# Patient Record
Sex: Female | Born: 1950 | Hispanic: Yes | Smoking: Never smoker
Health system: Southern US, Community
[De-identification: ages and names within clinical notes are randomized; demographics above are authoritative.]

## PROBLEM LIST (undated history)

## (undated) DIAGNOSIS — I5189 Other ill-defined heart diseases: Secondary | ICD-10-CM

## (undated) DIAGNOSIS — I209 Angina pectoris, unspecified: Secondary | ICD-10-CM

## (undated) DIAGNOSIS — J9601 Acute respiratory failure with hypoxia: Secondary | ICD-10-CM

## (undated) DIAGNOSIS — I2510 Atherosclerotic heart disease of native coronary artery without angina pectoris: Secondary | ICD-10-CM

## (undated) DIAGNOSIS — R011 Cardiac murmur, unspecified: Secondary | ICD-10-CM

## (undated) DIAGNOSIS — E785 Hyperlipidemia, unspecified: Secondary | ICD-10-CM

## (undated) DIAGNOSIS — Z87442 Personal history of urinary calculi: Secondary | ICD-10-CM

## (undated) DIAGNOSIS — J45909 Unspecified asthma, uncomplicated: Secondary | ICD-10-CM

## (undated) DIAGNOSIS — G894 Chronic pain syndrome: Secondary | ICD-10-CM

## (undated) DIAGNOSIS — I272 Pulmonary hypertension, unspecified: Secondary | ICD-10-CM

## (undated) DIAGNOSIS — I259 Chronic ischemic heart disease, unspecified: Secondary | ICD-10-CM

## (undated) DIAGNOSIS — R160 Hepatomegaly, not elsewhere classified: Secondary | ICD-10-CM

## (undated) DIAGNOSIS — I38 Endocarditis, valve unspecified: Secondary | ICD-10-CM

## (undated) DIAGNOSIS — M329 Systemic lupus erythematosus, unspecified: Secondary | ICD-10-CM

## (undated) DIAGNOSIS — IMO0002 Reserved for concepts with insufficient information to code with codable children: Secondary | ICD-10-CM

## (undated) DIAGNOSIS — Z8619 Personal history of other infectious and parasitic diseases: Secondary | ICD-10-CM

## (undated) DIAGNOSIS — M810 Age-related osteoporosis without current pathological fracture: Secondary | ICD-10-CM

## (undated) DIAGNOSIS — T402X1A Poisoning by other opioids, accidental (unintentional), initial encounter: Secondary | ICD-10-CM

## (undated) DIAGNOSIS — Z9884 Bariatric surgery status: Secondary | ICD-10-CM

## (undated) DIAGNOSIS — R0600 Dyspnea, unspecified: Secondary | ICD-10-CM

## (undated) DIAGNOSIS — M5010 Cervical disc disorder with radiculopathy, unspecified cervical region: Secondary | ICD-10-CM

## (undated) DIAGNOSIS — I240 Acute coronary thrombosis not resulting in myocardial infarction: Secondary | ICD-10-CM

## (undated) DIAGNOSIS — G709 Myoneural disorder, unspecified: Secondary | ICD-10-CM

## (undated) DIAGNOSIS — R42 Dizziness and giddiness: Secondary | ICD-10-CM

## (undated) DIAGNOSIS — J449 Chronic obstructive pulmonary disease, unspecified: Secondary | ICD-10-CM

## (undated) DIAGNOSIS — I2089 Other forms of angina pectoris: Secondary | ICD-10-CM

## (undated) DIAGNOSIS — I6789 Other cerebrovascular disease: Secondary | ICD-10-CM

## (undated) DIAGNOSIS — M359 Systemic involvement of connective tissue, unspecified: Secondary | ICD-10-CM

## (undated) DIAGNOSIS — K449 Diaphragmatic hernia without obstruction or gangrene: Secondary | ICD-10-CM

## (undated) DIAGNOSIS — G4700 Insomnia, unspecified: Secondary | ICD-10-CM

## (undated) DIAGNOSIS — F32A Depression, unspecified: Secondary | ICD-10-CM

## (undated) DIAGNOSIS — M1990 Unspecified osteoarthritis, unspecified site: Secondary | ICD-10-CM

## (undated) DIAGNOSIS — F329 Major depressive disorder, single episode, unspecified: Secondary | ICD-10-CM

## (undated) DIAGNOSIS — K219 Gastro-esophageal reflux disease without esophagitis: Secondary | ICD-10-CM

## (undated) DIAGNOSIS — E559 Vitamin D deficiency, unspecified: Secondary | ICD-10-CM

## (undated) DIAGNOSIS — E039 Hypothyroidism, unspecified: Secondary | ICD-10-CM

## (undated) DIAGNOSIS — D509 Iron deficiency anemia, unspecified: Secondary | ICD-10-CM

## (undated) DIAGNOSIS — M5136 Other intervertebral disc degeneration, lumbar region: Secondary | ICD-10-CM

## (undated) DIAGNOSIS — Z8616 Personal history of COVID-19: Secondary | ICD-10-CM

## (undated) DIAGNOSIS — I10 Essential (primary) hypertension: Secondary | ICD-10-CM

## (undated) DIAGNOSIS — G4730 Sleep apnea, unspecified: Secondary | ICD-10-CM

## (undated) DIAGNOSIS — N289 Disorder of kidney and ureter, unspecified: Secondary | ICD-10-CM

## (undated) DIAGNOSIS — E079 Disorder of thyroid, unspecified: Secondary | ICD-10-CM

## (undated) HISTORY — PX: APPENDECTOMY: SHX54

## (undated) HISTORY — PX: NISSEN FUNDOPLICATION: SHX2091

## (undated) HISTORY — PX: CORONARY ANGIOPLASTY WITH STENT PLACEMENT: SHX49

## (undated) HISTORY — PX: LITHOTRIPSY: SUR834

## (undated) HISTORY — PX: TUBAL LIGATION: SHX77

## (undated) HISTORY — PX: GASTRIC BYPASS: SHX52

## (undated) HISTORY — PX: BREAST BIOPSY: SHX20

## (undated) HISTORY — PX: BREAST LUMPECTOMY: SHX2

---

## 2016-01-27 ENCOUNTER — Emergency Department
Admission: EM | Admit: 2016-01-27 | Discharge: 2016-01-29 | Disposition: A | Discharge status: Home / Self Care | Payer: Medicaid Other | Attending: Emergency Medicine | Admitting: Emergency Medicine

## 2016-01-27 DIAGNOSIS — F329 Major depressive disorder, single episode, unspecified: Secondary | ICD-10-CM

## 2016-01-27 DIAGNOSIS — Z5181 Encounter for therapeutic drug level monitoring: Secondary | ICD-10-CM | POA: Insufficient documentation

## 2016-01-27 DIAGNOSIS — F29 Unspecified psychosis not due to a substance or known physiological condition: Secondary | ICD-10-CM | POA: Diagnosis not present

## 2016-01-27 DIAGNOSIS — F341 Dysthymic disorder: Secondary | ICD-10-CM | POA: Insufficient documentation

## 2016-01-27 DIAGNOSIS — F32A Depression, unspecified: Secondary | ICD-10-CM

## 2016-01-27 LAB — CBC WITH DIFFERENTIAL/PLATELET
Basophils Absolute: 0.1 10*3/uL (ref 0–0.1)
Basophils Relative: 1 %
Eosinophils Absolute: 0.1 10*3/uL (ref 0–0.7)
Eosinophils Relative: 2 %
HCT: 36.5 % (ref 35.0–47.0)
Hemoglobin: 12.3 g/dL (ref 12.0–16.0)
Lymphocytes Relative: 37 %
Lymphs Abs: 2.5 10*3/uL (ref 1.0–3.6)
MCH: 28.7 pg (ref 26.0–34.0)
MCHC: 33.9 g/dL (ref 32.0–36.0)
MCV: 84.8 fL (ref 80.0–100.0)
Monocytes Absolute: 0.3 10*3/uL (ref 0.2–0.9)
Monocytes Relative: 5 %
Neutro Abs: 3.9 10*3/uL (ref 1.4–6.5)
Neutrophils Relative %: 55 %
Platelets: 177 10*3/uL (ref 150–440)
RBC: 4.3 MIL/uL (ref 3.80–5.20)
RDW: 15.1 % — ABNORMAL HIGH (ref 11.5–14.5)
WBC: 7 10*3/uL (ref 3.6–11.0)

## 2016-01-27 MED ORDER — LORAZEPAM 2 MG/ML IJ SOLN
1.0000 mg | Freq: Once | INTRAMUSCULAR | Status: AC
  Administered 2016-01-27: 1 mg via INTRAVENOUS
  Filled 2016-01-27: qty 1
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
## 2016-01-27 NOTE — ED Triage Notes (Signed)
Interpreter at bedside. Pt presents to ED from home via EMS for excessive crying. Pt states her son is in [place] (he is doing fine), but she wants to go with "him"; has been crying ever since.

## 2016-01-27 NOTE — ED Notes (Signed)
Unable to assess patient at this time.  Interpreter present, but pt refusing to answer questions.  Pt just crying and screaming about missing her son.

## 2016-01-27 NOTE — ED Notes (Signed)
Interpreter at bedside. Pt states she has been crying since her granddaughter left her a "long time ago".

## 2016-01-28 LAB — COMPREHENSIVE METABOLIC PANEL
ALT: 13 U/L — ABNORMAL LOW (ref 14–54)
AST: 21 U/L (ref 15–41)
Albumin: 3.7 g/dL (ref 3.5–5.0)
Alkaline Phosphatase: 79 U/L (ref 38–126)
Anion gap: 6 (ref 5–15)
BUN: 14 mg/dL (ref 6–20)
CO2: 25 mmol/L (ref 22–32)
Calcium: 8.9 mg/dL (ref 8.9–10.3)
Chloride: 111 mmol/L (ref 101–111)
Creatinine, Ser: 0.55 mg/dL (ref 0.44–1.00)
GFR calc Af Amer: >60 mL/min (ref >60)
GFR calc non Af Amer: >60 mL/min (ref >60)
Glucose, Bld: 93 mg/dL (ref 65–99)
Potassium: 3.1 mmol/L — ABNORMAL LOW (ref 3.5–5.1)
Sodium: 142 mmol/L (ref 135–145)
Total Bilirubin: 0.6 mg/dL (ref 0.3–1.2)
Total Protein: 6.5 g/dL (ref 6.5–8.1)

## 2016-01-28 LAB — URINALYSIS COMPLETE WITH MICROSCOPIC (ARMC ONLY)
Bacteria, UA: NONE SEEN
Bilirubin Urine: NEGATIVE
Glucose, UA: NEGATIVE mg/dL
Hgb urine dipstick: NEGATIVE
Leukocytes, UA: NEGATIVE
Nitrite: NEGATIVE
Protein, ur: NEGATIVE mg/dL
Specific Gravity, Urine: 1.012 (ref 1.005–1.030)
pH: 6 (ref 5.0–8.0)

## 2016-01-28 LAB — URINE DRUG SCREEN, QUALITATIVE (ARMC ONLY)
Amphetamines, Ur Screen: NOT DETECTED
Barbiturates, Ur Screen: NOT DETECTED
Benzodiazepine, Ur Scrn: POSITIVE — AB
Cannabinoid 50 Ng, Ur ~~LOC~~: NOT DETECTED
Cocaine Metabolite,Ur ~~LOC~~: NOT DETECTED
MDMA (Ecstasy)Ur Screen: NOT DETECTED
Methadone Scn, Ur: NOT DETECTED
Opiate, Ur Screen: NOT DETECTED
Phencyclidine (PCP) Ur S: NOT DETECTED
Tricyclic, Ur Screen: NOT DETECTED

## 2016-01-28 LAB — ETHANOL: Alcohol, Ethyl (B): <5 mg/dL (ref <5)

## 2016-01-28 LAB — ACETAMINOPHEN LEVEL: Acetaminophen (Tylenol), Serum: <10 ug/mL — ABNORMAL LOW (ref 10–30)

## 2016-01-28 LAB — SALICYLATE LEVEL: Salicylate Lvl: <4 mg/dL (ref 2.8–30.0)

## 2016-01-28 MED ORDER — IBUPROFEN 800 MG PO TABS
ORAL_TABLET | ORAL | Status: AC
  Administered 2016-01-28: 800 mg via ORAL
  Filled 2016-01-28: qty 1

## 2016-01-28 MED ORDER — IBUPROFEN 800 MG PO TABS
800.0000 mg | ORAL_TABLET | Freq: Once | ORAL | Status: AC
  Administered 2016-01-28: 800 mg via ORAL

## 2016-01-28 MED ORDER — LEVOTHYROXINE SODIUM 50 MCG PO TABS: 75.0000 ug | ORAL_TABLET | Freq: Every day | ORAL | Status: DC

## 2016-01-28 MED ORDER — OXYCODONE-ACETAMINOPHEN 5-325 MG PO TABS
2.0000 | ORAL_TABLET | Freq: Once | ORAL | Status: AC
  Administered 2016-01-28: 2 via ORAL
  Filled 2016-01-28: qty 2

## 2016-01-28 MED ORDER — AMLODIPINE BESYLATE 5 MG PO TABS
5.0000 mg | ORAL_TABLET | Freq: Every day | ORAL | Status: DC
  Administered 2016-01-28: 5 mg via ORAL
  Filled 2016-01-28: qty 1

## 2016-01-28 MED ORDER — LORAZEPAM 2 MG PO TABS
2.0000 mg | ORAL_TABLET | Freq: Once | ORAL | Status: AC
  Administered 2016-01-28: 2 mg via ORAL
  Filled 2016-01-28: qty 1

## 2016-01-28 MED ORDER — GABAPENTIN 400 MG PO CAPS
800.0000 mg | ORAL_CAPSULE | Freq: Two times a day (BID) | ORAL | Status: DC
  Administered 2016-01-28 (×2): 800 mg via ORAL
  Filled 2016-01-28 (×3): qty 2

## 2016-01-28 MED ORDER — GABAPENTIN 800 MG PO TABS
800.0000 mg | ORAL_TABLET | Freq: Two times a day (BID) | ORAL | Status: DC
  Filled 2016-01-28 (×2): qty 1

## 2016-01-28 MED ORDER — HYDROXYCHLOROQUINE SULFATE 200 MG PO TABS
200.0000 mg | ORAL_TABLET | Freq: Two times a day (BID) | ORAL | Status: DC
  Administered 2016-01-28 (×2): 200 mg via ORAL
  Filled 2016-01-28 (×4): qty 1

## 2016-01-28 MED ORDER — LORAZEPAM 1 MG PO TABS
1.0000 mg | ORAL_TABLET | Freq: Once | ORAL | Status: AC
  Administered 2016-01-28: 1 mg via ORAL
  Filled 2016-01-28: qty 1
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
## 2016-01-28 NOTE — ED Notes (Signed)
Called [facility] for consult spoke to [facility]

## 2016-01-28 NOTE — ED Notes (Signed)
Jackie Plum [phone] is the granddaughter of the patient. Would like to be updated about patient when possible or called if patient needs anything.
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
## 2016-01-28 NOTE — ED Notes (Signed)
ENVIRONMENTAL ASSESSMENT  Potentially harmful objects out of patient reach: Yes.  Personal belongings secured: Yes.  Patient dressed in hospital provided attire only: Yes.  Plastic bags out of patient reach: Yes.  Patient care equipment (cords, cables, call bells, lines, and drains) shortened, removed, or accounted for: Yes.  Equipment and supplies removed from bottom of stretcher: Yes.  Potentially toxic materials out of patient reach: Yes.  Sharps container removed or out of patient reach: Yes.   BEHAVIORAL HEALTH ROUNDING  Patient sleeping: No.  Patient alert and oriented: yes  Behavior appropriate: Yes. ; If no, describe:  Nutrition and fluids offered: Yes  Toileting and hygiene offered: Yes  Sitter present: not applicable, Q 15 min safety rounds and observation.  Law enforcement present: Yes ODS  ED [place]  Is the patient under IVC or is there intent for IVC: Yes.  Is the patient medically cleared: Yes.  Is there vacancy in the ED BHU: Yes.  Is the population mix appropriate for patient: Yes.  Is the patient awaiting placement in inpatient or outpatient setting: Yes.  Has the patient had a psychiatric consult: Pending Survey of unit performed for contraband, proper placement and condition of furniture, tampering with fixtures in bathroom, shower, and each patient room: Yes. ; Findings: All clear  APPEARANCE/BEHAVIOR  calm, cooperative and adequate rapport can be established  NEURO ASSESSMENT  Orientation: time, place and person  Hallucinations: No.None noted (Hallucinations)  Speech: Normal  Gait: normal  RESPIRATORY ASSESSMENT  WNL  CARDIOVASCULAR ASSESSMENT  WNL  GASTROINTESTINAL ASSESSMENT  WNL  EXTREMITIES  WNL  PLAN OF CARE  Provide calm/safe environment. Vital signs assessed twice daily. ED BHU Assessment once each 12-hour shift. Collaborate with TTS daily or as condition indicates. Assure the ED provider has rounded once each shift. Provide  and encourage hygiene. Provide redirection as needed. Assess for escalating behavior; address immediately and inform ED provider.  Assess family dynamic and appropriateness for visitation as needed: Yes. ; If necessary, describe findings:  Educate the patient/family about BHU procedures/visitation: Yes. ; If necessary, describe findings: Pt is calm and cooperative at this time. Pt understanding and accepting of unit procedures/rules. Will continue to monitor with Q 15 min safety rounds and observation.

## 2016-01-28 NOTE — ED Notes (Signed)
Pt dressed out into hospital scrubs.
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
## 2016-01-28 NOTE — ED Notes (Signed)
BEHAVIORAL HEALTH ROUNDING  Patient sleeping: Yes Patient alert and oriented: Sleeping Behavior appropriate: Yes. ; If no, describe:  Nutrition and fluids offered: No, sleeping  Toileting and hygiene offered: No, sleeping  Sitter present: q15 minute observations and security monitoring  Law enforcement present: Yes ODS 

## 2016-01-28 NOTE — BH Assessment (Signed)
Contacted [facility] to perform tele-assessment. Mila Merry said Pt is too drowsy to participate in assessment.   Orpah Greek Anson Fret, LPC, [facility] Triage Specialist [phone]

## 2016-01-28 NOTE — ED Notes (Signed)
Pt ambulated to bathroom with walker and tech assistance. Urine collected pt and pt back to bed.

## 2016-01-28 NOTE — ED Notes (Signed)
Granddaughter Stanton Kidney called checking on patient informed her that we did not have her contact number.  Obtained contact info and notified SW.  Phone is [phone]

## 2016-01-28 NOTE — ED Notes (Addendum)
Pt continues to be tearful throughout the day.  Pt upset and wants to be with her son instead of her 62 year old granddaughter who just had a child. Pt also reports that she has applied for housing for herself. Pt rolled to room 36 for consult at this time on the computer screen.

## 2016-01-28 NOTE — ED Notes (Signed)
Pt given lunch tray.

## 2016-01-28 NOTE — ED Notes (Signed)
Pt continues to be tearful while lying on stretcher.  Discussed with Dr. Reita Cliche that medications have been reconciled in computer.  New order given for Ativan 1mg  PO at this time for anxiety.

## 2016-01-28 NOTE — ED Notes (Signed)
Pt is resting on stretcher in hallway.  Respirations equal and unlabored at this time.

## 2016-01-28 NOTE — ED Provider Notes (Signed)
-----------------------------------------   6:17 AM on 01/28/2016 -----------------------------------------   Blood pressure 134/74, pulse (!) 105, temperature 98.2 F (36.8 C), temperature source Oral, SpO2 100 %.  The patient had no acute events since last update.  Calm and cooperative at this time.  Disposition is pending Psychiatry/Behavioral Medicine team recommendations.     Paulette Blanch, MD 01/28/16 [phone]

## 2016-01-28 NOTE — ED Notes (Signed)
TTS on the computer screen at this time.
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
## 2016-01-28 NOTE — Clinical Social Work Note (Signed)
Clinical Social Work Assessment  Patient Details  Name: Shelly Freeman MRN: [id number] Date of Birth: Jan 11, 1951  Date of referral:  01/28/16               Reason for consult:  Discharge Planning, Emotional/Coping/Adjustment to Illness                Permission sought to share information with:  Facility Sport and exercise psychologist, Family Supports Permission granted to share information::  Yes, Verbal Permission Granted  Name::        Agency::     Relationship::     Contact Information:  Grand daughter [name]- Phone number unknown  Housing/Transportation Living arrangements for the past 2 months:  [place] (With Granddaughter) Source of Information:  Patient Patient Interpreter Needed:  Spanish Criminal Activity/Legal Involvement Pertinent to Current Situation/Hospitalization:  No - Comment as needed Significant Relationships:  Adult Children, Other Family Members Lives with:  Relatives Do you feel safe going back to the place where you live?  Yes Need for family participation in patient care:  Yes (Comment)  Care giving concerns:  TBD unable to reach daughter   Facilities manager / plan: LCSW met with ED nurse and Interpreter and collected information to complete a SW assessment. We were attempting to determine if patient is psychiatric or medical issues. It appears she does have some anxiety but reports she is not homicidal or suicidal. She was in [place] and moved to [facility] and now resides here in [place] for the last 2 months.. She has medicaid and has applied for housing. She is living with her grand daughter [name] unable to reach her, she reports she yells at her but there are no protection issues. Her only living son is in [place]. Patient is IVC yet its her first visit at this hospital. She went missing a week ago. This was not clear to all the parties involved. Patient has [place] medicaid and has applied for housing at [place] but does live with her granddaughter. She  would love to live in [place] with her only son. Support was provided and she will need a psychiatric assessment. She was IVC and brought in by police.  Employment status:  Retired Forensic scientist:  [place] PT Recommendations:  No Follow Up Information / Referral to community resources:  Other (Comment Required), Outpatient Psychiatric Care (Comment Required)  Patient/Family's Response to care:  TBD  Patient/Family's Understanding of and Emotional Response to Diagnosis, Current Treatment, and Prognosis: TBD  Emotional Assessment Appearance:  Appears stated age Attitude/Demeanor/Rapport:  Crying, Grieving Affect (typically observed):  Grieving, Apprehensive, Tearful/Crying Orientation:  Oriented to Self, Oriented to Place Alcohol / Substance use:  Never Used Psych involvement (Current and /or in the community):  Yes (Comment) (for her nerves)  Discharge Needs  Concerns to be addressed:  Coping/Stress Concerns, Discharge Planning Concerns Readmission within the last 30 days:    Current discharge risk:  Psychiatric Illness, Lack of support system Barriers to Discharge:  Family Issues, Continued Medical Work up   [place], LCSW 01/28/2016, 12:11 PM

## 2016-01-28 NOTE — Progress Notes (Signed)
LCSW met with ED nurse and Interpreter and collected information to complete a SW assessment. We were attempting to determine if patient is psychiatric or medical issues. It appears she does have some anxiety but reports she is not homicidal or suicidal. She was in [place] and moved to [facility] and now resides here in [name]. She has medicaid and has applied for housing. She is living with her grand daughter [name] unable to reach her, she reports she yells at her but there are no protection issues. Her only living son is in [place]. Patient is IVC yet its her first visit at this hospital. She went missing a week ago-  ED nurse will consult with EDP and go from there.  [employer] LCSW [phone]

## 2016-01-29 MED ORDER — TRAZODONE HCL 50 MG PO TABS: 50.0000 mg | ORAL_TABLET | Freq: Every day | ORAL | 0 refills | Status: DC

## 2016-01-29 MED ORDER — BUPROPION HCL ER (XL) 150 MG PO TB24: 150.0000 mg | ORAL_TABLET | Freq: Every day | ORAL | 0 refills | Status: DC

## 2016-01-29 NOTE — Discharge Instructions (Signed)
1. The psychiatrist recommends that you start the following medications: Trazodone 50 mg by mouth nightly Wellbutrin XL 150 mg by mouth every morning 2. Return to the ER for worsening symptoms, feelings of hurting yourself or others, or other concerns.

## 2016-01-29 NOTE — ED Notes (Signed)
Interpreter Rafel in room with pt for [facility] consult.

## 2016-01-29 NOTE — ED Notes (Signed)
BEHAVIORAL HEALTH ROUNDING  Patient sleeping: No.  Patient alert and oriented: yes  Behavior appropriate: Yes. ; If no, describe:  Nutrition and fluids offered: Yes  Toileting and hygiene offered: Yes  Sitter present: not applicable, Q 15 min safety rounds and observation.  Law enforcement present: Yes ODS  

## 2016-01-29 NOTE — ED Notes (Signed)
Discharge instructions given via stick with [place] [id number].

## 2016-01-29 NOTE — ED Notes (Signed)
Taxi called to take patient home. Granddaughter called to inform of this and pt discharge.

## 2016-01-29 NOTE — ED Notes (Signed)
BEHAVIORAL HEALTH ROUNDING Patient sleeping: Yes.   Patient alert and oriented: not applicable SLEEPING Behavior appropriate: Yes.  ; If no, describe: SLEEPING Nutrition and fluids offered: No SLEEPING Toileting and hygiene offered: NoSLEEPING Sitter present: not applicable, Q 15 min safety rounds and observation. Law enforcement present: Yes ODS 

## 2016-01-29 NOTE — ED Provider Notes (Signed)
-----------------------------------------   2:35 AM on 01/29/2016 -----------------------------------------  Patient was evaluated by [facility] psychiatry who has rescinded her IVC. Deems the patient is psychiatrically stable for discharge to home with follow-up to an outpatient psychiatrist. He has asked me to provide a two-week prescription of trazodone 50 mg at night and Wellbutrin XL 150 mg in the morning. Strict return precautions given. Patient verbalizes understanding and agrees with plan of care   Paulette Blanch, MD 01/29/16 [phone]

## 2016-02-02 ENCOUNTER — Emergency Department
Admission: EM | Admit: 2016-02-02 | Discharge: 2016-02-02 | Disposition: A | Discharge status: Home / Self Care | Payer: Medicaid Other | Attending: Emergency Medicine | Admitting: Emergency Medicine

## 2016-02-02 ENCOUNTER — Encounter: Payer: Self-pay | Admitting: Emergency Medicine

## 2016-02-02 DIAGNOSIS — R112 Nausea with vomiting, unspecified: Secondary | ICD-10-CM | POA: Insufficient documentation

## 2016-02-02 DIAGNOSIS — Z79899 Other long term (current) drug therapy: Secondary | ICD-10-CM | POA: Insufficient documentation

## 2016-02-02 DIAGNOSIS — I10 Essential (primary) hypertension: Secondary | ICD-10-CM | POA: Insufficient documentation

## 2016-02-02 HISTORY — DX: Depression, unspecified: F32.A

## 2016-02-02 HISTORY — DX: Disorder of thyroid, unspecified: E07.9

## 2016-02-02 HISTORY — DX: Major depressive disorder, single episode, unspecified: F32.9

## 2016-02-02 HISTORY — DX: Reserved for concepts with insufficient information to code with codable children: IMO0002

## 2016-02-02 HISTORY — DX: Endocarditis, valve unspecified: I38

## 2016-02-02 HISTORY — DX: Essential (primary) hypertension: I10

## 2016-02-02 HISTORY — DX: Systemic lupus erythematosus, unspecified: M32.9

## 2016-02-02 HISTORY — DX: Unspecified osteoarthritis, unspecified site: M19.90

## 2016-02-02 LAB — COMPREHENSIVE METABOLIC PANEL
ALT: 13 U/L — ABNORMAL LOW (ref 14–54)
AST: 19 U/L (ref 15–41)
Albumin: 4 g/dL (ref 3.5–5.0)
Alkaline Phosphatase: 90 U/L (ref 38–126)
Anion gap: 5 (ref 5–15)
BUN: 17 mg/dL (ref 6–20)
CO2: 27 mmol/L (ref 22–32)
Calcium: 8.9 mg/dL (ref 8.9–10.3)
Chloride: 108 mmol/L (ref 101–111)
Creatinine, Ser: 0.66 mg/dL (ref 0.44–1.00)
GFR calc Af Amer: >60 mL/min (ref >60)
GFR calc non Af Amer: >60 mL/min (ref >60)
Glucose, Bld: 92 mg/dL (ref 65–99)
Potassium: 4.2 mmol/L (ref 3.5–5.1)
Sodium: 140 mmol/L (ref 135–145)
Total Bilirubin: 0.5 mg/dL (ref 0.3–1.2)
Total Protein: 6.8 g/dL (ref 6.5–8.1)

## 2016-02-02 LAB — URINALYSIS COMPLETE WITH MICROSCOPIC (ARMC ONLY)
Bacteria, UA: NONE SEEN
Bilirubin Urine: NEGATIVE
Glucose, UA: NEGATIVE mg/dL
Hgb urine dipstick: NEGATIVE
Ketones, ur: NEGATIVE mg/dL
Leukocytes, UA: NEGATIVE
Nitrite: NEGATIVE
Protein, ur: NEGATIVE mg/dL
Specific Gravity, Urine: 1.023 (ref 1.005–1.030)
pH: 5 (ref 5.0–8.0)

## 2016-02-02 LAB — CBC
HCT: 37.3 % (ref 35.0–47.0)
Hemoglobin: 12.6 g/dL (ref 12.0–16.0)
MCH: 28.8 pg (ref 26.0–34.0)
MCHC: 33.9 g/dL (ref 32.0–36.0)
MCV: 85.1 fL (ref 80.0–100.0)
Platelets: 168 10*3/uL (ref 150–440)
RBC: 4.38 MIL/uL (ref 3.80–5.20)
RDW: 15.4 % — ABNORMAL HIGH (ref 11.5–14.5)
WBC: 6.3 10*3/uL (ref 3.6–11.0)

## 2016-02-02 LAB — INFLUENZA PANEL BY PCR (TYPE A & B)
H1N1 flu by pcr: NOT DETECTED
Influenza A By PCR: NEGATIVE
Influenza B By PCR: NEGATIVE

## 2016-02-02 LAB — TROPONIN I: Troponin I: <0.03 ng/mL (ref <0.03)

## 2016-02-02 LAB — LIPASE, BLOOD: Lipase: 23 U/L (ref 11–51)

## 2016-02-02 MED ORDER — ONDANSETRON 4 MG PO TBDP
ORAL_TABLET | ORAL | Status: AC
  Filled 2016-02-02: qty 1

## 2016-02-02 MED ORDER — ONDANSETRON HCL 4 MG PO TABS: 4.0000 mg | ORAL_TABLET | Freq: Three times a day (TID) | ORAL | 0 refills | Status: DC | PRN

## 2016-02-02 MED ORDER — IBUPROFEN 400 MG PO TABS
600.0000 mg | ORAL_TABLET | Freq: Once | ORAL | Status: AC
  Administered 2016-02-02: 600 mg via ORAL
  Filled 2016-02-02: qty 2

## 2016-02-02 MED ORDER — SODIUM CHLORIDE 0.9 % IV BOLUS (SEPSIS)
1000.0000 mL | Freq: Once | INTRAVENOUS | Status: AC
  Administered 2016-02-02: 1000 mL via INTRAVENOUS

## 2016-02-02 MED ORDER — ONDANSETRON HCL 4 MG/2ML IJ SOLN
4.0000 mg | Freq: Once | INTRAMUSCULAR | Status: AC
  Administered 2016-02-02: 4 mg via INTRAVENOUS
  Filled 2016-02-02: qty 2

## 2016-02-02 MED ORDER — ONDANSETRON 4 MG PO TBDP: 4.0000 mg | ORAL_TABLET | Freq: Once | ORAL | Status: DC

## 2016-02-02 MED ORDER — HYDROXYCHLOROQUINE SULFATE 200 MG PO TABS
200.0000 mg | ORAL_TABLET | Freq: Once | ORAL | Status: AC
Start: 2016-02-02 — End: 2016-02-02
  Administered 2016-02-02: 200 mg via ORAL
  Filled 2016-02-02: qty 1

## 2016-02-02 MED ORDER — MORPHINE SULFATE (PF) 4 MG/ML IV SOLN
4.0000 mg | Freq: Once | INTRAVENOUS | Status: AC
  Administered 2016-02-02: 4 mg via INTRAVENOUS
  Filled 2016-02-02: qty 1

## 2016-02-02 MED ORDER — HYDROXYCHLOROQUINE SULFATE 200 MG PO TABS: 200.0000 mg | ORAL_TABLET | Freq: Two times a day (BID) | ORAL | 0 refills | Status: DC

## 2016-02-02 NOTE — Discharge Instructions (Signed)
You were evaluated for body aches and vomiting, and although no certain cause was found, your exam and evaluation are reassuring today. I am suspicious you may have a virus, and you are being treated with nausea medication, and a refill on your plaquinil. You may take over-the-counter ibuprofen and/or Tylenol as directed on label for body aches.  Return to the emergency room for any worsening symptoms including trouble breathing, vomiting blood, dizziness or passing out, or worsening abdominal pain or any other symptoms concerning to you.  2 doctor's offices are provided, you need to call and make an appointment for a follow-up appointment this next week.

## 2016-02-02 NOTE — ED Notes (Signed)
MD at bedside. 

## 2016-02-02 NOTE — ED Triage Notes (Signed)
Pt c/o epigastric pain and vomiting. Has had generalized body aches and sore throat as well.  Increased frequency of urine and dysuria.

## 2016-02-02 NOTE — ED Provider Notes (Signed)
[facility] Emergency Department Provider Note ____________________________________________   I have reviewed the triage vital signs and the triage nursing note.  HISTORY  Chief Complaint Emesis   Historian Patient, through [place] interpreter Granddaughter available at discharge too  HPI Shelly Freeman is a 65 y.o. female with a history of depression and lupus and apparently prior psychiatric hospitalizations presents today with a 2 day history of midepigastric pain associated with nausea, nonbloody nonbilious emesis, and what sounds like maybe some diarrhea as well. Denies fever but states that she's had body aches all over.  She was recently in the emergency department overnight due to depression symptoms and was released after seeing psychiatry with a change in medication, adding trazodone at night.  She states that she feels okay with her mood, and no suicidal ideation.    Past Medical History:  Diagnosis Date  . Arthritis   . Depression   . Heart valve problem    per grandaughter need surgery but they told her may not survive;  [name] Hypertension   . Lupus ([place])   . Thyroid disease     There are no active problems to display for this patient.   Past Surgical History:  Procedure Laterality Date  . APPENDECTOMY    . BREAST LUMPECTOMY    . CESAREAN SECTION    . GASTRIC BYPASS    . TUBAL LIGATION      Prior to Admission medications   Medication Sig Start Date End Date Taking? Authorizing Provider  amLODipine (NORVASC) 5 MG tablet Take 5 mg by mouth daily.    Historical Provider, MD  buPROPion (WELLBUTRIN XL) 150 MG 24 hr tablet Take 1 tablet (150 mg total) by mouth daily. 01/29/16   Paulette Blanch, MD  gabapentin (NEURONTIN) 800 MG tablet Take 800 mg by mouth 2 (two) times daily.    Historical Provider, MD  hydroxychloroquine (PLAQUENIL) 200 MG tablet Take 1 tablet (200 mg total) by mouth 2 (two) times daily. 02/02/16 02/01/17  Lisa Roca, MD   ibuprofen (ADVIL,MOTRIN) 800 MG tablet Take 800 mg by mouth every 8 (eight) hours as needed (pain).    Historical Provider, MD  levothyroxine (SYNTHROID, LEVOTHROID) 75 MCG tablet Take 75 mcg by mouth daily before breakfast.    Historical Provider, MD  LORazepam (ATIVAN) 2 MG tablet Take 2 mg by mouth 2 (two) times daily as needed for anxiety.    Historical Provider, MD  ondansetron (ZOFRAN) 4 MG tablet Take 1 tablet (4 mg total) by mouth every 8 (eight) hours as needed for nausea or vomiting. 02/02/16   Lisa Roca, MD  traZODone (DESYREL) 50 MG tablet Take 1 tablet (50 mg total) by mouth at bedtime. 01/29/16   Paulette Blanch, MD  Vitamin D, Ergocalciferol, (DRISDOL) 50000 units CAPS capsule Take 50,000 Units by mouth every 7 (seven) days.    Historical Provider, MD    No Known Allergies  History reviewed. No pertinent family history.  Social History Social History  Substance Use Topics  . Smoking status: Never Smoker  . Smokeless tobacco: Not on file  . Alcohol use No    Review of Systems  Constitutional: Negative for fever. Eyes: Negative for visual changes. ENT: Positive for sore throat. Cardiovascular: Negative for chest pain. Respiratory: Negative for shortness of breath. Gastrointestinal: As per history of present illness Genitourinary: Negative for dysuria. Musculoskeletal: Negative for back pain. Skin: Negative for rash. Neurological: Negative for headache. 10 point Review of Systems otherwise negative ____________________________________________  PHYSICAL EXAM:  VITAL SIGNS: ED Triage Vitals [02/02/16 1400]  Enc Vitals Group     BP (!) 107/58     Pulse Rate 63     Resp 18     Temp 98.2 F (36.8 C)     Temp Source Oral     SpO2 96 %     Weight 160 lb (72.6 kg)     Height 5\' 1"  (1.549 m)     Head Circumference      Peak Flow      Pain Score 9     Pain Loc      Pain Edu?      Excl. in [place]?      Constitutional: Alert and oriented, but moaning and holding  her mid abdomen. HEENT   Head: Normocephalic and atraumatic.      Eyes: Conjunctivae are normal. PERRL. Normal extraocular movements.      Ears:         Nose: No congestion/rhinnorhea.   Mouth/Throat: Mucous membranes are moist. Mild posterior pharyngeal erythema.   Neck: No stridor. Cardiovascular/Chest: Normal rate, regular rhythm.  No murmurs, rubs, or gallops. Respiratory: Normal respiratory effort without tachypnea nor retractions. Breath sounds are clear and equal bilaterally. No wheezes/rales/rhonchi. Gastrointestinal: Soft. No distention, no guarding, no rebound. Moderate tenderness to palpation in the epigastrium.  Genitourinary/rectal:Deferred Musculoskeletal: Nontender with normal range of motion in all extremities. No joint effusions.  No lower extremity tenderness.  No edema. Neurologic:  No slurred speech or facial droop. No gross or focal neurologic deficits are appreciated. Skin:  Skin is warm, dry and intact. No rash noted. Psychiatric: Tearful, seems to be related to discomfort. No acute psychosis.   ____________________________________________  LABS (pertinent positives/negatives)  Labs Reviewed  COMPREHENSIVE METABOLIC PANEL - Abnormal; Notable for the following:       Result Value   ALT 13 (*)    All other components within normal limits  CBC - Abnormal; Notable for the following:    RDW 15.4 (*)    All other components within normal limits  URINALYSIS COMPLETEWITH MICROSCOPIC ([facility] ONLY) - Abnormal; Notable for the following:    Color, Urine YELLOW (*)    APPearance CLEAR (*)    Squamous Epithelial / LPF 0-5 (*)    All other components within normal limits  LIPASE, BLOOD  TROPONIN I  INFLUENZA PANEL BY PCR (TYPE A & B, H1N1)    ____________________________________________    EKG I, Lisa Roca, MD, the attending physician have personally viewed and interpreted all ECGs.  66 beats per minute. normal sinus rhythm. Narrow QRS normal axis.  Normal ST and T-wave ____________________________________________  RADIOLOGY All Xrays were viewed by me. Imaging interpreted by Radiologist.  None __________________________________________  PROCEDURES  Procedure(s) performed: None  Critical Care performed: None  ____________________________________________   ED COURSE / ASSESSMENT AND PLAN  Pertinent labs & imaging results that were available during my care of the patient were reviewed by me and considered in my medical decision making (see chart for details).   Ms. Salena Saner is here for abdominal pain, nausea and vomiting as well as body aches and sore throat which may be related to a viral syndrome.  She was treated with IV normal saline bolus, morphine and Zofran and ibuprofen for pain as well as a dose of her home medication plaque without which she has been out of. She ran out about a week prior to her ED stay, in which case she received 2 days'  worth, and then went home and has been out of it now for a few days. I am going to provide her 2 weeks' worth and she is to follow-up with a primary care physician, as she is new to the area from [place].  Staying with her granddaughter.  Feels better, resolved, after symptomatic treatment.  Granddaughter here and states she is doing ok, will be following up with primary care and I will refer to [place] as well.    CONSULTATIONS:  None   Patient / Family / Caregiver informed of clinical course, medical decision-making process, and agree with plan.   I discussed return precautions, follow-up instructions, and discharge instructions with patient and/or family.   ___________________________________________   FINAL CLINICAL IMPRESSION(S) / ED DIAGNOSES   Final diagnoses:  Non-intractable vomiting with nausea, vomiting of unspecified type              Note: This dictation was prepared with Dragon dictation. Any transcriptional errors that result from this process  are unintentional    Lisa Roca, MD 02/02/16 2001

## 2016-03-19 DIAGNOSIS — M79601 Pain in right arm: Secondary | ICD-10-CM | POA: Insufficient documentation

## 2016-03-19 DIAGNOSIS — M545 Low back pain, unspecified: Secondary | ICD-10-CM | POA: Insufficient documentation

## 2016-03-19 DIAGNOSIS — G8929 Other chronic pain: Secondary | ICD-10-CM | POA: Insufficient documentation

## 2016-03-19 DIAGNOSIS — M170 Bilateral primary osteoarthritis of knee: Secondary | ICD-10-CM | POA: Insufficient documentation

## 2016-03-19 DIAGNOSIS — Z79899 Other long term (current) drug therapy: Secondary | ICD-10-CM | POA: Insufficient documentation

## 2016-03-19 DIAGNOSIS — M810 Age-related osteoporosis without current pathological fracture: Secondary | ICD-10-CM | POA: Insufficient documentation

## 2016-03-30 ENCOUNTER — Inpatient Hospital Stay
Admission: EM | Admit: 2016-03-30 | Discharge: 2016-04-03 | DRG: 917 | Disposition: A | Discharge status: Home / Self Care | Payer: Medicaid Other | Attending: Internal Medicine | Admitting: Internal Medicine

## 2016-03-30 ENCOUNTER — Emergency Department: Payer: Medicaid Other

## 2016-03-30 DIAGNOSIS — M79609 Pain in unspecified limb: Secondary | ICD-10-CM

## 2016-03-30 DIAGNOSIS — Z9884 Bariatric surgery status: Secondary | ICD-10-CM | POA: Diagnosis not present

## 2016-03-30 DIAGNOSIS — F333 Major depressive disorder, recurrent, severe with psychotic symptoms: Secondary | ICD-10-CM | POA: Diagnosis not present

## 2016-03-30 DIAGNOSIS — Z79899 Other long term (current) drug therapy: Secondary | ICD-10-CM

## 2016-03-30 DIAGNOSIS — M797 Fibromyalgia: Secondary | ICD-10-CM | POA: Diagnosis present

## 2016-03-30 DIAGNOSIS — T391X2A Poisoning by 4-Aminophenol derivatives, intentional self-harm, initial encounter: Secondary | ICD-10-CM | POA: Diagnosis not present

## 2016-03-30 DIAGNOSIS — M170 Bilateral primary osteoarthritis of knee: Secondary | ICD-10-CM | POA: Diagnosis present

## 2016-03-30 DIAGNOSIS — Z7901 Long term (current) use of anticoagulants: Secondary | ICD-10-CM | POA: Diagnosis not present

## 2016-03-30 DIAGNOSIS — G9341 Metabolic encephalopathy: Secondary | ICD-10-CM | POA: Diagnosis present

## 2016-03-30 DIAGNOSIS — M19042 Primary osteoarthritis, left hand: Secondary | ICD-10-CM | POA: Diagnosis present

## 2016-03-30 DIAGNOSIS — T40602A Poisoning by unspecified narcotics, intentional self-harm, initial encounter: Principal | ICD-10-CM | POA: Diagnosis present

## 2016-03-30 DIAGNOSIS — E890 Postprocedural hypothyroidism: Secondary | ICD-10-CM | POA: Diagnosis present

## 2016-03-30 DIAGNOSIS — M329 Systemic lupus erythematosus, unspecified: Secondary | ICD-10-CM | POA: Diagnosis present

## 2016-03-30 DIAGNOSIS — J9601 Acute respiratory failure with hypoxia: Secondary | ICD-10-CM

## 2016-03-30 DIAGNOSIS — K219 Gastro-esophageal reflux disease without esophagitis: Secondary | ICD-10-CM | POA: Diagnosis present

## 2016-03-30 DIAGNOSIS — T450X2A Poisoning by antiallergic and antiemetic drugs, intentional self-harm, initial encounter: Secondary | ICD-10-CM | POA: Diagnosis present

## 2016-03-30 DIAGNOSIS — M25562 Pain in left knee: Secondary | ICD-10-CM

## 2016-03-30 DIAGNOSIS — J449 Chronic obstructive pulmonary disease, unspecified: Secondary | ICD-10-CM | POA: Diagnosis present

## 2016-03-30 DIAGNOSIS — M19041 Primary osteoarthritis, right hand: Secondary | ICD-10-CM | POA: Diagnosis present

## 2016-03-30 DIAGNOSIS — Z7983 Long term (current) use of bisphosphonates: Secondary | ICD-10-CM

## 2016-03-30 DIAGNOSIS — T50902A Poisoning by unspecified drugs, medicaments and biological substances, intentional self-harm, initial encounter: Secondary | ICD-10-CM

## 2016-03-30 DIAGNOSIS — M79605 Pain in left leg: Secondary | ICD-10-CM

## 2016-03-30 DIAGNOSIS — R4189 Other symptoms and signs involving cognitive functions and awareness: Secondary | ICD-10-CM

## 2016-03-30 DIAGNOSIS — I10 Essential (primary) hypertension: Secondary | ICD-10-CM | POA: Diagnosis present

## 2016-03-30 DIAGNOSIS — E876 Hypokalemia: Secondary | ICD-10-CM | POA: Diagnosis not present

## 2016-03-30 DIAGNOSIS — E039 Hypothyroidism, unspecified: Secondary | ICD-10-CM

## 2016-03-30 DIAGNOSIS — M6281 Muscle weakness (generalized): Secondary | ICD-10-CM

## 2016-03-30 HISTORY — DX: Myoneural disorder, unspecified: G70.9

## 2016-03-30 HISTORY — DX: Hypothyroidism, unspecified: E03.9

## 2016-03-30 LAB — CBC WITH DIFFERENTIAL/PLATELET
Basophils Absolute: 0 10*3/uL (ref 0–0.1)
Basophils Relative: 1 %
Eosinophils Absolute: 0.1 10*3/uL (ref 0–0.7)
Eosinophils Relative: 2 %
HCT: 38.3 % (ref 35.0–47.0)
Hemoglobin: 12.6 g/dL (ref 12.0–16.0)
Lymphocytes Relative: 43 %
Lymphs Abs: 3.1 10*3/uL (ref 1.0–3.6)
MCH: 29.4 pg (ref 26.0–34.0)
MCHC: 33 g/dL (ref 32.0–36.0)
MCV: 89.2 fL (ref 80.0–100.0)
Monocytes Absolute: 0.5 10*3/uL (ref 0.2–0.9)
Monocytes Relative: 7 %
Neutro Abs: 3.5 10*3/uL (ref 1.4–6.5)
Neutrophils Relative %: 47 %
Platelets: 206 10*3/uL (ref 150–440)
RBC: 4.29 MIL/uL (ref 3.80–5.20)
RDW: 16.3 % — ABNORMAL HIGH (ref 11.5–14.5)
WBC: 7.2 10*3/uL (ref 3.6–11.0)

## 2016-03-30 LAB — PROTIME-INR
INR: 0.97
Prothrombin Time: 12.9 seconds (ref 11.4–15.2)

## 2016-03-30 LAB — URINALYSIS COMPLETE WITH MICROSCOPIC (ARMC ONLY)
Bacteria, UA: NONE SEEN
Bilirubin Urine: NEGATIVE
Glucose, UA: NEGATIVE mg/dL
Hgb urine dipstick: NEGATIVE
Leukocytes, UA: NEGATIVE
Nitrite: NEGATIVE
Protein, ur: NEGATIVE mg/dL
Specific Gravity, Urine: 1.013 (ref 1.005–1.030)
pH: 7 (ref 5.0–8.0)

## 2016-03-30 LAB — BLOOD GAS, ARTERIAL
Acid-Base Excess: 3.5 mmol/L — ABNORMAL HIGH (ref 0.0–2.0)
Bicarbonate: 25.1 mmol/L (ref 20.0–28.0)
FIO2: 0.4
MECHVT: 420 mL
O2 Saturation: 99.8 %
PEEP: 5 cmH2O
Patient temperature: 37
RATE: 18 resp/min
pCO2 arterial: 28 mmHg — ABNORMAL LOW (ref 32.0–48.0)
pH, Arterial: 7.56 — ABNORMAL HIGH (ref 7.350–7.450)
pO2, Arterial: 193 mmHg — ABNORMAL HIGH (ref 83.0–108.0)

## 2016-03-30 LAB — URINE DRUG SCREEN, QUALITATIVE (ARMC ONLY)
Amphetamines, Ur Screen: NOT DETECTED
Barbiturates, Ur Screen: NOT DETECTED
Benzodiazepine, Ur Scrn: POSITIVE — AB
Cannabinoid 50 Ng, Ur ~~LOC~~: NOT DETECTED
Cocaine Metabolite,Ur ~~LOC~~: NOT DETECTED
MDMA (Ecstasy)Ur Screen: NOT DETECTED
Methadone Scn, Ur: NOT DETECTED
Opiate, Ur Screen: POSITIVE — AB
Phencyclidine (PCP) Ur S: NOT DETECTED
Tricyclic, Ur Screen: NOT DETECTED

## 2016-03-30 LAB — COMPREHENSIVE METABOLIC PANEL
ALT: 12 U/L — ABNORMAL LOW (ref 14–54)
AST: 21 U/L (ref 15–41)
Albumin: 3.6 g/dL (ref 3.5–5.0)
Alkaline Phosphatase: 75 U/L (ref 38–126)
Anion gap: 8 (ref 5–15)
BUN: 14 mg/dL (ref 6–20)
CO2: 27 mmol/L (ref 22–32)
Calcium: 8.6 mg/dL — ABNORMAL LOW (ref 8.9–10.3)
Chloride: 105 mmol/L (ref 101–111)
Creatinine, Ser: 0.54 mg/dL (ref 0.44–1.00)
GFR calc Af Amer: >60 mL/min (ref >60)
GFR calc non Af Amer: >60 mL/min (ref >60)
Glucose, Bld: 130 mg/dL — ABNORMAL HIGH (ref 65–99)
Potassium: 3.6 mmol/L (ref 3.5–5.1)
Sodium: 140 mmol/L (ref 135–145)
Total Bilirubin: 0.5 mg/dL (ref 0.3–1.2)
Total Protein: 6.5 g/dL (ref 6.5–8.1)

## 2016-03-30 LAB — ACETAMINOPHEN LEVEL
Acetaminophen (Tylenol), Serum: 103 ug/mL — ABNORMAL HIGH (ref 10–30)
Acetaminophen (Tylenol), Serum: 28 ug/mL (ref 10–30)

## 2016-03-30 LAB — GLUCOSE, CAPILLARY
Glucose-Capillary: 100 mg/dL — ABNORMAL HIGH (ref 65–99)
Glucose-Capillary: 101 mg/dL — ABNORMAL HIGH (ref 65–99)

## 2016-03-30 LAB — MRSA PCR SCREENING: MRSA by PCR: NEGATIVE

## 2016-03-30 LAB — MAGNESIUM
Magnesium: 1.8 mg/dL (ref 1.7–2.4)
Magnesium: 1.9 mg/dL (ref 1.7–2.4)

## 2016-03-30 LAB — TROPONIN I
Troponin I: <0.03 ng/mL (ref <0.03)
Troponin I: <0.03 ng/mL (ref <0.03)

## 2016-03-30 LAB — PHOSPHORUS
Phosphorus: 1.9 mg/dL — ABNORMAL LOW (ref 2.5–4.6)
Phosphorus: 2.1 mg/dL — ABNORMAL LOW (ref 2.5–4.6)

## 2016-03-30 LAB — TRIGLYCERIDES: Triglycerides: 120 mg/dL (ref <150)

## 2016-03-30 LAB — SALICYLATE LEVEL: Salicylate Lvl: <7 mg/dL (ref 2.8–30.0)

## 2016-03-30 LAB — ETHANOL: Alcohol, Ethyl (B): <5 mg/dL (ref <5)

## 2016-03-30 MED ORDER — PROPOFOL 1000 MG/100ML IV EMUL
5.0000 ug/kg/min | Freq: Once | INTRAVENOUS | Status: AC
  Administered 2016-03-30: 20 ug/kg/min via INTRAVENOUS

## 2016-03-30 MED ORDER — PROPOFOL 1000 MG/100ML IV EMUL
INTRAVENOUS | Status: AC
  Administered 2016-03-30: 90 ug/kg/min via INTRAVENOUS
  Filled 2016-03-30: qty 100

## 2016-03-30 MED ORDER — FENTANYL CITRATE (PF) 100 MCG/2ML IJ SOLN
100.0000 ug | Freq: Once | INTRAMUSCULAR | Status: AC
  Administered 2016-03-30: 100 ug via INTRAVENOUS
  Filled 2016-03-30: qty 2

## 2016-03-30 MED ORDER — SODIUM CHLORIDE 0.9 % IV SOLN: 250.0000 mL | INTRAVENOUS | Status: DC | PRN

## 2016-03-30 MED ORDER — SUCCINYLCHOLINE CHLORIDE 20 MG/ML IJ SOLN
120.0000 mg | Freq: Once | INTRAMUSCULAR | Status: AC
  Administered 2016-03-30: 120 mg via INTRAVENOUS

## 2016-03-30 MED ORDER — PROPOFOL 1000 MG/100ML IV EMUL
INTRAVENOUS | Status: AC
  Filled 2016-03-30: qty 100

## 2016-03-30 MED ORDER — ACETAMINOPHEN 325 MG PO TABS: 650.0000 mg | ORAL_TABLET | ORAL | Status: DC | PRN

## 2016-03-30 MED ORDER — ETOMIDATE 2 MG/ML IV SOLN
30.0000 mg | Freq: Once | INTRAVENOUS | Status: AC
  Administered 2016-03-30: 30 mg via INTRAVENOUS

## 2016-03-30 MED ORDER — ASPIRIN 300 MG RE SUPP: 300.0000 mg | RECTAL | Status: AC

## 2016-03-30 MED ORDER — ASPIRIN 81 MG PO CHEW: 324.0000 mg | CHEWABLE_TABLET | ORAL | Status: AC

## 2016-03-30 MED ORDER — ENOXAPARIN SODIUM 40 MG/0.4ML ~~LOC~~ SOLN
40.0000 mg | SUBCUTANEOUS | Status: DC
  Administered 2016-03-31 – 2016-04-03 (×4): 40 mg via SUBCUTANEOUS
  Filled 2016-03-30 (×6): qty 0.4

## 2016-03-30 MED ORDER — PRO-STAT SUGAR FREE PO LIQD: 30.0000 mL | Freq: Two times a day (BID) | ORAL | Status: DC

## 2016-03-30 MED ORDER — FAMOTIDINE IN NACL 20-0.9 MG/50ML-% IV SOLN
20.0000 mg | Freq: Two times a day (BID) | INTRAVENOUS | Status: DC
  Administered 2016-03-30 – 2016-04-03 (×9): 20 mg via INTRAVENOUS
  Filled 2016-03-30 (×10): qty 50

## 2016-03-30 MED ORDER — VITAL HIGH PROTEIN PO LIQD: 1000.0000 mL | ORAL | Status: DC

## 2016-03-30 MED ORDER — DEXTROSE 5 % IV SOLN
15.0000 mg/kg/h | INTRAVENOUS | Status: DC
  Administered 2016-03-30: 15 mg/kg/h via INTRAVENOUS
  Filled 2016-03-30 (×2): qty 200

## 2016-03-30 MED ORDER — PROPOFOL 1000 MG/100ML IV EMUL
5.0000 ug/kg/min | INTRAVENOUS | Status: DC
  Administered 2016-03-30: 80 ug/kg/min via INTRAVENOUS
  Administered 2016-03-30: 90 ug/kg/min via INTRAVENOUS
  Administered 2016-03-31: 80 ug/kg/min via INTRAVENOUS
  Administered 2016-03-31: 70 ug/kg/min via INTRAVENOUS
  Administered 2016-03-31: 80 ug/kg/min via INTRAVENOUS
  Filled 2016-03-30 (×5): qty 100

## 2016-03-30 MED ORDER — LORAZEPAM 2 MG/ML IJ SOLN
1.0000 mg | Freq: Four times a day (QID) | INTRAMUSCULAR | Status: DC | PRN
  Administered 2016-03-30: 2 mg via INTRAVENOUS
  Filled 2016-03-30: qty 1

## 2016-03-30 MED ORDER — ACETYLCYSTEINE LOAD VIA INFUSION
150.0000 mg/kg | Freq: Once | INTRAVENOUS | Status: AC
  Administered 2016-03-30: 10365 mg via INTRAVENOUS
  Filled 2016-03-30: qty 260

## 2016-03-30 MED ORDER — ONDANSETRON HCL 4 MG/2ML IJ SOLN
4.0000 mg | Freq: Four times a day (QID) | INTRAMUSCULAR | Status: DC | PRN
  Administered 2016-03-31 – 2016-04-03 (×3): 4 mg via INTRAVENOUS
  Filled 2016-03-30 (×3): qty 2

## 2016-03-30 MED ORDER — LACTATED RINGERS IV SOLN
INTRAVENOUS | Status: DC
  Administered 2016-03-30 – 2016-03-31 (×3): via INTRAVENOUS
  Administered 2016-04-01: 100 mL/h via INTRAVENOUS

## 2016-03-30 MED ORDER — CHLORHEXIDINE GLUCONATE 0.12% ORAL RINSE (MEDLINE KIT)
15.0000 mL | Freq: Two times a day (BID) | OROMUCOSAL | Status: DC
  Administered 2016-03-30: 15 mL via OROMUCOSAL

## 2016-03-30 MED ORDER — ORAL CARE MOUTH RINSE
15.0000 mL | Freq: Four times a day (QID) | OROMUCOSAL | Status: DC
  Administered 2016-03-31 (×3): 15 mL via OROMUCOSAL

## 2016-03-30 MED ORDER — MIDAZOLAM HCL 2 MG/2ML IJ SOLN
2.0000 mg | Freq: Once | INTRAMUSCULAR | Status: AC
  Administered 2016-03-30: 2 mg via INTRAVENOUS

## 2016-03-30 MED ORDER — MIDAZOLAM HCL 2 MG/2ML IJ SOLN
INTRAMUSCULAR | Status: AC
  Administered 2016-03-30: 2 mg via INTRAVENOUS
  Filled 2016-03-30: qty 2

## 2016-03-30 MED ORDER — IPRATROPIUM-ALBUTEROL 0.5-2.5 (3) MG/3ML IN SOLN: 3.0000 mL | Freq: Four times a day (QID) | RESPIRATORY_TRACT | Status: DC | PRN

## 2016-03-30 NOTE — ED Notes (Signed)
Per MD orders, pts propofol to be titratedto 124mcg/kg/min due to pt not being properly sedated

## 2016-03-30 NOTE — ED Provider Notes (Signed)
Time Seen: Approximately **1240  I have reviewed the triage notes  Chief Complaint: Drug Overdose   History of Present Illness: Shelly Freeman is a 65 y.o. female *who apparently ingested Benadryl with intense to harm herself. According to EMS providers the patient just recently had purchased a full bottle 125 mg tablets of Benadryl. The patient is bottle was empty beside her bed. No other obvious coingestions are noted though the patient is on multiple medications mostly for psychiatric purposes for depression etc. The patient had stated to her family that she loved them and went into the bedroom and wasn't seen for about 20 minutes and the family went in to attend to her and EMS was notified. She was transported here hemodynamically stable though started become more and more obtunded in transport. Nasopharyngeal tube was established along with bag-valve-mask ventilation. Patient was unable to offer any history or review of systems   Past Medical History:  Diagnosis Date  . Arthritis   . Depression   . Heart valve problem    per grandaughter need surgery but they told her may not survive;  [name] Hypertension   . Lupus   . Thyroid disease     There are no active problems to display for this patient.   Past Surgical History:  Procedure Laterality Date  . APPENDECTOMY    . BREAST LUMPECTOMY    . CESAREAN SECTION    . GASTRIC BYPASS    . TUBAL LIGATION      Past Surgical History:  Procedure Laterality Date  . APPENDECTOMY    . BREAST LUMPECTOMY    . CESAREAN SECTION    . GASTRIC BYPASS    . TUBAL LIGATION      Current Outpatient Rx  . Order #: [id number] Class: Historical Med  . Order #: [id number] Class: Print  . Order #: [id number] Class: Historical Med  . Order #: [id number] Class: Print  . Order #: [id number] Class: Historical Med  . Order #: [id number] Class: Historical Med  . Order #: [id number] Class: Historical Med  . Order #: [id number] Class: Print  . Order #:  [id number] Class: Print  . Order #: [id number] Class: Historical Med    Allergies:  Patient has no known allergies.  Family History: No family history on file.  Social History: Social History  Substance Use Topics  . Smoking status: Never Smoker  . Smokeless tobacco: Not on file  . Alcohol use No     Review of Systems:   10 point review of systems was performed and was otherwise negative: Review of system was provided by family and EMS. Patient's currently Glasgow Coma Scale of 8 Constitutional: No fever Eyes: No visual disturbances ENT: No sore throat, ear pain Cardiac: No chest pain Respiratory: No shortness of breath, wheezing, or stridor Abdomen: No abdominal pain, no vomiting, No diarrhea Endocrine: No weight loss, No night sweats Extremities: No peripheral edema, cyanosis Skin: No rashes, easy bruising Neurologic: No focal weakness, trouble with speech or swollowing Urologic: No dysuria, Hematuria, or urinary frequency *  Physical Exam:  ED Triage Vitals  Enc Vitals Group     BP 03/30/16 1241 (!) 129/109     Pulse Rate 03/30/16 1235 (!) 105     Resp 03/30/16 1235 (!) 28     Temp --      Temp src --      SpO2 03/30/16 1235 100 %     Weight 03/30/16 1228 150 lb (68 kg)     Height 03/30/16 1228 5\' 3"  (1.6 m)  Head Circumference --      Peak Flow --      Pain Score --      Pain Loc --      Pain Edu? --      Excl. in [place]? --     General: Obtunded; GCS 8 Head: Normal cephalic , atraumatic Eyes: Pupils equal , small, reactive to light Nose/Throat: No nasal drainage, patent upper airway without erythema or exudate.  Neck: Supple, Full range of motion, No anterior adenopathy or palpable thyroid masses Lungs: Clear to ascultation without wheezes , rhonchi, or rales Heart: Regular rate, regular rhythm without murmurs , gallops , or rubs Abdomen: Soft, non tender without rebound, guarding , or rigidity; bowel sounds positive and symmetric in all 4 quadrants. No  organomegaly .        Extremities: 2 plus symmetric pulses. No edema, clubbing or cyanosis Neurologic: normal ambulation, Motor symmetric without deficits, sensory intact Skin: warm, dry, no rashes   Labs:   All laboratory work was reviewed including any pertinent negatives or positives listed below:  Labs Reviewed  ACETAMINOPHEN LEVEL  CBC WITH DIFFERENTIAL/PLATELET  COMPREHENSIVE METABOLIC PANEL  ETHANOL  URINALYSIS COMPLETEWITH MICROSCOPIC ([place])  URINE DRUG SCREEN, QUALITATIVE ([place] ONLY)    EKG:  ED ECG REPORT I, Daymon Larsen, the attending physician, personally viewed and interpreted this ECG.  Date: 03/30/2016 EKG Time: 1237Rate:115 Rhythm: normal sinus rhythm QRS Axis: normal Intervals: normal ST/T Wave abnormalities: normal Conduction Disturbances: none Narrative Interpretation: unremarkable No acute ischemic changes   Radiology: *  "Dg Chest Portable 1 View  Result Date: 03/30/2016 CLINICAL DATA:  Hypoxia EXAM: PORTABLE CHEST 1 VIEW COMPARISON:  None. FINDINGS: Endotracheal tube tip is 2.9 cm above the carina. Nasogastric tube tip is in the proximal stomach with the side port at the gastroesophageal junction. No pneumothorax. No edema or consolidation. Heart size and pulmonary vascularity are normal. No adenopathy. There is degenerative change in each shoulder. There old healed rib fractures on the right. IMPRESSION: Tube positions as described without pneumothorax. No edema or consolidation. Electronically Signed   By: Lowella Grip III M.D.   On: 03/30/2016 13:05   Dg Abd Portable 1 View  Result Date: 03/30/2016 CLINICAL DATA:  Nasogastric tube placement EXAM: PORTABLE ABDOMEN - 1 VIEW COMPARISON:  None. FINDINGS: Nasogastric tube tip is in the proximal stomach with the side port at the gastroesophageal junction. Visualized bowel gas pattern is normal. No bowel obstruction or free air evident. Lung bases clear. IMPRESSION: Nasogastric tube tip in  proximal stomach. The side port for the nasogastric tube is at the gastroesophageal junction. Advise advancing nasogastric tube 5-6 cm to insure placement of tube tip and side port in stomach. Bowel gas pattern unremarkable. Electronically Signed   By: Lowella Grip III M.D.   On: 03/30/2016 13:03  "  I personally reviewed the radiologic studies   Procedures:  Rapid sequence intubation for airway control and aspiration prevention The patient's arrives somewhat obtunded with the ability distress tolerate a nasopharyngeal tube. It was determined that she required orotracheal intubation to prevent aspiration and stabilized airway management. Patient received etomidate and succinylcholine. Patient was intubated on second pass with a small Macintosh blade with good visualization of the cords and passage of 7.5 mm endotracheal tube   Critical Care:  CRITICAL CARE Performed by: Daymon Larsen   Total critical care time: 35 minutes  Critical care time was exclusive of separately billable procedures and treating other patients.  Critical care was necessary to treat or prevent imminent or life-threatening deterioration.  Critical care was time spent personally by me on the following activities: development of treatment plan with patient and/or surrogate as well as nursing, discussions with consultants, evaluation of patient's response to treatment, examination of patient, obtaining history from patient or surrogate, ordering and performing treatments and interventions, ordering and review of laboratory studies, ordering and review of radiographic studies, pulse oximetry and re-evaluation of patient's condition. Initial evaluation and treatment for acute overdose    ED Course:  After the airway was stabilized patient was started on a propofol bolus at 20 mics per kilogram and then is been titrated on a propofol drip up to 100 mics per kilogram per minute. The patient is hemodynamically stable  and fentanyl 100 g was added to sedation for pain control. Patient had a nasogastric tube and Foley catheter established by the nursing staff. The patient Clinical Course      Assessment: * Diphenhydramine overdose Suicidal ideation      Plan:  Admission to the intensive care unit            Daymon Larsen, MD 03/30/16 1351

## 2016-03-30 NOTE — Progress Notes (Signed)
Pt. Was transported to CCU from ED while on the vent. 

## 2016-03-30 NOTE — Progress Notes (Signed)
Initial Nutrition Assessment  DOCUMENTATION CODES:   Not applicable  INTERVENTION:  -Adult Tube Feeding Protocol with PEPuP initiated. Will assess new goal rate once Ve, temp available and able to accurately assess needs. Current TF provides 1160 kcals, 115 g of protein. Meets 100% estimated protein needs based on current wt  NUTRITION DIAGNOSIS:   Inadequate oral intake related to acute illness as evidenced by NPO status.  GOAL:   Provide needs based on ASPEN/SCCM guidelines  MONITOR:   TF tolerance, Vent status, Labs, Weight trends  REASON FOR ASSESSMENT:   Consult Enteral/tube feeding initiation and management  ASSESSMENT:    65 yo female admitted with injestion of benadryl with intent to harm self. Pt required intubation on admission.   Patient is currently intubated on ventilator support No temperature or Ve available at time of assessment  Labs: reviewed Meds: reviewed  Past Medical History:  Diagnosis Date  . Arthritis   . Depression   . Heart valve problem    per grandaughter need surgery but they told her may not survive;  [name] Hypertension   . Lupus   . Thyroid disease     Diet Order:   NPO  Skin:  Reviewed, no issues  Last BM:  no documented BM  Height:   Ht Readings from Last 1 Encounters:  03/30/16 5\' 3"  (1.6 m)    Weight:   Wt Readings from Last 1 Encounters:  03/30/16 150 lb (68 kg)    BMI:  Body mass index is 26.57 kg/m.  Estimated Nutritional Needs:   Kcal:  reassess on follow-upre  Protein:  82-136  Fluid:  >/= 2 L  EDUCATION NEEDS:   No education needs identified at this time  [place], [place], [place] [phone] Pager  [phone] Weekend/On-Call Pager

## 2016-03-30 NOTE — Progress Notes (Signed)
Pharmacy Note - Medication History  Patient intubated and sedated in ED, no family members present. Bag of prescription and OTC medications accompanying patient.   Medication history updated based on medications brought in with patient and pharmacy records. Medication history may warrant additional review when patient is alert or if family arrives.  Rexene Edison, PharmD Clinical Pharmacist  03/30/2016 3:26 PM

## 2016-03-30 NOTE — ED Notes (Addendum)
Sitter at bedside with pt to ensure pt does not pull out any tubes. Admitting provider aware of pts status

## 2016-03-30 NOTE — ED Notes (Signed)
Pt combative and fighting the ventilator. EDP made aware and Propofol changed to 60 mcg

## 2016-03-30 NOTE — ED Triage Notes (Signed)
Pt presents with possible overdose, found with empty bottle of 25mg  benadryl. Possible ingestion of trazodone and ativan. EMS placed nasal trumpet and assisted respirations with bag-valve mask

## 2016-03-30 NOTE — ED Notes (Signed)
Indwelling foley catheter inserted.

## 2016-03-30 NOTE — Progress Notes (Signed)
MEDICATION RELATED CONSULT NOTE - INITIAL   Pharmacy Consult for NAC  Indication: APAP OD  No Known Allergies  Patient Measurements: Height: 5\' 5"  (165.1 cm) Weight: 152 lb 5.4 oz (69.1 kg) IBW/kg (Calculated) : 57  Vital Signs: Temp: 97.3 F (36.3 C) (11/24 1500) Temp Source: Axillary (11/24 1500) BP: 132/68 (11/24 1500) Pulse Rate: 64 (11/24 1500) Intake/Output from previous day: No intake/output data recorded. Intake/Output from this shift: No intake/output data recorded.  Labs:  Recent Labs  03/30/16 1232 03/30/16 1520  WBC 7.2  --   HGB 12.6  --   HCT 38.3  --   PLT 206  --   CREATININE 0.54  --   MG  --  1.8  PHOS  --  1.9*  ALBUMIN 3.6  --   PROT 6.5  --   AST 21  --   ALT 12*  --   ALKPHOS 75  --   BILITOT 0.5  --    Estimated Creatinine Clearance: 70.2 mL/min (by C-G formula based on SCr of 0.54 mg/dL).   Microbiology: No results found for this or any previous visit (from the past 720 hour(s)).  Medical History: Past Medical History:  Diagnosis Date  . Arthritis   . Depression   . Heart valve problem    per grandaughter need surgery but they told her may not survive;  [name] Hypertension   . Lupus   . Thyroid disease     Medications:  Prescriptions Prior to Admission  Medication Sig Dispense Refill Last Dose  . acetaminophen (TYLENOL) 500 MG tablet Take 325 mg by mouth every 6 (six) hours as needed.     [name] alendronate (FOSAMAX) 70 MG tablet Take 70 mg by mouth once a week. Take with a full glass of water on an empty stomach.     [name] amLODipine (NORVASC) 5 MG tablet Take 5 mg by mouth daily.     [name] b complex vitamins tablet Take 1 tablet by mouth daily.     . DULoxetine (CYMBALTA) 30 MG capsule Take 30 mg by mouth daily.     . folic acid (FOLVITE) 1 MG tablet Take 1 mg by mouth daily.     [name] gabapentin (NEURONTIN) 800 MG tablet Take 800 mg by mouth 2 (two) times daily.     [name] HYDROcodone-acetaminophen (NORCO/VICODIN) 5-325 MG tablet Take 1 tablet by  mouth 2 (two) times daily as needed for moderate pain.     . hydroxychloroquine (PLAQUENIL) 200 MG tablet Take 1 tablet (200 mg total) by mouth 2 (two) times daily. (Patient taking differently: Take 400 mg by mouth daily. ) 30 tablet 0   . levothyroxine (SYNTHROID, LEVOTHROID) 75 MCG tablet Take 75 mcg by mouth daily before breakfast.     . LORazepam (ATIVAN) 2 MG tablet Take 2 mg by mouth 2 (two) times daily as needed for anxiety.     . ondansetron (ZOFRAN) 4 MG tablet Take 1 tablet (4 mg total) by mouth every 8 (eight) hours as needed for nausea or vomiting. 10 tablet 0   . traZODone (DESYREL) 50 MG tablet Take 1 tablet (50 mg total) by mouth at bedtime. (Patient taking differently: Take 150 mg by mouth at bedtime. ) 14 tablet 0   . buPROPion (WELLBUTRIN XL) 150 MG 24 hr tablet Take 1 tablet (150 mg total) by mouth daily. (Patient not taking: Reported on 03/30/2016) 14 tablet 0 Not Taking at Unknown time  . ibuprofen (ADVIL,MOTRIN) 800 MG tablet Take  800 mg by mouth every 8 (eight) hours as needed (pain).     . Vitamin D, Ergocalciferol, (DRISDOL) 50000 units CAPS capsule Take 50,000 Units by mouth every 7 (seven) days.       Assessment: 65 y/o F with reported diphenhydramine overdose and elevated APAP level. Due to h/o buproprion PTA, will need EKG monitoring q 6 hours.   Plan:  NAC  150 mg/kg load and 15 mg/kg/hr infusion. Will f/u labs and report to poison control as needed.   Ulice Dash D 03/30/2016,4:35 PM

## 2016-03-30 NOTE — H&P (Signed)
PULMONARY / CRITICAL CARE MEDICINE   Name: Shelly Freeman MRN: [id number] DOB: [birth year]    ADMISSION DATE:  03/30/2016 CONSULTATION DATE:  03/30/2016  REFERRING MD:  Dr. Marcelene Butte  CHIEF COMPLAINT:  Drug Overdose  HISTORY OF PRESENT ILLNESS:   This is a 65 yo female with PMH of Thyroid Disease, Lupus, HTN, Heart Valve Problem, Depression, and Arthritis.  She presented to [facility] ER on 11/24 following an attempt to harm herself per ER physician it is estimated she ingested 100 capsules 25 mg strength Benadryl and questionable Tylenol ingestion uncertain if she ingested other medications.  She is on multiple medications for psychiatric purposes primarily depression.  Per ER notes the pt had stated to her family that she loved them and went into her bedroom after 20 minutes of the pt being in her bedroom her family checked on her she was found unresponsive, therefore EMS was notified.  During EMS transport she was hemodynamically stable with worsening mental status she subsequently became obtunded requiring the placement of a nasopharyngeal tube with bag valve mask ventilation.  She was subsequently intubated in the ER for airway protection.  PCCM contacted to admit the pt to ICU and Poison Control was contacted.    PAST MEDICAL HISTORY :  She  has a past medical history of Arthritis; Depression; Heart valve problem; Hypertension; Lupus; and Thyroid disease.  PAST SURGICAL HISTORY: She  has a past surgical history that includes Gastric bypass; Appendectomy; Cesarean section; Tubal ligation; and Breast lumpectomy.  No Known Allergies  No current facility-administered medications on file prior to encounter.    Current Outpatient Prescriptions on File Prior to Encounter  Medication Sig  . amLODipine (NORVASC) 5 MG tablet Take 5 mg by mouth daily.  [name] gabapentin (NEURONTIN) 800 MG tablet Take 800 mg by mouth 2 (two) times daily.  . hydroxychloroquine (PLAQUENIL) 200 MG tablet Take 1 tablet (200  mg total) by mouth 2 (two) times daily. (Patient taking differently: Take 400 mg by mouth daily. )  . levothyroxine (SYNTHROID, LEVOTHROID) 75 MCG tablet Take 75 mcg by mouth daily before breakfast.  . LORazepam (ATIVAN) 2 MG tablet Take 2 mg by mouth 2 (two) times daily as needed for anxiety.  . ondansetron (ZOFRAN) 4 MG tablet Take 1 tablet (4 mg total) by mouth every 8 (eight) hours as needed for nausea or vomiting.  . traZODone (DESYREL) 50 MG tablet Take 1 tablet (50 mg total) by mouth at bedtime. (Patient taking differently: Take 150 mg by mouth at bedtime. )  . buPROPion (WELLBUTRIN XL) 150 MG 24 hr tablet Take 1 tablet (150 mg total) by mouth daily. (Patient not taking: Reported on 03/30/2016)  . ibuprofen (ADVIL,MOTRIN) 800 MG tablet Take 800 mg by mouth every 8 (eight) hours as needed (pain).  . Vitamin D, Ergocalciferol, (DRISDOL) 50000 units CAPS capsule Take 50,000 Units by mouth every 7 (seven) days.    FAMILY HISTORY:  Her has no family status information on file.    SOCIAL HISTORY: She  reports that she has never smoked. She does not have any smokeless tobacco history on file. She reports that she does not drink alcohol or use drugs.  REVIEW OF SYSTEMS:   Unable to assess pt intubated  SUBJECTIVE:  Unable to assess pt currently intubated  VITAL SIGNS: BP 132/68 (BP Location: Right Arm)   Pulse 64   Temp 97.3 F (36.3 C) (Axillary)   Resp 14   Ht 5\' 5"  (1.651 m)   Wt 152  lb 5.4 oz (69.1 kg)   SpO2 100%   BMI 25.35 kg/m   HEMODYNAMICS:    VENTILATOR SETTINGS: Vent Mode: PRVC FiO2 (%):  [40 %-100 %] 40 % Set Rate:  [18 bmp] 18 bmp Vt Set:  [420 mL-450 mL] 420 mL PEEP:  [5 cmH20] 5 cmH20 Plateau Pressure:  [12 cmH20] 12 cmH20  INTAKE / OUTPUT: No intake/output data recorded.  PHYSICAL EXAMINATION: General:  Acutely ill appearing Caucasian female Neuro:  Sedated withdraws from painful stimulation, PERRL HEENT:  Supple, no JVD Cardiovascular:  NSR, s1s2,  rrr, no M/R/G Lungs:  Coarse throughout, even, non labored, intubated Abdomen:  Hypoactive BS x4, soft, non tender, non distended Musculoskeletal:  Normal bulk or tone Skin:  Intact no rashes or lesions  LABS:  BMET  Recent Labs Lab 03/30/16 1232  NA 140  K 3.6  CL 105  CO2 27  BUN 14  CREATININE 0.54  GLUCOSE 130*    Electrolytes  Recent Labs Lab 03/30/16 1232  CALCIUM 8.6*    CBC  Recent Labs Lab 03/30/16 1232  WBC 7.2  HGB 12.6  HCT 38.3  PLT 206    Coag's No results for input(s): APTT, INR in the last 168 hours.  Sepsis Markers No results for input(s): LATICACIDVEN, PROCALCITON, O2SATVEN in the last 168 hours.  ABG No results for input(s): PHART, PCO2ART, PO2ART in the last 168 hours.  Liver Enzymes  Recent Labs Lab 03/30/16 1232  AST 21  ALT 12*  ALKPHOS 75  BILITOT 0.5  ALBUMIN 3.6    Cardiac Enzymes No results for input(s): TROPONINI, PROBNP in the last 168 hours.  Glucose  Recent Labs Lab 03/30/16 1444  GLUCAP 100*    Imaging Dg Chest Portable 1 View  Result Date: 03/30/2016 CLINICAL DATA:  Hypoxia EXAM: PORTABLE CHEST 1 VIEW COMPARISON:  None. FINDINGS: Endotracheal tube tip is 2.9 cm above the carina. Nasogastric tube tip is in the proximal stomach with the side port at the gastroesophageal junction. No pneumothorax. No edema or consolidation. Heart size and pulmonary vascularity are normal. No adenopathy. There is degenerative change in each shoulder. There old healed rib fractures on the right. IMPRESSION: Tube positions as described without pneumothorax. No edema or consolidation. Electronically Signed   By: Lowella Grip III M.D.   On: 03/30/2016 13:05   Dg Abd Portable 1 View  Result Date: 03/30/2016 CLINICAL DATA:  Nasogastric tube placement EXAM: PORTABLE ABDOMEN - 1 VIEW COMPARISON:  None. FINDINGS: Nasogastric tube tip is in the proximal stomach with the side port at the gastroesophageal junction. Visualized  bowel gas pattern is normal. No bowel obstruction or free air evident. Lung bases clear. IMPRESSION: Nasogastric tube tip in proximal stomach. The side port for the nasogastric tube is at the gastroesophageal junction. Advise advancing nasogastric tube 5-6 cm to insure placement of tube tip and side port in stomach. Bowel gas pattern unremarkable. Electronically Signed   By: Lowella Grip III M.D.   On: 03/30/2016 13:03   STUDIES:  None  CULTURES: None  ANTIBIOTICS: None  SIGNIFICANT EVENTS: 11/24-Pt admitted to [facility] ICU due to intentional overdose following ingestion of Benadryl and questionableTylenol ingestion requiring mechanical intubation for airway protection  LINES/TUBES: PIV's x2 11/24  ASSESSMENT / PLAN:  PULMONARY A: Mechanical ventilation for airway protection following intentional drug overdose P:   Full vent support wean as tolerated  VAP bundle Maintain O2 sats >92% Prn bronchodilators Intermittent CXR and ABG's  CARDIOVASCULAR A:  No acute issues  Hx: HTN and Heart valve problems P:  Trend troponin's Continuous telemetry monitoring EKG q6hrs  RENAL A:   No acute issues P:   Trend BMP's Replace electrolytes as indicated Monitor UOP Lactated Ringer's @100  ml/hr  GASTROINTESTINAL A:   No acute issues P:   Pepcid for PUD prophylaxis Keep NPO for now  HEMATOLOGIC A:   Elevated Acetaminophen level secondary to questionable Tylenol ingestion P:  Will start acetylcysteine  Trend liver enzyme levels daily Lovenox for VTE prophylaxis Trend CBC's Transfuse for hgb <7  INFECTIOUS A:  No acute issues P:   Trend WBC's and monitor fever curve Trend lactic acid's Will obtain pan cultures if pt becomes febrile  ENDOCRINE A:   Hypothyroidism  P:   Continue outpatient synthroid  Monitor serum glucose Hyper/Hypglycemic Protocol  NEUROLOGIC A:   Acute encephalopathy secondary to intentional drug overdose Hx: Depression, Lupus, and  Arthritis  P:   RASS goal: 0 to -1 Continue Propofol gtt to maintain RASS goal  Prn fentanyl and versed   FAMILY  - Updates: No family at bedside to update at this time 03/30/2016  - Inter-disciplinary family meet or Palliative Care meeting due by:  02/05/2016  Marda Stalker, [place] Pager [phone] (please enter 7 digits) PCCM Consult Pager [phone] (please enter 7 digits)  Pt seen and examined with NP, agree with assessment and plan as emended above. S/p acute resp failure secondary to reduced mental status from intentional od of benadryl and possibly other substances; current lungs CTA. Continue vent support.  Acetaminophen level elevated, will start mucomyst.   Marda Stalker, M.D.  03/30/2016   Critical Care Attestation.  I have personally obtained a history, examined the patient, evaluated laboratory and imaging results, formulated the assessment and plan and placed orders. The Patient requires high complexity decision making for assessment and support, frequent evaluation and titration of therapies, application of advanced monitoring technologies and extensive interpretation of multiple databases. The patient has critical illness that could lead imminently to failure of 1 or more organ systems and requires the highest level of physician preparedness to intervene.  Critical Care Time devoted to patient care services described in this note is 35 minutes and is exclusive of time spent in procedures.

## 2016-03-31 ENCOUNTER — Inpatient Hospital Stay: Payer: Medicaid Other

## 2016-03-31 ENCOUNTER — Ambulatory Visit (HOSPITAL_COMMUNITY)
Admit: 2016-03-31 | Discharge: 2016-03-31 | Disposition: A | Discharge status: Home / Self Care | Payer: Medicaid Other | Attending: Family Medicine | Admitting: Family Medicine

## 2016-03-31 DIAGNOSIS — T450X2A Poisoning by antiallergic and antiemetic drugs, intentional self-harm, initial encounter: Secondary | ICD-10-CM | POA: Diagnosis not present

## 2016-03-31 DIAGNOSIS — J9601 Acute respiratory failure with hypoxia: Secondary | ICD-10-CM | POA: Diagnosis not present

## 2016-03-31 LAB — GLUCOSE, CAPILLARY
Glucose-Capillary: 74 mg/dL (ref 65–99)
Glucose-Capillary: 79 mg/dL (ref 65–99)
Glucose-Capillary: 85 mg/dL (ref 65–99)
Glucose-Capillary: 87 mg/dL (ref 65–99)
Glucose-Capillary: 89 mg/dL (ref 65–99)
Glucose-Capillary: 91 mg/dL (ref 65–99)
Glucose-Capillary: 91 mg/dL (ref 65–99)

## 2016-03-31 LAB — BLOOD GAS, ARTERIAL
Acid-base deficit: 1.1 mmol/L (ref 0.0–2.0)
Bicarbonate: 22.4 mmol/L (ref 20.0–28.0)
FIO2: 0.28
MECHVT: 420 mL
O2 Saturation: 98.5 %
PEEP: 5 cmH2O
Patient temperature: 37
RATE: 18 resp/min
pCO2 arterial: 33 mmHg (ref 32.0–48.0)
pH, Arterial: 7.44 (ref 7.350–7.450)
pO2, Arterial: 110 mmHg — ABNORMAL HIGH (ref 83.0–108.0)

## 2016-03-31 LAB — HEPATIC FUNCTION PANEL
ALT: 12 U/L — ABNORMAL LOW (ref 14–54)
AST: 22 U/L (ref 15–41)
Albumin: 3.2 g/dL — ABNORMAL LOW (ref 3.5–5.0)
Alkaline Phosphatase: 66 U/L (ref 38–126)
Bilirubin, Direct: <0.1 mg/dL — ABNORMAL LOW (ref 0.1–0.5)
Total Bilirubin: 0.9 mg/dL (ref 0.3–1.2)
Total Protein: 6 g/dL — ABNORMAL LOW (ref 6.5–8.1)

## 2016-03-31 LAB — MAGNESIUM
Magnesium: 1.8 mg/dL (ref 1.7–2.4)
Magnesium: 1.8 mg/dL (ref 1.7–2.4)

## 2016-03-31 LAB — BASIC METABOLIC PANEL
Anion gap: 10 (ref 5–15)
BUN: 11 mg/dL (ref 6–20)
CO2: 23 mmol/L (ref 22–32)
Calcium: 8.6 mg/dL — ABNORMAL LOW (ref 8.9–10.3)
Chloride: 107 mmol/L (ref 101–111)
Creatinine, Ser: 0.48 mg/dL (ref 0.44–1.00)
GFR calc Af Amer: >60 mL/min (ref >60)
GFR calc non Af Amer: >60 mL/min (ref >60)
Glucose, Bld: 92 mg/dL (ref 65–99)
Potassium: 3.3 mmol/L — ABNORMAL LOW (ref 3.5–5.1)
Sodium: 140 mmol/L (ref 135–145)

## 2016-03-31 LAB — PHOSPHORUS
Phosphorus: 2.7 mg/dL (ref 2.5–4.6)
Phosphorus: 2.7 mg/dL (ref 2.5–4.6)

## 2016-03-31 LAB — TROPONIN I: Troponin I: <0.03 ng/mL (ref <0.03)

## 2016-03-31 LAB — CBC
HCT: 37.8 % (ref 35.0–47.0)
Hemoglobin: 12.9 g/dL (ref 12.0–16.0)
MCH: 30.1 pg (ref 26.0–34.0)
MCHC: 34.2 g/dL (ref 32.0–36.0)
MCV: 88.1 fL (ref 80.0–100.0)
Platelets: 157 10*3/uL (ref 150–440)
RBC: 4.29 MIL/uL (ref 3.80–5.20)
RDW: 15.8 % — ABNORMAL HIGH (ref 11.5–14.5)
WBC: 10.9 10*3/uL (ref 3.6–11.0)

## 2016-03-31 LAB — ACETAMINOPHEN LEVEL: Acetaminophen (Tylenol), Serum: <10 ug/mL — ABNORMAL LOW (ref 10–30)

## 2016-03-31 MED ORDER — PROPOFOL 10 MG/ML IV BOLUS
25.0000 mg | Freq: Once | INTRAVENOUS | Status: AC
  Administered 2016-03-31: 25 mg via INTRAVENOUS

## 2016-03-31 MED ORDER — MIDAZOLAM HCL 2 MG/2ML IJ SOLN
1.0000 mg | INTRAMUSCULAR | Status: DC | PRN
  Administered 2016-03-31: 2 mg via INTRAVENOUS
  Filled 2016-03-31: qty 4

## 2016-03-31 MED ORDER — POTASSIUM CHLORIDE 20 MEQ PO PACK
40.0000 meq | PACK | Freq: Once | ORAL | Status: AC
  Administered 2016-03-31: 40 meq
  Filled 2016-03-31: qty 2

## 2016-03-31 MED ORDER — MAGNESIUM HYDROXIDE 400 MG/5ML PO SUSP
30.0000 mL | Freq: Every day | ORAL | Status: DC | PRN
  Administered 2016-04-01 – 2016-04-02 (×2): 30 mL via ORAL
  Filled 2016-03-31 (×2): qty 30

## 2016-03-31 MED ORDER — NAPROXEN 375 MG PO TABS
375.0000 mg | ORAL_TABLET | ORAL | Status: DC | PRN
  Administered 2016-03-31 – 2016-04-01 (×4): 375 mg via ORAL
  Filled 2016-03-31: qty 1
  Filled 2016-03-31: qty 2
  Filled 2016-03-31 (×4): qty 1

## 2016-03-31 NOTE — Progress Notes (Addendum)
MEDICATION RELATED CONSULT NOTE - INITIAL   Pharmacy Consult for NAC  Indication: APAP OD  No Known Allergies  Patient Measurements: Height: 5\' 5"  (165.1 cm) Weight: 152 lb 12.5 oz (69.3 kg) IBW/kg (Calculated) : 57  Vital Signs: Temp: 98.6 F (37 C) (11/25 0400) Temp Source: Axillary (11/25 0400) BP: 137/51 (11/25 0400) Pulse Rate: 78 (11/25 0400) Intake/Output from previous day: 11/24 0701 - 11/25 0700 In: 1994.4 [I.V.:1904.4; IV Piggyback:50] Out: 1967 [id number] Intake/Output from this shift: Total I/O In: 1610.4 [I.V.:1570.4; Other:40] Out: 1417 [id number]  Labs:  Recent Labs  03/30/16 1232 03/30/16 1520 03/30/16 1649 03/31/16 0154  WBC 7.2  --   --  10.9  HGB 12.6  --   --  12.9  HCT 38.3  --   --  37.8  PLT 206  --   --  157  CREATININE 0.54  --   --  0.48  MG  --  1.8 1.9 1.8  PHOS  --  1.9* 2.1* 2.7  ALBUMIN 3.6  --   --   --   PROT 6.5  --   --   --   AST 21  --   --   --   ALT 12*  --   --   --   ALKPHOS 75  --   --   --   BILITOT 0.5  --   --   --    Estimated Creatinine Clearance: 70.3 mL/min (by C-G formula based on SCr of 0.48 mg/dL).   Microbiology: Recent Results (from the past 720 hour(s))  MRSA PCR Screening     Status: None   Collection Time: 03/30/16  3:07 PM  Result Value Ref Range Status   MRSA by PCR NEGATIVE NEGATIVE Final    Comment:        The GeneXpert MRSA Assay (FDA approved for NASAL specimens only), is one component of a comprehensive MRSA colonization surveillance program. It is not intended to diagnose MRSA infection nor to guide or monitor treatment for MRSA infections.     Medical History: Past Medical History:  Diagnosis Date  . Arthritis   . Depression   . Heart valve problem    per grandaughter need surgery but they told her may not survive;  [name] Hypertension   . Lupus   . Thyroid disease     Medications:  Prescriptions Prior to Admission  Medication Sig Dispense Refill Last Dose  .  acetaminophen (TYLENOL) 500 MG tablet Take 325 mg by mouth every 6 (six) hours as needed.     [name] alendronate (FOSAMAX) 70 MG tablet Take 70 mg by mouth once a week. Take with a full glass of water on an empty stomach.     [name] amLODipine (NORVASC) 5 MG tablet Take 5 mg by mouth daily.     [name] b complex vitamins tablet Take 1 tablet by mouth daily.     . DULoxetine (CYMBALTA) 30 MG capsule Take 30 mg by mouth daily.     . folic acid (FOLVITE) 1 MG tablet Take 1 mg by mouth daily.     [name] gabapentin (NEURONTIN) 800 MG tablet Take 800 mg by mouth 2 (two) times daily.     [name] HYDROcodone-acetaminophen (NORCO/VICODIN) 5-325 MG tablet Take 1 tablet by mouth 2 (two) times daily as needed for moderate pain.     . hydroxychloroquine (PLAQUENIL) 200 MG tablet Take 1 tablet (200 mg total) by mouth 2 (two) times daily. (Patient taking differently: Take  400 mg by mouth daily. ) 30 tablet 0   . levothyroxine (SYNTHROID, LEVOTHROID) 75 MCG tablet Take 75 mcg by mouth daily before breakfast.     . LORazepam (ATIVAN) 2 MG tablet Take 2 mg by mouth 2 (two) times daily as needed for anxiety.     . ondansetron (ZOFRAN) 4 MG tablet Take 1 tablet (4 mg total) by mouth every 8 (eight) hours as needed for nausea or vomiting. 10 tablet 0   . traZODone (DESYREL) 50 MG tablet Take 1 tablet (50 mg total) by mouth at bedtime. (Patient taking differently: Take 150 mg by mouth at bedtime. ) 14 tablet 0   . buPROPion (WELLBUTRIN XL) 150 MG 24 hr tablet Take 1 tablet (150 mg total) by mouth daily. (Patient not taking: Reported on 03/30/2016) 14 tablet 0 Not Taking at Unknown time  . ibuprofen (ADVIL,MOTRIN) 800 MG tablet Take 800 mg by mouth every 8 (eight) hours as needed (pain).     . Vitamin D, Ergocalciferol, (DRISDOL) 50000 units CAPS capsule Take 50,000 Units by mouth every 7 (seven) days.       Assessment: 65 y/o F with reported diphenhydramine overdose and elevated APAP level. Due to h/o buproprion PTA, will need EKG monitoring q 6  hours.   Plan:  NAC  150 mg/kg load and 15 mg/kg/hr infusion. Will f/u labs and report to poison control as needed.   11/25 0154 APAP < 10. Spoke with poison control who recommended adding AST/ALT to determine if NAC can be stopped. Ordered hepatic panel add-on, will f/u.  11/25 0609 AST/ALT WNL. Poison control recommended stopping NAC. Spoke to Dr. Jimmy Footman at [facility] - she is in agreement.  Laural Benes, Pharm.D., BCPS Clinical Pharmacist 03/31/2016,4:59 AM

## 2016-03-31 NOTE — Progress Notes (Signed)
Pt actively reaching for tube, mouthing words and moving about in bed upon WUA. Pt reaching good volume during SBT, morning ABG clear. Pt extubated around 0815 to [facility]. RR even and unlabored. Lung sounds clear to auscultation.  Safety sitter at bedside. Will continue to monitor.

## 2016-03-31 NOTE — Progress Notes (Signed)
Granddaughter [name]) called and update on pt's condition and room transfer.

## 2016-03-31 NOTE — Progress Notes (Signed)
Granddaughter [name]), called and updated on grandmothers condition. Per granddaughter-Pt speaks primarily Spanish.  Per granddaughter- Pt has had frequent admissions in and out of the psychiatric hospital (here and in [facility]) for psychiatric issues. Per granddaughter-The night before admission- the pt left the house in the middle of the night with ppl she didn't even know, and that thankfully they brought her back home.   Grandmother lives with granddaughter, husband, and baby. The granddaughter is concerned of her grandmothers well-being and expresses concern for their living conditions.

## 2016-03-31 NOTE — H&P (Addendum)
PULMONARY / CRITICAL CARE MEDICINE     Name: Shelly Freeman MRN: [id number] DOB: July 26, 1950    ADMISSION DATE:  03/30/2016 CONSULTATION DATE:  03/30/2016  REFERRING MD:  Dr. Marcelene Butte  CHIEF COMPLAINT:  Drug Overdose  Subjective: Pt much more awake today, appears to want to be come off the vent.   REVIEW OF SYSTEMS:   Unable to assess pt intubated   VITAL SIGNS: BP (!) 143/63 (BP Location: Right Arm)   Pulse 84   Temp 99.8 F (37.7 C) (Axillary)   Resp 14   Ht 5\' 5"  (1.651 m)   Wt 69.3 kg (152 lb 12.5 oz)   SpO2 98%   BMI 25.42 kg/m   HEMODYNAMICS:    VENTILATOR SETTINGS: Vent Mode: PRVC FiO2 (%):  [28 %-40 %] 28 % Set Rate:  [18 bmp] 18 bmp Vt Set:  [420 mL] 420 mL PEEP:  [5 cmH20] 5 cmH20 Plateau Pressure:  [12 cmH20] 12 cmH20  INTAKE / OUTPUT: I/O last 3 completed shifts: In: 2418.1 [I.V.:2328.1; Other:40; IV Piggyback:50] Out: 2192 [Urine:2192]  PHYSICAL EXAMINATION: General:  Acutely ill appearing Caucasian female Neuro:  Sedated withdraws from painful stimulation, PERRL HEENT:  Supple, no JVD Cardiovascular:  NSR, s1s2, rrr, no M/R/G Lungs:  Coarse throughout, even, non labored, intubated Abdomen:  Hypoactive BS x4, soft, non tender, non distended Musculoskeletal:  Normal bulk or tone Skin:  Intact no rashes or lesions  LABS:  BMET  Recent Labs Lab 03/30/16 1232 03/31/16 0154  NA 140 140  K 3.6 3.3*  CL 105 107  CO2 27 23  BUN 14 11  CREATININE 0.54 0.48  GLUCOSE 130* 92    Electrolytes  Recent Labs Lab 03/30/16 1232 03/30/16 1520 03/30/16 1649 03/31/16 0154  CALCIUM 8.6*  --   --  8.6*  MG  --  1.8 1.9 1.8  PHOS  --  1.9* 2.1* 2.7    CBC  Recent Labs Lab 03/30/16 1232 03/31/16 0154  WBC 7.2 10.9  HGB 12.6 12.9  HCT 38.3 37.8  PLT 206 157    Coag's  Recent Labs Lab 03/30/16 1649  INR 0.97    Sepsis Markers No results for input(s): LATICACIDVEN, PROCALCITON, O2SATVEN in the last 168  hours.  ABG  Recent Labs Lab 03/30/16 1630 03/31/16 0500  PHART 7.56* 7.44  PCO2ART 28* 33  PO2ART 193* 110*    Liver Enzymes  Recent Labs Lab 03/30/16 1232 03/31/16 0154  AST 21 22  ALT 12* 12*  ALKPHOS 75 66  BILITOT 0.5 0.9  ALBUMIN 3.6 3.2*    Cardiac Enzymes  Recent Labs Lab 03/30/16 1512 03/30/16 1936 03/31/16 0154  TROPONINI <0.03 <0.03 <0.03    Glucose  Recent Labs Lab 03/30/16 1444 03/30/16 1940 03/31/16 0006 03/31/16 0336 03/31/16 0735 03/31/16 1152  GLUCAP 100* 101* 87 91 91 89    Imaging Dg Chest Port 1 View  Result Date: 03/31/2016 CLINICAL DATA:  Intubation. EXAM: PORTABLE CHEST 1 VIEW COMPARISON:  03/30/2016 FINDINGS: Endotracheal tube present with tip measuring 3.5 cm above the carina. Enteric tube tip is off the field of view but below the left hemidiaphragm. Shallow inspiration. Heart size and pulmonary vascularity are normal. Lungs are clear. No blunting of costophrenic angles. No pneumothorax. Old right rib fractures. IMPRESSION: Appliances are in satisfactory position. No evidence of active pulmonary disease. Electronically Signed   By: Lucienne Capers M.D.   On: 03/31/2016 06:41   Dg Chest Portable 1 View  Result Date: 03/30/2016 CLINICAL  DATA:  Hypoxia EXAM: PORTABLE CHEST 1 VIEW COMPARISON:  None. FINDINGS: Endotracheal tube tip is 2.9 cm above the carina. Nasogastric tube tip is in the proximal stomach with the side port at the gastroesophageal junction. No pneumothorax. No edema or consolidation. Heart size and pulmonary vascularity are normal. No adenopathy. There is degenerative change in each shoulder. There old healed rib fractures on the right. IMPRESSION: Tube positions as described without pneumothorax. No edema or consolidation. Electronically Signed   By: Lowella Grip III M.D.   On: 03/30/2016 13:05   Dg Abd Portable 1 View  Result Date: 03/30/2016 CLINICAL DATA:  Nasogastric tube placement EXAM: PORTABLE ABDOMEN  - 1 VIEW COMPARISON:  None. FINDINGS: Nasogastric tube tip is in the proximal stomach with the side port at the gastroesophageal junction. Visualized bowel gas pattern is normal. No bowel obstruction or free air evident. Lung bases clear. IMPRESSION: Nasogastric tube tip in proximal stomach. The side port for the nasogastric tube is at the gastroesophageal junction. Advise advancing nasogastric tube 5-6 cm to insure placement of tube tip and side port in stomach. Bowel gas pattern unremarkable. Electronically Signed   By: Lowella Grip III M.D.   On: 03/30/2016 13:03   STUDIES:  None  CULTURES: None  ANTIBIOTICS: None  SIGNIFICANT EVENTS: 11/24-Pt admitted to [facility] ICU due to intentional overdose following ingestion of Benadryl and questionableTylenol ingestion requiring mechanical intubation for airway protection  LINES/TUBES: PIV's x2 11/24  ASSESSMENT / PLAN:  PULMONARY A: Mechanical ventilation for airway protection following intentional drug overdose P:   Full vent support wean as tolerated  VAP bundle Maintain O2 sats >92% Prn bronchodilators Intermittent CXR and ABG's  CARDIOVASCULAR A:  No acute issues  Hx: HTN and Heart valve problems P:  Trend troponin's Continuous telemetry monitoring EKG q6hrs  RENAL A:   No acute issues P:   Trend BMP's Replace electrolytes as indicated Monitor UOP Lactated Ringer's @100  ml/hr  GASTROINTESTINAL A:   No acute issues P:   Pepcid for PUD prophylaxis Keep NPO for now  HEMATOLOGIC A:   Elevated Acetaminophen level secondary to questionable Tylenol ingestion P:  Will start acetylcysteine  Trend liver enzyme levels daily Lovenox for VTE prophylaxis Trend CBC's Transfuse for hgb <7  INFECTIOUS A:  No acute issues P:   Trend WBC's and monitor fever curve Trend lactic acid's Will obtain pan cultures if pt becomes febrile  ENDOCRINE A:   Hypothyroidism  P:   Continue outpatient synthroid  Monitor serum  glucose Hyper/Hypglycemic Protocol  NEUROLOGIC A:   Acute encephalopathy secondary to intentional drug overdose Hx: Depression, Lupus, and Arthritis  P:   RASS goal: 0 to -1 Continue Propofol gtt to maintain RASS goal  Prn fentanyl and versed   FAMILY  - Updates: No family at bedside to update at this time 03/30/2016  - Inter-disciplinary family meet or Palliative Care meeting due by:  02/05/2016    Marda Stalker, M.D.  03/31/2016   Critical Care Attestation.  I have personally obtained a history, examined the patient, evaluated laboratory and imaging results, formulated the assessment and plan and placed orders. The Patient requires high complexity decision making for assessment and support, frequent evaluation and titration of therapies, application of advanced monitoring technologies and extensive interpretation of multiple databases. The patient has critical illness that could lead imminently to failure of 1 or more organ systems and requires the highest level of physician preparedness to intervene.  Critical Care Time devoted to patient care services  described in this note is 35 minutes and is exclusive of time spent in procedures.   Discussed with Dr. Margaretmary Eddy to transfer to hospitalist service.

## 2016-03-31 NOTE — Progress Notes (Signed)
Admission On assessment patient anxious and in pain. Patient says that she had gastric bypass surgery in 2006 and that her stomach has had pain ever since. Patient also said that she was feeling nauseous, RN administered IV Zofran. Patient complaining of constipation, patient does not have an order for laxative or stool softeners at this time. MD paged but Rn have not received response at this time. RN and patient sitter helped patient to the [facility]. Patient urinated. Patient lung sounds clear/diminished. Heart sounds normal. Patient  Normal sinus on telemetry at this time.   Deri Fuelling, RN

## 2016-03-31 NOTE — Progress Notes (Signed)
RN spoke with ICU MD and ordered milk of mag for moderate constipation. MD aware of patient's abdominal pain. MD will let hospitalist assess patient tomorrow before starting home medications.  Deri Fuelling, RN

## 2016-04-01 ENCOUNTER — Encounter: Payer: Self-pay | Admitting: Cardiology

## 2016-04-01 ENCOUNTER — Inpatient Hospital Stay: Payer: Medicaid Other

## 2016-04-01 LAB — COMPREHENSIVE METABOLIC PANEL
ALT: 9 U/L — ABNORMAL LOW (ref 14–54)
AST: 18 U/L (ref 15–41)
Albumin: 3 g/dL — ABNORMAL LOW (ref 3.5–5.0)
Alkaline Phosphatase: 67 U/L (ref 38–126)
Anion gap: 5 (ref 5–15)
BUN: 13 mg/dL (ref 6–20)
CO2: 23 mmol/L (ref 22–32)
Calcium: 8.3 mg/dL — ABNORMAL LOW (ref 8.9–10.3)
Chloride: 110 mmol/L (ref 101–111)
Creatinine, Ser: 0.56 mg/dL (ref 0.44–1.00)
GFR calc Af Amer: >60 mL/min (ref >60)
GFR calc non Af Amer: >60 mL/min (ref >60)
Glucose, Bld: 153 mg/dL — ABNORMAL HIGH (ref 65–99)
Potassium: 3.5 mmol/L (ref 3.5–5.1)
Sodium: 138 mmol/L (ref 135–145)
Total Bilirubin: 0.4 mg/dL (ref 0.3–1.2)
Total Protein: 5.6 g/dL — ABNORMAL LOW (ref 6.5–8.1)

## 2016-04-01 LAB — GLUCOSE, CAPILLARY
Glucose-Capillary: 125 mg/dL — ABNORMAL HIGH (ref 65–99)
Glucose-Capillary: 141 mg/dL — ABNORMAL HIGH (ref 65–99)
Glucose-Capillary: 62 mg/dL — ABNORMAL LOW (ref 65–99)
Glucose-Capillary: 66 mg/dL (ref 65–99)
Glucose-Capillary: 67 mg/dL (ref 65–99)
Glucose-Capillary: 76 mg/dL (ref 65–99)
Glucose-Capillary: 78 mg/dL (ref 65–99)
Glucose-Capillary: 83 mg/dL (ref 65–99)
Glucose-Capillary: 99 mg/dL (ref 65–99)

## 2016-04-01 LAB — BASIC METABOLIC PANEL
Anion gap: 5 (ref 5–15)
BUN: 13 mg/dL (ref 6–20)
CO2: 22 mmol/L (ref 22–32)
Calcium: 8 mg/dL — ABNORMAL LOW (ref 8.9–10.3)
Chloride: 113 mmol/L — ABNORMAL HIGH (ref 101–111)
Creatinine, Ser: 0.51 mg/dL (ref 0.44–1.00)
GFR calc Af Amer: >60 mL/min (ref >60)
GFR calc non Af Amer: >60 mL/min (ref >60)
Glucose, Bld: 138 mg/dL — ABNORMAL HIGH (ref 65–99)
Potassium: 3.2 mmol/L — ABNORMAL LOW (ref 3.5–5.1)
Sodium: 140 mmol/L (ref 135–145)

## 2016-04-01 LAB — CBC
HCT: 30.7 % — ABNORMAL LOW (ref 35.0–47.0)
Hemoglobin: 10.2 g/dL — ABNORMAL LOW (ref 12.0–16.0)
MCH: 29.8 pg (ref 26.0–34.0)
MCHC: 33.2 g/dL (ref 32.0–36.0)
MCV: 89.7 fL (ref 80.0–100.0)
Platelets: 117 10*3/uL — ABNORMAL LOW (ref 150–440)
RBC: 3.43 MIL/uL — ABNORMAL LOW (ref 3.80–5.20)
RDW: 16.2 % — ABNORMAL HIGH (ref 11.5–14.5)
WBC: 6.8 10*3/uL (ref 3.6–11.0)

## 2016-04-01 LAB — ACETAMINOPHEN LEVEL: Acetaminophen (Tylenol), Serum: <10 ug/mL — ABNORMAL LOW (ref 10–30)

## 2016-04-01 MED ORDER — GABAPENTIN 800 MG PO TABS
800.0000 mg | ORAL_TABLET | Freq: Two times a day (BID) | ORAL | Status: DC
Start: 2016-04-01 — End: 2016-04-01
  Filled 2016-04-01: qty 1

## 2016-04-01 MED ORDER — LEVOTHYROXINE SODIUM 75 MCG PO TABS
75.0000 ug | ORAL_TABLET | Freq: Every day | ORAL | Status: DC
  Administered 2016-04-02 – 2016-04-03 (×2): 75 ug via ORAL
  Filled 2016-04-01 (×2): qty 1

## 2016-04-01 MED ORDER — FOLIC ACID 1 MG PO TABS
1.0000 mg | ORAL_TABLET | Freq: Every day | ORAL | Status: DC
  Administered 2016-04-01 – 2016-04-03 (×3): 1 mg via ORAL
  Filled 2016-04-01 (×3): qty 1

## 2016-04-01 MED ORDER — SODIUM CHLORIDE 0.9 % IV BOLUS (SEPSIS)
500.0000 mL | Freq: Once | INTRAVENOUS | Status: AC
  Administered 2016-04-01: 500 mL via INTRAVENOUS

## 2016-04-01 MED ORDER — TRAMADOL HCL 50 MG PO TABS
50.0000 mg | ORAL_TABLET | Freq: Four times a day (QID) | ORAL | Status: DC | PRN
  Administered 2016-04-01 – 2016-04-03 (×6): 50 mg via ORAL
  Filled 2016-04-01 (×6): qty 1

## 2016-04-01 MED ORDER — HYDROXYCHLOROQUINE SULFATE 200 MG PO TABS
200.0000 mg | ORAL_TABLET | Freq: Two times a day (BID) | ORAL | Status: DC
Start: 2016-04-01 — End: 2016-04-01

## 2016-04-01 MED ORDER — HYDROXYCHLOROQUINE SULFATE 200 MG PO TABS
400.0000 mg | ORAL_TABLET | Freq: Every day | ORAL | Status: DC
  Administered 2016-04-01 – 2016-04-03 (×3): 400 mg via ORAL
  Filled 2016-04-01 (×3): qty 2

## 2016-04-01 MED ORDER — TRAZODONE HCL 50 MG PO TABS
50.0000 mg | ORAL_TABLET | Freq: Every day | ORAL | Status: DC
  Administered 2016-04-01 – 2016-04-03 (×3): 50 mg via ORAL
  Filled 2016-04-01 (×3): qty 1

## 2016-04-01 MED ORDER — SODIUM CHLORIDE 0.9 % IV SOLN
INTRAVENOUS | Status: DC
  Administered 2016-04-01 – 2016-04-02 (×2): 100 mL/h via INTRAVENOUS
  Administered 2016-04-03: 02:00:00 via INTRAVENOUS

## 2016-04-01 MED ORDER — GABAPENTIN 400 MG PO CAPS
800.0000 mg | ORAL_CAPSULE | Freq: Two times a day (BID) | ORAL | Status: DC
  Administered 2016-04-01 – 2016-04-03 (×5): 800 mg via ORAL
  Filled 2016-04-01 (×5): qty 2

## 2016-04-01 MED ORDER — ALENDRONATE SODIUM 70 MG PO TABS
70.0000 mg | ORAL_TABLET | ORAL | Status: DC
Start: 2016-04-01 — End: 2016-04-01

## 2016-04-01 MED ORDER — BUPROPION HCL ER (XL) 150 MG PO TB24: 150.0000 mg | ORAL_TABLET | Freq: Every day | ORAL | Status: DC

## 2016-04-01 MED ORDER — POTASSIUM CHLORIDE CRYS ER 20 MEQ PO TBCR
40.0000 meq | EXTENDED_RELEASE_TABLET | ORAL | Status: AC
  Administered 2016-04-01 (×2): 40 meq via ORAL
  Filled 2016-04-01 (×2): qty 2

## 2016-04-01 MED ORDER — DULOXETINE HCL 30 MG PO CPEP
30.0000 mg | ORAL_CAPSULE | Freq: Every day | ORAL | Status: DC
  Administered 2016-04-01 – 2016-04-03 (×3): 30 mg via ORAL
  Filled 2016-04-01 (×3): qty 1

## 2016-04-01 MED ORDER — VITAMIN D (ERGOCALCIFEROL) 1.25 MG (50000 UNIT) PO CAPS
50000.0000 [IU] | ORAL_CAPSULE | ORAL | Status: DC
  Administered 2016-04-01: 50000 [IU] via ORAL
  Filled 2016-04-01: qty 1

## 2016-04-01 MED ORDER — IBUPROFEN 400 MG PO TABS
400.0000 mg | ORAL_TABLET | Freq: Four times a day (QID) | ORAL | Status: DC | PRN
  Administered 2016-04-01: 400 mg via ORAL
  Filled 2016-04-01: qty 1

## 2016-04-01 NOTE — Progress Notes (Signed)
Pt's blood pressure 89/41, CBG this time=62. On call MD Pyreddy paged and ordered to give Normal Saline 581ml bolus and change fluids from LR to NS at 154ml/hr thereafter. Above MD also ordered to start implementing hypoglycemic protocol. Will carry out as ordered.

## 2016-04-01 NOTE — Progress Notes (Signed)
RN has called MD Gouru for patient needs 4 times this morning with no response. RN spoke with ICU on call and he is not handling her care anymore. RN finally spoke with MD Bridgett Larsson and he will be coming to assess patient. Patient is in pain and the MD will come assess her before prescribing any more medications  Deri Fuelling, RN

## 2016-04-01 NOTE — Progress Notes (Signed)
Hypoglycemic Event  CBG: 62  Treatment: 15 GM carbohydrate snack  Symptoms: None  Follow-up CBG: Time: 0531 CBG Result: 141  Possible Reasons for Event: Inadequate meal intake  Comments/MD notified: Dr. Estanislado Pandy ordered to start hypoglycemic protocol    Lanell Carpenter, Leverne Humbles, RN

## 2016-04-01 NOTE — Progress Notes (Addendum)
[place] at [place] NAME: Shelly Freeman    MR#:  [id number]  DATE OF BIRTH:  13-Nov-1950  SUBJECTIVE:  CHIEF COMPLAINT:  [name] is admitted to the hospital with intentional drug overdose was intubated, extubated and got transferred to hospitalist service. Patient is actively depressed in tears, reporting she has multiple problems in her life, she has fibromyalgia and left knee pain, asking left knee joint injection. 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  EYES: No blurred or double vision.  EARS, NOSE, AND THROAT: No tinnitus or ear pain.  RESPIRATORY: No cough, shortness of breath, wheezing or hemoptysis.  CARDIOVASCULAR: No chest pain, orthopnea, edema.  GASTROINTESTINAL: No nausea, vomiting, diarrhea or abdominal pain.  GENITOURINARY: No dysuria, hematuria.  ENDOCRINE: No polyuria, nocturia,  HEMATOLOGY: No anemia, easy bruising or bleeding SKIN: No rash or lesion. MUSCULOSKELETAL:Reporting left knee pain  NEUROLOGIC: No tingling, numbness, weakness.  PSYCHIATRY: Patient is reporting depression. Poor by mouth intake  DRUG ALLERGIES:  No Known Allergies  VITALS:  Blood pressure (!) 109/46, pulse 65, temperature 98.1 F (36.7 C), temperature source Oral, resp. rate 18, height 5\' 5"  (1.651 m), weight 72.7 kg (160 lb 3.7 oz), SpO2 99 %.  PHYSICAL EXAMINATION:  GENERAL:  65 y.o.-year-old patient lying in the bed with no acute distress.  EYES: Pupils equal, round, reactive to light and accommodation. No scleral icterus. Extraocular muscles intact.  HEENT: Head atraumatic, normocephalic. Oropharynx and nasopharynx clear.  NECK:  Supple, no jugular venous distention. No thyroid enlargement, no tenderness.  LUNGS: Normal breath sounds bilaterally, no wheezing, rales,rhonchi or crepitation.  No use of accessory muscles of respiration.  CARDIOVASCULAR: S1, S2 normal. No murmurs, rubs, or gallops.  ABDOMEN: Soft, nontender, nondistended. Bowel sounds present. No organomegaly or mass.  EXTREMITIES:Left knee joint is tender with some swelling but no erythema No pedal edema, cyanosis, or clubbing.  NEUROLOGIC: Cranial nerves II through XII are intact. Muscle strength 5/5 in all extremities. Sensation intact. Gait not checked.  PSYCHIATRIC: The patient is alert and oriented x 3. Acute depression SKIN: No obvious rash, lesion, or ulcer.    LABORATORY PANEL:   CBC  Recent Labs Lab 04/01/16 0820  WBC 6.8  HGB 10.2*  HCT 30.7*  PLT 117*   ------------------------------------------------------------------------------------------------------------------  Chemistries   Recent Labs Lab 03/31/16 0154 03/31/16 1707 04/01/16 0556  NA 140  --  140  K 3.3*  --  3.2*  CL 107  --  113*  CO2 23  --  22  GLUCOSE 92  --  138*  BUN 11  --  13  CREATININE 0.48  --  0.51  CALCIUM 8.6*  --  8.0*  MG 1.8 1.8  --   AST 22  --   --   ALT 12*  --   --   ALKPHOS 66  --   --   BILITOT 0.9  --   --    ------------------------------------------------------------------------------------------------------------------  Cardiac Enzymes  Recent Labs Lab 03/31/16 0154  TROPONINI <0.03   ------------------------------------------------------------------------------------------------------------------  RADIOLOGY:  Dg Chest Port 1 View  Result Date: 03/31/2016 CLINICAL DATA:  Intubation. EXAM: PORTABLE CHEST 1 VIEW COMPARISON:  03/30/2016 FINDINGS: Endotracheal tube present with tip measuring 3.5 cm above the carina. Enteric tube tip is off the field of view but below the left hemidiaphragm. Shallow inspiration.  Heart size and pulmonary vascularity are normal. Lungs are clear. No blunting of costophrenic angles. No pneumothorax. Old right rib fractures. IMPRESSION: Appliances are in  satisfactory position. No evidence of active pulmonary disease. Electronically Signed   By: Lucienne Capers M.D.   On: 03/31/2016 06:41    EKG:   Orders placed or performed during the hospital encounter of 03/30/16  . EKG 12-Lead  . EKG 12-Lead  . EKG 12-Lead  . EKG 12-Lead  . EKG 12-Lead  . EKG 12-Lead  . EKG 12-Lead  . EKG 12-Lead    ASSESSMENT AND PLAN:   Patient is actively depressed in tears, reporting she has multiple problems in her life, she has fibromyalgia and left knee pain, asking left knee joint injection. 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 Labs  #History of fibromyalgia and lupus Patient is reporting left leg pain we will get an x-ray Pain management as needed with ibuprofen/tramadol Rheumatology consult is placed Resume Plaquenil home medications  #History of hypothyroidism continue Synthroid Check TSH level  #Essential hypertension blood pressure is stable continue Norvasc    All the records are reviewed and case discussed with Care Management/Social Workerr. Management plans discussed with the patient,she is  in agreement.  CODE STATUS: fc ,IVC  TOTAL TIME TAKING CARE OF THIS PATIENT: 39 minutes.   POSSIBLE D/C IN  ?  DAYS, DEPENDING ON CLINICAL CONDITION.  Note: This dictation was prepared with Dragon dictation along with smaller phrase technology. Any transcriptional errors that result from this process are  unintentional.   Nicholes Mango M.D on 04/01/2016 at 3:11 PM  Between 7am to 6pm - Pager - [phone] After 6pm go to [email or web address] - password [email or web address] [facility]  [place] [facility]  Office  [phone]  CC: Primary care physician; No PCP Per Patient

## 2016-04-01 NOTE — Progress Notes (Signed)
[facility] responded to an OR for Suicidal Thoughts. The RN informed me that the Pt was admitted for a drug overdose. Pt lives with her Rozanna Boer daughter who found the Pt unresponsive on the floor in her home. Pt was "very down" and presented lethargic. Sitter was bedside. [place] attempted to have conversation, but the Pt was responding very slurred and quiet. English is not her primary language and is somewhat broken. CH provided the ministry of listening, presence, and prayer. CH will refer to unit [facility] and is available for follow up as needed.    04/01/16 1800  Clinical Encounter Type  Visited With Patient;Health care provider  Visit Type Initial;Psychological support;Spiritual support  Referral From Nurse  Consult/Referral To Chaplain  Spiritual Encounters  Spiritual Needs Other (Comment) (Suicidal Thoughts)  Stress Factors  Patient Stress Factors Exhausted;Major life changes

## 2016-04-02 DIAGNOSIS — F333 Major depressive disorder, recurrent, severe with psychotic symptoms: Secondary | ICD-10-CM

## 2016-04-02 LAB — GLUCOSE, CAPILLARY
Glucose-Capillary: 134 mg/dL — ABNORMAL HIGH (ref 65–99)
Glucose-Capillary: 76 mg/dL (ref 65–99)
Glucose-Capillary: 80 mg/dL (ref 65–99)
Glucose-Capillary: 83 mg/dL (ref 65–99)
Glucose-Capillary: 99 mg/dL (ref 65–99)

## 2016-04-02 LAB — TSH: TSH: 13.704 u[IU]/mL — ABNORMAL HIGH (ref 0.350–4.500)

## 2016-04-02 LAB — CBC
HCT: 31.6 % — ABNORMAL LOW (ref 35.0–47.0)
Hemoglobin: 10.6 g/dL — ABNORMAL LOW (ref 12.0–16.0)
MCH: 29.2 pg (ref 26.0–34.0)
MCHC: 33.5 g/dL (ref 32.0–36.0)
MCV: 87.2 fL (ref 80.0–100.0)
Platelets: 133 10*3/uL — ABNORMAL LOW (ref 150–440)
RBC: 3.62 MIL/uL — ABNORMAL LOW (ref 3.80–5.20)
RDW: 15.9 % — ABNORMAL HIGH (ref 11.5–14.5)
WBC: 8.7 10*3/uL (ref 3.6–11.0)

## 2016-04-02 LAB — T4, FREE: Free T4: 0.65 ng/dL (ref 0.61–1.12)

## 2016-04-02 NOTE — Progress Notes (Signed)
[place] at [place] NAME: Shelly Freeman    MR#:  [id number]  DATE OF BIRTH:  1950/05/15  SUBJECTIVE:  CHIEF COMPLAINT:  Shelly Freeman is admitted to the hospital with intentional drug overdose was intubated, extubated and got transferred to hospitalist service. Patient is More comfortable today, although admits of significant pain in left knee, is hoping that the rheumatologist is going to help her with intra-articular injection of medications . 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 Initially  REVIEW OF SYSTEMS:  CONSTITUTIONAL: No fever, fatigue or weakness.  EYES: No blurred or double vision.  EARS, NOSE, AND THROAT: No tinnitus or ear pain.  RESPIRATORY: No cough, shortness of breath, wheezing or hemoptysis.  CARDIOVASCULAR: No chest pain, orthopnea, edema.  GASTROINTESTINAL: No nausea, vomiting, diarrhea or abdominal pain.  GENITOURINARY: No dysuria, hematuria.  ENDOCRINE: No polyuria, nocturia,  HEMATOLOGY: No anemia, easy bruising or bleeding SKIN: No rash or lesion. MUSCULOSKELETAL:Reporting left knee pain  NEUROLOGIC: No tingling, numbness, weakness.  PSYCHIATRY: Patient is reporting depression. Poor by mouth intake  DRUG ALLERGIES:  No Known Allergies  VITALS:  Blood pressure (!) 128/59, pulse 66, temperature 99.1 F (37.3 C), temperature source Oral, resp. rate 18, height 5\' 5"  (1.651 m), weight 67.3 kg (148 lb 6.4 oz), SpO2 97 %.  PHYSICAL EXAMINATION:  GENERAL:  65 y.o.-year-old patient lying in the bed with no acute distress, Tearful initially.  EYES: Pupils equal, round, reactive to light and accommodation. No scleral icterus. Extraocular muscles intact.  HEENT: Head atraumatic, normocephalic. Oropharynx and nasopharynx clear.  NECK:  Supple, no jugular venous distention. No thyroid enlargement,  no tenderness.  LUNGS: Normal breath sounds bilaterally, no wheezing, rales,rhonchi or crepitation. No use of accessory muscles of respiration.  CARDIOVASCULAR: S1, S2 normal. No murmurs, rubs, or gallops.  ABDOMEN: Soft, nontender, nondistended. Bowel sounds present. No organomegaly or mass.  EXTREMITIES:Left knee joint is tender with some swelling but no erythema No pedal edema, cyanosis, or clubbing.  NEUROLOGIC: Cranial nerves II through XII are intact. Muscle strength 5/5 in all extremities. Sensation intact. Gait not checked.  PSYCHIATRIC: The patient is alert and oriented x 3. Acute depression SKIN: No obvious rash, lesion, or ulcer.    LABORATORY PANEL:   CBC  Recent Labs Lab 04/02/16 0542  WBC 8.7  HGB 10.6*  HCT 31.6*  PLT 133*   ------------------------------------------------------------------------------------------------------------------  Chemistries   Recent Labs Lab 03/31/16 1707  04/01/16 1636  NA  --   < > 138  K  --   < > 3.5  CL  --   < > 110  CO2  --   < > 23  GLUCOSE  --   < > 153*  BUN  --   < > 13  CREATININE  --   < > 0.56  CALCIUM  --   < > 8.3*  MG 1.8  --   --   AST  --   --  18  ALT  --   --  9*  ALKPHOS  --   --  67  BILITOT  --   --  0.4  < > = values in this interval not displayed. ------------------------------------------------------------------------------------------------------------------  Cardiac Enzymes  Recent Labs Lab 03/31/16 0154  TROPONINI <0.03   ------------------------------------------------------------------------------------------------------------------  RADIOLOGY:  Dg Knee Complete 4 Views Left  Result Date: 04/01/2016 CLINICAL DATA:  Left knee pain, swelling EXAM: LEFT KNEE - COMPLETE 4+ VIEW COMPARISON:  None. FINDINGS: Four views of the left knee submitted. No acute fracture or subluxation. Significant narrowing of lateral joint compartment. There is spurring of lateral femoral condyle and medial  femoral condyle. Spurring of lateral tibial plateau. Significant narrowing of patellofemoral joint space. IMPRESSION: No acute fracture or subluxation. Osteoarthritic changes as described above. Electronically Signed   By: Lahoma Crocker M.D.   On: 04/01/2016 17:32    EKG:   Orders placed or performed during the hospital encounter of 03/30/16  . EKG 12-Lead  . EKG 12-Lead  . EKG 12-Lead  . EKG 12-Lead  . EKG 12-Lead  . EKG 12-Lead  . EKG 12-Lead  . EKG 12-Lead    ASSESSMENT AND PLAN:   Patient is actively depressed in tears, reporting she has multiple problems in her life, she has fibromyalgia and left knee pain, asking left knee joint injection. 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 Awaiting for further recommendations, possibly behavioral medicine unit admission   #Intentional drug overdose with Benadryl and ?Tylenol CMP and acetaminophen levels are normal   #Hypokalemia , resolved  #History of fibromyalgia and lupus Patient is reporting left leg pain,  an x-ray reveals arthritis Pain management as needed with ibuprofen/tramadol Rheumatology consult is placed, pending Resume Plaquenil home medications  #History of hypothyroidism continue Synthroid TSH level was found to be high, however, free T4 was normal, likely sick euthyroid  #Essential hypertension blood pressure is stable continue Norvasc    All the records are reviewed and case discussed with Care Management/Social Workerr. Management plans discussed with the patient,she is  in agreement.  CODE STATUS: fc ,IVC  TOTAL TIME TAKING CARE OF THIS PATIENT: 40 minutes.   POSSIBLE D/C IN  ?  DAYS,  DEPENDING ON CLINICAL CONDITION.  Note: This dictation was prepared with Dragon dictation along with smaller phrase technology. Any transcriptional errors that result from this process are unintentional.   Shelly Freeman M.D on 04/02/2016 at 4:30 PM  Between 7am to 6pm - Pager - [phone] After 6pm go to [email or web address] - password [email or web address] [facility]  [place] [facility]  Office  [phone]  CC: Primary care physician; No PCP Per Patient

## 2016-04-02 NOTE — Consult Note (Signed)
Reason for Consult: Leg pain  Referring Physician: Hospitalist. Dr.V  Dorice Lamas   HPI: Patient is seen with interpreter. Several months of pain in both legs. Worse on the left. Was evaluated in [place]. Had alopecia and joint pain. Thought to have lupus. Was placed on Plaquenil and subcutaneous methotrexate. Subsequent evaluation has shown that her other lupus markers are negative. She remains on Plaquenil Legs bother her. Burning in her feet. Hurts in the thigh into the knee. Points to the left hip and buttock and down the back. Has known osteoarthritis. Had recent x-ray of the left and right knee with degenerative change. Had injection. Did not help. This was done on November 13. She has been on gabapentin. She has not had any prior back surgery. She's not been told that she has spinal stenosis or neuropathy. Recent depression. Had Benadryl overdose. Blood screen also positive for benzodiazepine and opiate. Tylenol level elevated. Received acetylcysteine. Was intubated. Now extubated.  PMH: Osteoarthritis hypothyroid. Depression. Hypertension. He stones.  SURGICAL HISTORY: Appendectomy. Gastric bypass. Breast biopsy.  Family History: No family history of connective tissue disease.  Social History: No cigarettes or alcohol.  Allergies: No Known Allergies  Medications:  Scheduled: . DULoxetine  30 mg Oral Daily  . enoxaparin (LOVENOX) injection  40 mg Subcutaneous Q24H  . famotidine (PEPCID) IV  20 mg Intravenous Q12H  . folic acid  1 mg Oral Daily  . gabapentin  800 mg Oral BID  . hydroxychloroquine  400 mg Oral Daily  . levothyroxine  75 mcg Oral QAC breakfast  . traZODone  50 mg Oral QHS  . Vitamin D (Ergocalciferol)  50,000 Units Oral Q7 days        ROS:No recent rash. No photosensitivity. No hair thinning. No oral ulcers. No shortness of breath. No chest pain.   PHYSICAL EXAM: Blood pressure (!) 128/59, pulse 66, temperature 99.1 F (37.3 C), temperature source  Oral, resp. rate 18, height 5\' 5"  (1.651 m), weight 67.3 kg (148 lb 6.4 oz), SpO2 97 %. Pleasant female. Sclera clear. No rash. Oropharynx clear. Clear chest. No murmur. Nontender abdomen. Good distal pulses. Mild aches range of motion of cervical spine. Mild painful arc of both shoulders. Hands with hypertrophic changes DIPs PIPs. No wrist synovitis. Hips move well. Mildly slight extension lumbar spine. Percussible tenderness in the lumbar spine. Left and right knee had no definite effusion. No significant synovitis. minimal laxity. Ankles and toes without synovitis Neurologic: She has knee and ankle jerks. Some difficulty standing on her toes and on her heels. Seems to have 5 over 5 plantar and dorsiflexor power  Assessment: Leg pain. Worse on the left. Doesn't seem to be all osteoarthritis of the knee. Had recent injection with no improvement. Rule out spinal stenosis. Suspect a component of neuropathy Generalized osteoarthritis hands and knees Recent intentional overdose. Depression.  Recommendations: Consider trial of an anti-inflammatory drug. Consider lumbar spine MRI. No injection of the knee as she recently had that and it was not helpful Recommend physical therapy Recommend follow-up as an outpatient and consider nerve conduction studies to rule out neuropathy  Emmaline Kluver 04/02/2016, 4:52 PM    Kermit Balo

## 2016-04-02 NOTE — Consult Note (Signed)
[facility] Face-to-Face Psychiatry Consult   Reason for Consult:  65 year old woman with a history of depression who was admitted to the hospital after a suicide attempt Referring Physician:  Ether Griffins Patient Identification: Shelly Freeman MRN:  [id number] Principal Diagnosis: Severe recurrent major depression with psychotic features [facility]) Diagnosis:   Patient Active Problem List   Diagnosis Date Noted  . Severe recurrent major depression with psychotic features ([place]) [F33.3] 04/02/2016  . Intentional diphenhydramine overdose ([place]) [T45.0X2A] 03/30/2016    Total Time spent with patient: 1 hour  Subjective:   Shelly Freeman is a 65 y.o. female patient admitted with "I am sad".  HPI:  Patient interviewed with the assistance of hospital interpreter. Chart reviewed. Patient was admitted to the hospital with a belief that she had taken an overdose of diphenhydramine. She was apparently at least transiently intubated before being able to be moved to the floor. She had an elevated acetaminophen level and was treated with acetylcysteine. On interview today the patient claims that she took 11 pills of hydrocodone with Tylenol and did not overdose on Benadryl. In any case she admits that she did it because she is sad and always in pain and wanted to kill her self. Mood is very down all the time. Sleep is poor despite current medication. At times feels like she hears things in her years. Feels hopeless. Consumed with grief about the death of her son who killed himself some years ago. Patient's history is a little hard to follow. She claims that she has seen a psychiatrist who prescribes medicine for her although it's not clear who that is. She denies that she is abusing alcohol or drugs.  Social history: Patient is from [place]. Lives with her granddaughter here locally. Closest living relative is her son who lives in [place].  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 Chronic pain.  Substance abuse history: Denies alcohol or drug abuse  Past Psychiatric History: Patient has had previous suicide attempts at least 2 prior psychiatric hospitalizations. She remembers the medicine Cymbalta but doesn't remember whether any medicine has clearly worked for her.  Risk to Self: Is patient at risk for suicide?: Yes Risk to Others:   Prior Inpatient Therapy:   Prior Outpatient Therapy:    Past Medical History:  Past Medical History:  Diagnosis Date  . Arthritis   . Depression   . Heart valve problem    per grandaughter need surgery but they told her may not survive;  [name] Hypertension   . Hypothyroidism   . Lupus   . Neuromuscular disorder ([place])   . Thyroid disease     Past Surgical History:  Procedure Laterality Date  . APPENDECTOMY    . BREAST LUMPECTOMY    . CESAREAN SECTION    . GASTRIC BYPASS    . TUBAL LIGATION     Family History: History reviewed. No pertinent family history. Family Psychiatric  History: Patient had a son who committed suicide several years ago. Social History:  History  Alcohol Use No     History  Drug Use No    Social History   Social History  . Marital status: Divorced    Spouse name: N/A  . Number of children: N/A  . Years of education: N/A   Social History Main Topics  . Smoking status: Never Smoker  . Smokeless tobacco: Never Used  . Alcohol use No  . Drug use: No  .  Sexual activity: Not Asked   Other Topics Concern  . None   Social History Narrative  . None   Additional Social History:    Allergies:  No Known Allergies  Labs:  Results for orders placed or performed during the hospital encounter of 03/30/16 (from the past 48 hour(s))  Glucose, capillary     Status: None   Collection Time: 03/31/16  7:45 PM  Result Value Ref Range   Glucose-Capillary 74 65 - 99 mg/dL  Glucose, capillary     Status: None   Collection Time:  04/01/16 12:04 AM  Result Value Ref Range   Glucose-Capillary 66 65 - 99 mg/dL   Comment 1 Notify RN    Comment 2 Repeat Test   Glucose, capillary     Status: None   Collection Time: 04/01/16 12:04 AM  Result Value Ref Range   Glucose-Capillary 67 65 - 99 mg/dL  Glucose, capillary     Status: Abnormal   Collection Time: 04/01/16  4:27 AM  Result Value Ref Range   Glucose-Capillary 62 (L) 65 - 99 mg/dL  Glucose, capillary     Status: Abnormal   Collection Time: 04/01/16  5:31 AM  Result Value Ref Range   Glucose-Capillary 141 (H) 65 - 99 mg/dL  Basic metabolic panel     Status: Abnormal   Collection Time: 04/01/16  5:56 AM  Result Value Ref Range   Sodium 140 135 - 145 mmol/L   Potassium 3.2 (L) 3.5 - 5.1 mmol/L   Chloride 113 (H) 101 - 111 mmol/L   CO2 22 22 - 32 mmol/L   Glucose, Bld 138 (H) 65 - 99 mg/dL   BUN 13 6 - 20 mg/dL   Creatinine, Ser 0.51 0.44 - 1.00 mg/dL   Calcium 8.0 (L) 8.9 - 10.3 mg/dL   GFR calc non Af Amer >60 >60 mL/min   GFR calc Af Amer >60 >60 mL/min    Comment: (NOTE) The eGFR has been calculated using the CKD EPI equation. This calculation has not been validated in all clinical situations. eGFR's persistently <60 mL/min signify possible Chronic Kidney Disease.    Anion gap 5 5 - 15  Glucose, capillary     Status: None   Collection Time: 04/01/16  8:03 AM  Result Value Ref Range   Glucose-Capillary 78 65 - 99 mg/dL   Comment 1 Notify RN   CBC     Status: Abnormal   Collection Time: 04/01/16  8:20 AM  Result Value Ref Range   WBC 6.8 3.6 - 11.0 K/uL   RBC 3.43 (L) 3.80 - 5.20 MIL/uL   Hemoglobin 10.2 (L) 12.0 - 16.0 g/dL   HCT 30.7 (L) 35.0 - 47.0 %   MCV 89.7 80.0 - 100.0 fL   MCH 29.8 26.0 - 34.0 pg   MCHC 33.2 32.0 - 36.0 g/dL   RDW 16.2 (H) 11.5 - 14.5 %   Platelets 117 (L) 150 - 440 K/uL  Glucose, capillary     Status: None   Collection Time: 04/01/16 11:31 AM  Result Value Ref Range   Glucose-Capillary 99 65 - 99 mg/dL   Comment 1  Notify RN   Comprehensive metabolic panel     Status: Abnormal   Collection Time: 04/01/16  4:36 PM  Result Value Ref Range   Sodium 138 135 - 145 mmol/L   Potassium 3.5 3.5 - 5.1 mmol/L   Chloride 110 101 - 111 mmol/L   CO2 23 22 - 32 mmol/L  Glucose, Bld 153 (H) 65 - 99 mg/dL   BUN 13 6 - 20 mg/dL   Creatinine, Ser 0.56 0.44 - 1.00 mg/dL   Calcium 8.3 (L) 8.9 - 10.3 mg/dL   Total Protein 5.6 (L) 6.5 - 8.1 g/dL   Albumin 3.0 (L) 3.5 - 5.0 g/dL   AST 18 15 - 41 U/L   ALT 9 (L) 14 - 54 U/L   Alkaline Phosphatase 67 38 - 126 U/L   Total Bilirubin 0.4 0.3 - 1.2 mg/dL   GFR calc non Af Amer >60 >60 mL/min   GFR calc Af Amer >60 >60 mL/min    Comment: (NOTE) The eGFR has been calculated using the CKD EPI equation. This calculation has not been validated in all clinical situations. eGFR's persistently <60 mL/min signify possible Chronic Kidney Disease.    Anion gap 5 5 - 15  Acetaminophen level     Status: Abnormal   Collection Time: 04/01/16  4:36 PM  Result Value Ref Range   Acetaminophen (Tylenol), Serum <10 (L) 10 - 30 ug/mL    Comment:        THERAPEUTIC CONCENTRATIONS VARY SIGNIFICANTLY. A RANGE OF 10-30 ug/mL MAY BE AN EFFECTIVE CONCENTRATION FOR MANY PATIENTS. HOWEVER, SOME ARE BEST TREATED AT CONCENTRATIONS OUTSIDE THIS RANGE. ACETAMINOPHEN CONCENTRATIONS >150 ug/mL AT 4 HOURS AFTER INGESTION AND >50 ug/mL AT 12 HOURS AFTER INGESTION ARE OFTEN ASSOCIATED WITH TOXIC REACTIONS.   Glucose, capillary     Status: Abnormal   Collection Time: 04/01/16  4:51 PM  Result Value Ref Range   Glucose-Capillary 125 (H) 65 - 99 mg/dL   Comment 1 Notify RN   Glucose, capillary     Status: None   Collection Time: 04/01/16  8:11 PM  Result Value Ref Range   Glucose-Capillary 83 65 - 99 mg/dL  Glucose, capillary     Status: None   Collection Time: 04/02/16 12:00 AM  Result Value Ref Range   Glucose-Capillary 76 65 - 99 mg/dL  Glucose, capillary     Status: None    Collection Time: 04/02/16  4:09 AM  Result Value Ref Range   Glucose-Capillary 76 65 - 99 mg/dL  CBC     Status: Abnormal   Collection Time: 04/02/16  5:42 AM  Result Value Ref Range   WBC 8.7 3.6 - 11.0 K/uL   RBC 3.62 (L) 3.80 - 5.20 MIL/uL   Hemoglobin 10.6 (L) 12.0 - 16.0 g/dL   HCT 31.6 (L) 35.0 - 47.0 %   MCV 87.2 80.0 - 100.0 fL   MCH 29.2 26.0 - 34.0 pg   MCHC 33.5 32.0 - 36.0 g/dL   RDW 15.9 (H) 11.5 - 14.5 %   Platelets 133 (L) 150 - 440 K/uL  TSH     Status: Abnormal   Collection Time: 04/02/16  5:42 AM  Result Value Ref Range   TSH 13.704 (H) 0.350 - 4.500 uIU/mL    Comment: Performed by a 3rd Generation assay with a functional sensitivity of <=0.01 uIU/mL.  T4, free     Status: None   Collection Time: 04/02/16  5:42 AM  Result Value Ref Range   Free T4 0.65 0.61 - 1.12 ng/dL    Comment: (NOTE) Biotin ingestion may interfere with free T4 tests. If the results are inconsistent with the TSH level, previous test results, or the clinical presentation, then consider biotin interference. If needed, order repeat testing after stopping biotin.   Glucose, capillary     Status: None  Collection Time: 04/02/16  8:03 AM  Result Value Ref Range   Glucose-Capillary 83 65 - 99 mg/dL  Glucose, capillary     Status: None   Collection Time: 04/02/16  4:32 PM  Result Value Ref Range   Glucose-Capillary 99 65 - 99 mg/dL    Current Facility-Administered Medications  Medication Dose Route Frequency Provider Last Rate Last Dose  . 0.9 %  sodium chloride infusion  250 mL Intravenous PRN Awilda Bill, NP      . 0.9 %  sodium chloride infusion   Intravenous Continuous Saundra Shelling, MD 100 mL/hr at 04/02/16 0623 100 mL/hr at 04/02/16 0623  . DULoxetine (CYMBALTA) DR capsule 30 mg  30 mg Oral Daily Nicholes Mango, MD   30 mg at 04/02/16 0908  . enoxaparin (LOVENOX) injection 40 mg  40 mg Subcutaneous Q24H Awilda Bill, NP   40 mg at 04/02/16 1611  . famotidine (PEPCID) IVPB 20 mg  premix  20 mg Intravenous Q12H Awilda Bill, NP   20 mg at 04/02/16 0907  . folic acid (FOLVITE) tablet 1 mg  1 mg Oral Daily Nicholes Mango, MD   1 mg at 04/02/16 0907  . gabapentin (NEURONTIN) capsule 800 mg  800 mg Oral BID Nicholes Mango, MD   800 mg at 04/02/16 0907  . hydroxychloroquine (PLAQUENIL) tablet 400 mg  400 mg Oral Daily Nicholes Mango, MD   400 mg at 04/02/16 0907  . ibuprofen (ADVIL,MOTRIN) tablet 400 mg  400 mg Oral Q6H PRN Nicholes Mango, MD   400 mg at 04/01/16 1954  . ipratropium-albuterol (DUONEB) 0.5-2.5 (3) MG/3ML nebulizer solution 3 mL  3 mL Nebulization Q6H PRN Awilda Bill, NP      . levothyroxine (SYNTHROID, LEVOTHROID) tablet 75 mcg  75 mcg Oral QAC breakfast Nicholes Mango, MD   75 mcg at 04/02/16 0907  . magnesium hydroxide (MILK OF MAGNESIA) suspension 30 mL  30 mL Oral Daily PRN Nicholes Mango, MD   30 mL at 04/02/16 0624  . ondansetron (ZOFRAN) injection 4 mg  4 mg Intravenous Q6H PRN Awilda Bill, NP   4 mg at 04/01/16 1614  . traMADol (ULTRAM) tablet 50 mg  50 mg Oral Q6H PRN Nicholes Mango, MD   50 mg at 04/02/16 1610  . traZODone (DESYREL) tablet 50 mg  50 mg Oral QHS Nicholes Mango, MD   50 mg at 04/01/16 2048  . Vitamin D (Ergocalciferol) (DRISDOL) capsule 50,000 Units  50,000 Units Oral Q7 days Nicholes Mango, MD   50,000 Units at 04/01/16 1817    Musculoskeletal: Strength & Muscle Tone: decreased Gait & Station: Unclear to me how well she can walk Patient leans: N/A  Psychiatric Specialty Exam: Physical Exam  Nursing note and vitals reviewed. Constitutional: She appears well-developed and well-nourished. She appears listless. She appears distressed.  HENT:  Head: Normocephalic and atraumatic.  Eyes: Conjunctivae are normal. Pupils are equal, round, and reactive to light.  Neck: Normal range of motion.  Cardiovascular: Normal heart sounds.   Respiratory: Effort normal.  GI: Soft.  Musculoskeletal: Normal range of motion.  Neurological: She appears listless.   Patient claims that she is unable to walk without unsteadiness and pain. I did not directly tested.  Skin: Skin is warm and dry.  Psychiatric: Her speech is delayed. She is slowed and withdrawn. She expresses impulsivity. She exhibits a depressed mood. She expresses suicidal ideation.    Review of Systems  Constitutional: Positive for malaise/fatigue.  HENT:  Negative.   Eyes: Negative.   Respiratory: Negative.   Cardiovascular: Negative.   Gastrointestinal: Negative.   Musculoskeletal: Positive for joint pain.  Skin: Negative.   Neurological: Positive for weakness.  Psychiatric/Behavioral: Positive for depression, hallucinations, memory loss and suicidal ideas. Negative for substance abuse. The patient is nervous/anxious and has insomnia.     Blood pressure (!) 128/59, pulse 66, temperature 99.1 F (37.3 C), temperature source Oral, resp. rate 18, height _0  (1.651 m), weight 67.3 kg (148 lb 6.4 oz), SpO2 97 %.Body mass index is 24.7 kg/m.  General Appearance: Casual  Eye Contact:  Minimal  Speech:  Slow  Volume:  Decreased  Mood:  Depressed and Dysphoric  Affect:  Blunt  Thought Process:  Goal Directed  Orientation:  Full (Time, Place, and Person)  Thought Content:  Logical and Rumination  Suicidal Thoughts:  Yes.  with intent/plan  Homicidal Thoughts:  No  Memory:  Immediate;   Fair Recent;   Poor Remote;   Fair  Judgement:  Impaired  Insight:  Shallow  Psychomotor Activity:  Decreased  Concentration:  Concentration: Fair  Recall:  [employer] of Knowledge:  Fair  Language:  Fair  Akathisia:  No  Handed:  Right  AIMS (if indicated):     Assets:  Communication Skills Desire for Improvement Housing Social Support  ADL's:  Impaired  Cognition:  Impaired,  Mild  Sleep:        Treatment Plan Summary: Daily contact with patient to assess and evaluate symptoms and progress in treatment, Medication management and Plan 65 year old woman who made a serious suicide  attempt and continues to voice suicidal ideation. Very sad and down and flat. Patient clearly requires inpatient psychiatric hospitalization. Whether she will be appropriate for the unit here depends on whether she can ambulate adequately. I have asked for a physical therapy consult to clear that up. Meanwhile no changed to her current antidepressants or other medicine. Continue on suicide precautions with a sitter. I will follow-up for admission daily.  Disposition: Recommend psychiatric Inpatient admission when medically cleared. Supportive therapy provided about ongoing stressors.  Alethia Berthold, MD 04/02/2016 7:27 PM

## 2016-04-02 NOTE — Progress Notes (Signed)
[place] visited with PT. Sitter was with PT in the room. CH offered kind words for continued health and well-being. PT declined prayer.

## 2016-04-03 ENCOUNTER — Inpatient Hospital Stay
Admission: EM | Admit: 2016-04-03 | Discharge: 2016-04-16 | DRG: 917 | Disposition: A | Discharge status: Home / Self Care | Payer: Medicaid Other | Source: Intra-hospital | Attending: Psychiatry | Admitting: Psychiatry

## 2016-04-03 DIAGNOSIS — L930 Discoid lupus erythematosus: Secondary | ICD-10-CM | POA: Diagnosis present

## 2016-04-03 DIAGNOSIS — F333 Major depressive disorder, recurrent, severe with psychotic symptoms: Secondary | ICD-10-CM | POA: Diagnosis not present

## 2016-04-03 DIAGNOSIS — F22 Delusional disorders: Secondary | ICD-10-CM

## 2016-04-03 DIAGNOSIS — J9601 Acute respiratory failure with hypoxia: Secondary | ICD-10-CM | POA: Diagnosis present

## 2016-04-03 DIAGNOSIS — E876 Hypokalemia: Secondary | ICD-10-CM

## 2016-04-03 DIAGNOSIS — I10 Essential (primary) hypertension: Secondary | ICD-10-CM

## 2016-04-03 DIAGNOSIS — F01A18 Vascular dementia, mild, with other behavioral disturbance: Secondary | ICD-10-CM

## 2016-04-03 DIAGNOSIS — J449 Chronic obstructive pulmonary disease, unspecified: Secondary | ICD-10-CM | POA: Diagnosis present

## 2016-04-03 DIAGNOSIS — Z7901 Long term (current) use of anticoagulants: Secondary | ICD-10-CM | POA: Diagnosis not present

## 2016-04-03 DIAGNOSIS — E039 Hypothyroidism, unspecified: Secondary | ICD-10-CM | POA: Diagnosis present

## 2016-04-03 DIAGNOSIS — T40602A Poisoning by unspecified narcotics, intentional self-harm, initial encounter: Principal | ICD-10-CM | POA: Diagnosis present

## 2016-04-03 DIAGNOSIS — K219 Gastro-esophageal reflux disease without esophagitis: Secondary | ICD-10-CM | POA: Diagnosis present

## 2016-04-03 DIAGNOSIS — F0151 Vascular dementia with behavioral disturbance: Secondary | ICD-10-CM | POA: Diagnosis present

## 2016-04-03 DIAGNOSIS — J209 Acute bronchitis, unspecified: Secondary | ICD-10-CM | POA: Diagnosis present

## 2016-04-03 DIAGNOSIS — M329 Systemic lupus erythematosus, unspecified: Secondary | ICD-10-CM | POA: Diagnosis present

## 2016-04-03 DIAGNOSIS — F329 Major depressive disorder, single episode, unspecified: Secondary | ICD-10-CM | POA: Diagnosis present

## 2016-04-03 DIAGNOSIS — G9341 Metabolic encephalopathy: Secondary | ICD-10-CM

## 2016-04-03 DIAGNOSIS — Z9884 Bariatric surgery status: Secondary | ICD-10-CM

## 2016-04-03 DIAGNOSIS — R262 Difficulty in walking, not elsewhere classified: Secondary | ICD-10-CM

## 2016-04-03 DIAGNOSIS — M79609 Pain in unspecified limb: Secondary | ICD-10-CM

## 2016-04-03 DIAGNOSIS — F29 Unspecified psychosis not due to a substance or known physiological condition: Secondary | ICD-10-CM

## 2016-04-03 DIAGNOSIS — G4700 Insomnia, unspecified: Secondary | ICD-10-CM | POA: Diagnosis present

## 2016-04-03 DIAGNOSIS — J440 Chronic obstructive pulmonary disease with acute lower respiratory infection: Secondary | ICD-10-CM | POA: Diagnosis present

## 2016-04-03 LAB — GLUCOSE, CAPILLARY
Glucose-Capillary: 83 mg/dL (ref 65–99)
Glucose-Capillary: 76 mg/dL (ref 65–99)
Glucose-Capillary: 80 mg/dL (ref 65–99)
Glucose-Capillary: 97 mg/dL (ref 65–99)
Glucose-Capillary: 90 mg/dL (ref 65–99)

## 2016-04-03 MED ORDER — TRAMADOL HCL 50 MG PO TABS: 50.0000 mg | ORAL_TABLET | Freq: Four times a day (QID) | ORAL | 0 refills | Status: DC | PRN

## 2016-04-03 MED ORDER — HYDROXYCHLOROQUINE SULFATE 200 MG PO TABS: 400.0000 mg | ORAL_TABLET | Freq: Every day | ORAL | 6 refills | Status: DC

## 2016-04-03 MED ORDER — FAMOTIDINE 20 MG PO TABS
20.0000 mg | ORAL_TABLET | Freq: Two times a day (BID) | ORAL | Status: DC
  Administered 2016-04-03: 20 mg via ORAL
  Filled 2016-04-03: qty 1

## 2016-04-03 NOTE — Evaluation (Signed)
Physical Therapy Evaluation Patient Details Name: Shelly Freeman MRN: [id number] DOB: 10-Feb-1951 Today's Date: 04/03/2016   History of Present Illness  Pt admitted for severe recurrent depression with psychotic features. Pt overdosed on drugs, required intubation from 11/24-11/25. Pt with safety sitter at this time. Pt with negative knee imaging at this time, complains of knee pain from fibromyalgia. Other PMH includes thyroid dx, arthritis, and lupus.   Clinical Impression  Pt is a pleasant 65 year old female who was admitted for drug overdose. Pt demonstrates all bed mobility/transfers/ambulation at baseline level. Performed ambulation with IV pole along with SPC. Pt safe to ambulate short distances with no AD, however does benefit from [facility] for balance with increased distances, doesn't require RW at this time. She reports she uses SPC at baseline. Pt reports pain in L knee with ambulation, however does not appear to limit mobility. Pt does not require any further PT needs at this time. Pt will be dc in house and does not require follow up. CM aware. Will dc current orders.      Follow Up Recommendations No PT follow up    Equipment Recommendations  None recommended by PT    Recommendations for Other Services       Precautions / Restrictions Precautions Precautions: Fall Restrictions Weight Bearing Restrictions: No      Mobility  Bed Mobility Overal bed mobility: Independent             General bed mobility comments: safe technique performed  Transfers Overall transfer level: Independent Equipment used: None             General transfer comment: Safe technique performed without AD  Ambulation/Gait Ambulation/Gait assistance: Min guard Ambulation Distance (Feet): 80 Feet Assistive device:  (IV pole with B hand support) Gait Pattern/deviations: Step-through pattern   Gait velocity interpretation: Below normal speed for age/gender General Gait Details:  ambulated using IV pole for B UE assistance. Pt demonstrates safe reciprocal gait pattern and no LOB. Further ambulation performed in ther-ex  Stairs            Wheelchair Mobility    Modified Rankin (Stroke Patients Only)       Balance Overall balance assessment: Modified Independent                                           Pertinent Vitals/Pain Pain Assessment: Faces Faces Pain Scale: Hurts a little bit Pain Location: L knee; anterior Pain Descriptors / Indicators: Aching;Discomfort Pain Intervention(s): Limited activity within patient's tolerance    Home Living Family/patient expects to be discharged to:: Private residence Living Arrangements: Children Available Help at Discharge: Family Type of Home: House         Home Equipment: Shelly Freeman - single point Additional Comments: Pt poor historian, unsure of home environment    Prior Function Level of Independence: Independent with assistive device(s)         Comments: uses SPC when she experiences knee pain, otherwise ambulates independently     Hand Dominance        Extremity/Trunk Assessment   Upper Extremity Assessment: Overall WFL for tasks assessed           Lower Extremity Assessment: Overall WFL for tasks assessed         Communication   Communication: Prefers language other than [place] (Spanish speaking, although does speak [place])  Cognition Arousal/Alertness:  Awake/alert Behavior During Therapy: WFL for tasks assessed/performed Overall Cognitive Status: Within Functional Limits for tasks assessed                      General Comments      Exercises Other Exercises Other Exercises: Further ambulation performed x 100' with SPC. Reciprocal gait pattern performed with no LOB noted. Slight improved gait speed noted.    Assessment/Plan    PT Assessment Patent does not need any further PT services  PT Problem List            PT Treatment Interventions       PT Goals (Current goals can be found in the Care Plan section)  Acute Rehab PT Goals Patient Stated Goal: to get well PT Goal Formulation: With patient Time For Goal Achievement: Apr 12, 2016 Potential to Achieve Goals: Good    Frequency     Barriers to discharge        Co-evaluation               End of Session Equipment Utilized During Treatment: Gait belt Activity Tolerance: Patient tolerated treatment well Patient left: in bed;with nursing/sitter in room Nurse Communication: Mobility status         Time: [id number] PT Time Calculation (min) (ACUTE ONLY): 17 min   Charges:   PT Evaluation $PT Eval Low Complexity: 1 Procedure PT Treatments $Gait Training: 8-22 mins   PT G Codes:        Avrum Kimball 04-12-2016, 12:31 PM Greggory Stallion, PT, DPT [phone]

## 2016-04-03 NOTE — Plan of Care (Signed)
Problem: Spiritual Needs Goal: Ability to function at adequate level Outcome: Adequate for Discharge Shelly Freeman is ready for d/c ot behavioral health.

## 2016-04-03 NOTE — Consult Note (Signed)
[facility] Face-to-Face Psychiatry Consult   Reason for Consult:  65 year old woman with a history of depression who was admitted to the hospital after a suicide attempt Referring Physician:  Ether Griffins Patient Identification: Taylormarie Ellyson MRN:  [id number] Principal Diagnosis: Intentional opiate overdose [facility]) Diagnosis:   Patient Active Problem List   Diagnosis Date Noted  . Acute respiratory failure with hypoxia ([place]) [J96.01] 04/03/2016  . Encephalopathy, metabolic [id number]  . Hypokalemia [E87.6] 04/03/2016  . Pain in limb [M79.609] 04/03/2016  . Hypothyroidism [E03.9] 04/03/2016  . Essential hypertension [I10] 04/03/2016  . Severe recurrent major depression with psychotic features ([place]) [F33.3] 04/02/2016  . Intentional opiate overdose ([place]) [T40.602A] 03/30/2016    Total Time spent with patient: 20 minutes  Subjective:   Harlym Mizerak is a 65 y.o. female patient admitted with "I am sad".  Follow-up Tuesday the 28th. Patient once again interviewed with a hospital Spanish language interpreter. Patient states she is still feeling very sad all the time. Wishes that she were dead. Feels down and miserable and negative. Physically she is much stronger. Physical therapy has stated that she can ambulate normally and I have seen her walking around the unit. Patient appears to be off of all intravenous medications and no longer requiring specific treatment on the medical floor.  HPI:  Patient interviewed with the assistance of hospital interpreter. Chart reviewed. Patient was admitted to the hospital with a belief that she had taken an overdose of diphenhydramine. She was apparently at least transiently intubated before being able to be moved to the floor. She had an elevated acetaminophen level and was treated with acetylcysteine. On interview today the patient claims that she took 11 pills of hydrocodone with Tylenol and did not overdose on Benadryl. In any case she admits that she  did it because she is sad and always in pain and wanted to kill her self. Mood is very down all the time. Sleep is poor despite current medication. At times feels like she hears things in her years. Feels hopeless. Consumed with grief about the death of her son who killed himself some years ago. Patient's history is a little hard to follow. She claims that she has seen a psychiatrist who prescribes medicine for her although it's not clear who that is. She denies that she is abusing alcohol or drugs.  Social history: Patient is from [place]. Lives with her granddaughter here locally. Closest living relative is her son who lives in [place].  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 Chronic pain.  Substance abuse history: Denies alcohol or drug abuse  Past Psychiatric History: Patient has had previous suicide attempts at least 2 prior psychiatric hospitalizations. She remembers the medicine Cymbalta but doesn't remember whether any medicine has clearly worked for her.  Risk to Self: Is patient at risk for suicide?: Yes Risk to Others:   Prior Inpatient Therapy:   Prior Outpatient Therapy:    Past Medical History:  Past Medical History:  Diagnosis Date  . Arthritis   . Depression   . Heart valve problem    per grandaughter need surgery but they told her may not survive;  [name] Hypertension   . Hypothyroidism   . Lupus   . Neuromuscular disorder ([place])   . Thyroid disease     Past Surgical History:  Procedure Laterality Date  . APPENDECTOMY    . BREAST LUMPECTOMY    . CESAREAN SECTION    .  GASTRIC BYPASS    . TUBAL LIGATION     Family History: History reviewed. No pertinent family history. Family Psychiatric  History: Patient had a son who committed suicide several years ago. Social History:  History  Alcohol Use No     History  Drug Use No    Social History   Social History   . Marital status: Divorced    Spouse name: N/A  . Number of children: N/A  . Years of education: N/A   Social History Main Topics  . Smoking status: Never Smoker  . Smokeless tobacco: Never Used  . Alcohol use No  . Drug use: No  . Sexual activity: Not Asked   Other Topics Concern  . None   Social History Narrative  . None   Additional Social History:    Allergies:  No Known Allergies  Labs:  Results for orders placed or performed during the hospital encounter of 03/30/16 (from the past 48 hour(s))  Glucose, capillary     Status: None   Collection Time: 04/01/16  8:11 PM  Result Value Ref Range   Glucose-Capillary 83 65 - 99 mg/dL  Glucose, capillary     Status: None   Collection Time: 04/02/16 12:00 AM  Result Value Ref Range   Glucose-Capillary 76 65 - 99 mg/dL  Glucose, capillary     Status: None   Collection Time: 04/02/16  4:09 AM  Result Value Ref Range   Glucose-Capillary 76 65 - 99 mg/dL  CBC     Status: Abnormal   Collection Time: 04/02/16  5:42 AM  Result Value Ref Range   WBC 8.7 3.6 - 11.0 K/uL   RBC 3.62 (L) 3.80 - 5.20 MIL/uL   Hemoglobin 10.6 (L) 12.0 - 16.0 g/dL   HCT 31.6 (L) 35.0 - 47.0 %   MCV 87.2 80.0 - 100.0 fL   MCH 29.2 26.0 - 34.0 pg   MCHC 33.5 32.0 - 36.0 g/dL   RDW 15.9 (H) 11.5 - 14.5 %   Platelets 133 (L) 150 - 440 K/uL  TSH     Status: Abnormal   Collection Time: 04/02/16  5:42 AM  Result Value Ref Range   TSH 13.704 (H) 0.350 - 4.500 uIU/mL    Comment: Performed by a 3rd Generation assay with a functional sensitivity of <=0.01 uIU/mL.  T4, free     Status: None   Collection Time: 04/02/16  5:42 AM  Result Value Ref Range   Free T4 0.65 0.61 - 1.12 ng/dL    Comment: (NOTE) Biotin ingestion may interfere with free T4 tests. If the results are inconsistent with the TSH level, previous test results, or the clinical presentation, then consider biotin interference. If needed, order repeat testing after stopping biotin.    Glucose, capillary     Status: None   Collection Time: 04/02/16  8:03 AM  Result Value Ref Range   Glucose-Capillary 83 65 - 99 mg/dL  Glucose, capillary     Status: None   Collection Time: 04/02/16  4:32 PM  Result Value Ref Range   Glucose-Capillary 99 65 - 99 mg/dL  Glucose, capillary     Status: Abnormal   Collection Time: 04/02/16  7:59 PM  Result Value Ref Range   Glucose-Capillary 134 (H) 65 - 99 mg/dL   Comment 1 Notify RN   Glucose, capillary     Status: None   Collection Time: 04/02/16 11:45 PM  Result Value Ref Range   Glucose-Capillary 80 65 -  99 mg/dL  Glucose, capillary     Status: None   Collection Time: 04/03/16  3:58 AM  Result Value Ref Range   Glucose-Capillary 83 65 - 99 mg/dL  Glucose, capillary     Status: None   Collection Time: 04/03/16  7:47 AM  Result Value Ref Range   Glucose-Capillary 76 65 - 99 mg/dL  Glucose, capillary     Status: None   Collection Time: 04/03/16 12:33 PM  Result Value Ref Range   Glucose-Capillary 80 65 - 99 mg/dL  Glucose, capillary     Status: None   Collection Time: 04/03/16  4:31 PM  Result Value Ref Range   Glucose-Capillary 97 65 - 99 mg/dL    Current Facility-Administered Medications  Medication Dose Route Frequency Provider Last Rate Last Dose  . 0.9 %  sodium chloride infusion  250 mL Intravenous PRN Awilda Bill, NP      . 0.9 %  sodium chloride infusion   Intravenous Continuous Saundra Shelling, MD 100 mL/hr at 04/03/16 1400    . DULoxetine (CYMBALTA) DR capsule 30 mg  30 mg Oral Daily Nicholes Mango, MD   30 mg at 04/03/16 0849  . enoxaparin (LOVENOX) injection 40 mg  40 mg Subcutaneous Q24H Awilda Bill, NP   40 mg at 04/03/16 1402  . famotidine (PEPCID) tablet 20 mg  20 mg Oral BID Theodoro Grist, MD      . folic acid (FOLVITE) tablet 1 mg  1 mg Oral Daily Nicholes Mango, MD   1 mg at 04/03/16 0849  . gabapentin (NEURONTIN) capsule 800 mg  800 mg Oral BID Nicholes Mango, MD   800 mg at 04/03/16 0849  .  hydroxychloroquine (PLAQUENIL) tablet 400 mg  400 mg Oral Daily Nicholes Mango, MD   400 mg at 04/03/16 0849  . ibuprofen (ADVIL,MOTRIN) tablet 400 mg  400 mg Oral Q6H PRN Nicholes Mango, MD   400 mg at 04/01/16 1954  . ipratropium-albuterol (DUONEB) 0.5-2.5 (3) MG/3ML nebulizer solution 3 mL  3 mL Nebulization Q6H PRN Awilda Bill, NP      . levothyroxine (SYNTHROID, LEVOTHROID) tablet 75 mcg  75 mcg Oral QAC breakfast Nicholes Mango, MD   75 mcg at 04/03/16 0718  . magnesium hydroxide (MILK OF MAGNESIA) suspension 30 mL  30 mL Oral Daily PRN Nicholes Mango, MD   30 mL at 04/02/16 0624  . ondansetron (ZOFRAN) injection 4 mg  4 mg Intravenous Q6H PRN Awilda Bill, NP   4 mg at 04/03/16 0848  . traMADol (ULTRAM) tablet 50 mg  50 mg Oral Q6H PRN Nicholes Mango, MD   50 mg at 04/03/16 0718  . traZODone (DESYREL) tablet 50 mg  50 mg Oral QHS Nicholes Mango, MD   50 mg at 04/02/16 2115  . Vitamin D (Ergocalciferol) (DRISDOL) capsule 50,000 Units  50,000 Units Oral Q7 days Nicholes Mango, MD   50,000 Units at 04/01/16 1817    Musculoskeletal: Strength & Muscle Tone: decreased Gait & Station: Unclear to me how well she can walk Patient leans: N/A  Psychiatric Specialty Exam: Physical Exam  Nursing note and vitals reviewed. Constitutional: She appears well-developed and well-nourished. She appears listless. She appears distressed.  HENT:  Head: Normocephalic and atraumatic.  Eyes: Conjunctivae are normal. Pupils are equal, round, and reactive to light.  Neck: Normal range of motion.  Cardiovascular: Normal heart sounds.   Respiratory: Effort normal.  GI: Soft.  Musculoskeletal: Normal range of motion.  Neurological: She  appears listless.  Patient claims that she is unable to walk without unsteadiness and pain. I did not directly tested.  Skin: Skin is warm and dry.  Psychiatric: Her speech is delayed. She is slowed and withdrawn. She expresses impulsivity. She exhibits a depressed mood. She expresses  suicidal ideation.    Review of Systems  Constitutional: Positive for malaise/fatigue.  HENT: Negative.   Eyes: Negative.   Respiratory: Negative.   Cardiovascular: Negative.   Gastrointestinal: Negative.   Musculoskeletal: Positive for joint pain.  Skin: Negative.   Neurological: Positive for weakness.  Psychiatric/Behavioral: Positive for depression, hallucinations, memory loss and suicidal ideas. Negative for substance abuse. The patient is nervous/anxious and has insomnia.     Blood pressure 131/72, pulse 69, temperature 98 F (36.7 C), temperature source Oral, resp. rate 18, height 5\' 5"  (1.651 m), weight 63.1 kg (139 lb 2 oz), SpO2 95 %.Body mass index is 23.15 kg/m.  General Appearance: Casual  Eye Contact:  Minimal  Speech:  Slow  Volume:  Decreased  Mood:  Depressed and Dysphoric  Affect:  Blunt  Thought Process:  Goal Directed  Orientation:  Full (Time, Place, and Person)  Thought Content:  Logical and Rumination  Suicidal Thoughts:  Yes.  with intent/plan  Homicidal Thoughts:  No  Memory:  Immediate;   Fair Recent;   Poor Remote;   Fair  Judgement:  Impaired  Insight:  Shallow  Psychomotor Activity:  Decreased  Concentration:  Concentration: Fair  Recall:  [employer] of Knowledge:  Fair  Language:  Fair  Akathisia:  No  Handed:  Right  AIMS (if indicated):     Assets:  Communication Skills Desire for Improvement Housing Social Support  ADL's:  Impaired  Cognition:  Impaired,  Mild  Sleep:        Treatment Plan Summary: Daily contact with patient to assess and evaluate symptoms and progress in treatment, Medication management and Plan Patient was severe major depression and suicidal ideation requires hospital level treatment. Case reviewed with TTS and nursing. Recommend transfer to the psychiatric unit. I will put in discharge readmission orders. Patient should be safe for transfer downstairs as soon as her current unit is ready.  Disposition:  Recommend psychiatric Inpatient admission when medically cleared. Supportive therapy provided about ongoing stressors.  Alethia Berthold, MD 04/03/2016 6:28 PM

## 2016-04-03 NOTE — Progress Notes (Signed)
CONCERNING: IV to Oral Route Change Policy  RECOMMENDATION: This patient is receiving famotidine by the intravenous route.  Based on criteria approved by the Pharmacy and Therapeutics Committee, the intravenous medication(s) is/are being converted to the equivalent oral dose form(s).   DESCRIPTION: These criteria include:  The patient is eating (either orally or via tube) and/or has been taking other orally administered medications for a least 24 hours  The patient has no evidence of active gastrointestinal bleeding or impaired GI absorption (gastrectomy, short bowel, patient on TNA or NPO).  If you have questions about this conversion, please contact the Pharmacy Department  []   [phone] )  [facility] [x]   [phone] )  [facility] []   [phone] )  [facility] []   [phone] )  [facility] []   [phone] )  [place], [facility] 04/03/2016 9:23 AM

## 2016-04-03 NOTE — Progress Notes (Signed)
Shift assessment completed at 0815. Pt c/o pain to her r calf and received pain medication ,no fruther complaints this shift. Pt has had sitter with her at all times, and has ambulated in hallway of the unit several times with steady gait noted.PIV 302 intact to R wrist and to lac, this writer removed #22 from R wrist due to pt c/o discomfort. This Probation officer spoke to dr V, and pt is to be dc'd to behavioral health when bed is available.

## 2016-04-03 NOTE — Progress Notes (Signed)
[facility] [place] NAME: Shelly Freeman    MR#:  [id number]  DATE OF BIRTH:  01-06-1951  SUBJECTIVE:  CHIEF COMPLAINT:  [name] is admitted to the hospital with intentional drug overdose With the hydrocodone and was intubated, extubated and got transferred to hospitalist service. Patient is comfortable today, less of pain.  Oral intake is minimal, per patient, due to gastric bypass surgery in the past. The patient was seen by Dr. Felicity Pellegrini, who did not recommend intra-articular injection, he felt that patient has an element of neuropathy, recommended outpatient anti-inflammatory medications, nerve conduction studies, rule out spinal stenosis, consider lumbar spine MRI, recommended physical therapy. Overall the patient feels good, is awaiting for psychiatrist recommendations She lives with her granddaughter, still mourns son's death at age of 13  REVIEW OF SYSTEMS:  CONSTITUTIONAL: No fever, fatigue or weakness.  EYES: No blurred or double vision.  EARS, NOSE, AND THROAT: No tinnitus or ear pain.  RESPIRATORY: No cough, shortness of breath, wheezing or hemoptysis.  CARDIOVASCULAR: No chest pain, orthopnea, edema.  GASTROINTESTINAL: No nausea, vomiting, diarrhea or abdominal pain.  GENITOURINARY: No dysuria, hematuria.  ENDOCRINE: No polyuria, nocturia,  HEMATOLOGY: No anemia, easy bruising or bleeding SKIN: No rash or lesion. MUSCULOSKELETAL:Reporting left knee pain  NEUROLOGIC: No tingling, numbness, weakness.  PSYCHIATRY: Patient is reporting depression. Poor by mouth intake  DRUG ALLERGIES:  No Known Allergies  VITALS:  Blood pressure 134/75, pulse 62, temperature 98.3 F (36.8 C), temperature source Oral, resp. rate 18, height 5\' 5"  (1.651 m), weight 63.1 kg (139 lb 2 oz), SpO2 93 %.  PHYSICAL EXAMINATION:  GENERAL:  65 y.o.-year-old patient lying in the bed with no acute distress, More comfortable in interaction,  eye contact is good EYES: Pupils equal, round, reactive to light and accommodation. No scleral icterus. Extraocular muscles intact.  HEENT: Head atraumatic, normocephalic. Oropharynx and nasopharynx clear.  NECK:  Supple, no jugular venous distention. No thyroid enlargement, no tenderness.  LUNGS: Normal breath sounds bilaterally, no wheezing, rales,rhonchi or crepitation. No use of accessory muscles of respiration.  CARDIOVASCULAR: S1, S2 normal. No murmurs, rubs, or gallops.  ABDOMEN: Soft, nontender, nondistended. Bowel sounds present. No organomegaly or mass.  EXTREMITIES:Left knee joint is tender with some swelling but no erythema No pedal edema, cyanosis, or clubbing.  NEUROLOGIC: Cranial nerves II through XII are intact. Muscle strength 5/5 in all extremities. Sensation intact. Gait not checked.  PSYCHIATRIC: The patient is alert and oriented x 3. Good eye contact SKIN: No obvious rash, lesion, or ulcer.    LABORATORY PANEL:   CBC  Recent Labs Lab 04/02/16 0542  WBC 8.7  HGB 10.6*  HCT 31.6*  PLT 133*   ------------------------------------------------------------------------------------------------------------------  Chemistries   Recent Labs Lab 03/31/16 1707  04/01/16 1636  NA  --   < > 138  K  --   < > 3.5  CL  --   < > 110  CO2  --   < > 23  GLUCOSE  --   < > 153*  BUN  --   < > 13  CREATININE  --   < > 0.56  CALCIUM  --   < > 8.3*  MG 1.8  --   --   AST  --   --  18  ALT  --   --  9*  ALKPHOS  --   --  67  BILITOT  --   --  0.4  < > =  values in this interval not displayed. ------------------------------------------------------------------------------------------------------------------  Cardiac Enzymes  Recent Labs Lab 03/31/16 0154  TROPONINI <0.03   ------------------------------------------------------------------------------------------------------------------  RADIOLOGY:  No results found.  EKG:   Orders placed or performed during the  hospital encounter of 03/30/16  . EKG 12-Lead  . EKG 12-Lead  . EKG 12-Lead  . EKG 12-Lead  . EKG 12-Lead  . EKG 12-Lead  . EKG 12-Lead  . EKG 12-Lead    ASSESSMENT AND PLAN:    #Acute depression with intentional drug overdose with 11 pills of hydrocodone/Tylenol, status post intubation and extubation Transferred from ICU to MedSurg unit IVC, one-on-one observation Psychiatry consult is appreciated Resumed Cymbalta and trazodone daily at bedtime. Still remains very depressed, withdrawn, but better recognition. Awaiting for further recommendations, behavioral medicine unit admission is pending. She was able to ambulate, should be no barriers to transfer the patient to behavioral medicine unit.    #Intentional drug overdose with hydrocodone/Tylenol, not Benadryl as previously thought CMP and acetaminophen levels are normal now, patient received acetylcysteine initially   #Hypokalemia , resolved  #History of fibromyalgia and lupus Patient is reporting left leg pain,  an x-ray reveals arthritic changes Pain management as needed with ibuprofen/tramadol, also Plaquenil Rheumatology consult is appreciated, physical therapy saw patient in consultation, no physical therapy follow-up was recommended, follow clinically   #History of hypothyroidism continue Synthroid TSH level was found to be high, however, free T4 was normal, likely sick euthyroid  #Essential hypertension blood pressure is stable continue Norvasc    All the records are reviewed and case discussed with Care Management/Social Workerr. Management plans discussed with the patient,she is  in agreement.  CODE STATUS: fc ,IVC  TOTAL TIME TAKING CARE OF THIS PATIENT: 35 minutes.   POSSIBLE D/C IN  ?  DAYS, DEPENDING ON CLINICAL CONDITION.  Note: This dictation was prepared with Dragon dictation along with smaller phrase technology. Any transcriptional errors that result from this process are  unintentional.   Timmi Devora M.D on 04/03/2016 at 4:52 PM  Between 7am to 6pm - Pager - [phone] After 6pm go to [email or web address] - password [email or web address]  [facility]  Office  [phone]  CC: Primary care physician; No PCP Per Patient

## 2016-04-04 DIAGNOSIS — J449 Chronic obstructive pulmonary disease, unspecified: Secondary | ICD-10-CM

## 2016-04-04 DIAGNOSIS — K219 Gastro-esophageal reflux disease without esophagitis: Secondary | ICD-10-CM | POA: Diagnosis present

## 2016-04-04 DIAGNOSIS — J209 Acute bronchitis, unspecified: Secondary | ICD-10-CM | POA: Diagnosis present

## 2016-04-04 DIAGNOSIS — M329 Systemic lupus erythematosus, unspecified: Secondary | ICD-10-CM | POA: Diagnosis present

## 2016-04-04 DIAGNOSIS — F333 Major depressive disorder, recurrent, severe with psychotic symptoms: Secondary | ICD-10-CM

## 2016-04-04 DIAGNOSIS — J440 Chronic obstructive pulmonary disease with acute lower respiratory infection: Secondary | ICD-10-CM | POA: Diagnosis present

## 2016-04-04 LAB — CREATININE, SERUM
Creatinine, Ser: 0.57 mg/dL (ref 0.44–1.00)
GFR calc Af Amer: >60 mL/min (ref >60)
GFR calc non Af Amer: >60 mL/min (ref >60)

## 2016-04-04 LAB — LIPID PANEL
Cholesterol: 210 mg/dL — ABNORMAL HIGH (ref 0–200)
HDL: 72 mg/dL (ref >40)
LDL Cholesterol: 113 mg/dL — ABNORMAL HIGH (ref 0–99)
Total CHOL/HDL Ratio: 2.9 RATIO
Triglycerides: 126 mg/dL (ref <150)
VLDL: 25 mg/dL (ref 0–40)

## 2016-04-04 MED ORDER — AMLODIPINE BESYLATE 5 MG PO TABS
5.0000 mg | ORAL_TABLET | Freq: Every day | ORAL | Status: DC
  Administered 2016-04-04 – 2016-04-16 (×11): 5 mg via ORAL
  Filled 2016-04-04 (×11): qty 1

## 2016-04-04 MED ORDER — TRAMADOL HCL 50 MG PO TABS: 50.0000 mg | ORAL_TABLET | Freq: Four times a day (QID) | ORAL | Status: DC | PRN

## 2016-04-04 MED ORDER — ALUM & MAG HYDROXIDE-SIMETH 200-200-20 MG/5ML PO SUSP: 30.0000 mL | ORAL | Status: DC | PRN

## 2016-04-04 MED ORDER — MAGNESIUM HYDROXIDE 400 MG/5ML PO SUSP: 30.0000 mL | Freq: Every day | ORAL | Status: DC | PRN

## 2016-04-04 MED ORDER — HYDROXYZINE HCL 50 MG PO TABS
50.0000 mg | ORAL_TABLET | Freq: Four times a day (QID) | ORAL | Status: DC | PRN
  Administered 2016-04-04: 50 mg via ORAL
  Filled 2016-04-04: qty 1

## 2016-04-04 MED ORDER — IBUPROFEN 400 MG PO TABS
800.0000 mg | ORAL_TABLET | Freq: Four times a day (QID) | ORAL | Status: DC
  Administered 2016-04-04 – 2016-04-16 (×40): 800 mg via ORAL
  Filled 2016-04-04 (×42): qty 2

## 2016-04-04 MED ORDER — HYDROXYCHLOROQUINE SULFATE 200 MG PO TABS
400.0000 mg | ORAL_TABLET | Freq: Every day | ORAL | Status: DC
  Administered 2016-04-04 – 2016-04-16 (×11): 400 mg via ORAL
  Filled 2016-04-04 (×12): qty 2

## 2016-04-04 MED ORDER — DULOXETINE HCL 30 MG PO CPEP
60.0000 mg | ORAL_CAPSULE | Freq: Every day | ORAL | Status: DC
  Administered 2016-04-07 – 2016-04-16 (×10): 60 mg via ORAL
  Filled 2016-04-04 (×11): qty 2

## 2016-04-04 MED ORDER — GABAPENTIN 400 MG PO CAPS
800.0000 mg | ORAL_CAPSULE | Freq: Two times a day (BID) | ORAL | Status: DC
  Administered 2016-04-04 – 2016-04-16 (×20): 800 mg via ORAL
  Filled 2016-04-04 (×21): qty 2

## 2016-04-04 MED ORDER — FAMOTIDINE 20 MG PO TABS
20.0000 mg | ORAL_TABLET | Freq: Two times a day (BID) | ORAL | Status: DC
  Administered 2016-04-04 – 2016-04-16 (×21): 20 mg via ORAL
  Filled 2016-04-04 (×21): qty 1

## 2016-04-04 MED ORDER — LEVOTHYROXINE SODIUM 75 MCG PO TABS
75.0000 ug | ORAL_TABLET | Freq: Every day | ORAL | Status: DC
  Administered 2016-04-04 – 2016-04-16 (×12): 75 ug via ORAL
  Filled 2016-04-04 (×13): qty 1

## 2016-04-04 MED ORDER — FOLIC ACID 1 MG PO TABS
1.0000 mg | ORAL_TABLET | Freq: Every day | ORAL | Status: DC
  Administered 2016-04-04: 1 mg via ORAL
  Filled 2016-04-04 (×2): qty 1

## 2016-04-04 MED ORDER — TRAZODONE HCL 50 MG PO TABS
50.0000 mg | ORAL_TABLET | Freq: Once | ORAL | Status: AC
  Administered 2016-04-04: 50 mg via ORAL
  Filled 2016-04-04: qty 1

## 2016-04-04 MED ORDER — IBUPROFEN 400 MG PO TABS
400.0000 mg | ORAL_TABLET | Freq: Four times a day (QID) | ORAL | Status: DC | PRN
  Filled 2016-04-04: qty 1

## 2016-04-04 MED ORDER — TRAZODONE HCL 50 MG PO TABS: 50.0000 mg | ORAL_TABLET | Freq: Every day | ORAL | Status: DC

## 2016-04-04 MED ORDER — QUETIAPINE FUMARATE 25 MG PO TABS
50.0000 mg | ORAL_TABLET | Freq: Every day | ORAL | Status: DC
Start: 2016-04-04 — End: 2016-04-05
  Administered 2016-04-04: 50 mg via ORAL
  Filled 2016-04-04: qty 2

## 2016-04-04 MED ORDER — DULOXETINE HCL 30 MG PO CPEP
30.0000 mg | ORAL_CAPSULE | Freq: Every day | ORAL | Status: DC
Start: 2016-04-04 — End: 2016-04-04
  Administered 2016-04-04: 30 mg via ORAL
  Filled 2016-04-04 (×2): qty 1

## 2016-04-04 MED ORDER — ACETAMINOPHEN 325 MG PO TABS: 650.0000 mg | ORAL_TABLET | Freq: Four times a day (QID) | ORAL | Status: DC | PRN

## 2016-04-04 MED ORDER — TRAMADOL HCL 50 MG PO TABS: 50.0000 mg | ORAL_TABLET | Freq: Four times a day (QID) | ORAL | Status: DC

## 2016-04-04 MED ORDER — IPRATROPIUM-ALBUTEROL 0.5-2.5 (3) MG/3ML IN SOLN
3.0000 mL | Freq: Four times a day (QID) | RESPIRATORY_TRACT | Status: DC | PRN
  Filled 2016-04-04: qty 3

## 2016-04-04 MED ORDER — VITAMIN D (ERGOCALCIFEROL) 1.25 MG (50000 UNIT) PO CAPS
50000.0000 [IU] | ORAL_CAPSULE | ORAL | Status: DC
  Administered 2016-04-08 – 2016-04-15 (×2): 50000 [IU] via ORAL
  Filled 2016-04-04 (×2): qty 1

## 2016-04-04 NOTE — [facility] Group Notes (Signed)
[facility] Group Notes:  (Nursing/MHT/Case Management/Adjunct)  Date:  04/04/2016  Time:  4:16 PM  Type of Therapy:  Group Therapy  Participation Level:  Active  Participation Quality:  Attentive  Affect:  Appropriate  Cognitive:  Appropriate  Insight:  Limited  Engagement in Group:  Improving  Modes of Intervention:  Activity  Summary of Progress/Problems:  Shelly Freeman 04/04/2016, 4:16 PM

## 2016-04-04 NOTE — B[facility] Group Notes (Signed)
[facility] LCSW Group Therapy  04/04/2016 3:09 PM  Type of Therapy:  Group Therapy  Participation Level:  Minimal  Participation Quality:  Attentive  Affect:  Appropriate  Cognitive:  Alert  Insight:  Limited  Engagement in Therapy:  Engaged  Modes of Intervention:  Activity, Discussion, Education and Support  Summary of Progress/Problems:Emotional Regulation: Patients will identify both negative and positive emotions. They will discuss emotions they have difficulty regulating and how they impact their lives. Patients will be asked to identify healthy coping skills to combat unhealthy reactions to negative emotions.    Shelly Freeman G. [place], [name] 04/04/2016, 3:10 PM

## 2016-04-04 NOTE — Tx Team (Signed)
Initial Treatment Plan 04/04/2016 3:48 AM Shelly Freeman [id number]    PATIENT STRESSORS: Health problems Loss of a child.  Traumatic event   PATIENT STRENGTHS: General fund of knowledge Religious Affiliation   PATIENT IDENTIFIED PROBLEMS: Suicidal ideation.     Depression                 DISCHARGE CRITERIA:  Improved stabilization in mood, thinking, and/or behavior Medical problems require only outpatient monitoring Motivation to continue treatment in a less acute level of care  PRELIMINARY DISCHARGE PLAN: Outpatient therapy Referrals indicated:  support group for grieving family  PATIENT/FAMILY INVOLVEMENT: This treatment plan has been presented to and reviewed with the patient, Shelly Freeman, The patient and family have been given the opportunity to ask questions and make suggestions.  Harl Bowie, RN 04/04/2016, 3:48 AM
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
## 2016-04-04 NOTE — Progress Notes (Signed)
Recreation Therapy Notes  Date: 11.29.17 Time: 9:30 am Location: Craft Room  Group Topic: Self-esteem  Goal Area(s) Addresses:  Patient will write at least one positive trait about self. Patient will verbalize benefit of having healthy self-esteem.  Behavioral Response: Attentive, Interactive  Intervention: I Am  Activity: Patients were given a worksheet with the letter I on it and were instructed to write positive traits about themselves inside the letter.  Education: LRT educated patients on ways to increase their self-esteem.  Education Outcome: In group clarification offered  Clinical Observations/Feedback: Patient wrote positive traits with assistance from LRT. Patient talked about her son's death. Patient contributed to group discussion.  [name], LRT/CTRS 04/04/2016 10:24 AM

## 2016-04-04 NOTE — Plan of Care (Signed)
Problem: Coping: Goal: Ability to verbalize frustrations and anger appropriately will improve Outcome: Progressing Patient verbalized frustration through interpreter.

## 2016-04-04 NOTE — [facility] Suicide Risk Assessment (Signed)
[facility] Admission Suicide Risk Assessment   Nursing information obtained from:  Patient Demographic factors:  Unemployed, Low socioeconomic status, Divorced or widowed Current Mental Status:  Self-harm thoughts Loss Factors:  Decline in physical health, Loss of significant relationship Historical Factors:  Prior suicide attempts Risk Reduction Factors:  Religious beliefs about death, Living with another person, especially a relative  Total Time spent with patient: 1 hour Principal Problem: Severe recurrent major depression with psychotic features ([place]) Diagnosis:   Patient Active Problem List   Diagnosis Date Noted  . Severe recurrent major depression with psychotic features ([place]) [F33.3] 04/04/2016  . Lupus [L93.0] 04/04/2016  . GERD (gastroesophageal reflux disease) [K21.9] 04/04/2016  . COPD (chronic obstructive pulmonary disease) ([place]) [J44.9] 04/04/2016  . Acute respiratory failure with hypoxia ([place]) [J96.01] 04/03/2016  . Encephalopathy, metabolic [id number]  . Hypokalemia [E87.6] 04/03/2016  . Pain in limb [M79.609] 04/03/2016  . Hypothyroidism [E03.9] 04/03/2016  . Essential hypertension [I10] 04/03/2016  . Intentional opiate overdose ([place]) [T40.602A] 03/30/2016   Subjective Data: overdose.  Continued Clinical Symptoms:  Alcohol Use Disorder Identification Test Final Score (AUDIT): 0 The "Alcohol Use Disorders Identification Test", Guidelines for Use in Primary Care, Second Edition.  World Pharmacologist [facility]). Score between 0-7:  no or low risk or alcohol related problems. Score between 8-15:  moderate risk of alcohol related problems. Score between 16-19:  high risk of alcohol related problems. Score 20 or above:  warrants further diagnostic evaluation for alcohol dependence and treatment.   CLINICAL FACTORS:   Depression:   Impulsivity Currently Psychotic Previous Psychiatric Diagnoses and Treatments Medical Diagnoses and  Treatments/Surgeries   Musculoskeletal: Strength & Muscle Tone: within normal limits Gait & Station: normal Patient leans: N/A  Psychiatric Specialty Exam: Physical Exam  Nursing note and vitals reviewed.   Review of Systems  Musculoskeletal: Positive for back pain, joint pain and myalgias.  Psychiatric/Behavioral: Positive for depression, hallucinations, memory loss and suicidal ideas. The patient is nervous/anxious and has insomnia.   All other systems reviewed and are negative.   Blood pressure (!) 122/47, pulse 75, temperature 98.2 F (36.8 C), temperature source Oral, resp. rate 18, height 5\' 1"  (1.549 m), weight 69.9 kg (154 lb), SpO2 98 %.Body mass index is 29.1 kg/m.  General Appearance: Casual  Eye Contact:  Good  Speech:  Clear and Coherent  Volume:  Decreased  Mood:  Depressed  Affect:  Blunt  Thought Process:  Goal Directed and Descriptions of Associations: Intact  Orientation:  Full (Time, Place, and Person)  Thought Content:  Hallucinations: Auditory Command:  Telling her to hurt herself  Suicidal Thoughts:  Yes.  with intent/plan  Homicidal Thoughts:  No  Memory:  Immediate;   Fair Recent;   Fair Remote;   Fair  Judgement:  Impaired  Insight:  Shallow  Psychomotor Activity:  Decreased  Concentration:  Concentration: Fair and Attention Span: Fair  Recall:  [employer] of Knowledge:  Fair  Language:  Fair  Akathisia:  No  Handed:  Right  AIMS (if indicated):     Assets:  Communication Skills Desire for Improvement Financial Resources/Insurance Housing Resilience Social Support  ADL's:  Intact  Cognition:  WNL  Sleep:  Number of Hours: 4      COGNITIVE FEATURES THAT CONTRIBUTE TO RISK:  None    SUICIDE RISK:   Moderate:  Frequent suicidal ideation with limited intensity, and duration, some specificity in terms of plans, no associated intent, good self-control, limited dysphoria/symptomatology, some risk factors  present, and identifiable  protective factors, including available and accessible social support.   PLAN OF CARE: Hospital admission, medication management, discharge planning.  Ms. Yau is a 65 year old female with a history of psychotic depression admitted after suicide attempt by narcotic overdose in the context of anniversary of major loss.  1. Suicidal ideation. The patient still has passive thoughts of suicide. She is able to contract for safety in the hospital.  2. Mood and psychosis. We will increase Suboxone to 60 mg and start Seroquel at night for psychosis, mood stabilization, sleep.  3. Hypertension. She is on Norvasc.  4. Lupus. She is on Plaquenil.  5. Chronic pain. She refuses to take narcotics and asks for ibuprofen. She is also on Neurontin.  6. Hypothyroidism. She is on Synthroid.  7. GERD. She is on Pepcid.  8. Metabolic syndrome monitoring. Lipid profile, TSH, and hemoglobin A1c are pending.  9. Disposition. She will be discharged to home. She will follow up with [facility].  I certify that inpatient services furnished can reasonably be expected to improve the patient's condition.  Orson Slick, MD 04/04/2016, 12:04 PM
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
## 2016-04-04 NOTE — Progress Notes (Signed)
This Probation officer received a call from [employer] and was informed that she had no IVC paperwork or record of Volunteer consent for patient. I inquired and was given number to call Orthopedics, and spoke with RN Nydia Bouton. Per Nydia Bouton, pt. Has no IVC papers on file.  This Probation officer took Health and safety inspector orm with patient info on it as requested by [employer] nurse for pt. To sigh in a.m. Once alert and oriented. Shelly Freeman, LPC-A, [facility]  Counselor 04/04/2016 3:33 AM

## 2016-04-04 NOTE — Progress Notes (Signed)
Isolates to room.  Denies SI/HI/AVH.  Affect flat and sad.  Support and encouragement offered.  Safety maintained.

## 2016-04-04 NOTE — Discharge Summary (Signed)
[facility] [place] NAME: Shelly Freeman    MR#:  [id number]  DATE OF BIRTH:  1951/03/04  DATE OF ADMISSION:  03/30/2016 ADMITTING PHYSICIAN: Laverle Hobby, MD  DATE OF DISCHARGE: 04/03/2016 11:25 PM  PRIMARY CARE PHYSICIAN: No PCP Per Patient     ADMISSION DIAGNOSIS:  Intentional drug overdose, initial encounter ([place]) [T50.902A]  DISCHARGE DIAGNOSIS:  Principal Problem:   Intentional opiate overdose ([place]) Active Problems:   Acute respiratory failure with hypoxia (HCC)   Encephalopathy, metabolic   Hypokalemia   Pain in limb   Essential hypertension   Hypothyroidism   SECONDARY DIAGNOSIS:   Past Medical History:  Diagnosis Date  . Arthritis   . Depression   . Heart valve problem    per grandaughter need surgery but they told her may not survive;  [name] Hypertension   . Hypothyroidism   . Lupus   . Neuromuscular disorder ([place])   . Thyroid disease     .pro HOSPITAL COURSE:  Shelly Freeman is admitted to the hospital with intentional drug overdose With the hydrocodone and was intubated, extubated and got transferred to hospitalist service. She was noted to be profoundly depressed, was seen by psychiatrist, who recommended BMU abmission, where patient was transferred to.    #Acute depression with intentional drug overdose with 11 pills of hydrocodone/Tylenol, status post intubation and extubation Transferred from ICU to MedSurg unit IVC, one-on-one observation Resumed Cymbalta and trazodone daily. Still remains depressed, withdrawn. She was seen by psychiatrist and recommended behavioral medicine unit admission. She was able to ambulate, as determined by PT.   #Intentional drug overdose with hydrocodone/Tylenol, not Benadryl as previously thought CMP and acetaminophen levels are normal now, patient received acetylcysteine initially   #Hypokalemia , resolved  #History of fibromyalgia and  lupus Patient was reporting left knee pain,  an x-ray reveals arthritic changes Pain management as needed with ibuprofen/tramadol, also Plaquenil to be continued Rheumatology consult is appreciated, recommended to consider lumbar spine MRI. No injection of the knee as she recently had that and it was not helpful.  Recommend follow-up as an outpatient and consider nerve conduction studies to rule out neuropathy. A physical therapist saw patient in consultation, no physical therapy follow-up was recommended, follow as outpatient.    #History of hypothyroidism continue Synthroid TSH level was found to be high, however, free T4 was normal, likely sick euthyroid  #Essential hypertension blood pressure is stable continue Norvasc     DISCHARGE CONDITIONS:   stable  CONSULTS OBTAINED:  Treatment Team:  Emmaline Kluver., MD Gonzella Lex, MD  DRUG ALLERGIES:  No Known Allergies  DISCHARGE MEDICATIONS:   Discharge Medication List as of 04/03/2016 11:25 PM    START taking these medications   Details  traMADol (ULTRAM) 50 MG tablet Take 1 tablet (50 mg total) by mouth every 6 (six) hours as needed for severe pain., Starting Tue 04/03/2016, Normal      CONTINUE these medications which have CHANGED   Details  hydroxychloroquine (PLAQUENIL) 200 MG tablet Take 2 tablets (400 mg total) by mouth daily., Starting Wed 04/04/2016, Print      CONTINUE these medications which have NOT CHANGED   Details  alendronate (FOSAMAX) 70 MG tablet Take 70 mg by mouth once a week. Take with a full glass of water on an empty stomach., Historical Med    amLODipine (NORVASC) 5 MG tablet Take 5 mg by mouth daily., Historical Med  b complex vitamins tablet Take 1 tablet by mouth daily., Historical Med    DULoxetine (CYMBALTA) 30 MG capsule Take 30 mg by mouth daily., Historical Med    folic acid (FOLVITE) 1 MG tablet Take 1 mg by mouth daily., Historical Med    gabapentin (NEURONTIN) 800 MG  tablet Take 800 mg by mouth 2 (two) times daily., Historical Med    HYDROcodone-acetaminophen (NORCO/VICODIN) 5-325 MG tablet Take 1 tablet by mouth 2 (two) times daily as needed for moderate pain., Historical Med    levothyroxine (SYNTHROID, LEVOTHROID) 75 MCG tablet Take 75 mcg by mouth daily before breakfast., Historical Med    traZODone (DESYREL) 50 MG tablet Take 1 tablet (50 mg total) by mouth at bedtime., Starting Sun 01/29/2016, Print    ibuprofen (ADVIL,MOTRIN) 800 MG tablet Take 800 mg by mouth every 8 (eight) hours as needed (pain)., Historical Med    Vitamin D, Ergocalciferol, (DRISDOL) 50000 units CAPS capsule Take 50,000 Units by mouth every 7 (seven) days., Historical Med      STOP taking these medications     acetaminophen (TYLENOL) 500 MG tablet      LORazepam (ATIVAN) 2 MG tablet      ondansetron (ZOFRAN) 4 MG tablet      buPROPion (WELLBUTRIN XL) 150 MG 24 hr tablet          DISCHARGE INSTRUCTIONS:    The patient is to follow up with PCP upon discharge home  If you experience worsening of your admission symptoms, develop shortness of breath, life threatening emergency, suicidal or homicidal thoughts you must seek medical attention immediately by calling 911 or calling your MD immediately  if symptoms less severe.  You Must read complete instructions/literature along with all the possible adverse reactions/side effects for all the Medicines you take and that have been prescribed to you. Take any new Medicines after you have completely understood and accept all the possible adverse reactions/side effects.   Please note  You were cared for by a hospitalist during your hospital stay. If you have any questions about your discharge medications or the care you received while you were in the hospital after you are discharged, you can call the unit and asked to speak with the hospitalist on call if the hospitalist that took care of you is not available. Once you are  discharged, your primary care physician will handle any further medical issues. Please note that NO REFILLS for any discharge medications will be authorized once you are discharged, as it is imperative that you return to your primary care physician (or establish a relationship with a primary care physician if you do not have one) for your aftercare needs so that they can reassess your need for medications and monitor your lab values.    Today   CHIEF COMPLAINT:   Chief Complaint  Patient presents with  . Drug Overdose    HISTORY OF PRESENT ILLNESS:  Shelly Freeman  is a 65 y.o. female with a known history of Thyroid Disease, Lupus, HTN, Heart Valve Problem, Depression, and Arthritis who was admitted to the hospital with intentional drug overdose With the hydrocodone and was intubated, extubated and got transferred to hospitalist service. She was noted to be profoundly depressed, was seen by psychiatrist, who recommended BMU abmission, where patient was transferred to.    #Acute depression with intentional drug overdose with 11 pills of hydrocodone/Tylenol, status post intubation and extubation Transferred from ICU to MedSurg unit IVC, one-on-one observation Resumed Cymbalta and trazodone  daily. Still remains depressed, withdrawn. She was seen by psychiatrist and recommended behavioral medicine unit admission. She was able to ambulate, as determined by PT.   #Intentional drug overdose with hydrocodone/Tylenol, not Benadryl as previously thought CMP and acetaminophen levels are normal now, patient received acetylcysteine initially   #Hypokalemia , resolved  #History of fibromyalgia and lupus Patient was reporting left knee pain,  an x-ray reveals arthritic changes Pain management as needed with ibuprofen/tramadol, also Plaquenil to be continued Rheumatology consult is appreciated, recommended to consider lumbar spine MRI. No injection of the knee as she recently had that and it was  not helpful.  Recommend follow-up as an outpatient and consider nerve conduction studies to rule out neuropathy. A physical therapist saw patient in consultation, no physical therapy follow-up was recommended, follow as outpatient.    #History of hypothyroidism continue Synthroid TSH level was found to be high, however, free T4 was normal, likely sick euthyroid  #Essential hypertension blood pressure is stable continue Norvasc   VITAL SIGNS:  Blood pressure (!) 138/58, pulse 67, temperature 98.4 F (36.9 C), temperature source Oral, resp. rate 20, height 5\' 5"  (1.651 m), weight 63.1 kg (139 lb 2 oz), SpO2 94 %.  I/O:   Intake/Output Summary (Last 24 hours) at 04/04/16 1838 Last data filed at 04/03/16 1857  Gross per 24 hour  Intake              260 ml  Output                0 ml  Net              260 ml    PHYSICAL EXAMINATION:  GENERAL:  65 y.o.-year-old patient lying in the bed with no acute distress.  EYES: Pupils equal, round, reactive to light and accommodation. No scleral icterus. Extraocular muscles intact.  HEENT: Head atraumatic, normocephalic. Oropharynx and nasopharynx clear.  NECK:  Supple, no jugular venous distention. No thyroid enlargement, no tenderness.  LUNGS: Normal breath sounds bilaterally, no wheezing, rales,rhonchi or crepitation. No use of accessory muscles of respiration.  CARDIOVASCULAR: S1, S2 normal. No murmurs, rubs, or gallops.  ABDOMEN: Soft, non-tender, non-distended. Bowel sounds present. No organomegaly or mass.  EXTREMITIES: No pedal edema, cyanosis, or clubbing.  NEUROLOGIC: Cranial nerves II through XII are intact. Muscle strength 5/5 in all extremities. Sensation intact. Gait not checked.  PSYCHIATRIC: The patient is alert and oriented x 3.  SKIN: No obvious rash, lesion, or ulcer.   DATA REVIEW:   CBC  Recent Labs Lab 04/02/16 0542  WBC 8.7  HGB 10.6*  HCT 31.6*  PLT 133*    Chemistries   Recent Labs Lab 03/31/16 1707   04/01/16 1636 04/04/16 0700  NA  --   < > 138  --   K  --   < > 3.5  --   CL  --   < > 110  --   CO2  --   < > 23  --   GLUCOSE  --   < > 153*  --   BUN  --   < > 13  --   CREATININE  --   < > 0.56 0.57  CALCIUM  --   < > 8.3*  --   MG 1.8  --   --   --   AST  --   --  18  --   ALT  --   --  9*  --  ALKPHOS  --   --  67  --   BILITOT  --   --  0.4  --   < > = values in this interval not displayed.  Cardiac Enzymes  Recent Labs Lab 03/31/16 0154  TROPONINI <0.03    Microbiology Results  Results for orders placed or performed during the hospital encounter of 03/30/16  MRSA PCR Screening     Status: None   Collection Time: 03/30/16  3:07 PM  Result Value Ref Range Status   MRSA by PCR NEGATIVE NEGATIVE Final    Comment:        The GeneXpert MRSA Assay (FDA approved for NASAL specimens only), is one component of a comprehensive MRSA colonization surveillance program. It is not intended to diagnose MRSA infection nor to guide or monitor treatment for MRSA infections.     RADIOLOGY:  No results found.  EKG:   Orders placed or performed during the hospital encounter of 03/30/16  . EKG 12-Lead  . EKG 12-Lead  . EKG 12-Lead  . EKG 12-Lead  . EKG 12-Lead  . EKG 12-Lead  . EKG 12-Lead  . EKG 12-Lead      Management plans discussed with the patient, family and they are in agreement.  CODE STATUS:  Code Status History    Date Active Date Inactive Code Status Order ID Comments User Context   04/04/2016 12:22 AM  Full Code [id number]  Gonzella Lex, MD Inpatient   03/30/2016  1:55 PM 04/03/2016 11:38 PM Full Code [id number]  Awilda Bill, NP ED      TOTAL TIME TAKING CARE OF THIS PATIENT: 40 minutes.    Theodoro Grist M.D on 04/04/2016 at 6:38 PM  Between 7am to 6pm - Pager - [phone]  After 6pm go to [email or web address] - password [email or web address] [facility]  [facility]  Office  [phone]  CC: Primary care physician; No PCP Per Patient
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
## 2016-04-05 LAB — HEMOGLOBIN A1C
Hgb A1c MFr Bld: 5.6 % (ref 4.8–5.6)
Mean Plasma Glucose: 114 mg/dL

## 2016-04-05 MED ORDER — LORAZEPAM 0.5 MG PO TABS
0.5000 mg | ORAL_TABLET | Freq: Two times a day (BID) | ORAL | Status: DC
  Administered 2016-04-07 – 2016-04-09 (×5): 0.5 mg via ORAL
  Filled 2016-04-05 (×6): qty 1

## 2016-04-05 MED ORDER — OLANZAPINE 5 MG PO TBDP
5.0000 mg | ORAL_TABLET | Freq: Every day | ORAL | Status: DC
Start: 2016-04-05 — End: 2016-04-06
  Filled 2016-04-05: qty 1

## 2016-04-05 NOTE — Progress Notes (Signed)
D: Observed pt in room crying. Patient alert and oriented x4. Patient denies SI/HI/AH. Pt seemed to endorse AH that "tell me to kill myself and go to [facility]." Pt affect is sad and anxious. Pt crying in room loudly difficult to console when writer arrived on unit. Pt difficult to understand do to crying, but was talking about various financial difficulties at home. Pt requested interpretor multiple times and asked for anxiety medication. When interpretor arrived, pt indicated that she was so upset earlier because another pt indicated she was not getting enough food. Pt story was difficult to follow because she would jump around when attempting to explain why she was upset.  A: Offered active listening and support. Provided therapeutic communication. Administered scheduled medications. Gave vistaril prn for anxiety. Requested interpretor. R: Pt cooperative, and crying decreased and pt calmed down shortly after giving vistaril. Pt medication compliant. Will continue Q15 min. checks. Safety maintained.
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
## 2016-04-05 NOTE — [facility] Group Notes (Signed)
[facility] Group Notes:  (Nursing/MHT/Case Management/Adjunct)  Date:  04/05/2016  Time:  4:05 PM  Type of Therapy:  Psychoeducational Skills  Participation Level:  Did Not Attend  Charise Killian 04/05/2016, 4:05 PM

## 2016-04-05 NOTE — [facility] Group Notes (Signed)
[facility] LCSW Group Therapy  04/05/2016 3:30 PM  Type of Therapy:  Group Therapy  Participation Level:  Patient did not attend group. CSW invited patient to group.   Summary of Progress/Problems: Balance in life: Patients will discuss the concept of balance and how it looks and feels to be unbalanced. Pt will identify areas in their life that is unbalanced and ways to become more balanced. They discussed what aspects in their lives has influenced their self care. Patients also discussed self care in the areas of self regulation/control, hygiene/appearance, sleep/relaxation, healthy leisure, healthy eating habits, exercise, inner peace/spirituality, self improvement, sobriety, and health management. They were challenged to identify changes that are needed in order to improve self care.   Aarin Sparkman G. [place], [place] 04/05/2016, 3:31 PM

## 2016-04-05 NOTE — Progress Notes (Signed)
Recreation Therapy Notes  Date: 11.30.17 Time: 9:30 am Location: Craft Room  Group Topic: Leisure Education  Goal Area(s) Addresses:  Patient will identify activities for each letter of the alphabet. Patient will verbalize ability to integrate positive leisure into life post d/c. Patient will verbalize ability to use leisure as a Technical sales engineer.  Behavioral Response: Did not attend   Intervention: Leisure Alphabet  Activity: Patients were given a Leisure Air traffic controller and were instructed to write healthy leisure activities for each letter of the alphabet.  Education: LRT educated patients on what they need to participate in leisure.  Education Outcome: Patient did not attend group.  Clinical Observations/Feedback: Patient did not attend group.  [name], LRT/CTRS 04/05/2016 10:19 AM

## 2016-04-05 NOTE — Plan of Care (Signed)
Problem: Coping: Goal: Ability to cope will improve Outcome: Not Progressing Demanding to go home, delusional  Problem: Medication: Goal: Compliance with prescribed medication regimen will improve Outcome: Not Progressing Refusing medications

## 2016-04-05 NOTE — Plan of Care (Signed)
Problem: Medication: Goal: Compliance with prescribed medication regimen will improve Outcome: Progressing Pt compliant with medication regimen.

## 2016-04-05 NOTE — Tx Team (Signed)
Interdisciplinary Treatment and Diagnostic Plan Update  04/05/2016 Time of Session: 10:30am Shelly Freeman MRN: [id number]  Principal Diagnosis: Severe recurrent major depression with psychotic features ([place])  Secondary Diagnoses: Principal Problem:   Severe recurrent major depression with psychotic features ([place]) Active Problems:   Intentional opiate overdose ([place])   Hypothyroidism   Lupus   GERD (gastroesophageal reflux disease)   COPD (chronic obstructive pulmonary disease) (HCC)   Current Medications:  Current Facility-Administered Medications  Medication Dose Route Frequency Provider Last Rate Last Dose  . acetaminophen (TYLENOL) tablet 650 mg  650 mg Oral Q6H PRN Gonzella Lex, MD      . alum & mag hydroxide-simeth (MAALOX/MYLANTA) 200-200-20 MG/5ML suspension 30 mL  30 mL Oral Q4H PRN Gonzella Lex, MD      . amLODipine (NORVASC) tablet 5 mg  5 mg Oral Daily Jolanta B Pucilowska, MD   5 mg at 04/04/16 1310  . DULoxetine (CYMBALTA) DR capsule 60 mg  60 mg Oral Daily Jolanta B Pucilowska, MD      . famotidine (PEPCID) tablet 20 mg  20 mg Oral BID Gonzella Lex, MD   20 mg at 04/04/16 1734  . folic acid (FOLVITE) tablet 1 mg  1 mg Oral Daily Gonzella Lex, MD   1 mg at 04/04/16 0851  . gabapentin (NEURONTIN) capsule 800 mg  800 mg Oral BID Gonzella Lex, MD   800 mg at 04/04/16 1734  . hydroxychloroquine (PLAQUENIL) tablet 400 mg  400 mg Oral Daily Gonzella Lex, MD   400 mg at 04/04/16 0850  . hydrOXYzine (ATARAX/VISTARIL) tablet 50 mg  50 mg Oral Q6H PRN Gonzella Lex, MD   50 mg at 04/04/16 2056  . ibuprofen (ADVIL,MOTRIN) tablet 800 mg  800 mg Oral QID Clovis Fredrickson, MD   800 mg at 04/04/16 2135  . ipratropium-albuterol (DUONEB) 0.5-2.5 (3) MG/3ML nebulizer solution 3 mL  3 mL Nebulization Q6H PRN Gonzella Lex, MD      . levothyroxine (SYNTHROID, LEVOTHROID) tablet 75 mcg  75 mcg Oral QAC breakfast Gonzella Lex, MD   75 mcg at 04/05/16 [id number]  . magnesium  hydroxide (MILK OF MAGNESIA) suspension 30 mL  30 mL Oral Daily PRN Gonzella Lex, MD      . QUEtiapine (SEROQUEL) tablet 50 mg  50 mg Oral QHS Clovis Fredrickson, MD   50 mg at 04/04/16 2135  . [START ON 04/08/2016] Vitamin D (Ergocalciferol) (DRISDOL) capsule 50,000 Units  50,000 Units Oral Q7 days Gonzella Lex, MD       PTA Medications: Prescriptions Prior to Admission  Medication Sig Dispense Refill Last Dose  . alendronate (FOSAMAX) 70 MG tablet Take 70 mg by mouth once a week. Take with a full glass of water on an empty stomach.     [name] amLODipine (NORVASC) 5 MG tablet Take 5 mg by mouth daily.     [name] b complex vitamins tablet Take 1 tablet by mouth daily.     . DULoxetine (CYMBALTA) 30 MG capsule Take 30 mg by mouth daily.     . folic acid (FOLVITE) 1 MG tablet Take 1 mg by mouth daily.     [name] gabapentin (NEURONTIN) 800 MG tablet Take 800 mg by mouth 2 (two) times daily.     [name] HYDROcodone-acetaminophen (NORCO/VICODIN) 5-325 MG tablet Take 1 tablet by mouth 2 (two) times daily as needed for moderate pain.     . hydroxychloroquine (PLAQUENIL) 200 MG  tablet Take 2 tablets (400 mg total) by mouth daily. 30 tablet 6   . ibuprofen (ADVIL,MOTRIN) 800 MG tablet Take 800 mg by mouth every 8 (eight) hours as needed (pain).     [name] levothyroxine (SYNTHROID, LEVOTHROID) 75 MCG tablet Take 75 mcg by mouth daily before breakfast.     . traMADol (ULTRAM) 50 MG tablet Take 1 tablet (50 mg total) by mouth every 6 (six) hours as needed for severe pain. 30 tablet 0   . traZODone (DESYREL) 50 MG tablet Take 1 tablet (50 mg total) by mouth at bedtime. (Patient taking differently: Take 150 mg by mouth at bedtime. ) 14 tablet 0   . Vitamin D, Ergocalciferol, (DRISDOL) 50000 units CAPS capsule Take 50,000 Units by mouth every 7 (seven) days.       Patient Stressors: Health problems Loss of a child.  Traumatic event  Patient Strengths: General fund of knowledge Religious Affiliation  Treatment Modalities:  Medication Management, Group therapy, Case management,  1 to 1 session with clinician, Psychoeducation, Recreational therapy.   Physician Treatment Plan for Primary Diagnosis: Severe recurrent major depression with psychotic features ([place]) Long Term Goal(s): Improvement in symptoms so as ready for discharge NA   Short Term Goals: Ability to identify changes in lifestyle to reduce recurrence of condition will improve Ability to verbalize feelings will improve Ability to disclose and discuss suicidal ideas Ability to demonstrate self-control will improve Ability to identify and develop effective coping behaviors will improve Ability to maintain clinical measurements within normal limits will improve Compliance with prescribed medications will improve NA  Medication Management: Evaluate patient's response, side effects, and tolerance of medication regimen.  Therapeutic Interventions: 1 to 1 sessions, Unit Group sessions and Medication administration.  Evaluation of Outcomes: Progressing  Physician Treatment Plan for Secondary Diagnosis: Principal Problem:   Severe recurrent major depression with psychotic features ([place]) Active Problems:   Intentional opiate overdose ([place])   Hypothyroidism   Lupus   GERD (gastroesophageal reflux disease)   COPD (chronic obstructive pulmonary disease) ([place])  Long Term Goal(s): Improvement in symptoms so as ready for discharge NA   Short Term Goals: Ability to identify changes in lifestyle to reduce recurrence of condition will improve Ability to verbalize feelings will improve Ability to disclose and discuss suicidal ideas Ability to demonstrate self-control will improve Ability to identify and develop effective coping behaviors will improve Ability to maintain clinical measurements within normal limits will improve Compliance with prescribed medications will improve NA     Medication Management: Evaluate patient's response, side effects, and  tolerance of medication regimen.  Therapeutic Interventions: 1 to 1 sessions, Unit Group sessions and Medication administration.  Evaluation of Outcomes: Progressing   RN Treatment Plan for Primary Diagnosis: Severe recurrent major depression with psychotic features ([place]) Long Term Goal(s): Knowledge of disease and therapeutic regimen to maintain health will improve  Short Term Goals: Ability to remain free from injury will improve, Ability to verbalize frustration and anger appropriately will improve, Ability to demonstrate self-control, Ability to participate in decision making will improve, Ability to verbalize feelings will improve, Ability to disclose and discuss suicidal ideas, Ability to identify and develop effective coping behaviors will improve and Compliance with prescribed medications will improve  Medication Management: RN will administer medications as ordered by provider, will assess and evaluate patient's response and provide education to patient for prescribed medication. RN will report any adverse and/or side effects to prescribing provider.  Therapeutic Interventions: 1 on 1 counseling sessions, Psychoeducation,  Medication administration, Evaluate responses to treatment, Monitor vital signs and CBGs as ordered, Perform/monitor CIWA, COWS, AIMS and Fall Risk screenings as ordered, Perform wound care treatments as ordered.  Evaluation of Outcomes: Progressing   LCSW Treatment Plan for Primary Diagnosis: Severe recurrent major depression with psychotic features ([place]) Long Term Goal(s): Safe transition to appropriate next level of care at discharge, Engage patient in therapeutic group addressing interpersonal concerns.  Short Term Goals: Engage patient in aftercare planning with referrals and resources, Increase social support, Increase ability to appropriately verbalize feelings, Increase emotional regulation, Facilitate acceptance of mental health diagnosis and concerns and  Increase skills for wellness and recovery  Therapeutic Interventions: Assess for all discharge needs, 1 to 1 time with Social worker, Explore available resources and support systems, Assess for adequacy in community support network, Educate family and significant other(s) on suicide prevention, Complete Psychosocial Assessment, Interpersonal group therapy.  Evaluation of Outcomes: Progressing   Progress in Treatment: Attending groups: Yes. Participating in groups: Yes. Taking medication as prescribed: Yes. Toleration medication: Yes. Family/Significant other contact made: No, will contact:  pt's grandaughter Patient understands diagnosis: Yes. Discussing patient identified problems/goals with staff: No. Pt listed none Medical problems stabilized or resolved: Yes. Denies suicidal/homicidal ideation: Yes. Issues/concerns per patient self-inventory: No Other: Pt was upset ablout a man named Jose who the pt says is an interpreter  New problem(s) identified: No, Describe:  none listed, interpreter requested  New Short Term/Long Term Goal(s):  Discharge Plan or Barriers: Pt Pt will discharge home to [facility] to live with the pt's granddaughter and will follow up with [facility] for for medication management, substance abuse treatment and therapy.    Reason for Continuation of Hospitalization: Anxiety Depression Hallucinations Suicidal ideation  Estimated Length of Stay: 3-5 days  Attendees: Patient: Shelly Freeman 04/05/2016 10:49 AM  Physician: Dr. Bary Leriche, MD 04/05/2016 10:49 AM  Nursing: Elige Radon, RN 04/05/2016 10:49 AM  RN Care Manager: 04/05/2016 10:49 AM  Social Worker: Alphonse Guild. Daine Gravel, LCAS   04/05/2016 10:49 AM  Recreational Therapist: Everitt Amber, [place] 04/05/2016 10:49 AM  Other:  04/05/2016 10:49 AM  Other:  04/05/2016 10:49 AM  Other: 04/05/2016 10:49 AM    Scribe for Treatment Team: Claudine Mouton,  Latanya Presser   [id number]  [id number]

## 2016-04-05 NOTE — Plan of Care (Signed)
Problem: Coping: Goal: Ability to demonstrate self-control will improve Outcome: Not Progressing Crying spells, labile

## 2016-04-06 MED ORDER — OLANZAPINE 10 MG IM SOLR
5.0000 mg | Freq: Two times a day (BID) | INTRAMUSCULAR | Status: DC
  Filled 2016-04-06: qty 10

## 2016-04-06 MED ORDER — LORAZEPAM 2 MG PO TABS
2.0000 mg | ORAL_TABLET | ORAL | Status: DC | PRN
  Administered 2016-04-07: 2 mg via ORAL

## 2016-04-06 MED ORDER — ENSURE ENLIVE PO LIQD
237.0000 mL | Freq: Three times a day (TID) | ORAL | Status: DC
  Administered 2016-04-06 – 2016-04-16 (×21): 237 mL via ORAL

## 2016-04-06 MED ORDER — LORAZEPAM 2 MG PO TABS
ORAL_TABLET | ORAL | Status: AC
  Administered 2016-04-07: 2 mg via ORAL
  Filled 2016-04-06: qty 1

## 2016-04-06 MED ORDER — TEMAZEPAM 15 MG PO CAPS
30.0000 mg | ORAL_CAPSULE | Freq: Every day | ORAL | Status: DC
  Administered 2016-04-07 – 2016-04-15 (×9): 30 mg via ORAL
  Filled 2016-04-06 (×9): qty 2

## 2016-04-06 MED ORDER — OLANZAPINE 5 MG PO TBDP
5.0000 mg | ORAL_TABLET | Freq: Two times a day (BID) | ORAL | Status: DC
  Administered 2016-04-06 – 2016-04-10 (×9): 5 mg via ORAL
  Filled 2016-04-06 (×9): qty 1

## 2016-04-06 MED ORDER — OLANZAPINE 5 MG PO TBDP
5.0000 mg | ORAL_TABLET | ORAL | Status: AC
  Filled 2016-04-06 (×2): qty 1

## 2016-04-06 NOTE — Plan of Care (Signed)
Problem: Coping: Goal: Ability to demonstrate self-control will improve Outcome: Not Progressing Pt crying, inconsolable. Pt needs constant redirection to maintain calm on the unit.

## 2016-04-06 NOTE — B[facility] Group Notes (Signed)
[facility] Group Notes:  (Nursing/MHT/Case Management/Adjunct)  Date:  04/06/2016  Time:  4:59 PM  Type of Therapy:  Psychoeducational Skills  Participation Level:  Did Not Attend  Participation Quality: Coralie Common 04/06/2016, 4:59 PM

## 2016-04-06 NOTE — Progress Notes (Signed)
Patient has been pacing halls all day with belongings in hand. Has been in room some today crying but did not disclose why. Has been verbally aggressive towards staff. Has been delusional and very paranoid. Has been targeting staff and fussing in [place]. Has been going on other halls, difficult to redirect. Has not eaten any meals today and has only had a couple sips of water. Is now IVC and on forced medications if she does not take oral medication. Refused all other medication. . Did end up taking po Zyprexa after a show of support from staff and security. After getting clothes from daughter this morning became very tearful while sitting on the bed, tired to get her to tell why she was upset, but she began talking in [place]. Did talk to daughter today and she is going to try and come visit and talk to her mom.  Remains on Q15 for safety. Will continue to monitor.

## 2016-04-06 NOTE — Progress Notes (Signed)
Patient is not a big eater, but after talking to daughter, was given a list of foods she does like. She likes bread/rolls, rice, chicken, passion fruit juice, coffee, maria cookies, popcorn, and chicken noodle soup.

## 2016-04-06 NOTE — Progress Notes (Signed)
[facility] Second Physician Opinion Progress Note for Medication Administration to Non-consenting Patients (For Involuntarily Committed Patients)  Patient: Shelly Freeman Date of Birth: [phone] MRN: [id number]  Reason for the Medication: The patient, without the benefit of the specific treatment measure, is incapable of participating in any available treatment plan that will give the patient a realistic opportunity of improving the patient's condition. There is, without the benefit of the specific treatment measure, a significant possibility that the patient will harm self or others before improvement of the patient's condition is realized.  Consideration of Side Effects: Consideration of the side effects related to the medication plan has been given.  Rationale for Medication Administration: The patient is floridly psychotic unable to make best decisions.    Orson Slick, MD 04/06/16  8:54 AM   This documentation is good for (7) seven days from the date of the MD signature. New documentation must be completed every seven (7) days with detailed justification in the medical record if the patient requires continued non-emergent administration of psychotropic medications.

## 2016-04-06 NOTE — Tx Team (Signed)
Interdisciplinary Treatment and Diagnostic Plan Update  04/06/2016 Time of Session: 10:30am Shelly Freeman MRN: [id number]  Principal Diagnosis: Severe recurrent major depression with psychotic features ([name])  Secondary Diagnoses: Principal Problem:   Severe recurrent major depression with psychotic features ([name]) Active Problems:   Intentional opiate overdose ([name])   Hypothyroidism   Lupus   GERD (gastroesophageal reflux disease)   COPD (chronic obstructive pulmonary disease) (HCC)   Current Medications:  Current Facility-Administered Medications  Medication Dose Route Frequency Provider Last Rate Last Dose  . acetaminophen (TYLENOL) tablet 650 mg  650 mg Oral Q6H PRN Gonzella Lex, MD      . alum & mag hydroxide-simeth (MAALOX/MYLANTA) 200-200-20 MG/5ML suspension 30 mL  30 mL Oral Q4H PRN Gonzella Lex, MD      . amLODipine (NORVASC) tablet 5 mg  5 mg Oral Daily Jolanta B Pucilowska, MD   5 mg at 04/04/16 1310  . DULoxetine (CYMBALTA) DR capsule 60 mg  60 mg Oral Daily Jolanta B Pucilowska, MD      . famotidine (PEPCID) tablet 20 mg  20 mg Oral BID Gonzella Lex, MD   20 mg at 04/04/16 1734  . feeding supplement (ENSURE ENLIVE) (ENSURE ENLIVE) liquid 237 mL  237 mL Oral TID BM [name], MD      . gabapentin (NEURONTIN) capsule 800 mg  800 mg Oral BID Gonzella Lex, MD   800 mg at 04/04/16 1734  . hydroxychloroquine (PLAQUENIL) tablet 400 mg  400 mg Oral Daily Gonzella Lex, MD   400 mg at 04/04/16 0850  . ibuprofen (ADVIL,MOTRIN) tablet 800 mg  800 mg Oral QID Clovis Fredrickson, MD   800 mg at 04/04/16 2135  . ipratropium-albuterol (DUONEB) 0.5-2.5 (3) MG/3ML nebulizer solution 3 mL  3 mL Nebulization Q6H PRN Gonzella Lex, MD      . levothyroxine (SYNTHROID, LEVOTHROID) tablet 75 mcg  75 mcg Oral QAC breakfast Gonzella Lex, MD   75 mcg at 04/05/16 [id number]  . LORazepam (ATIVAN) tablet 0.5 mg  0.5 mg Oral BID [name], MD      . magnesium  hydroxide (MILK OF MAGNESIA) suspension 30 mL  30 mL Oral Daily PRN Gonzella Lex, MD      . OLANZapine (ZYPREXA) injection 5 mg  5 mg Intramuscular BID [name], MD       Or  . OLANZapine zydis (ZYPREXA) disintegrating tablet 5 mg  5 mg Oral BID [name], MD   5 mg at 04/06/16 1025  . OLANZapine zydis (ZYPREXA) disintegrating tablet 5 mg  5 mg Oral NOW [name], MD      . Derrill Memo ON 04/08/2016] Vitamin D (Ergocalciferol) (DRISDOL) capsule 50,000 Units  50,000 Units Oral Q7 days Gonzella Lex, MD       PTA Medications: Prescriptions Prior to Admission  Medication Sig Dispense Refill Last Dose  . alendronate (FOSAMAX) 70 MG tablet Take 70 mg by mouth once a week. Take with a full glass of water on an empty stomach.     [name] amLODipine (NORVASC) 5 MG tablet Take 5 mg by mouth daily.     [name] b complex vitamins tablet Take 1 tablet by mouth daily.     . DULoxetine (CYMBALTA) 30 MG capsule Take 30 mg by mouth daily.     . folic acid (FOLVITE) 1 MG tablet Take 1 mg by mouth daily.     [name] gabapentin (NEURONTIN) 800 MG tablet Take 800 mg  by mouth 2 (two) times daily.     [name] HYDROcodone-acetaminophen (NORCO/VICODIN) 5-325 MG tablet Take 1 tablet by mouth 2 (two) times daily as needed for moderate pain.     . hydroxychloroquine (PLAQUENIL) 200 MG tablet Take 2 tablets (400 mg total) by mouth daily. 30 tablet 6   . ibuprofen (ADVIL,MOTRIN) 800 MG tablet Take 800 mg by mouth every 8 (eight) hours as needed (pain).     [name] levothyroxine (SYNTHROID, LEVOTHROID) 75 MCG tablet Take 75 mcg by mouth daily before breakfast.     . traMADol (ULTRAM) 50 MG tablet Take 1 tablet (50 mg total) by mouth every 6 (six) hours as needed for severe pain. 30 tablet 0   . traZODone (DESYREL) 50 MG tablet Take 1 tablet (50 mg total) by mouth at bedtime. (Patient taking differently: Take 150 mg by mouth at bedtime. ) 14 tablet 0   . Vitamin D, Ergocalciferol, (DRISDOL) 50000 units CAPS  capsule Take 50,000 Units by mouth every 7 (seven) days.       Patient Stressors: Health problems Loss of a child.  Traumatic event  Patient Strengths: General fund of knowledge Religious Affiliation  Treatment Modalities: Medication Management, Group therapy, Case management,  1 to 1 session with clinician, Psychoeducation, Recreational therapy.   Physician Treatment Plan for Primary Diagnosis: Severe recurrent major depression with psychotic features ([place]) Long Term Goal(s): Improvement in symptoms so as ready for discharge NA   Short Term Goals: Ability to identify changes in lifestyle to reduce recurrence of condition will improve Ability to verbalize feelings will improve Ability to disclose and discuss suicidal ideas Ability to demonstrate self-control will improve Ability to identify and develop effective coping behaviors will improve Ability to maintain clinical measurements within normal limits will improve Compliance with prescribed medications will improve NA  Medication Management: Evaluate patient's response, side effects, and tolerance of medication regimen.  Therapeutic Interventions: 1 to 1 sessions, Unit Group sessions and Medication administration.  Evaluation of Outcomes: Progressing  Physician Treatment Plan for Secondary Diagnosis: Principal Problem:   Severe recurrent major depression with psychotic features ([name]) Active Problems:   Intentional opiate overdose ([name])   Hypothyroidism   Lupus   GERD (gastroesophageal reflux disease)   COPD (chronic obstructive pulmonary disease) ([name])  Long Term Goal(s): Improvement in symptoms so as ready for discharge NA   Short Term Goals: Ability to identify changes in lifestyle to reduce recurrence of condition will improve Ability to verbalize feelings will improve Ability to disclose and discuss suicidal ideas Ability to demonstrate self-control will improve Ability to identify and develop effective coping  behaviors will improve Ability to maintain clinical measurements within normal limits will improve Compliance with prescribed medications will improve NA     Medication Management: Evaluate patient's response, side effects, and tolerance of medication regimen.  Therapeutic Interventions: 1 to 1 sessions, Unit Group sessions and Medication administration.  Evaluation of Outcomes: Progressing   RN Treatment Plan for Primary Diagnosis: Severe recurrent major depression with psychotic features ([place]) Long Term Goal(s): Knowledge of disease and therapeutic regimen to maintain health will improve  Short Term Goals: Ability to remain free from injury will improve, Ability to verbalize frustration and anger appropriately will improve, Ability to demonstrate self-control, Ability to participate in decision making will improve, Ability to verbalize feelings will improve, Ability to disclose and discuss suicidal ideas, Ability to identify and develop effective coping behaviors will improve and Compliance with prescribed medications will improve  Medication Management: RN will administer  medications as ordered by provider, will assess and evaluate patient's response and provide education to patient for prescribed medication. RN will report any adverse and/or side effects to prescribing provider.  Therapeutic Interventions: 1 on 1 counseling sessions, Psychoeducation, Medication administration, Evaluate responses to treatment, Monitor vital signs and CBGs as ordered, Perform/monitor CIWA, COWS, AIMS and Fall Risk screenings as ordered, Perform wound care treatments as ordered.  Evaluation of Outcomes: Progressing   LCSW Treatment Plan for Primary Diagnosis: Severe recurrent major depression with psychotic features ([place]) Long Term Goal(s): Safe transition to appropriate next level of care at discharge, Engage patient in therapeutic group addressing interpersonal concerns.  Short Term Goals: Engage  patient in aftercare planning with referrals and resources, Increase social support, Increase ability to appropriately verbalize feelings, Increase emotional regulation, Facilitate acceptance of mental health diagnosis and concerns and Increase skills for wellness and recovery  Therapeutic Interventions: Assess for all discharge needs, 1 to 1 time with Social worker, Explore available resources and support systems, Assess for adequacy in community support network, Educate family and significant other(s) on suicide prevention, Complete Psychosocial Assessment, Interpersonal group therapy.  Evaluation of Outcomes: Progressing   Progress in Treatment: Attending groups: Yes. Participating in groups: Yes. Taking medication as prescribed: Yes. Toleration medication: Yes. Family/Significant other contact made: No, will contact:  pt's grandaughter Patient understands diagnosis: Yes. Discussing patient identified problems/goals with staff: No. Pt listed none Medical problems stabilized or resolved: Yes. Denies suicidal/homicidal ideation: Yes. Issues/concerns per patient self-inventory: No Other: Pt was upset ablout a man named Jose who the pt says is an interpreter  New problem(s) identified: No, Describe:  none listed, interpreter requested  New Short Term/Long Term Goal(s):  Discharge Plan or Barriers: Pt Pt will discharge home to [facility] to live with the pt's granddaughter and will follow up with [facility] for for medication management, substance abuse treatment and therapy.    Reason for Continuation of Hospitalization: Anxiety Depression Hallucinations Suicidal ideation  Estimated Length of Stay: 3-5 days  Attendees: Patient: Pt was too acute to attend 04/06/2016 10:49 AM  Physician: Dr. Jerilee Hoh, MD 04/06/2016 10:49 AM  Nursing: Elige Radon, RN 04/06/2016 10:49 AM  RN Care Manager: 04/06/2016 10:49 AM  Social Worker: Alphonse Guild. Daine Gravel, LCAS    04/06/2016 10:49 AM  Recreational Therapist: Everitt Amber, LRT 04/06/2016 10:49 AM  Other:  04/06/2016 10:49 AM  Other:  04/06/2016 10:49 AM  Other: 04/06/2016 10:49 AM    Scribe for Treatment Team: Claudine Mouton, Latanya Presser   [id number]  [id number]

## 2016-04-06 NOTE — BHH Group Notes (Signed)
[facility] Group Notes:  (Nursing/MHT/Case Management/Adjunct)  Date:  04/06/2016  Time:  3:56 AM  Type of Therapy:  Psychoeducational Skills  Participation Level:  Did Not Attend  Summary of Progress/Problems:  Reece Agar 04/06/2016, 3:56 AM
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
## 2016-04-06 NOTE — Progress Notes (Signed)
Recreation Therapy Notes  Date: 12.01.17 Time: 9:30 am Location: Craft Room  Group Topic: Coping Skills  Goal Area(s) Addresses:  Patient will participate in healthy coping skill. Patient will verbalize benefit of using art as a coping skill.  Behavioral Response: Did not attend  Intervention: Coloring  Activity: Patients were given coloring sheets and were instructed to think about the emotions they were feeling as well as what their minds were focused on.  Education: LRT educated patients on healthy coping skills.  Education Outcome: Patient did not attend group.  Clinical Observations/Feedback: Patient did not attend group.  [name], LRT/CTRS 04/06/2016 10:23 AM

## 2016-04-06 NOTE — Plan of Care (Signed)
Problem: Fluid Volume: Goal: Ability to maintain a balanced intake and output will improve Outcome: Not Progressing Patient has not eaten any meals today and only had sips of water

## 2016-04-06 NOTE — Progress Notes (Signed)
D: Pt continues to cry, demand loudly to "go home...go to the emergency room...talk to [facility]." Pt is now complaining of "kidney pain" and clutches chest complaining of chest pain.  A: Attempted to get vital signs. Called Dr. Jerilee Hoh to apprise her of the situation. EKG ordered. 5mg  dose of Zyprexa ordered. Continued to attempt to orient and redirect pt.  R:Pt refused vital signs. Pt refused EKG. Pt refused medication. Pt refused water that was offered. Pt walks around nursing station crying, refusing everything and holding hand to chest.

## 2016-04-06 NOTE — Progress Notes (Addendum)
Collateral information was obtained from her granddaughter phone number [phone]. I spoke with her granddaughter on 11/30. Granddaughter detected some delusional thinking prior to coming into the hospital. Says that her grandmother has been talking about things that happened around the time when her son committed suicide. She believes Jos Edsel Petrin might be somebody that she met while she was hospitalized psychiatrically in [place]. No prior history of psychosis but the granddaughter.

## 2016-04-06 NOTE — Progress Notes (Signed)
Patient did not attend group therapy  Calen Geister LCSW [phone]

## 2016-04-06 NOTE — Progress Notes (Signed)
D: Observed pt walking up to the nursing station. Patient alert and oriented x4. Patient denies SI/HI/AVH. Pt affect is angry, irritable and anxious. Pt demanding to leave stating "I need to go home now!" Pt very paranoid and delusional. Pt demanding to talk to "jose" who pt claims is the hospital director. Pt stated her ride is waiting for her outside. Pt also indicated that her brother is flying in at [place] constantly coming up to nursing station agitated saying she has to leave.  A: Offered active listening and support. Provided therapeutic communication. Offered and strongly encouraged medications. Called doctor to inform of situation. Redirected pt constantly throughout the night. R: Pt vehemently refused medication. Pt did not sleep and would only sit in bed and then come up to nursing station demanding to "go home." Pt can not be reasoned with. Pt needs constant redirection to maintain control on the unit. Will continue Q15 min. checks. Safety maintained.

## 2016-04-07 LAB — COMPREHENSIVE METABOLIC PANEL
ALT: 11 U/L — ABNORMAL LOW (ref 14–54)
AST: 19 U/L (ref 15–41)
Albumin: 3.6 g/dL (ref 3.5–5.0)
Alkaline Phosphatase: 64 U/L (ref 38–126)
Anion gap: 9 (ref 5–15)
BUN: 18 mg/dL (ref 6–20)
CO2: 27 mmol/L (ref 22–32)
Calcium: 9 mg/dL (ref 8.9–10.3)
Chloride: 106 mmol/L (ref 101–111)
Creatinine, Ser: 0.56 mg/dL (ref 0.44–1.00)
GFR calc Af Amer: >60 mL/min (ref >60)
GFR calc non Af Amer: >60 mL/min (ref >60)
Glucose, Bld: 87 mg/dL (ref 65–99)
Potassium: 3.8 mmol/L (ref 3.5–5.1)
Sodium: 142 mmol/L (ref 135–145)
Total Bilirubin: 0.8 mg/dL (ref 0.3–1.2)
Total Protein: 6.7 g/dL (ref 6.5–8.1)

## 2016-04-07 LAB — CBC
HCT: 36.4 % (ref 35.0–47.0)
Hemoglobin: 12.3 g/dL (ref 12.0–16.0)
MCH: 29.4 pg (ref 26.0–34.0)
MCHC: 33.7 g/dL (ref 32.0–36.0)
MCV: 87.2 fL (ref 80.0–100.0)
Platelets: 200 10*3/uL (ref 150–440)
RBC: 4.17 MIL/uL (ref 3.80–5.20)
RDW: 15.7 % — ABNORMAL HIGH (ref 11.5–14.5)
WBC: 5.7 10*3/uL (ref 3.6–11.0)

## 2016-04-07 NOTE — Progress Notes (Signed)
This patient did not attend LCSW Therapy Group.  Toribio Seiber LCSW

## 2016-04-07 NOTE — Plan of Care (Signed)
Problem: Medication: Goal: Compliance with prescribed medication regimen will improve Outcome: Progressing Was able to convince pt to take some medications this evening.

## 2016-04-07 NOTE — Plan of Care (Signed)
Problem: Nutrition: Goal: Adequate nutrition will be maintained Outcome: Not Progressing Patient not eating. Refusing nutrition supplements.MD notified

## 2016-04-07 NOTE — Progress Notes (Signed)
D: Observed pt standing out in the hall by the nursing station. Patient alert and oriented to person and time. Patient denies SI/HI/AVH. Pt affect is anxious and preoccupied. Pt still very preoccupied with talking with "jose" who she believes is the director of the hospital. Pt remains delusional and paranoid, accusing other patients of making threats against her. Pt much less agitated and argumentative this evening. Pt still persistent and needy, but much more redirectable tonight. Pt smiling and laughing this evening, and not crying all night. A: Offered active listening and support. Provided therapeutic communication. Attempted to administer scheduled medications.  R: Pt cooperative. Pt took Ibuprofen and Ativan this evening. Later in the evening, pt awoke and was a little clearer in her thinking. Pt did endorse that she overdosed on 12 hydrocodone because "problems at home."  Will continue Q15 min. checks. Safety maintained.

## 2016-04-07 NOTE — [facility] Group Notes (Signed)
[facility] Group Notes:  (Nursing/MHT/Case Management/Adjunct)  Date:  04/07/2016  Time:  12:32 AM  Type of Therapy:  Psychoeducational Skills  Participation Level:  Did Not Attend  Summary of Progress/Problems:  Reece Agar 04/07/2016, 12:32 AM
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
## 2016-04-07 NOTE — Plan of Care (Signed)
Problem: Fluid Volume: Goal: Ability to maintain a balanced intake and output will improve Outcome: Not Progressing Poor fluid intake. MD aware

## 2016-04-07 NOTE — Progress Notes (Signed)
Patient stayed in bed most of the shift. Was seen by MD and labs ordered. Patient got out of bed per staff' encouragements. Walked around and was able to eat supper in the dayroom. Alert and oriented, pleasant and cooperative. Emotional support offered and therapeutic milieu promoted. Safety and security maintained.

## 2016-04-07 NOTE — Progress Notes (Signed)
Patient has been in bed resting.  Alert and oriented but looks tired and restless. Refused breakfast reporting that food makes her stomach upset. Medications were taken to her in room. Refused ensure, reporting that her stomach rejects it. MD notified. Safety precautions reinforced. RN will continue to assess.

## 2016-04-08 NOTE — Progress Notes (Signed)
Pt is alert and oriented to person and place, respirations even and unlabored, gait steady, with assistance, no acute distress noted. Pt c/o feeling weak and lethargic, and pain in legs bilaterally at the beginning of shift. VS checked, and they're stable. Administered Ibuprofen 800 mg as ordered. Pt expressed relief. Asked this writer to hold her arm while walking to and from the medication room. Pt reported "I use a walker at home, but not all of the time." Pt was asked if she would use a walker here if she had one and she replied "no. I'm just feeling tired, that's all." Will pass info to oncoming shift, and ask to have doctor evaluate her. Gave pt Ensure supplement drink and observed pt drink it all. Pt is isolative to room, only coming out for medications. Instructed to use call bell if needs assistance with ambulation to and from bathroom. Denies SI/HI/AVH but does report feeling anxious, depressed, and helplessness. No further complaints. Is medication compliant. Remains on q15 minute checks for safety. Will continue to monitor.

## 2016-04-08 NOTE — [facility] Group Notes (Signed)
[facility] Group Notes:  (Nursing/MHT/Case Management/Adjunct)  Date:  04/08/2016  Time:  6:56 AM  Type of Therapy:  Psychoeducational Skills  Participation Level:  Did Not Attend  Participation Quality: Summary of Progress/Problems:  Nehemiah Settle 04/08/2016, 6:56 AM

## 2016-04-08 NOTE — Progress Notes (Signed)
Patient did not attend  therapeutic group  Shelly Bommarito [place] LCSW [phone]

## 2016-04-08 NOTE — Plan of Care (Signed)
Problem: Safety: Goal: Ability to remain free from injury will improve Outcome: Progressing Aware of fall risks and taking precautions
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
## 2016-04-08 NOTE — Plan of Care (Signed)
Problem: Safety: Goal: Periods of time without injury will increase Outcome: Progressing Pt has remained free from injury.  Problem: Education: Goal: Ability to make informed decisions regarding treatment will improve Outcome: Not Progressing Pt is unable to make informed decisions regarding treatment. Is confused at times.  Problem: Medication: Goal: Compliance with prescribed medication regimen will improve Outcome: Progressing Pt is compliant with medication regimen. Took all HS medications to night.  Problem: Self-Concept: Goal: Ability to disclose and discuss suicidal ideas will improve Outcome: Progressing Pt verbalizes feelings and SI to staff, if and when she experiences them.

## 2016-04-08 NOTE — Plan of Care (Addendum)
Problem: Safety: Goal: Ability to remain free from injury will improve Outcome: Progressing Pt has remained free from injury.  Problem: Pain Managment: Goal: General experience of comfort will improve Outcome: Progressing Pt has not c/o pain or discomfort this shift.  Problem: Coping: Goal: Ability to verbalize frustrations and anger appropriately will improve Outcome: Progressing Pt is able to, and does verbalize feelings of frustration, anger, anxiety, sadness, depression and anxiety, if applicable.  Problem: Education: Goal: Ability to make informed decisions regarding treatment will improve Outcome: Progressing Pt is medication compliant.

## 2016-04-08 NOTE — Progress Notes (Signed)
Patient stayed in bed but was out for breakfast. Alert and orient  With generalized weakness. Denies suicidal/homicidal thoughts. Currently no sign of hallucinations. Was comfortable with another patient who speaks her language and supportive. Patient ate breakfast and received her medications voluntarily. Emotional support and encouragements offered. Safety precautions maintained.

## 2016-04-08 NOTE — Progress Notes (Signed)
Patient was seen in the milieu but was in bed most of the shift. Improved in mobility and presenting to the dayroom for meals. Had her medications as scheduled and appetite improving. Emotional support and encouragements provided. Was encouraged to call staff for assistance with mobility. Therapeutic milieu promoted. Safety precautions reinforced.

## 2016-04-08 NOTE — Evaluation (Signed)
Physical Therapy Evaluation Patient Details Name: Shelly Freeman MRN: [id number] DOB: 20-Mar-1951 Today's Date: 04/08/2016   History of Present Illness  Patient admitted for overdose on narcotic pain medications. She was evaluated for knee pain which she reports she has had for 10 years, imaging negative. She did require intubation during previous admission.   Clinical Impression  Patient recently seen by therapist at this hospital, appears to be at roughly the same level as seen in previous session. She has a history of chronic knee pain, which she states limits her step lengths. She reports she has had this knee pain bilaterally x 10 years, has been recommended to use RW, but uses a cane. She denies falls at home. She does demonstrate significant LE weakness as evidenced by heavy use of UEs during sit to stand transfer, short step lengths. Would recommend use of RW and HHPT for balance activities, and assessment of knee pain.     Follow Up Recommendations Home health PT    Equipment Recommendations  Rolling walker with 5" wheels    Recommendations for Other Services       Precautions / Restrictions Precautions Precautions: Fall Restrictions Weight Bearing Restrictions: No      Mobility  Bed Mobility Overal bed mobility: Independent             General bed mobility comments: No deficits aside from slow transfer.   Transfers Overall transfer level: Needs assistance Equipment used: None Transfers: Sit to/from Stand Sit to Stand: Supervision         General transfer comment: Patient with very slow transfer, use of UEs but no obvious loss of balance.   Ambulation/Gait Ambulation/Gait assistance: Min guard Ambulation Distance (Feet): 120 Feet Assistive device: Rolling walker (2 wheeled) Gait Pattern/deviations: Step-through pattern;Decreased step length - right;Decreased step length - left;Trunk flexed   Gait velocity interpretation: <1.8 ft/sec, indicative of risk  for recurrent falls General Gait Details: Patient demonstrates very short step length bilaterally due to "knee pain" reports she has had this for ~ 10 years. Reports she was told to use a RW but doesn't. Provided RW in this session and educated on the use. No increase in step length with RW.   Stairs            Wheelchair Mobility    Modified Rankin (Stroke Patients Only)       Balance Overall balance assessment: Needs assistance Sitting-balance support: No upper extremity supported Sitting balance-Leahy Scale: Good     Standing balance support: Bilateral upper extremity supported Standing balance-Leahy Scale: Fair                               Pertinent Vitals/Pain Pain Assessment: Faces Faces Pain Scale: Hurts little more Pain Location: Bilateral knees, posterior/anterior  Pain Descriptors / Indicators: Aching Pain Intervention(s): Limited activity within patient's tolerance    Home Living Family/patient expects to be discharged to:: Private residence Living Arrangements: Children Available Help at Discharge: Family Type of Home: House Home Access: Stairs to enter Entrance Stairs-Rails: Left Entrance Stairs-Number of Steps: "flight" per patient    Home Equipment: Cane - single point Additional Comments: Pt poor historian, unsure of home environment    Prior Function Level of Independence: Independent with assistive device(s)         Comments: uses SPC when she experiences knee pain, otherwise ambulates independently     Hand Dominance        Extremity/Trunk  Assessment   Upper Extremity Assessment: Overall WFL for tasks assessed           Lower Extremity Assessment: Generalized weakness         Communication   Communication: Prefers language other than English (Spanish is preferred language.)  Cognition Arousal/Alertness: Awake/alert Behavior During Therapy: Flat affect Overall Cognitive Status: Within Functional Limits for  tasks assessed                      General Comments      Exercises     Assessment/Plan    PT Assessment Patient needs continued PT services  PT Problem List Decreased strength;Decreased balance;Decreased mobility;Decreased activity tolerance;Decreased safety awareness;Decreased knowledge of use of DME;Decreased cognition          PT Treatment Interventions DME instruction;Therapeutic activities;Gait training;Stair training;Therapeutic exercise;Balance training    PT Goals (Current goals can be found in the Care Plan section)  Acute Rehab PT Goals Patient Stated Goal: To return home  PT Goal Formulation: With patient Time For Goal Achievement: 04/22/16 Potential to Achieve Goals: Fair    Frequency Min 2X/week   Barriers to discharge        Co-evaluation               End of Session Equipment Utilized During Treatment: Gait belt Activity Tolerance: Patient tolerated treatment well Patient left: in bed Nurse Communication: Mobility status         Time: [id number] PT Time Calculation (min) (ACUTE ONLY): 12 min   Charges:   PT Evaluation $PT Eval Low Complexity: 1 Procedure     PT G Codes:       Kerman Passey, PT, DPT    04/08/2016, 11:53 AM

## 2016-04-08 NOTE — Plan of Care (Signed)
Problem: Coping: Goal: Ability to cope will improve Outcome: Progressing Communicating her needs and concerns

## 2016-04-09 NOTE — Progress Notes (Signed)
Patient pleasant and cooperative. Medication compliant. Assisted patient with ambulation d/t unsteady gait. PT in to see patient. Interpreter used. Denies SI, HI, AVH. Encouragement and support offered. Medications given as prescribed. Pt remains safe on unit with q 15 min checks.

## 2016-04-09 NOTE — Progress Notes (Signed)
Pt is alert and oriented to person and place, gait unsteady, speech clear, with no acute distress noted. Has an order for walker. PT aware. Denies SI/HI/AVH, and depression, but does report feeling anxious at a 3/10, related to being here at [facility]. No further complaints. Pt requested to have an adult brief on at night "in case I miss a little to the bathroom." Request was granted, brief applied to pt with no issues. Pt was also instructed to use the call bell if assistance was needed with ambulation, or anything else. Pt agreed to do so. Is medication compliant. Remains on q15 minute observation checks for safety. Will continue to monitor.

## 2016-04-09 NOTE — [facility] Group Notes (Signed)
[facility] LCSW Group Therapy   04/09/2016 1pm Type of Therapy: Group Therapy   Participation Level: Pt invited but did not attend.  Participation Quality: Pt invited but did not attend.  Glorious Peach, MSW, LCSWA 04/09/2016, 2:21PM

## 2016-04-09 NOTE — Progress Notes (Signed)
Physical Therapy Treatment Patient Details Name: Shelly Freeman MRN: [id number] DOB: January 11, 1951 Today's Date: 04/09/2016    History of Present Illness Patient admitted for overdose on narcotic pain medications. She was evaluated for knee pain which she reports she has had for 10 years, imaging negative. She did require intubation during previous admission.     PT Comments    Spanish interpreter used during session.  Pt ind with bed mobility but slow with extra time and effort required.  Pt able to amb 3 x 20', 1 x 150' with RW and SBA with very slow cadence and short B step length.  Pt required SBA with transfers with mod verbal cueing for proper sequencing with limited carry over.  Pt required 51 sec to complete 5 time sit to stand with BUE assist placing pt at elevated risk of falls.  Pt will benefit from continued skilled PT services to address deficits in strength, gait, and activity tolerance for decreased risk of falls and decreased caregiver assistance upon discharge.     Follow Up Recommendations  Home health PT     Equipment Recommendations  Rolling walker with 5" wheels    Recommendations for Other Services       Precautions / Restrictions Precautions Precautions: Fall Restrictions Weight Bearing Restrictions: No    Mobility  Bed Mobility Overal bed mobility: Independent                Transfers Overall transfer level: Needs assistance Equipment used: Rolling walker (2 wheeled) Transfers: Sit to/from Stand Sit to Stand: Supervision         General transfer comment: Slow gaurded transfers with mod verbal cues for sequencing/hand placement  Ambulation/Gait   Ambulation Distance (Feet): 150 Feet Assistive device: Rolling walker (2 wheeled) Gait Pattern/deviations: Step-through pattern;Decreased step length - left;Decreased step length - right   Gait velocity interpretation: Below normal speed for age/gender General Gait Details: Slow cadence with  gait with short B step length.   Stairs            Wheelchair Mobility    Modified Rankin (Stroke Patients Only)       Balance Overall balance assessment: Needs assistance Sitting-balance support: No upper extremity supported Sitting balance-Leahy Scale: Good     Standing balance support: Bilateral upper extremity supported Standing balance-Leahy Scale: Fair                      Cognition Arousal/Alertness: Awake/alert Behavior During Therapy: Flat affect Overall Cognitive Status: Within Functional Limits for tasks assessed                      Exercises Total Joint Exercises Hip ABduction/ADduction: AROM;Both;10 reps (Standing) Standing Hip Extension: AROM;Both;10 reps Other Exercises Other Exercises: Standing B heel raises x 10, mini squats x 10; 5 time sit to stand in 51 sec with BUE assist; frequent therapeutic rest breaks during session secondary to B knee pain.    General Comments        Pertinent Vitals/Pain Pain Assessment: 0-10 Pain Score: 8  Pain Location: B knees with R > L Pain Descriptors / Indicators: Aching Pain Intervention(s): Monitored during session;Premedicated before session;Limited activity within patient's tolerance    Home Living                      Prior Function            PT Goals (current goals can now be found  in the care plan section) Progress towards PT goals: Progressing toward goals    Frequency    Min 2X/week      PT Plan Current plan remains appropriate    Co-evaluation             End of Session Equipment Utilized During Treatment: Gait belt Activity Tolerance: Patient limited by pain Patient left: in bed     Time: 1426-1450 PT Time Calculation (min) (ACUTE ONLY): 24 min  Charges:  $Gait Training: 8-22 mins $Therapeutic Exercise: 8-22 mins                    G Codes:      DRoyetta Asal PT, DPT 04/09/16, 3:09 PM

## 2016-04-09 NOTE — Progress Notes (Signed)
Patient pleasant and cooperative. Medication compliant. Denies SI, HI, AVH. Encouragement and support offered. Medications given as prescribed. Pt remains safe on unit with q 15 min checks.

## 2016-04-09 NOTE — Progress Notes (Signed)
Recreation Therapy Notes  Date: 12.04.17 Time: 9:30 am Location: Craft Room  Group Topic: Self-expression  Goal Area(s) Addresses:  Patient will identify one color per emotion listed on wheel. Patient will verbalize benefit of using art as a means of self-expression. Patient will verbalize one emotion experienced during session. Patient will be educated on other forms of self-expression.  Behavioral Response: Did not attend  Intervention: Emotion Wheel  Activity: Patients were given an Emotion Wheel worksheet and were instructed to pick a color for each emotion listed on the wheel.  Education: LRT educated patients on other forms of self-expression.  Education Outcome: Patient did not attend group.  Clinical Observations/Feedback: Patient did not attend group.  [name], LRT/CTRS 04/09/2016 10:09 AM
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
## 2016-04-10 LAB — RAPID HIV SCREEN (HIV 1/2 AB+AG)
HIV 1/2 Antibodies: NONREACTIVE
HIV-1 P24 Antigen - HIV24: NONREACTIVE

## 2016-04-10 LAB — AMMONIA: Ammonia: 17 umol/L (ref 9–35)

## 2016-04-10 LAB — VITAMIN B12: Vitamin B-12: 489 pg/mL (ref 180–914)

## 2016-04-10 MED ORDER — OLANZAPINE 5 MG PO TBDP
5.0000 mg | ORAL_TABLET | Freq: Every day | ORAL | Status: AC
  Administered 2016-04-10: 5 mg via ORAL
  Filled 2016-04-10: qty 1

## 2016-04-10 MED ORDER — OLANZAPINE 5 MG PO TBDP
10.0000 mg | ORAL_TABLET | Freq: Every day | ORAL | Status: DC
  Administered 2016-04-11: 10 mg via ORAL
  Filled 2016-04-10: qty 2

## 2016-04-10 NOTE — BHH Group Notes (Signed)
[place] Group Notes:  (Nursing/MHT/Case Management/Adjunct)  Date:  04/10/2016  Time:  10:26 PM  Type of Therapy:  Group Therapy  Participation Level:  Active  Participation Quality:  Appropriate  Affect:  Appropriate  Cognitive:  Appropriate  Insight:  Appropriate  Engagement in Group:  Engaged  Modes of Intervention:  Discussion  Summary of Progress/Problems:  Shelly Freeman 04/10/2016, 10:26 PM

## 2016-04-10 NOTE — Progress Notes (Signed)
Recreation Therapy Notes  Date: 12.05.17 Time: 9:30 am Location: Craft Room  Group Topic: Goal Setting  Goal Area(s) Addresses:  Patient will write at least one goal. Patient will write at least one obstacle.  Behavioral Response: Attentive  Intervention: Recovery Goal Chart  Activity: Patients were instructed to make a recovery goal chart including goals, obstacles, the date they started working on their goals, and the date they achieved them.  Education: LRT educated patients on ways to celebrate reaching their goals in healthy ways.  Education Outcome: Acknowledges education/In group clarification offered  Clinical Observations/Feedback: Patient wrote goals and obstacles with assistance from LRT. Patient did not contribute to group discussion.  [name], LRT/CTRS 04/10/2016 10:18 AM

## 2016-04-10 NOTE — Progress Notes (Signed)
Patient stated that her depression is getting little better because Jesus is with her.Denies suicidal or homicidal ideations & AV hallucinations.Patient is pleasant on approach.Compliant with medications.Attended some groups.Ambulates with walker.Support & encouragement given.
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
## 2016-04-10 NOTE — Plan of Care (Signed)
Problem: Coping: Goal: Ability to cope will improve Outcome: Progressing Pt is progressing towards accomplishing this goal.

## 2016-04-10 NOTE — BHH Group Notes (Signed)
[place] LCSW Group Therapy   04/10/2016 2pm   Type of Therapy: Group Therapy   Participation Level: Pt invited but did not attend.  Participation Quality: Pt invited but did not attend.   Glorious Peach, MSW, LCSWA 04/10/2016, 3:28PM

## 2016-04-11 LAB — RPR: RPR Ser Ql: NONREACTIVE

## 2016-04-11 NOTE — Progress Notes (Signed)
Recreation Therapy Notes  Date: 12.06.17 Time: 9:30 am Location: Craft Room  Group Topic: Self-esteem  Goal Area(s) Addresses:  Patient will identify at least one positive trait about self. Patient will identify at least one healthy coping skill.  Behavioral Response: Did not attend  Intervention: All About Me  Activity: Patients were instructed to make an All About Me pamphlet including their life's motto, positive traits, healthy coping skills, and their support system.   Education: LRT educated patients on ways they can increase their self-esteem.  Education Outcome: Patient did not attend group.   Clinical Observations/Feedback: Patient did not attend group.  [name], LRT/CTRS 04/11/2016 10:07 AM

## 2016-04-11 NOTE — Progress Notes (Signed)
D: Pt denies SI/HI/AVH. Pt is pleasant and cooperative, affect is flat but brightens upon approach. Pt appears less anxious and  is interacting with peers and staff appropriately. Physical therapy worked with patient  A: Encouragement and support provided. Medications given as scheduled. Pt was encouraged to attend groups. Safety checks maintained .  R: Pt attends groups and interacts well with peers and staff. Medication compliant. Pt receptive to treatment and remains safe on unit.

## 2016-04-11 NOTE — Tx Team (Signed)
Interdisciplinary Treatment and Diagnostic Plan Update  04/11/2016 Time of Session: 10:30am Shelly Freeman MRN: [id number]  Principal Diagnosis: Severe recurrent major depression with psychotic features ([place])  Secondary Diagnoses: Principal Problem:   Severe recurrent major depression with psychotic features (Shelly Freeman) Active Problems:   Intentional opiate overdose ([place])   Hypothyroidism   Lupus   GERD (gastroesophageal reflux disease)   COPD (chronic obstructive pulmonary disease) (HCC)   Current Medications:  Current Facility-Administered Medications  Medication Dose Route Frequency Provider Last Rate Last Dose  . acetaminophen (TYLENOL) tablet 650 mg  650 mg Oral Q6H PRN Gonzella Lex, MD      . alum & mag hydroxide-simeth (MAALOX/MYLANTA) 200-200-20 MG/5ML suspension 30 mL  30 mL Oral Q4H PRN Gonzella Lex, MD      . amLODipine (NORVASC) tablet 5 mg  5 mg Oral Daily Clovis Fredrickson, MD   5 mg at 04/11/16 [id number]  . DULoxetine (CYMBALTA) DR capsule 60 mg  60 mg Oral Daily Clovis Fredrickson, MD   60 mg at 04/11/16 [id number]  . famotidine (PEPCID) tablet 20 mg  20 mg Oral BID Gonzella Lex, MD   20 mg at 04/11/16 [id number]  . feeding supplement (ENSURE ENLIVE) (ENSURE ENLIVE) liquid 237 mL  237 mL Oral TID BM [name], MD   237 mL at 04/11/16 1000  . gabapentin (NEURONTIN) capsule 800 mg  800 mg Oral BID Gonzella Lex, MD   800 mg at 04/11/16 [id number]  . hydroxychloroquine (PLAQUENIL) tablet 400 mg  400 mg Oral Daily Gonzella Lex, MD   400 mg at 04/11/16 [id number]  . ibuprofen (ADVIL,MOTRIN) tablet 800 mg  800 mg Oral QID Clovis Fredrickson, MD   800 mg at 04/11/16 [id number]  . ipratropium-albuterol (DUONEB) 0.5-2.5 (3) MG/3ML nebulizer solution 3 mL  3 mL Nebulization Q6H PRN Gonzella Lex, MD      . levothyroxine (SYNTHROID, LEVOTHROID) tablet 75 mcg  75 mcg Oral QAC breakfast Gonzella Lex, MD   75 mcg at 04/11/16 [id number]  . magnesium hydroxide (MILK OF MAGNESIA) suspension 30 mL   30 mL Oral Daily PRN Gonzella Lex, MD      . OLANZapine zydis (ZYPREXA) disintegrating tablet 10 mg  10 mg Oral QHS [name], MD      . temazepam (RESTORIL) capsule 30 mg  30 mg Oral QHS Clovis Fredrickson, MD   30 mg at 04/10/16 2130  . Vitamin D (Ergocalciferol) (DRISDOL) capsule 50,000 Units  50,000 Units Oral Q7 days Gonzella Lex, MD   50,000 Units at 04/08/16 1800   PTA Medications: Prescriptions Prior to Admission  Medication Sig Dispense Refill Last Dose  . alendronate (FOSAMAX) 70 MG tablet Take 70 mg by mouth once a week. Take with a full glass of water on an empty stomach.     [name] amLODipine (NORVASC) 5 MG tablet Take 5 mg by mouth daily.     [name] b complex vitamins tablet Take 1 tablet by mouth daily.     . DULoxetine (CYMBALTA) 30 MG capsule Take 30 mg by mouth daily.     . folic acid (FOLVITE) 1 MG tablet Take 1 mg by mouth daily.     [name] gabapentin (NEURONTIN) 800 MG tablet Take 800 mg by mouth 2 (two) times daily.     [name] HYDROcodone-acetaminophen (NORCO/VICODIN) 5-325 MG tablet Take 1 tablet by mouth 2 (two) times daily as needed for moderate pain.     . hydroxychloroquine (  PLAQUENIL) 200 MG tablet Take 2 tablets (400 mg total) by mouth daily. 30 tablet 6   . ibuprofen (ADVIL,MOTRIN) 800 MG tablet Take 800 mg by mouth every 8 (eight) hours as needed (pain).     [name] levothyroxine (SYNTHROID, LEVOTHROID) 75 MCG tablet Take 75 mcg by mouth daily before breakfast.     . traMADol (ULTRAM) 50 MG tablet Take 1 tablet (50 mg total) by mouth every 6 (six) hours as needed for severe pain. 30 tablet 0   . traZODone (DESYREL) 50 MG tablet Take 1 tablet (50 mg total) by mouth at bedtime. (Patient taking differently: Take 150 mg by mouth at bedtime. ) 14 tablet 0   . Vitamin D, Ergocalciferol, (DRISDOL) 50000 units CAPS capsule Take 50,000 Units by mouth every 7 (seven) days.       Patient Stressors: Health problems Loss of a child.  Traumatic event  Patient Strengths:  General fund of knowledge Religious Affiliation  Treatment Modalities: Medication Management, Group therapy, Case management,  1 to 1 session with clinician, Psychoeducation, Recreational therapy.   Physician Treatment Plan for Primary Diagnosis: Severe recurrent major depression with psychotic features ([place]) Long Term Goal(s): Improvement in symptoms so as ready for discharge NA   Short Term Goals: Ability to identify changes in lifestyle to reduce recurrence of condition will improve Ability to verbalize feelings will improve Ability to disclose and discuss suicidal ideas Ability to demonstrate self-control will improve Ability to identify and develop effective coping behaviors will improve Ability to maintain clinical measurements within normal limits will improve Compliance with prescribed medications will improve NA  Medication Management: Evaluate patient's response, side effects, and tolerance of medication regimen.  Therapeutic Interventions: 1 to 1 sessions, Unit Group sessions and Medication administration.  Evaluation of Outcomes: Progressing  Physician Treatment Plan for Secondary Diagnosis: Principal Problem:   Severe recurrent major depression with psychotic features ([place]) Active Problems:   Intentional opiate overdose ([place])   Hypothyroidism   Lupus   GERD (gastroesophageal reflux disease)   COPD (chronic obstructive pulmonary disease) ([place])  Long Term Goal(s): Improvement in symptoms so as ready for discharge NA   Short Term Goals: Ability to identify changes in lifestyle to reduce recurrence of condition will improve Ability to verbalize feelings will improve Ability to disclose and discuss suicidal ideas Ability to demonstrate self-control will improve Ability to identify and develop effective coping behaviors will improve Ability to maintain clinical measurements within normal limits will improve Compliance with prescribed medications will improve NA      Medication Management: Evaluate patient's response, side effects, and tolerance of medication regimen.  Therapeutic Interventions: 1 to 1 sessions, Unit Group sessions and Medication administration.  Evaluation of Outcomes: Progressing   RN Treatment Plan for Primary Diagnosis: Severe recurrent major depression with psychotic features ([place]) Long Term Goal(s): Knowledge of disease and therapeutic regimen to maintain health will improve  Short Term Goals: Ability to remain free from injury will improve, Ability to verbalize frustration and anger appropriately will improve, Ability to demonstrate self-control, Ability to participate in decision making will improve, Ability to verbalize feelings will improve, Ability to disclose and discuss suicidal ideas, Ability to identify and develop effective coping behaviors will improve and Compliance with prescribed medications will improve  Medication Management: RN will administer medications as ordered by provider, will assess and evaluate patient's response and provide education to patient for prescribed medication. RN will report any adverse and/or side effects to prescribing provider.  Therapeutic Interventions: 1 on 1  counseling sessions, Psychoeducation, Medication administration, Evaluate responses to treatment, Monitor vital signs and CBGs as ordered, Perform/monitor CIWA, COWS, AIMS and Fall Risk screenings as ordered, Perform wound care treatments as ordered.  Evaluation of Outcomes: Progressing   LCSW Treatment Plan for Primary Diagnosis: Severe recurrent major depression with psychotic features ([place]) Long Term Goal(s): Safe transition to appropriate next level of care at discharge, Engage patient in therapeutic group addressing interpersonal concerns.  Short Term Goals: Engage patient in aftercare planning with referrals and resources, Increase social support, Increase ability to appropriately verbalize feelings, Increase emotional  regulation, Facilitate acceptance of mental health diagnosis and concerns and Increase skills for wellness and recovery  Therapeutic Interventions: Assess for all discharge needs, 1 to 1 time with Social worker, Explore available resources and support systems, Assess for adequacy in community support network, Educate family and significant other(s) on suicide prevention, Complete Psychosocial Assessment, Interpersonal group therapy.  Evaluation of Outcomes: Progressing   Progress in Treatment: Attending groups: Yes. Participating in groups: Yes. Taking medication as prescribed: Yes. Toleration medication: Yes. Family/Significant other contact made: No, will contact:  pt's grandaughter Patient understands diagnosis: Yes. Discussing patient identified problems/goals with staff: No. Pt listed none Medical problems stabilized or resolved: Yes. Denies suicidal/homicidal ideation: Yes. Issues/concerns per patient self-inventory: No Other: Pt was upset ablout a man named Jose who the pt says is an interpreter  New problem(s) identified: No, Describe:  none listed, interpreter requested  New Short Term/Long Term Goal(s):  Discharge Plan or Barriers: Pt Pt will discharge home to [facility] to live with the pt's granddaughter and will follow up with [employer] [facility] for for medication management, substance abuse treatment and therapy.    Reason for Continuation of Hospitalization: Anxiety Depression Hallucinations Suicidal ideation  Estimated Length of Stay: 3-5 days  Attendees: Patient: Pt was too acute to attend 04/06/2016 10:49 AM  Physician: Dr. Jerilee Hoh, MD 04/06/2016 10:49 AM  Nursing: Polly Cobia, RN 04/06/2016 10:49 AM  RN Care Manager: 04/06/2016 10:49 AM  Social Worker: Glorious Peach, MSW, [place], [place]   04/06/2016 10:49 AM  Recreational Therapist: Everitt Amber, LRT 04/06/2016 10:49 AM   Scribe for Treatment Team: Glorious Peach, MSW, [place]    04/11/2016,  10:36AM  [id number]  [id number]

## 2016-04-11 NOTE — Plan of Care (Signed)
Problem: Coping: Goal: Ability to verbalize frustrations and anger appropriately will improve Outcome: Progressing Patient verbalizing frustrations to staff.
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
## 2016-04-11 NOTE — [facility] Group Notes (Signed)
[facility] Group Notes:  (Nursing/MHT/Case Management/Adjunct)  Date:  04/11/2016  Time:  4:14 PM  Type of Therapy:  Psychoeducational Skills  Participation Level:  Did Not Attend  Charise Killian 04/11/2016, 4:14 PM

## 2016-04-11 NOTE — Plan of Care (Signed)
Problem: Activity: Goal: Interest or engagement in activities will improve Outcome: Progressing Patient spending more time out of room. Attending groups, interacting with peers.

## 2016-04-11 NOTE — Progress Notes (Signed)
D: Pt denies SI/HI/AVH. Pt is pleasant and cooperative, affect is flat but brightens upon approach. Pt appears less anxious and she is interacting with peers and staff appropriately.  A: Pt was offered support and encouragement. Pt was given scheduled medications. Pt was encouraged to attend groups. Q 15 minute checks were done for safety.  R:Pt attends groups and interacts well with peers and staff. Pt is taking medication. Pt has no complaints.Pt receptive to treatment and safety maintained on unit.   

## 2016-04-11 NOTE — [facility] Group Notes (Signed)
[facility] LCSW Group Therapy Note  Date/Time:04/11/2016, 1:00pm  Type of Therapy/Topic:  Group Therapy:  Emotion Regulation  Participation Level:  Active   Mood: Good Reported  Description of Group:    The purpose of this group is to assist patients in learning to regulate negative emotions and experience positive emotions. Patients will be guided to discuss ways in which they have been vulnerable to their negative emotions. These vulnerabilities will be juxtaposed with experiences of positive emotions or situations, and patients challenged to use positive emotions to combat negative ones. Special emphasis will be placed on coping with negative emotions in conflict situations, and patients will process healthy conflict resolution skills.  Therapeutic Goals: 1. Patient will identify two positive emotions or experiences to reflect on in order to balance out negative emotions:  2. Patient will label two or more emotions that they find the most difficult to experience:  3. Patient will be able to demonstrate positive conflict resolution skills through discussion or role plays:   Summary of Patient Progress:  Pt participated well and was able to achieve above therapeutic goals.    Therapeutic Modalities:   Cognitive Behavioral Therapy Feelings Identification Dialectical Behavioral Therapy  Dossie Arbour, LCSW

## 2016-04-11 NOTE — Progress Notes (Signed)
Physical Therapy Treatment Patient Details Name: Shelly Freeman MRN: [id number] DOB: 09-02-1950 Today's Date: 04/11/2016    History of Present Illness Patient admitted for overdose on narcotic pain medications. She was evaluated for knee pain which she reports she has had for 10 years, imaging negative. She did require intubation during previous admission.     PT Comments    Pt agreeable to PT; reports a little pain currently in Bilateral lower extremities, hips and knees. Pt ambulates well without loss of balance or need for hands on assist. Pt feels she needs to work on balance primarily and pain relief. Pt participates in stand/seated exercises with need for seated rest between exercises. Attempted hand held assist initially for stand exercises, but pt felt unable to tolerate and performs well with rolling walker and stand by assist. Discussed/educated pt on alternating between short bouts of activity and rest for pain management; pt understands.    Follow Up Recommendations  Home health PT     Equipment Recommendations  Rolling walker with 5" wheels    Recommendations for Other Services       Precautions / Restrictions Precautions Precautions: Fall Restrictions Weight Bearing Restrictions: No    Mobility  Bed Mobility               General bed mobility comments: Not tested; up walking around  Transfers Overall transfer level: Modified independent Equipment used: Rolling walker (2 wheeled) Transfers: Sit to/from Stand Sit to Stand: Modified independent (Device/Increase time)         General transfer comment: Inconsistently uses hands incorrectly with need for cues; no LOB  Ambulation/Gait Ambulation/Gait assistance: Modified independent (Device/Increase time) Ambulation Distance (Feet): 100 Feet (125 ft second walk) Assistive device: Rolling walker (2 wheeled) Gait Pattern/deviations: Step-through pattern   Gait velocity interpretation: Below normal speed  for age/gender General Gait Details: slow and steady, but no LOB    Stairs            Wheelchair Mobility    Modified Rankin (Stroke Patients Only)       Balance Overall balance assessment: Modified Independent                                  Cognition Arousal/Alertness: Awake/alert Behavior During Therapy: WFL for tasks assessed/performed Overall Cognitive Status: Within Functional Limits for tasks assessed                      Exercises General Exercises - Lower Extremity Long Arc Quad: AROM;Both;15 reps;Seated Hip ABduction/ADduction: Strengthening;Both;10 reps;Standing Straight Leg Raises: Strengthening;Both;10 reps;Standing Hip Flexion/Marching: Strengthening;Both;10 reps;Standing (2 sets) Other Exercises Other Exercises: seated rest in between stand exercises    General Comments        Pertinent Vitals/Pain Pain Assessment: 0-10 Pain Score:  (not bad now; but at times "bad") Pain Location: B hip/knees; starts in LB Pain Intervention(s): RN gave pain meds during session    Home Living                      Prior Function            PT Goals (current goals can now be found in the care plan section) Progress towards PT goals: Progressing toward goals    Frequency    Min 2X/week      PT Plan Current plan remains appropriate    Co-evaluation  End of Session   Activity Tolerance: Patient limited by pain Patient left: Other (comment) (heading to dining room)     Time: [id number] PT Time Calculation (min) (ACUTE ONLY): 26 min  Charges:  $Gait Training: 8-22 mins $Therapeutic Exercise: 8-22 mins                    G Codes:      Larae Grooms, PtA 04/11/2016, 12:43 PM

## 2016-04-12 MED ORDER — OLANZAPINE 5 MG PO TBDP
12.5000 mg | ORAL_TABLET | Freq: Every day | ORAL | Status: DC
  Administered 2016-04-12 – 2016-04-14 (×3): 12.5 mg via ORAL
  Filled 2016-04-12: qty 1
  Filled 2016-04-12: qty 2
  Filled 2016-04-12 (×2): qty 3

## 2016-04-12 NOTE — Progress Notes (Signed)
D: Pt denies SI/HI/AVH. Pt is pleasant and cooperative, affect is flat but brightens upon approach. Pt appears less anxious and she is interacting with peers and staff appropriately, no bizarre behavior noted.  A: Pt was offered support and encouragement. Pt was given scheduled medications. Pt was encouraged to attend groups. Q 15 minute checks were done for safety.  R:Pt attends groups and interacts well with peers and staff. Pt is taking medication. Pt has no complaints.Pt receptive to treatment and safety maintained on unit.

## 2016-04-12 NOTE — Progress Notes (Signed)
Patient ID: Shelly Freeman, female   DOB: 07-02-1950, 65 y.o.   MRN: [id number]  CSw was given verbal permission by the pt, in the presence of [employer] nursing student Mauri Brooklyn to speak to [place] [employer]'s Adult protective Services and provided them with any information needed to asisst the pt with any needed resources, infromation and assistance.  Alphonse Guild. Khelani Kops, LCSWA, LCAS 12/7 /17

## 2016-04-12 NOTE — [facility] Group Notes (Signed)
[facility] LCSW Group Therapy   04/12/2016 1pm   Type of Therapy: Group Therapy   Participation Level: Pt invited but did not attend.   Participation Quality: Pt invited but did not attend.  Glorious Peach, MSW, LCSW-A 04/12/2016, 2:18PM

## 2016-04-12 NOTE — Progress Notes (Signed)
Recreation Therapy Notes  Date: 12.07.17 Time: 9:30 am Location: Craft Room  Group Topic: Leisure Education  Goal Area(s) Addresses:  Patient will identify things they are grateful for. Patient will be educated on how being grateful can influence decision making.  Behavioral Response: Did not attend  Intervention: Grateful Wheel  Activity: Patients were given a I Am Grateful For worksheet and were instructed to write things they are grateful for under each category.  Education: LRT educated patients on how being grateful affects their decision making.  Education Outcome: Patient did not attend group.  Clinical Observations/Feedback: Patient did not attend group.  [name], LRT/CTRS 04/12/2016 10:05 AM

## 2016-04-12 NOTE — Progress Notes (Signed)
Patient denies SI/HI/AVH. Speaks broken english. Affect bright first am.  Utilizing walker for ambulation.  Later in the day PT to assist, unable to, reports that patient is crying and states that she is to sad to do anything then starts speaking in [place].  When went check on patient, patient sitting on side of bed no crying noted.  Denies any problems or concerns.  Support and encouragement offered.  Safety maintained.
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
## 2016-04-12 NOTE — Progress Notes (Signed)
PT Cancellation Note  Patient Details Name: Shelly Freeman MRN: [id number] DOB: November 21, 1950   Cancelled Treatment:    Reason Eval/Treat Not Completed: Patient declined, no reason specified;Other (comment). Treatment attempted; upon entry, pt found to be crying/sobbing. Attempted conversation to understand pt's distress. Pt speaks in [place] at first, but speaks random partial thought regarding staff, talking to people, confusion as to why she is here; pt later begins speaking spanish. Pt offered PT, as she mentioned working with therapist yesterday, but pt currently refuses saying she is too sad. Spoke with nursing after attempt; nurse was unaware pt was currently upset, noting she was fine earlier today. Nursing to follow up. Re attempt at a later time/date, as the schedule allows.    Larae Grooms, PTA 04/12/2016, 3:11 PM

## 2016-04-12 NOTE — Progress Notes (Signed)
Pt is alert and oriented this evening. Pt states that she would like to go home soon. Mood and affect are appropriate and she is pleasant and cooperative with staff. Writer encouraged snack prior to medications. Pt is ambulating well with walker as well. Will continue to monitor. 15 minutes checks are ongoing for safety.

## 2016-04-12 NOTE — Plan of Care (Signed)
  Problem: Coping: Goal: Ability to verbalize frustrations and anger appropriately will improve Outcome: Progressing  Patient verbalized frustrations to staff.  

## 2016-04-12 NOTE — [facility] Group Notes (Signed)
[facility] Group Notes:  (Nursing/MHT/Case Management/Adjunct)  Date:  04/12/2016  Time:  11:28 PM  Type of Therapy:  Psychoeducational Skills  Participation Level:  Did Not Attend  Participation Quality:  Shelly Freeman 04/12/2016, 11:28 PM

## 2016-04-13 MED ORDER — LORAZEPAM 0.5 MG PO TABS
0.2500 mg | ORAL_TABLET | Freq: Once | ORAL | Status: AC
  Administered 2016-04-13: 0.25 mg via ORAL
  Filled 2016-04-13: qty 1

## 2016-04-13 MED ORDER — LORAZEPAM 0.5 MG PO TABS: 0.2500 mg | ORAL_TABLET | Freq: Two times a day (BID) | ORAL | Status: DC | PRN

## 2016-04-13 NOTE — Tx Team (Signed)
Interdisciplinary Treatment and Diagnostic Plan Update  04/13/2016 Time of Session: 10:30am Shelly Freeman MRN: [id number]  Principal Diagnosis: Severe recurrent major depression with psychotic features ([place])  Secondary Diagnoses: Principal Problem:   Severe recurrent major depression with psychotic features (Shelly Freeman) Active Problems:   Intentional opiate overdose (Shelly Freeman)   Hypothyroidism   Lupus   GERD (gastroesophageal reflux disease)   COPD (chronic obstructive pulmonary disease) ([facility])   Current Medications:  Current Facility-Administered Medications  Medication Dose Route Frequency Provider Last Rate Last Dose  . acetaminophen (TYLENOL) tablet 650 mg  650 mg Oral Q6H PRN Gonzella Lex, MD      . alum & mag hydroxide-simeth (MAALOX/MYLANTA) 200-200-20 MG/5ML suspension 30 mL  30 mL Oral Q4H PRN Gonzella Lex, MD      . amLODipine (NORVASC) tablet 5 mg  5 mg Oral Daily Jolanta B Pucilowska, MD   5 mg at 04/13/16 1140  . DULoxetine (CYMBALTA) DR capsule 60 mg  60 mg Oral Daily Jolanta B Pucilowska, MD   60 mg at 04/13/16 1142  . famotidine (PEPCID) tablet 20 mg  20 mg Oral BID Gonzella Lex, MD   20 mg at 04/13/16 1141  . feeding supplement (ENSURE ENLIVE) (ENSURE ENLIVE) liquid 237 mL  237 mL Oral TID BM [name], MD   237 mL at 04/12/16 1400  . gabapentin (NEURONTIN) capsule 800 mg  800 mg Oral BID Gonzella Lex, MD   800 mg at 04/13/16 1141  . hydroxychloroquine (PLAQUENIL) tablet 400 mg  400 mg Oral Daily Gonzella Lex, MD   400 mg at 04/13/16 1143  . ibuprofen (ADVIL,MOTRIN) tablet 800 mg  800 mg Oral QID Jolanta B Pucilowska, MD   800 mg at 04/13/16 1141  . ipratropium-albuterol (DUONEB) 0.5-2.5 (3) MG/3ML nebulizer solution 3 mL  3 mL Nebulization Q6H PRN Gonzella Lex, MD      . levothyroxine (SYNTHROID, LEVOTHROID) tablet 75 mcg  75 mcg Oral QAC breakfast Gonzella Lex, MD   75 mcg at 04/13/16 1142  . magnesium hydroxide (MILK OF MAGNESIA) suspension 30 mL   30 mL Oral Daily PRN Gonzella Lex, MD      . OLANZapine zydis (ZYPREXA) disintegrating tablet 12.5 mg  12.5 mg Oral QHS [name], MD   12.5 mg at 04/12/16 2123  . temazepam (RESTORIL) capsule 30 mg  30 mg Oral QHS Clovis Fredrickson, MD   30 mg at 04/12/16 2123  . Vitamin D (Ergocalciferol) (DRISDOL) capsule 50,000 Units  50,000 Units Oral Q7 days Gonzella Lex, MD   50,000 Units at 04/08/16 1800   PTA Medications: Prescriptions Prior to Admission  Medication Sig Dispense Refill Last Dose  . alendronate (FOSAMAX) 70 MG tablet Take 70 mg by mouth once a week. Take with a full glass of water on an empty stomach.     [name] amLODipine (NORVASC) 5 MG tablet Take 5 mg by mouth daily.     [name] b complex vitamins tablet Take 1 tablet by mouth daily.     . DULoxetine (CYMBALTA) 30 MG capsule Take 30 mg by mouth daily.     . folic acid (FOLVITE) 1 MG tablet Take 1 mg by mouth daily.     [name] gabapentin (NEURONTIN) 800 MG tablet Take 800 mg by mouth 2 (two) times daily.     [name] HYDROcodone-acetaminophen (NORCO/VICODIN) 5-325 MG tablet Take 1 tablet by mouth 2 (two) times daily as needed for moderate pain.     [name]  hydroxychloroquine (PLAQUENIL) 200 MG tablet Take 2 tablets (400 mg total) by mouth daily. 30 tablet 6   . ibuprofen (ADVIL,MOTRIN) 800 MG tablet Take 800 mg by mouth every 8 (eight) hours as needed (pain).     [name] levothyroxine (SYNTHROID, LEVOTHROID) 75 MCG tablet Take 75 mcg by mouth daily before breakfast.     . traMADol (ULTRAM) 50 MG tablet Take 1 tablet (50 mg total) by mouth every 6 (six) hours as needed for severe pain. 30 tablet 0   . traZODone (DESYREL) 50 MG tablet Take 1 tablet (50 mg total) by mouth at bedtime. (Patient taking differently: Take 150 mg by mouth at bedtime. ) 14 tablet 0   . Vitamin D, Ergocalciferol, (DRISDOL) 50000 units CAPS capsule Take 50,000 Units by mouth every 7 (seven) days.       Patient Stressors: Health problems Loss of a child.  Traumatic  event  Patient Strengths: General fund of knowledge Religious Affiliation  Treatment Modalities: Medication Management, Group therapy, Case management,  1 to 1 session with clinician, Psychoeducation, Recreational therapy.   Physician Treatment Plan for Primary Diagnosis: Severe recurrent major depression with psychotic features ([name]) Long Term Goal(s): Improvement in symptoms so as ready for discharge NA   Short Term Goals: Ability to identify changes in lifestyle to reduce recurrence of condition will improve Ability to verbalize feelings will improve Ability to disclose and discuss suicidal ideas Ability to demonstrate self-control will improve Ability to identify and develop effective coping behaviors will improve Ability to maintain clinical measurements within normal limits will improve Compliance with prescribed medications will improve NA  Medication Management: Evaluate patient's response, side effects, and tolerance of medication regimen.  Therapeutic Interventions: 1 to 1 sessions, Unit Group sessions and Medication administration.  Evaluation of Outcomes: Progressing  Physician Treatment Plan for Secondary Diagnosis: Principal Problem:   Severe recurrent major depression with psychotic features (Shelly Freeman) Active Problems:   Intentional opiate overdose (Shelly Freeman)   Hypothyroidism   Lupus   GERD (gastroesophageal reflux disease)   COPD (chronic obstructive pulmonary disease) ([name])  Long Term Goal(s): Improvement in symptoms so as ready for discharge NA   Short Term Goals: Ability to identify changes in lifestyle to reduce recurrence of condition will improve Ability to verbalize feelings will improve Ability to disclose and discuss suicidal ideas Ability to demonstrate self-control will improve Ability to identify and develop effective coping behaviors will improve Ability to maintain clinical measurements within normal limits will improve Compliance with prescribed  medications will improve NA     Medication Management: Evaluate patient's response, side effects, and tolerance of medication regimen.  Therapeutic Interventions: 1 to 1 sessions, Unit Group sessions and Medication administration.  Evaluation of Outcomes: Progressing   RN Treatment Plan for Primary Diagnosis: Severe recurrent major depression with psychotic features ([place]) Long Term Goal(s): Knowledge of disease and therapeutic regimen to maintain health will improve  Short Term Goals: Ability to remain free from injury will improve, Ability to verbalize frustration and anger appropriately will improve, Ability to demonstrate self-control, Ability to participate in decision making will improve, Ability to verbalize feelings will improve, Ability to disclose and discuss suicidal ideas, Ability to identify and develop effective coping behaviors will improve and Compliance with prescribed medications will improve  Medication Management: RN will administer medications as ordered by provider, will assess and evaluate patient's response and provide education to patient for prescribed medication. RN will report any adverse and/or side effects to prescribing provider.  Therapeutic Interventions: 1 on  1 counseling sessions, Psychoeducation, Medication administration, Evaluate responses to treatment, Monitor vital signs and CBGs as ordered, Perform/monitor CIWA, COWS, AIMS and Fall Risk screenings as ordered, Perform wound care treatments as ordered.  Evaluation of Outcomes: Progressing   LCSW Treatment Plan for Primary Diagnosis: Severe recurrent major depression with psychotic features ([place]) Long Term Goal(s): Safe transition to appropriate next level of care at discharge, Engage patient in therapeutic group addressing interpersonal concerns.  Short Term Goals: Engage patient in aftercare planning with referrals and resources, Increase social support, Increase ability to appropriately verbalize  feelings, Increase emotional regulation, Facilitate acceptance of mental health diagnosis and concerns and Increase skills for wellness and recovery  Therapeutic Interventions: Assess for all discharge needs, 1 to 1 time with Social worker, Explore available resources and support systems, Assess for adequacy in community support network, Educate family and significant other(s) on suicide prevention, Complete Psychosocial Assessment, Interpersonal group therapy.  Evaluation of Outcomes: Progressing   Progress in Treatment: Attending groups: Yes. Participating in groups: Yes. Taking medication as prescribed: Yes. Toleration medication: Yes. Family/Significant other contact made: No, will contact:  pt's grandaughter Patient understands diagnosis: Yes. Discussing patient identified problems/goals with staff: No. Pt listed none Medical problems stabilized or resolved: Yes. Denies suicidal/homicidal ideation: Yes. Issues/concerns per patient self-inventory: No Other: Pt was upset ablout a man named Jose who the pt says is an interpreter  New problem(s) identified: No, Describe:  none listed, interpreter requested  New Short Term/Long Term Goal(s):  Discharge Plan or Barriers: Pt Pt will discharge home to [facility] to live with the pt's granddaughter and will follow up with [facility] for for medication management, substance abuse treatment and therapy.    Reason for Continuation of Hospitalization: Anxiety Depression Hallucinations Suicidal ideation  Estimated Length of Stay: 3-5 days  Attendees: Patient: Pt was too acute to attend 04/13/2016 10:49 AM  Physician: Dr. Jerilee Hoh, MD 04/13/2016 10:49 AM  Nursing: Elige Radon, RN 04/13/2016 10:49 AM  RN Care Manager: 04/13/2016 10:49 AM  Social Worker: Alphonse Guild. Vickie Epley  04/13/2016 10:49 AM  Recreational Therapist: Everitt Amber, LRT 04/13/2016 10:49 AM   Scribe for Treatment Team: Marylou Flesher, MSW,  [place]    04/13/2016, 10:36AM  [id number]  [id number]
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
## 2016-04-13 NOTE — Progress Notes (Signed)
Recreation Therapy Notes  Date: 12.08.17 Time: 9:30 am Location: Craft Room  Group Topic: Coping Skills  Goal Area(s) Addresses:  Patient will participate in healthy coping skill. Patient will verbalize benefit of using art as a coping skill.  Behavioral Response: Did not attend  Intervention: Coloring  Activity: Patients were given coloring sheets to color and were instructed to think about what emotions they were feeling and what their minds were focused on.  Education: LRT educated patients on healthy coping skills.  Education Outcome: Patient did not attend group.  Clinical Observations/Feedback: Patient did not attend group.  [name], LRT/CTRS 04/13/2016 10:15 AM

## 2016-04-13 NOTE — [facility] Group Notes (Signed)
[place] LCSW Group Therapy   04/13/2016 1 PM   Type of Therapy: Group Therapy   Participation Level: Pt invited but did not attend.  Participation Quality: Pt invited but did not attend.  Glorious Peach, MSW, LCSW-A 04/13/2016, 1:57AM

## 2016-04-13 NOTE — Progress Notes (Signed)
Patient pleasant and cooperative at times.  Easily irritated.  Initially refused am medications stating that he had to have a test done.   Later during day came to get all medications.  Isolates to room.  Minimal interaction with peers.   At times exhibits signs of having delusional thinking.   Support and encouragement offered, safety maintained.

## 2016-04-13 NOTE — [facility] Group Notes (Signed)
[place] Group Notes:  (Nursing/MHT/Case Management/Adjunct)  Date:  04/13/2016  Time:  5:16 PM  Type of Therapy:  Group Therapy  Participation Level:  Active  Participation Quality:  Appropriate and Attentive  Affect:  Appropriate  Cognitive:  Alert, Appropriate and Oriented  Insight:  Appropriate  Engagement in Group:  Engaged  Modes of Intervention:  Activity  Summary of Progress/Problems:  Shelly Freeman Shelly Freeman 04/13/2016, 5:16 PM

## 2016-04-14 MED ORDER — OLANZAPINE 10 MG PO TABS
5.0000 mg | ORAL_TABLET | Freq: Every day | ORAL | Status: DC
  Administered 2016-04-14 – 2016-04-16 (×3): 5 mg via ORAL
  Filled 2016-04-14 (×3): qty 1

## 2016-04-14 NOTE — Plan of Care (Signed)
Problem: Education: Goal: Mental status will improve Outcome: Progressing Patient can carry on a conversation and appears less confused.

## 2016-04-14 NOTE — Plan of Care (Signed)
Problem: Coping: Goal: Ability to verbalize frustrations and anger appropriately will improve Outcome: Progressing Patient verbalized her frustration to staff.
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
## 2016-04-14 NOTE — Progress Notes (Signed)
Patient pleasant and cooperative.  Smiles upon approach. More visible in the milieu. Interacts with roommate well.  Medication compliant.  Denies SI/HI/AVH.

## 2016-04-14 NOTE — Progress Notes (Signed)
D: Patient states, "I'm good and bad". Denies SI/HI/AVH. C/o generalized pain. Visible in the day room talking to roommate. Pleasant during interaction.  A: Medication given with education. Encouragement provided. Scheduled Ibuprofen given for pain.  R: Patient was compliant with medication. She has remained calm and cooperative. Safety maintained with 15 min checks.

## 2016-04-14 NOTE — Progress Notes (Signed)
D: Pt denies SI/HI/AVH. Pt is pleasant and cooperative, affect appears brighter. Pt stated she feels better from resting and taking her medication, she appears less anxious and she is interacting with peers and staff appropriately.  A: Pt was offered support and encouragement. Pt was given scheduled medications. Pt was encouraged to attend groups. Q 15 minute checks were done for safety.  R:Pt attends groups and interacts well with peers and staff. Pt is taking medication. Pt has no complaints.Pt receptive to treatment and safety maintained on unit.

## 2016-04-14 NOTE — [facility] Group Notes (Signed)
[facility] LCSW Group Therapy  04/14/2016 2:37 PM  Type of Therapy:  Group Therapy  Participation Level:  Patient did not attend group. CSW invited patient to group.   Summary of Progress/Problems:Stress management: Patients defined and discussed the topic of stress and the related symptoms and triggers for stress. Patients identified healthy coping skills they would like to try during hospitalization and after discharge to manage stress in a healthy way. CSW offered insight to varying stress management techniques.   Shelly Freeman G. [place], [place] 04/14/2016, 2:38 PM

## 2016-04-15 ENCOUNTER — Inpatient Hospital Stay: Payer: Medicaid Other

## 2016-04-15 DIAGNOSIS — F0151 Vascular dementia with behavioral disturbance: Secondary | ICD-10-CM

## 2016-04-15 DIAGNOSIS — F01A18 Vascular dementia, mild, with other behavioral disturbance: Secondary | ICD-10-CM

## 2016-04-15 MED ORDER — OLANZAPINE 7.5 MG PO TABS
15.0000 mg | ORAL_TABLET | Freq: Every day | ORAL | Status: DC
  Administered 2016-04-15: 15 mg via ORAL
  Filled 2016-04-15: qty 2

## 2016-04-15 MED ORDER — GADOBENATE DIMEGLUMINE 529 MG/ML IV SOLN
14.0000 mL | Freq: Once | INTRAVENOUS | Status: AC | PRN
  Administered 2016-04-15: 10 mL via INTRAVENOUS
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
## 2016-04-15 NOTE — Progress Notes (Signed)
Patient has been in her room most of the day resting, but did get up for meals. Remains medication compliant. Complained of back and leg pain, and was given pain medication as scheduled. Did go for an MRI today, which came back negative. Still a high falls risk and using a walker to ambulate. Has been seen interacting with roommate and other peers. Able to voice any concerns she may have to staff. Denies SI/HI/AVH. Remains on Q15 minute checks for safety. Will continue to monitor.

## 2016-04-15 NOTE — Plan of Care (Signed)
Problem: Safety: Goal: Ability to remain free from injury will improve Outcome: Progressing Patient has remained free from injury   

## 2016-04-15 NOTE — [facility] Group Notes (Signed)
[place] LCSW Group Therapy  04/15/2016 2:40 PM  Type of Therapy:  Group Therapy  Participation Level:  Patient did not attend group. CSW invited patient to group.   Summary of Progress/Problems:Coping Skills: Patients defined and discussed healthy coping skills. Patients identified healthy coping skills they would like to try during hospitalization and after discharge. CSW offered insight to varying coping skills that may have been new to patients such as practicing mindfulness.  Yann Biehn G. [place], [place] 04/15/2016, 2:41 PM

## 2016-04-16 MED ORDER — DULOXETINE HCL 60 MG PO CPEP: 60.0000 mg | ORAL_CAPSULE | Freq: Every day | ORAL | 1 refills | Status: DC

## 2016-04-16 MED ORDER — FAMOTIDINE 20 MG PO TABS: 20.0000 mg | ORAL_TABLET | Freq: Two times a day (BID) | ORAL | 1 refills | Status: DC

## 2016-04-16 MED ORDER — TEMAZEPAM 30 MG PO CAPS: 30.0000 mg | ORAL_CAPSULE | Freq: Every day | ORAL | 0 refills | Status: DC

## 2016-04-16 MED ORDER — OLANZAPINE 15 MG PO TABS: 15.0000 mg | ORAL_TABLET | Freq: Every day | ORAL | 1 refills | Status: DC

## 2016-04-16 MED ORDER — IPRATROPIUM-ALBUTEROL 0.5-2.5 (3) MG/3ML IN SOLN: 3.0000 mL | Freq: Four times a day (QID) | RESPIRATORY_TRACT | 1 refills | Status: DC | PRN

## 2016-04-16 NOTE — Progress Notes (Signed)
Patient discharged with AVS, Transition Record, Suicide Risk Assessment, and belongings. Went over discharged instructions with patient and interpreter. Did get a walker to take home and ambulated off the unit with it. Left hospital with her granddaughter. Denies SI/HI/AVH before discharging.

## 2016-04-16 NOTE — Progress Notes (Signed)
Recreation Therapy Notes  Date: 12.11.17 Time: 9:30 am Location: Craft Room  Group Topic: Self-expression  Goal Area(s) Addresses:  Patient will effectively use art as a means of self-expression. Patient will recognize positive benefit of self-expression. Patient will be able to identify one emotion experienced during group session. Patient will identify use of art as a coping skill.  Behavioral Response: Did not attend  Intervention: Two Faces of Me  Activity: Patients were given a blank face worksheet and were instructed to draw a line down the middle. On one side, they were instructed to draw or write how they felt when they were admitted to the hospital. On the other side, they were instructed to draw or write how they want to feel when they are d/c.  Education: LRT educated patients on other forms of self-expression.  Education Outcome: Patient did not attend group.   Clinical Observations/Feedback: Patient did not attend group.  [name], LRT/CTRS 04/16/2016 10:10 AM

## 2016-04-16 NOTE — Progress Notes (Signed)
  [facility] Adult Case Management Discharge Plan :  Will you be returning to the same living situation after discharge:  Yes,  [place]  At discharge, do you have transportation home?: Yes,  pt will be picked up by the pt's grandaughter Do you have the ability to pay for your medications: Yes,  pt will be provided with prescriptions at discharge  Release of information consent forms completed and in the chart;  Patient's signature needed at discharge.  Patient to Follow up at: Follow-up [facility] Follow up.   Why:  Open Mon-Fri from 8am-4pm for your hospital follow up and for medication managment and therapy.    Contact information: [facility] [address] [place], [place] [address] Phone: [phone] Fax: [phone]        Elyse Jarvis, MD Follow up.   Specialty:  Family Medicine Contact information: [address] [place] [address] [phone]        [facility] Follow up.   Why:  You will be contacted by [place] to assist you in finding needed resources and information, as well as Nutritional therapist information: [facility] [address] [place], [place] [address] Ph: [phone] Fax: [facility] Follow up.   Why:  You have been referred to the [facility] team for medication managment and therapy and will be contacted by them pending the review of your records for an assessment for a higher level of service.  Contact information: [facility] ACT Team [address] [place], [place] [address] Ph: [phone] Fax: [phone]          Next level of care provider has access to [place] and Suicide Prevention discussed:  Yes,  completed with pt  Have you used any form of tobacco in the last 30 days? (Cigarettes, Smokeless Tobacco, Cigars, and/or Pipes): No  Has patient been referred to the Quitline?: N/A patient is not a smoker  Patient has been referred for addiction treatment: N/A  Claudine Mouton 04/16/2016, 12:50 PM

## 2016-04-16 NOTE — BHH Suicide Risk Assessment (Signed)
[place] INPATIENT:  Family/Significant Other Suicide Prevention Education  Suicide Prevention Education:  Patient Refusal for Family/Significant Other Suicide Prevention Education: The patient Shelly Freeman has refused to provide written consent for family/significant other to be provided Family/Significant Other Suicide Prevention Education during admission and/or prior to discharge.  Physician notified. CSW completed SPE with the pt.    Alphonse Guild Shelly Freeman 04/16/2016, 12:22 PM

## 2016-04-16 NOTE — Plan of Care (Signed)
Problem: Coping: Goal: Ability to verbalize frustrations and anger appropriately will improve Outcome: Progressing Patient able to verbalize frustrations at this time , more open to verbal communication [place] RN

## 2016-04-16 NOTE — BHH Suicide Risk Assessment (Signed)
[facility] Discharge Suicide Risk Assessment   Principal Problem: Severe recurrent major depression with psychotic features [facility]) Discharge Diagnoses:  Patient Active Problem List   Diagnosis Date Noted  . Major neurocognitive disorder, due to vascular disease, with behavioral disturbance, mild [F01.51] 04/15/2016  . Severe recurrent major depression with psychotic features ([place]) [F33.3] 04/04/2016  . Lupus [L93.0] 04/04/2016  . GERD (gastroesophageal reflux disease) [K21.9] 04/04/2016  . COPD (chronic obstructive pulmonary disease) ([place]) [J44.9] 04/04/2016  . Acute respiratory failure with hypoxia ([place]) [J96.01] 04/03/2016  . Encephalopathy, metabolic [id number]  . Hypokalemia [E87.6] 04/03/2016  . Pain in limb [M79.609] 04/03/2016  . Hypothyroidism [E03.9] 04/03/2016  . Essential hypertension [I10] 04/03/2016  . Intentional opiate overdose ([place]) [T40.602A] 03/30/2016    Total Time spent with patient: 30 minutes  Musculoskeletal: Strength & Muscle Tone: within normal limits Gait & Station: normal Patient leans: N/A  Psychiatric Specialty Exam: Review of Systems  Psychiatric/Behavioral: Positive for depression.  All other systems reviewed and are negative.   Blood pressure 127/67, pulse 80, temperature 98.6 F (37 C), temperature source Oral, resp. rate 18, height 5\' 1"  (1.549 m), weight 69.9 kg (154 lb), SpO2 98 %.Body mass index is 29.1 kg/m.  General Appearance: Casual  Eye Contact::  Good  Speech:  Clear and Coherent409  Volume:  Normal  Mood:  Euthymic  Affect:  Appropriate  Thought Process:  Goal Directed and Descriptions of Associations: Intact  Orientation:  Full (Time, Place, and Person)  Thought Content:  WDL  Suicidal Thoughts:  No  Homicidal Thoughts:  No  Memory:  Immediate;   Fair Recent;   Fair Remote;   Fair  Judgement:  Impaired  Insight:  Shallow  Psychomotor Activity:  Normal  Concentration:  Fair  Recall:  [employer] of Knowledge:Fair   Language: Fair  Akathisia:  No  Handed:  Right  AIMS (if indicated):     Assets:  Communication Skills Desire for Improvement Financial Resources/Insurance Housing Physical Health Resilience Social Support  Sleep:  Number of Hours: 7  Cognition: WNL  ADL's:  Intact   Mental Status Per Nursing Assessment::   On Admission:  Self-harm thoughts  Demographic Factors:  Divorced or widowed  Loss Factors: NA  Historical Factors: Prior suicide attempts, Family history of mental illness or substance abuse and Impulsivity  Risk Reduction Factors:   Sense of responsibility to family, Living with another person, especially a relative and Positive social support  Continued Clinical Symptoms:  Depression:   Impulsivity  Cognitive Features That Contribute To Risk:  None    Suicide Risk:  Minimal: No identifiable suicidal ideation.  Patients presenting with no risk factors but with morbid ruminations; may be classified as minimal risk based on the severity of the depressive symptoms  Follow-up [facility] INTERPRE[facility] Follow up.   Why:  DO NOT SEND NOR COMPLETE NEEDS SPANISH INTERPRETER Contact information: [facility] [address] [place], [place] [address] Phone: [phone] Fax: [phone]       Elyse Jarvis, MD Follow up.   Specialty:  Family Medicine Contact information: [address] [place] [address] [phone]        [facility] Follow up.   Why:  You will be contacted by [facility] to asist you in finding needed resources and information, as well as Nutritional therapist information: [facility] [employer]  [place] [address] [place], [place] [address] Ph: [phone] Fax: [phone]          Plan Of Care/Follow-up  recommendations:  Activity:  as tolerated. Diet:  low sodium heart healthy. Other:  keep follow up appointments.  Orson Slick, MD 04/16/2016, 8:48 AM

## 2016-04-16 NOTE — Progress Notes (Signed)
D: Patient is alert and oriented on the unit this shift. Patient not  attended and actively participated in groups today. Patient denies suicidal ideation, homicidal ideation, auditory or visual hallucinations at the present time.  A: Scheduled medications are administered to patient as per MD orders. Emotional support and encouragement are provided. Patient is maintained on q.15 minute safety checks. Patient is informed to notify staff with questions or concerns. R: No adverse medication reactions are noted. Patient is cooperative with medication administration  . Patient is receptive, calm ,sad and cooperative on the unit at this time. Patient does not interact others on the unit this shift. Patient contracts for safety at this time. Patient remains safe at this time. Anxiety 4/10 Depression 5/10
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
## 2016-04-16 NOTE — Tx Team (Signed)
Interdisciplinary Treatment and Diagnostic Plan Update  04/16/2016 Time of Session: 10:30am Shelly Freeman MRN: [id number]  Principal Diagnosis: Severe recurrent major depression with psychotic features ([place])  Secondary Diagnoses: Principal Problem:   Severe recurrent major depression with psychotic features ([place]) Active Problems:   Intentional opiate overdose ([place])   Hypothyroidism   Lupus   GERD (gastroesophageal reflux disease)   COPD (chronic obstructive pulmonary disease) (HCC)   Major neurocognitive disorder, due to vascular disease, with behavioral disturbance, mild   Current Medications:  Current Facility-Administered Medications  Medication Dose Route Frequency Provider Last Rate Last Dose  . acetaminophen (TYLENOL) tablet 650 mg  650 mg Oral Q6H PRN Gonzella Lex, MD      . alum & mag hydroxide-simeth (MAALOX/MYLANTA) 200-200-20 MG/5ML suspension 30 mL  30 mL Oral Q4H PRN Gonzella Lex, MD      . amLODipine (NORVASC) tablet 5 mg  5 mg Oral Daily Jolanta B Pucilowska, MD   5 mg at 04/16/16 0810  . DULoxetine (CYMBALTA) DR capsule 60 mg  60 mg Oral Daily Jolanta B Pucilowska, MD   60 mg at 04/16/16 0809  . famotidine (PEPCID) tablet 20 mg  20 mg Oral BID Gonzella Lex, MD   20 mg at 04/16/16 0810  . feeding supplement (ENSURE ENLIVE) (ENSURE ENLIVE) liquid 237 mL  237 mL Oral TID BM [name], MD   237 mL at 04/16/16 1000  . gabapentin (NEURONTIN) capsule 800 mg  800 mg Oral BID Gonzella Lex, MD   800 mg at 04/16/16 [id number]  . hydroxychloroquine (PLAQUENIL) tablet 400 mg  400 mg Oral Daily Gonzella Lex, MD   400 mg at 04/16/16 0810  . ibuprofen (ADVIL,MOTRIN) tablet 800 mg  800 mg Oral QID Clovis Fredrickson, MD   800 mg at 04/16/16 1221  . ipratropium-albuterol (DUONEB) 0.5-2.5 (3) MG/3ML nebulizer solution 3 mL  3 mL Nebulization Q6H PRN Gonzella Lex, MD      . levothyroxine (SYNTHROID, LEVOTHROID) tablet 75 mcg  75 mcg Oral QAC breakfast Gonzella Lex, MD   75 mcg at 04/16/16 0654  . LORazepam (ATIVAN) tablet 0.25 mg  0.25 mg Oral BID PRN [name], MD      . magnesium hydroxide (MILK OF MAGNESIA) suspension 30 mL  30 mL Oral Daily PRN Gonzella Lex, MD      . OLANZapine (ZYPREXA) tablet 15 mg  15 mg Oral QHS [name], MD   15 mg at 04/15/16 2114  . OLANZapine (ZYPREXA) tablet 5 mg  5 mg Oral Daily [name], MD   5 mg at 04/16/16 0809  . temazepam (RESTORIL) capsule 30 mg  30 mg Oral QHS Clovis Fredrickson, MD   30 mg at 04/15/16 2114  . Vitamin D (Ergocalciferol) (DRISDOL) capsule 50,000 Units  50,000 Units Oral Q7 days Gonzella Lex, MD   50,000 Units at 04/15/16 1611   PTA Medications: Prescriptions Prior to Admission  Medication Sig Dispense Refill Last Dose  . alendronate (FOSAMAX) 70 MG tablet Take 70 mg by mouth once a week. Take with a full glass of water on an empty stomach.     [name] amLODipine (NORVASC) 5 MG tablet Take 5 mg by mouth daily.     [name] b complex vitamins tablet Take 1 tablet by mouth daily.     . DULoxetine (CYMBALTA) 30 MG capsule Take 30 mg by mouth daily.     . folic acid (FOLVITE) 1 MG  tablet Take 1 mg by mouth daily.     [name] gabapentin (NEURONTIN) 800 MG tablet Take 800 mg by mouth 2 (two) times daily.     [name] HYDROcodone-acetaminophen (NORCO/VICODIN) 5-325 MG tablet Take 1 tablet by mouth 2 (two) times daily as needed for moderate pain.     . hydroxychloroquine (PLAQUENIL) 200 MG tablet Take 2 tablets (400 mg total) by mouth daily. 30 tablet 6   . ibuprofen (ADVIL,MOTRIN) 800 MG tablet Take 800 mg by mouth every 8 (eight) hours as needed (pain).     [name] levothyroxine (SYNTHROID, LEVOTHROID) 75 MCG tablet Take 75 mcg by mouth daily before breakfast.     . traMADol (ULTRAM) 50 MG tablet Take 1 tablet (50 mg total) by mouth every 6 (six) hours as needed for severe pain. 30 tablet 0   . traZODone (DESYREL) 50 MG tablet Take 1 tablet (50 mg total) by mouth at bedtime.  (Patient taking differently: Take 150 mg by mouth at bedtime. ) 14 tablet 0   . Vitamin D, Ergocalciferol, (DRISDOL) 50000 units CAPS capsule Take 50,000 Units by mouth every 7 (seven) days.       Patient Stressors: Health problems Loss of a child.  Traumatic event  Patient Strengths: General fund of knowledge Religious Affiliation  Treatment Modalities: Medication Management, Group therapy, Case management,  1 to 1 session with clinician, Psychoeducation, Recreational therapy.   Physician Treatment Plan for Primary Diagnosis: Severe recurrent major depression with psychotic features ([place]) Long Term Goal(s): Improvement in symptoms so as ready for discharge NA   Short Term Goals: Ability to identify changes in lifestyle to reduce recurrence of condition will improve Ability to verbalize feelings will improve Ability to disclose and discuss suicidal ideas Ability to demonstrate self-control will improve Ability to identify and develop effective coping behaviors will improve Ability to maintain clinical measurements within normal limits will improve Compliance with prescribed medications will improve NA  Medication Management: Evaluate patient's response, side effects, and tolerance of medication regimen.  Therapeutic Interventions: 1 to 1 sessions, Unit Group sessions and Medication administration.  Evaluation of Outcomes: Adequate for discharge   Physician Treatment Plan for Secondary Diagnosis: Principal Problem:   Severe recurrent major depression with psychotic features ([place]) Active Problems:   Intentional opiate overdose ([place])   Hypothyroidism   Lupus   GERD (gastroesophageal reflux disease)   COPD (chronic obstructive pulmonary disease) (HCC)   Major neurocognitive disorder, due to vascular disease, with behavioral disturbance, mild  Long Term Goal(s): Improvement in symptoms so as ready for discharge NA   Short Term Goals: Ability to identify changes in lifestyle  to reduce recurrence of condition will improve Ability to verbalize feelings will improve Ability to disclose and discuss suicidal ideas Ability to demonstrate self-control will improve Ability to identify and develop effective coping behaviors will improve Ability to maintain clinical measurements within normal limits will improve Compliance with prescribed medications will improve NA     Medication Management: Evaluate patient's response, side effects, and tolerance of medication regimen.  Therapeutic Interventions: 1 to 1 sessions, Unit Group sessions and Medication administration.  Evaluation of Outcomes: Adequate for discharge   RN Treatment Plan for Primary Diagnosis: Severe recurrent major depression with psychotic features ([place]) Long Term Goal(s): Knowledge of disease and therapeutic regimen to maintain health will improve  Short Term Goals: Ability to remain free from injury will improve, Ability to verbalize frustration and anger appropriately will improve, Ability to demonstrate self-control, Ability to participate in decision making  will improve, Ability to verbalize feelings will improve, Ability to disclose and discuss suicidal ideas, Ability to identify and develop effective coping behaviors will improve and Compliance with prescribed medications will improve  Medication Management: RN will administer medications as ordered by provider, will assess and evaluate patient's response and provide education to patient for prescribed medication. RN will report any adverse and/or side effects to prescribing provider.  Therapeutic Interventions: 1 on 1 counseling sessions, Psychoeducation, Medication administration, Evaluate responses to treatment, Monitor vital signs and CBGs as ordered, Perform/monitor CIWA, COWS, AIMS and Fall Risk screenings as ordered, Perform wound care treatments as ordered.  Evaluation of Outcomes: Adequate for discharge   LCSW Treatment Plan for Primary  Diagnosis: Severe recurrent major depression with psychotic features ([place]) Long Term Goal(s): Safe transition to appropriate next level of care at discharge, Engage patient in therapeutic group addressing interpersonal concerns.  Short Term Goals: Engage patient in aftercare planning with referrals and resources, Increase social support, Increase ability to appropriately verbalize feelings, Increase emotional regulation, Facilitate acceptance of mental health diagnosis and concerns and Increase skills for wellness and recovery  Therapeutic Interventions: Assess for all discharge needs, 1 to 1 time with Social worker, Explore available resources and support systems, Assess for adequacy in community support network, Educate family and significant other(s) on suicide prevention, Complete Psychosocial Assessment, Interpersonal group therapy.  Evaluation of Outcomes: Progressing   Progress in Treatment: Attending groups: Yes. Participating in groups: Yes. Taking medication as prescribed: Yes. Toleration medication: Yes. Family/Significant other contact made: No, will contact:  Shelly Freeman's grandaughter Patient understands diagnosis: Yes. Discussing patient identified problems/goals with staff: No. Shelly Freeman listed none Medical problems stabilized or resolved: Yes. Denies suicidal/homicidal ideation: Yes. Issues/concerns per patient self-inventory: No Other: Shelly Freeman was upset ablout a man named Jose who the Shelly Freeman says is an interpreter  New problem(s) identified: No, Describe:  none listed, interpreter requested  New Short Term/Long Term Goal(s):  Discharge Plan or Barriers: Shelly Freeman Shelly Freeman will discharge home to [facility] to live with the Shelly Freeman's granddaughter and will follow up with [facility] for for medication management, substance abuse treatment and therapy.  Shelly Freeman has also been referred to [facility] for a higher level of service for for medication management and therapy in the home and to the  [facility] for assistance with needed food and money after discharge.      Reason for Continuation of Hospitalization: Anxiety Depression Hallucinations Suicidal ideation  Estimated Length of Stay: 3-5 days  Attendees: Patient: Shelly Freeman was too acute to attend 04/13/2016 10:49 AM  Physician: Dr. Jerilee Hoh, MD 04/13/2016 10:49 AM  Nursing: Elige Radon, RN 04/13/2016 10:49 AM  RN Care Manager: 04/13/2016 10:49 AM  Social Worker: Alphonse Guild. Vickie Epley  04/13/2016 10:49 AM  Recreational Therapist: Everitt Amber, LRT 04/13/2016 10:49 AM   Scribe for Treatment Team: Marylou Flesher, MSW, LCSWA  Updated 04/16/16 by Alphonse Guild. Vickie Epley          04/13/2016, 10:36AM  [id number]  [id number]

## 2016-05-14 DIAGNOSIS — M5136 Other intervertebral disc degeneration, lumbar region: Secondary | ICD-10-CM

## 2016-05-14 DIAGNOSIS — E559 Vitamin D deficiency, unspecified: Secondary | ICD-10-CM

## 2016-05-14 DIAGNOSIS — M51369 Other intervertebral disc degeneration, lumbar region without mention of lumbar back pain or lower extremity pain: Secondary | ICD-10-CM

## 2016-05-14 DIAGNOSIS — D1730 Benign lipomatous neoplasm of skin and subcutaneous tissue of unspecified sites: Secondary | ICD-10-CM | POA: Insufficient documentation

## 2016-05-14 DIAGNOSIS — M5030 Other cervical disc degeneration, unspecified cervical region: Secondary | ICD-10-CM | POA: Insufficient documentation

## 2016-05-14 HISTORY — DX: Other intervertebral disc degeneration, lumbar region without mention of lumbar back pain or lower extremity pain: M51.369

## 2016-05-14 HISTORY — DX: Other intervertebral disc degeneration, lumbar region: M51.36

## 2016-05-14 HISTORY — DX: Vitamin D deficiency, unspecified: E55.9

## 2016-06-26 DIAGNOSIS — R309 Painful micturition, unspecified: Secondary | ICD-10-CM | POA: Insufficient documentation

## 2016-10-24 DIAGNOSIS — R1011 Right upper quadrant pain: Secondary | ICD-10-CM | POA: Insufficient documentation

## 2016-10-24 DIAGNOSIS — M25562 Pain in left knee: Secondary | ICD-10-CM | POA: Insufficient documentation

## 2017-04-10 ENCOUNTER — Other Ambulatory Visit: Payer: Self-pay | Admitting: Student

## 2017-04-10 DIAGNOSIS — R1013 Epigastric pain: Secondary | ICD-10-CM

## 2017-04-10 DIAGNOSIS — R1011 Right upper quadrant pain: Secondary | ICD-10-CM

## 2017-04-12 ENCOUNTER — Ambulatory Visit
Admission: RE | Admit: 2017-04-12 | Discharge: 2017-04-12 | Disposition: A | Discharge status: Home / Self Care | Payer: Medicaid Other | Source: Ambulatory Visit | Attending: Student | Admitting: Student

## 2017-04-12 DIAGNOSIS — R1013 Epigastric pain: Secondary | ICD-10-CM | POA: Insufficient documentation

## 2017-04-12 DIAGNOSIS — R1011 Right upper quadrant pain: Secondary | ICD-10-CM | POA: Insufficient documentation

## 2017-04-15 ENCOUNTER — Encounter: Payer: Self-pay | Admitting: Emergency Medicine

## 2017-04-15 ENCOUNTER — Emergency Department: Payer: Medicaid Other

## 2017-04-15 ENCOUNTER — Inpatient Hospital Stay
Admission: EM | Admit: 2017-04-15 | Discharge: 2017-04-23 | DRG: 193 | Disposition: A | Discharge status: Home Health | Payer: Medicaid Other | Attending: Internal Medicine | Admitting: Internal Medicine

## 2017-04-15 ENCOUNTER — Other Ambulatory Visit: Payer: Self-pay

## 2017-04-15 DIAGNOSIS — Y9301 Activity, walking, marching and hiking: Secondary | ICD-10-CM | POA: Diagnosis present

## 2017-04-15 DIAGNOSIS — J441 Chronic obstructive pulmonary disease with (acute) exacerbation: Secondary | ICD-10-CM | POA: Diagnosis present

## 2017-04-15 DIAGNOSIS — E039 Hypothyroidism, unspecified: Secondary | ICD-10-CM | POA: Diagnosis present

## 2017-04-15 DIAGNOSIS — M1990 Unspecified osteoarthritis, unspecified site: Secondary | ICD-10-CM | POA: Diagnosis present

## 2017-04-15 DIAGNOSIS — W1830XA Fall on same level, unspecified, initial encounter: Secondary | ICD-10-CM | POA: Diagnosis present

## 2017-04-15 DIAGNOSIS — F419 Anxiety disorder, unspecified: Secondary | ICD-10-CM | POA: Diagnosis present

## 2017-04-15 DIAGNOSIS — Z79899 Other long term (current) drug therapy: Secondary | ICD-10-CM

## 2017-04-15 DIAGNOSIS — Z9884 Bariatric surgery status: Secondary | ICD-10-CM | POA: Diagnosis not present

## 2017-04-15 DIAGNOSIS — J9601 Acute respiratory failure with hypoxia: Secondary | ICD-10-CM | POA: Diagnosis present

## 2017-04-15 DIAGNOSIS — M6282 Rhabdomyolysis: Secondary | ICD-10-CM | POA: Diagnosis present

## 2017-04-15 DIAGNOSIS — Y92099 Unspecified place in other non-institutional residence as the place of occurrence of the external cause: Secondary | ICD-10-CM

## 2017-04-15 DIAGNOSIS — R911 Solitary pulmonary nodule: Secondary | ICD-10-CM

## 2017-04-15 DIAGNOSIS — J181 Lobar pneumonia, unspecified organism: Principal | ICD-10-CM | POA: Diagnosis present

## 2017-04-15 DIAGNOSIS — Z7983 Long term (current) use of bisphosphonates: Secondary | ICD-10-CM | POA: Diagnosis not present

## 2017-04-15 DIAGNOSIS — I10 Essential (primary) hypertension: Secondary | ICD-10-CM | POA: Diagnosis present

## 2017-04-15 DIAGNOSIS — Z23 Encounter for immunization: Secondary | ICD-10-CM

## 2017-04-15 DIAGNOSIS — J9621 Acute and chronic respiratory failure with hypoxia: Secondary | ICD-10-CM | POA: Diagnosis present

## 2017-04-15 DIAGNOSIS — K219 Gastro-esophageal reflux disease without esophagitis: Secondary | ICD-10-CM | POA: Diagnosis present

## 2017-04-15 DIAGNOSIS — R0602 Shortness of breath: Secondary | ICD-10-CM

## 2017-04-15 DIAGNOSIS — M329 Systemic lupus erythematosus, unspecified: Secondary | ICD-10-CM | POA: Diagnosis present

## 2017-04-15 DIAGNOSIS — J440 Chronic obstructive pulmonary disease with acute lower respiratory infection: Secondary | ICD-10-CM | POA: Diagnosis present

## 2017-04-15 DIAGNOSIS — J45901 Unspecified asthma with (acute) exacerbation: Secondary | ICD-10-CM | POA: Diagnosis not present

## 2017-04-15 DIAGNOSIS — G709 Myoneural disorder, unspecified: Secondary | ICD-10-CM | POA: Diagnosis present

## 2017-04-15 DIAGNOSIS — Z8679 Personal history of other diseases of the circulatory system: Secondary | ICD-10-CM

## 2017-04-15 DIAGNOSIS — M542 Cervicalgia: Secondary | ICD-10-CM

## 2017-04-15 DIAGNOSIS — J209 Acute bronchitis, unspecified: Secondary | ICD-10-CM | POA: Diagnosis present

## 2017-04-15 DIAGNOSIS — M359 Systemic involvement of connective tissue, unspecified: Secondary | ICD-10-CM | POA: Diagnosis present

## 2017-04-15 DIAGNOSIS — F329 Major depressive disorder, single episode, unspecified: Secondary | ICD-10-CM | POA: Diagnosis present

## 2017-04-15 HISTORY — DX: Systemic involvement of connective tissue, unspecified: M35.9

## 2017-04-15 LAB — CBC WITH DIFFERENTIAL/PLATELET
Basophils Absolute: 0 10*3/uL (ref 0–0.1)
Basophils Relative: 1 %
Eosinophils Absolute: 0 10*3/uL (ref 0–0.7)
Eosinophils Relative: 0 %
HCT: 30.4 % — ABNORMAL LOW (ref 35.0–47.0)
Hemoglobin: 9.8 g/dL — ABNORMAL LOW (ref 12.0–16.0)
Lymphocytes Relative: 7 %
Lymphs Abs: 0.6 10*3/uL — ABNORMAL LOW (ref 1.0–3.6)
MCH: 25.5 pg — ABNORMAL LOW (ref 26.0–34.0)
MCHC: 32.3 g/dL (ref 32.0–36.0)
MCV: 79.1 fL — ABNORMAL LOW (ref 80.0–100.0)
Monocytes Absolute: 0.6 10*3/uL (ref 0.2–0.9)
Monocytes Relative: 7 %
Neutro Abs: 7.4 10*3/uL — ABNORMAL HIGH (ref 1.4–6.5)
Neutrophils Relative %: 85 %
Platelets: 192 10*3/uL (ref 150–440)
RBC: 3.84 MIL/uL (ref 3.80–5.20)
RDW: 15.5 % — ABNORMAL HIGH (ref 11.5–14.5)
WBC: 8.7 10*3/uL (ref 3.6–11.0)

## 2017-04-15 LAB — PROTIME-INR
INR: 0.99
Prothrombin Time: 13 seconds (ref 11.4–15.2)

## 2017-04-15 LAB — COMPREHENSIVE METABOLIC PANEL
ALT: 23 U/L (ref 14–54)
AST: 56 U/L — ABNORMAL HIGH (ref 15–41)
Albumin: 3.5 g/dL (ref 3.5–5.0)
Alkaline Phosphatase: 75 U/L (ref 38–126)
Anion gap: 13 (ref 5–15)
BUN: 18 mg/dL (ref 6–20)
CO2: 20 mmol/L — ABNORMAL LOW (ref 22–32)
Calcium: 8.5 mg/dL — ABNORMAL LOW (ref 8.9–10.3)
Chloride: 107 mmol/L (ref 101–111)
Creatinine, Ser: 0.87 mg/dL (ref 0.44–1.00)
GFR calc Af Amer: >60 mL/min (ref >60)
GFR calc non Af Amer: >60 mL/min (ref >60)
Glucose, Bld: 110 mg/dL — ABNORMAL HIGH (ref 65–99)
Potassium: 4.4 mmol/L (ref 3.5–5.1)
Sodium: 140 mmol/L (ref 135–145)
Total Bilirubin: 0.5 mg/dL (ref 0.3–1.2)
Total Protein: 6.7 g/dL (ref 6.5–8.1)

## 2017-04-15 LAB — URINALYSIS, ROUTINE W REFLEX MICROSCOPIC
Bilirubin Urine: NEGATIVE
Glucose, UA: NEGATIVE mg/dL
Hgb urine dipstick: NEGATIVE
Ketones, ur: NEGATIVE mg/dL
Leukocytes, UA: NEGATIVE
Nitrite: NEGATIVE
Protein, ur: NEGATIVE mg/dL
Specific Gravity, Urine: 1.014 (ref 1.005–1.030)
pH: 5 (ref 5.0–8.0)

## 2017-04-15 LAB — INFLUENZA PANEL BY PCR (TYPE A & B)
Influenza A By PCR: NEGATIVE
Influenza B By PCR: NEGATIVE

## 2017-04-15 LAB — MRSA PCR SCREENING: MRSA by PCR: NEGATIVE

## 2017-04-15 LAB — CK: Total CK: 1418 U/L — ABNORMAL HIGH (ref 38–234)

## 2017-04-15 LAB — TROPONIN I: Troponin I: <0.03 ng/mL (ref <0.03)

## 2017-04-15 LAB — LIPASE, BLOOD: Lipase: 17 U/L (ref 11–51)

## 2017-04-15 LAB — LACTIC ACID, PLASMA: Lactic Acid, Venous: 1.1 mmol/L (ref 0.5–1.9)

## 2017-04-15 MED ORDER — GABAPENTIN 400 MG PO CAPS
800.0000 mg | ORAL_CAPSULE | Freq: Two times a day (BID) | ORAL | Status: DC
  Administered 2017-04-15 – 2017-04-23 (×17): 800 mg via ORAL
  Filled 2017-04-15 (×17): qty 2

## 2017-04-15 MED ORDER — METHYLPREDNISOLONE SODIUM SUCC 125 MG IJ SOLR
125.0000 mg | INTRAMUSCULAR | Status: AC
  Administered 2017-04-15: 125 mg via INTRAVENOUS
  Filled 2017-04-15: qty 2

## 2017-04-15 MED ORDER — DOCUSATE SODIUM 100 MG PO CAPS
100.0000 mg | ORAL_CAPSULE | Freq: Two times a day (BID) | ORAL | Status: DC
  Administered 2017-04-15 – 2017-04-23 (×17): 100 mg via ORAL
  Filled 2017-04-15 (×17): qty 1

## 2017-04-15 MED ORDER — SODIUM CHLORIDE 0.9 % IV SOLN
INTRAVENOUS | Status: DC
  Administered 2017-04-15 – 2017-04-17 (×3): via INTRAVENOUS

## 2017-04-15 MED ORDER — HYDROCOD POLST-CPM POLST ER 10-8 MG/5ML PO SUER
5.0000 mL | Freq: Two times a day (BID) | ORAL | Status: DC
  Administered 2017-04-15 – 2017-04-16 (×3): 5 mL via ORAL
  Filled 2017-04-15 (×3): qty 5

## 2017-04-15 MED ORDER — METOPROLOL SUCCINATE ER 25 MG PO TB24
12.5000 mg | ORAL_TABLET | Freq: Every day | ORAL | Status: DC
  Administered 2017-04-15 – 2017-04-23 (×9): 12.5 mg via ORAL
  Filled 2017-04-15 (×2): qty 1
  Filled 2017-04-15: qty 0.5
  Filled 2017-04-15 (×7): qty 1

## 2017-04-15 MED ORDER — ORAL CARE MOUTH RINSE
15.0000 mL | Freq: Two times a day (BID) | OROMUCOSAL | Status: DC
  Administered 2017-04-15 – 2017-04-23 (×7): 15 mL via OROMUCOSAL

## 2017-04-15 MED ORDER — HYDROXYCHLOROQUINE SULFATE 200 MG PO TABS
400.0000 mg | ORAL_TABLET | Freq: Every day | ORAL | Status: DC
  Administered 2017-04-15: 400 mg via ORAL
  Filled 2017-04-15: qty 2

## 2017-04-15 MED ORDER — ISOSORBIDE MONONITRATE ER 30 MG PO TB24
30.0000 mg | ORAL_TABLET | Freq: Every day | ORAL | Status: DC
  Administered 2017-04-15 – 2017-04-23 (×9): 30 mg via ORAL
  Filled 2017-04-15 (×9): qty 1

## 2017-04-15 MED ORDER — AZITHROMYCIN 500 MG PO TABS
500.0000 mg | ORAL_TABLET | Freq: Once | ORAL | Status: AC
  Administered 2017-04-15: 500 mg via ORAL
  Filled 2017-04-15: qty 1

## 2017-04-15 MED ORDER — DULOXETINE HCL 60 MG PO CPEP
60.0000 mg | ORAL_CAPSULE | Freq: Every day | ORAL | Status: DC
  Administered 2017-04-15 – 2017-04-16 (×2): 60 mg via ORAL
  Filled 2017-04-15 (×2): qty 1

## 2017-04-15 MED ORDER — B COMPLEX-C PO TABS
1.0000 | ORAL_TABLET | Freq: Every day | ORAL | Status: DC
  Administered 2017-04-15 – 2017-04-23 (×9): 1 via ORAL
  Filled 2017-04-15 (×9): qty 1

## 2017-04-15 MED ORDER — METHYLPREDNISOLONE SODIUM SUCC 125 MG IJ SOLR
60.0000 mg | INTRAMUSCULAR | Status: DC
  Administered 2017-04-16 – 2017-04-18 (×3): 60 mg via INTRAVENOUS
  Filled 2017-04-15 (×3): qty 2

## 2017-04-15 MED ORDER — ENOXAPARIN SODIUM 40 MG/0.4ML ~~LOC~~ SOLN
40.0000 mg | SUBCUTANEOUS | Status: DC
  Administered 2017-04-15 – 2017-04-22 (×8): 40 mg via SUBCUTANEOUS
  Filled 2017-04-15 (×8): qty 0.4

## 2017-04-15 MED ORDER — IPRATROPIUM-ALBUTEROL 0.5-2.5 (3) MG/3ML IN SOLN
3.0000 mL | Freq: Once | RESPIRATORY_TRACT | Status: AC
  Administered 2017-04-15: 3 mL via RESPIRATORY_TRACT

## 2017-04-15 MED ORDER — AZITHROMYCIN 250 MG PO TABS
250.0000 mg | ORAL_TABLET | Freq: Every day | ORAL | Status: AC
  Administered 2017-04-16 – 2017-04-19 (×4): 250 mg via ORAL
  Filled 2017-04-15 (×4): qty 1

## 2017-04-15 MED ORDER — TRAMADOL HCL 50 MG PO TABS
50.0000 mg | ORAL_TABLET | Freq: Four times a day (QID) | ORAL | Status: DC | PRN
  Administered 2017-04-15 – 2017-04-19 (×6): 50 mg via ORAL
  Filled 2017-04-15 (×6): qty 1

## 2017-04-15 MED ORDER — BISACODYL 5 MG PO TBEC: 5.0000 mg | DELAYED_RELEASE_TABLET | Freq: Every day | ORAL | Status: DC | PRN

## 2017-04-15 MED ORDER — FOLIC ACID 1 MG PO TABS
1.0000 mg | ORAL_TABLET | Freq: Every day | ORAL | Status: DC
  Administered 2017-04-15 – 2017-04-23 (×9): 1 mg via ORAL
  Filled 2017-04-15 (×9): qty 1

## 2017-04-15 MED ORDER — AZITHROMYCIN 500 MG PO TABS
500.0000 mg | ORAL_TABLET | Freq: Every day | ORAL | Status: AC
  Administered 2017-04-15: 500 mg via ORAL

## 2017-04-15 MED ORDER — LORAZEPAM 0.5 MG PO TABS
0.5000 mg | ORAL_TABLET | Freq: Every day | ORAL | Status: DC
  Administered 2017-04-15: 0.5 mg via ORAL
  Filled 2017-04-15: qty 1

## 2017-04-15 MED ORDER — TRAZODONE HCL 100 MG PO TABS
100.0000 mg | ORAL_TABLET | Freq: Every day | ORAL | Status: DC
  Administered 2017-04-15 – 2017-04-22 (×8): 100 mg via ORAL
  Filled 2017-04-15 (×8): qty 1

## 2017-04-15 MED ORDER — SODIUM CHLORIDE 0.9 % IV BOLUS (SEPSIS)
500.0000 mL | Freq: Once | INTRAVENOUS | Status: AC
  Administered 2017-04-15: 500 mL via INTRAVENOUS

## 2017-04-15 MED ORDER — ACETAMINOPHEN 325 MG PO TABS: 650.0000 mg | ORAL_TABLET | Freq: Four times a day (QID) | ORAL | Status: DC | PRN

## 2017-04-15 MED ORDER — ACETAMINOPHEN 650 MG RE SUPP: 650.0000 mg | Freq: Four times a day (QID) | RECTAL | Status: DC | PRN

## 2017-04-15 MED ORDER — IPRATROPIUM-ALBUTEROL 0.5-2.5 (3) MG/3ML IN SOLN
3.0000 mL | Freq: Once | RESPIRATORY_TRACT | Status: AC
  Administered 2017-04-15: 3 mL via RESPIRATORY_TRACT
  Filled 2017-04-15: qty 6

## 2017-04-15 MED ORDER — PNEUMOCOCCAL VAC POLYVALENT 25 MCG/0.5ML IJ INJ
0.5000 mL | INJECTION | INTRAMUSCULAR | Status: AC
  Administered 2017-04-16: 0.5 mL via INTRAMUSCULAR
  Filled 2017-04-15: qty 0.5

## 2017-04-15 MED ORDER — ACETAMINOPHEN 500 MG PO TABS
1000.0000 mg | ORAL_TABLET | ORAL | Status: AC
  Administered 2017-04-15: 1000 mg via ORAL
  Filled 2017-04-15: qty 2

## 2017-04-15 MED ORDER — OLANZAPINE 5 MG PO TABS
15.0000 mg | ORAL_TABLET | Freq: Every day | ORAL | Status: DC
  Administered 2017-04-15: 15 mg via ORAL
  Filled 2017-04-15: qty 3

## 2017-04-15 MED ORDER — ONDANSETRON HCL 4 MG PO TABS: 4.0000 mg | ORAL_TABLET | Freq: Four times a day (QID) | ORAL | Status: DC | PRN

## 2017-04-15 MED ORDER — ONDANSETRON HCL 4 MG/2ML IJ SOLN: 4.0000 mg | Freq: Four times a day (QID) | INTRAMUSCULAR | Status: DC | PRN

## 2017-04-15 MED ORDER — PANTOPRAZOLE SODIUM 40 MG PO TBEC
40.0000 mg | DELAYED_RELEASE_TABLET | Freq: Every day | ORAL | Status: DC
  Administered 2017-04-15 – 2017-04-23 (×9): 40 mg via ORAL
  Filled 2017-04-15 (×9): qty 1

## 2017-04-15 MED ORDER — AMLODIPINE BESYLATE 5 MG PO TABS
5.0000 mg | ORAL_TABLET | Freq: Every day | ORAL | Status: DC
  Administered 2017-04-16 – 2017-04-22 (×7): 5 mg via ORAL
  Filled 2017-04-15 (×8): qty 1

## 2017-04-15 MED ORDER — IPRATROPIUM-ALBUTEROL 0.5-2.5 (3) MG/3ML IN SOLN
3.0000 mL | Freq: Four times a day (QID) | RESPIRATORY_TRACT | Status: DC
  Administered 2017-04-15 – 2017-04-20 (×20): 3 mL via RESPIRATORY_TRACT
  Filled 2017-04-15 (×20): qty 3

## 2017-04-15 MED ORDER — FAMOTIDINE 20 MG PO TABS
20.0000 mg | ORAL_TABLET | Freq: Two times a day (BID) | ORAL | Status: DC
  Administered 2017-04-15 – 2017-04-23 (×17): 20 mg via ORAL
  Filled 2017-04-15 (×17): qty 1

## 2017-04-15 NOTE — ED Triage Notes (Signed)
Pt in via [facility] from [place]; name of facility unknown per EMS.  Pt with unwitnessed fall out of bed today.  Pt with cough upon arrival, 88% saturation on room air, placed on 4L nasal cannula to maintain saturation >92%.  Pt A/Ox4, NAD noted at this time.

## 2017-04-15 NOTE — ED Notes (Signed)
Hospitalist to bedside at this time 

## 2017-04-15 NOTE — H&P (Signed)
[facility] NAME: Shelly Freeman    MR#:  [id number]  DATE OF BIRTH:  02-Apr-1951  DATE OF ADMISSION:  04/15/2017  PRIMARY CARE PHYSICIAN: Alene Mires Elyse Jarvis, MD   REQUESTING/REFERRING PHYSICIAN: Delman Kitten  CHIEF COMPLAINT: Shortness of breath   Chief Complaint  Patient presents with  . Cough  . Fall    HISTORY OF PRESENT ILLNESS:  Shelly Freeman  is a 66 y.o. female with a known history of essential hypertension, systemic lupus erythematosus, anxiety came in because of shortness of breath.  She had an unwitnessed fall yesterday at her assisted living facility.  When EMS arrived earlier today noted to have hypoxia with O2 sat 88% on room air.  Right now she is on 2 L and saturation is around 93%.  She has constant cough with phlegm, shortness of breath that is going on for 2 days associated with low-grade temperature.  No extremity edema.  PAST MEDICAL HISTORY:   Past Medical History:  Diagnosis Date  . Arthritis   . Depression   . Heart valve problem    per grandaughter need surgery but they told her may not survive;  [name] Hypertension   . Hypothyroidism   . Lupus   . Neuromuscular disorder ([place])   . Thyroid disease     PAST SURGICAL HISTOIRY:   Past Surgical History:  Procedure Laterality Date  . APPENDECTOMY    . BREAST LUMPECTOMY    . CESAREAN SECTION    . GASTRIC BYPASS    . TUBAL LIGATION      SOCIAL HISTORY:   Social History   Tobacco Use  . Smoking status: Never Smoker  . Smokeless tobacco: Never Used  Substance Use Topics  . Alcohol use: No    FAMILY HISTORY:  No family history on file.  DRUG ALLERGIES:  No Known Allergies  REVIEW OF SYSTEMS:  CONSTITUTIONAL:  low-grade fever, fatigue.   EYES: No blurred or double vision.  EARS, NOSE, AND THROAT: No tinnitus or ear pain.  RESPIRATORY:, Shortness of breath,, cough, low-grade fever for 2 days.[name]  CARDIOVASCULAR: Pleuritic  chest pain because of cough.   GASTROINTESTINAL: No nausea, vomiting, diarrhea or abdominal pain.  GENITOURINARY: No dysuria, hematuria.  ENDOCRINE: No polyuria, nocturia,  HEMATOLOGY: No anemia, easy bruising or bleeding SKIN: No rash or lesion. MUSCULOSKELETAL: No joint pain or arthritis.   NEUROLOGIC: No tingling, numbness, weakness.  PSYCHIATRY: No anxiety or depression.   MEDICATIONS AT HOME:   Prior to Admission medications   Medication Sig Start Date End Date Taking? Authorizing Provider  acetaminophen (TYLENOL) 325 MG tablet Take 650 mg by mouth every 6 (six) hours as needed.   Yes [provider]  albuterol (PROVENTIL HFA;VENTOLIN HFA) 108 (90 Base) MCG/ACT inhaler Inhale 2 puffs into the lungs every 6 (six) hours as needed for wheezing or shortness of breath.   Yes [provider]  alendronate (FOSAMAX) 70 MG tablet Take 70 mg by mouth once a week. Take with a full glass of water on an empty stomach.   Yes [provider]  amLODipine (NORVASC) 5 MG tablet Take 5 mg by mouth daily.   Yes [provider]  b complex vitamins tablet Take 1 tablet by mouth daily.   Yes [provider]  docusate sodium (COLACE) 100 MG capsule Take 100 mg by mouth 2 (two) times daily.   Yes [provider]  DULoxetine (CYMBALTA) 60 MG capsule Take  1 capsule (60 mg total) by mouth daily. Patient taking differently: Take 30 mg by mouth daily.  04/17/16  Yes Pucilowska, Jolanta B, MD  famotidine (PEPCID) 20 MG tablet Take 1 tablet (20 mg total) by mouth 2 (two) times daily. Patient taking differently: Take 20 mg by mouth daily.  04/16/16  Yes Pucilowska, Jolanta B, MD  folic acid (FOLVITE) 1 MG tablet Take 1 mg by mouth daily.   Yes [provider]  gabapentin (NEURONTIN) 800 MG tablet Take 800 mg by mouth 2 (two) times daily.   Yes [provider]  hydroxychloroquine (PLAQUENIL) 200 MG tablet Take 2 tablets (400 mg total) by mouth  daily. Patient taking differently: Take 200 mg by mouth 2 (two) times daily.  04/04/16  Yes Theodoro Grist, MD  ibuprofen (ADVIL,MOTRIN) 600 MG tablet Take 800 mg by mouth every 8 (eight) hours as needed (pain).   Yes [provider]  isosorbide mononitrate (IMDUR) 30 MG 24 hr tablet Take 30 mg by mouth daily.   Yes [provider]  levothyroxine (SYNTHROID, LEVOTHROID) 75 MCG tablet Take 75 mcg by mouth daily before breakfast.   Yes [provider]  LORazepam (ATIVAN) 0.5 MG tablet Take 0.5 mg by mouth at bedtime.   Yes [provider]  metoprolol succinate (TOPROL-XL) 25 MG 24 hr tablet Take 12.5 mg by mouth daily.   Yes [provider]  OLANZapine (ZYPREXA) 15 MG tablet Take 1 tablet (15 mg total) by mouth at bedtime. Patient taking differently: Take 20 mg by mouth at bedtime.  04/16/16  Yes Pucilowska, Jolanta B, MD  pantoprazole (PROTONIX) 40 MG tablet Take 40 mg by mouth daily.   Yes [provider]  traZODone (DESYREL) 100 MG tablet Take 100 mg by mouth at bedtime.   Yes [provider]  ipratropium-albuterol (DUONEB) 0.5-2.5 (3) MG/3ML SOLN Take 3 mLs by nebulization every 6 (six) hours as needed. Patient not taking: Reported on 04/15/2017 04/16/16   Pucilowska, Herma Ard B, MD  temazepam (RESTORIL) 30 MG capsule Take 1 capsule (30 mg total) by mouth at bedtime. Patient not taking: Reported on 04/15/2017 04/16/16   Pucilowska, Wardell Honour, MD  traMADol (ULTRAM) 50 MG tablet Take 1 tablet (50 mg total) by mouth every 6 (six) hours as needed for severe pain. Patient not taking: Reported on 04/15/2017 04/03/16   Theodoro Grist, MD      VITAL SIGNS:  Blood pressure (!) 160/137, pulse 98, temperature (!) 100.4 F (38 C), temperature source Oral, resp. rate (!) 28, height 5\' 1"  (1.549 m), weight 76.7 kg (169 lb), SpO2 97 %.  PHYSICAL EXAMINATION:  GENERAL:  66 y.o.-year-old patient lying in the bed with no acute distress.  EYES:  Pupils equal, round, reactive to light  No scleral icterus. Extraocular muscles intact.  HEENT: Head atraumatic, normocephalic. Oropharynx and nasopharynx clear.  NECK:  Supple, no jugular venous distention. No thyroid enlargement, no tenderness.  LUNGS:.  Wheezing in both lungs. CARDIOVASCULAR: S1, S2 normal. No murmurs, rubs, or gallops.  ABDOMEN: Soft, nontender, nondistended. Bowel sounds present. No organomegaly or mass.  EXTREMITIES: No pedal edema, cyanosis, or clubbing.  NEUROLOGIC: Cranial nerves II through XII are intact. Muscle strength 5/5 in all extremities. Sensation intact. Gait not checked.  PSYCHIATRIC: The patient is alert and oriented x 3.  SKIN: No obvious rash, lesion, or ulcer.   LABORATORY PANEL:   CBC Recent Labs  Lab 04/15/17 0840  WBC 8.7  HGB 9.8*  HCT 30.4*  PLT  192   ------------------------------------------------------------------------------------------------------------------  Chemistries  Recent Labs  Lab 04/15/17 0840  NA 140  K 4.4  CL 107  CO2 20*  GLUCOSE 110*  BUN 18  CREATININE 0.87  CALCIUM 8.5*  AST 56*  ALT 23  ALKPHOS 75  BILITOT 0.5   ------------------------------------------------------------------------------------------------------------------  Cardiac Enzymes Recent Labs  Lab 04/15/17 0840  TROPONINI <0.03   ------------------------------------------------------------------------------------------------------------------  RADIOLOGY:  Dg Chest 2 View  Result Date: 04/15/2017 CLINICAL DATA:  [place] out of bed today landing on the left hip. EXAM: CHEST  2 VIEW COMPARISON:  03/31/2016 FINDINGS: The heart is normal in size and stable. Mild tortuosity of the thoracic aorta. The lungs are clear of an acute process. Artifact from EKG leads are noted. Remote healed right rib fractures are noted. No acute bony findings. IMPRESSION: No acute cardiopulmonary findings. Electronically Signed   By: Marijo Sanes M.D.   On:  04/15/2017 10:34   Dg Pelvis 1-2 Views  Result Date: 04/15/2017 CLINICAL DATA:  Mid to upper left hip and thigh pain following a fall this morning. EXAM: PELVIS - 1-2 VIEW COMPARISON:  None available in [facility] FINDINGS: The bony pelvis is subjectively adequately mineralized. No acute pelvic fracture is observed. There is mild symmetric narrowing of the left hip joint space. The left proximal femur is grossly intact. IMPRESSION: No acute pelvic fracture is observed. Mild degenerative narrowing of the left hip joint space. Electronically Signed   By: David  Martinique M.D.   On: 04/15/2017 10:34   Ct Head Wo Contrast  Result Date: 04/15/2017 CLINICAL DATA:  Unwitnessed fall. Neck pain. Right occipital headache. EXAM: CT HEAD WITHOUT CONTRAST CT CERVICAL SPINE WITHOUT CONTRAST TECHNIQUE: Multidetector CT imaging of the head and cervical spine was performed following the standard protocol without intravenous contrast. Multiplanar CT image reconstructions of the cervical spine were also generated. COMPARISON:  04/15/2016 FINDINGS: CT HEAD FINDINGS Brain: The brainstem, cerebellum, cerebral peduncles, thalami, basal ganglia, basilar cisterns, and ventricular system appear within normal limits. Periventricular white matter and corona radiata hypodensities favor chronic ischemic microvascular white matter disease. No intracranial hemorrhage, mass lesion, or acute CVA. Vascular: There is atherosclerotic calcification of the cavernous carotid arteries bilaterally. Skull: Unremarkable Sinuses/Orbits: Chronic ethmoid, bilateral sphenoid, and right maxillary sinusitis. Other: No supplemental non-categorized findings. CT CERVICAL SPINE FINDINGS Alignment: Unremarkable Skull base and vertebrae: No fracture or acute bony findings. Soft tissues and spinal canal: 7 mm left level II B lymph node is present. Disc levels: Moderate right foraminal stenosis at C6-7 due to facet and intervertebral spurring as shown on images 19 through  20 series 10. Loss of disc height at C6-7 due to degenerative disc disease. Upper chest: Ground-glass density left apical pulmonary nodule measures about 1.7 by 1.0 cm on image 74/7. Other: No supplemental non-categorized findings. IMPRESSION: 1. No acute cervical spine findings or acute intracranial findings. 2. 1.7 by 1.0 cm ground-glass density left apical pulmonary nodule. Quite likely benign but low-grade adenocarcinoma can appear this way. Initial follow-up with CT at 6-12 months is recommended to confirm persistence. If persistent, repeat CT is recommended every 2 years until 5 years of stability has been established. This recommendation follows the consensus statement: Guidelines for Management of Incidental Pulmonary Nodules Detected on CT Images: From the Fleischner Society 2017; Radiology 2017; 284:228-243. 3. Periventricular white matter and corona radiata hypodensities favor chronic ischemic microvascular white matter disease. 4. Chronic paranasal sinusitis. 5. Moderate right foraminal impingement at C6-7 due to spurring. Electronically Signed   By:  Van Clines M.D.   On: 04/15/2017 11:03   Ct Cervical Spine Wo Contrast  Result Date: 04/15/2017 CLINICAL DATA:  Unwitnessed fall. Neck pain. Right occipital headache. EXAM: CT HEAD WITHOUT CONTRAST CT CERVICAL SPINE WITHOUT CONTRAST TECHNIQUE: Multidetector CT imaging of the head and cervical spine was performed following the standard protocol without intravenous contrast. Multiplanar CT image reconstructions of the cervical spine were also generated. COMPARISON:  04/15/2016 FINDINGS: CT HEAD FINDINGS Brain: The brainstem, cerebellum, cerebral peduncles, thalami, basal ganglia, basilar cisterns, and ventricular system appear within normal limits. Periventricular white matter and corona radiata hypodensities favor chronic ischemic microvascular white matter disease. No intracranial hemorrhage, mass lesion, or acute CVA. Vascular: There is  atherosclerotic calcification of the cavernous carotid arteries bilaterally. Skull: Unremarkable Sinuses/Orbits: Chronic ethmoid, bilateral sphenoid, and right maxillary sinusitis. Other: No supplemental non-categorized findings. CT CERVICAL SPINE FINDINGS Alignment: Unremarkable Skull base and vertebrae: No fracture or acute bony findings. Soft tissues and spinal canal: 7 mm left level II B lymph node is present. Disc levels: Moderate right foraminal stenosis at C6-7 due to facet and intervertebral spurring as shown on images 19 through 20 series 10. Loss of disc height at C6-7 due to degenerative disc disease. Upper chest: Ground-glass density left apical pulmonary nodule measures about 1.7 by 1.0 cm on image 74/7. Other: No supplemental non-categorized findings. IMPRESSION: 1. No acute cervical spine findings or acute intracranial findings. 2. 1.7 by 1.0 cm ground-glass density left apical pulmonary nodule. Quite likely benign but low-grade adenocarcinoma can appear this way. Initial follow-up with CT at 6-12 months is recommended to confirm persistence. If persistent, repeat CT is recommended every 2 years until 5 years of stability has been established. This recommendation follows the consensus statement: Guidelines for Management of Incidental Pulmonary Nodules Detected on CT Images: From the Fleischner Society 2017; Radiology 2017; 284:228-243. 3. Periventricular white matter and corona radiata hypodensities favor chronic ischemic microvascular white matter disease. 4. Chronic paranasal sinusitis. 5. Moderate right foraminal impingement at C6-7 due to spurring. Electronically Signed   By: Van Clines M.D.   On: 04/15/2017 11:03   Dg Femur Min 2 Views Left  Result Date: 04/15/2017 CLINICAL DATA:  Patient ports falling this morning and has left hip and thigh pain. EXAM: LEFT FEMUR 2 VIEWS COMPARISON:  AP pelvis of today's date FINDINGS: The femur is adequately mineralized. No acute fracture or  dislocation is observed. There is mild symmetric narrowing of the left hip joint space. There is moderate tricompartmental degenerative joint space loss of the knee with beaking of the tibial spines and articular margins of the patella. There is no joint effusion. There is arterial calcification in the superficial femoral artery. IMPRESSION: There is no acute bony abnormality of the left femur. There is mild degenerative change of the left hip and moderate tricompartmental degenerative change of the left knee. Electronically Signed   By: David  Martinique M.D.   On: 04/15/2017 10:35    EKG:   Orders placed or performed during the hospital encounter of 04/15/17  . EKG 12-Lead  . EKG 12-Lead  . ED EKG 12-Lead  . ED EKG 12-Lead  EKG shows sinus tachycardia 101 bpm, no ST-T changes.  IMPRESSION AND PLAN:   66 year old female patient with history of essential hypertension, SLE, asthma having fever, shortness of breath, cough for the past 2 days and noted to have hypoxia by EMS. 1 acute respiratory failure with hypoxia secondary to acute bronchitis, asthma exacerbation.  Continue oxygen and wean  off oxygen as tolerated, continue bronchodilators, azithromycin, Rocephin . 2.  Asthma exacerbation: Patient will be on bronchodilators. For cough use tussionex 3.  History of lupus: Patient is on Plaquenil: Continue with that.  Patient follows up with Dr. Jefm Bryant. Essential hypertension controlled on amlodipine. 5.  Hypothyroidism:: Continue Synthroid. Depression: She is on Cymbalta, Zyprexa, patient was admitted to [facility] a year ago, patient's son died 13 years ago around 2022-06-10 due to suicide, this is not especially difficult for her because she keeps remembering her son. 6 mild rhabdomyolysis: Fall at assisted living facility yesterday, patient had a CT of the head, CT of cervical spine, chest x-ray, x-ray of, eft hip, pelvis without evidence of fracture ;continue hydration, check CK tomorrow. All the  records are reviewed and case discussed with ED provider. Management plans discussed with the patient, family and they are in agreement.  CODE STATUS: full  TOTAL TIME TAKING CARE OF THIS PATIENT: 55 minutes.    Epifanio Lesches M.D on 04/15/2017 at 11:17 AM  Between 7am to 6pm - Pager - [phone]  After 6pm go to [email or web address] - password [email or web address] [facility]  Office  [phone]  CC: Primary care physician; Theotis Burrow, MD  Note: This dictation was prepared with Dragon dictation along with smaller phrase technology. Any transcriptional errors that result from this process are unintentional.

## 2017-04-15 NOTE — ED Provider Notes (Addendum)
[facility] Emergency Department Provider Note   ____________________________________________   First MD Initiated Contact with Patient 04/15/17 0840     (approximate)  I have reviewed the triage vital signs and the nursing notes.   HISTORY  Chief Complaint Cough and Fall    HPI Shelly Freeman is a 66 y.o. female history of depression, lupus, thyroid disease  Patient presents reports for about 1 week she did not feeling weak, fatigued, had a cough and fevers.  She is not taking any medications.  She reports she has had about 4 falls in the last 1 week due to increased fatigue.  This morning she got up and fell while walking.  Patient reports that she fell onto her buttock is having some tenderness and pain around her buttock and also tenderness over the left upper thigh.  She reports she laid on the floor for some time before EMS was called and assisted her up.  She does feel short of breath throughout the last several days, having fevers.  No nausea or vomiting.  No abdominal pain.  No chest pain.  Reports feeling very fatigued.  She does have a history of "asthma" and uses inhalers at home  Past Medical History:  Diagnosis Date  . Arthritis   . Depression   . Heart valve problem    per grandaughter need surgery but they told her may not survive;  [name] Hypertension   . Hypothyroidism   . Lupus   . Neuromuscular disorder ([place])   . Thyroid disease     Patient Active Problem List   Diagnosis Date Noted  . Major neurocognitive disorder, due to vascular disease, with behavioral disturbance, mild 04/15/2016  . Severe recurrent major depression with psychotic features ([place]) 04/04/2016  . Lupus 04/04/2016  . GERD (gastroesophageal reflux disease) 04/04/2016  . COPD (chronic obstructive pulmonary disease) ([place]) 04/04/2016  . Acute respiratory failure with hypoxia ([place]) 04/03/2016  . Encephalopathy, metabolic 64/40/3474  . Hypokalemia 04/03/2016  . Pain  in limb 04/03/2016  . Hypothyroidism 04/03/2016  . Essential hypertension 04/03/2016  . Intentional opiate overdose ([place]) 03/30/2016    Past Surgical History:  Procedure Laterality Date  . APPENDECTOMY    . BREAST LUMPECTOMY    . CESAREAN SECTION    . GASTRIC BYPASS    . TUBAL LIGATION      Prior to Admission medications   Medication Sig Start Date End Date Taking? Authorizing Provider  acetaminophen (TYLENOL) 325 MG tablet Take 650 mg by mouth every 6 (six) hours as needed.   Yes [provider]  albuterol (PROVENTIL HFA;VENTOLIN HFA) 108 (90 Base) MCG/ACT inhaler Inhale 2 puffs into the lungs every 6 (six) hours as needed for wheezing or shortness of breath.   Yes [provider]  alendronate (FOSAMAX) 70 MG tablet Take 70 mg by mouth once a week. Take with a full glass of water on an empty stomach.   Yes [provider]  amLODipine (NORVASC) 5 MG tablet Take 5 mg by mouth daily.   Yes [provider]  b complex vitamins tablet Take 1 tablet by mouth daily.   Yes [provider]  docusate sodium (COLACE) 100 MG capsule Take 100 mg by mouth 2 (two) times daily.   Yes [provider]  DULoxetine (CYMBALTA) 60 MG capsule Take 1 capsule (60 mg total) by mouth daily. Patient taking differently: Take 30 mg by mouth daily.  04/17/16  Yes Pucilowska, Wardell Honour, MD  famotidine (  PEPCID) 20 MG tablet Take 1 tablet (20 mg total) by mouth 2 (two) times daily. Patient taking differently: Take 20 mg by mouth daily.  04/16/16  Yes Pucilowska, Jolanta B, MD  folic acid (FOLVITE) 1 MG tablet Take 1 mg by mouth daily.   Yes [provider]  gabapentin (NEURONTIN) 800 MG tablet Take 800 mg by mouth 2 (two) times daily.   Yes [provider]  hydroxychloroquine (PLAQUENIL) 200 MG tablet Take 2 tablets (400 mg total) by mouth daily. Patient taking differently: Take 200 mg by mouth 2 (two) times daily.  04/04/16  Yes Theodoro Grist, MD    ibuprofen (ADVIL,MOTRIN) 600 MG tablet Take 800 mg by mouth every 8 (eight) hours as needed (pain).   Yes [provider]  isosorbide mononitrate (IMDUR) 30 MG 24 hr tablet Take 30 mg by mouth daily.   Yes [provider]  levothyroxine (SYNTHROID, LEVOTHROID) 75 MCG tablet Take 75 mcg by mouth daily before breakfast.   Yes [provider]  LORazepam (ATIVAN) 0.5 MG tablet Take 0.5 mg by mouth at bedtime.   Yes [provider]  metoprolol succinate (TOPROL-XL) 25 MG 24 hr tablet Take 12.5 mg by mouth daily.   Yes [provider]  OLANZapine (ZYPREXA) 15 MG tablet Take 1 tablet (15 mg total) by mouth at bedtime. Patient taking differently: Take 20 mg by mouth at bedtime.  04/16/16  Yes Pucilowska, Jolanta B, MD  pantoprazole (PROTONIX) 40 MG tablet Take 40 mg by mouth daily.   Yes [provider]  traZODone (DESYREL) 100 MG tablet Take 100 mg by mouth at bedtime.   Yes [provider]  ipratropium-albuterol (DUONEB) 0.5-2.5 (3) MG/3ML SOLN Take 3 mLs by nebulization every 6 (six) hours as needed. Patient not taking: Reported on 04/15/2017 04/16/16   Pucilowska, Herma Ard B, MD  temazepam (RESTORIL) 30 MG capsule Take 1 capsule (30 mg total) by mouth at bedtime. Patient not taking: Reported on 04/15/2017 04/16/16   Pucilowska, Wardell Honour, MD  traMADol (ULTRAM) 50 MG tablet Take 1 tablet (50 mg total) by mouth every 6 (six) hours as needed for severe pain. Patient not taking: Reported on 04/15/2017 04/03/16   Theodoro Grist, MD    Allergies Patient has no known allergies.  No family history on file.  Social History Social History   Tobacco Use  . Smoking status: Never Smoker  . Smokeless tobacco: Never Used  Substance Use Topics  . Alcohol use: No  . Drug use: No    Review of Systems Constitutional: Fevers chills and fatigue eyes: No visual changes. ENT: No sore throat. Cardiovascular: Denies chest pain. Respiratory: Some  shortness of breath and productive cough Gastrointestinal: No abdominal pain.  No nausea, no vomiting.  No diarrhea.  No constipation. Genitourinary: Negative for dysuria. Musculoskeletal: Negative for back pain.  Pain in the left upper leg feels bruised after falling Skin: Negative for rash. Neurological: Negative for headaches, focal weakness or numbness.  HPI and review of systems are collected Via Spanish interpreter  ____________________________________________   PHYSICAL EXAM:  VITAL SIGNS: ED Triage Vitals  Enc Vitals Group     BP 04/15/17 0838 (!) 151/119     Pulse Rate 04/15/17 0838 (!) 113     Resp 04/15/17 0838 (!) 34     Temp 04/15/17 0838 (!) 100.4 F (38 C)     Temp Source 04/15/17 0838 Oral     SpO2 04/15/17 0838 (!) 88 %  Weight 04/15/17 0839 169 lb (76.7 kg)     Height 04/15/17 0839 5\' 1"  (1.549 m)     Head Circumference --      Peak Flow --      Pain Score 04/15/17 0837 7     Pain Loc --      Pain Edu? --      Excl. in [place]? --     Constitutional: Alert and oriented.  Moderately ill-appearing, appears mildly short of breath with some tachypnea.  Eyes: Conjunctivae are normal. Head: Atraumatic. Nose: No congestion/rhinnorhea. Mouth/Throat: Mucous membranes are slightly dry Neck: No stridor.   Cardiovascular: Normal rate, regular rhythm. Grossly normal heart sounds.  Good peripheral circulation. Respiratory: Normal respiratory effort.  No retractions. Lungs CTAB. Gastrointestinal: Soft and nontender. No distention. Musculoskeletal: No lower extremity tenderness nor edema. Neurologic:  Normal speech and language. No gross focal neurologic deficits are appreciated.  Skin:  Skin is warm, dry and intact. No rash noted. Psychiatric: Mood and affect are normal. Speech and behavior are normal.  ____________________________________________   LABS (all labs ordered are listed, but only abnormal results are displayed)  Labs Reviewed  COMPREHENSIVE METABOLIC  PANEL - Abnormal; Notable for the following components:      Result Value   CO2 20 (*)    Glucose, Bld 110 (*)    Calcium 8.5 (*)    AST 56 (*)    All other components within normal limits  CBC WITH DIFFERENTIAL/PLATELET - Abnormal; Notable for the following components:   Hemoglobin 9.8 (*)    HCT 30.4 (*)    MCV 79.1 (*)    MCH 25.5 (*)    RDW 15.5 (*)    Neutro Abs 7.4 (*)    Lymphs Abs 0.6 (*)    All other components within normal limits  URINALYSIS, ROUTINE W REFLEX MICROSCOPIC - Abnormal; Notable for the following components:   Color, Urine YELLOW (*)    APPearance CLEAR (*)    All other components within normal limits  CK - Abnormal; Notable for the following components:   Total CK 1,418 (*)    All other components within normal limits  MRSA PCR SCREENING  CULTURE, BLOOD (ROUTINE X 2)  CULTURE, BLOOD (ROUTINE X 2)  INFLUENZA PANEL BY PCR (TYPE A & B)  LACTIC ACID, PLASMA  LIPASE, BLOOD  TROPONIN I  PROTIME-INR  HIV ANTIBODY (ROUTINE TESTING)   ____________________________________________  EKG  Reviewed and are by me at 8:40 AM Heart rate 100 QRS 85 QTC 470 Sinus tachycardia, no evidence of acute ischemia or ectopy noted. ____________________________________________  RADIOLOGY  Dg Chest 2 View  Result Date: 04/15/2017 CLINICAL DATA:  [place] out of bed today landing on the left hip. EXAM: CHEST  2 VIEW COMPARISON:  03/31/2016 FINDINGS: The heart is normal in size and stable. Mild tortuosity of the thoracic aorta. The lungs are clear of an acute process. Artifact from EKG leads are noted. Remote healed right rib fractures are noted. No acute bony findings. IMPRESSION: No acute cardiopulmonary findings. Electronically Signed   By: Marijo Sanes M.D.   On: 04/15/2017 10:34   Dg Pelvis 1-2 Views  Result Date: 04/15/2017 CLINICAL DATA:  Mid to upper left hip and thigh pain following a fall this morning. EXAM: PELVIS - 1-2 VIEW COMPARISON:  None available in [facility]  FINDINGS: The bony pelvis is subjectively adequately mineralized. No acute pelvic fracture is observed. There is mild symmetric narrowing of the left hip joint space. The left  proximal femur is grossly intact. IMPRESSION: No acute pelvic fracture is observed. Mild degenerative narrowing of the left hip joint space. Electronically Signed   By: David  Martinique M.D.   On: 04/15/2017 10:34   Ct Head Wo Contrast  Result Date: 04/15/2017 CLINICAL DATA:  Unwitnessed fall. Neck pain. Right occipital headache. EXAM: CT HEAD WITHOUT CONTRAST CT CERVICAL SPINE WITHOUT CONTRAST TECHNIQUE: Multidetector CT imaging of the head and cervical spine was performed following the standard protocol without intravenous contrast. Multiplanar CT image reconstructions of the cervical spine were also generated. COMPARISON:  04/15/2016 FINDINGS: CT HEAD FINDINGS Brain: The brainstem, cerebellum, cerebral peduncles, thalami, basal ganglia, basilar cisterns, and ventricular system appear within normal limits. Periventricular white matter and corona radiata hypodensities favor chronic ischemic microvascular white matter disease. No intracranial hemorrhage, mass lesion, or acute CVA. Vascular: There is atherosclerotic calcification of the cavernous carotid arteries bilaterally. Skull: Unremarkable Sinuses/Orbits: Chronic ethmoid, bilateral sphenoid, and right maxillary sinusitis. Other: No supplemental non-categorized findings. CT CERVICAL SPINE FINDINGS Alignment: Unremarkable Skull base and vertebrae: No fracture or acute bony findings. Soft tissues and spinal canal: 7 mm left level II B lymph node is present. Disc levels: Moderate right foraminal stenosis at C6-7 due to facet and intervertebral spurring as shown on images 19 through 20 series 10. Loss of disc height at C6-7 due to degenerative disc disease. Upper chest: Ground-glass density left apical pulmonary nodule measures about 1.7 by 1.0 cm on image 74/7. Other: No supplemental  non-categorized findings. IMPRESSION: 1. No acute cervical spine findings or acute intracranial findings. 2. 1.7 by 1.0 cm ground-glass density left apical pulmonary nodule. Quite likely benign but low-grade adenocarcinoma can appear this way. Initial follow-up with CT at 6-12 months is recommended to confirm persistence. If persistent, repeat CT is recommended every 2 years until 5 years of stability has been established. This recommendation follows the consensus statement: Guidelines for Management of Incidental Pulmonary Nodules Detected on CT Images: From the Fleischner Society 2017; Radiology 2017; 284:228-243. 3. Periventricular white matter and corona radiata hypodensities favor chronic ischemic microvascular white matter disease. 4. Chronic paranasal sinusitis. 5. Moderate right foraminal impingement at C6-7 due to spurring. Electronically Signed   By: Van Clines M.D.   On: 04/15/2017 11:03   Ct Cervical Spine Wo Contrast  Result Date: 04/15/2017 CLINICAL DATA:  Unwitnessed fall. Neck pain. Right occipital headache. EXAM: CT HEAD WITHOUT CONTRAST CT CERVICAL SPINE WITHOUT CONTRAST TECHNIQUE: Multidetector CT imaging of the head and cervical spine was performed following the standard protocol without intravenous contrast. Multiplanar CT image reconstructions of the cervical spine were also generated. COMPARISON:  04/15/2016 FINDINGS: CT HEAD FINDINGS Brain: The brainstem, cerebellum, cerebral peduncles, thalami, basal ganglia, basilar cisterns, and ventricular system appear within normal limits. Periventricular white matter and corona radiata hypodensities favor chronic ischemic microvascular white matter disease. No intracranial hemorrhage, mass lesion, or acute CVA. Vascular: There is atherosclerotic calcification of the cavernous carotid arteries bilaterally. Skull: Unremarkable Sinuses/Orbits: Chronic ethmoid, bilateral sphenoid, and right maxillary sinusitis. Other: No supplemental  non-categorized findings. CT CERVICAL SPINE FINDINGS Alignment: Unremarkable Skull base and vertebrae: No fracture or acute bony findings. Soft tissues and spinal canal: 7 mm left level II B lymph node is present. Disc levels: Moderate right foraminal stenosis at C6-7 due to facet and intervertebral spurring as shown on images 19 through 20 series 10. Loss of disc height at C6-7 due to degenerative disc disease. Upper chest: Ground-glass density left apical pulmonary nodule measures about 1.7 by 1.0  cm on image 74/7. Other: No supplemental non-categorized findings. IMPRESSION: 1. No acute cervical spine findings or acute intracranial findings. 2. 1.7 by 1.0 cm ground-glass density left apical pulmonary nodule. Quite likely benign but low-grade adenocarcinoma can appear this way. Initial follow-up with CT at 6-12 months is recommended to confirm persistence. If persistent, repeat CT is recommended every 2 years until 5 years of stability has been established. This recommendation follows the consensus statement: Guidelines for Management of Incidental Pulmonary Nodules Detected on CT Images: From the Fleischner Society 2017; Radiology 2017; 284:228-243. 3. Periventricular white matter and corona radiata hypodensities favor chronic ischemic microvascular white matter disease. 4. Chronic paranasal sinusitis. 5. Moderate right foraminal impingement at C6-7 due to spurring. Electronically Signed   By: Van Clines M.D.   On: 04/15/2017 11:03   Dg Femur Min 2 Views Left  Result Date: 04/15/2017 CLINICAL DATA:  Patient ports falling this morning and has left hip and thigh pain. EXAM: LEFT FEMUR 2 VIEWS COMPARISON:  AP pelvis of today's date FINDINGS: The femur is adequately mineralized. No acute fracture or dislocation is observed. There is mild symmetric narrowing of the left hip joint space. There is moderate tricompartmental degenerative joint space loss of the knee with beaking of the tibial spines and  articular margins of the patella. There is no joint effusion. There is arterial calcification in the superficial femoral artery. IMPRESSION: There is no acute bony abnormality of the left femur. There is mild degenerative change of the left hip and moderate tricompartmental degenerative change of the left knee. Electronically Signed   By: David  Martinique M.D.   On: 04/15/2017 10:35   X-rays reviewed, no acute chest findings.  No acute pelvic fractures.  No acute findings involving the left lower femur ____________________________________________   PROCEDURES  Procedure(s) performed: None  Procedures  Critical Care performed: No  ____________________________________________   INITIAL IMPRESSION / ASSESSMENT AND PLAN / ED COURSE  Pertinent labs & imaging results that were available during my care of the patient were reviewed by me and considered in my medical decision making (see chart for details).  Patient presents for evaluation of a fall, she reports that she did minimally strike her head, but has been feeling very fatigued, fell onto her side having discomfort over the left mid femur without contusion bruising or deformity noted.  In addition she has had a recent cough, fever and increased shortness of breath.  She reports several falls over the last several days, and increasing fatigue with symptoms of feeling like she has "a cold".  Clinical Course as of Apr 15 1645  Mon Apr 15, 2017  [id number] D/W pharmacy, awaiting flu rest and further evaluation prior to initiating IV abx  [MQ]    Clinical Course User Index [MQ] Delman Kitten, MD   ----------------------------------------- 10:50 AM on 04/15/2017 ----------------------------------------- Patient reporting some improvement, resting comfortably.   Labs reviewed, CT is negative for traumatic injury.  No evidence of musculoskeletal injury on x-rays and pretest probability for hip fracture very low.  Today able to range the leg well  with no pain on axial loading, no deformity, no shortening.  I do suspect she may have a viral illness, possibly bronchitis, without evidence of pneumonia noted at this time.  However given her history of COPD I will cover her broadly for community-acquired infectious etiologies and COPD exacerbation.  Discussed with the patient, she is agreeable and understanding of plan for admission.   ____________________________________________   FINAL CLINICAL IMPRESSION(S) /  ED DIAGNOSES  Final diagnoses:  Acute respiratory failure with hypoxia (HCC)  COPD exacerbation (HCC)  Solitary pulmonary nodule      NEW MEDICATIONS STARTED DURING THIS VISIT:  This SmartLink is deprecated. Use AVSMEDLIST instead to display the medication list for a patient.   Note:  This document was prepared using Dragon voice recognition software and may include unintentional dictation errors.     Delman Kitten, MD 04/15/17 1646    Delman Kitten, MD 04/15/17 [phone]

## 2017-04-15 NOTE — Progress Notes (Signed)
Pt admitted to 153 from ED around noon. Pt is alert&oriented x4, speaks minimal english. Her granddaughter was present at bedside. Pt reports breathing as improved and is on 2.5L of O2 via [place]. Spanish interpreter used for admission. Skin is intact, and VSS. Pt does report pain with deep inspiration this afternoon, Dr. Vianne Bulls notified via text page-no orders received.   [place], Jerry Caras

## 2017-04-15 NOTE — Progress Notes (Signed)
CODE SEPSIS - PHARMACY COMMUNICATION  **Broad Spectrum Antibiotics should be administered within 1 hour of Sepsis diagnosis**  Time Code Sepsis Called/Page Received: 0910  Antibiotics Ordered: 5427  Time of 1st antibiotic administration: 1104  Additional action taken by pharmacy: spoke to physician who is required to call code sepsis if patient meets SIRS criteria but wants to withhold abx until flu test returns  If necessary, Name of Provider/Nurse Contacted: Dr. Corky Mull, Heloise Beecham ,PharmD Clinical Pharmacist  04/15/2017  9:11 AM

## 2017-04-15 NOTE — ED Notes (Signed)
Patient transported to X-ray 

## 2017-04-15 NOTE — ED Notes (Signed)
CODE  SEPSIS  CALLED  TO  THOMAS  AT  [facility] 

## 2017-04-16 LAB — BASIC METABOLIC PANEL
Anion gap: 7 (ref 5–15)
BUN: 19 mg/dL (ref 6–20)
CO2: 23 mmol/L (ref 22–32)
Calcium: 7.9 mg/dL — ABNORMAL LOW (ref 8.9–10.3)
Chloride: 111 mmol/L (ref 101–111)
Creatinine, Ser: 0.57 mg/dL (ref 0.44–1.00)
GFR calc Af Amer: >60 mL/min (ref >60)
GFR calc non Af Amer: >60 mL/min (ref >60)
Glucose, Bld: 138 mg/dL — ABNORMAL HIGH (ref 65–99)
Potassium: 4 mmol/L (ref 3.5–5.1)
Sodium: 141 mmol/L (ref 135–145)

## 2017-04-16 LAB — CBC
HCT: 27.2 % — ABNORMAL LOW (ref 35.0–47.0)
Hemoglobin: 8.5 g/dL — ABNORMAL LOW (ref 12.0–16.0)
MCH: 24.7 pg — ABNORMAL LOW (ref 26.0–34.0)
MCHC: 31.1 g/dL — ABNORMAL LOW (ref 32.0–36.0)
MCV: 79.6 fL — ABNORMAL LOW (ref 80.0–100.0)
Platelets: 194 10*3/uL (ref 150–440)
RBC: 3.42 MIL/uL — ABNORMAL LOW (ref 3.80–5.20)
RDW: 15.5 % — ABNORMAL HIGH (ref 11.5–14.5)
WBC: 10 10*3/uL (ref 3.6–11.0)

## 2017-04-16 LAB — CK: Total CK: 1601 U/L — ABNORMAL HIGH (ref 38–234)

## 2017-04-16 LAB — GLUCOSE, CAPILLARY: Glucose-Capillary: 113 mg/dL — ABNORMAL HIGH (ref 65–99)

## 2017-04-16 MED ORDER — HYDROXYCHLOROQUINE SULFATE 200 MG PO TABS
200.0000 mg | ORAL_TABLET | Freq: Two times a day (BID) | ORAL | Status: DC
  Administered 2017-04-16 – 2017-04-23 (×15): 200 mg via ORAL
  Filled 2017-04-16 (×15): qty 1

## 2017-04-16 MED ORDER — LEVOTHYROXINE SODIUM 50 MCG PO TABS
75.0000 ug | ORAL_TABLET | Freq: Every day | ORAL | Status: DC
  Administered 2017-04-17 – 2017-04-23 (×7): 75 ug via ORAL
  Filled 2017-04-16 (×7): qty 1

## 2017-04-16 MED ORDER — HYDROCOD POLST-CPM POLST ER 10-8 MG/5ML PO SUER
5.0000 mL | Freq: Two times a day (BID) | ORAL | Status: DC | PRN
  Administered 2017-04-16 – 2017-04-22 (×10): 5 mL via ORAL
  Filled 2017-04-16 (×10): qty 5

## 2017-04-16 MED ORDER — OLANZAPINE 5 MG PO TABS
20.0000 mg | ORAL_TABLET | Freq: Every day | ORAL | Status: DC
  Administered 2017-04-16 – 2017-04-22 (×7): 20 mg via ORAL
  Filled 2017-04-16 (×7): qty 4

## 2017-04-16 MED ORDER — DULOXETINE HCL 30 MG PO CPEP
30.0000 mg | ORAL_CAPSULE | Freq: Every day | ORAL | Status: DC
Start: 2017-04-17 — End: 2017-04-23
  Administered 2017-04-17 – 2017-04-23 (×7): 30 mg via ORAL
  Filled 2017-04-16 (×7): qty 1

## 2017-04-16 MED ORDER — LORAZEPAM 0.5 MG PO TABS: 0.5000 mg | ORAL_TABLET | Freq: Every evening | ORAL | Status: DC | PRN

## 2017-04-16 NOTE — Progress Notes (Signed)
[place] at [place] NAME: Shelly Freeman    MR#:  [id number]  DATE OF BIRTH:  [birth year]  SUBJECTIVE:  CHIEF COMPLAINT:   Chief Complaint  Patient presents with  . Cough  . Fall  Coughing and short of breath, interviewed in the presence of Spanish interpreter REVIEW OF SYSTEMS:  Review of Systems  Constitutional: Negative for chills, fever and weight loss.  HENT: Negative for nosebleeds and sore throat.   Eyes: Negative for blurred vision.  Respiratory: Positive for cough and shortness of breath. Negative for wheezing.   Cardiovascular: Negative for chest pain, orthopnea, leg swelling and PND.  Gastrointestinal: Negative for abdominal pain, constipation, diarrhea, heartburn, nausea and vomiting.  Genitourinary: Negative for dysuria and urgency.  Musculoskeletal: Negative for back pain.  Skin: Negative for rash.  Neurological: Negative for dizziness, speech change, focal weakness and headaches.  Endo/Heme/Allergies: Does not bruise/bleed easily.  Psychiatric/Behavioral: Negative for depression.    DRUG ALLERGIES:  No Known Allergies VITALS:  Blood pressure (!) 122/57, pulse 92, temperature 98.4 F (36.9 C), temperature source Oral, resp. rate 20, height 5\' 1"  (1.549 m), weight 80.3 kg (177 lb), SpO2 92 %. PHYSICAL EXAMINATION:  Physical Exam  Constitutional: She is oriented to person, place, and time and well-developed, well-nourished, and in no distress.  HENT:  Head: Normocephalic and atraumatic.  Eyes: Conjunctivae and EOM are normal. Pupils are equal, round, and reactive to light.  Neck: Normal range of motion. Neck supple. No tracheal deviation present. No thyromegaly present.  Cardiovascular: Normal rate, regular rhythm and normal heart sounds.  Pulmonary/Chest: Effort normal. No respiratory distress. She has decreased breath sounds. She has wheezes. She exhibits no tenderness.  Abdominal: Soft. Bowel sounds are  normal. She exhibits no distension. There is no tenderness.  Musculoskeletal: Normal range of motion.  Neurological: She is alert and oriented to person, place, and time. No cranial nerve deficit.  Skin: Skin is warm and dry. No rash noted.  Psychiatric: Mood and affect normal.   LABORATORY PANEL:  Female CBC Recent Labs  Lab 04/16/17 0326  WBC 10.0  HGB 8.5*  HCT 27.2*  PLT 194   ------------------------------------------------------------------------------------------------------------------ Chemistries  Recent Labs  Lab 04/15/17 0840 04/16/17 0326  NA 140 141  K 4.4 4.0  CL 107 111  CO2 20* 23  GLUCOSE 110* 138*  BUN 18 19  CREATININE 0.87 0.57  CALCIUM 8.5* 7.9*  AST 56*  --   ALT 23  --   ALKPHOS 75  --   BILITOT 0.5  --    RADIOLOGY:  No results found. ASSESSMENT AND PLAN:  66 year old female patient with history of essential hypertension, SLE, asthma having fever, shortness of breath, cough for the past 2 days and noted to have hypoxia by EMS.  1 acute respiratory failure with hypoxia secondary to acute bronchitis, asthma exacerbation.  Continue oxygen and wean off oxygen as tolerated, continue bronchodilators, azithromycin, Rocephin -Will add IV steroids as she is wheezing  2.  Asthma exacerbation: Continue bronchodilators. For cough use tussionex  3.  Acute rhabdomyolysis -Continue aggressive hydration. -increase the rate of 150 cc/hour as CK still going up 1601 now.  - this is status post fall at assisted living facility her CT of the head cervical spine and x-ray of the hip and chest x-ray is negative for any acute fracture  4. Essential hypertension controlled on amlodipine.  5.  Hypothyroidism:: Continue Synthroid.  6. Depression: She is on Cymbalta, Zyprexa, patient was admitted to [facility] a year ago, patient's son died 96 years ago around Jun 14, 2022 due to suicide, this is not especially difficult for her because she keeps remembering her son.  7.    History of lupus: Continue Plaquenil, follows up with Dr. Jefm Bryant.        All the records are reviewed and case discussed with Care Management/Social Worker. Management plans discussed with the patient, nursing and they are in agreement.  CODE STATUS: Full Code  TOTAL TIME TAKING CARE OF THIS PATIENT: 35 minutes.   More than 50% of the time was spent in counseling/coordination of care: YES  POSSIBLE D/C IN 1-2 DAYS, DEPENDING ON CLINICAL CONDITION.   Max Sane M.D on 04/16/2017 at 5:09 PM  Between 7am to 6pm - Pager - [phone]  After 6pm go to [email or web address] - Proofreader  [facility] [place] [facility]  Office  [phone]  CC: Primary care physician; Theotis Burrow, MD  Note: This dictation was prepared with Dragon dictation along with smaller phrase technology. Any transcriptional errors that result from this process are unintentional.

## 2017-04-16 NOTE — Clinical Social Work Note (Signed)
Clinical Social Work Assessment  Patient Details  Name: Shelly Freeman MRN: [id number] Date of Birth: 09/29/1950  Date of referral:  04/16/17               Reason for consult:  Other (Comment Required)(Patient is from [facility]. )                Permission sought to share information with:  Facility Art therapist granted to share information::  Yes, Verbal Permission Granted  Name::        Agency::     Relationship::     Contact Information:     Housing/Transportation Living arrangements for the past 2 months:  [place] of Information:  Patient, Facility, Other (Comment Required)(granddaughter. ) Patient Interpreter Needed:  None Criminal Activity/Legal Involvement Pertinent to Current Situation/Hospitalization:  No - Comment as needed Significant Relationships:  Other Family Members Lives with:  Facility Resident Do you feel safe going back to the place where you live?  Yes Need for family participation in patient care:  Yes (Comment)  Care giving concerns:  Patient is a resident at [facility] located at [place]. [name] [address] (fax: [phone]).    Social Worker assessment / plan:  Holiday representative ([place]) reviewed chart and noted that patient is from an ALF. CSW contacted [facility] and spoke to caregiver Joy. Per Joy patient has been at the ALF for over 1 year now and walks independently and sometimes with a walker. Per Joy patient is on room air and does speak and understand some english. Per Joy patient's granddaughter Shelly Freeman is her main contact. Per Joy patient can return to ALF when stable. CSW met with patient alone at bedside and she is agreeable to return to the ALF and asked CSW to call her granddaughter. CSW contacted patient's granddaughter Shelly Freeman and made her aware of above. Per granddaughter patient does not have a HPOA. Granddaughter is agreeable for patient to return to the group home and will will provide  transport. CSW will continue to follow and assist as needed.   Employment status:  Disabled (Comment on whether or not currently receiving Disability), Retired Forensic scientist:  Medicaid In [place] PT Recommendations:  Not assessed at this time Information / Referral to community resources:  Other (Comment Required)(Patient will retrun back to her ALF. )  Patient/Family's Response to care:  Patient and her granddaughter are agreeable for patient to return to the ALF.   Patient/Family's Understanding of and Emotional Response to Diagnosis, Current Treatment, and Prognosis:  Patient and her granddaughter were very pleasant and thanked CSW for assistance.   Emotional Assessment Appearance:  Appears stated age Attitude/Demeanor/Rapport:    Affect (typically observed):  Pleasant Orientation:  Oriented to Self, Oriented to Place, Oriented to  Time, Oriented to Situation Alcohol / Substance use:  Not Applicable Psych involvement (Current and /or in the community):  No (Comment)  Discharge Needs  Concerns to be addressed:  Discharge Planning Concerns Readmission within the last 30 days:  No Current discharge risk:  Chronically ill Barriers to Discharge:  Continued Medical Work up   [employer], Veronia Beets, LCSW 04/16/2017, 2:01 PM

## 2017-04-16 NOTE — NC FL2 (Signed)
[place] LEVEL OF CARE SCREENING TOOL     IDENTIFICATION  Patient Name: Shelly Freeman Birthdate: 1950-05-11 Sex: female Admission Date (Current Location): 04/15/2017  [place] and [place] Number:  Engineering geologist and Address:  [facility], [address] [place], [place] [address]      Provider Number: [id number]  Attending Physician Name and Address:  Max Sane, MD  Relative Name and Phone Number:       Current Level of Care: Hospital Recommended Level of Care: [place] Prior Approval Number:    Date Approved/Denied:   PASRR Number: ([id number] )  Discharge Plan: Domiciliary (Rest home)    Current Diagnoses: Patient Active Problem List   Diagnosis Date Noted  . Major neurocognitive disorder, due to vascular disease, with behavioral disturbance, mild 04/15/2016  . Severe recurrent major depression with psychotic features ([place]) 04/04/2016  . Lupus 04/04/2016  . GERD (gastroesophageal reflux disease) 04/04/2016  . COPD (chronic obstructive pulmonary disease) ([place]) 04/04/2016  . Acute respiratory failure with hypoxia ([place]) 04/03/2016  . Encephalopathy, metabolic 38/02/1750  . Hypokalemia 04/03/2016  . Pain in limb 04/03/2016  . Hypothyroidism 04/03/2016  . Essential hypertension 04/03/2016  . Intentional opiate overdose ([place]) 03/30/2016    Orientation RESPIRATION BLADDER Height & Weight     Self, Time, Situation, Place  Normal Continent Weight: 177 lb (80.3 kg) Height:  5\' 1"  (154.9 cm)  BEHAVIORAL SYMPTOMS/MOOD NEUROLOGICAL BOWEL NUTRITION STATUS      Continent Diet([place]: Heart Healthy )  AMBULATORY STATUS COMMUNICATION OF NEEDS Skin   Supervision Verbally Normal                       Personal Care Assistance Level of Assistance  Bathing, Feeding, Dressing Bathing Assistance: Limited assistance Feeding assistance: Independent Dressing Assistance: Limited assistance     Functional  Limitations Info  Sight, Hearing, Speech Sight Info: Adequate Hearing Info: Adequate Speech Info: Adequate    SPECIAL CARE FACTORS FREQUENCY  PT (By licensed PT)     PT Frequency: (2-3 home health. )              Contractures      Additional Factors Info  Code Status, Allergies Code Status Info: (Full Code. ) Allergies Info: (No Known Allergies. )           Current Medications (04/16/2017):  This is the current hospital active medication list Current Facility-Administered Medications  Medication Dose Route Frequency Provider Last Rate Last Dose  . 0.9 %  sodium chloride infusion   Intravenous Continuous Max Sane, MD 150 mL/hr at 04/16/17 1357    . acetaminophen (TYLENOL) tablet 650 mg  650 mg Oral Q6H PRN Epifanio Lesches, MD       Or  . acetaminophen (TYLENOL) suppository 650 mg  650 mg Rectal Q6H PRN Epifanio Lesches, MD      . amLODipine (NORVASC) tablet 5 mg  5 mg Oral Daily Epifanio Lesches, MD   5 mg at 04/16/17 1057  . azithromycin (ZITHROMAX) tablet 250 mg  250 mg Oral Daily Epifanio Lesches, MD   250 mg at 04/16/17 1056  . B-complex with vitamin C tablet 1 tablet  1 tablet Oral Daily Epifanio Lesches, MD   1 tablet at 04/16/17 1057  . bisacodyl (DULCOLAX) EC tablet 5 mg  5 mg Oral Daily PRN Epifanio Lesches, MD      . chlorpheniramine-HYDROcodone (TUSSIONEX) 10-8 MG/5ML suspension 5 mL  5 mL Oral Q12H  Epifanio Lesches, MD   5 mL at 04/16/17 1058  . docusate sodium (COLACE) capsule 100 mg  100 mg Oral BID Epifanio Lesches, MD   100 mg at 04/16/17 1057  . DULoxetine (CYMBALTA) DR capsule 60 mg  60 mg Oral Daily Epifanio Lesches, MD   60 mg at 04/16/17 1057  . enoxaparin (LOVENOX) injection 40 mg  40 mg Subcutaneous Q24H Epifanio Lesches, MD   40 mg at 04/15/17 2124  . famotidine (PEPCID) tablet 20 mg  20 mg Oral BID Epifanio Lesches, MD   20 mg at 04/16/17 1056  . folic acid (FOLVITE) tablet 1 mg  1 mg Oral Daily  Epifanio Lesches, MD   1 mg at 04/16/17 1055  . gabapentin (NEURONTIN) capsule 800 mg  800 mg Oral BID Epifanio Lesches, MD   800 mg at 04/16/17 1056  . hydroxychloroquine (PLAQUENIL) tablet 200 mg  200 mg Oral BID Max Sane, MD   200 mg at 04/16/17 1056  . ipratropium-albuterol (DUONEB) 0.5-2.5 (3) MG/3ML nebulizer solution 3 mL  3 mL Nebulization Q6H Epifanio Lesches, MD   3 mL at 04/16/17 1338  . isosorbide mononitrate (IMDUR) 24 hr tablet 30 mg  30 mg Oral Daily Epifanio Lesches, MD   30 mg at 04/16/17 1056  . LORazepam (ATIVAN) tablet 0.5 mg  0.5 mg Oral QHS Epifanio Lesches, MD   0.5 mg at 04/15/17 2123  . MEDLINE mouth rinse  15 mL Mouth Rinse BID Epifanio Lesches, MD   15 mL at 04/16/17 1122  . methylPREDNISolone sodium succinate (SOLU-MEDROL) 125 mg/2 mL injection 60 mg  60 mg Intravenous Q24H Epifanio Lesches, MD   60 mg at 04/16/17 1059  . metoprolol succinate (TOPROL-XL) 24 hr tablet 12.5 mg  12.5 mg Oral Daily Epifanio Lesches, MD   12.5 mg at 04/16/17 1058  . OLANZapine (ZYPREXA) tablet 15 mg  15 mg Oral QHS Epifanio Lesches, MD   15 mg at 04/15/17 2123  . ondansetron (ZOFRAN) tablet 4 mg  4 mg Oral Q6H PRN Epifanio Lesches, MD       Or  . ondansetron (ZOFRAN) injection 4 mg  4 mg Intravenous Q6H PRN Epifanio Lesches, MD      . pantoprazole (PROTONIX) EC tablet 40 mg  40 mg Oral Daily Epifanio Lesches, MD   40 mg at 04/16/17 1057  . traMADol (ULTRAM) tablet 50 mg  50 mg Oral Q6H PRN Epifanio Lesches, MD   50 mg at 04/16/17 1211  . traZODone (DESYREL) tablet 100 mg  100 mg Oral QHS Epifanio Lesches, MD   100 mg at 04/15/17 2123     Discharge Medications: Please see discharge summary for a list of discharge medications.  Relevant Imaging Results:  Relevant Lab Results:   Additional Information (SSN: [id number]. )  [address], Veronia Beets, LCSW

## 2017-04-17 ENCOUNTER — Inpatient Hospital Stay: Payer: Medicaid Other

## 2017-04-17 LAB — BASIC METABOLIC PANEL
Anion gap: 5 (ref 5–15)
BUN: 13 mg/dL (ref 6–20)
CO2: 26 mmol/L (ref 22–32)
Calcium: 8.3 mg/dL — ABNORMAL LOW (ref 8.9–10.3)
Chloride: 110 mmol/L (ref 101–111)
Creatinine, Ser: 0.48 mg/dL (ref 0.44–1.00)
GFR calc Af Amer: >60 mL/min (ref >60)
GFR calc non Af Amer: >60 mL/min (ref >60)
Glucose, Bld: 93 mg/dL (ref 65–99)
Potassium: 4.2 mmol/L (ref 3.5–5.1)
Sodium: 141 mmol/L (ref 135–145)

## 2017-04-17 LAB — CK: Total CK: 681 U/L — ABNORMAL HIGH (ref 38–234)

## 2017-04-17 LAB — CBC
HCT: 27 % — ABNORMAL LOW (ref 35.0–47.0)
Hemoglobin: 8.5 g/dL — ABNORMAL LOW (ref 12.0–16.0)
MCH: 24.9 pg — ABNORMAL LOW (ref 26.0–34.0)
MCHC: 31.5 g/dL — ABNORMAL LOW (ref 32.0–36.0)
MCV: 78.9 fL — ABNORMAL LOW (ref 80.0–100.0)
Platelets: 196 10*3/uL (ref 150–440)
RBC: 3.42 MIL/uL — ABNORMAL LOW (ref 3.80–5.20)
RDW: 15.9 % — ABNORMAL HIGH (ref 11.5–14.5)
WBC: 9 10*3/uL (ref 3.6–11.0)

## 2017-04-17 LAB — GLUCOSE, CAPILLARY: Glucose-Capillary: 86 mg/dL (ref 65–99)

## 2017-04-17 LAB — HIV ANTIBODY (ROUTINE TESTING W REFLEX): HIV Screen 4th Generation wRfx: NONREACTIVE

## 2017-04-17 MED ORDER — GUAIFENESIN ER 600 MG PO TB12
600.0000 mg | ORAL_TABLET | Freq: Two times a day (BID) | ORAL | Status: DC
  Administered 2017-04-17 – 2017-04-23 (×13): 600 mg via ORAL
  Filled 2017-04-17 (×13): qty 1

## 2017-04-17 NOTE — Progress Notes (Signed)
[place] at [place] NAME: Shelly Freeman    MR#:  [id number]  DATE OF BIRTH:  05-20-1950  SUBJECTIVE: Cough, shortness of breath, body pains.  CHIEF COMPLAINT:   Chief Complaint  Patient presents with  . Cough  . Fall  used [place] interpreter. REVIEW OF SYSTEMS:  Review of Systems  Constitutional: Negative for chills, fever and weight loss.  HENT: Negative for nosebleeds and sore throat.   Eyes: Negative for blurred vision.  Respiratory: Positive for cough and shortness of breath. Negative for wheezing.   Cardiovascular: Negative for chest pain, orthopnea, leg swelling and PND.  Gastrointestinal: Negative for abdominal pain, constipation, diarrhea, heartburn, nausea and vomiting.  Genitourinary: Negative for dysuria and urgency.  Musculoskeletal: Negative for back pain.  Skin: Negative for rash.  Neurological: Negative for dizziness, speech change, focal weakness and headaches.  Endo/Heme/Allergies: Does not bruise/bleed easily.  Psychiatric/Behavioral: Negative for depression.    DRUG ALLERGIES:  No Known Allergies VITALS:  Blood pressure (!) 137/57, pulse 98, temperature 98.9 F (37.2 C), temperature source Oral, resp. rate 18, height 5\' 1"  (1.549 m), weight 81.6 kg (180 lb), SpO2 93 %. PHYSICAL EXAMINATION:  Physical Exam  Constitutional: She is oriented to person, place, and time and well-developed, well-nourished, and in no distress.  HENT:  Head: Normocephalic and atraumatic.  Eyes: Conjunctivae and EOM are normal. Pupils are equal, round, and reactive to light.  Neck: Normal range of motion. Neck supple. No tracheal deviation present. No thyromegaly present.  Cardiovascular: Normal rate, regular rhythm and normal heart sounds.  Pulmonary/Chest: Effort normal. No respiratory distress. She has decreased breath sounds. She has wheezes. She exhibits no tenderness.  Abdominal: Soft. Bowel sounds are normal. She  exhibits no distension. There is no tenderness.  Musculoskeletal: Normal range of motion.  Neurological: She is alert and oriented to person, place, and time. No cranial nerve deficit.  Skin: Skin is warm and dry. No rash noted.  Psychiatric: Mood and affect normal.   LABORATORY PANEL:  Female CBC Recent Labs  Lab 04/17/17 0523  WBC 9.0  HGB 8.5*  HCT 27.0*  PLT 196   ------------------------------------------------------------------------------------------------------------------ Chemistries  Recent Labs  Lab 04/15/17 0840  04/17/17 0523  NA 140   < > 141  K 4.4   < > 4.2  CL 107   < > 110  CO2 20*   < > 26  GLUCOSE 110*   < > 93  BUN 18   < > 13  CREATININE 0.87   < > 0.48  CALCIUM 8.5*   < > 8.3*  AST 56*  --   --   ALT 23  --   --   ALKPHOS 75  --   --   BILITOT 0.5  --   --    < > = values in this interval not displayed.   RADIOLOGY:  No results found. ASSESSMENT AND PLAN:  66 year old female patient with history of essential hypertension, SLE, asthma having fever, shortness of breath, cough for the past 2 days and noted to have hypoxia by EMS.  1. acute respiratory failure with hypoxia secondary to acute bronchitis, asthma exacerbation.    Has significant wheezing, cough, unable to get the phlegm out.  Continue bronchodilators, wean off oxygen, O2 saturation 94% on 1 L.  But she is getting severe shortness of breath even to minimal ambulation.   2.  Asthma exacerbation: Continue  bronchodilators.  continue IV steroids. For cough use tussionex  3.  Acute rhabdomyolysis; CK is coming down, decrease IV fluids, check x-ray of the chest again today because patient has coarse breath sounds and wheezing. -.  - this is status post fall at assisted living facility her CT of the head cervical spine and x-ray of the hip and chest x-ray is negative for any acute fracture; physical therapy consulted because patient is having severe dyspnea even with minimal ambulation and  deconditioned.  4. Essential hypertension controlled on amlodipine.  5.  Hypothyroidism:: Continue Synthroid.  6. Depression: She is on Cymbalta, Zyprexa, patient was admitted to [facility] a year ago, patient's son died 56 years ago around 2022-06-24 due to suicide, this is not especially difficult for her because she keeps remembering her son.  7.   History of lupus: Continue Plaquenil, follows up with Dr. Jefm Bryant.        All the records are reviewed and case discussed with Care Management/Social Worker. Management plans discussed with the patient, nursing and they are in agreement.  CODE STATUS: Full Code  TOTAL TIME TAKING CARE OF THIS PATIENT: 35 minutes.   More than 50% of the time was spent in counseling/coordination of care: YES  POSSIBLE D/C IN 1-2 DAYS, DEPENDING ON CLINICAL CONDITION.   Epifanio Lesches M.D on 04/17/2017 at 9:06 AM  Between 7am to 6pm - Pager - [phone]  After 6pm go to [email or web address] - Proofreader  [facility] [place] [facility]  Office  [phone]  CC: Primary care physician; Theotis Burrow, MD  Note: This dictation was prepared with Dragon dictation along with smaller phrase technology. Any transcriptional errors that result from this process are unintentional.

## 2017-04-18 ENCOUNTER — Encounter: Payer: Self-pay | Admitting: Radiology

## 2017-04-18 ENCOUNTER — Inpatient Hospital Stay: Payer: Medicaid Other

## 2017-04-18 DIAGNOSIS — J9601 Acute respiratory failure with hypoxia: Secondary | ICD-10-CM

## 2017-04-18 DIAGNOSIS — J45901 Unspecified asthma with (acute) exacerbation: Secondary | ICD-10-CM

## 2017-04-18 DIAGNOSIS — M329 Systemic lupus erythematosus, unspecified: Secondary | ICD-10-CM

## 2017-04-18 DIAGNOSIS — R0602 Shortness of breath: Secondary | ICD-10-CM

## 2017-04-18 LAB — GLUCOSE, CAPILLARY: Glucose-Capillary: 79 mg/dL (ref 65–99)

## 2017-04-18 MED ORDER — MOMETASONE FURO-FORMOTEROL FUM 100-5 MCG/ACT IN AERO
2.0000 | INHALATION_SPRAY | Freq: Two times a day (BID) | RESPIRATORY_TRACT | Status: DC
  Administered 2017-04-19 – 2017-04-23 (×9): 2 via RESPIRATORY_TRACT
  Filled 2017-04-18: qty 8.8

## 2017-04-18 MED ORDER — IOPAMIDOL (ISOVUE-300) INJECTION 61%
75.0000 mL | Freq: Once | INTRAVENOUS | Status: AC | PRN
  Administered 2017-04-18: 75 mL via INTRAVENOUS

## 2017-04-18 MED ORDER — TIOTROPIUM BROMIDE MONOHYDRATE 18 MCG IN CAPS
18.0000 ug | ORAL_CAPSULE | Freq: Every day | RESPIRATORY_TRACT | Status: DC
  Administered 2017-04-18 – 2017-04-23 (×6): 18 ug via RESPIRATORY_TRACT
  Filled 2017-04-18 (×2): qty 5

## 2017-04-18 MED ORDER — METHYLPREDNISOLONE SODIUM SUCC 125 MG IJ SOLR
60.0000 mg | INTRAMUSCULAR | Status: DC
  Administered 2017-04-18 – 2017-04-21 (×15): 60 mg via INTRAVENOUS
  Filled 2017-04-18 (×15): qty 2

## 2017-04-18 MED ORDER — BUDESONIDE 0.25 MG/2ML IN SUSP: 0.2500 mg | Freq: Two times a day (BID) | RESPIRATORY_TRACT | Status: DC

## 2017-04-18 NOTE — Progress Notes (Signed)
[place] [facility] at [facility] [facility] NAME: Shelly Freeman    MR#:  [id number]  DATE OF BIRTH:  [birth year]  SUBJECTIVE: Has cough, shortness of breath, wheezing.  Chest x-ray yesterday showed no pneumonia.    CHIEF COMPLAINT:   Chief Complaint  Patient presents with  . Cough  . Fall  used [place] interpreter. REVIEW OF SYSTEMS:  Review of Systems  Constitutional: Negative for chills, fever and weight loss.  HENT: Negative for nosebleeds and sore throat.   Eyes: Negative for blurred vision.  Respiratory: Positive for cough and shortness of breath. Negative for wheezing.   Cardiovascular: Negative for chest pain, orthopnea, leg swelling and PND.  Gastrointestinal: Negative for abdominal pain, constipation, diarrhea, heartburn, nausea and vomiting.  Genitourinary: Negative for dysuria and urgency.  Musculoskeletal: Negative for back pain.  Skin: Negative for rash.  Neurological: Negative for dizziness, speech change, focal weakness and headaches.  Endo/Heme/Allergies: Does not bruise/bleed easily.  Psychiatric/Behavioral: Negative for depression.    DRUG ALLERGIES:  No Known Allergies VITALS:  Blood pressure 134/73, pulse 82, temperature 98.6 F (37 C), temperature source Oral, resp. rate 16, height 5\' 1"  (1.549 m), weight 82.6 kg (182 lb), SpO2 93 %. PHYSICAL EXAMINATION:  Physical Exam  Constitutional: She is oriented to person, place, and time and well-developed, well-nourished, and in no distress.  HENT:  Head: Normocephalic and atraumatic.  Eyes: Conjunctivae and EOM are normal. Pupils are equal, round, and reactive to light.  Neck: Normal range of motion. Neck supple. No tracheal deviation present. No thyromegaly present.  Cardiovascular: Normal rate, regular rhythm and normal heart sounds.  Pulmonary/Chest: Effort normal. No respiratory distress. She has decreased breath sounds. She has wheezes. She exhibits no tenderness.    Abdominal: Soft. Bowel sounds are normal. She exhibits no distension. There is no tenderness.  Musculoskeletal: Normal range of motion.  Neurological: She is alert and oriented to person, place, and time. No cranial nerve deficit.  Skin: Skin is warm and dry. No rash noted.  Psychiatric: Mood and affect normal.   LABORATORY PANEL:  Female CBC Recent Labs  Lab 04/17/17 0523  WBC 9.0  HGB 8.5*  HCT 27.0*  PLT 196   ------------------------------------------------------------------------------------------------------------------ Chemistries  Recent Labs  Lab 04/15/17 0840  04/17/17 0523  NA 140   < > 141  K 4.4   < > 4.2  CL 107   < > 110  CO2 20*   < > 26  GLUCOSE 110*   < > 93  BUN 18   < > 13  CREATININE 0.87   < > 0.48  CALCIUM 8.5*   < > 8.3*  AST 56*  --   --   ALT 23  --   --   ALKPHOS 75  --   --   BILITOT 0.5  --   --    < > = values in this interval not displayed.   RADIOLOGY:  No results found. ASSESSMENT AND PLAN:  66 year old female patient with history of essential hypertension, SLE, asthma having fever, shortness of breath, cough for the past 2 days and noted to have hypoxia by EMS.  1. acute respiratory failure with hypoxia secondary to acute bronchitis, asthma exacerbation.    Has significant wheezing, cough, unable to get the phlegm out.  Continue bronchodilators, wean off oxygen, O2 saturation 94% on 1 L.  But she is getting severe shortness of breath even to  minimal ambulation.   2.    severe asthma exacerbation: Continue bronchodilators.  Increase dose of IV steroids, patient has significant wheezing.  Chest x-ray yesterday showed no pneumonia so I am not going to order CT of the chest as I told her before because is not needed.  3.  Acute rhabdomyolysis; CK is coming down, the fluids. -.  - this is status post fall at assisted living facility her CT of the head cervical spine and x-ray of the hip and chest x-ray is negative for any acute fracture;  physical therapy consulted because patient is having severe dyspnea even with minimal ambulation and deconditioned.  4. Essential hypertension controlled on amlodipine.  5.  Hypothyroidism:: Continue Synthroid.  6. Depression: She is on Cymbalta, Zyprexa, patient was admitted to [facility] a year ago, patient's son died 17 years ago around 2022/06/19 due to suicide, this is not especially difficult for her because she keeps remembering her son.  7.   History of lupus: Continue Plaquenil, follows up with Dr. Jefm Freeman.        All the records are reviewed and case discussed with Care Management/Social Worker. Management plans discussed with the patient, nursing and they are in agreement.  CODE STATUS: Full Code  TOTAL TIME TAKING CARE OF THIS PATIENT: 35 minutes.   More than 50% of the time was spent in counseling/coordination of care: YES  POSSIBLE D/C IN 1-2 DAYS, DEPENDING ON CLINICAL CONDITION.   Shelly Freeman M.D on 04/18/2017 at 10:32 AM  Between 7am to 6pm - Pager - [phone]  After 6pm go to [email or web address] - Proofreader  [facility] [place] [facility]  Office  [phone]  CC: Primary care physician; Shelly Burrow, MD  Note: This dictation was prepared with Dragon dictation along with smaller phrase technology. Any transcriptional errors that result from this process are unintentional.

## 2017-04-18 NOTE — Consult Note (Signed)
Pulmonary Critical Care  Initial Consult Note  Caroline Longie EYC:[id number] DOB: 16-Sep-1950 DOA: 04/15/2017  Referring physician: Dr Geralyn Flash  Chief Complaint: SOB  HPI: Shelly Freeman is a 66 y.o. female  With prior history of asthma systemic lupus erythematosus anxiety disorder came in to the hospital because of increasing shortness of breath.  Patient is whispering when I was evaluating her.  She did not appear to be in much distress however she states that she was having a great deal of difficulty breathing.  Patient is status that she has been having a cough and some congestion.  She has noted some sputum production she says that it is green and yellow.  Patient also say she had been having some chest pain.  She has been also having wheezing apparently.  She came into the hospital with increase in all of the symptoms.  At this time she has been on steroids and antibiotics.  Has not had much improvement.  The chest x-ray shows some upper lobe predominance of chronic fibrotic changes.  Review of Systems:  Constitutional:  No weight loss, night sweats, Fevers, chills, fatigue.  HEENT:  No headaches, nasal congestion, post nasal drip,  Cardio-vascular:  +chest pain, no Orthopnea, PND, +swelling in lower extremities, anasarca, dizziness, palpitations  GI:  No heartburn, indigestion, abdominal pain, nausea, vomiting, diarrhea  Resp:  +shortness of breath at rest. +productive cough, No coughing up of blood.+wheezing Skin:  no rash or lesions.  Musculoskeletal:  No joint pain or swelling.   Remainder ROS performed and is unremarkable other than noted in HPI  Past Medical History:  Diagnosis Date  . Arthritis   . Depression   . Heart valve problem    per grandaughter need surgery but they told her may not survive;  [name] Hypertension   . Hypothyroidism   . Lupus   . Neuromuscular disorder ([place])   . Thyroid disease    Past Surgical History:  Procedure Laterality Date  .  APPENDECTOMY    . BREAST LUMPECTOMY    . CESAREAN SECTION    . GASTRIC BYPASS    . TUBAL LIGATION     Social History:  reports that  has never smoked. she has never used smokeless tobacco. She reports that she does not drink alcohol or use drugs.  No Known Allergies  No family history on file.  Prior to Admission medications   Medication Sig Start Date End Date Taking? Authorizing Provider  acetaminophen (TYLENOL) 325 MG tablet Take 650 mg by mouth every 6 (six) hours as needed.   Yes [provider]  albuterol (PROVENTIL HFA;VENTOLIN HFA) 108 (90 Base) MCG/ACT inhaler Inhale 2 puffs into the lungs every 6 (six) hours as needed for wheezing or shortness of breath.   Yes [provider]  alendronate (FOSAMAX) 70 MG tablet Take 70 mg by mouth once a week. Take with a full glass of water on an empty stomach.   Yes [provider]  amLODipine (NORVASC) 5 MG tablet Take 5 mg by mouth daily.   Yes [provider]  b complex vitamins tablet Take 1 tablet by mouth daily.   Yes [provider]  docusate sodium (COLACE) 100 MG capsule Take 100 mg by mouth 2 (two) times daily.   Yes [provider]  DULoxetine (CYMBALTA) 60 MG capsule Take 1 capsule (60 mg total) by mouth daily. Patient taking differently: Take 30 mg by mouth daily.  04/17/16  Yes Pucilowska, Wardell Honour, MD  famotidine (  PEPCID) 20 MG tablet Take 1 tablet (20 mg total) by mouth 2 (two) times daily. Patient taking differently: Take 20 mg by mouth daily.  04/16/16  Yes Pucilowska, Jolanta B, MD  folic acid (FOLVITE) 1 MG tablet Take 1 mg by mouth daily.   Yes [provider]  gabapentin (NEURONTIN) 800 MG tablet Take 800 mg by mouth 2 (two) times daily.   Yes [provider]  hydroxychloroquine (PLAQUENIL) 200 MG tablet Take 2 tablets (400 mg total) by mouth daily. Patient taking differently: Take 200 mg by mouth 2 (two) times daily.  04/04/16  Yes Theodoro Grist, MD   ibuprofen (ADVIL,MOTRIN) 600 MG tablet Take 800 mg by mouth every 8 (eight) hours as needed (pain).   Yes [provider]  isosorbide mononitrate (IMDUR) 30 MG 24 hr tablet Take 30 mg by mouth daily.   Yes [provider]  levothyroxine (SYNTHROID, LEVOTHROID) 75 MCG tablet Take 75 mcg by mouth daily before breakfast.   Yes [provider]  LORazepam (ATIVAN) 0.5 MG tablet Take 0.5 mg by mouth at bedtime.   Yes [provider]  metoprolol succinate (TOPROL-XL) 25 MG 24 hr tablet Take 12.5 mg by mouth daily.   Yes [provider]  OLANZapine (ZYPREXA) 15 MG tablet Take 1 tablet (15 mg total) by mouth at bedtime. Patient taking differently: Take 20 mg by mouth at bedtime.  04/16/16  Yes Pucilowska, Jolanta B, MD  pantoprazole (PROTONIX) 40 MG tablet Take 40 mg by mouth daily.   Yes [provider]  traZODone (DESYREL) 100 MG tablet Take 100 mg by mouth at bedtime.   Yes [provider]  ipratropium-albuterol (DUONEB) 0.5-2.5 (3) MG/3ML SOLN Take 3 mLs by nebulization every 6 (six) hours as needed. Patient not taking: Reported on 04/15/2017 04/16/16   Pucilowska, Herma Ard B, MD  temazepam (RESTORIL) 30 MG capsule Take 1 capsule (30 mg total) by mouth at bedtime. Patient not taking: Reported on 04/15/2017 04/16/16   Pucilowska, Wardell Honour, MD  traMADol (ULTRAM) 50 MG tablet Take 1 tablet (50 mg total) by mouth every 6 (six) hours as needed for severe pain. Patient not taking: Reported on 04/15/2017 04/03/16   Theodoro Grist, MD   Physical Exam: Vitals:   04/18/17 0247 04/18/17 0345 04/18/17 0610 04/18/17 0757  BP:  134/73    Pulse:  82    Resp:  16    Temp:  98.6 F (37 C)    TempSrc:  Oral    SpO2: 93% 97%  93%  Weight:   182 lb (82.6 kg)   Height:        Wt Readings from Last 3 Encounters:  04/18/17 182 lb (82.6 kg)  04/03/16 139 lb 2 oz (63.1 kg)  02/02/16 160 lb (72.6 kg)    General:  Appears calm and  comfortable Eyes: PERRL, normal lids, irises & conjunctiva ENT: grossly normal hearing, lips & tongue Neck: no LAD, masses or thyromegaly Cardiovascular: RRR, no m/r/g. No LE edema. Respiratory:   Patient noted to have bilateral rhonchi       Normal respiratory effort. Abdomen: soft, nontender Skin: no rash or induration seen on limited exam Musculoskeletal: grossly normal tone BUE/BLE Psychiatric: grossly normal mood and affect Neurologic: grossly non-focal.          Labs on Admission:  Basic Metabolic Panel: Recent Labs  Lab 04/15/17 0840 04/16/17 0326 04/17/17 0523  NA 140 141 141  K 4.4 4.0 4.2  CL 107 111 110  CO2 20* 23 26  GLUCOSE 110* 138* 93  BUN 18 19 13   CREATININE 0.87 0.57 0.48  CALCIUM 8.5* 7.9* 8.3*   Liver Function Tests: Recent Labs  Lab 04/15/17 0840  AST 56*  ALT 23  ALKPHOS 75  BILITOT 0.5  PROT 6.7  ALBUMIN 3.5   Recent Labs  Lab 04/15/17 0840  LIPASE 17   No results for input(s): AMMONIA in the last 168 hours. CBC: Recent Labs  Lab 04/15/17 0840 04/16/17 0326 04/17/17 0523  WBC 8.7 10.0 9.0  NEUTROABS 7.4*  --   --   HGB 9.8* 8.5* 8.5*  HCT 30.4* 27.2* 27.0*  MCV 79.1* 79.6* 78.9*  PLT 192 194 196   Cardiac Enzymes: Recent Labs  Lab 04/15/17 0840 04/16/17 0326 04/17/17 0523  CKTOTAL 1,418* 1,601* 681*  TROPONINI <0.03  --   --     BNP (last 3 results) No results for input(s): BNP in the last 8760 hours.  ProBNP (last 3 results) No results for input(s): PROBNP in the last 8760 hours.  CBG: Recent Labs  Lab 04/16/17 0749 04/17/17 0808 04/18/17 0805  GLUCAP 113* 86 79    Radiological Exams on Admission: Dg Chest 2 View  Result Date: 04/17/2017 CLINICAL DATA:  Shortness of breath and cough. History of systemic lupus erythematosus EXAM: CHEST  2 VIEW COMPARISON:  April 15, 2017 and March 31, 2016 FINDINGS: There is scarring in each upper lobe. There is no edema or consolidation. The heart size and  pulmonary vascularity are normal. No adenopathy. There old healed rib fractures on the right. IMPRESSION: Upper lobe scarring bilaterally. No edema or consolidation. Cardiac silhouette within normal limits. No evident adenopathy. Electronically Signed   By: Lowella Grip III M.D.   On: 04/17/2017 09:51    EKG: Independently reviewed.  Assessment/Plan Active Problems:   Acute respiratory failure with hypoxia (HCC)   1. Acute on Chronic respiratory failure  she is doing little bit better but she is not quite to her baseline. Would continue with current medications as you are along with oxygen therapy  2. Chronic asthma  patient's was on nebulizers at this time I have ordered dulera for her   will need follow-up as an outpatient for further assessment  3. Lupus  she is following with Rheumatology  I did suggest getting a CT scan of her chest to get a better idea about degree of fibrosis and scarring   Code Status: full code  Family Communication: none  Disposition Plan: home  Time spent: 17min  I have personally obtained a history, examined the patient, evaluated laboratory and imaging results, formulated the assessment and plan and placed orders.  The Patient requires high complexity decision making for assessment and support. Total Time Spent 45min   Raylei Losurdo A Jobe Mutch, MD [facility] Pulmonary Critical Care Medicine Sleep Medicine

## 2017-04-19 MED ORDER — TRAMADOL HCL 50 MG PO TABS
50.0000 mg | ORAL_TABLET | Freq: Four times a day (QID) | ORAL | Status: DC | PRN
  Administered 2017-04-19 – 2017-04-23 (×6): 50 mg via ORAL
  Filled 2017-04-19 (×6): qty 1

## 2017-04-19 NOTE — Care Management (Signed)
From [facility]. Admitted with a fall.  Poor respiratory status today. Remains on acute O2. Made need chronic O2 at discharge. Has chronic respiratory failure and chronic asthma.

## 2017-04-19 NOTE — Progress Notes (Signed)
Clinical Social Worker ([place]) contacted [facility] [phone] and spoke to staff member Jocelyn and made her aware that patient may D/C over the weekend. Per Jocelyn patient can return when stable.   [employer], LCSW [phone]

## 2017-04-19 NOTE — Progress Notes (Signed)
[facility] [place] NAME: Shelly Freeman    MR#:  [id number]  DATE OF BIRTH:  July 06, 1950  SUBJECTIVE: Says she is slightly better than yesterday.  Some cough, shortness of breath, seen by Dr. Humphrey Rolls.  Talked to her through help of Spanish interpreter, we used a Spanish interrupted every day.  CHIEF COMPLAINT:   Chief Complaint  Patient presents with  . Cough  . Fall  used [place] interpreter. REVIEW OF SYSTEMS:  Review of Systems  Constitutional: Negative for chills, fever and weight loss.  HENT: Negative for nosebleeds and sore throat.   Eyes: Negative for blurred vision.  Respiratory: Positive for cough and shortness of breath. Negative for wheezing.   Cardiovascular: Negative for chest pain, orthopnea, leg swelling and PND.  Gastrointestinal: Negative for abdominal pain, constipation, diarrhea, heartburn, nausea and vomiting.  Genitourinary: Negative for dysuria and urgency.  Musculoskeletal: Negative for back pain.  Skin: Negative for rash.  Neurological: Negative for dizziness, speech change, focal weakness and headaches.  Endo/Heme/Allergies: Does not bruise/bleed easily.  Psychiatric/Behavioral: Negative for depression.    DRUG ALLERGIES:  No Known Allergies VITALS:  Blood pressure 137/62, pulse 72, temperature 98.7 F (37.1 C), temperature source Oral, resp. rate 18, height 5\' 1"  (1.549 m), weight 71.7 kg (158 lb), SpO2 95 %. PHYSICAL EXAMINATION:  Physical Exam  Constitutional: She is oriented to person, place, and time and well-developed, well-nourished, and in no distress.  HENT:  Head: Normocephalic and atraumatic.  Eyes: Conjunctivae and EOM are normal. Pupils are equal, round, and reactive to light.  Neck: Normal range of motion. Neck supple. No tracheal deviation present. No thyromegaly present.  Cardiovascular: Normal rate, regular rhythm and normal heart sounds.  Pulmonary/Chest: Effort normal. No  respiratory distress. She has decreased breath sounds. She has wheezes. She exhibits no tenderness.  Abdominal: Soft. Bowel sounds are normal. She exhibits no distension. There is no tenderness.  Musculoskeletal: Normal range of motion.  Neurological: She is alert and oriented to person, place, and time. No cranial nerve deficit.  Skin: Skin is warm and dry. No rash noted.  Psychiatric: Mood and affect normal.   LABORATORY PANEL:  Female CBC Recent Labs  Lab 04/17/17 0523  WBC 9.0  HGB 8.5*  HCT 27.0*  PLT 196   ------------------------------------------------------------------------------------------------------------------ Chemistries  Recent Labs  Lab 04/15/17 0840  04/17/17 0523  NA 140   < > 141  K 4.4   < > 4.2  CL 107   < > 110  CO2 20*   < > 26  GLUCOSE 110*   < > 93  BUN 18   < > 13  CREATININE 0.87   < > 0.48  CALCIUM 8.5*   < > 8.3*  AST 56*  --   --   ALT 23  --   --   ALKPHOS 75  --   --   BILITOT 0.5  --   --    < > = values in this interval not displayed.   RADIOLOGY:  Ct Chest W Contrast  Result Date: 04/18/2017 CLINICAL DATA:  Two-day history of cough, fever, shortness of breath and hypoxia. Current history of asthma, hypertension and lupus. Surgical history includes gastric bypass. EXAM: CT CHEST WITH CONTRAST TECHNIQUE: Multidetector CT imaging of the chest was performed during intravenous contrast administration. CONTRAST:  50mL ISOVUE-300 IOPAMIDOL INJECTION 61% IV. COMPARISON:  No prior CT.  Multiple prior  chest x-rays. FINDINGS: Cardiovascular: Heart size upper normal. Moderate LAD and left circumflex coronary atherosclerosis. Very small pericardial effusion. Mild atherosclerosis involving the thoracic and upper abdominal aorta without evidence of aneurysm. Mediastinum/Nodes: Borderline to mildly enlarged right hilar lymph nodes measuring approximately 2.3 x 1.7 cm. Normal sized mediastinal and axillary lymph nodes. Normal appearing esophagus. Small  hiatal hernia. Visualized thyroid gland atrophic and unremarkable otherwise. Lungs/Pleura: Patchy ground-glass opacities in the upper lobes. Areas of parenchymal scarring bilaterally, associated with bronchiectasis in the left lower lobe. Peripheral subsegmental consolidation involving the posterior and lateral right upper lobe. In the posterior left upper lobe there is a 8 x 12 mm nodule (10 mm mean diameter) on image 39 of series 3. No other nodules are identified. Small bilateral pleural effusions. Upper Abdomen: Post surgical changes related to the prior gastric bypass procedure. Liver incompletely imaged, though there may be mild hepatomegaly. No focal hepatic parenchymal abnormality. Spleen upper normal in size without focal parenchymal abnormality, measuring approximately 11.8 x 5.7 x 9.2 cm. Visualized upper abdomen otherwise unremarkable. Musculoskeletal: Schmorl's node involving the upper endplate of T6. Calcified T10-11 intervertebral disc. Degenerative disc disease and spondylosis T11-12. No acute osseous abnormalities. IMPRESSION: 1. Likely acute pneumonia versus pneumonitis involving the upper lobes bilaterally, superimposed upon what is likely scarring throughout both lungs related to long-standing lupus. Alternatively, these linear opacities in both lungs may represent areas of linear atelectasis. 2. Possible 10 mm nodule involving the posterior left upper lobe. This may represent acinar airspace disease. Follow-up chest CT in 1-2 months after treatment is recommended to be sure that the nodule resolves with treatment. 3. Very small bilateral pleural effusions. 4. Borderline right hilar lymphadenopathy, most likely reactive and related to long-standing lupus. No pathologic lymphadenopathy. 5. Small hiatal hernia. 6. Mild to moderate LAD and left circumflex coronary atherosclerosis. Small pericardial effusion. 7. Possible mild hepatomegaly, though the liver is incompletely imaged. Aortic  Atherosclerosis (ICD10-I70.0). Electronically Signed   By: Evangeline Dakin M.D.   On: 04/18/2017 15:35   ASSESSMENT AND PLAN:  66 year old female patient with history of essential hypertension, SLE, asthma having fever, shortness of breath, cough for the past 2 days and noted to have hypoxia by EMS.  1. acute respiratory failure with hypoxia secondary to acute bronchitis, asthma exacerbation.    Has significant wheezing, cough, unable to get the phlegm out.  Continue bronchodilators, wean off oxygen, O2 saturation 94% on 1 L.  But she is getting severe shortness of breath even to minimal ambulation.   2.    severe asthma exacerbation: Continue bronchodilators.  IncreaseD dose of IV steroids, patient has significant wheezing.  Chest x-ray showed no pneumonia recommended CT chest.  3.  Acute rhabdomyolysis; CK is coming down, the fluids. -.  - tSHEis status post fall at assisted living facility her CT of the head cervical spine and x-ray of the hip and chest x-ray is negative for any acute fracture; physical therapy consulted because patient is having severe dyspnea even with minimal ambulation and deconditioned.  4. Essential hypertension controlled on amlodipine.  5.  Hypothyroidism:: Continue Synthroid.  6. Depression: She is on Cymbalta, Zyprexa, patient was admitted to [facility] a year ago, patient's son died 63 years ago around July 04, 2022 due to suicide, this is not especially difficult for her because she keeps remembering her son.  7.   History of lupus: Continue Plaquenil, follows up with Dr. Jefm Bryant.   Possible discharge over the weekend if not early next week.  All the records are reviewed and case discussed with Care Management/Social Worker. Management plans discussed with the patient, nursing and they are in agreement.  CODE STATUS: Full Code  TOTAL TIME TAKING CARE OF THIS PATIENT: 35 minutes.   More than 50% of the time was spent in counseling/coordination of care:  YES  POSSIBLE D/C IN 1-2 DAYS, DEPENDING ON CLINICAL CONDITION.   Epifanio Lesches M.D on 04/19/2017 at 10:44 AM  Between 7am to 6pm - Pager - [phone]  After 6pm go to [email or web address] - Proofreader  [facility]  Office  [phone]  CC: Primary care physician; Theotis Burrow, MD  Note: This dictation was prepared with Dragon dictation along with smaller phrase technology. Any transcriptional errors that result from this process are unintentional.

## 2017-04-20 DIAGNOSIS — R911 Solitary pulmonary nodule: Secondary | ICD-10-CM

## 2017-04-20 DIAGNOSIS — J441 Chronic obstructive pulmonary disease with (acute) exacerbation: Secondary | ICD-10-CM

## 2017-04-20 LAB — CULTURE, BLOOD (ROUTINE X 2)
Culture: NO GROWTH
Culture: NO GROWTH

## 2017-04-20 LAB — GLUCOSE, CAPILLARY: Glucose-Capillary: 147 mg/dL — ABNORMAL HIGH (ref 65–99)

## 2017-04-20 MED ORDER — IPRATROPIUM-ALBUTEROL 0.5-2.5 (3) MG/3ML IN SOLN
3.0000 mL | Freq: Three times a day (TID) | RESPIRATORY_TRACT | Status: DC
  Administered 2017-04-20 – 2017-04-23 (×9): 3 mL via RESPIRATORY_TRACT
  Filled 2017-04-20 (×9): qty 3

## 2017-04-20 NOTE — Progress Notes (Signed)
Pulmonary Critical Care  Follow up Consult Note   Jacquel Redditt UEA:[id number] DOB: Sep 08, 1950 DOA: 04/15/2017  PCP: Theotis Burrow, MD    HPI: Shelly Freeman is a 66 y.o. female seen for follow up. She is still having SOB noted. CT scan results also noted reveal presence of upper lobe infiltrates primarily   Review of Systems:   ROS performed and is unremarkable other than noted above  Past Medical History:  Diagnosis Date  . Arthritis   . Collagen vascular disease ([place])   . Depression   . Heart valve problem    per grandaughter need surgery but they told her may not survive;  [name] Hypertension   . Hypothyroidism   . Lupus   . Neuromuscular disorder ([place])   . Thyroid disease    Past Surgical History:  Procedure Laterality Date  . APPENDECTOMY    . BREAST LUMPECTOMY    . CESAREAN SECTION    . GASTRIC BYPASS    . TUBAL LIGATION     Social History:  reports that  has never smoked. she has never used smokeless tobacco. She reports that she does not drink alcohol or use drugs.  No Known Allergies  No family history on file.  Prior to Admission medications   Medication Sig Start Date End Date Taking? Authorizing Provider  acetaminophen (TYLENOL) 325 MG tablet Take 650 mg by mouth every 6 (six) hours as needed.   Yes [provider]  albuterol (PROVENTIL HFA;VENTOLIN HFA) 108 (90 Base) MCG/ACT inhaler Inhale 2 puffs into the lungs every 6 (six) hours as needed for wheezing or shortness of breath.   Yes [provider]  alendronate (FOSAMAX) 70 MG tablet Take 70 mg by mouth once a week. Take with a full glass of water on an empty stomach.   Yes [provider]  amLODipine (NORVASC) 5 MG tablet Take 5 mg by mouth daily.   Yes [provider]  b complex vitamins tablet Take 1 tablet by mouth daily.   Yes [provider]  docusate sodium (COLACE) 100 MG capsule Take 100 mg by mouth 2 (two) times daily.   Yes [provider]  DULoxetine (CYMBALTA) 60 MG capsule Take 1 capsule (60 mg total) by mouth daily. Patient taking differently: Take 30 mg by mouth daily.  04/17/16  Yes Pucilowska, Jolanta B, MD  famotidine (PEPCID) 20 MG tablet Take 1 tablet (20 mg total) by mouth 2 (two) times daily. Patient taking differently: Take 20 mg by mouth daily.  04/16/16  Yes Pucilowska, Jolanta B, MD  folic acid (FOLVITE) 1 MG tablet Take 1 mg by mouth daily.   Yes [provider]  gabapentin (NEURONTIN) 800 MG tablet Take 800 mg by mouth 2 (two) times daily.   Yes [provider]  hydroxychloroquine (PLAQUENIL) 200 MG tablet Take 2 tablets (400 mg total) by mouth daily. Patient taking differently: Take 200 mg by mouth 2 (two) times daily.  04/04/16  Yes Theodoro Grist, MD  ibuprofen (ADVIL,MOTRIN) 600 MG tablet Take 800 mg by mouth every 8 (eight) hours as needed (pain).   Yes [provider]  isosorbide mononitrate (IMDUR) 30 MG 24 hr tablet Take 30 mg by mouth daily.   Yes [provider]  levothyroxine (SYNTHROID, LEVOTHROID) 75 MCG tablet Take 75 mcg by mouth daily before breakfast.   Yes [provider]  LORazepam (ATIVAN) 0.5 MG tablet Take 0.5 mg by mouth at bedtime.   Yes [provider]  metoprolol succinate (TOPROL-XL) 25 MG 24 hr tablet Take 12.5 mg by mouth daily.   Yes [provider]  OLANZapine (ZYPREXA) 15 MG tablet Take 1 tablet (15 mg total) by mouth at bedtime. Patient taking differently: Take 20 mg by mouth at bedtime.  04/16/16  Yes Pucilowska, Jolanta B, MD  pantoprazole (PROTONIX) 40 MG tablet Take 40 mg by mouth daily.   Yes [provider]  traZODone (DESYREL) 100 MG tablet Take 100 mg by mouth at bedtime.   Yes [provider]  ipratropium-albuterol (DUONEB) 0.5-2.5 (3) MG/3ML SOLN Take 3 mLs by nebulization every 6 (six) hours as needed. Patient not taking: Reported on 04/15/2017 04/16/16   Pucilowska, Herma Ard B,  MD  temazepam (RESTORIL) 30 MG capsule Take 1 capsule (30 mg total) by mouth at bedtime. Patient not taking: Reported on 04/15/2017 04/16/16   Pucilowska, Wardell Honour, MD  traMADol (ULTRAM) 50 MG tablet Take 1 tablet (50 mg total) by mouth every 6 (six) hours as needed for severe pain. Patient not taking: Reported on 04/15/2017 04/03/16   Theodoro Grist, MD   Physical Exam: Vitals:   04/20/17 0210 04/20/17 0513 04/20/17 0759 04/20/17 0940  BP:  (!) 124/57 (!) 125/56 (!) 128/59  Pulse:  68 82 90  Resp:  18 18   Temp:  97.9 F (36.6 C) 98 F (36.7 C)   TempSrc:  Oral Oral   SpO2: 93% 95% 91%   Weight:      Height:        Wt Readings from Last 3 Encounters:  04/19/17 158 lb (71.7 kg)  04/03/16 139 lb 2 oz (63.1 kg)  02/02/16 160 lb (72.6 kg)    General:  Appears calm and comfortable Eyes: PERRL, normal lids, irises & conjunctiva ENT: grossly normal hearing, lips & tongue Neck: no LAD, masses or thyromegaly Cardiovascular: RRR, no m/r/g. No LE edema. Respiratory: +rhonchiNormal respiratory effort. Abdomen: soft, nontender Skin: no rash or induration seen on limited exam Musculoskeletal: grossly normal tone BUE/BLE Psychiatric: grossly normal mood and affect Neurologic: grossly non-focal.          Labs on Admission:  Basic Metabolic Panel: Recent Labs  Lab 04/15/17 0840 04/16/17 0326 04/17/17 0523  NA 140 141 141  K 4.4 4.0 4.2  CL 107 111 110  CO2 20* 23 26  GLUCOSE 110* 138* 93  BUN 18 19 13   CREATININE 0.87 0.57 0.48  CALCIUM 8.5* 7.9* 8.3*   Liver Function Tests: Recent Labs  Lab 04/15/17 0840  AST 56*  ALT 23  ALKPHOS 75  BILITOT 0.5  PROT 6.7  ALBUMIN 3.5   Recent Labs  Lab 04/15/17 0840  LIPASE 17   No results for input(s): AMMONIA in the last 168 hours. CBC: Recent Labs  Lab 04/15/17 0840 04/16/17 0326 04/17/17 0523  WBC 8.7 10.0 9.0  NEUTROABS 7.4*  --   --   HGB 9.8* 8.5* 8.5*  HCT 30.4* 27.2* 27.0*  MCV 79.1* 79.6* 78.9*  PLT  192 194 196   Cardiac Enzymes: Recent Labs  Lab 04/15/17 0840 04/16/17 0326 04/17/17 0523  CKTOTAL 1,418* 1,601* 681*  TROPONINI <0.03  --   --     BNP (last 3 results) No results for input(s): BNP in the last 8760 hours.  ProBNP (last 3 results) No results for input(s): PROBNP in the last 8760 hours.  CBG: Recent Labs  Lab 04/16/17 0749 04/17/17 0808 04/18/17 0805 04/20/17 0728  GLUCAP 113* 86 79 147*  Radiological Exams on Admission: Ct Chest W Contrast  Result Date: 04/18/2017 CLINICAL DATA:  Two-day history of cough, fever, shortness of breath and hypoxia. Current history of asthma, hypertension and lupus. Surgical history includes gastric bypass. EXAM: CT CHEST WITH CONTRAST TECHNIQUE: Multidetector CT imaging of the chest was performed during intravenous contrast administration. CONTRAST:  84mL ISOVUE-300 IOPAMIDOL INJECTION 61% IV. COMPARISON:  No prior CT.  Multiple prior chest x-rays. FINDINGS: Cardiovascular: Heart size upper normal. Moderate LAD and left circumflex coronary atherosclerosis. Very small pericardial effusion. Mild atherosclerosis involving the thoracic and upper abdominal aorta without evidence of aneurysm. Mediastinum/Nodes: Borderline to mildly enlarged right hilar lymph nodes measuring approximately 2.3 x 1.7 cm. Normal sized mediastinal and axillary lymph nodes. Normal appearing esophagus. Small hiatal hernia. Visualized thyroid gland atrophic and unremarkable otherwise. Lungs/Pleura: Patchy ground-glass opacities in the upper lobes. Areas of parenchymal scarring bilaterally, associated with bronchiectasis in the left lower lobe. Peripheral subsegmental consolidation involving the posterior and lateral right upper lobe. In the posterior left upper lobe there is a 8 x 12 mm nodule (10 mm mean diameter) on image 39 of series 3. No other nodules are identified. Small bilateral pleural effusions. Upper Abdomen: Post surgical changes related to the prior  gastric bypass procedure. Liver incompletely imaged, though there may be mild hepatomegaly. No focal hepatic parenchymal abnormality. Spleen upper normal in size without focal parenchymal abnormality, measuring approximately 11.8 x 5.7 x 9.2 cm. Visualized upper abdomen otherwise unremarkable. Musculoskeletal: Schmorl's node involving the upper endplate of T6. Calcified T10-11 intervertebral disc. Degenerative disc disease and spondylosis T11-12. No acute osseous abnormalities. IMPRESSION: 1. Likely acute pneumonia versus pneumonitis involving the upper lobes bilaterally, superimposed upon what is likely scarring throughout both lungs related to long-standing lupus. Alternatively, these linear opacities in both lungs may represent areas of linear atelectasis. 2. Possible 10 mm nodule involving the posterior left upper lobe. This may represent acinar airspace disease. Follow-up chest CT in 1-2 months after treatment is recommended to be sure that the nodule resolves with treatment. 3. Very small bilateral pleural effusions. 4. Borderline right hilar lymphadenopathy, most likely reactive and related to long-standing lupus. No pathologic lymphadenopathy. 5. Small hiatal hernia. 6. Mild to moderate LAD and left circumflex coronary atherosclerosis. Small pericardial effusion. 7. Possible mild hepatomegaly, though the liver is incompletely imaged. Aortic Atherosclerosis (ICD10-I70.0). Electronically Signed   By: Evangeline Dakin M.D.   On: 04/18/2017 15:35    EKG: Independently reviewed.  Assessment/Plan Active Problems:   Acute respiratory failure with hypoxia (HCC)   1. Upper lobe pneumonia Ct reveals upper lobe infiltrate Would assess need for antibiotics may need to start broad coverage since she is not improving  2. Chronic Obstructive asthma Continue with aggrressive asthma therapy     I have personally obtained a history, examined the patient, evaluated laboratory and imaging results, formulated  the assessment and plan and placed orders.  The Patient requires high complexity decision making for assessment and support.    Allyne Gee, MD [facility] Pulmonary Critical Care Medicine Sleep Medicine

## 2017-04-20 NOTE — Progress Notes (Signed)
[place] at [place] NAME: Shelly Freeman    MR#:  [id number]  DATE OF BIRTH:  1950-06-24  she feels slightly better than yesterday, sitting in the chair.  CHIEF COMPLAINT:   Chief Complaint  Patient presents with  . Cough  . Fall  used [place] interpreter. REVIEW OF SYSTEMS:  Review of Systems  Constitutional: Negative for chills, fever and weight loss.  HENT: Negative for nosebleeds and sore throat.   Eyes: Negative for blurred vision.  Respiratory: Positive for cough. Negative for shortness of breath and wheezing.   Cardiovascular: Negative for chest pain, orthopnea, leg swelling and PND.  Gastrointestinal: Negative for abdominal pain, constipation, diarrhea, heartburn, nausea and vomiting.  Genitourinary: Negative for dysuria and urgency.  Musculoskeletal: Negative for back pain.  Skin: Negative for rash.  Neurological: Negative for dizziness, speech change, focal weakness and headaches.  Endo/Heme/Allergies: Does not bruise/bleed easily.  Psychiatric/Behavioral: Negative for depression.    DRUG ALLERGIES:  No Known Allergies VITALS:  Blood pressure (!) 125/56, pulse 82, temperature 98 F (36.7 C), temperature source Oral, resp. rate 18, height 5\' 1"  (1.549 m), weight 71.7 kg (158 lb), SpO2 91 %. PHYSICAL EXAMINATION:  Physical Exam  Constitutional: She is oriented to person, place, and time and well-developed, well-nourished, and in no distress.  HENT:  Head: Normocephalic and atraumatic.  Eyes: Conjunctivae and EOM are normal. Pupils are equal, round, and reactive to light.  Neck: Normal range of motion. Neck supple. No tracheal deviation present. No thyromegaly present.  Cardiovascular: Normal rate, regular rhythm and normal heart sounds.  Pulmonary/Chest: Effort normal. No respiratory distress. She has decreased breath sounds. She has no wheezes. She exhibits no tenderness.  Abdominal: Soft. Bowel sounds are  normal. She exhibits no distension. There is no tenderness.  Musculoskeletal: Normal range of motion.  Neurological: She is alert and oriented to person, place, and time. No cranial nerve deficit.  Skin: Skin is warm and dry. No rash noted.  Psychiatric: Mood and affect normal.   LABORATORY PANEL:  Female CBC Recent Labs  Lab 04/17/17 0523  WBC 9.0  HGB 8.5*  HCT 27.0*  PLT 196   ------------------------------------------------------------------------------------------------------------------ Chemistries  Recent Labs  Lab 04/15/17 0840  04/17/17 0523  NA 140   < > 141  K 4.4   < > 4.2  CL 107   < > 110  CO2 20*   < > 26  GLUCOSE 110*   < > 93  BUN 18   < > 13  CREATININE 0.87   < > 0.48  CALCIUM 8.5*   < > 8.3*  AST 56*  --   --   ALT 23  --   --   ALKPHOS 75  --   --   BILITOT 0.5  --   --    < > = values in this interval not displayed.   RADIOLOGY:  No results found. ASSESSMENT AND PLAN:  66 year old female patient with history of essential hypertension, SLE, asthma having fever, shortness of breath, cough for the past 2 days before she came and noted to have hypoxia by EMS.  1. acute respiratory failure with hypoxia secondary to acute bronchitis, asthma exacerbation.    S wheezing wheezing today, likely discharge tomorrow-.  Continue present dose of IV steroids, bronchodilators, antibiotics, discharge tomorrow.    - tSHEis status post fall at assisted living facility her CT of the head  cervical spine and x-ray of the hip and chest x-ray is negative for any acute fracture; physical therapy consulted because patient is having severe dyspnea even with minimal ambulation and deconditioned.  4. Essential hypertension controlled on amlodipine.  5.  Hypothyroidism:: Continue Synthroid.  6. Depression: She is on Cymbalta, Zyprexa, patient was admitted to [facility] a year ago, patient's son died 6 years ago around 06/13/22 due to suicide, this is not especially difficult for her  because she keeps remembering her son.  7.   History of lupus: Continue Plaquenil, follows up with Dr. Jefm Bryant.   Possible discharge over the weekend if not early next week.     All the records are reviewed and case discussed with Care Management/Social Worker. Management plans discussed with the patient, nursing and they are in agreement.  CODE STATUS: Full Code  TOTAL TIME TAKING CARE OF THIS PATIENT: 35 minutes.   More than 50% of the time was spent in counseling/coordination of care: YES  POSSIBLE D/C IN 1-2 DAYS, DEPENDING ON CLINICAL CONDITION.   Epifanio Lesches M.D on 04/20/2017 at 8:33 AM  Between 7am to 6pm - Pager - [phone]  After 6pm go to [email or web address] - Proofreader  [facility] [place] [facility]  Office  [phone]  CC: Primary care physician; Theotis Burrow, MD  Note: This dictation was prepared with Dragon dictation along with smaller phrase technology. Any transcriptional errors that result from this process are unintentional.

## 2017-04-21 LAB — GLUCOSE, CAPILLARY
Glucose-Capillary: 116 mg/dL — ABNORMAL HIGH (ref 65–99)
Glucose-Capillary: 77 mg/dL (ref 65–99)
Glucose-Capillary: 92 mg/dL (ref 65–99)

## 2017-04-21 MED ORDER — METHYLPREDNISOLONE SODIUM SUCC 125 MG IJ SOLR
60.0000 mg | Freq: Two times a day (BID) | INTRAMUSCULAR | Status: DC
  Administered 2017-04-22 – 2017-04-23 (×3): 60 mg via INTRAVENOUS
  Filled 2017-04-21 (×3): qty 2

## 2017-04-21 NOTE — Progress Notes (Signed)
[place] at [place] NAME: Shianne Zeiser    MR#:  [id number]  DATE OF BIRTH:  Sep 22, 1950  Patient had a rough night last night with cough.  CHIEF COMPLAINT:   Chief Complaint  Patient presents with  . Cough  . Fall  used [place] interpreter. REVIEW OF SYSTEMS:  Review of Systems  Constitutional: Negative for chills, fever and weight loss.  HENT: Negative for nosebleeds and sore throat.   Eyes: Negative for blurred vision.  Respiratory: Positive for cough. Negative for shortness of breath and wheezing.   Cardiovascular: Negative for chest pain, orthopnea, leg swelling and PND.  Gastrointestinal: Negative for abdominal pain, constipation, diarrhea, heartburn, nausea and vomiting.  Genitourinary: Negative for dysuria and urgency.  Musculoskeletal: Negative for back pain.  Skin: Negative for rash.  Neurological: Negative for dizziness, speech change, focal weakness and headaches.  Endo/Heme/Allergies: Does not bruise/bleed easily.  Psychiatric/Behavioral: Negative for depression.    DRUG ALLERGIES:  No Known Allergies VITALS:  Blood pressure (!) 134/53, pulse 77, temperature 98.2 F (36.8 C), temperature source Oral, resp. rate 18, height 5\' 1"  (1.549 m), weight 71.7 kg (158 lb), SpO2 92 %. PHYSICAL EXAMINATION:  Physical Exam  Constitutional: She is oriented to person, place, and time and well-developed, well-nourished, and in no distress.  HENT:  Head: Normocephalic and atraumatic.  Eyes: Conjunctivae and EOM are normal. Pupils are equal, round, and reactive to light.  Neck: Normal range of motion. Neck supple. No tracheal deviation present. No thyromegaly present.  Cardiovascular: Normal rate, regular rhythm and normal heart sounds.  Pulmonary/Chest: Effort normal. No respiratory distress. She has wheezes in the left upper field. She exhibits no tenderness.  Abdominal: Soft. Bowel sounds are normal. She exhibits no  distension. There is no tenderness.  Musculoskeletal: Normal range of motion.  Neurological: She is alert and oriented to person, place, and time. No cranial nerve deficit.  Skin: Skin is warm and dry. No rash noted.  Psychiatric: Mood and affect normal.   LABORATORY PANEL:  Female CBC Recent Labs  Lab 04/17/17 0523  WBC 9.0  HGB 8.5*  HCT 27.0*  PLT 196   ------------------------------------------------------------------------------------------------------------------ Chemistries  Recent Labs  Lab 04/15/17 0840  04/17/17 0523  NA 140   < > 141  K 4.4   < > 4.2  CL 107   < > 110  CO2 20*   < > 26  GLUCOSE 110*   < > 93  BUN 18   < > 13  CREATININE 0.87   < > 0.48  CALCIUM 8.5*   < > 8.3*  AST 56*  --   --   ALT 23  --   --   ALKPHOS 75  --   --   BILITOT 0.5  --   --    < > = values in this interval not displayed.   RADIOLOGY:  No results found. ASSESSMENT AND PLAN:  66 year old female patient with history of essential hypertension, SLE, asthma having fever, shortness of breath, cough for the past 2 days before she came and noted to have hypoxia by EMS.  1. acute respiratory failure with hypoxia secondary to acute bronchitis, asthma exacerbation.     improving slowly, decrease the dose of IV steroids today, likely discharge tomorrow  backto assisted living.   -2.status post fall at assisted living facility her CT of the head cervical spine and x-ray of the  hip and chest x-ray is negative for any acute fracture; physical therapy consulted because patient is having severe dyspnea even with minimal ambulation and deconditioned.  4. Essential hypertension controlled on amlodipine.  5.  Hypothyroidism:: Continue Synthroid.  6. Depression: She is on Cymbalta, Zyprexa, patient was admitted to [facility] a year ago, patient's son died 76 years ago around June 21, 2022 due to suicide, this is not especially difficult for her because she keeps remembering her son.  7.   History of lupus:  Continue Plaquenil, follows up with Dr. Jefm Bryant.   We will discharge tomorrow.    All the records are reviewed and case discussed with Care Management/Social Worker. Management plans discussed with the patient, nursing and they are in agreement.  CODE STATUS: Full Code  TOTAL TIME TAKING CARE OF THIS PATIENT: 35 minutes.   More than 50% of the time was spent in counseling/coordination of care: YES  POSSIBLE D/C IN 1-2 DAYS, DEPENDING ON CLINICAL CONDITION.   Epifanio Lesches M.D on 04/21/2017 at 10:00 AM  Between 7am to 6pm - Pager - [phone]  After 6pm go to [email or web address] - Proofreader  [facility] [place] [facility]  Office  [phone]  CC: Primary care physician; Theotis Burrow, MD  Note: This dictation was prepared with Dragon dictation along with smaller phrase technology. Any transcriptional errors that result from this process are unintentional.

## 2017-04-22 LAB — GLUCOSE, CAPILLARY: Glucose-Capillary: 88 mg/dL (ref 65–99)

## 2017-04-22 LAB — CREATININE, SERUM
Creatinine, Ser: 0.6 mg/dL (ref 0.44–1.00)
GFR calc Af Amer: >60 mL/min (ref >60)
GFR calc non Af Amer: >60 mL/min (ref >60)

## 2017-04-22 MED ORDER — LEVOFLOXACIN 500 MG PO TABS
750.0000 mg | ORAL_TABLET | Freq: Every day | ORAL | Status: DC
  Administered 2017-04-22 – 2017-04-23 (×2): 750 mg via ORAL
  Filled 2017-04-22 (×2): qty 2

## 2017-04-22 NOTE — Progress Notes (Signed)
[place] at [place] NAME: Shelly Freeman    MR#:  [id number]  DATE OF BIRTH:  25-May-1950    CHIEF COMPLAINT:   Chief Complaint  Patient presents with  . Cough  . Fall   [place] interpreter present  Patient continues to have chest congestion and wheezing.  Shortness of breath is improving.  Afebrile.  Not on oxygen.  Ambulating in the room.  REVIEW OF SYSTEMS:  Review of Systems  Constitutional: Negative for chills, fever and weight loss.  HENT: Negative for nosebleeds and sore throat.   Eyes: Negative for blurred vision.  Respiratory: Positive for cough. Negative for shortness of breath and wheezing.   Cardiovascular: Negative for chest pain, orthopnea, leg swelling and PND.  Gastrointestinal: Negative for abdominal pain, constipation, diarrhea, heartburn, nausea and vomiting.  Genitourinary: Negative for dysuria and urgency.  Musculoskeletal: Negative for back pain.  Skin: Negative for rash.  Neurological: Negative for dizziness, speech change, focal weakness and headaches.  Endo/Heme/Allergies: Does not bruise/bleed easily.  Psychiatric/Behavioral: Negative for depression.    DRUG ALLERGIES:  No Known Allergies VITALS:  Blood pressure (!) 119/50, pulse 81, temperature 98.7 F (37.1 C), temperature source Oral, resp. rate 15, height 5\' 1"  (1.549 m), weight 76.2 kg (168 lb), SpO2 91 %. PHYSICAL EXAMINATION:  Physical Exam  Constitutional: She is oriented to person, place, and time and well-developed, well-nourished, and in no distress.  HENT:  Head: Normocephalic and atraumatic.  Eyes: Conjunctivae and EOM are normal. Pupils are equal, round, and reactive to light.  Neck: Normal range of motion. Neck supple. No tracheal deviation present. No thyromegaly present.  Cardiovascular: Normal rate, regular rhythm and normal heart sounds.  Pulmonary/Chest: Effort normal. No respiratory distress. She has wheezes. She  exhibits no tenderness.  Abdominal: Soft. Bowel sounds are normal. She exhibits no distension. There is no tenderness.  Musculoskeletal: Normal range of motion.  Neurological: She is alert and oriented to person, place, and time. No cranial nerve deficit.  Skin: Skin is warm and dry. No rash noted.  Psychiatric: Mood and affect normal.   LABORATORY PANEL:  Female CBC Recent Labs  Lab 04/17/17 0523  WBC 9.0  HGB 8.5*  HCT 27.0*  PLT 196   ------------------------------------------------------------------------------------------------------------------ Chemistries  Recent Labs  Lab 04/17/17 0523 04/22/17 0502  NA 141  --   K 4.2  --   CL 110  --   CO2 26  --   GLUCOSE 93  --   BUN 13  --   CREATININE 0.48 0.60  CALCIUM 8.3*  --    RADIOLOGY:  No results found. ASSESSMENT AND PLAN:  66 year old female patient with history of essential hypertension, SLE, asthma having fever, shortness of breath, cough for the past 2 days before she came and noted to have hypoxia by EMS.  * Bilateral upper lobe pneumonia with acute hypoxic respiratory failure Start Levaquin.  Wean oxygen.  IV steroids.  Nebulizers. Appreciate pulmonary input.   * .status post fall at assisted living facility her CT of the head cervical spine and x-ray of the hip and chest x-ray is negative for any acute fracture  * Essential hypertension controlled on amlodipine.  * Hypothyroidism:: Continue Synthroid.  * Depression: She is on Cymbalta, Zyprexa, patient was admitted to [facility] a year ago, patient's son died 62 years ago around 10-Jun-2022 due to suicide, this is not especially difficult for her because she keeps  remembering her son.  * History of lupus: Continue Plaquenil, follows up with Dr. Jefm Bryant.  All the records are reviewed and case discussed with Care Management/Social Worker. Management plans discussed with the patient, nursing and they are in agreement.  CODE STATUS: Full Code  TOTAL TIME TAKING  CARE OF THIS PATIENT: 35 minutes.   POSSIBLE D/C IN 1-2 DAYS, DEPENDING ON CLINICAL CONDITION.  Leia Alf Jhayla Podgorski M.D on 04/22/2017 at 2:33 PM  Between 7am to 6pm - Pager - [phone]  After 6pm go to [email or web address] - Proofreader  [facility] [place] [facility]  Office  [phone]  CC: Primary care physician; Theotis Burrow, MD  Note: This dictation was prepared with Dragon dictation along with smaller phrase technology. Any transcriptional errors that result from this process are unintentional.

## 2017-04-22 NOTE — Care Management (Signed)
It is anticipated that patient will discharge tomorrow back to ALF. Will need nursing. Waiting on PT evaluation. Following progression.

## 2017-04-22 NOTE — Progress Notes (Signed)
Per MD patient will D/C back to [facility] tomorrow. Clinical Education officer, museum (CSW) contacted [facility] and spoke to [place] and made her aware of above. CSW also left patient's granddaughter [name] a Advertising account executive.   [employer], LCSW [phone]

## 2017-04-23 LAB — GLUCOSE, CAPILLARY: Glucose-Capillary: 97 mg/dL (ref 65–99)

## 2017-04-23 MED ORDER — LEVOFLOXACIN 750 MG PO TABS: 750.0000 mg | ORAL_TABLET | Freq: Every day | ORAL | 0 refills | Status: DC

## 2017-04-23 MED ORDER — PREDNISONE 10 MG (21) PO TBPK: ORAL_TABLET | ORAL | 0 refills | Status: DC

## 2017-04-23 MED ORDER — IPRATROPIUM-ALBUTEROL 0.5-2.5 (3) MG/3ML IN SOLN: 3.0000 mL | Freq: Four times a day (QID) | RESPIRATORY_TRACT | 0 refills | Status: DC | PRN

## 2017-04-23 NOTE — Progress Notes (Addendum)
Patient is medically stable for D/C back to [facility] today. Per patient her granddaughter [name] gets off work at 3:30 pm and will come pick her up after that. Per [facility] staff patient can return today. Clinical Education officer, museum (CSW) sent FL2 and D/C Summary to ALF. Patient is aware of above. RN will call report. RN case manager arranged equipment and home health nursing. Please reconsult if future social work needs arise. CSW signing off.   CSW confirmed with patient's granddaughter [name] via telephone that she can pick patient up today.   [employer], LCSW [phone]

## 2017-04-23 NOTE — Discharge Instructions (Signed)
Resume diet and activity as before ° ° °

## 2017-04-23 NOTE — NC FL2 (Addendum)
[place] LEVEL OF CARE SCREENING TOOL     IDENTIFICATION  Patient Name: Shelly Freeman Birthdate: 12-25-1950 Sex: female Admission Date (Current Location): 04/15/2017  [place] and [place] Number:  Engineering geologist and Address:  [facility], [address] [place], [place] [address]      Provider Number: [id number]  Attending Physician Name and Address:  Hillary Bow, MD  Relative Name and Phone Number:       Current Level of Care: Hospital Recommended Level of Care: [place] Prior Approval Number:    Date Approved/Denied:   PASRR Number: ([id number] )  Discharge Plan: Domiciliary (Rest home)    Current Diagnoses: Patient Active Problem List   Diagnosis Date Noted  . Major neurocognitive disorder, due to vascular disease, with behavioral disturbance, mild 04/15/2016  . Severe recurrent major depression with psychotic features ([place]) 04/04/2016  . Lupus 04/04/2016  . GERD (gastroesophageal reflux disease) 04/04/2016  . COPD (chronic obstructive pulmonary disease) ([place]) 04/04/2016  . Acute respiratory failure with hypoxia ([place]) 04/03/2016  . Encephalopathy, metabolic 03/47/4259  . Hypokalemia 04/03/2016  . Pain in limb 04/03/2016  . Hypothyroidism 04/03/2016  . Essential hypertension 04/03/2016  . Intentional opiate overdose ([place]) 03/30/2016    Orientation RESPIRATION BLADDER Height & Weight     Self, Time, Situation, Place  Normal Continent Weight: 169 lb (76.7 kg) Height:  5\' 1"  (154.9 cm)  BEHAVIORAL SYMPTOMS/MOOD NEUROLOGICAL BOWEL NUTRITION STATUS      Continent Diet([place]: Heart Healthy )  AMBULATORY STATUS COMMUNICATION OF NEEDS Skin   Supervision Verbally Normal                       Personal Care Assistance Level of Assistance  Bathing, Feeding, Dressing Bathing Assistance: Limited assistance Feeding assistance: Independent Dressing Assistance: Limited assistance     Functional  Limitations Info  Sight, Hearing, Speech Sight Info: Adequate Hearing Info: Adequate Speech Info: Adequate    SPECIAL [place].                    Contractures      Additional Factors Info  Code Status, Allergies Code Status Info: (Full Code. ) Allergies Info: (No Known Allergies. )          Discharge Medications: Please see discharge summary for a list of discharge medications. Medication List             STOP taking these medications            amLODipine 5 MG tablet Commonly known as:  NORVASC    temazepam 30 MG capsule Commonly known as:  RESTORIL    traMADol 50 MG tablet Commonly known as:  ULTRAM                       TAKE these medications            acetaminophen 325 MG tablet Commonly known as:  TYLENOL Take 650 mg by mouth every 6 (six) hours as needed.    albuterol 108 (90 Base) MCG/ACT inhaler Commonly known as:  PROVENTIL HFA;VENTOLIN HFA Inhale 2 puffs into the lungs every 6 (six) hours as needed for wheezing or shortness of breath.    alendronate 70 MG tablet Commonly known as:  FOSAMAX Take 70 mg by mouth once a week. Take with a full glass of water on an empty stomach.    b  complex vitamins tablet Take 1 tablet by mouth daily.    docusate sodium 100 MG capsule Commonly known as:  COLACE Take 100 mg by mouth 2 (two) times daily.    DULoxetine 60 MG capsule Commonly known as:  CYMBALTA Take 1 capsule (60 mg total) by mouth daily. What changed:  how much to take    famotidine 20 MG tablet Commonly known as:  PEPCID Take 1 tablet (20 mg total) by mouth 2 (two) times daily. What changed:  when to take this    folic acid 1 MG tablet Commonly known as:  FOLVITE Take 1 mg by mouth daily.    gabapentin 800 MG tablet Commonly known as:  NEURONTIN Take 800 mg by mouth 2 (two) times daily.    hydroxychloroquine 200 MG tablet Commonly known as:  PLAQUENIL Take 2 tablets (400 mg  total) by mouth daily. What changed:    how much to take  when to take this    ibuprofen 600 MG tablet Commonly known as:  ADVIL,MOTRIN Take 800 mg by mouth every 8 (eight) hours as needed (pain).    ipratropium-albuterol 0.5-2.5 (3) MG/3ML Soln Commonly known as:  DUONEB Take 3 mLs by nebulization every 6 (six) hours as needed (Wheezing). What changed:  reasons to take this    isosorbide mononitrate 30 MG 24 hr tablet Commonly known as:  IMDUR Take 30 mg by mouth daily.    levofloxacin 750 MG tablet Commonly known as:  LEVAQUIN Take 1 tablet (750 mg total) by mouth daily.    levothyroxine 75 MCG tablet Commonly known as:  SYNTHROID, LEVOTHROID Take 75 mcg by mouth daily before breakfast.    LORazepam 0.5 MG tablet Commonly known as:  ATIVAN Take 0.5 mg by mouth at bedtime.    metoprolol succinate 25 MG 24 hr tablet Commonly known as:  TOPROL-XL Take 12.5 mg by mouth daily.    OLANZapine 15 MG tablet Commonly known as:  ZYPREXA Take 1 tablet (15 mg total) by mouth at bedtime. What changed:  how much to take    pantoprazole 40 MG tablet Commonly known as:  PROTONIX Take 40 mg by mouth daily.    predniSONE 10 MG (21) Tbpk tablet Commonly known as:  STERAPRED UNI-PAK 21 TAB 60mg  day 1 and taper 10 mg daily. 6 days    traZODone 100 MG tablet Commonly known as:  DESYREL Take 100 mg by mouth at bedtime.               Relevant Lab Results: Additional Information (SSN: [id number]. )  Shuntay Everetts, Veronia Beets, LCSW

## 2017-04-23 NOTE — Care Management (Signed)
Ordered a rolator from [facility].

## 2017-04-23 NOTE — Evaluation (Signed)
Physical Therapy Evaluation Patient Details Name: Shelly Freeman MRN: [id number] DOB: 13-Nov-1950 Today's Date: 04/23/2017   History of Present Illness  Pt admitted for ARF with hypoxia. Pt complains of cough and fall. Pt reports she falls due to dizzy spells, however they aren't related to change of position or blood pressure per patient. Pt is spanish speaking, used interpreter for evaluation.  Clinical Impression  Pt is a pleasant 66 year old female who was admitted for ARF with hypoxia. Pt on room air for all mobility with no complaints of SOB symptoms. Pt demonstrates all bed mobility/transfers/ambulation at baseline level. Pt reports her main concern is dizzy spells causing her to fall. Recommend rollater as she can sit down on seat and prevent falls. The dizzy spells do not appear PT treatable as they aren't related to BP or position change and occur randomly. Encouraged continued use of AD for all mobility to prevent falls. Pt does not require any further PT needs at this time. Pt will be dc in house and does not require follow up. RN aware. Will dc current orders.      Follow Up Recommendations No PT follow up    Equipment Recommendations  (would benefit from rollater)    Recommendations for Other Services       Precautions / Restrictions Precautions Precautions: Fall Restrictions Weight Bearing Restrictions: No      Mobility  Bed Mobility Overal bed mobility: Independent             General bed mobility comments: safe technique, no dizziness with transition to sitting  Transfers Overall transfer level: Modified independent Equipment used: Rolling walker (2 wheeled)             General transfer comment: upright posture noted with RW. Safe technique performed with pt able to push from seated surface  Ambulation/Gait Ambulation/Gait assistance: Min guard Ambulation Distance (Feet): 80 Feet Assistive device: Rolling walker (2 wheeled) Gait  Pattern/deviations: Step-through pattern     General Gait Details: ambulated 2 laps in room, performing turns safely without dizziness. No LOB noted or SOB symptoms. All mobility performed on room air.  Stairs            Wheelchair Mobility    Modified Rankin (Stroke Patients Only)       Balance Overall balance assessment: Modified Independent                                           Pertinent Vitals/Pain Pain Assessment: No/denies pain    Home Living Family/patient expects to be discharged to:: Assisted living               Home Equipment: Walker - 2 wheels      Prior Function Level of Independence: Independent with assistive device(s)         Comments: ambulates with RW at all times     Hand Dominance        Extremity/Trunk Assessment   Upper Extremity Assessment Upper Extremity Assessment: Overall WFL for tasks assessed    Lower Extremity Assessment Lower Extremity Assessment: Overall WFL for tasks assessed       Communication   Communication: Prefers language other than English;Interpreter utilized  Cognition Arousal/Alertness: Awake/alert Behavior During Therapy: WFL for tasks assessed/performed Overall Cognitive Status: Within Functional Limits for tasks assessed  General Comments      Exercises     Assessment/Plan    PT Assessment Patent does not need any further PT services  PT Problem List         PT Treatment Interventions      PT Goals (Current goals can be found in the Care Plan section)  Acute Rehab PT Goals Patient Stated Goal: to go home PT Goal Formulation: All assessment and education complete, DC therapy Time For Goal Achievement: 04/23/17 Potential to Achieve Goals: Good    Frequency     Barriers to discharge        Co-evaluation               AM-PAC PT "6 Clicks" Daily Activity  Outcome Measure Difficulty turning over  in bed (including adjusting bedclothes, sheets and blankets)?: None Difficulty moving from lying on back to sitting on the side of the bed? : None Difficulty sitting down on and standing up from a chair with arms (e.g., wheelchair, bedside commode, etc,.)?: None Help needed moving to and from a bed to chair (including a wheelchair)?: None Help needed walking in hospital room?: None Help needed climbing 3-5 steps with a railing? : A Little 6 Click Score: 23    End of Session Equipment Utilized During Treatment: Gait belt Activity Tolerance: Patient tolerated treatment well Patient left: in chair;with chair alarm set;with nursing/sitter in room Nurse Communication: Mobility status PT Visit Diagnosis: History of falling (Z91.81)    Time: 8413-2440 PT Time Calculation (min) (ACUTE ONLY): 13 min   Charges:   PT Evaluation $PT Eval Low Complexity: 1 Low     PT G Codes:   PT G-Codes **NOT FOR INPATIENT CLASS** Functional Assessment Tool Used: AM-PAC 6 Clicks Basic Mobility Functional Limitation: Mobility: Walking and moving around Mobility: Walking and Moving Around Current Status (N0272): At least 1 percent but less than 20 percent impaired, limited or restricted Mobility: Walking and Moving Around Goal Status [phone]): At least 1 percent but less than 20 percent impaired, limited or restricted Mobility: Walking and Moving Around Discharge Status [phone]): At least 1 percent but less than 20 percent impaired, limited or restricted    Greggory Stallion, PT, DPT [phone]   Dontrey Snellgrove 04/23/2017, 9:30 AM

## 2017-04-23 NOTE — Care Management Note (Signed)
Case Management Note  Patient Details  Name: Kyara Boxer MRN: [id number] Date of Birth: 02/01/1951  Subjective/Objective:  Discharging today                  Action/Plan: [facility] to provide SN. Ordered DME, neb and Rolator from [facility].   Expected Discharge Date:  04/23/17               Expected Discharge Plan:  [place]  In-House Referral:     Discharge planning Services  CM Consult  Post Acute Care Choice:  Durable Medical Equipment, Home Health Choice offered to:     DME Arranged:  Walker rolling with seat, Nebulizer machine DME Agency:  [facility]:  RN [facility] Agency: [facility]   Status of Service:  Completed, signed off  If discussed at [place] of Stay Meetings, dates discussed:    Additional Comments:  Jolly Mango, RN 04/23/2017, 9:08 AM

## 2017-04-23 NOTE — Discharge Summary (Addendum)
[facility] [place] NAME: Shelly Freeman    MR#:  [id number]  DATE OF BIRTH:  12-30-1950  DATE OF ADMISSION:  04/15/2017 ADMITTING PHYSICIAN: Epifanio Lesches, MD  DATE OF DISCHARGE: No discharge date for patient encounter.  PRIMARY CARE PHYSICIAN: Revelo, Elyse Jarvis, MD   ADMISSION DIAGNOSIS:  Neck pain [M54.2] COPD exacerbation ([place]) [J44.1] Acute respiratory failure with hypoxia ([place]) [J96.01]  DISCHARGE DIAGNOSIS:  Active Problems:   Acute respiratory failure with hypoxia ([place])   SECONDARY DIAGNOSIS:   Past Medical History:  Diagnosis Date  . Arthritis   . Collagen vascular disease ([place])   . Depression   . Heart valve problem    per grandaughter need surgery but they told her may not survive;  [name] Hypertension   . Hypothyroidism   . Lupus   . Neuromuscular disorder ([place])   . Thyroid disease      ADMITTING HISTORY  HISTORY OF PRESENT ILLNESS:  Shelly Freeman  is a 66 y.o. female with a known history of essential hypertension, systemic lupus erythematosus, anxiety came in because of shortness of breath.  She had an unwitnessed fall yesterday at her assisted living facility.  When EMS arrived earlier today noted to have hypoxia with O2 sat 88% on room air.  Right now she is on 2 L and saturation is around 93%.  She has constant cough with phlegm, shortness of breath that is going on for 2 days associated with low-grade temperature.  No extremity edema.  HOSPITAL COURSE:   66 year old female patient with history of essential hypertension, SLE, asthma having fever, shortness of breath, cough for the past 2 days before she came and noted to have hypoxia by EMS.  * Bilateral upper lobe pneumonia with acute hypoxic respiratory failure Started Levaquin. Weaned off oxygen.  IV steroids changed to PO prednisone.  Nebulizers. Dr. Humphrey Rolls with pulmonary saw the patient. Will have her f/u in 1 week.  She still has some  wheezing but feels back to baseline with breathing  Levaquin, prednisone and Duoneb   * Status post fall at assisted living facility her CT of the head cervical spine and x-ray of the hip and chest x-ray is negative for any acute fracture  * Essential hypertension controlled on amlodipine.  *Hypothyroidism:: Continue Synthroid.  * Depression: She is on Cymbalta, Zyprexa.  *History of lupus: Continue Plaquenil, follows up with Dr. Jefm Bryant.  Stable for discharge to ALF with [facility] RN  CONSULTS OBTAINED:    DRUG ALLERGIES:  No Known Allergies  DISCHARGE MEDICATIONS:   Allergies as of 04/23/2017   No Known Allergies     Medication List    STOP taking these medications   amLODipine 5 MG tablet Commonly known as:  NORVASC   temazepam 30 MG capsule Commonly known as:  RESTORIL   traMADol 50 MG tablet Commonly known as:  ULTRAM     TAKE these medications   acetaminophen 325 MG tablet Commonly known as:  TYLENOL Take 650 mg by mouth every 6 (six) hours as needed.   albuterol 108 (90 Base) MCG/ACT inhaler Commonly known as:  PROVENTIL HFA;VENTOLIN HFA Inhale 2 puffs into the lungs every 6 (six) hours as needed for wheezing or shortness of breath.   alendronate 70 MG tablet Commonly known as:  FOSAMAX Take 70 mg by mouth once a week. Take with a full glass of water on an empty stomach.   b complex vitamins tablet Take 1 tablet by  mouth daily.   docusate sodium 100 MG capsule Commonly known as:  COLACE Take 100 mg by mouth 2 (two) times daily.   DULoxetine 60 MG capsule Commonly known as:  CYMBALTA Take 1 capsule (60 mg total) by mouth daily. What changed:  how much to take   famotidine 20 MG tablet Commonly known as:  PEPCID Take 1 tablet (20 mg total) by mouth 2 (two) times daily. What changed:  when to take this   folic acid 1 MG tablet Commonly known as:  FOLVITE Take 1 mg by mouth daily.   gabapentin 800 MG tablet Commonly known as:   NEURONTIN Take 800 mg by mouth 2 (two) times daily.   hydroxychloroquine 200 MG tablet Commonly known as:  PLAQUENIL Take 2 tablets (400 mg total) by mouth daily. What changed:    how much to take  when to take this   ibuprofen 600 MG tablet Commonly known as:  ADVIL,MOTRIN Take 800 mg by mouth every 8 (eight) hours as needed (pain).   ipratropium-albuterol 0.5-2.5 (3) MG/3ML Soln Commonly known as:  DUONEB Take 3 mLs by nebulization every 6 (six) hours as needed (Wheezing). What changed:  reasons to take this   isosorbide mononitrate 30 MG 24 hr tablet Commonly known as:  IMDUR Take 30 mg by mouth daily.   levofloxacin 750 MG tablet Commonly known as:  LEVAQUIN Take 1 tablet (750 mg total) by mouth daily.   levothyroxine 75 MCG tablet Commonly known as:  SYNTHROID, LEVOTHROID Take 75 mcg by mouth daily before breakfast.   LORazepam 0.5 MG tablet Commonly known as:  ATIVAN Take 0.5 mg by mouth at bedtime.   metoprolol succinate 25 MG 24 hr tablet Commonly known as:  TOPROL-XL Take 12.5 mg by mouth daily.   OLANZapine 15 MG tablet Commonly known as:  ZYPREXA Take 1 tablet (15 mg total) by mouth at bedtime. What changed:  how much to take   pantoprazole 40 MG tablet Commonly known as:  PROTONIX Take 40 mg by mouth daily.   predniSONE 10 MG (21) Tbpk tablet Commonly known as:  STERAPRED UNI-PAK 21 TAB 60mg  day 1 and taper 10 mg daily. 6 days   traZODone 100 MG tablet Commonly known as:  DESYREL Take 100 mg by mouth at bedtime.            Durable Medical Equipment  (From admission, onward)        Start     Ordered   04/23/17 0905  For home use only DME Nebulizer machine  Once    Question:  Patient needs a nebulizer to treat with the following condition  Answer:  Bronchitis   04/23/17 0904   04/23/17 0859  For home use only DME 4 wheeled rolling walker with seat  Once    Question:  Patient needs a walker to treat with the following condition   Answer:  Weakness   04/23/17 0858      Today   VITAL SIGNS:  Blood pressure (!) 103/53, pulse 65, temperature 98.1 F (36.7 C), temperature source Oral, resp. rate 18, height 5\' 1"  (1.549 m), weight 76.7 kg (169 lb), SpO2 90 %.  I/O:    Intake/Output Summary (Last 24 hours) at 04/23/2017 0903 Last data filed at 04/22/2017 1908 Gross per 24 hour  Intake 960 ml  Output -  Net 960 ml    PHYSICAL EXAMINATION:  Physical Exam  GENERAL:  66 y.o.-year-old patient lying in the bed with no acute  distress.  LUNGS: Normal work of breathing. Mild b/l wheezing CARDIOVASCULAR: S1, S2 normal. No murmurs, rubs, or gallops.  ABDOMEN: Soft, non-tender, non-distended. Bowel sounds present. No organomegaly or mass.  NEUROLOGIC: Moves all 4 extremities. PSYCHIATRIC: The patient is alert and oriented x 3.  SKIN: No obvious rash, lesion, or ulcer.   DATA REVIEW:   CBC Recent Labs  Lab 04/17/17 0523  WBC 9.0  HGB 8.5*  HCT 27.0*  PLT 196    Chemistries  Recent Labs  Lab 04/17/17 0523 04/22/17 0502  NA 141  --   K 4.2  --   CL 110  --   CO2 26  --   GLUCOSE 93  --   BUN 13  --   CREATININE 0.48 0.60  CALCIUM 8.3*  --     Cardiac Enzymes No results for input(s): TROPONINI in the last 168 hours.  Microbiology Results  Results for orders placed or performed during the hospital encounter of 04/15/17  Blood Culture (routine x 2)     Status: None   Collection Time: 04/15/17  8:40 AM  Result Value Ref Range Status   Specimen Description BLOOD  Final   Special Requests NONE  Final   Culture NO GROWTH 5 DAYS  Final   Report Status 04/20/2017 FINAL  Final  Blood Culture (routine x 2)     Status: None   Collection Time: 04/15/17  8:45 AM  Result Value Ref Range Status   Specimen Description BLOOD  Final   Special Requests NONE  Final   Culture NO GROWTH 5 DAYS  Final   Report Status 04/20/2017 FINAL  Final  MRSA PCR Screening     Status: None   Collection Time: 04/15/17   2:36 PM  Result Value Ref Range Status   MRSA by PCR NEGATIVE NEGATIVE Final    Comment:        The GeneXpert MRSA Assay (FDA approved for NASAL specimens only), is one component of a comprehensive MRSA colonization surveillance program. It is not intended to diagnose MRSA infection nor to guide or monitor treatment for MRSA infections.     RADIOLOGY:  No results found.  Follow up with PCP in 1 week.  Management plans discussed with the patient, family and they are in agreement.  CODE STATUS:     Code Status Orders  (From admission, onward)        Start     Ordered   04/15/17 1059  Full code  Continuous     04/15/17 1103    Code Status History    Date Active Date Inactive Code Status Order ID Comments User Context   04/04/2016 00:22 04/16/2016 16:39 Full Code [id number]  Gonzella Lex, MD Inpatient   04/04/2016 00:22 04/04/2016 00:22 Full Code [id number]  Gonzella Lex, MD Inpatient   03/30/2016 13:55 04/03/2016 23:38 Full Code [id number]  Awilda Bill, NP ED      TOTAL TIME TAKING CARE OF THIS PATIENT ON DAY OF DISCHARGE: more than 30 minutes.   Leia Alf Shalaunda Weatherholtz M.D on 04/23/2017 at 9:03 AM  Between 7am to 6pm - Pager - [phone]  After 6pm go to [email or web address] - password [email or web address] [place] [facility]  Office  [phone]  CC: Primary care physician; Theotis Burrow, MD  Note: This dictation was prepared with Dragon dictation along with smaller phrase technology. Any transcriptional errors that result from this process are unintentional.

## 2017-05-09 ENCOUNTER — Ambulatory Visit: Payer: Medicare Other | Admitting: Internal Medicine

## 2017-05-11 ENCOUNTER — Other Ambulatory Visit
Admission: RE | Admit: 2017-05-11 | Discharge: 2017-05-11 | Disposition: A | Discharge status: Home / Self Care | Payer: Medicare Other | Source: Other Acute Inpatient Hospital | Attending: Student | Admitting: Student

## 2017-05-11 DIAGNOSIS — R197 Diarrhea, unspecified: Secondary | ICD-10-CM | POA: Insufficient documentation

## 2017-05-11 LAB — GASTROINTESTINAL PANEL BY PCR, STOOL (REPLACES STOOL CULTURE)

## 2017-05-11 LAB — C DIFFICILE QUICK SCREEN W PCR REFLEX
C Diff antigen: NEGATIVE
C Diff interpretation: NOT DETECTED
C Diff toxin: NEGATIVE

## 2017-05-16 ENCOUNTER — Ambulatory Visit (INDEPENDENT_AMBULATORY_CARE_PROVIDER_SITE_OTHER): Payer: Medicare Other | Admitting: Internal Medicine

## 2017-05-16 ENCOUNTER — Ambulatory Visit
Admission: RE | Admit: 2017-05-16 | Discharge: 2017-05-16 | Disposition: A | Discharge status: Home / Self Care | Payer: Medicaid Other | Source: Ambulatory Visit | Attending: Internal Medicine | Admitting: Internal Medicine

## 2017-05-16 ENCOUNTER — Encounter: Payer: Self-pay | Admitting: Internal Medicine

## 2017-05-16 VITALS — BP 124/64 | HR 84 | Resp 16 | Ht 61.0 in | Wt 198.8 lb

## 2017-05-16 DIAGNOSIS — J40 Bronchitis, not specified as acute or chronic: Secondary | ICD-10-CM | POA: Insufficient documentation

## 2017-05-16 DIAGNOSIS — J9611 Chronic respiratory failure with hypoxia: Secondary | ICD-10-CM

## 2017-05-16 DIAGNOSIS — J181 Lobar pneumonia, unspecified organism: Secondary | ICD-10-CM | POA: Diagnosis not present

## 2017-05-16 DIAGNOSIS — J449 Chronic obstructive pulmonary disease, unspecified: Secondary | ICD-10-CM | POA: Insufficient documentation

## 2017-05-16 DIAGNOSIS — M3214 Glomerular disease in systemic lupus erythematosus: Secondary | ICD-10-CM | POA: Diagnosis not present

## 2017-05-16 DIAGNOSIS — G4710 Hypersomnia, unspecified: Secondary | ICD-10-CM

## 2017-05-16 DIAGNOSIS — M329 Systemic lupus erythematosus, unspecified: Secondary | ICD-10-CM

## 2017-05-16 DIAGNOSIS — R0602 Shortness of breath: Secondary | ICD-10-CM

## 2017-05-16 NOTE — Patient Instructions (Signed)
Pulmonary Function Tests Pulmonary function tests (PFTs) are used to measure how well your lungs work, find out what is causing your lung problems, and figure out the best treatment for you. You may have PFTs:  When you have an illness involving the lungs.  To follow changes in your lung function over time if you have a chronic lung disease.  If you are an industrial plant worker. This checks the effects of being exposed to chemicals over a long period of time.  To check lung function before having surgery or other procedures.  To check your lungs if you smoke.  To check if prescribed medicines or treatments are helping your lungs.  Your results will be compared to the expected lung function of someone with healthy lungs who is similar to you in:  Age.  Gender.  Height.  Weight.  Race or ethnicity.  This is done to show how your lungs compare to normal lung function (percent predicted). This is how your health care provider knows if your lung function is normal or not. If you have had PFTs done before, your health care provider will compare your current results with past results. This shows if your lung function is better, worse, or the same as before. Tell a health care provider about:  Any allergies you have.  All medicines you are taking, including inhaler or nebulizer medicines, vitamins, herbs, eye drops, creams, and over-the-counter medicines.  Any blood disorders you have.  Any surgeries you have had, especially recent eye surgery, abdominal surgery, or chest surgery. These can make PFTs difficult or unsafe.  Any medical conditions you have, including chest pain or heart problems, tuberculosis, or respiratory infections such as pneumonia, a cold, or the flu.  Any fear of being in closed spaces (claustrophobia). Some of your tests may be in a closed space. What are the risks? Generally, this is a safe procedure. However, problems may occur,  including:  Light-headedness due to over-breathing (hyperventilation).  An asthma attack from deep breathing.  A collapsed lung.  What happens before the procedure?  Take over-the-counter and prescription medicines only as told by your health care provider. If you take inhaler or nebulizer medicines, ask your health care provider which medicines you should take on the day of your testing. Some inhaler medicines may interfere with PFTs if they are taken shortly before the tests.  Follow your health care provider's instructions on eating and drinking restrictions. This may include avoiding eating large meals and drinking alcohol before the testing.  Do not use any products that contain nicotine or tobacco, such as cigarettes and e-cigarettes. If you need help quitting, ask your health care provider.  Wear comfortable clothing that will not interfere with breathing. What happens during the procedure?  You will be given a soft nose clip to wear. This is done so all of your breaths will go through your mouth instead of your nose.  You will be given a germ-free (sterile) mouthpiece. It will be attached to a machine that measures your breathing (spirometer).  You will be asked to do various breathing maneuvers. The maneuvers will be done by breathing in (inhaling) and breathing out (exhaling). You may be asked to repeat the maneuvers several times before the testing is done.  It is important to follow the instructions exactly to get accurate results. Make sure to blow as hard and as fast as you can when you are told to do so.  You may be given a medicine that   makes the small air passages in your lungs larger (bronchodilator) after testing has been done. This medicine will make it easier for you to breathe.  The tests will be repeated after the bronchodilator has taken effect.  You will be monitored carefully during the procedure for faintness, dizziness, trouble breathing, or any other  problems. The procedure may vary among health care providers and hospitals. What happens after the procedure?  It is up to you to get your test results. Ask your health care provider, or the department that is doing the tests, when your results will be ready. After you have received your test results, talk with your health care provider about treatment options, if necessary. Summary  Pulmonary function tests (PFTs) are used to measure how well your lungs work, find out what is causing your lung problems, and figure out the best treatment for you.  Wear comfortable clothing that will not interfere with breathing.  It is up to you to get your test results. After you have received them, talk with your health care provider about treatment options, if necessary. This information is not intended to replace advice given to you by your health care provider. Make sure you discuss any questions you have with your health care provider. Document Released: 12/15/2003 Document Revised: 03/15/2016 Document Reviewed: 03/15/2016 Elsevier Interactive Patient Education  2018 [place] de la funcin pulmonar Pulmonary Function Tests Las pruebas de la funcin pulmonar se utilizan para Teacher, adult education cun bien funcionan los pulmones, cul es la causa de los problemas pulmonares y cul es el mejor tratamiento para su caso. Probablemente le realicen pruebas de la funcin pulmonar en los siguientes casos:  Cuando tiene una enfermedad que afecta los pulmones.  Para realizar un seguimiento de los cambios en la funcin pulmonar con el paso del tiempo si tiene una enfermedad pulmonar crnica.  Si trabaja en una planta industrial. Se examinan los efectos de estar expuesto a productos qumicos durante un perodo prolongado.  Para examinar la funcin pulmonar antes de someterse a una ciruga u otro procedimiento.  Para examinar los pulmones si fuma.  Para examinar si los medicamentos recetados o los  tratamientos [employer].  Los [employer] se compararn con los de la funcin pulmonar esperada de alguien que tenga pulmones saludables y que sea similar a usted en lo siguiente:  La edad.  El sexo.  La altura.  El [place].  La raza o el origen tnico.  Esto se realiza para observar cmo estn los pulmones en comparacin con la funcin pulmonar normal(prevista en porcentaje). De esta manera, el mdico evaluar si su funcin pulmonar es normal o no. Si ya se ha realizado pruebas de la funcin pulmonar anteriormente, el mdico comparar los resultados actuales con los pasados. As se controlar si la funcin pulmonar ha mejorado, empeorado, o contina igual. Informe al mdico acerca de lo siguiente:  Cualquier alergia que tenga.  Todos los [employer] [place], incluidos medicamentos para Tax inspector y Psychologist, educational, vitaminas, hierbas, gotas oftlmicas, cremas y medicamentos de venta libre.  Enfermedades de la sangre que tenga.  Cualquier ciruga que le hayan realizado; en especial, cirugas oculares, abdominales o torcicas recientes. Estas cirugas pueden dificultar o hacer insegura la realizacin de las pruebas de la funcin pulmonar.  Cualquier afeccin mdica que tenga, incluidos dolor en el pecho o problemas cardacos, tuberculosis o infecciones respiratorias, como neumona, resfro o gripe.  Si tiene temor de Oceanographer). Algunas de las pruebas podran realizarse en espacios cerrados. Cules son  los riesgos? En general, se trata de un procedimiento seguro. Sin embargo, pueden ocurrir complicaciones, por ejemplo:  Sensacin de desvanecimiento debido a la respiracin forzada (hiperventilacin).  Una ataque de asma debido a la respiracin profunda.  Pulmn colapsado.  Qu ocurre antes del procedimiento?  Tome los medicamentos de venta libre y los recetados solamente como se lo haya indicado el mdico. Si utiliza medicamentos, como  inhaladores o nebulizadores, pregntele al mdico qu medicamentos debe tomar Games developer de la realizacin de las pruebas. Es posible que algunos medicamentos que se administran a travs de inhaladores interfieran con las pruebas de la funcin pulmonar si se reciben muy poco antes de la realizacin de estas.  Siga las indicaciones del mdico respecto de las restricciones en las comidas y las bebidas. Esto podra incluir evitar ingerir comidas abundantes y beber alcohol antes de la realizacin de las pruebas.  No consuma ningn producto que contenga nicotina o tabaco, como cigarrillos y Psychologist, sport and exercise. Si necesita ayuda para dejar de fumar, consulte al mdico.  Use ropa cmoda que no interfiera con la respiracin. Qu ocurre durante el procedimiento?  Le darn un broche suave para que se coloque en la nariz. Esto se hace para que toda la respiracin sea por la boca en lugar de por la nariz.  Le entregarn una boquilla libre de microbios (esterilizada). Estar sujeta a un equipo que mide la respiracin(espirmetro).  Le pedirn que realice diferentes maniobras de respiracin. Las [employer] se realizarn inspirando (inhalando) y espirando (exhalando). Quiz le pidan que repita las maniobras varias veces antes de que se realicen las pruebas.  Es importante seguir las instrucciones con exactitud para Materials engineer. Recuerde soplar tan fuerte y rpido como pueda cuando le indiquen [place].  Tal vez le den un medicamento que agranda las pequeas vas respiratorias de los pulmones (broncodilatador) despus de que se realicen las pruebas. Este Firefighter respiracin.  Luego de que el broncodilatador haya surtido [place], se repetirn las pruebas.  Durante el procedimiento, lo controlaran exhaustivamente para observar si sufre desmayos, mareos, dificultad para respirar o cualquier otro problema. Este procedimiento puede variar segn el mdico y el hospital. Sander Nephew  sucede despus del procedimiento?  Obtener los [employer] estudios depende de usted. Consulte a su mdico o a alguien del departamento donde se realice los estudios cundo [employer]. Luego de haber recibido [employer], hable con el mdico para [employer] opciones de [place], si es necesario. Resumen  Las pruebas de la funcin pulmonar se utilizan para Teacher, adult education cun [employer], cul es la causa de los problemas pulmonares y cul es el mejor tratamiento para su caso.  Use ropa cmoda que no interfiera con la respiracin.  Obtener los [employer] estudios depende de usted. Luego de haberlos recibido, hable con el mdico para [employer] opciones de [place], si es necesario. Esta informacin no tiene Marine scientist el consejo del mdico. Asegrese de hacerle al mdico cualquier pregunta que tenga. Document Released: 08/09/2008 Document Revised: 09/04/2016 Document Reviewed: 09/04/2016 Elsevier Interactive Patient Education  2018 [employer].

## 2017-05-16 NOTE — Progress Notes (Signed)
[facility] [place], [place] [address]  Pulmonary Sleep Medicine  Office Visit Note  Patient Name: Shelly Freeman DOB: Jan 25, 1951 MRN [id number]  Date of Service: 05/16/2017     Complaints/HPI:  She presents to the office for follow-up after being admitted to the hospital.  She was admitted with acute respiratory failure and also diagnosis of lobar pneumonia.  The patient is now doing better as far as her symptoms are concerned.  Still has little bit of shortness of breath and little bit of cough.  She has not had a follow-up chest x-ray done so this will need to be done.  She is currently not using her oxygen.  She was noted to have no wheezing no fevers no chest pain at this time.  Current Medication: Outpatient Encounter Medications as of 05/16/2017  Medication Sig Note  . acetaminophen (TYLENOL) 325 MG tablet Take 650 mg by mouth every 6 (six) hours as needed.   [name] albuterol (PROVENTIL HFA;VENTOLIN HFA) 108 (90 Base) MCG/ACT inhaler Inhale 2 puffs into the lungs every 6 (six) hours as needed for wheezing or shortness of breath.   [name] alendronate (FOSAMAX) 70 MG tablet Take 70 mg by mouth once a week. Take with a full glass of water on an empty stomach. 04/15/2017: Due for one today  . b complex vitamins tablet Take 1 tablet by mouth daily.   [name] docusate sodium (COLACE) 100 MG capsule Take 100 mg by mouth 2 (two) times daily.   . DULoxetine (CYMBALTA) 60 MG capsule Take 1 capsule (60 mg total) by mouth daily. (Patient taking differently: Take 30 mg by mouth daily. )   . famotidine (PEPCID) 20 MG tablet Take 1 tablet (20 mg total) by mouth 2 (two) times daily. (Patient taking differently: Take 20 mg by mouth daily. )   . folic acid (FOLVITE) 1 MG tablet Take 1 mg by mouth daily.   [name] gabapentin (NEURONTIN) 800 MG tablet Take 800 mg by mouth 2 (two) times daily.   . hydroxychloroquine (PLAQUENIL) 200 MG tablet Take 2 tablets (400 mg total) by mouth daily. (Patient  taking differently: Take 200 mg by mouth 2 (two) times daily. )   . ibuprofen (ADVIL,MOTRIN) 600 MG tablet Take 800 mg by mouth every 8 (eight) hours as needed (pain).   [name] ipratropium-albuterol (DUONEB) 0.5-2.5 (3) MG/3ML SOLN Take 3 mLs by nebulization every 6 (six) hours as needed (Wheezing).   . isosorbide mononitrate (IMDUR) 30 MG 24 hr tablet Take 30 mg by mouth daily.   [name] levofloxacin (LEVAQUIN) 750 MG tablet Take 1 tablet (750 mg total) by mouth daily.   [name] levothyroxine (SYNTHROID, LEVOTHROID) 75 MCG tablet Take 75 mcg by mouth daily before breakfast.   . LORazepam (ATIVAN) 0.5 MG tablet Take 0.5 mg by mouth at bedtime.   . metoprolol succinate (TOPROL-XL) 25 MG 24 hr tablet Take 12.5 mg by mouth daily.   [name] OLANZapine (ZYPREXA) 15 MG tablet Take 1 tablet (15 mg total) by mouth at bedtime. (Patient taking differently: Take 20 mg by mouth at bedtime. )   . pantoprazole (PROTONIX) 40 MG tablet Take 40 mg by mouth daily.   . predniSONE (STERAPRED UNI-PAK 21 TAB) 10 MG (21) TBPK tablet 85m day 1 and taper 10 mg daily. 6 days   . traZODone (DESYREL) 100 MG tablet Take 100 mg by mouth at bedtime.    No facility-administered encounter medications on file as of 05/16/2017.     Surgical History:  Past Surgical History:  Procedure Laterality Date  . APPENDECTOMY    . BREAST LUMPECTOMY    . CESAREAN SECTION    . GASTRIC BYPASS    . TUBAL LIGATION      Medical History: Past Medical History:  Diagnosis Date  . Arthritis   . Collagen vascular disease ([place])   . Depression   . Heart valve problem    per grandaughter need surgery but they told her may not survive;  [name] Hypertension   . Hypothyroidism   . Lupus   . Neuromuscular disorder ([place])   . Thyroid disease     Family History: History reviewed. No pertinent family history.  Social History: Social History   Socioeconomic History  . Marital status: Divorced    Spouse name: Not on file  . Number of children: Not on file  .  Years of education: Not on file  . Highest education level: Not on file  Social Needs  . Financial resource strain: Not on file  . Food insecurity - worry: Not on file  . Food insecurity - inability: Not on file  . Transportation needs - medical: Not on file  . Transportation needs - non-medical: Not on file  Occupational History  . Not on file  Tobacco Use  . Smoking status: Never Smoker  . Smokeless tobacco: Never Used  Substance and Sexual Activity  . Alcohol use: No  . Drug use: No  . Sexual activity: No    Birth control/protection: Abstinence  Other Topics Concern  . Not on file  Social History Narrative  . Not on file     ROS  General: (-) fever, (-) chills, (-) night sweats, + weakness Skin: (-) rashes, (-) itching,. Eyes: (-) visual changes, (-) redness, (-) itching, (-) double or blurred vision. Nose and Sinuses: (-) nasal stuffiness or itchiness Mouth and Throat: (-) sore throat, (-) hoarseness. Neck: (-) swollen glands, (-) enlarged thyroid, (-) neck pain. Respiratory: +cough, (-) bloody sputum, +shortness of breath, (-) wheezing. Cardiovascular: (-) ankle swelling, (-) chest pain. Lymphatic: (-) lymph node enlargement, (-) lymph node tenderness. Neurologic: (-) numbness, (-) tingling,(-) dizziness. Psychiatric: (-) anxiety, (-) depression.  Vital Signs: Blood pressure 124/64, pulse 84, resp. rate 16, height 5' 1"  (1.549 m), weight 198 lb 12.8 oz (90.2 kg), SpO2 95 %.  Examination: General Appearance: The patient is well-developed, well-nourished, and in no distress. Skin: Gross inspection of skin demonstrates no evidence of abnormality. Head: Patient's head is normocephalic, no gross deformities. Eyes: no gross deformities noted. ENT: ears appear grossly normal. Nasopharynx appears to be normal. Neck: Supple. No thyromegaly. No LAD. Respiratory: Lungs are clear to auscultation with no adventitious sounds. Cardiovascular: Normal S1 and S2 without murmur or  rub. Extremities: No cyanosis. pulses are equal. Neurologic: Alert and oriented. No involuntary movements.  LABS: Recent Results (from the past 2160 hour(s))  Lactic acid, plasma     Status: None   Collection Time: 04/15/17  8:40 AM  Result Value Ref Range   Lactic Acid, Venous 1.1 0.5 - 1.9 mmol/L  Comprehensive metabolic panel     Status: Abnormal   Collection Time: 04/15/17  8:40 AM  Result Value Ref Range   Sodium 140 135 - 145 mmol/L   Potassium 4.4 3.5 - 5.1 mmol/L   Chloride 107 101 - 111 mmol/L   CO2 20 (L) 22 - 32 mmol/L   Glucose, Bld 110 (H) 65 - 99 mg/dL   BUN 18 6 - 20 mg/dL  Creatinine, Ser 0.87 0.44 - 1.00 mg/dL   Calcium 8.5 (L) 8.9 - 10.3 mg/dL   Total Protein 6.7 6.5 - 8.1 g/dL   Albumin 3.5 3.5 - 5.0 g/dL   AST 56 (H) 15 - 41 U/L   ALT 23 14 - 54 U/L   Alkaline Phosphatase 75 38 - 126 U/L   Total Bilirubin 0.5 0.3 - 1.2 mg/dL   GFR calc non Af Amer >60 >60 mL/min   GFR calc Af Amer >60 >60 mL/min    Comment: (NOTE) The eGFR has been calculated using the CKD EPI equation. This calculation has not been validated in all clinical situations. eGFR's persistently <60 mL/min signify possible Chronic Kidney Disease.    Anion gap 13 5 - 15  Lipase, blood     Status: None   Collection Time: 04/15/17  8:40 AM  Result Value Ref Range   Lipase 17 11 - 51 U/L  Troponin I     Status: None   Collection Time: 04/15/17  8:40 AM  Result Value Ref Range   Troponin I <0.03 <0.03 ng/mL  CBC WITH DIFFERENTIAL     Status: Abnormal   Collection Time: 04/15/17  8:40 AM  Result Value Ref Range   WBC 8.7 3.6 - 11.0 K/uL   RBC 3.84 3.80 - 5.20 MIL/uL   Hemoglobin 9.8 (L) 12.0 - 16.0 g/dL   HCT 30.4 (L) 35.0 - 47.0 %   MCV 79.1 (L) 80.0 - 100.0 fL   MCH 25.5 (L) 26.0 - 34.0 pg   MCHC 32.3 32.0 - 36.0 g/dL   RDW 15.5 (H) 11.5 - 14.5 %   Platelets 192 150 - 440 K/uL   Neutrophils Relative % 85 %   Neutro Abs 7.4 (H) 1.4 - 6.5 K/uL   Lymphocytes Relative 7 %   Lymphs Abs  0.6 (L) 1.0 - 3.6 K/uL   Monocytes Relative 7 %   Monocytes Absolute 0.6 0.2 - 0.9 K/uL   Eosinophils Relative 0 %   Eosinophils Absolute 0.0 0 - 0.7 K/uL   Basophils Relative 1 %   Basophils Absolute 0.0 0 - 0.1 K/uL  Blood Culture (routine x 2)     Status: None   Collection Time: 04/15/17  8:40 AM  Result Value Ref Range   Specimen Description BLOOD    Special Requests NONE    Culture NO GROWTH 5 DAYS    Report Status 04/20/2017 FINAL   Urinalysis, Routine w reflex microscopic     Status: Abnormal   Collection Time: 04/15/17  8:40 AM  Result Value Ref Range   Color, Urine YELLOW (A) YELLOW   APPearance CLEAR (A) CLEAR   Specific Gravity, Urine 1.014 1.005 - 1.030   pH 5.0 5.0 - 8.0   Glucose, UA NEGATIVE NEGATIVE mg/dL   Hgb urine dipstick NEGATIVE NEGATIVE   Bilirubin Urine NEGATIVE NEGATIVE   Ketones, ur NEGATIVE NEGATIVE mg/dL   Protein, ur NEGATIVE NEGATIVE mg/dL   Nitrite NEGATIVE NEGATIVE   Leukocytes, UA NEGATIVE NEGATIVE  Protime-INR     Status: None   Collection Time: 04/15/17  8:40 AM  Result Value Ref Range   Prothrombin Time 13.0 11.4 - 15.2 seconds   INR 0.99   CK     Status: Abnormal   Collection Time: 04/15/17  8:40 AM  Result Value Ref Range   Total CK 1,418 (H) 38 - 234 U/L  Blood Culture (routine x 2)     Status: None   Collection  Time: 04/15/17  8:45 AM  Result Value Ref Range   Specimen Description BLOOD    Special Requests NONE    Culture NO GROWTH 5 DAYS    Report Status 04/20/2017 FINAL   Influenza panel by PCR (type A & B)     Status: None   Collection Time: 04/15/17  8:50 AM  Result Value Ref Range   Influenza A By PCR NEGATIVE NEGATIVE   Influenza B By PCR NEGATIVE NEGATIVE    Comment: (NOTE) The Xpert Xpress Flu assay is intended as an aid in the diagnosis of  influenza and should not be used as a sole basis for treatment.  This  assay is FDA approved for nasopharyngeal swab specimens only. Nasal  washings and aspirates are  unacceptable for Xpert Xpress Flu testing.   MRSA PCR Screening     Status: None   Collection Time: 04/15/17  2:36 PM  Result Value Ref Range   MRSA by PCR NEGATIVE NEGATIVE    Comment:        The GeneXpert MRSA Assay (FDA approved for NASAL specimens only), is one component of a comprehensive MRSA colonization surveillance program. It is not intended to diagnose MRSA infection nor to guide or monitor treatment for MRSA infections.   HIV antibody (Routine Testing)     Status: None   Collection Time: 04/16/17  3:26 AM  Result Value Ref Range   HIV Screen 4th Generation wRfx Non Reactive Non Reactive    Comment: (NOTE) Performed At: [facility] [place], [name] [id number] Rush Farmer MD FY:[id number]   Basic metabolic panel     Status: Abnormal   Collection Time: 04/16/17  3:26 AM  Result Value Ref Range   Sodium 141 135 - 145 mmol/L   Potassium 4.0 3.5 - 5.1 mmol/L   Chloride 111 101 - 111 mmol/L   CO2 23 22 - 32 mmol/L   Glucose, Bld 138 (H) 65 - 99 mg/dL   BUN 19 6 - 20 mg/dL   Creatinine, Ser 0.57 0.44 - 1.00 mg/dL   Calcium 7.9 (L) 8.9 - 10.3 mg/dL   GFR calc non Af Amer >60 >60 mL/min   GFR calc Af Amer >60 >60 mL/min    Comment: (NOTE) The eGFR has been calculated using the CKD EPI equation. This calculation has not been validated in all clinical situations. eGFR's persistently <60 mL/min signify possible Chronic Kidney Disease.    Anion gap 7 5 - 15  CBC     Status: Abnormal   Collection Time: 04/16/17  3:26 AM  Result Value Ref Range   WBC 10.0 3.6 - 11.0 K/uL   RBC 3.42 (L) 3.80 - 5.20 MIL/uL   Hemoglobin 8.5 (L) 12.0 - 16.0 g/dL   HCT 27.2 (L) 35.0 - 47.0 %   MCV 79.6 (L) 80.0 - 100.0 fL   MCH 24.7 (L) 26.0 - 34.0 pg   MCHC 31.1 (L) 32.0 - 36.0 g/dL   RDW 15.5 (H) 11.5 - 14.5 %   Platelets 194 150 - 440 K/uL  CK     Status: Abnormal   Collection Time: 04/16/17  3:26 AM  Result Value Ref Range   Total CK 1,601 (H) 38 -  234 U/L  Glucose, capillary     Status: Abnormal   Collection Time: 04/16/17  7:49 AM  Result Value Ref Range   Glucose-Capillary 113 (H) 65 - 99 mg/dL   Comment 1 Notify RN   CBC  Status: Abnormal   Collection Time: 04/17/17  5:23 AM  Result Value Ref Range   WBC 9.0 3.6 - 11.0 K/uL   RBC 3.42 (L) 3.80 - 5.20 MIL/uL   Hemoglobin 8.5 (L) 12.0 - 16.0 g/dL   HCT 27.0 (L) 35.0 - 47.0 %   MCV 78.9 (L) 80.0 - 100.0 fL   MCH 24.9 (L) 26.0 - 34.0 pg   MCHC 31.5 (L) 32.0 - 36.0 g/dL   RDW 15.9 (H) 11.5 - 14.5 %   Platelets 196 150 - 440 K/uL  Basic metabolic panel     Status: Abnormal   Collection Time: 04/17/17  5:23 AM  Result Value Ref Range   Sodium 141 135 - 145 mmol/L   Potassium 4.2 3.5 - 5.1 mmol/L   Chloride 110 101 - 111 mmol/L   CO2 26 22 - 32 mmol/L   Glucose, Bld 93 65 - 99 mg/dL   BUN 13 6 - 20 mg/dL   Creatinine, Ser 0.48 0.44 - 1.00 mg/dL   Calcium 8.3 (L) 8.9 - 10.3 mg/dL   GFR calc non Af Amer >60 >60 mL/min   GFR calc Af Amer >60 >60 mL/min    Comment: (NOTE) The eGFR has been calculated using the CKD EPI equation. This calculation has not been validated in all clinical situations. eGFR's persistently <60 mL/min signify possible Chronic Kidney Disease.    Anion gap 5 5 - 15  CK     Status: Abnormal   Collection Time: 04/17/17  5:23 AM  Result Value Ref Range   Total CK 681 (H) 38 - 234 U/L  Glucose, capillary     Status: None   Collection Time: 04/17/17  8:08 AM  Result Value Ref Range   Glucose-Capillary 86 65 - 99 mg/dL   Comment 1 Notify RN   Glucose, capillary     Status: None   Collection Time: 04/18/17  8:05 AM  Result Value Ref Range   Glucose-Capillary 79 65 - 99 mg/dL  Glucose, capillary     Status: Abnormal   Collection Time: 04/20/17  7:28 AM  Result Value Ref Range   Glucose-Capillary 147 (H) 65 - 99 mg/dL  Glucose, capillary     Status: Abnormal   Collection Time: 04/21/17  7:17 AM  Result Value Ref Range   Glucose-Capillary 116 (H)  65 - 99 mg/dL  Glucose, capillary     Status: None   Collection Time: 04/21/17  4:35 PM  Result Value Ref Range   Glucose-Capillary 77 65 - 99 mg/dL   Comment 1 Notify RN   Glucose, capillary     Status: None   Collection Time: 04/21/17  9:54 PM  Result Value Ref Range   Glucose-Capillary 92 65 - 99 mg/dL   Comment 1 Notify RN   Creatinine, serum     Status: None   Collection Time: 04/22/17  5:02 AM  Result Value Ref Range   Creatinine, Ser 0.60 0.44 - 1.00 mg/dL   GFR calc non Af Amer >60 >60 mL/min   GFR calc Af Amer >60 >60 mL/min    Comment: (NOTE) The eGFR has been calculated using the CKD EPI equation. This calculation has not been validated in all clinical situations. eGFR's persistently <60 mL/min signify possible Chronic Kidney Disease.   Glucose, capillary     Status: None   Collection Time: 04/22/17  7:47 AM  Result Value Ref Range   Glucose-Capillary 88 65 - 99 mg/dL   Comment 1  Notify RN   Glucose, capillary     Status: None   Collection Time: 04/23/17  7:35 AM  Result Value Ref Range   Glucose-Capillary 97 65 - 99 mg/dL  Gastrointestinal Panel by PCR , Stool     Status: None   Collection Time: 05/11/17  8:40 AM  Result Value Ref Range   Campylobacter species NOT DETECTED NOT DETECTED   Plesimonas shigelloides NOT DETECTED NOT DETECTED   Salmonella species NOT DETECTED NOT DETECTED   Yersinia enterocolitica NOT DETECTED NOT DETECTED   Vibrio species NOT DETECTED NOT DETECTED   Vibrio cholerae NOT DETECTED NOT DETECTED   Enteroaggregative E coli (EAEC) NOT DETECTED NOT DETECTED   Enteropathogenic E coli (EPEC) NOT DETECTED NOT DETECTED   Enterotoxigenic E coli (ETEC) NOT DETECTED NOT DETECTED   Shiga like toxin producing E coli (STEC) NOT DETECTED NOT DETECTED   Shigella/Enteroinvasive E coli (EIEC) NOT DETECTED NOT DETECTED   Cryptosporidium NOT DETECTED NOT DETECTED   Cyclospora cayetanensis NOT DETECTED NOT DETECTED   Entamoeba histolytica NOT DETECTED  NOT DETECTED   Giardia lamblia NOT DETECTED NOT DETECTED   Adenovirus F40/41 NOT DETECTED NOT DETECTED   Astrovirus NOT DETECTED NOT DETECTED   Norovirus GI/GII NOT DETECTED NOT DETECTED   Rotavirus A NOT DETECTED NOT DETECTED   Sapovirus (I, II, IV, and V) NOT DETECTED NOT DETECTED    Comment: Performed at [facility], [address] [place], [place] [address]  C difficile quick scan w PCR reflex     Status: None   Collection Time: 05/11/17  8:40 AM  Result Value Ref Range   C Diff antigen NEGATIVE NEGATIVE   C Diff toxin NEGATIVE NEGATIVE   C Diff interpretation No C. difficile detected.     Comment: Performed at [facility], [address], [place], [place] [address]    Radiology: No results found.  No results found.  Dg Chest 2 View  Result Date: 04/17/2017 CLINICAL DATA:  Shortness of breath and cough. History of systemic lupus erythematosus EXAM: CHEST  2 VIEW COMPARISON:  April 15, 2017 and March 31, 2016 FINDINGS: There is scarring in each upper lobe. There is no edema or consolidation. The heart size and pulmonary vascularity are normal. No adenopathy. There old healed rib fractures on the right. IMPRESSION: Upper lobe scarring bilaterally. No edema or consolidation. Cardiac silhouette within normal limits. No evident adenopathy. Electronically Signed   By: Lowella Grip III M.D.   On: 04/17/2017 09:51   Ct Chest W Contrast  Result Date: 04/18/2017 CLINICAL DATA:  Two-day history of cough, fever, shortness of breath and hypoxia. Current history of asthma, hypertension and lupus. Surgical history includes gastric bypass. EXAM: CT CHEST WITH CONTRAST TECHNIQUE: Multidetector CT imaging of the chest was performed during intravenous contrast administration. CONTRAST:  88m ISOVUE-300 IOPAMIDOL INJECTION 61% IV. COMPARISON:  No prior CT.  Multiple prior chest x-rays. FINDINGS: Cardiovascular: Heart size upper normal. Moderate LAD and left circumflex  coronary atherosclerosis. Very small pericardial effusion. Mild atherosclerosis involving the thoracic and upper abdominal aorta without evidence of aneurysm. Mediastinum/Nodes: Borderline to mildly enlarged right hilar lymph nodes measuring approximately 2.3 x 1.7 cm. Normal sized mediastinal and axillary lymph nodes. Normal appearing esophagus. Small hiatal hernia. Visualized thyroid gland atrophic and unremarkable otherwise. Lungs/Pleura: Patchy ground-glass opacities in the upper lobes. Areas of parenchymal scarring bilaterally, associated with bronchiectasis in the left lower lobe. Peripheral subsegmental consolidation involving the posterior and lateral right upper lobe. In the  posterior left upper lobe there is a 8 x 12 mm nodule (10 mm mean diameter) on image 39 of series 3. No other nodules are identified. Small bilateral pleural effusions. Upper Abdomen: Post surgical changes related to the prior gastric bypass procedure. Liver incompletely imaged, though there may be mild hepatomegaly. No focal hepatic parenchymal abnormality. Spleen upper normal in size without focal parenchymal abnormality, measuring approximately 11.8 x 5.7 x 9.2 cm. Visualized upper abdomen otherwise unremarkable. Musculoskeletal: Schmorl's node involving the upper endplate of T6. Calcified T10-11 intervertebral disc. Degenerative disc disease and spondylosis T11-12. No acute osseous abnormalities. IMPRESSION: 1. Likely acute pneumonia versus pneumonitis involving the upper lobes bilaterally, superimposed upon what is likely scarring throughout both lungs related to long-standing lupus. Alternatively, these linear opacities in both lungs may represent areas of linear atelectasis. 2. Possible 10 mm nodule involving the posterior left upper lobe. This may represent acinar airspace disease. Follow-up chest CT in 1-2 months after treatment is recommended to be sure that the nodule resolves with treatment. 3. Very small bilateral pleural  effusions. 4. Borderline right hilar lymphadenopathy, most likely reactive and related to long-standing lupus. No pathologic lymphadenopathy. 5. Small hiatal hernia. 6. Mild to moderate LAD and left circumflex coronary atherosclerosis. Small pericardial effusion. 7. Possible mild hepatomegaly, though the liver is incompletely imaged. Aortic Atherosclerosis (ICD10-I70.0). Electronically Signed   By: Evangeline Dakin M.D.   On: 04/18/2017 15:35      Assessment and Plan: Patient Active Problem List   Diagnosis Date Noted  . Major neurocognitive disorder, due to vascular disease, with behavioral disturbance, mild 04/15/2016  . Severe recurrent major depression with psychotic features ([place]) 04/04/2016  . Lupus 04/04/2016  . GERD (gastroesophageal reflux disease) 04/04/2016  . COPD (chronic obstructive pulmonary disease) ([place]) 04/04/2016  . Acute respiratory failure with hypoxia ([place]) 04/03/2016  . Encephalopathy, metabolic 33/54/5625  . Hypokalemia 04/03/2016  . Pain in limb 04/03/2016  . Hypothyroidism 04/03/2016  . Essential hypertension 04/03/2016  . Intentional opiate overdose ([place]) 03/30/2016    1. COPD   Acute exacerbation of COPD now improved  She will need pulmonary function test to be done these will be ordered  2. Chronic respiratory failure with hypoxia  to be assess for oxygen therapy We will start on oxygen after we do 6 min walk to assess  3. Lobar Pneumonia  clinically improved  We will get a followup chest x-ray to make sure the infiltrates have resolved  4. SLE  she follows with Rheumatology she will continue to see Dr. Jefm Bryant at the clinic  5. Shortness of breath  she is clinically improved will continue to monitor closely  As already mentioned pulmonary function test 7 ordered  6. Hypersomnia  she will need a sleep study to be done we discussed this well she was in the hospital.  She would like to have a home study done  General Counseling: I have discussed  the findings of the evaluation and examination with Onisha.  I have also discussed any further diagnostic evaluation thatmay be needed or ordered today. Marcell verbalizes understanding of the findings of todays visit. We also reviewed her medications today and discussed drug interactions and side effects including but not limited excessive drowsiness and altered mental states. We also discussed that there is always a risk not just to her but also people around her. she has been encouraged to call the office with any questions or concerns that should arise related to todays visit.    Time  spent: 70mn  I have personally obtained a history, examined the patient, evaluated laboratory and imaging results, formulated the assessment and plan and placed orders.    SAllyne Gee MD FB[facility]ulmonary [facility] Sleep medicine

## 2017-05-23 ENCOUNTER — Ambulatory Visit: Payer: Medicaid Other | Admitting: Internal Medicine

## 2017-05-29 ENCOUNTER — Ambulatory Visit: Payer: Medicare Other | Admitting: Internal Medicine

## 2017-05-29 DIAGNOSIS — J449 Chronic obstructive pulmonary disease, unspecified: Secondary | ICD-10-CM

## 2017-05-29 DIAGNOSIS — R0602 Shortness of breath: Secondary | ICD-10-CM

## 2017-05-30 ENCOUNTER — Ambulatory Visit: Payer: Medicare Other | Admitting: Internal Medicine

## 2017-05-30 DIAGNOSIS — G4710 Hypersomnia, unspecified: Secondary | ICD-10-CM | POA: Diagnosis not present

## 2017-06-04 NOTE — Progress Notes (Signed)
[name], [id number]  DATE OF SERVICE:  May 29, 2017  Complete Pulmonary Function Testing Interpretation:  FINDINGS:  Forced Vital Capacity is  normal. Patients FEV1 is  normal. FEV1/FVC ratio is  normal. Post bronchodilator there is  No significant improvement in the FEV1  However clinical improvement may occur in the absence of spirometric improvement. Total Lung Capacity by gas dilution method is  increased.  Total Lung Capacity by body box is  normal.  Residual Volume is  increased. RV/TLC ratio is  increased.  Functional Residual Capacity is  normal. The DLCO is  normal.  IMPRESSION:  -This Pulmonary Function Study is  Within normal limits. -After Bronchodilators were given there was  No significant improvement in the FEV1 however clinical improvement may occur in the absence of spirometric improvement does not preclude the use of bronchodilators. -There is  no hyperinflation -The DLCO  Is normal.   Allyne Gee, MD [facility] Pulmonary Critical Care Medicine Sleep Medicine

## 2017-06-04 NOTE — Patient Instructions (Signed)

## 2017-06-11 NOTE — Procedures (Signed)
[facility] [place], [place] [address]  Sleep Specialist: Allyne Gee, MD [place] Sleep Study Interpretation  Patient Name: Shelly Freeman Patient MR [id number] DOB:1951/03/28  Indications for study:  BMI: 37.4     Excessive Daytime sleepiness, Snoring  Respiratory Data:  Total Respiratory Disturbance Index:  5.7  Total Obstructive Apneas:  6  Total Hypopneas:  36  Total Central Apneas:  0  If the AHI is greater than 5 per hour patient qualifies for PAP evaluation  Oximetry Data:  Oxygen Desaturation Index: 6.3  Lowest Desaturation:  86%  Cardiac Data:  Minimum Heart Rate:  55  Maximum Heart Rate:  91   Impression / Diagnosis:   this home study demonstrates presence of mild sleep disordered breathing with the respiratory index of 5.7 per hour.  Further evaluation with CPAP titration may be recommended in the right clinical setting  Recommendations: 1.  Consider Auto PAP with pressure ranges 5-20 cmH20 with download, or facility based PAP Titration Study  2.  Consider PAP interface mask fitted for patient comfort, Heated Humidification & PAP compliance monitoring (1 month, 3 months & 12 months after PAP initiation)  3. Consider treatment with mandibular advancement splint (MAS) or referral to an ENT surgeon for modification to the upper airway if the patient prefers an alternate therapy or the PAP trial is unsuccessful  4. Consider sleep hygiene measures  5. Consider behavioral therapy such as weight reduction or smoking cessation as appropriate for this patient  6. Consider advising patient against the use of alcohol or sedatives in so much as these substances can worsen excessive daytime sleepiness and respiratory disturbances of sleep  7. Consider advising patient against participating in potentially dangerous activities while drowsy such as operating a motor vehicle, heavy equipment or power tools as it can put  them and others in danger  8. Consider advising patient of the long term consequences of OSA if left untreated, need for treatment and close follow up  9. Clinical follow up as deemed necessary     This Level III home sleep study was performed using the [employer], a 4 channel screening device subject to limitations. Depending on actual total sleep time, not measured in this study, the AHI (sum of apneas and hypopneas/hr of sleep) and therefore the severity of sleep apnea may be underestimated. As with any single night study, including Level 1 attended PSG, severity of sleep apnea may also be underestimated due to the lack of supine and/or REM sleep.  The interpretation associated with this report is based on normal values and degrees of severity in accordance with AASM parameters and/or estimated from multiple sources in the literature for adults ages 73-80+. These may not agree with the displayed values. The patient's treating physician should use the interpretation and recommendations in conjunction with the overall clinical evaluation and treatment of the patient.  Some of the terminology used in this scored [facility] report was developed several years ago and may not always be in accordance with current nomenclature. This in no way affects the accuracy of the data or the reliability of the interpretation and recommendations.

## 2017-06-13 ENCOUNTER — Ambulatory Visit (INDEPENDENT_AMBULATORY_CARE_PROVIDER_SITE_OTHER): Payer: Medicare Other | Admitting: Internal Medicine

## 2017-06-13 ENCOUNTER — Encounter: Payer: Self-pay | Admitting: Internal Medicine

## 2017-06-13 VITALS — BP 131/68 | HR 80 | Resp 16 | Ht 62.0 in | Wt 199.6 lb

## 2017-06-13 DIAGNOSIS — R0602 Shortness of breath: Secondary | ICD-10-CM

## 2017-06-13 DIAGNOSIS — G4733 Obstructive sleep apnea (adult) (pediatric): Secondary | ICD-10-CM

## 2017-06-13 NOTE — Progress Notes (Signed)
[facility] [place], [place] [address]  Pulmonary Sleep Medicine  Office Visit Note  Patient Name: Shelly Freeman DOB: November 19, 1950 MRN [id number]  Date of Service: 06/13/2017  Complaints/HPI: She had mild OSA noted on her sleep study> I discussed with her that she may need to go on CPAP since she does have significant fatigue tiredness during the daytime. She does lay down to rest during the dyatime. Her PFT was within normal limits  ROS  General: (-) fever, (-) chills, (-) night sweats, (-) weakness Skin: (-) rashes, (-) itching,. Eyes: (-) visual changes, (-) redness, (-) itching. Nose and Sinuses: (-) nasal stuffiness or itchiness, (-) postnasal drip, (-) nosebleeds, (-) sinus trouble. Mouth and Throat: (-) sore throat, (-) hoarseness. Neck: (-) swollen glands, (-) enlarged thyroid, (-) neck pain. Respiratory: - cough, (-) bloody sputum, + shortness of breath, - wheezing. Cardiovascular: - ankle swelling, (-) chest pain. Lymphatic: (-) lymph node enlargement. Neurologic: (-) numbness, (-) tingling. Psychiatric: (-) anxiety, (-) depression   Current Medication: Outpatient Encounter Medications as of 06/13/2017  Medication Sig Note  . acetaminophen (TYLENOL) 325 MG tablet Take 650 mg by mouth every 6 (six) hours as needed.   [name] albuterol (PROVENTIL HFA;VENTOLIN HFA) 108 (90 Base) MCG/ACT inhaler Inhale 2 puffs into the lungs every 6 (six) hours as needed for wheezing or shortness of breath.   [name] alendronate (FOSAMAX) 70 MG tablet Take 70 mg by mouth once a week. Take with a full glass of water on an empty stomach. 04/15/2017: Due for one today  . B Complex Vitamins (B COMPLEX PO) Take by mouth.   [name] b complex vitamins tablet Take 1 tablet by mouth daily.   . celecoxib (CELEBREX) 200 MG capsule Take by mouth.   . diclofenac sodium (VOLTAREN) 1 % GEL Apply topically.   . docusate sodium (COLACE) 100 MG capsule Take 100 mg by mouth 2 (two) times daily.   .  DULoxetine (CYMBALTA) 60 MG capsule Take 1 capsule (60 mg total) by mouth daily. (Patient taking differently: Take 30 mg by mouth daily. )   . famotidine (PEPCID) 20 MG tablet Take 1 tablet (20 mg total) by mouth 2 (two) times daily. (Patient taking differently: Take 20 mg by mouth daily. )   . folic acid (FOLVITE) 1 MG tablet Take 1 mg by mouth daily.   [name] gabapentin (NEURONTIN) 800 MG tablet Take 800 mg by mouth 2 (two) times daily.   . hydroxychloroquine (PLAQUENIL) 200 MG tablet Take 2 tablets (400 mg total) by mouth daily. (Patient taking differently: Take 200 mg by mouth 2 (two) times daily. )   . ibuprofen (ADVIL,MOTRIN) 600 MG tablet Take 800 mg by mouth every 8 (eight) hours as needed (pain).   [name] ipratropium-albuterol (DUONEB) 0.5-2.5 (3) MG/3ML SOLN Take 3 mLs by nebulization every 6 (six) hours as needed (Wheezing).   . isosorbide mononitrate (IMDUR) 30 MG 24 hr tablet Take 30 mg by mouth daily.   [name] levofloxacin (LEVAQUIN) 750 MG tablet Take 1 tablet (750 mg total) by mouth daily. (Patient not taking: Reported on 05/16/2017)   . levothyroxine (SYNTHROID, LEVOTHROID) 75 MCG tablet Take 75 mcg by mouth daily before breakfast.   . LORazepam (ATIVAN) 0.5 MG tablet Take 0.5 mg by mouth at bedtime.   . metoprolol succinate (TOPROL-XL) 25 MG 24 hr tablet Take 12.5 mg by mouth daily.   [name] OLANZapine (ZYPREXA) 15 MG tablet Take 1 tablet (15 mg total) by mouth at bedtime. (  Patient taking differently: Take 20 mg by mouth at bedtime. )   . pantoprazole (PROTONIX) 40 MG tablet Take 40 mg by mouth daily.   . predniSONE (STERAPRED UNI-PAK 21 TAB) 10 MG (21) TBPK tablet 5m day 1 and taper 10 mg daily. 6 days (Patient not taking: Reported on 05/16/2017)   . sucralfate (CARAFATE) 1 g tablet Take by mouth.   . traZODone (DESYREL) 100 MG tablet Take 100 mg by mouth at bedtime.    No facility-administered encounter medications on file as of 06/13/2017.     Surgical History: Past Surgical History:  Procedure  Laterality Date  . APPENDECTOMY    . BREAST LUMPECTOMY    . CESAREAN SECTION    . GASTRIC BYPASS    . TUBAL LIGATION      Medical History: Past Medical History:  Diagnosis Date  . Arthritis   . Collagen vascular disease (HDeputy   . Depression   . Heart valve problem    per grandaughter need surgery but they told her may not survive;  .Marland KitchenHypertension   . Hypothyroidism   . Lupus   . Neuromuscular disorder (HEbony   . Thyroid disease     Family History: History reviewed. No pertinent family history.  Social History: Social History   Socioeconomic History  . Marital status: Divorced    Spouse name: Not on file  . Number of children: Not on file  . Years of education: Not on file  . Highest education level: Not on file  Social Needs  . Financial resource strain: Not on file  . Food insecurity - worry: Not on file  . Food insecurity - inability: Not on file  . Transportation needs - medical: Not on file  . Transportation needs - non-medical: Not on file  Occupational History  . Not on file  Tobacco Use  . Smoking status: Never Smoker  . Smokeless tobacco: Never Used  Substance and Sexual Activity  . Alcohol use: No  . Drug use: No  . Sexual activity: No    Birth control/protection: Abstinence  Other Topics Concern  . Not on file  Social History Narrative  . Not on file    Vital Signs: Blood pressure 131/68, pulse 80, resp. rate 16, height 5' 2"  (1.575 m), weight 199 lb 9.6 oz (90.5 kg), SpO2 94 %.  Examination: General Appearance: The patient is well-developed, well-nourished, and in no distress. Skin: Gross inspection of skin unremarkable. Head: normocephalic, no gross deformities. Eyes: no gross deformities noted. ENT: ears appear grossly normal no exudates. Neck: Supple. No thyromegaly. No LAD. Respiratory: clear. Cardiovascular: Normal S1 and S2 without murmur or rub. Extremities: No cyanosis. pulses are equal. Neurologic: Alert and oriented. No  involuntary movements.  LABS: Recent Results (from the past 2160 hour(s))  Lactic acid, plasma     Status: None   Collection Time: 04/15/17  8:40 AM  Result Value Ref Range   Lactic Acid, Venous 1.1 0.5 - 1.9 mmol/L  Comprehensive metabolic panel     Status: Abnormal   Collection Time: 04/15/17  8:40 AM  Result Value Ref Range   Sodium 140 135 - 145 mmol/L   Potassium 4.4 3.5 - 5.1 mmol/L   Chloride 107 101 - 111 mmol/L   CO2 20 (L) 22 - 32 mmol/L   Glucose, Bld 110 (H) 65 - 99 mg/dL   BUN 18 6 - 20 mg/dL   Creatinine, Ser 0.87 0.44 - 1.00 mg/dL   Calcium 8.5 (L) 8.9 -  10.3 mg/dL   Total Protein 6.7 6.5 - 8.1 g/dL   Albumin 3.5 3.5 - 5.0 g/dL   AST 56 (H) 15 - 41 U/L   ALT 23 14 - 54 U/L   Alkaline Phosphatase 75 38 - 126 U/L   Total Bilirubin 0.5 0.3 - 1.2 mg/dL   GFR calc non Af Amer >60 >60 mL/min   GFR calc Af Amer >60 >60 mL/min    Comment: (NOTE) The eGFR has been calculated using the CKD EPI equation. This calculation has not been validated in all clinical situations. eGFR's persistently <60 mL/min signify possible Chronic Kidney Disease.    Anion gap 13 5 - 15  Lipase, blood     Status: None   Collection Time: 04/15/17  8:40 AM  Result Value Ref Range   Lipase 17 11 - 51 U/L  Troponin I     Status: None   Collection Time: 04/15/17  8:40 AM  Result Value Ref Range   Troponin I <0.03 <0.03 ng/mL  CBC WITH DIFFERENTIAL     Status: Abnormal   Collection Time: 04/15/17  8:40 AM  Result Value Ref Range   WBC 8.7 3.6 - 11.0 K/uL   RBC 3.84 3.80 - 5.20 MIL/uL   Hemoglobin 9.8 (L) 12.0 - 16.0 g/dL   HCT 30.4 (L) 35.0 - 47.0 %   MCV 79.1 (L) 80.0 - 100.0 fL   MCH 25.5 (L) 26.0 - 34.0 pg   MCHC 32.3 32.0 - 36.0 g/dL   RDW 15.5 (H) 11.5 - 14.5 %   Platelets 192 150 - 440 K/uL   Neutrophils Relative % 85 %   Neutro Abs 7.4 (H) 1.4 - 6.5 K/uL   Lymphocytes Relative 7 %   Lymphs Abs 0.6 (L) 1.0 - 3.6 K/uL   Monocytes Relative 7 %   Monocytes Absolute 0.6 0.2 - 0.9  K/uL   Eosinophils Relative 0 %   Eosinophils Absolute 0.0 0 - 0.7 K/uL   Basophils Relative 1 %   Basophils Absolute 0.0 0 - 0.1 K/uL  Blood Culture (routine x 2)     Status: None   Collection Time: 04/15/17  8:40 AM  Result Value Ref Range   Specimen Description BLOOD    Special Requests NONE    Culture NO GROWTH 5 DAYS    Report Status 04/20/2017 FINAL   Urinalysis, Routine w reflex microscopic     Status: Abnormal   Collection Time: 04/15/17  8:40 AM  Result Value Ref Range   Color, Urine YELLOW (A) YELLOW   APPearance CLEAR (A) CLEAR   Specific Gravity, Urine 1.014 1.005 - 1.030   pH 5.0 5.0 - 8.0   Glucose, UA NEGATIVE NEGATIVE mg/dL   Hgb urine dipstick NEGATIVE NEGATIVE   Bilirubin Urine NEGATIVE NEGATIVE   Ketones, ur NEGATIVE NEGATIVE mg/dL   Protein, ur NEGATIVE NEGATIVE mg/dL   Nitrite NEGATIVE NEGATIVE   Leukocytes, UA NEGATIVE NEGATIVE  Protime-INR     Status: None   Collection Time: 04/15/17  8:40 AM  Result Value Ref Range   Prothrombin Time 13.0 11.4 - 15.2 seconds   INR 0.99   CK     Status: Abnormal   Collection Time: 04/15/17  8:40 AM  Result Value Ref Range   Total CK 1,418 (H) 38 - 234 U/L  Blood Culture (routine x 2)     Status: None   Collection Time: 04/15/17  8:45 AM  Result Value Ref Range   Specimen Description  BLOOD    Special Requests NONE    Culture NO GROWTH 5 DAYS    Report Status 04/20/2017 FINAL   Influenza panel by PCR (type A & B)     Status: None   Collection Time: 04/15/17  8:50 AM  Result Value Ref Range   Influenza A By PCR NEGATIVE NEGATIVE   Influenza B By PCR NEGATIVE NEGATIVE    Comment: (NOTE) The Xpert Xpress Flu assay is intended as an aid in the diagnosis of  influenza and should not be used as a sole basis for treatment.  This  assay is FDA approved for nasopharyngeal swab specimens only. Nasal  washings and aspirates are unacceptable for Xpert Xpress Flu testing.   MRSA PCR Screening     Status: None   Collection  Time: 04/15/17  2:36 PM  Result Value Ref Range   MRSA by PCR NEGATIVE NEGATIVE    Comment:        The GeneXpert MRSA Assay (FDA approved for NASAL specimens only), is one component of a comprehensive MRSA colonization surveillance program. It is not intended to diagnose MRSA infection nor to guide or monitor treatment for MRSA infections.   HIV antibody (Routine Testing)     Status: None   Collection Time: 04/16/17  3:26 AM  Result Value Ref Range   HIV Screen 4th Generation wRfx Non Reactive Non Reactive    Comment: (NOTE) Performed At: [facility] [place], [name] [id number] Rush Farmer MD CW:[id number]   Basic metabolic panel     Status: Abnormal   Collection Time: 04/16/17  3:26 AM  Result Value Ref Range   Sodium 141 135 - 145 mmol/L   Potassium 4.0 3.5 - 5.1 mmol/L   Chloride 111 101 - 111 mmol/L   CO2 23 22 - 32 mmol/L   Glucose, Bld 138 (H) 65 - 99 mg/dL   BUN 19 6 - 20 mg/dL   Creatinine, Ser 0.57 0.44 - 1.00 mg/dL   Calcium 7.9 (L) 8.9 - 10.3 mg/dL   GFR calc non Af Amer >60 >60 mL/min   GFR calc Af Amer >60 >60 mL/min    Comment: (NOTE) The eGFR has been calculated using the CKD EPI equation. This calculation has not been validated in all clinical situations. eGFR's persistently <60 mL/min signify possible Chronic Kidney Disease.    Anion gap 7 5 - 15  CBC     Status: Abnormal   Collection Time: 04/16/17  3:26 AM  Result Value Ref Range   WBC 10.0 3.6 - 11.0 K/uL   RBC 3.42 (L) 3.80 - 5.20 MIL/uL   Hemoglobin 8.5 (L) 12.0 - 16.0 g/dL   HCT 27.2 (L) 35.0 - 47.0 %   MCV 79.6 (L) 80.0 - 100.0 fL   MCH 24.7 (L) 26.0 - 34.0 pg   MCHC 31.1 (L) 32.0 - 36.0 g/dL   RDW 15.5 (H) 11.5 - 14.5 %   Platelets 194 150 - 440 K/uL  CK     Status: Abnormal   Collection Time: 04/16/17  3:26 AM  Result Value Ref Range   Total CK 1,601 (H) 38 - 234 U/L  Glucose, capillary     Status: Abnormal   Collection Time: 04/16/17  7:49 AM  Result  Value Ref Range   Glucose-Capillary 113 (H) 65 - 99 mg/dL   Comment 1 Notify RN   CBC     Status: Abnormal   Collection Time: 04/17/17  5:23 AM  Result Value Ref Range   WBC 9.0 3.6 - 11.0 K/uL   RBC 3.42 (L) 3.80 - 5.20 MIL/uL   Hemoglobin 8.5 (L) 12.0 - 16.0 g/dL   HCT 27.0 (L) 35.0 - 47.0 %   MCV 78.9 (L) 80.0 - 100.0 fL   MCH 24.9 (L) 26.0 - 34.0 pg   MCHC 31.5 (L) 32.0 - 36.0 g/dL   RDW 15.9 (H) 11.5 - 14.5 %   Platelets 196 150 - 440 K/uL  Basic metabolic panel     Status: Abnormal   Collection Time: 04/17/17  5:23 AM  Result Value Ref Range   Sodium 141 135 - 145 mmol/L   Potassium 4.2 3.5 - 5.1 mmol/L   Chloride 110 101 - 111 mmol/L   CO2 26 22 - 32 mmol/L   Glucose, Bld 93 65 - 99 mg/dL   BUN 13 6 - 20 mg/dL   Creatinine, Ser 0.48 0.44 - 1.00 mg/dL   Calcium 8.3 (L) 8.9 - 10.3 mg/dL   GFR calc non Af Amer >60 >60 mL/min   GFR calc Af Amer >60 >60 mL/min    Comment: (NOTE) The eGFR has been calculated using the CKD EPI equation. This calculation has not been validated in all clinical situations. eGFR's persistently <60 mL/min signify possible Chronic Kidney Disease.    Anion gap 5 5 - 15  CK     Status: Abnormal   Collection Time: 04/17/17  5:23 AM  Result Value Ref Range   Total CK 681 (H) 38 - 234 U/L  Glucose, capillary     Status: None   Collection Time: 04/17/17  8:08 AM  Result Value Ref Range   Glucose-Capillary 86 65 - 99 mg/dL   Comment 1 Notify RN   Glucose, capillary     Status: None   Collection Time: 04/18/17  8:05 AM  Result Value Ref Range   Glucose-Capillary 79 65 - 99 mg/dL  Glucose, capillary     Status: Abnormal   Collection Time: 04/20/17  7:28 AM  Result Value Ref Range   Glucose-Capillary 147 (H) 65 - 99 mg/dL  Glucose, capillary     Status: Abnormal   Collection Time: 04/21/17  7:17 AM  Result Value Ref Range   Glucose-Capillary 116 (H) 65 - 99 mg/dL  Glucose, capillary     Status: None   Collection Time: 04/21/17  4:35 PM   Result Value Ref Range   Glucose-Capillary 77 65 - 99 mg/dL   Comment 1 Notify RN   Glucose, capillary     Status: None   Collection Time: 04/21/17  9:54 PM  Result Value Ref Range   Glucose-Capillary 92 65 - 99 mg/dL   Comment 1 Notify RN   Creatinine, serum     Status: None   Collection Time: 04/22/17  5:02 AM  Result Value Ref Range   Creatinine, Ser 0.60 0.44 - 1.00 mg/dL   GFR calc non Af Amer >60 >60 mL/min   GFR calc Af Amer >60 >60 mL/min    Comment: (NOTE) The eGFR has been calculated using the CKD EPI equation. This calculation has not been validated in all clinical situations. eGFR's persistently <60 mL/min signify possible Chronic Kidney Disease.   Glucose, capillary     Status: None   Collection Time: 04/22/17  7:47 AM  Result Value Ref Range   Glucose-Capillary 88 65 - 99 mg/dL   Comment 1 Notify RN   Glucose, capillary     Status:  None   Collection Time: 04/23/17  7:35 AM  Result Value Ref Range   Glucose-Capillary 97 65 - 99 mg/dL  Gastrointestinal Panel by PCR , Stool     Status: None   Collection Time: 05/11/17  8:40 AM  Result Value Ref Range   Campylobacter species NOT DETECTED NOT DETECTED   Plesimonas shigelloides NOT DETECTED NOT DETECTED   Salmonella species NOT DETECTED NOT DETECTED   Yersinia enterocolitica NOT DETECTED NOT DETECTED   Vibrio species NOT DETECTED NOT DETECTED   Vibrio cholerae NOT DETECTED NOT DETECTED   Enteroaggregative E coli (EAEC) NOT DETECTED NOT DETECTED   Enteropathogenic E coli (EPEC) NOT DETECTED NOT DETECTED   Enterotoxigenic E coli (ETEC) NOT DETECTED NOT DETECTED   Shiga like toxin producing E coli (STEC) NOT DETECTED NOT DETECTED   Shigella/Enteroinvasive E coli (EIEC) NOT DETECTED NOT DETECTED   Cryptosporidium NOT DETECTED NOT DETECTED   Cyclospora cayetanensis NOT DETECTED NOT DETECTED   Entamoeba histolytica NOT DETECTED NOT DETECTED   Giardia lamblia NOT DETECTED NOT DETECTED   Adenovirus F40/41 NOT DETECTED  NOT DETECTED   Astrovirus NOT DETECTED NOT DETECTED   Norovirus GI/GII NOT DETECTED NOT DETECTED   Rotavirus A NOT DETECTED NOT DETECTED   Sapovirus (I, II, IV, and V) NOT DETECTED NOT DETECTED    Comment: Performed at [facility], [address] [place], [place] [address]  C difficile quick scan w PCR reflex     Status: None   Collection Time: 05/11/17  8:40 AM  Result Value Ref Range   C Diff antigen NEGATIVE NEGATIVE   C Diff toxin NEGATIVE NEGATIVE   C Diff interpretation No C. difficile detected.     Comment: Performed at [facility], [address] [place], [place] [address]    Radiology: Dg Chest 2 View  Result Date: 05/16/2017 CLINICAL DATA:  Pt states she was diagnosed with pneumonia last week. Pt still has cough congestion and weakness. History of heart valve disease, HTN, lupus. shielded EXAM: CHEST  2 VIEW COMPARISON:  04/17/2017 FINDINGS: Heart size is upper limits normal. Lungs are free of focal consolidations and pleural effusions. There is mild perihilar peribronchial thickening. There has been some improvement in aeration since previous exams. No pulmonary edema. Remote rib fractures. IMPRESSION: 1. Bronchitic changes. 2. Improved aeration since prior studies.  No focal consolidations. Electronically Signed   By: Nolon Nations M.D.   On: 05/16/2017 15:51    No results found.  Dg Chest 2 View  Result Date: 05/16/2017 CLINICAL DATA:  Pt states she was diagnosed with pneumonia last week. Pt still has cough congestion and weakness. History of heart valve disease, HTN, lupus. shielded EXAM: CHEST  2 VIEW COMPARISON:  04/17/2017 FINDINGS: Heart size is upper limits normal. Lungs are free of focal consolidations and pleural effusions. There is mild perihilar peribronchial thickening. There has been some improvement in aeration since previous exams. No pulmonary edema. Remote rib fractures. IMPRESSION: 1. Bronchitic changes. 2. Improved aeration since  prior studies.  No focal consolidations. Electronically Signed   By: Nolon Nations M.D.   On: 05/16/2017 15:51      Assessment and Plan: Patient Active Problem List   Diagnosis Date Noted  . Left anterior knee pain 10/24/2016  . Right upper quadrant pain 10/24/2016  . Urination pain 06/26/2016  . DDD (degenerative disc disease), lumbar 05/14/2016  . Lipoma of skin 05/14/2016  . Vitamin D deficiency, unspecified 05/14/2016  . Major neurocognitive disorder, due  to vascular disease, with behavioral disturbance, mild 04/15/2016  . Severe recurrent major depression with psychotic features ([place]) 04/04/2016  . Lupus 04/04/2016  . GERD (gastroesophageal reflux disease) 04/04/2016  . COPD (chronic obstructive pulmonary disease) ([place]) 04/04/2016  . Acute respiratory failure with hypoxia ([place]) 04/03/2016  . Encephalopathy, metabolic 94/01/8285  . Hypokalemia 04/03/2016  . Pain in limb 04/03/2016  . Hypothyroidism 04/03/2016  . Essential hypertension 04/03/2016  . Intentional opiate overdose ([place]) 03/30/2016  . Age related osteoporosis 03/19/2016  . Chronic midline low back pain without sciatica 03/19/2016  . Encounter for long-term (current) use of high-risk medication 03/19/2016  . Primary osteoarthritis of both knees 03/19/2016    1. OSA she will need to be setup on CPAP and I would suggest that she have a autotitration done first 2. SOB at baseline now will monitor  General Counseling: I have discussed the findings of the evaluation and examination with Alyzae.  I have also discussed any further diagnostic evaluation thatmay be needed or ordered today. Shadonna verbalizes understanding of the findings of todays visit. We also reviewed her medications today and discussed drug interactions and side effects including but not limited excessive drowsiness and altered mental states. We also discussed that there is always a risk not just to her but also people around her. she has been encouraged  to call the office with any questions or concerns that should arise related to todays visit.    Time spent: 43mn  I have personally obtained a history, examined the patient, evaluated laboratory and imaging results, formulated the assessment and plan and placed orders.    SAllyne Gee MD [facility]id[facility] LL[facility]ulmonary and Critical Care Sleep medicine

## 2017-06-13 NOTE — Patient Instructions (Signed)

## 2017-07-01 ENCOUNTER — Ambulatory Visit: Payer: Medicare Other | Admitting: Anesthesiology

## 2017-07-01 ENCOUNTER — Encounter
Admission: RE | Disposition: A | Discharge status: Home / Self Care | Payer: Self-pay | Source: Ambulatory Visit | Attending: Unknown Physician Specialty

## 2017-07-01 ENCOUNTER — Encounter: Payer: Self-pay | Admitting: *Deleted

## 2017-07-01 ENCOUNTER — Ambulatory Visit
Admission: RE | Admit: 2017-07-01 | Discharge: 2017-07-01 | Disposition: A | Discharge status: Home / Self Care | Payer: Medicare Other | Source: Ambulatory Visit | Attending: Unknown Physician Specialty | Admitting: Unknown Physician Specialty

## 2017-07-01 DIAGNOSIS — Z1211 Encounter for screening for malignant neoplasm of colon: Secondary | ICD-10-CM | POA: Insufficient documentation

## 2017-07-01 DIAGNOSIS — Z86010 Personal history of colonic polyps: Secondary | ICD-10-CM | POA: Diagnosis not present

## 2017-07-01 DIAGNOSIS — K219 Gastro-esophageal reflux disease without esophagitis: Secondary | ICD-10-CM | POA: Diagnosis present

## 2017-07-01 DIAGNOSIS — Z79890 Hormone replacement therapy (postmenopausal): Secondary | ICD-10-CM | POA: Diagnosis not present

## 2017-07-01 DIAGNOSIS — G709 Myoneural disorder, unspecified: Secondary | ICD-10-CM | POA: Diagnosis not present

## 2017-07-01 DIAGNOSIS — M329 Systemic lupus erythematosus, unspecified: Secondary | ICD-10-CM | POA: Insufficient documentation

## 2017-07-01 DIAGNOSIS — Z791 Long term (current) use of non-steroidal anti-inflammatories (NSAID): Secondary | ICD-10-CM | POA: Insufficient documentation

## 2017-07-01 DIAGNOSIS — Z9884 Bariatric surgery status: Secondary | ICD-10-CM | POA: Diagnosis not present

## 2017-07-01 DIAGNOSIS — Z7983 Long term (current) use of bisphosphonates: Secondary | ICD-10-CM | POA: Insufficient documentation

## 2017-07-01 DIAGNOSIS — I10 Essential (primary) hypertension: Secondary | ICD-10-CM | POA: Diagnosis not present

## 2017-07-01 DIAGNOSIS — J449 Chronic obstructive pulmonary disease, unspecified: Secondary | ICD-10-CM | POA: Insufficient documentation

## 2017-07-01 DIAGNOSIS — K640 First degree hemorrhoids: Secondary | ICD-10-CM | POA: Insufficient documentation

## 2017-07-01 DIAGNOSIS — E039 Hypothyroidism, unspecified: Secondary | ICD-10-CM | POA: Insufficient documentation

## 2017-07-01 DIAGNOSIS — F329 Major depressive disorder, single episode, unspecified: Secondary | ICD-10-CM | POA: Insufficient documentation

## 2017-07-01 DIAGNOSIS — Z79899 Other long term (current) drug therapy: Secondary | ICD-10-CM | POA: Insufficient documentation

## 2017-07-01 HISTORY — DX: Vitamin D deficiency, unspecified: E55.9

## 2017-07-01 HISTORY — PX: COLONOSCOPY WITH PROPOFOL: SHX5780

## 2017-07-01 HISTORY — PX: ESOPHAGOGASTRODUODENOSCOPY (EGD) WITH PROPOFOL: SHX5813

## 2017-07-01 HISTORY — DX: Other intervertebral disc degeneration, lumbar region: M51.36

## 2017-07-01 SURGERY — COLONOSCOPY WITH PROPOFOL
Anesthesia: General

## 2017-07-01 MED ORDER — PROPOFOL 10 MG/ML IV BOLUS
INTRAVENOUS | Status: AC
  Filled 2017-07-01: qty 20

## 2017-07-01 MED ORDER — SODIUM CHLORIDE 0.9 % IV SOLN: INTRAVENOUS | Status: DC

## 2017-07-01 MED ORDER — PROPOFOL 10 MG/ML IV BOLUS
INTRAVENOUS | Status: DC | PRN
  Administered 2017-07-01 (×2): 20 mg via INTRAVENOUS
  Administered 2017-07-01 (×2): 30 mg via INTRAVENOUS

## 2017-07-01 MED ORDER — MIDAZOLAM HCL 2 MG/2ML IJ SOLN
INTRAMUSCULAR | Status: DC | PRN
  Administered 2017-07-01: 1 mg via INTRAVENOUS

## 2017-07-01 MED ORDER — FENTANYL CITRATE (PF) 100 MCG/2ML IJ SOLN
INTRAMUSCULAR | Status: DC | PRN
  Administered 2017-07-01: 50 ug via INTRAVENOUS

## 2017-07-01 MED ORDER — PROPOFOL 500 MG/50ML IV EMUL
INTRAVENOUS | Status: DC | PRN
  Administered 2017-07-01: 150 ug/kg/min via INTRAVENOUS

## 2017-07-01 MED ORDER — FENTANYL CITRATE (PF) 100 MCG/2ML IJ SOLN
INTRAMUSCULAR | Status: AC
Start: 2017-07-01 — End: 2017-07-01
  Filled 2017-07-01: qty 2

## 2017-07-01 MED ORDER — SODIUM CHLORIDE 0.9 % IV SOLN
INTRAVENOUS | Status: DC
  Administered 2017-07-01: 10:00:00 via INTRAVENOUS

## 2017-07-01 MED ORDER — PROPOFOL 500 MG/50ML IV EMUL
INTRAVENOUS | Status: AC
  Filled 2017-07-01: qty 50

## 2017-07-01 MED ORDER — LIDOCAINE HCL (CARDIAC) 20 MG/ML IV SOLN
INTRAVENOUS | Status: DC | PRN
  Administered 2017-07-01: 100 mg via INTRAVENOUS

## 2017-07-01 MED ORDER — IPRATROPIUM-ALBUTEROL 0.5-2.5 (3) MG/3ML IN SOLN
3.0000 mL | Freq: Once | RESPIRATORY_TRACT | Status: AC
  Administered 2017-07-01: 3 mL via RESPIRATORY_TRACT

## 2017-07-01 MED ORDER — PHENYLEPHRINE HCL 10 MG/ML IJ SOLN
INTRAMUSCULAR | Status: DC | PRN
  Administered 2017-07-01: 100 ug via INTRAVENOUS

## 2017-07-01 MED ORDER — MIDAZOLAM HCL 2 MG/2ML IJ SOLN
INTRAMUSCULAR | Status: AC
  Filled 2017-07-01: qty 2

## 2017-07-01 MED ORDER — IPRATROPIUM-ALBUTEROL 0.5-2.5 (3) MG/3ML IN SOLN
RESPIRATORY_TRACT | Status: AC
  Administered 2017-07-01: 3 mL via RESPIRATORY_TRACT
  Filled 2017-07-01: qty 3

## 2017-07-01 NOTE — H&P (Signed)
Primary Care Physician:  Theotis Burrow, MD Primary Gastroenterologist:  Dr. Vira Agar  Pre-Procedure History & Physical: HPI:  Shelly Freeman is a 67 y.o. female is here for an endoscopy and colonoscopy. Done for [facility] colon polyps and heartburn.   Past Medical History:  Diagnosis Date  . Arthritis   . Collagen vascular disease ([place])   . DDD (degenerative disc disease), lumbar 05/14/2016  . Depression   . Heart valve problem    per grandaughter need surgery but they told her may not survive;  [name] Hypertension   . Hypothyroidism   . Lupus   . Neuromuscular disorder ([place])   . Thyroid disease   . Vitamin D deficiency 05/14/2016    Past Surgical History:  Procedure Laterality Date  . APPENDECTOMY    . BREAST LUMPECTOMY Left   . CESAREAN SECTION    . GASTRIC BYPASS    . TUBAL LIGATION      Prior to Admission medications   Medication Sig Start Date End Date Taking? Authorizing Provider  alendronate (FOSAMAX) 70 MG tablet Take 70 mg by mouth once a week. Take with a full glass of water on an empty stomach.   Yes [provider]  B Complex Vitamins (B COMPLEX PO) Take by mouth.   Yes [provider]  b complex vitamins tablet Take 1 tablet by mouth daily.   Yes [provider]  celecoxib (CELEBREX) 200 MG capsule Take by mouth.   Yes [provider]  DULoxetine (CYMBALTA) 60 MG capsule Take 1 capsule (60 mg total) by mouth daily. Patient taking differently: Take 30 mg by mouth daily.  04/17/16  Yes Pucilowska, Jolanta B, MD  folic acid (FOLVITE) 1 MG tablet Take 1 mg by mouth daily.   Yes [provider]  gabapentin (NEURONTIN) 800 MG tablet Take 800 mg by mouth 2 (two) times daily.   Yes [provider]  hydroxychloroquine (PLAQUENIL) 200 MG tablet Take 2 tablets (400 mg total) by mouth daily. Patient taking differently: Take 200 mg by mouth 2 (two) times daily.  04/04/16  Yes Theodoro Grist, MD  isosorbide mononitrate  (IMDUR) 30 MG 24 hr tablet Take 30 mg by mouth daily.   Yes [provider]  levothyroxine (SYNTHROID, LEVOTHROID) 75 MCG tablet Take 75 mcg by mouth daily before breakfast.   Yes [provider]  metoprolol succinate (TOPROL-XL) 25 MG 24 hr tablet Take 12.5 mg by mouth daily.   Yes [provider]  OLANZapine (ZYPREXA) 15 MG tablet Take 1 tablet (15 mg total) by mouth at bedtime. Patient taking differently: Take 20 mg by mouth at bedtime.  04/16/16  Yes Pucilowska, Jolanta B, MD  pantoprazole (PROTONIX) 40 MG tablet Take 40 mg by mouth daily.   Yes [provider]  traZODone (DESYREL) 100 MG tablet Take 100 mg by mouth at bedtime.   Yes [provider]  acetaminophen (TYLENOL) 325 MG tablet Take 650 mg by mouth every 6 (six) hours as needed.    [provider]  albuterol (PROVENTIL HFA;VENTOLIN HFA) 108 (90 Base) MCG/ACT inhaler Inhale 2 puffs into the lungs every 6 (six) hours as needed for wheezing or shortness of breath.    [provider]  diclofenac sodium (VOLTAREN) 1 % GEL Apply topically.    [provider]  docusate sodium (COLACE) 100 MG capsule Take 100 mg by mouth 2 (two) times daily.    [provider]  famotidine (PEPCID) 20 MG tablet Take 1  tablet (20 mg total) by mouth 2 (two) times daily. Patient taking differently: Take 20 mg by mouth daily.  04/16/16   Pucilowska, Herma Ard B, MD  ibuprofen (ADVIL,MOTRIN) 600 MG tablet Take 800 mg by mouth every 8 (eight) hours as needed (pain).    [provider]  ipratropium-albuterol (DUONEB) 0.5-2.5 (3) MG/3ML SOLN Take 3 mLs by nebulization every 6 (six) hours as needed (Wheezing). 04/23/17   Hillary Bow, MD  levofloxacin (LEVAQUIN) 750 MG tablet Take 1 tablet (750 mg total) by mouth daily. Patient not taking: Reported on 05/16/2017 04/23/17   Hillary Bow, MD  LORazepam (ATIVAN) 0.5 MG tablet Take 0.5 mg by mouth at bedtime.    [provider]  predniSONE (STERAPRED UNI-PAK 21 TAB) 10 MG (21) TBPK tablet 60mg  day 1 and taper 10 mg daily. 6 days Patient not taking: Reported on 05/16/2017 04/23/17   Hillary Bow, MD  sucralfate (CARAFATE) 1 g tablet Take by mouth. 05/10/17 08/08/17  [provider]    Allergies as of 04/18/2017  . (No Known Allergies)    History reviewed. No pertinent family history.  Social History   Socioeconomic History  . Marital status: Divorced    Spouse name: Not on file  . Number of children: Not on file  . Years of education: Not on file  . Highest education level: Not on file  Social Needs  . Financial resource strain: Not on file  . Food insecurity - worry: Not on file  . Food insecurity - inability: Not on file  . Transportation needs - medical: Not on file  . Transportation needs - non-medical: Not on file  Occupational History  . Not on file  Tobacco Use  . Smoking status: Never Smoker  . Smokeless tobacco: Never Used  Substance and Sexual Activity  . Alcohol use: No  . Drug use: No  . Sexual activity: No    Birth control/protection: Abstinence  Other Topics Concern  . Not on file  Social History Narrative  . Not on file    Review of Systems: See HPI, otherwise negative ROS  Physical Exam: BP 137/60   Pulse 68   Temp 97.9 F (36.6 C) (Tympanic)   Resp 16   Ht 5\' 2"  (1.575 m)   Wt 89.8 kg (198 lb)   BMI 36.21 kg/m  General:   Alert,  pleasant and cooperative in NAD Head:  Normocephalic and atraumatic. Neck:  Supple; no masses or thyromegaly. Lungs:  Clear throughout to auscultation.    Heart:  Regular rate and rhythm. Abdomen:  Soft, nontender and nondistended. Normal bowel sounds, without guarding, and without rebound.   Neurologic:  Alert and  oriented x4;  grossly normal neurologically.  Impression/Plan: Shelly Freeman is here for an endoscopy and colonoscopy to be performed for personal history of colon polyp and heartburn.  Risks, benefits,  limitations, and alternatives regarding  endoscopy and colonoscopy have been reviewed with the patient.  Questions have been answered.  All parties agreeable.   Gaylyn Cheers, MD  07/01/2017, 10:14 AM

## 2017-07-01 NOTE — Op Note (Signed)
[facility] Gastroenterology Patient Name: Shelly Freeman Procedure Date: 07/01/2017 9:59 AM MRN: [id number] Account #: [id number] Date of Birth: 19-Jul-1950 Admit Type: Outpatient Age: 67 Room: [facility] ENDO ROOM 3 Gender: Female Note Status: Finalized Procedure:            Colonoscopy Indications:          High risk colon cancer surveillance: Personal history                        of colonic polyps Providers:            Manya Silvas, MD Medicines:            Propofol per Anesthesia Complications:        No immediate complications. Procedure:            Pre-Anesthesia Assessment:                       - After reviewing the risks and benefits, the patient                        was deemed in satisfactory condition to undergo the                        procedure.                       After obtaining informed consent, the colonoscope was                        passed under direct vision. Throughout the procedure,                        the patient's blood pressure, pulse, and oxygen                        saturations were monitored continuously. The                        Colonoscope was introduced through the anus and                        advanced to the the cecum, identified by appendiceal                        orifice and ileocecal valve. The colonoscopy was                        somewhat difficult. Successful completion of the                        procedure was aided by lavage. Findings:      Internal hemorrhoids were found during endoscopy. The hemorrhoids were       small and Grade I (internal hemorrhoids that do not prolapse).      A moderate amount of semi-liquid stool was found in the sigmoid colon,       in the descending colon, in the transverse colon and in the ascending       colon, interfering with visualization. Lavage of the area was performed       using greater than 1200 mL of sterile water, resulting in clearance  with       fair  visualization.      Biopsies done of colon in descending, transverse and sigmoid to check       for microscopic colitis. Impression:           - Internal hemorrhoids.                       - Stool in the sigmoid colon, in the descending colon,                        in the transverse colon and in the ascending colon.                       - No specimens collected. Recommendation:       - Await pathology results. Manya Silvas, MD 07/01/2017 11:16:15 AM This report has been signed electronically. Number of Addenda: 0 Note Initiated On: 07/01/2017 9:59 AM Scope Withdrawal Time: 0 hours 8 minutes 13 seconds  Total Procedure Duration: 0 hours 28 minutes 36 seconds       [facility]

## 2017-07-01 NOTE — Anesthesia Post-op Follow-up Note (Signed)
Anesthesia QCDR form completed.        

## 2017-07-01 NOTE — Transfer of Care (Signed)
Immediate Anesthesia Transfer of Care Note  Patient: Shelly Freeman  Procedure(s) Performed: COLONOSCOPY WITH PROPOFOL (N/A ) ESOPHAGOGASTRODUODENOSCOPY (EGD) WITH PROPOFOL (N/A )  Patient Location: PACU  Anesthesia Type:General  Level of Consciousness: awake  Airway & Oxygen Therapy: Patient Spontanous Breathing and Patient connected to nasal cannula oxygen  Post-op Assessment: Report given to RN and Post -op Vital signs reviewed and stable  Post vital signs: Reviewed and stable  Last Vitals:  Vitals:   07/01/17 0954 07/01/17 1116  BP: 137/60   Pulse: 68   Resp: 16   Temp: 36.6 C (!) 36.1 C    Last Pain:  Vitals:   07/01/17 1116  TempSrc: Tympanic         Complications: No apparent anesthesia complications

## 2017-07-01 NOTE — Op Note (Signed)
[facility] Gastroenterology Patient Name: Shelly Freeman Procedure Date: 07/01/2017 10:01 AM MRN: [id number] Account #: [id number] Date of Birth: 03/13/1951 Admit Type: Outpatient Age: 67 Room: [facility] ENDO ROOM 3 Gender: Female Note Status: Finalized Procedure:            Upper GI endoscopy Indications:          Heartburn Providers:            Manya Silvas, MD Referring MD:         Dyke Maes. Mancheno Revelo (Referring MD) Medicines:            Propofol per Anesthesia Complications:        No immediate complications. Procedure:            Pre-Anesthesia Assessment:                       - After reviewing the risks and benefits, the patient                        was deemed in satisfactory condition to undergo the                        procedure.                       After obtaining informed consent, the endoscope was                        passed under direct vision. Throughout the procedure,                        the patient's blood pressure, pulse, and oxygen                        saturations were monitored continuously. The Endoscope                        was introduced through the mouth, and advanced to the                        efferent jejunal loop. The upper GI endoscopy was                        accomplished without difficulty. The patient tolerated                        the procedure well. Findings:      The Z-line was irregular and was found 36 cm from the incisors. The       stomach was very small due to previous surgery for weight reduction. No       ulcerations seen. a few clips seen in area of junction of small bowel       and stomach. No ulcerations seen. Jejunum looked good. Impression:           - Z-line irregular, 36 cm from the incisors.                       - No specimens collected. Recommendation:       - The findings and recommendations were discussed with  the patient. Manya Silvas, MD 07/01/2017  10:39:04 AM This report has been signed electronically. Number of Addenda: 0 Note Initiated On: 07/01/2017 10:01 AM      [facility]

## 2017-07-01 NOTE — Anesthesia Preprocedure Evaluation (Signed)
Anesthesia Evaluation  Patient identified by MRN, date of birth, ID band Patient awake    Reviewed: Allergy & Precautions, H&P , NPO status , Patient's Chart, lab work & pertinent test results  History of Anesthesia Complications Negative for: history of anesthetic complications  Airway Mallampati: III  TM Distance: <3 FB Neck ROM: limited    Dental  (+) Chipped, Poor Dentition, Implants, Missing   Pulmonary neg shortness of breath, COPD,           Cardiovascular Exercise Tolerance: Good hypertension, (-) angina(-) Past MI and (-) DOE      Neuro/Psych PSYCHIATRIC DISORDERS Depression  Neuromuscular disease    GI/Hepatic negative GI ROS, Neg liver ROS, GERD  Medicated and Controlled,  Endo/Other  Hypothyroidism   Renal/GU negative Renal ROS  negative genitourinary   Musculoskeletal  (+) Arthritis ,   Abdominal   Peds  Hematology negative hematology ROS (+)   Anesthesia Other Findings Past Medical History: No date: Arthritis No date: Collagen vascular disease ([place]) 05/14/2016: DDD (degenerative disc disease), lumbar No date: Depression No date: Heart valve problem     Comment:  per grandaughter need surgery but they told her may not               survive; No date: Hypertension No date: Hypothyroidism No date: Lupus No date: Neuromuscular disorder ([place]) No date: Thyroid disease 05/14/2016: Vitamin D deficiency  Past Surgical History: No date: APPENDECTOMY No date: BREAST LUMPECTOMY; Left No date: CESAREAN SECTION No date: GASTRIC BYPASS No date: TUBAL LIGATION  BMI    Body Mass Index:  36.21 kg/m      Reproductive/Obstetrics negative OB ROS                             Anesthesia Physical Anesthesia Plan  ASA: III  Anesthesia Plan: General   Post-op Pain Management:    Induction: Intravenous  PONV Risk Score and Plan: Propofol infusion and TIVA  Airway Management  Planned: Natural Airway and Nasal Cannula  Additional Equipment:   Intra-op Plan:   Post-operative Plan:   Informed Consent: I have reviewed the patients History and Physical, chart, labs and discussed the procedure including the risks, benefits and alternatives for the proposed anesthesia with the patient or authorized representative who has indicated his/her understanding and acceptance.   Dental Advisory Given  Plan Discussed with: Anesthesiologist, CRNA and Surgeon  Anesthesia Plan Comments: (Consent via interpreter   Patient consented for risks of anesthesia including but not limited to:  - adverse reactions to medications - risk of intubation if required - damage to teeth, lips or other oral mucosa - sore throat or hoarseness - Damage to heart, brain, lungs or loss of life  Patient voiced understanding.)        Anesthesia Quick Evaluation

## 2017-07-01 NOTE — Anesthesia Postprocedure Evaluation (Signed)
Anesthesia Post Note  Patient: Shelly Freeman  Procedure(s) Performed: COLONOSCOPY WITH PROPOFOL (N/A ) ESOPHAGOGASTRODUODENOSCOPY (EGD) WITH PROPOFOL (N/A )  Patient location during evaluation: Endoscopy Anesthesia Type: General Level of consciousness: awake and alert Pain management: pain level controlled Vital Signs Assessment: post-procedure vital signs reviewed and stable Respiratory status: spontaneous breathing, nonlabored ventilation, respiratory function stable and patient connected to nasal cannula oxygen Cardiovascular status: blood pressure returned to baseline and stable Postop Assessment: no apparent nausea or vomiting Anesthetic complications: no     Last Vitals:  Vitals:   07/01/17 0954 07/01/17 1116  BP: 137/60   Pulse: 68   Resp: 16   Temp: 36.6 C (!) 36.1 C    Last Pain:  Vitals:   07/01/17 1116  TempSrc: Tympanic                 [name]

## 2017-07-02 ENCOUNTER — Encounter: Payer: Self-pay | Admitting: Unknown Physician Specialty

## 2017-07-02 LAB — SURGICAL PATHOLOGY

## 2017-07-10 ENCOUNTER — Ambulatory Visit: Payer: Medicare Other

## 2017-07-10 DIAGNOSIS — G4733 Obstructive sleep apnea (adult) (pediatric): Secondary | ICD-10-CM

## 2017-07-10 NOTE — Progress Notes (Signed)
95 percentile pressure 5-15   95th percentile leak 0.00   apnea index 0.0 /hr  apnea-hypopnea index  0.0 /hr   total days used  >4 hr 0 days  total days used <4 hr 0 days  Total compliance 0 percent  This is a new cpap setup. She was seton auto pressure 5-15 cwp, with a resmed cpap s-10 with an EPR of 2 and using a resmed airfit F-20 full face mask small. She had a caregiver with her. I went thru compliance making sure to understand at minimum 5 hours per night but want the whole sleep including naps.Both had good understanding of using, cleaning and compliance of cpap

## 2017-07-16 DIAGNOSIS — M5412 Radiculopathy, cervical region: Secondary | ICD-10-CM | POA: Insufficient documentation

## 2017-07-17 ENCOUNTER — Other Ambulatory Visit: Payer: Self-pay | Admitting: Internal Medicine

## 2017-07-17 DIAGNOSIS — M5412 Radiculopathy, cervical region: Secondary | ICD-10-CM

## 2017-07-26 ENCOUNTER — Ambulatory Visit
Admission: RE | Admit: 2017-07-26 | Discharge: 2017-07-26 | Disposition: A | Discharge status: Home / Self Care | Payer: Medicare Other | Source: Ambulatory Visit | Attending: Internal Medicine | Admitting: Internal Medicine

## 2017-07-26 DIAGNOSIS — M47892 Other spondylosis, cervical region: Secondary | ICD-10-CM | POA: Insufficient documentation

## 2017-07-26 DIAGNOSIS — M5412 Radiculopathy, cervical region: Secondary | ICD-10-CM | POA: Diagnosis not present

## 2017-07-26 DIAGNOSIS — M4802 Spinal stenosis, cervical region: Secondary | ICD-10-CM | POA: Insufficient documentation

## 2017-07-31 DIAGNOSIS — G5601 Carpal tunnel syndrome, right upper limb: Secondary | ICD-10-CM | POA: Insufficient documentation

## 2017-08-14 ENCOUNTER — Ambulatory Visit: Payer: Self-pay

## 2017-08-28 ENCOUNTER — Ambulatory Visit: Payer: Self-pay

## 2017-09-10 ENCOUNTER — Other Ambulatory Visit: Payer: Self-pay | Admitting: Internal Medicine

## 2017-09-19 ENCOUNTER — Ambulatory Visit: Payer: Self-pay | Admitting: Internal Medicine

## 2017-10-21 ENCOUNTER — Ambulatory Visit: Payer: Self-pay | Admitting: Internal Medicine

## 2017-11-27 ENCOUNTER — Other Ambulatory Visit: Payer: Self-pay | Admitting: Family Medicine

## 2017-11-27 DIAGNOSIS — E2839 Other primary ovarian failure: Secondary | ICD-10-CM

## 2017-11-27 DIAGNOSIS — Z1231 Encounter for screening mammogram for malignant neoplasm of breast: Secondary | ICD-10-CM

## 2017-12-03 ENCOUNTER — Other Ambulatory Visit (INDEPENDENT_AMBULATORY_CARE_PROVIDER_SITE_OTHER): Payer: Medicare Other | Admitting: Internal Medicine

## 2017-12-03 DIAGNOSIS — G4710 Hypersomnia, unspecified: Secondary | ICD-10-CM | POA: Diagnosis not present

## 2017-12-09 ENCOUNTER — Ambulatory Visit (INDEPENDENT_AMBULATORY_CARE_PROVIDER_SITE_OTHER): Payer: Medicare Other | Admitting: Internal Medicine

## 2017-12-09 ENCOUNTER — Encounter: Payer: Self-pay | Admitting: Internal Medicine

## 2017-12-09 ENCOUNTER — Telehealth: Payer: Self-pay

## 2017-12-09 VITALS — BP 120/68 | HR 86 | Resp 16 | Ht 61.0 in | Wt 224.8 lb

## 2017-12-09 DIAGNOSIS — G4733 Obstructive sleep apnea (adult) (pediatric): Secondary | ICD-10-CM | POA: Diagnosis not present

## 2017-12-09 DIAGNOSIS — J4521 Mild intermittent asthma with (acute) exacerbation: Secondary | ICD-10-CM | POA: Diagnosis not present

## 2017-12-09 MED ORDER — AZITHROMYCIN 250 MG PO TABS: ORAL_TABLET | ORAL | 0 refills | Status: DC

## 2017-12-09 NOTE — Progress Notes (Signed)
[facility] [place], [place] [address]  Pulmonary Sleep Medicine   Office Visit Note  Patient Name: Shelly Freeman DOB: [birth year] MRN [id number]  Date of Service: 12/09/2017  Complaints/HPI: She has noted runny nose cough and congestion. She states that she has felt no fevers. She has been ill for about 1-2 days. She has noted a little bit of wheeze. She states she is not using her inhalers. She states that she forgets. Patient has history asthma controlled in the past. She ahs not been admitted to the hospital since her last visit  ROS  General: (-) fever, (-) chills, (-) night sweats, (-) weakness Skin: (-) rashes, (-) itching,. Eyes: (-) visual changes, (-) redness, (-) itching. Nose and Sinuses: (-) nasal stuffiness or itchiness, (-) postnasal drip, (-) nosebleeds, (-) sinus trouble. Mouth and Throat: (-) sore throat, (-) hoarseness. Neck: (-) swollen glands, (-) enlarged thyroid, (-) neck pain. Respiratory: + cough, (-) bloody sputum, + shortness of breath, + wheezing. Cardiovascular: - ankle swelling, (-) chest pain. Lymphatic: (-) lymph node enlargement. Neurologic: (-) numbness, (-) tingling. Psychiatric: (-) anxiety, (-) depression   Current Medication: Outpatient Encounter Medications as of 12/09/2017  Medication Sig Note  . acetaminophen (TYLENOL) 325 MG tablet Take 650 mg by mouth every 6 (six) hours as needed.   [name] albuterol (PROVENTIL HFA;VENTOLIN HFA) 108 (90 Base) MCG/ACT inhaler Inhale 2 puffs into the lungs every 6 (six) hours as needed for wheezing or shortness of breath.   [name] alendronate (FOSAMAX) 70 MG tablet Take 70 mg by mouth once a week. Take with a full glass of water on an empty stomach. 04/15/2017: Due for one today  . B Complex Vitamins (B COMPLEX PO) Take by mouth.   [name] b complex vitamins tablet Take 1 tablet by mouth daily.   . celecoxib (CELEBREX) 200 MG capsule Take by mouth.   . diclofenac sodium (VOLTAREN) 1 % GEL Apply  topically.   . docusate sodium (COLACE) 100 MG capsule Take 100 mg by mouth 2 (two) times daily.   . DULoxetine (CYMBALTA) 60 MG capsule Take 1 capsule (60 mg total) by mouth daily. (Patient taking differently: Take 30 mg by mouth daily. )   . famotidine (PEPCID) 20 MG tablet Take 1 tablet (20 mg total) by mouth 2 (two) times daily. (Patient taking differently: Take 20 mg by mouth daily. )   . folic acid (FOLVITE) 1 MG tablet Take 1 mg by mouth daily.   [name] gabapentin (NEURONTIN) 800 MG tablet Take 800 mg by mouth 2 (two) times daily.   . hydroxychloroquine (PLAQUENIL) 200 MG tablet Take 2 tablets (400 mg total) by mouth daily. (Patient taking differently: Take 200 mg by mouth 2 (two) times daily. )   . ibuprofen (ADVIL,MOTRIN) 600 MG tablet Take 800 mg by mouth every 8 (eight) hours as needed (pain).   [name] ipratropium-albuterol (DUONEB) 0.5-2.5 (3) MG/3ML SOLN Take 3 mLs by nebulization every 6 (six) hours as needed (Wheezing).   . isosorbide mononitrate (IMDUR) 30 MG 24 hr tablet Take 30 mg by mouth daily.   [name] levofloxacin (LEVAQUIN) 750 MG tablet Take 1 tablet (750 mg total) by mouth daily.   [name] levothyroxine (SYNTHROID, LEVOTHROID) 75 MCG tablet Take 75 mcg by mouth daily before breakfast.   . LORazepam (ATIVAN) 0.5 MG tablet Take 0.5 mg by mouth at bedtime.   . metoprolol succinate (TOPROL-XL) 25 MG 24 hr tablet Take 12.5 mg by mouth daily.   [name] OLANZapine (  ZYPREXA) 15 MG tablet Take 1 tablet (15 mg total) by mouth at bedtime. (Patient taking differently: Take 20 mg by mouth at bedtime. )   . pantoprazole (PROTONIX) 40 MG tablet Take 40 mg by mouth daily.   . traZODone (DESYREL) 100 MG tablet Take 100 mg by mouth at bedtime.   . sucralfate (CARAFATE) 1 g tablet Take by mouth.   . [DISCONTINUED] predniSONE (STERAPRED UNI-PAK 21 TAB) 10 MG (21) TBPK tablet 60mg  day 1 and taper 10 mg daily. 6 days (Patient not taking: Reported on 05/16/2017)    No facility-administered encounter medications on file as  of 12/09/2017.     Surgical History: Past Surgical History:  Procedure Laterality Date  . APPENDECTOMY    . BREAST LUMPECTOMY Left   . CESAREAN SECTION    . COLONOSCOPY WITH PROPOFOL N/A 07/01/2017   Procedure: COLONOSCOPY WITH PROPOFOL;  Surgeon: Manya Silvas, MD;  Location: [facility] ENDOSCOPY;  Service: Endoscopy;  Laterality: N/A;  . ESOPHAGOGASTRODUODENOSCOPY (EGD) WITH PROPOFOL N/A 07/01/2017   Procedure: ESOPHAGOGASTRODUODENOSCOPY (EGD) WITH PROPOFOL;  Surgeon: Manya Silvas, MD;  Location: [facility] ENDOSCOPY;  Service: Endoscopy;  Laterality: N/A;  . GASTRIC BYPASS    . TUBAL LIGATION      Medical History: Past Medical History:  Diagnosis Date  . Arthritis   . Collagen vascular disease ([place])   . DDD (degenerative disc disease), lumbar 05/14/2016  . Depression   . Heart valve problem    per grandaughter need surgery but they told her may not survive;  [name] Hypertension   . Hypothyroidism   . Lupus ([place])   . Neuromuscular disorder ([place])   . Thyroid disease   . Vitamin D deficiency 05/14/2016    Family History: Family History  Family history unknown: Yes    Social History: Social History   Socioeconomic History  . Marital status: Divorced    Spouse name: Not on file  . Number of children: Not on file  . Years of education: Not on file  . Highest education level: Not on file  Occupational History  . Not on file  Social Needs  . Financial resource strain: Not on file  . Food insecurity:    Worry: Not on file    Inability: Not on file  . Transportation needs:    Medical: Not on file    Non-medical: Not on file  Tobacco Use  . Smoking status: Never Smoker  . Smokeless tobacco: Never Used  Substance and Sexual Activity  . Alcohol use: No  . Drug use: No  . Sexual activity: Never    Birth control/protection: Abstinence  Lifestyle  . Physical activity:    Days per week: Not on file    Minutes per session: Not on file  . Stress: Not on file  Relationships   . Social connections:    Talks on phone: Not on file    Gets together: Not on file    Attends religious service: Not on file    Active member of club or organization: Not on file    Attends meetings of clubs or organizations: Not on file    Relationship status: Not on file  . Intimate partner violence:    Fear of current or ex partner: Not on file    Emotionally abused: Not on file    Physically abused: Not on file    Forced sexual activity: Not on file  Other Topics Concern  . Not on file  Social History Narrative  .  Not on file    Vital Signs: Blood pressure 120/68, pulse 86, resp. rate 16, height 5\' 1"  (1.549 m), weight 224 lb 12.8 oz (102 kg), SpO2 94 %.  Examination: General Appearance: The patient is well-developed, well-nourished, and in no distress. Skin: Gross inspection of skin unremarkable. Head: normocephalic, no gross deformities. Eyes: no gross deformities noted. ENT: ears appear grossly normal no exudates. Neck: Supple. No thyromegaly. No LAD. Respiratory: no rhonchi noted. Cardiovascular: Normal S1 and S2 without murmur or rub. Extremities: No cyanosis. pulses are equal. Neurologic: Alert and oriented. No involuntary movements.  LABS: No results found for this or any previous visit (from the past 2160 hour(s)).  Radiology: Ct Cervical Spine Wo Contrast  Result Date: 07/26/2017 CLINICAL DATA:  Cervicalgia with right-sided radicular symptoms EXAM: CT CERVICAL SPINE WITHOUT CONTRAST TECHNIQUE: Multidetector CT imaging of the cervical spine was performed without intravenous contrast. Multiplanar CT image reconstructions were also generated. COMPARISON:  Cervical spine CT April 15, 2017 FINDINGS: Alignment: There is no spondylolisthesis. Skull base and vertebrae: Skull base and craniocervical junction regions appear normal. No evident fracture. No blastic or lytic bone lesions. Soft tissues and spinal canal: Prevertebral soft tissues and predental space regions  are normal. No paraspinous lesions are evident. There is no cord or canal hematoma. Disc levels: There is slight disc space narrowing at C6-7 and C7-T1. At C2-3, there is slight facet hypertrophy bilaterally. There is no nerve root edema or effacement. At C3-4, there is mild facet hypertrophy bilaterally. No nerve root edema or effacement. No disc extrusion or stenosis. At C4-5, there is mild facet osteoarthritic change bilaterally. There is slight narrowing of the exiting nerve root at C4-5 on the right without nerve root edema or effacement. No nerve root edema or effacement is evident on either side at this level. No disc extrusion or stenosis. At C5-6, there is mild to moderate facet osteoarthritic change bilaterally. No nerve root edema or effacement. No disc extrusion or stenosis. At C6-7, there is moderate facet osteoarthritic change bilaterally. There is impression on the exiting nerve root on the right at this level due to bony hypertrophy. No frank effacement of this nerve root. No disc extrusion or stenosis is evident. At C7-T1, there is moderate facet bilaterally, slightly more on the left than on the right. No nerve root edema or effacement. No disc extrusion or stenosis. Upper chest: Visualized lung apices are clear. Other: None IMPRESSION: Relatively mild facet osteoarthritic change at multiple levels. There is exit foraminal narrowing on the right at C6-7 due to bony hypertrophy. This exit foraminal narrowing abuts but does not frankly efface the exiting nerve root on the right at C6-7. No disc extrusion or stenosis evident. No fracture or spondylolisthesis. Electronically Signed   By: Lowella Grip III M.D.   On: 07/26/2017 14:05    No results found.  No results found.    Assessment and Plan: Patient Active Problem List   Diagnosis Date Noted  . Left anterior knee pain 10/24/2016  . Right upper quadrant pain 10/24/2016  . Urination pain 06/26/2016  . DDD (degenerative disc  disease), lumbar 05/14/2016  . Lipoma of skin 05/14/2016  . Vitamin D deficiency, unspecified 05/14/2016  . Major neurocognitive disorder, due to vascular disease, with behavioral disturbance, mild 04/15/2016  . Severe recurrent major depression with psychotic features ([place]) 04/04/2016  . Lupus ([place]) 04/04/2016  . GERD (gastroesophageal reflux disease) 04/04/2016  . COPD (chronic obstructive pulmonary disease) ([place]) 04/04/2016  . Acute respiratory failure  with hypoxia ([place]) 04/03/2016  . Encephalopathy, metabolic 39/53/2023  . Hypokalemia 04/03/2016  . Pain in limb 04/03/2016  . Hypothyroidism 04/03/2016  . Essential hypertension 04/03/2016  . Intentional opiate overdose ([place]) 03/30/2016  . Age related osteoporosis 03/19/2016  . Chronic midline low back pain without sciatica 03/19/2016  . Encounter for long-term (current) use of high-risk medication 03/19/2016  . Primary osteoarthritis of both knees 03/19/2016    1. Chronic asthma with acute exacerbation. Patient states that she is not using inhalers as noted because she forgets to use the inhalers. Instructed on proper use of her inhalers. Will give her a Zpk at this time for acute bronchitis. Last PFT was within normal limits 2. OSA has mild OSAon the home study however we did a PSG and this had shown that she has normal AHI with no significant desaturations noted. Lowest SaO2 was 89% 3. Morbid obesity needs to work on weight loss. Discussed diet and exercise  General Counseling: I have discussed the findings of the evaluation and examination with Amand.  I have also discussed any further diagnostic evaluation thatmay be needed or ordered today. Shanitha verbalizes understanding of the findings of todays visit. We also reviewed her medications today and discussed drug interactions and side effects including but not limited excessive drowsiness and altered mental states. We also discussed that there is always a risk not just to her but also  people around her. she has been encouraged to call the office with any questions or concerns that should arise related to todays visit.    Time spent: 65min  I have personally obtained a history, examined the patient, evaluated laboratory and imaging results, formulated the assessment and plan and placed orders.    Allyne Gee, MD [facility] Pulmonary and Critical Care Sleep medicine

## 2017-12-09 NOTE — Telephone Encounter (Signed)
Shelly Freeman called they no longer pt phar I canceled zpak and also I left message to grand daughter for phar name so we can send zpak

## 2017-12-09 NOTE — Patient Instructions (Signed)

## 2017-12-12 ENCOUNTER — Encounter: Payer: Self-pay | Admitting: Internal Medicine

## 2017-12-12 NOTE — Procedures (Signed)
[facility] [address] [place], [place] [address]  Patient Name: Shelly Freeman DOB: 09/14/1950   SLEEP STUDY INTERPRETATION  DATE OF SERVICE: December 03, 2017   SLEEP STUDY HISTORY: This patient is referred to the sleep lab for a baseline Polysomnography. Pertinent history includes a history of diagnosis of excessive daytime somnolence and snoring.  PROCEDURE: This overnight polysomnogram was performed using the Alice 5 acquisition system using the standard diagnostic protocol as outlined by the AASM. This includes 6 channels of EEG, 2 channelscannels of EOG, chin EMG, bilateral anterior tibialis EMG, nasal/oral thermister, PTAF, chest and abdominal wall movements, ECG and pulse oximetry. Apneas and Hypopneas were scored per AASM definition.  SLEEP ARCHITECHTURE: This is a baseline polysomnograph  study. The total recording time was 415 minutes and the patients total sleep time is noted to be 239 minutes. Sleep onset latency was 55.5 minutes and is prolonged.  Stage R sleep was not noted. Sleep maintenance efficiency was 55.5 % and is decreased.  Sleep staging expressed as a percentage of total sleep time demonstrated 7.9 % N1, 68.6 % N2 and 23.4 % N3  sleep. Stage R represents 0 % of total sleep time. This is reduced.  There were a total of 7 arousals  for an overall arousal index of 1.8 per hour of sleep. PLMS arousal were not noted. Arousals without respiratory events are  noted. This can contribute to sleep architechture disruption.  RESPIRATORY MONITORING:   Patient exhibits in significant evidence of sleep disorderd breathing characterized by 1 central apneas, 0 obstructive apneas and 0 mixed apneas. There were 4 obstructive hypopneas and 4 RERAs. Most of the apneas/hypopneas were of mixed variety. The total apnea hypopnea index (apneas and hypopneas per hour of sleep) is 1.3/h respiratory events per hour and is within normal limits.  Respiratory monitoring demonstrated  severe snoring through the night. There are a total of 262 snoring episodes representing 46.8 % of sleep.   Baseline oxygen saturation during wakefulness was 95 % and during NREM sleep averaged 94 % through the night. There was mild significant  oxygen desaturation with the respiratory events. Arterial oxygen desaturation occurred of at least 4% was noted with a low saturation of 89%. The study was performed off oxygen.  CARDIAC MONITORING:   Average heart rate is 59 during sleep with a high of 69 beats per minute. Malignant arrhythmias were not noted.    IMPRESSIONS:  --This overnight polysomnogram demonstrates absence of significant obstructive sleep apnea with an overall AHI 1.3 per hour. --The overall AHI was no worse worse  during Stage R as there was no sleep stage R. --There were associated insignificant arterial oxygen desaturations noted down to 89% --There no significant PLMS noted in this study. --There is severe snoring noted throughout the study.    RECOMMENDATIONS:  --CPAP titration study is not indicated in this case. --Nasal decongestants and antihistamines may be of help for increased upper airways resistance when present. --Weight loss through dietary and lifestyle modification is recommended in the presence of obesity. --A search for and treatment of any underlying cardiopulmonary disease is      recommended in the presence of oxygen desaturations. --Alternative treatment options if the patient is not willing to use CPAP include oral   appliances as well as surgical intervention which may help in the appropriate patient. --Clinical correlation is recommended. Please feel free to call the office for any further  questions or assistance in the care of this patient.  Allyne Gee, MD [facility] Pulmonary Critical Care Medicine Sleep medicine

## 2018-01-01 ENCOUNTER — Other Ambulatory Visit: Payer: Medicaid Other

## 2018-01-15 ENCOUNTER — Ambulatory Visit
Admission: RE | Admit: 2018-01-15 | Discharge: 2018-01-15 | Disposition: A | Discharge status: Home / Self Care | Payer: Medicare Other | Source: Ambulatory Visit | Attending: Family Medicine | Admitting: Family Medicine

## 2018-01-15 DIAGNOSIS — Z1231 Encounter for screening mammogram for malignant neoplasm of breast: Secondary | ICD-10-CM | POA: Insufficient documentation

## 2018-01-15 DIAGNOSIS — E2839 Other primary ovarian failure: Secondary | ICD-10-CM | POA: Insufficient documentation

## 2018-02-04 ENCOUNTER — Encounter: Payer: Self-pay | Admitting: Internal Medicine

## 2018-02-04 NOTE — Progress Notes (Signed)
SCANNED IN PSG REPORT DONE ON 12/03/17.

## 2018-06-06 IMAGING — CR DG CHEST 2V
1 series · 2 of 2 positions shown · non-contrast
Comparison: 04/17/2017

CLINICAL DATA: Pt states she was diagnosed with pneumonia last
week. Pt still has cough congestion and weakness. History of heart
valve disease, HTN, lupus. shielded

EXAM:
CHEST  2 VIEW

[Series 1: dg chest 2 view · 0.14mm/px · 2 of 2 slices shown]
[im 1/2]
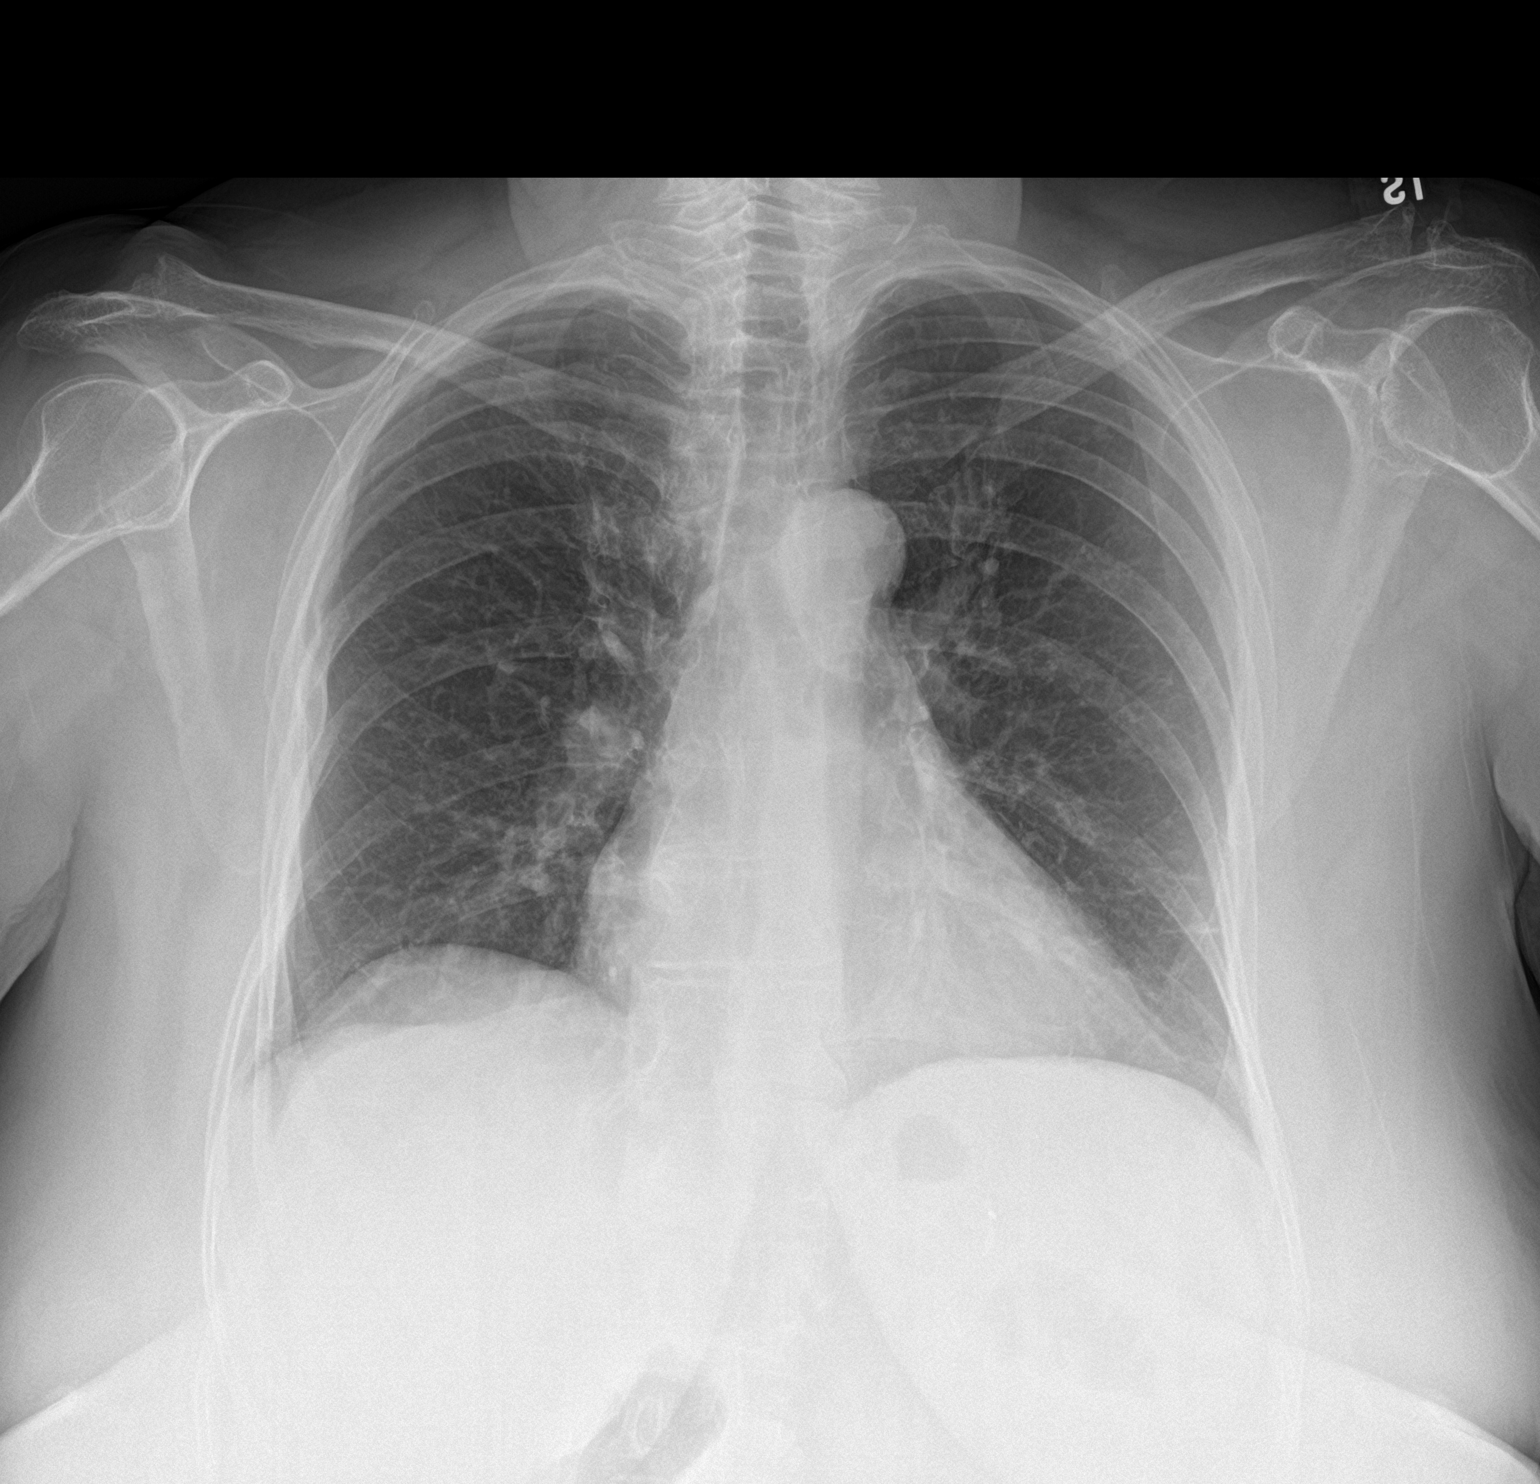
[im 2/2]
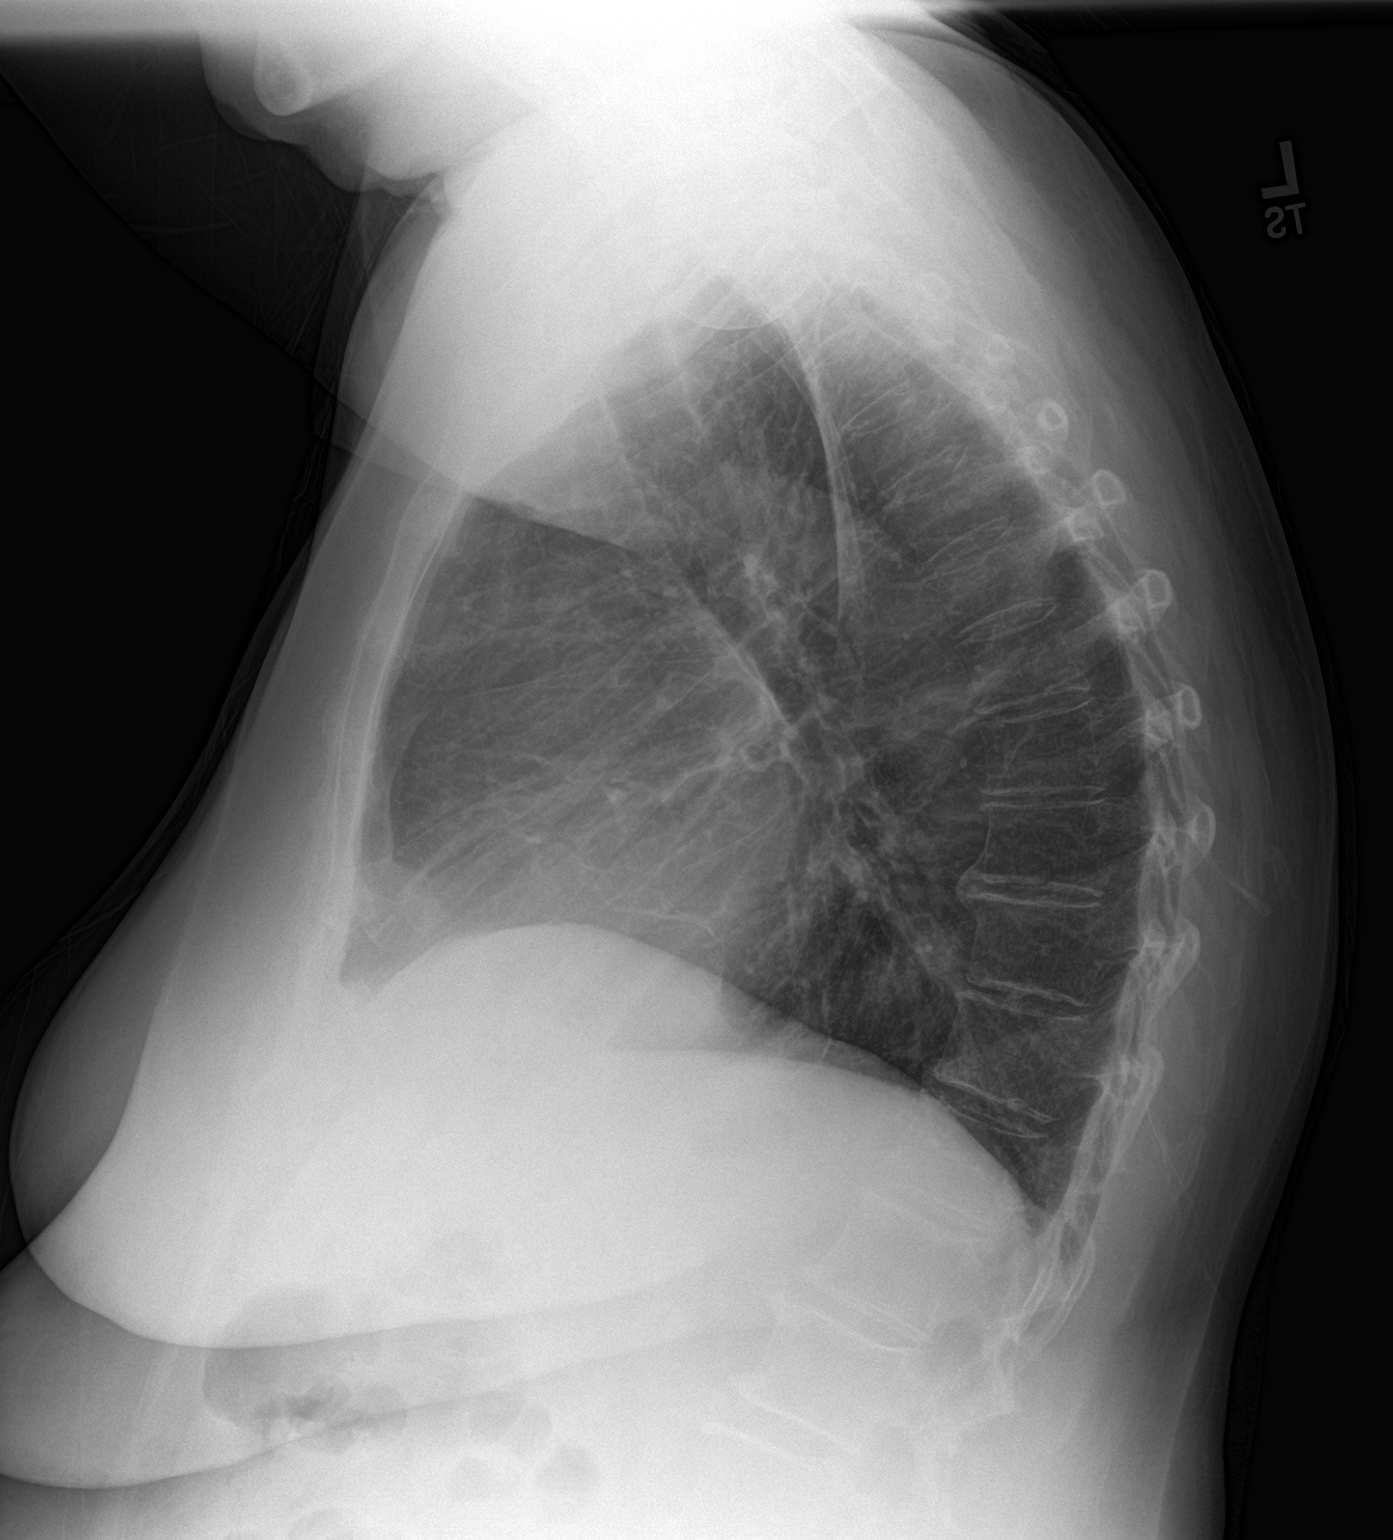

[2 of 2 positions shown; findings below may reference images not displayed]

FINDINGS: Heart size is upper limits normal. Lungs are free of focal
consolidations and pleural effusions. There is mild perihilar
peribronchial thickening. There has been some improvement in
aeration since previous exams. No pulmonary edema. Remote rib
fractures.
IMPRESSION: 1. Bronchitic changes.
2. Improved aeration since prior studies.  No focal consolidations.

## 2018-06-11 ENCOUNTER — Ambulatory Visit: Payer: Self-pay | Admitting: Adult Health

## 2018-06-25 ENCOUNTER — Ambulatory Visit: Payer: Medicare Other | Admitting: Adult Health

## 2018-10-29 DIAGNOSIS — D500 Iron deficiency anemia secondary to blood loss (chronic): Secondary | ICD-10-CM | POA: Insufficient documentation

## 2018-10-29 DIAGNOSIS — M159 Polyosteoarthritis, unspecified: Secondary | ICD-10-CM | POA: Insufficient documentation

## 2018-11-25 ENCOUNTER — Other Ambulatory Visit: Payer: Self-pay | Admitting: Student

## 2018-11-25 DIAGNOSIS — R109 Unspecified abdominal pain: Secondary | ICD-10-CM

## 2018-12-03 ENCOUNTER — Ambulatory Visit
Admission: RE | Admit: 2018-12-03 | Discharge: 2018-12-03 | Disposition: A | Discharge status: Home / Self Care | Payer: Medicare Other | Source: Ambulatory Visit | Attending: Student | Admitting: Student

## 2018-12-03 ENCOUNTER — Other Ambulatory Visit: Payer: Self-pay

## 2018-12-03 DIAGNOSIS — R109 Unspecified abdominal pain: Secondary | ICD-10-CM | POA: Diagnosis not present

## 2018-12-10 ENCOUNTER — Other Ambulatory Visit: Payer: Self-pay | Admitting: Student

## 2018-12-10 ENCOUNTER — Other Ambulatory Visit (HOSPITAL_COMMUNITY): Payer: Self-pay | Admitting: Student

## 2018-12-10 DIAGNOSIS — R1013 Epigastric pain: Secondary | ICD-10-CM

## 2018-12-11 ENCOUNTER — Telehealth (HOSPITAL_COMMUNITY): Payer: Self-pay

## 2019-01-28 ENCOUNTER — Inpatient Hospital Stay
Admission: EM | Admit: 2019-01-28 | Discharge: 2019-01-30 | DRG: 202 | Disposition: A | Discharge status: Home Health | Payer: Medicare Other | Attending: Specialist | Admitting: Specialist

## 2019-01-28 ENCOUNTER — Encounter: Payer: Self-pay | Admitting: *Deleted

## 2019-01-28 ENCOUNTER — Other Ambulatory Visit: Payer: Self-pay

## 2019-01-28 ENCOUNTER — Emergency Department: Payer: Medicare Other

## 2019-01-28 DIAGNOSIS — F329 Major depressive disorder, single episode, unspecified: Secondary | ICD-10-CM | POA: Diagnosis present

## 2019-01-28 DIAGNOSIS — Z23 Encounter for immunization: Secondary | ICD-10-CM

## 2019-01-28 DIAGNOSIS — F419 Anxiety disorder, unspecified: Secondary | ICD-10-CM | POA: Diagnosis present

## 2019-01-28 DIAGNOSIS — Z9884 Bariatric surgery status: Secondary | ICD-10-CM | POA: Diagnosis not present

## 2019-01-28 DIAGNOSIS — G629 Polyneuropathy, unspecified: Secondary | ICD-10-CM | POA: Diagnosis present

## 2019-01-28 DIAGNOSIS — Z20828 Contact with and (suspected) exposure to other viral communicable diseases: Secondary | ICD-10-CM | POA: Diagnosis present

## 2019-01-28 DIAGNOSIS — E039 Hypothyroidism, unspecified: Secondary | ICD-10-CM | POA: Diagnosis present

## 2019-01-28 DIAGNOSIS — Z79899 Other long term (current) drug therapy: Secondary | ICD-10-CM | POA: Diagnosis not present

## 2019-01-28 DIAGNOSIS — I10 Essential (primary) hypertension: Secondary | ICD-10-CM | POA: Diagnosis present

## 2019-01-28 DIAGNOSIS — J4541 Moderate persistent asthma with (acute) exacerbation: Secondary | ICD-10-CM | POA: Diagnosis present

## 2019-01-28 DIAGNOSIS — M329 Systemic lupus erythematosus, unspecified: Secondary | ICD-10-CM | POA: Diagnosis present

## 2019-01-28 DIAGNOSIS — K219 Gastro-esophageal reflux disease without esophagitis: Secondary | ICD-10-CM | POA: Diagnosis present

## 2019-01-28 DIAGNOSIS — J441 Chronic obstructive pulmonary disease with (acute) exacerbation: Secondary | ICD-10-CM | POA: Diagnosis present

## 2019-01-28 DIAGNOSIS — R0602 Shortness of breath: Secondary | ICD-10-CM | POA: Diagnosis not present

## 2019-01-28 DIAGNOSIS — J440 Chronic obstructive pulmonary disease with acute lower respiratory infection: Secondary | ICD-10-CM | POA: Diagnosis present

## 2019-01-28 DIAGNOSIS — J219 Acute bronchiolitis, unspecified: Secondary | ICD-10-CM | POA: Diagnosis present

## 2019-01-28 DIAGNOSIS — J209 Acute bronchitis, unspecified: Principal | ICD-10-CM | POA: Diagnosis present

## 2019-01-28 DIAGNOSIS — J9601 Acute respiratory failure with hypoxia: Secondary | ICD-10-CM | POA: Diagnosis present

## 2019-01-28 DIAGNOSIS — Z791 Long term (current) use of non-steroidal anti-inflammatories (NSAID): Secondary | ICD-10-CM | POA: Diagnosis not present

## 2019-01-28 DIAGNOSIS — G4733 Obstructive sleep apnea (adult) (pediatric): Secondary | ICD-10-CM | POA: Diagnosis present

## 2019-01-28 DIAGNOSIS — Z79890 Hormone replacement therapy (postmenopausal): Secondary | ICD-10-CM | POA: Diagnosis not present

## 2019-01-28 LAB — CBC WITH DIFFERENTIAL/PLATELET
Abs Immature Granulocytes: 0.05 10*3/uL (ref 0.00–0.07)
Basophils Absolute: 0.1 10*3/uL (ref 0.0–0.1)
Basophils Relative: 1 %
Eosinophils Absolute: 0.4 10*3/uL (ref 0.0–0.5)
Eosinophils Relative: 3 %
HCT: 42.4 % (ref 36.0–46.0)
Hemoglobin: 13.4 g/dL (ref 12.0–15.0)
Immature Granulocytes: 0 %
Lymphocytes Relative: 11 %
Lymphs Abs: 1.3 10*3/uL (ref 0.7–4.0)
MCH: 30.8 pg (ref 26.0–34.0)
MCHC: 31.6 g/dL (ref 30.0–36.0)
MCV: 97.5 fL (ref 80.0–100.0)
Monocytes Absolute: 0.6 10*3/uL (ref 0.1–1.0)
Monocytes Relative: 5 %
Neutro Abs: 10 10*3/uL — ABNORMAL HIGH (ref 1.7–7.7)
Neutrophils Relative %: 80 %
Platelets: 150 10*3/uL (ref 150–400)
RBC: 4.35 MIL/uL (ref 3.87–5.11)
RDW: 14.2 % (ref 11.5–15.5)
WBC: 12.5 10*3/uL — ABNORMAL HIGH (ref 4.0–10.5)
nRBC: 0 % (ref 0.0–0.2)

## 2019-01-28 LAB — BASIC METABOLIC PANEL
Anion gap: 9 (ref 5–15)
BUN: 11 mg/dL (ref 8–23)
CO2: 22 mmol/L (ref 22–32)
Calcium: 8.3 mg/dL — ABNORMAL LOW (ref 8.9–10.3)
Chloride: 111 mmol/L (ref 98–111)
Creatinine, Ser: 0.59 mg/dL (ref 0.44–1.00)
GFR calc Af Amer: >60 mL/min (ref >60)
GFR calc non Af Amer: >60 mL/min (ref >60)
Glucose, Bld: 124 mg/dL — ABNORMAL HIGH (ref 70–99)
Potassium: 4 mmol/L (ref 3.5–5.1)
Sodium: 142 mmol/L (ref 135–145)

## 2019-01-28 LAB — SARS CORONAVIRUS 2 BY RT PCR (HOSPITAL ORDER, PERFORMED IN ~~LOC~~ HOSPITAL LAB): SARS Coronavirus 2: NEGATIVE

## 2019-01-28 LAB — CK: Total CK: 57 U/L (ref 38–234)

## 2019-01-28 MED ORDER — FOLIC ACID 1 MG PO TABS
1.0000 mg | ORAL_TABLET | Freq: Every day | ORAL | Status: DC
  Administered 2019-01-29 – 2019-01-30 (×2): 1 mg via ORAL
  Filled 2019-01-28 (×2): qty 1

## 2019-01-28 MED ORDER — DULOXETINE HCL 30 MG PO CPEP
30.0000 mg | ORAL_CAPSULE | Freq: Every day | ORAL | Status: DC
  Administered 2019-01-29 – 2019-01-30 (×2): 30 mg via ORAL
  Filled 2019-01-28 (×2): qty 1

## 2019-01-28 MED ORDER — ACETAMINOPHEN 325 MG PO TABS
650.0000 mg | ORAL_TABLET | Freq: Four times a day (QID) | ORAL | Status: DC | PRN
  Administered 2019-01-29: 14:00:00 650 mg via ORAL
  Filled 2019-01-28: qty 2

## 2019-01-28 MED ORDER — METHYLPREDNISOLONE SODIUM SUCC 125 MG IJ SOLR
60.0000 mg | INTRAMUSCULAR | Status: DC
  Administered 2019-01-29 – 2019-01-30 (×2): 60 mg via INTRAVENOUS
  Filled 2019-01-28 (×2): qty 2

## 2019-01-28 MED ORDER — IPRATROPIUM-ALBUTEROL 0.5-2.5 (3) MG/3ML IN SOLN
9.0000 mL | Freq: Once | RESPIRATORY_TRACT | Status: AC
  Administered 2019-01-28: 9 mL via RESPIRATORY_TRACT
  Filled 2019-01-28: qty 9

## 2019-01-28 MED ORDER — AZITHROMYCIN 500 MG PO TABS
250.0000 mg | ORAL_TABLET | Freq: Every day | ORAL | Status: DC
  Administered 2019-01-29: 250 mg via ORAL
  Filled 2019-01-28: qty 1

## 2019-01-28 MED ORDER — LORAZEPAM 0.5 MG PO TABS
0.5000 mg | ORAL_TABLET | Freq: Every day | ORAL | Status: DC
  Administered 2019-01-28 – 2019-01-29 (×2): 0.5 mg via ORAL
  Filled 2019-01-28 (×2): qty 1

## 2019-01-28 MED ORDER — LEVOTHYROXINE SODIUM 50 MCG PO TABS
75.0000 ug | ORAL_TABLET | Freq: Every day | ORAL | Status: DC
  Administered 2019-01-29 – 2019-01-30 (×2): 75 ug via ORAL
  Filled 2019-01-28 (×2): qty 1

## 2019-01-28 MED ORDER — IPRATROPIUM-ALBUTEROL 0.5-2.5 (3) MG/3ML IN SOLN
3.0000 mL | RESPIRATORY_TRACT | Status: DC
  Administered 2019-01-29 (×3): 3 mL via RESPIRATORY_TRACT
  Filled 2019-01-28 (×3): qty 3

## 2019-01-28 MED ORDER — ENOXAPARIN SODIUM 40 MG/0.4ML ~~LOC~~ SOLN
40.0000 mg | SUBCUTANEOUS | Status: DC
  Administered 2019-01-28 – 2019-01-29 (×2): 40 mg via SUBCUTANEOUS
  Filled 2019-01-28 (×2): qty 0.4

## 2019-01-28 MED ORDER — DOCUSATE SODIUM 100 MG PO CAPS: 100.0000 mg | ORAL_CAPSULE | Freq: Two times a day (BID) | ORAL | Status: DC | PRN

## 2019-01-28 MED ORDER — ALBUTEROL SULFATE (2.5 MG/3ML) 0.083% IN NEBU: 5.0000 mg | INHALATION_SOLUTION | Freq: Four times a day (QID) | RESPIRATORY_TRACT | Status: DC | PRN

## 2019-01-28 MED ORDER — HYDROXYCHLOROQUINE SULFATE 200 MG PO TABS
200.0000 mg | ORAL_TABLET | Freq: Two times a day (BID) | ORAL | Status: DC
  Administered 2019-01-29 – 2019-01-30 (×4): 200 mg via ORAL
  Filled 2019-01-28 (×5): qty 1

## 2019-01-28 MED ORDER — INFLUENZA VAC A&B SA ADJ QUAD 0.5 ML IM PRSY
0.5000 mL | PREFILLED_SYRINGE | INTRAMUSCULAR | Status: AC
  Administered 2019-01-30: 0.5 mL via INTRAMUSCULAR
  Filled 2019-01-28: qty 0.5

## 2019-01-28 MED ORDER — IBUPROFEN 400 MG PO TABS
800.0000 mg | ORAL_TABLET | Freq: Three times a day (TID) | ORAL | Status: DC | PRN
  Administered 2019-01-28: 800 mg via ORAL
  Filled 2019-01-28 (×2): qty 2

## 2019-01-28 MED ORDER — PANTOPRAZOLE SODIUM 40 MG PO TBEC
40.0000 mg | DELAYED_RELEASE_TABLET | Freq: Every day | ORAL | Status: DC
  Administered 2019-01-29 – 2019-01-30 (×2): 40 mg via ORAL
  Filled 2019-01-28 (×2): qty 1

## 2019-01-28 MED ORDER — OLANZAPINE 10 MG PO TABS
20.0000 mg | ORAL_TABLET | Freq: Every day | ORAL | Status: DC
  Filled 2019-01-28: qty 2

## 2019-01-28 MED ORDER — SODIUM CHLORIDE 0.9 % IV SOLN
500.0000 mg | Freq: Once | INTRAVENOUS | Status: AC
  Administered 2019-01-28: 500 mg via INTRAVENOUS
  Filled 2019-01-28: qty 500

## 2019-01-28 MED ORDER — B COMPLEX-C PO TABS
1.0000 | ORAL_TABLET | Freq: Every day | ORAL | Status: DC
  Administered 2019-01-29 – 2019-01-30 (×2): 1 via ORAL
  Filled 2019-01-28 (×2): qty 1

## 2019-01-28 MED ORDER — DOCUSATE SODIUM 100 MG PO CAPS
100.0000 mg | ORAL_CAPSULE | Freq: Two times a day (BID) | ORAL | Status: DC
  Administered 2019-01-28 – 2019-01-30 (×2): 100 mg via ORAL
  Filled 2019-01-28 (×4): qty 1

## 2019-01-28 MED ORDER — ISOSORBIDE MONONITRATE ER 30 MG PO TB24
30.0000 mg | ORAL_TABLET | Freq: Every day | ORAL | Status: DC
  Administered 2019-01-29 – 2019-01-30 (×2): 30 mg via ORAL
  Filled 2019-01-28 (×2): qty 1

## 2019-01-28 MED ORDER — IPRATROPIUM-ALBUTEROL 0.5-2.5 (3) MG/3ML IN SOLN: 3.0000 mL | Freq: Four times a day (QID) | RESPIRATORY_TRACT | Status: DC | PRN

## 2019-01-28 MED ORDER — SODIUM CHLORIDE 0.9 % IV BOLUS
1000.0000 mL | Freq: Once | INTRAVENOUS | Status: AC
  Administered 2019-01-28: 1000 mL via INTRAVENOUS

## 2019-01-28 MED ORDER — SODIUM CHLORIDE 0.9 % IV SOLN
2.0000 g | Freq: Once | INTRAVENOUS | Status: AC
  Administered 2019-01-28: 2 g via INTRAVENOUS
  Filled 2019-01-28: qty 20

## 2019-01-28 MED ORDER — OLANZAPINE 10 MG PO TABS
20.0000 mg | ORAL_TABLET | Freq: Every day | ORAL | Status: DC
  Administered 2019-01-29 – 2019-01-30 (×2): 20 mg via ORAL
  Filled 2019-01-28 (×2): qty 2

## 2019-01-28 MED ORDER — SUCRALFATE 1 G PO TABS
1.0000 g | ORAL_TABLET | Freq: Three times a day (TID) | ORAL | Status: DC
  Administered 2019-01-29 – 2019-01-30 (×6): 1 g via ORAL
  Filled 2019-01-28 (×6): qty 1

## 2019-01-28 MED ORDER — METHYLPREDNISOLONE SODIUM SUCC 125 MG IJ SOLR
125.0000 mg | Freq: Once | INTRAMUSCULAR | Status: AC
  Administered 2019-01-28: 125 mg via INTRAVENOUS
  Filled 2019-01-28: qty 2

## 2019-01-28 MED ORDER — GABAPENTIN 400 MG PO CAPS
800.0000 mg | ORAL_CAPSULE | Freq: Two times a day (BID) | ORAL | Status: DC
  Administered 2019-01-29 – 2019-01-30 (×3): 800 mg via ORAL
  Filled 2019-01-28: qty 8
  Filled 2019-01-28: qty 2
  Filled 2019-01-28: qty 8
  Filled 2019-01-28 (×3): qty 2

## 2019-01-28 NOTE — Progress Notes (Signed)
Family Meeting Note  Advance Directive:yes  Today a meeting took place with the Patient.  The following clinical team members were present during this meeting:MD  The following were discussed:Patient's diagnosis: Lupus, COPD, bronchitis, hypertension, Patient's progosis: Unable to determine and Goals for treatment: Full Code  Additional follow-up to be provided: Pulmonologist.  Time spent during discussion:20 minutes  Vaughan Basta, MD

## 2019-01-28 NOTE — ED Notes (Signed)
Report given Eyvonne Mechanic RN pt admitted to 1c

## 2019-01-28 NOTE — H&P (Signed)
[facility] NAME: Shelly Freeman    MR#:  [id number]  DATE OF BIRTH:  [birth year]  DATE OF ADMISSION:  01/28/2019  PRIMARY CARE PHYSICIAN: Alene Mires Elyse Jarvis, MD   REQUESTING/REFERRING PHYSICIAN: Joni Fears  CHIEF COMPLAINT:   Chief Complaint  Patient presents with  . Cough    HISTORY OF PRESENT ILLNESS: Shelly Freeman  is a 68 y.o. female with a known history of arthritis, collagen vascular disease, lupus, degenerative joint disease, hypothyroidism-has recurrent bronchitis episodes. Came to emergency room with complaint of fever, cough, chills, shortness of breath for last 3 days.  She denies any sputum production.  She denies any sick contacts but she has a travel history recently. In the ER chest x-ray did not show any acute pneumonia.  She was noted to be negative for COVID-19.  She responded some to bronchodilator nebs but again within an hour or 2 she was feeling significantly short of breath and was hypoxic so ER physician suggested to admit to hospitalist service for further management.  PAST MEDICAL HISTORY:   Past Medical History:  Diagnosis Date  . Arthritis   . Collagen vascular disease ([place])   . DDD (degenerative disc disease), lumbar 05/14/2016  . Depression   . Heart valve problem    per grandaughter need surgery but they told her may not survive;  [name] Hypertension   . Hypothyroidism   . Lupus ([place])   . Neuromuscular disorder ([place])   . Thyroid disease   . Vitamin D deficiency 05/14/2016    PAST SURGICAL HISTORY:  Past Surgical History:  Procedure Laterality Date  . APPENDECTOMY    . BREAST BIOPSY Left    neg  . BREAST LUMPECTOMY Left   . CESAREAN SECTION    . COLONOSCOPY WITH PROPOFOL N/A 07/01/2017   Procedure: COLONOSCOPY WITH PROPOFOL;  Surgeon: Manya Silvas, MD;  Location: [facility] ENDOSCOPY;  Service: Endoscopy;  Laterality: N/A;  . ESOPHAGOGASTRODUODENOSCOPY (EGD) WITH PROPOFOL N/A 07/01/2017    Procedure: ESOPHAGOGASTRODUODENOSCOPY (EGD) WITH PROPOFOL;  Surgeon: Manya Silvas, MD;  Location: [facility] ENDOSCOPY;  Service: Endoscopy;  Laterality: N/A;  . GASTRIC BYPASS    . TUBAL LIGATION      SOCIAL HISTORY:  Social History   Tobacco Use  . Smoking status: Never Smoker  . Smokeless tobacco: Never Used  Substance Use Topics  . Alcohol use: No    FAMILY HISTORY:  Family History  Problem Relation Age of Onset  . Breast cancer Maternal Aunt   . Breast cancer Cousin     DRUG ALLERGIES: No Known Allergies  REVIEW OF SYSTEMS:   CONSTITUTIONAL: No fever, fatigue or weakness.  EYES: No blurred or double vision.  EARS, NOSE, AND THROAT: No tinnitus or ear pain.  RESPIRATORY: Have cough, shortness of breath, wheezing, no hemoptysis.  CARDIOVASCULAR: No chest pain, orthopnea, edema.  GASTROINTESTINAL: No nausea, vomiting, diarrhea or abdominal pain.  GENITOURINARY: No dysuria, hematuria.  ENDOCRINE: No polyuria, nocturia,  HEMATOLOGY: No anemia, easy bruising or bleeding SKIN: No rash or lesion. MUSCULOSKELETAL: No joint pain or arthritis.   NEUROLOGIC: No tingling, numbness, weakness.  PSYCHIATRY: No anxiety or depression.   MEDICATIONS AT HOME:  Prior to Admission medications   Medication Sig Start Date End Date Taking? Authorizing Provider  acetaminophen (TYLENOL) 325 MG tablet Take 650 mg by mouth every 6 (six) hours as needed.   Yes [provider]  albuterol (PROVENTIL HFA;VENTOLIN HFA) 108 (90 Base) MCG/ACT inhaler Inhale  2 puffs into the lungs every 6 (six) hours as needed for wheezing or shortness of breath.   Yes [provider]  alendronate (FOSAMAX) 70 MG tablet Take 70 mg by mouth once a week. Take with a full glass of water on an empty stomach.   Yes [provider]  B Complex Vitamins (B COMPLEX PO) Take 1 tablet by mouth daily.    Yes [provider]  busPIRone (BUSPAR) 10 MG tablet Take 10 mg by mouth daily.   Yes  [provider]  celecoxib (CELEBREX) 200 MG capsule Take 200 mg by mouth daily.    Yes [provider]  doxycycline (VIBRA-TABS) 100 MG tablet Take 100 mg by mouth 2 (two) times daily. 01/27/19 02/01/19 Yes [provider]  DULoxetine (CYMBALTA) 60 MG capsule Take 1 capsule (60 mg total) by mouth daily. Patient taking differently: Take 30 mg by mouth daily.  04/17/16  Yes Pucilowska, Jolanta B, MD  famotidine (PEPCID) 20 MG tablet Take 1 tablet (20 mg total) by mouth 2 (two) times daily. Patient taking differently: Take 20 mg by mouth daily.  04/16/16  Yes Pucilowska, Jolanta B, MD  ferrous sulfate 325 (65 FE) MG tablet Take 325 mg by mouth daily.   Yes [provider]  folic acid (FOLVITE) 1 MG tablet Take 1 mg by mouth daily.   Yes [provider]  gabapentin (NEURONTIN) 800 MG tablet Take 800 mg by mouth 2 (two) times daily.   Yes [provider]  hydroxychloroquine (PLAQUENIL) 200 MG tablet Take 2 tablets (400 mg total) by mouth daily. Patient taking differently: Take 200 mg by mouth 2 (two) times daily.  04/04/16  Yes Theodoro Grist, MD  ibuprofen (ADVIL) 200 MG tablet Take 400-800 mg by mouth every 6 (six) hours as needed for fever, mild pain or moderate pain.   Yes [provider]  isosorbide mononitrate (IMDUR) 30 MG 24 hr tablet Take 30 mg by mouth daily.   Yes [provider]  levothyroxine (SYNTHROID, LEVOTHROID) 75 MCG tablet Take 75 mcg by mouth daily before breakfast.   Yes [provider]  LORazepam (ATIVAN) 0.5 MG tablet Take 0.5 mg by mouth at bedtime.   Yes [provider]  metoprolol succinate (TOPROL-XL) 25 MG 24 hr tablet Take 25 mg by mouth daily.    Yes [provider]  OLANZapine (ZYPREXA) 15 MG tablet Take 1 tablet (15 mg total) by mouth at bedtime. 04/16/16  Yes Pucilowska, Jolanta B, MD  pantoprazole (PROTONIX) 40 MG tablet Take 40 mg by mouth 2 (two) times daily.    Yes  [provider]  sucralfate (CARAFATE) 1 g tablet Take 1 g by mouth 4 (four) times daily -  with meals and at bedtime.    Yes [provider]  traZODone (DESYREL) 100 MG tablet Take 100 mg by mouth at bedtime.   Yes [provider]      PHYSICAL EXAMINATION:   VITAL SIGNS: Blood pressure 133/75, pulse 95, temperature 99.4 F (37.4 C), temperature source Oral, resp. rate (!) 22, height 5\' 1"  (1.549 m), weight 110.2 kg, SpO2 93 %.  GENERAL:  68 y.o.-year-old patient lying in the bed with no acute distress.  EYES: Pupils equal, round, reactive to light and accommodation. No scleral icterus. Extraocular muscles intact.  HEENT: Head atraumatic, normocephalic. Oropharynx and nasopharynx clear.  NECK:  Supple, no jugular venous distention. No thyroid enlargement, no tenderness.  LUNGS: Normal breath sounds bilaterally, bilateral wheezing,  no crepitation. No use of accessory muscles of respiration.  CARDIOVASCULAR: S1, S2 normal. No murmurs, rubs, or gallops.  ABDOMEN: Soft, nontender, nondistended. Bowel sounds present. No organomegaly or mass.  EXTREMITIES: No pedal edema, cyanosis, or clubbing.  NEUROLOGIC: Cranial nerves II through XII are intact. Muscle strength 5/5 in all extremities. Sensation intact. Gait not checked.  PSYCHIATRIC: The patient is alert and oriented x 3.  SKIN: No obvious rash, lesion, or ulcer.   LABORATORY PANEL:   CBC Recent Labs  Lab 01/28/19 1205  WBC 12.5*  HGB 13.4  HCT 42.4  PLT 150  MCV 97.5  MCH 30.8  MCHC 31.6  RDW 14.2  LYMPHSABS 1.3  MONOABS 0.6  EOSABS 0.4  BASOSABS 0.1   ------------------------------------------------------------------------------------------------------------------  Chemistries  Recent Labs  Lab 01/28/19 1303  NA 142  K 4.0  CL 111  CO2 22  GLUCOSE 124*  BUN 11  CREATININE 0.59  CALCIUM 8.3*    ------------------------------------------------------------------------------------------------------------------ estimated creatinine clearance is 79.5 mL/min (by C-G formula based on SCr of 0.59 mg/dL). ------------------------------------------------------------------------------------------------------------------ No results for input(s): TSH, T4TOTAL, T3FREE, THYROIDAB in the last 72 hours.  Invalid input(s): FREET3   Coagulation profile No results for input(s): INR, PROTIME in the last 168 hours. ------------------------------------------------------------------------------------------------------------------- No results for input(s): DDIMER in the last 72 hours. -------------------------------------------------------------------------------------------------------------------  Cardiac Enzymes No results for input(s): CKMB, TROPONINI, MYOGLOBIN in the last 168 hours.  Invalid input(s): CK ------------------------------------------------------------------------------------------------------------------ Invalid input(s): POCBNP  ---------------------------------------------------------------------------------------------------------------  Urinalysis    Component Value Date/Time   COLORURINE YELLOW (A) 04/15/2017 0840   APPEARANCEUR CLEAR (A) 04/15/2017 0840   LABSPEC 1.014 04/15/2017 0840   PHURINE 5.0 04/15/2017 0840   GLUCOSEU NEGATIVE 04/15/2017 0840   HGBUR NEGATIVE 04/15/2017 0840   BILIRUBINUR NEGATIVE 04/15/2017 0840   KETONESUR NEGATIVE 04/15/2017 0840   PROTEINUR NEGATIVE 04/15/2017 0840   NITRITE NEGATIVE 04/15/2017 0840   LEUKOCYTESUR NEGATIVE 04/15/2017 0840     RADIOLOGY: Dg Chest Portable 1 View  Result Date: 01/28/2019 CLINICAL DATA:  Tested for COVID-19 yesterday, results pending; shortness of breath, congestion, cough, history collagen vascular disease, lupus, hypertension EXAM: PORTABLE CHEST 1 VIEW COMPARISON:  Portable exam a 1154 hours  compared to 05/16/2017 FINDINGS: Enlargement of cardiac silhouette with slight pulmonary vascular congestion. Mediastinal contours normal. Lungs clear. No infiltrate, pleural effusion or pneumothorax. Multiple old RIGHT rib fractures. No acute osseous findings. IMPRESSION: Enlargement of cardiac silhouette with slight vascular congestion. No acute abnormalities. Electronically Signed   By: Lavonia Dana M.D.   On: 01/28/2019 12:16    EKG: Orders placed or performed during the hospital encounter of 04/15/17  . EKG 12-Lead  . EKG 12-Lead  . ED EKG 12-Lead  . ED EKG 12-Lead  . EKG    IMPRESSION AND PLAN:  *Acute bronchitis and COPD exacerbation IV steroid and inhaled bronchodilator. Azithromycin for bronchitis. As patient has lupus and she does not follow with any pulmonologist, I would call pulmonary consult.  *Lupus Continue with home meds.  She follows with rheumatologist as outpatient.  *Hypertension I will hold metoprolol for now due to bronchitis and COPD. Continue isosorbide.  *Gastroesophageal reflux disease Continue pantoprazole and sucralfate.  *Anxiety Continue Lorazepam.  *Neuropathy Continue gabapentin.  All the records are reviewed and case discussed with ED provider. Management plans discussed with the patient, family and they are in agreement.  CODE STATUS: Full code Code Status History    Date Active Date Inactive Code Status Order ID Comments User Context   04/15/2017 1104 04/23/2017  2158 Full Code [id number]  Epifanio Lesches, MD ED   04/04/2016 0022 04/16/2016 1639 Full Code [id number]  Gonzella Lex, MD Inpatient   04/04/2016 0022 04/04/2016 0022 Full Code [id number]  Gonzella Lex, MD Inpatient   03/30/2016 1355 04/03/2016 2338 Full Code [id number]  Awilda Bill, NP ED   Advance Care Planning Activity       TOTAL TIME TAKING CARE OF THIS PATIENT: 50 minutes.    Vaughan Basta M.D on 01/28/2019   Between 7am to 6pm - Pager -  [phone]  After 6pm go to [email or web address] - password [email or web address] [facility]  Office  [phone]  CC: Primary care physician; Theotis Burrow, MD   Note: This dictation was prepared with Dragon dictation along with smaller phrase technology. Any transcriptional errors that result from this process are unintentional.

## 2019-01-28 NOTE — ED Notes (Signed)
Sonja RN, aware of bed assigned  

## 2019-01-28 NOTE — ED Notes (Signed)
Patient ambulated with stand by assist. Patient maintained pulse ox at 93-94%, rr24.

## 2019-01-28 NOTE — ED Triage Notes (Signed)
Per EMS report, patient was tested for Covid yesterday and does not have the results. Patient c/o shortness of breath and congested cough. Patient was seen at her PMD and placed on antibiotics and albuterol inhaler.

## 2019-01-28 NOTE — ED Notes (Signed)
Pure wick in place 

## 2019-01-28 NOTE — ED Provider Notes (Signed)
[facility] Emergency Department Provider Note  ____________________________________________  Time seen: Approximately 11:30 AM  I have reviewed the triage vital signs and the nursing notes.   HISTORY  Chief Complaint Cough  Encounter completed with Spanish video interpreter.  HPI Shelly Freeman is a 68 y.o. female with a history of lupus, hypertension, hypothyroidism who comes the ED complaining of shortness of breath and cough, worsening for the past 3 to 4 days.   Symptoms are constant, worse with exertion, associated chills and body aches.  She feels severe generalized weakness.  Decreased appetite, decreased oral intake over the last few days as well.  She saw her primary care doctor yesterday who started her on azithromycin and did a coronavirus test, result is not available yet.  Notably, the patient did fly around trip to [place] within the past month.    Past Medical History:  Diagnosis Date  . Arthritis   . Collagen vascular disease ([place])   . DDD (degenerative disc disease), lumbar 05/14/2016  . Depression   . Heart valve problem    per grandaughter need surgery but they told her may not survive;  [name] Hypertension   . Hypothyroidism   . Lupus ([place])   . Neuromuscular disorder ([place])   . Thyroid disease   . Vitamin D deficiency 05/14/2016     Patient Active Problem List   Diagnosis Date Noted  . Left anterior knee pain 10/24/2016  . Right upper quadrant pain 10/24/2016  . Urination pain 06/26/2016  . DDD (degenerative disc disease), lumbar 05/14/2016  . Lipoma of skin 05/14/2016  . Vitamin D deficiency, unspecified 05/14/2016  . Major neurocognitive disorder, due to vascular disease, with behavioral disturbance, mild ([place]) 04/15/2016  . Severe recurrent major depression with psychotic features ([place]) 04/04/2016  . Lupus ([place]) 04/04/2016  . GERD (gastroesophageal reflux disease) 04/04/2016  . COPD (chronic obstructive pulmonary  disease) ([place]) 04/04/2016  . Acute respiratory failure with hypoxia ([place]) 04/03/2016  . Encephalopathy, metabolic 0000000  . Hypokalemia 04/03/2016  . Pain in limb 04/03/2016  . Hypothyroidism 04/03/2016  . Essential hypertension 04/03/2016  . Intentional opiate overdose ([place]) 03/30/2016  . Age related osteoporosis 03/19/2016  . Chronic midline low back pain without sciatica 03/19/2016  . Encounter for long-term (current) use of high-risk medication 03/19/2016  . Primary osteoarthritis of both knees 03/19/2016     Past Surgical History:  Procedure Laterality Date  . APPENDECTOMY    . BREAST BIOPSY Left    neg  . BREAST LUMPECTOMY Left   . CESAREAN SECTION    . COLONOSCOPY WITH PROPOFOL N/A 07/01/2017   Procedure: COLONOSCOPY WITH PROPOFOL;  Surgeon: Manya Silvas, MD;  Location: [facility] ENDOSCOPY;  Service: Endoscopy;  Laterality: N/A;  . ESOPHAGOGASTRODUODENOSCOPY (EGD) WITH PROPOFOL N/A 07/01/2017   Procedure: ESOPHAGOGASTRODUODENOSCOPY (EGD) WITH PROPOFOL;  Surgeon: Manya Silvas, MD;  Location: [facility] ENDOSCOPY;  Service: Endoscopy;  Laterality: N/A;  . GASTRIC BYPASS    . TUBAL LIGATION       Prior to Admission medications   Medication Sig Start Date End Date Taking? Authorizing Provider  acetaminophen (TYLENOL) 325 MG tablet Take 650 mg by mouth every 6 (six) hours as needed.    [provider]  albuterol (PROVENTIL HFA;VENTOLIN HFA) 108 (90 Base) MCG/ACT inhaler Inhale 2 puffs into the lungs every 6 (six) hours as needed for wheezing or shortness of breath.    [provider]  alendronate (FOSAMAX) 70 MG tablet Take 70 mg by mouth  once a week. Take with a full glass of water on an empty stomach.    [provider]  azithromycin (ZITHROMAX) 250 MG tablet As directed 12/09/17   Allyne Gee, MD  B Complex Vitamins (B COMPLEX PO) Take by mouth.    [provider]  b complex vitamins tablet Take 1 tablet by mouth daily.    [provider]  celecoxib (CELEBREX) 200 MG capsule Take by mouth.    [provider]  diclofenac sodium (VOLTAREN) 1 % GEL Apply topically.    [provider]  docusate sodium (COLACE) 100 MG capsule Take 100 mg by mouth 2 (two) times daily.    [provider]  DULoxetine (CYMBALTA) 60 MG capsule Take 1 capsule (60 mg total) by mouth daily. Patient taking differently: Take 30 mg by mouth daily.  04/17/16   Pucilowska, Herma Ard B, MD  famotidine (PEPCID) 20 MG tablet Take 1 tablet (20 mg total) by mouth 2 (two) times daily. Patient taking differently: Take 20 mg by mouth daily.  04/16/16   Pucilowska, Herma Ard B, MD  folic acid (FOLVITE) 1 MG tablet Take 1 mg by mouth daily.    [provider]  gabapentin (NEURONTIN) 800 MG tablet Take 800 mg by mouth 2 (two) times daily.    [provider]  hydroxychloroquine (PLAQUENIL) 200 MG tablet Take 2 tablets (400 mg total) by mouth daily. Patient taking differently: Take 200 mg by mouth 2 (two) times daily.  04/04/16   Theodoro Grist, MD  ibuprofen (ADVIL,MOTRIN) 600 MG tablet Take 800 mg by mouth every 8 (eight) hours as needed (pain).    [provider]  ipratropium-albuterol (DUONEB) 0.5-2.5 (3) MG/3ML SOLN Take 3 mLs by nebulization every 6 (six) hours as needed (Wheezing). 04/23/17   Hillary Bow, MD  isosorbide mononitrate (IMDUR) 30 MG 24 hr tablet Take 30 mg by mouth daily.    [provider]  levofloxacin (LEVAQUIN) 750 MG tablet Take 1 tablet (750 mg total) by mouth daily. 04/23/17   Hillary Bow, MD  levothyroxine (SYNTHROID, LEVOTHROID) 75 MCG tablet Take 75 mcg by mouth daily before breakfast.    [provider]  LORazepam (ATIVAN) 0.5 MG tablet Take 0.5 mg by mouth at bedtime.    [provider]  metoprolol succinate (TOPROL-XL) 25 MG 24 hr tablet Take 12.5 mg by mouth daily.    [provider]  OLANZapine (ZYPREXA) 15 MG tablet Take 1 tablet (15 mg  total) by mouth at bedtime. Patient taking differently: Take 20 mg by mouth at bedtime.  04/16/16   Pucilowska, Jolanta B, MD  pantoprazole (PROTONIX) 40 MG tablet Take 40 mg by mouth daily.    [provider]  sucralfate (CARAFATE) 1 g tablet Take by mouth. 05/10/17 08/08/17  [provider]  traZODone (DESYREL) 100 MG tablet Take 100 mg by mouth at bedtime.    [provider]     Allergies Patient has no known allergies.   Family History  Problem Relation Age of Onset  . Breast cancer Maternal Aunt   . Breast cancer Cousin     Social History Social History   Tobacco Use  . Smoking status: Never Smoker  . Smokeless tobacco: Never Used  Substance Use Topics  . Alcohol use: No  . Drug use: No    Review of Systems  Constitutional:   No fever positive chills.  ENT:   No sore throat. No rhinorrhea. Cardiovascular:   No chest pain  or syncope. Respiratory:   Positive shortness of breath and nonproductive cough. Gastrointestinal:   Negative for abdominal pain, vomiting and diarrhea.  Musculoskeletal: Positive chest wall pain.  Positive body aches  all other systems reviewed and are negative except as documented above in ROS and HPI.  ____________________________________________   PHYSICAL EXAM:  VITAL SIGNS: ED Triage Vitals  Enc Vitals Group     BP 01/28/19 1117 (!) 105/56     Pulse Rate 01/28/19 1117 88     Resp 01/28/19 1117 18     Temp 01/28/19 1117 99.4 F (37.4 C)     Temp Source 01/28/19 1117 Oral     SpO2 01/28/19 1117 95 %     Weight 01/28/19 1120 243 lb (110.2 kg)     Height 01/28/19 1120 5\' 1"  (1.549 m)     Head Circumference --      Peak Flow --      Pain Score 01/28/19 1118 8     Pain Loc --      Pain Edu? --      Excl. in [place]? --     Vital signs reviewed, nursing assessments reviewed.   Constitutional:   Alert and oriented.  Ill-appearing. Eyes:   Conjunctivae are normal. EOMI. PERRL. ENT      Head:   Normocephalic and  atraumatic.      Nose:   Wearing a mask.      Mouth/Throat:   Wearing a mask.      Neck:   No meningismus. Full ROM. Hematological/Lymphatic/Immunilogical:   No cervical lymphadenopathy. Cardiovascular:   RRR. Symmetric bilateral radial and DP pulses.  No murmurs. Cap refill less than 2 seconds. Respiratory:   Normal respiratory effort without tachypnea/retractions.  Diffuse expiratory wheezing, diminished breath sounds at bilateral bases.. Gastrointestinal:   Soft and nontender. Non distended. There is no CVA tenderness.  No rebound, rigidity, or guarding.  Musculoskeletal:   Normal range of motion in all extremities. No joint effusions.  No lower extremity tenderness.  No edema. Neurologic:   Normal speech and language.  Motor grossly intact. No acute focal neurologic deficits are appreciated.  Skin:    Skin is warm, dry and intact. No rash noted.  No petechiae, purpura, or bullae.  ____________________________________________    LABS (pertinent positives/negatives) (all labs ordered are listed, but only abnormal results are displayed) Labs Reviewed  CBC WITH DIFFERENTIAL/PLATELET - Abnormal; Notable for the following components:      Result Value   WBC 12.5 (*)    Neutro Abs 10.0 (*)    All other components within normal limits  BASIC METABOLIC PANEL - Abnormal; Notable for the following components:   Glucose, Bld 124 (*)    Calcium 8.3 (*)    All other components within normal limits  SARS CORONAVIRUS 2 (HOSPITAL ORDER, [facility])  CK   ____________________________________________   EKG    ____________________________________________    RADIOLOGY  Dg Chest Portable 1 View  Result Date: 01/28/2019 CLINICAL DATA:  Tested for COVID-19 yesterday, results pending; shortness of breath, congestion, cough, history collagen vascular disease, lupus, hypertension EXAM: PORTABLE CHEST 1 VIEW COMPARISON:  Portable exam a 1154 hours compared to  05/16/2017 FINDINGS: Enlargement of cardiac silhouette with slight pulmonary vascular congestion. Mediastinal contours normal. Lungs clear. No infiltrate, pleural effusion or pneumothorax. Multiple old RIGHT rib fractures. No acute osseous findings. IMPRESSION: Enlargement of cardiac silhouette with slight vascular congestion. No acute abnormalities. Electronically Signed   By:  Lavonia Dana M.D.   On: 01/28/2019 12:16    ____________________________________________   PROCEDURES Procedures  ____________________________________________  DIFFERENTIAL DIAGNOSIS   Pneumonia, pleural effusion, COVID-19.   CLINICAL IMPRESSION / ASSESSMENT AND PLAN / ED COURSE  Medications ordered in the ED: Medications  cefTRIAXone (ROCEPHIN) 2 g in sodium chloride 0.9 % 100 mL IVPB (has no administration in time range)  azithromycin (ZITHROMAX) 500 mg in sodium chloride 0.9 % 250 mL IVPB (has no administration in time range)  methylPREDNISolone sodium succinate (SOLU-MEDROL) 125 mg/2 mL injection 125 mg (125 mg Intravenous Given 01/28/19 1210)  ipratropium-albuterol (DUONEB) 0.5-2.5 (3) MG/3ML nebulizer solution 9 mL (9 mLs Nebulization Given 01/28/19 1215)  sodium chloride 0.9 % bolus 1,000 mL (1,000 mLs Intravenous New Bag/Given 01/28/19 1209)    Pertinent labs & imaging results that were available during my care of the patient were reviewed by me and considered in my medical decision making (see chart for details).  Leontyne Boothman was evaluated in Emergency Department on 01/28/2019 for the symptoms described in the history of present illness. She was evaluated in the context of the global COVID-19 pandemic, which necessitated consideration that the patient might be at risk for infection with the SARS-CoV-2 virus that causes COVID-19. Institutional protocols and algorithms that pertain to the evaluation of patients at risk for COVID-19 are in a state of rapid change based on information released by regulatory  bodies including the CDC and federal and state organizations. These policies and algorithms were followed during the patient's care in the ED.   Patient presents with shortness of breath and cough.  Doubt ACS PE dissection AAA pneumothorax.  Not septic.  Check a chest x-ray, COVID test as results from PCP are not available.  Will give Solu-Medrol and duo nebs, IV fluids for hydration.   ----------------------------------------- 3:28 PM on 01/28/2019 -----------------------------------------  Work-up reassuring, COVID negative.  After period of time from the nebs, oxygen saturation dropping back down to 88% on room air.  Patient still feels very symptomatic and weak.  Discussed with hospitalist for further management for COPD exacerbation.     ____________________________________________   FINAL CLINICAL IMPRESSION(S) / ED DIAGNOSES    Final diagnoses:  COPD exacerbation ([place])  Acute respiratory failure with hypoxia [facility])     ED Discharge Orders    None      Portions of this note were generated with dragon dictation software. Dictation errors may occur despite best attempts at proofreading.   Carrie Mew, MD 01/28/19 [phone]

## 2019-01-29 ENCOUNTER — Inpatient Hospital Stay: Payer: Medicare Other

## 2019-01-29 ENCOUNTER — Other Ambulatory Visit: Payer: Self-pay

## 2019-01-29 LAB — BASIC METABOLIC PANEL
Anion gap: 9 (ref 5–15)
BUN: 12 mg/dL (ref 8–23)
CO2: 23 mmol/L (ref 22–32)
Calcium: 8.4 mg/dL — ABNORMAL LOW (ref 8.9–10.3)
Chloride: 110 mmol/L (ref 98–111)
Creatinine, Ser: 0.55 mg/dL (ref 0.44–1.00)
GFR calc Af Amer: >60 mL/min (ref >60)
GFR calc non Af Amer: >60 mL/min (ref >60)
Glucose, Bld: 153 mg/dL — ABNORMAL HIGH (ref 70–99)
Potassium: 3.8 mmol/L (ref 3.5–5.1)
Sodium: 142 mmol/L (ref 135–145)

## 2019-01-29 LAB — C-REACTIVE PROTEIN: CRP: 13.8 mg/dL — ABNORMAL HIGH (ref <1.0)

## 2019-01-29 LAB — CBC
HCT: 40.1 % (ref 36.0–46.0)
Hemoglobin: 12.5 g/dL (ref 12.0–15.0)
MCH: 29.9 pg (ref 26.0–34.0)
MCHC: 31.2 g/dL (ref 30.0–36.0)
MCV: 95.9 fL (ref 80.0–100.0)
Platelets: 172 10*3/uL (ref 150–400)
RBC: 4.18 MIL/uL (ref 3.87–5.11)
RDW: 13.9 % (ref 11.5–15.5)
WBC: 10.7 10*3/uL — ABNORMAL HIGH (ref 4.0–10.5)
nRBC: 0 % (ref 0.0–0.2)

## 2019-01-29 LAB — SEDIMENTATION RATE: Sed Rate: 28 mm/hr (ref 0–30)

## 2019-01-29 MED ORDER — ALBUTEROL SULFATE (2.5 MG/3ML) 0.083% IN NEBU
2.5000 mg | INHALATION_SOLUTION | Freq: Four times a day (QID) | RESPIRATORY_TRACT | Status: DC
  Administered 2019-01-29 – 2019-01-30 (×2): 2.5 mg via RESPIRATORY_TRACT
  Filled 2019-01-29 (×4): qty 3

## 2019-01-29 MED ORDER — TRAZODONE HCL 50 MG PO TABS
100.0000 mg | ORAL_TABLET | Freq: Every day | ORAL | Status: DC
  Administered 2019-01-29: 22:00:00 100 mg via ORAL
  Filled 2019-01-29: qty 2

## 2019-01-29 MED ORDER — GUAIFENESIN-DM 100-10 MG/5ML PO SYRP
5.0000 mL | ORAL_SOLUTION | ORAL | Status: DC | PRN
  Administered 2019-01-29 – 2019-01-30 (×3): 5 mL via ORAL
  Filled 2019-01-29 (×3): qty 5

## 2019-01-29 MED ORDER — BUDESONIDE 0.25 MG/2ML IN SUSP
0.2500 mg | Freq: Two times a day (BID) | RESPIRATORY_TRACT | Status: DC
  Administered 2019-01-29 – 2019-01-30 (×2): 0.25 mg via RESPIRATORY_TRACT
  Filled 2019-01-29 (×2): qty 2

## 2019-01-29 NOTE — Progress Notes (Signed)
[place] [facility] [place] NAME: Shelly Freeman    MR#:  [id number]  DATE OF BIRTH:  [birth year]  SUBJECTIVE:   Patient here due to shortness of breath cough and complaining of generalized body aches more in her lower extremities.  She is not hypoxic she is afebrile.   REVIEW OF SYSTEMS:    Review of Systems  Constitutional: Negative for chills and fever.  HENT: Negative for congestion and tinnitus.   Eyes: Negative for blurred vision and double vision.  Respiratory: Positive for cough and shortness of breath. Negative for wheezing.   Cardiovascular: Negative for chest pain, orthopnea and PND.  Gastrointestinal: Negative for abdominal pain, diarrhea, nausea and vomiting.  Genitourinary: Negative for dysuria and hematuria.  Musculoskeletal: Positive for myalgias.  Neurological: Negative for dizziness, sensory change and focal weakness.  All other systems reviewed and are negative.   Nutrition: Heart Healthy Tolerating Diet: Yes Tolerating PT: Await Eval.    DRUG ALLERGIES:  No Known Allergies  VITALS:  Blood pressure (!) 131/53, pulse 96, temperature 97.6 F (36.4 C), temperature source Oral, resp. rate 16, height 5\' 1"  (1.549 m), weight 105.1 kg, SpO2 94 %.  PHYSICAL EXAMINATION:   Physical Exam  GENERAL:  68 y.o.-year-old patient lying in bed in no acute distress.  EYES: Pupils equal, round, reactive to light and accommodation. No scleral icterus. Extraocular muscles intact.  HEENT: Head atraumatic, normocephalic. Oropharynx and nasopharynx clear.  NECK:  Supple, no jugular venous distention. No thyroid enlargement, no tenderness.  LUNGS: Good a/e b/l, no wheezing, rales, rhonchi. No use of accessory muscles of respiration.  CARDIOVASCULAR: S1, S2 normal. No murmurs, rubs, or gallops.  ABDOMEN: Soft, nontender, nondistended. Bowel sounds present. No organomegaly or mass.  EXTREMITIES: No cyanosis, clubbing or edema b/l.     NEUROLOGIC: Cranial nerves II through XII are intact. No focal Motor or sensory deficits b/l.   PSYCHIATRIC: The patient is alert and oriented x 3.  SKIN: No obvious rash, lesion, or ulcer.    LABORATORY PANEL:   CBC Recent Labs  Lab 01/29/19 0426  WBC 10.7*  HGB 12.5  HCT 40.1  PLT 172   ------------------------------------------------------------------------------------------------------------------  Chemistries  Recent Labs  Lab 01/29/19 0426  NA 142  K 3.8  CL 110  CO2 23  GLUCOSE 153*  BUN 12  CREATININE 0.55  CALCIUM 8.4*   ------------------------------------------------------------------------------------------------------------------  Cardiac Enzymes No results for input(s): TROPONINI in the last 168 hours. ------------------------------------------------------------------------------------------------------------------  RADIOLOGY:  Dg Chest Portable 1 View  Result Date: 01/28/2019 CLINICAL DATA:  Tested for COVID-19 yesterday, results pending; shortness of breath, congestion, cough, history collagen vascular disease, lupus, hypertension EXAM: PORTABLE CHEST 1 VIEW COMPARISON:  Portable exam a 1154 hours compared to 05/16/2017 FINDINGS: Enlargement of cardiac silhouette with slight pulmonary vascular congestion. Mediastinal contours normal. Lungs clear. No infiltrate, pleural effusion or pneumothorax. Multiple old RIGHT rib fractures. No acute osseous findings. IMPRESSION: Enlargement of cardiac silhouette with slight vascular congestion. No acute abnormalities. Electronically Signed   By: Lavonia Dana M.D.   On: 01/28/2019 12:16     ASSESSMENT AND PLAN:   68 year old Hispanic female with past medical history of lupus, pretension, hypothyroidism, depression, osteoarthritis who presented to the hospital due to shortness of breath and cough and generalized myalgias.  1.  Acute respiratory failure with hypoxia- patient presented to the hospital with shortness of  breath cough and myalgias, her COVID-19 test is negative.  Chest x-ray did not  show any evidence of acute process.  Admitted to the hospital for treatment for underlying possible bronchitis. - Continues to have some cough but is not hypoxic presently. - Continue IV steroids, duo nebs as needed.  2.  Acute bronchitis-source of patient's worsening shortness of breath and cough. - Patient does have a previous history of SLE which can be associated with lung disease.  Will get CT chest without contrast to further look at her lungs. -Continue steroids, duo nebs for now.  3.  History of SLE- patient follows with rheumatology as an outpatient. -Her sed rate is presently normal.  Continue Plaquenil.  4.  Neuropathy-continue gabapentin.  5.  Depression-continue Cymbalta.  6.  Hypothyroidism-continue Synthroid.    7.  Essential hypertension-continue Imdur  8. GERD - cont. Protonix.    Await PT eval.    All the records are reviewed and case discussed with Care Management/Social Worker. Management plans discussed with the patient, family and they are in agreement.  CODE STATUS: Full code  DVT Prophylaxis: Lovenox  TOTAL TIME TAKING CARE OF THIS PATIENT: 30 minutes.   POSSIBLE D/C IN 1-2 DAYS, DEPENDING ON CLINICAL CONDITION.   Henreitta Leber M.D on 01/29/2019 at 1:05 PM  Between 7am to 6pm - Pager - [phone]  After 6pm go to [email or web address] - Proofreader  [facility]  Office  [phone]  CC: Primary care physician; Theotis Burrow, MD

## 2019-01-29 NOTE — Consult Note (Signed)
Pulmonary Medicine          Date: 01/29/2019,   MRN# [id number] Shelly Freeman 10-05-1950     AdmissionWeight: 110.2 kg                 CurrentWeight: 105.1 kg      CHIEF COMPLAINT:   Shortness of breath and cough x 5 days   HISTORY OF PRESENT ILLNESS   This is a pleasant 68 yo female hx of SLE on plaquenil she has Asthma and uses Advair bid , she has OSA and uses CPAP at home, additional comorbid conditions as below.  She states for past 5 days she felt worsening SOB, cough and LRTI symptoms, she measured her temp multiple times and states some were 100.2.  She denies chest pain.    Patient is a never smoker.  She did come in mildy hypoxemic but that resolved overngiht.  She still has cough and sob.  There is mild leukocytosis.  Patient admits to chocking on food when she eats.  ESR and CRP was normal.  I reviewed her CT chest and it has mild ground glass infiltarte worse at lingular segments and thickening of airways, this may be due to asthma exacerbation vs acute bronchitis. There is no mass, effusion, consolidation.   She admits to joint pains and sometimes patiens with SLE have lupus pmeunonitis but inflamamtory biomarkers are normal so I doubt this is related to underlying AI disease.   PAST MEDICAL HISTORY   Past Medical History:  Diagnosis Date  . Arthritis   . Collagen vascular disease ([place])   . DDD (degenerative disc disease), lumbar 05/14/2016  . Depression   . Heart valve problem    per grandaughter need surgery but they told her may not survive;  [name] Hypertension   . Hypothyroidism   . Lupus ([place])   . Neuromuscular disorder ([place])   . Thyroid disease   . Vitamin D deficiency 05/14/2016     SURGICAL HISTORY   Past Surgical History:  Procedure Laterality Date  . APPENDECTOMY    . BREAST BIOPSY Left    neg  . BREAST LUMPECTOMY Left   . CESAREAN SECTION    . COLONOSCOPY WITH PROPOFOL N/A 07/01/2017   Procedure: COLONOSCOPY WITH PROPOFOL;   Surgeon: Manya Silvas, MD;  Location: [facility] ENDOSCOPY;  Service: Endoscopy;  Laterality: N/A;  . ESOPHAGOGASTRODUODENOSCOPY (EGD) WITH PROPOFOL N/A 07/01/2017   Procedure: ESOPHAGOGASTRODUODENOSCOPY (EGD) WITH PROPOFOL;  Surgeon: Manya Silvas, MD;  Location: [facility] ENDOSCOPY;  Service: Endoscopy;  Laterality: N/A;  . GASTRIC BYPASS    . TUBAL LIGATION       FAMILY HISTORY   Family History  Problem Relation Age of Onset  . Breast cancer Maternal Aunt   . Breast cancer Cousin      SOCIAL HISTORY   Social History   Tobacco Use  . Smoking status: Never Smoker  . Smokeless tobacco: Never Used  Substance Use Topics  . Alcohol use: No  . Drug use: No     MEDICATIONS    Home Medication:    Current Medication:  Current Facility-Administered Medications:  .  acetaminophen (TYLENOL) tablet 650 mg, 650 mg, Oral, Q6H PRN, Vaughan Basta, MD .  albuterol (PROVENTIL) (2.5 MG/3ML) 0.083% nebulizer solution 5 mg, 5 mg, Inhalation, Q6H PRN, Vaughan Basta, MD .  azithromycin (ZITHROMAX) tablet 250 mg, 250 mg, Oral, Daily, Vaughan Basta, MD .  B-complex with vitamin C tablet 1 tablet, 1 tablet, Oral,  Daily, Vaughan Basta, MD, 1 tablet at 01/29/19 [phone] .  docusate sodium (COLACE) capsule 100 mg, 100 mg, Oral, BID, Vaughan Basta, MD, 100 mg at 01/28/19 2339 .  docusate sodium (COLACE) capsule 100 mg, 100 mg, Oral, BID PRN, Vaughan Basta, MD .  DULoxetine (CYMBALTA) DR capsule 30 mg, 30 mg, Oral, Daily, Vaughan Basta, MD, 30 mg at 01/29/19 0948 .  enoxaparin (LOVENOX) injection 40 mg, 40 mg, Subcutaneous, Q24H, Vaughan Basta, MD, 40 mg at 01/28/19 2341 .  folic acid (FOLVITE) tablet 1 mg, 1 mg, Oral, Daily, Vaughan Basta, MD, 1 mg at 01/29/19 0948 .  gabapentin (NEURONTIN) capsule 800 mg, 800 mg, Oral, BID, Vaughan Basta, MD, 800 mg at 01/29/19 0949 .  guaiFENesin-dextromethorphan (ROBITUSSIN DM)  100-10 MG/5ML syrup 5 mL, 5 mL, Oral, Q4H PRN, Sainani, Vivek J, MD .  hydroxychloroquine (PLAQUENIL) tablet 200 mg, 200 mg, Oral, BID, Vaughan Basta, MD, 200 mg at 01/29/19 0948 .  ibuprofen (ADVIL) tablet 800 mg, 800 mg, Oral, Q8H PRN, Vaughan Basta, MD, 800 mg at 01/28/19 2339 .  influenza vaccine adjuvanted (FLUAD) injection 0.5 mL, 0.5 mL, Intramuscular, Tomorrow-1000, Sainani, Vivek J, MD .  ipratropium-albuterol (DUONEB) 0.5-2.5 (3) MG/3ML nebulizer solution 3 mL, 3 mL, Nebulization, Q6H PRN, Vaughan Basta, MD .  ipratropium-albuterol (DUONEB) 0.5-2.5 (3) MG/3ML nebulizer solution 3 mL, 3 mL, Nebulization, Q4H, Vaughan Basta, MD, 3 mL at 01/29/19 1147 .  isosorbide mononitrate (IMDUR) 24 hr tablet 30 mg, 30 mg, Oral, Daily, Vaughan Basta, MD, 30 mg at 01/29/19 0948 .  levothyroxine (SYNTHROID) tablet 75 mcg, 75 mcg, Oral, QAC breakfast, Vaughan Basta, MD, 75 mcg at 01/29/19 0947 .  LORazepam (ATIVAN) tablet 0.5 mg, 0.5 mg, Oral, QHS, Vaughan Basta, MD, 0.5 mg at 01/28/19 2339 .  methylPREDNISolone sodium succinate (SOLU-MEDROL) 125 mg/2 mL injection 60 mg, 60 mg, Intravenous, Q24H, Vaughan Basta, MD, 60 mg at 01/29/19 1216 .  OLANZapine (ZYPREXA) tablet 20 mg, 20 mg, Oral, Daily, Henreitta Leber, MD, 20 mg at 01/29/19 0949 .  pantoprazole (PROTONIX) EC tablet 40 mg, 40 mg, Oral, Daily, Vaughan Basta, MD, 40 mg at 01/29/19 0948 .  sucralfate (CARAFATE) tablet 1 g, 1 g, Oral, TID WC & HS, Vaughan Basta, MD, 1 g at 01/29/19 1216    ALLERGIES   Patient has no known allergies.     REVIEW OF SYSTEMS    Review of Systems:  Gen:  Denies  fever, sweats, chills weigh loss  HEENT: Denies blurred vision, double vision, ear pain, eye pain, hearing loss, nose bleeds, sore throat Cardiac:  No dizziness, chest pain or heaviness, chest tightness,edema Resp:   Denies cough or sputum porduction, shortness of  breath,wheezing, hemoptysis,  Gi: Denies swallowing difficulty, stomach pain, nausea or vomiting, diarrhea, constipation, bowel incontinence Gu:  Denies bladder incontinence, burning urine Ext:   Denies Joint pain, stiffness or swelling Skin: Denies  skin rash, easy bruising or bleeding or hives Endoc:  Denies polyuria, polydipsia , polyphagia or weight change Psych:   Denies depression, insomnia or hallucinations   Other:  All other systems negative   VS: BP (!) 131/53   Pulse 96   Temp 97.6 F (36.4 C) (Oral)   Resp 16   Ht 5' 1"  (1.549 m)   Wt 105.1 kg   SpO2 94%   BMI 43.78 kg/m      PHYSICAL EXAM    GENERAL:NAD, no fevers, chills, no weakness no fatigue HEAD: Normocephalic, atraumatic.  EYES: Pupils equal, round, reactive to  light. Extraocular muscles intact. No scleral icterus.  MOUTH: Moist mucosal membrane. Dentition intact. No abscess noted.  EAR, NOSE, THROAT: Clear without exudates. No external lesions.  NECK: Supple. No thyromegaly. No nodules. No JVD.  PULMONARY: Diffuse coarse rhonchi right sided +wheezes CARDIOVASCULAR: S1 and S2. Regular rate and rhythm. No murmurs, rubs, or gallops. No edema. Pedal pulses 2+ bilaterally.  GASTROINTESTINAL: Soft, nontender, nondistended. No masses. Positive bowel sounds. No hepatosplenomegaly.  MUSCULOSKELETAL: No swelling, clubbing, or edema. Range of motion full in all extremities.  NEUROLOGIC: Cranial nerves II through XII are intact. No gross focal neurological deficits. Sensation intact. Reflexes intact.  SKIN: No ulceration, lesions, rashes, or cyanosis. Skin warm and dry. Turgor intact.  PSYCHIATRIC: Mood, affect within normal limits. The patient is awake, alert and oriented x 3. Insight, judgment intact.       IMAGING    Dg Chest Portable 1 View  Result Date: 01/28/2019 CLINICAL DATA:  Tested for COVID-19 yesterday, results pending; shortness of breath, congestion, cough, history collagen vascular disease,  lupus, hypertension EXAM: PORTABLE CHEST 1 VIEW COMPARISON:  Portable exam a 1154 hours compared to 05/16/2017 FINDINGS: Enlargement of cardiac silhouette with slight pulmonary vascular congestion. Mediastinal contours normal. Lungs clear. No infiltrate, pleural effusion or pneumothorax. Multiple old RIGHT rib fractures. No acute osseous findings. IMPRESSION: Enlargement of cardiac silhouette with slight vascular congestion. No acute abnormalities. Electronically Signed   By: Lavonia Dana M.D.   On: 01/28/2019 12:16      ASSESSMENT/PLAN    Moderate asthma exacerbation   - possible due to viral etiology - COVID negative , will order RVP   - agree with empiric Zithromax/Rocephin  - patient is already improving   - agree with solumedrol - currently 60 Iv daily   - will add pulmicort neb  - switching DuoNeb to albuterol only  - ESR normal   - incentive spirometry at bedside - encourage to use 10/hr  - PT/OT    OSA     - please continue with QHS CPAP - RT to place settings     Thank you for allowing me to participate in the care of this patient.    Patient/Family are satisfied with care plan and all questions have been answered.  This document was prepared using Dragon voice recognition software and may include unintentional dictation errors.     Ottie Glazier, M.D.  Division of [place]

## 2019-01-29 NOTE — Care Management Important Message (Signed)
Important Message  Patient Details  Name: Shelly Freeman MRN: [id number] Date of Birth: 22-Jul-1950   Medicare Important Message Given:  Yes  Initial Medicare IM given by Patient Access Associate on 01/29/2019 at 11:18am.    Dannette Barbara 01/29/2019, 12:56 PM

## 2019-01-29 NOTE — Plan of Care (Signed)
  Problem: Health Behavior/Discharge Planning: Goal: Ability to manage health-related needs will improve Outcome: Progressing   Problem: Clinical Measurements: Goal: Ability to maintain clinical measurements within normal limits will improve Outcome: Progressing Goal: Will remain free from infection Outcome: Progressing Goal: Diagnostic test results will improve Outcome: Progressing Goal: Respiratory complications will improve Outcome: Progressing Goal: Cardiovascular complication will be avoided Outcome: Progressing   Problem: Activity: Goal: Risk for activity intolerance will decrease Outcome: Progressing   Problem: Nutrition: Goal: Adequate nutrition will be maintained Outcome: Progressing   Problem: Coping: Goal: Level of anxiety will decrease Outcome: Progressing   Problem: Elimination: Goal: Will not experience complications related to bowel motility Outcome: Progressing   Problem: Pain Managment: Goal: General experience of comfort will improve Outcome: Progressing   Problem: Safety: Goal: Ability to remain free from injury will improve Outcome: Progressing   Problem: Skin Integrity: Goal: Risk for impaired skin integrity will decrease Outcome: Progressing   Problem: Education: Goal: Knowledge of disease or condition will improve Outcome: Progressing   Problem: Activity: Goal: Ability to tolerate increased activity will improve Outcome: Progressing   Problem: Respiratory: Goal: Ability to maintain a clear airway will improve Outcome: Progressing Goal: Levels of oxygenation will improve Outcome: Progressing

## 2019-01-29 NOTE — Clinical Social Work Note (Signed)
CSW acknowledges home health consult. Please enter PT/OT orders to determine needs.  Dayton Scrape, [place]

## 2019-01-30 LAB — HIV ANTIBODY (ROUTINE TESTING W REFLEX): HIV Screen 4th Generation wRfx: NONREACTIVE

## 2019-01-30 MED ORDER — IPRATROPIUM-ALBUTEROL 0.5-2.5 (3) MG/3ML IN SOLN: 3.0000 mL | Freq: Four times a day (QID) | RESPIRATORY_TRACT | 0 refills | Status: DC | PRN

## 2019-01-30 MED ORDER — DOXYCYCLINE HYCLATE 100 MG PO TABS: 100.0000 mg | ORAL_TABLET | Freq: Two times a day (BID) | ORAL | 0 refills | Status: AC

## 2019-01-30 MED ORDER — DOCUSATE SODIUM 100 MG PO CAPS: 100.0000 mg | ORAL_CAPSULE | Freq: Two times a day (BID) | ORAL | Status: DC

## 2019-01-30 MED ORDER — ALBUTEROL SULFATE (2.5 MG/3ML) 0.083% IN NEBU
2.5000 mg | INHALATION_SOLUTION | Freq: Three times a day (TID) | RESPIRATORY_TRACT | Status: DC
  Administered 2019-01-30: 15:00:00 2.5 mg via RESPIRATORY_TRACT
  Filled 2019-01-30: qty 3

## 2019-01-30 MED ORDER — PREDNISONE 10 MG PO TABS: ORAL_TABLET | ORAL | 0 refills | Status: DC

## 2019-01-30 NOTE — Progress Notes (Signed)
Pulmonary Medicine          Date: 01/30/2019,   MRN# [id number] Shelly Freeman July 07, 1950     AdmissionWeight: 110.2 kg                 CurrentWeight: 105.1 kg      CHIEF COMPLAINT:   Shortness of breath and cough x 5 days   SUBJECTIVE   Patient with improved breathing this am.   She got CPAP last night slept better.   PAST MEDICAL HISTORY   Past Medical History:  Diagnosis Date  . Arthritis   . Collagen vascular disease ([place])   . DDD (degenerative disc disease), lumbar 05/14/2016  . Depression   . Heart valve problem    per grandaughter need surgery but they told her may not survive;  [name] Hypertension   . Hypothyroidism   . Lupus ([place])   . Neuromuscular disorder ([place])   . Thyroid disease   . Vitamin D deficiency 05/14/2016     SURGICAL HISTORY   Past Surgical History:  Procedure Laterality Date  . APPENDECTOMY    . BREAST BIOPSY Left    neg  . BREAST LUMPECTOMY Left   . CESAREAN SECTION    . COLONOSCOPY WITH PROPOFOL N/A 07/01/2017   Procedure: COLONOSCOPY WITH PROPOFOL;  Surgeon: Manya Silvas, MD;  Location: [facility] ENDOSCOPY;  Service: Endoscopy;  Laterality: N/A;  . ESOPHAGOGASTRODUODENOSCOPY (EGD) WITH PROPOFOL N/A 07/01/2017   Procedure: ESOPHAGOGASTRODUODENOSCOPY (EGD) WITH PROPOFOL;  Surgeon: Manya Silvas, MD;  Location: [facility] ENDOSCOPY;  Service: Endoscopy;  Laterality: N/A;  . GASTRIC BYPASS    . TUBAL LIGATION       FAMILY HISTORY   Family History  Problem Relation Age of Onset  . Breast cancer Maternal Aunt   . Breast cancer Cousin      SOCIAL HISTORY   Social History   Tobacco Use  . Smoking status: Never Smoker  . Smokeless tobacco: Never Used  Substance Use Topics  . Alcohol use: No  . Drug use: No     MEDICATIONS    Home Medication:    Current Medication:  Current Facility-Administered Medications:  .  acetaminophen (TYLENOL) tablet 650 mg, 650 mg, Oral, Q6H PRN, Vaughan Basta, MD, 650  mg at 01/29/19 1422 .  albuterol (PROVENTIL) (2.5 MG/3ML) 0.083% nebulizer solution 2.5 mg, 2.5 mg, Nebulization, TID, Sainani, Vivek J, MD .  albuterol (PROVENTIL) (2.5 MG/3ML) 0.083% nebulizer solution 5 mg, 5 mg, Inhalation, Q6H PRN, Vaughan Basta, MD .  azithromycin (ZITHROMAX) tablet 250 mg, 250 mg, Oral, Daily, Vaughan Basta, MD, 250 mg at 01/29/19 1703 .  B-complex with vitamin C tablet 1 tablet, 1 tablet, Oral, Daily, Vaughan Basta, MD, 1 tablet at 01/30/19 0941 .  budesonide (PULMICORT) nebulizer solution 0.25 mg, 0.25 mg, Nebulization, BID, Lanney Gins, Zyrell Carmean, MD, 0.25 mg at 01/30/19 0750 .  docusate sodium (COLACE) capsule 100 mg, 100 mg, Oral, BID, Vaughan Basta, MD, 100 mg at 01/30/19 0941 .  docusate sodium (COLACE) capsule 100 mg, 100 mg, Oral, BID PRN, Vaughan Basta, MD .  DULoxetine (CYMBALTA) DR capsule 30 mg, 30 mg, Oral, Daily, Vaughan Basta, MD, 30 mg at 01/30/19 0941 .  enoxaparin (LOVENOX) injection 40 mg, 40 mg, Subcutaneous, Q24H, Vaughan Basta, MD, 40 mg at 01/29/19 2227 .  folic acid (FOLVITE) tablet 1 mg, 1 mg, Oral, Daily, Vaughan Basta, MD, 1 mg at 01/30/19 0942 .  gabapentin (NEURONTIN) capsule 800 mg, 800 mg, Oral, BID, Vaughan Basta,  MD, 800 mg at 01/30/19 0942 .  guaiFENesin-dextromethorphan (ROBITUSSIN DM) 100-10 MG/5ML syrup 5 mL, 5 mL, Oral, Q4H PRN, Henreitta Leber, MD, 5 mL at 01/30/19 0937 .  hydroxychloroquine (PLAQUENIL) tablet 200 mg, 200 mg, Oral, BID, Vaughan Basta, MD, 200 mg at 01/30/19 0942 .  ibuprofen (ADVIL) tablet 800 mg, 800 mg, Oral, Q8H PRN, Vaughan Basta, MD, 800 mg at 01/28/19 2339 .  influenza vaccine adjuvanted (FLUAD) injection 0.5 mL, 0.5 mL, Intramuscular, Tomorrow-1000, Sainani, Vivek J, MD .  ipratropium-albuterol (DUONEB) 0.5-2.5 (3) MG/3ML nebulizer solution 3 mL, 3 mL, Nebulization, Q6H PRN, Vaughan Basta, MD .  isosorbide  mononitrate (IMDUR) 24 hr tablet 30 mg, 30 mg, Oral, Daily, Vaughan Basta, MD, 30 mg at 01/30/19 0941 .  levothyroxine (SYNTHROID) tablet 75 mcg, 75 mcg, Oral, QAC breakfast, Vaughan Basta, MD, 75 mcg at 01/30/19 0927 .  LORazepam (ATIVAN) tablet 0.5 mg, 0.5 mg, Oral, QHS, Vaughan Basta, MD, 0.5 mg at 01/29/19 2225 .  methylPREDNISolone sodium succinate (SOLU-MEDROL) 125 mg/2 mL injection 60 mg, 60 mg, Intravenous, Q24H, Vaughan Basta, MD, 60 mg at 01/29/19 1216 .  OLANZapine (ZYPREXA) tablet 20 mg, 20 mg, Oral, Daily, Henreitta Leber, MD, 20 mg at 01/30/19 0941 .  pantoprazole (PROTONIX) EC tablet 40 mg, 40 mg, Oral, Daily, Vaughan Basta, MD, 40 mg at 01/30/19 0942 .  sucralfate (CARAFATE) tablet 1 g, 1 g, Oral, TID WC & HS, Vaughan Basta, MD, 1 g at 01/30/19 0927 .  traZODone (DESYREL) tablet 100 mg, 100 mg, Oral, QHS, Sainani, Belia Heman, MD, 100 mg at 01/29/19 2221    ALLERGIES   Patient has no known allergies.     REVIEW OF SYSTEMS    Review of Systems:  Gen:  Denies  fever, sweats, chills weigh loss  HEENT: Denies blurred vision, double vision, ear pain, eye pain, hearing loss, nose bleeds, sore throat Cardiac:  No dizziness, chest pain or heaviness, chest tightness,edema Resp:   Denies cough or sputum porduction, shortness of breath,wheezing, hemoptysis,  Gi: Denies swallowing difficulty, stomach pain, nausea or vomiting, diarrhea, constipation, bowel incontinence Gu:  Denies bladder incontinence, burning urine Ext:   Denies Joint pain, stiffness or swelling Skin: Denies  skin rash, easy bruising or bleeding or hives Endoc:  Denies polyuria, polydipsia , polyphagia or weight change Psych:   Denies depression, insomnia or hallucinations   Other:  All other systems negative   VS: BP 136/66 (BP Location: Left Arm)   Pulse 90   Temp 97.8 F (36.6 C) (Oral)   Resp 17   Ht '5\' 1"'$  (1.549 m)   Wt 105.1 kg   SpO2 96%   BMI  43.78 kg/m      PHYSICAL EXAM    GENERAL:NAD, no fevers, chills, no weakness no fatigue HEAD: Normocephalic, atraumatic.  EYES: Pupils equal, round, reactive to light. Extraocular muscles intact. No scleral icterus.  MOUTH: Moist mucosal membrane. Dentition intact. No abscess noted.  EAR, NOSE, THROAT: Clear without exudates. No external lesions.  NECK: Supple. No thyromegaly. No nodules. No JVD.  PULMONARY: decreased bs b/l CARDIOVASCULAR: S1 and S2. Regular rate and rhythm. No murmurs, rubs, or gallops. No edema. Pedal pulses 2+ bilaterally.  GASTROINTESTINAL: Soft, nontender, nondistended. No masses. Positive bowel sounds. No hepatosplenomegaly.  MUSCULOSKELETAL: No swelling, clubbing, or edema. Range of motion full in all extremities.  NEUROLOGIC: Cranial nerves II through XII are intact. No gross focal neurological deficits. Sensation intact. Reflexes intact.  SKIN: No ulceration, lesions, rashes, or cyanosis.  Skin warm and dry. Turgor intact.  PSYCHIATRIC: Mood, affect within normal limits. The patient is awake, alert and oriented x 3. Insight, judgment intact.       IMAGING    Ct Chest Wo Contrast  Result Date: 01/29/2019 CLINICAL DATA:  68 year old female with history of chest pain, shortness of breath and cough. History of lupus. EXAM: CT CHEST WITHOUT CONTRAST TECHNIQUE: Multidetector CT imaging of the chest was performed following the standard protocol without IV contrast. COMPARISON:  Chest CT 04/18/2017. FINDINGS: Cardiovascular: Heart size is normal. There is no significant pericardial fluid, thickening or pericardial calcification. There is aortic atherosclerosis, as well as atherosclerosis of the great vessels of the mediastinum and the coronary arteries, including calcified atherosclerotic plaque in the left anterior descending, left circumflex and right coronary arteries. Dilatation of the pulmonic trunk (3.5 cm in diameter). Mediastinum/Nodes: No pathologically  enlarged mediastinal or hilar lymph nodes. Please note that accurate exclusion of hilar adenopathy is limited on noncontrast CT scans. Small hiatal hernia. No axillary lymphadenopathy. Lungs/Pleura: Small calcified granuloma in the left upper lobe. No other suspicious appearing pulmonary nodules or masses are noted. No acute consolidative airspace disease. No pleural effusions. Upper Abdomen: Postoperative changes of Roux-en-Y gastric bypass. Aortic atherosclerosis. Musculoskeletal: There are no aggressive appearing lytic or blastic lesions noted in the visualized portions of the skeleton. IMPRESSION: 1. No acute findings are noted in the thorax to account for the patient's symptoms. 2. Dilatation of the pulmonic trunk, concerning for pulmonary arterial hypertension. 3. Aortic atherosclerosis, in addition to three-vessel coronary artery disease. Please note that although the presence of coronary artery calcium documents the presence of coronary artery disease, the severity of this disease and any potential stenosis cannot be assessed on this non-gated CT examination. Assessment for potential risk factor modification, dietary therapy or pharmacologic therapy may be warranted, if clinically indicated. 4. Additional incidental findings, as above. Aortic Atherosclerosis (ICD10-I70.0). Electronically Signed   By: Vinnie Langton M.D.   On: 01/29/2019 13:32   Dg Chest Portable 1 View  Result Date: 01/28/2019 CLINICAL DATA:  Tested for COVID-19 yesterday, results pending; shortness of breath, congestion, cough, history collagen vascular disease, lupus, hypertension EXAM: PORTABLE CHEST 1 VIEW COMPARISON:  Portable exam a 1154 hours compared to 05/16/2017 FINDINGS: Enlargement of cardiac silhouette with slight pulmonary vascular congestion. Mediastinal contours normal. Lungs clear. No infiltrate, pleural effusion or pneumothorax. Multiple old RIGHT rib fractures. No acute osseous findings. IMPRESSION: Enlargement of  cardiac silhouette with slight vascular congestion. No acute abnormalities. Electronically Signed   By: Lavonia Dana M.D.   On: 01/28/2019 12:16      ASSESSMENT/PLAN    Moderate asthma exacerbation   - possible due to viral etiology - COVID negative , will order RVP   - agree with empiric Zithromax/Rocephin  - patient is already improving   - agree with solumedrol - currently 60 Iv daily   - will add pulmicort neb  - switching DuoNeb to albuterol only  - ESR normal   - incentive spirometry at bedside - encourage to use 10/hr  - PT/OT    OSA     - please continue with QHS CPAP - RT to place settings     Thank you for allowing me to participate in the care of this patient.    Patient/Family are satisfied with care plan and all questions have been answered.  This document was prepared using Dragon voice recognition software and may include unintentional dictation errors.  Ottie Glazier, M.D.  Division of [place]

## 2019-01-30 NOTE — TOC Transition Note (Signed)
Transition of Care [facility]) - CM/SW Discharge Note   Patient Details  Name: Shelly Freeman MRN: [id number] Date of Birth: [birth year]  [facility] CM/SW Contact:  Latanya Maudlin, RN Phone Number: 01/30/2019, 3:01 PM   Clinical Narrative: Patient to be discharged per MD order. Orders in place for home health services. PT recommending home health. CMS Medicare.gov Compare Post Acute Care list reviewed with patient and they have no preference of agency. Referral placed with [facility]. No DME needs. Family to transport.     Final next level of care: Home w Home Health Services Barriers to Discharge: No Barriers Identified   Patient Goals and CMS Choice   CMS Medicare.gov Compare Post Acute Care list provided to:: Patient Choice offered to / list presented to : Patient  Discharge Placement                       Discharge Plan and Services     Post Acute Care Choice: Home Health                    HH Arranged: PT [place]: [facility] Date [place]: 01/30/19 Time [place]: [id number] Representative spoke with at [place]: [place]  Social Determinants of Health ([place]) Interventions     Readmission Risk Interventions Readmission Risk Prevention Plan 01/30/2019  Post Dischage Appt Complete  Medication Screening Complete  Transportation Screening Complete  Some recent data might be hidden

## 2019-01-30 NOTE — Evaluation (Signed)
Physical Therapy Evaluation Patient Details Name: Shelly Freeman MRN: [id number] DOB: Feb 19, 1951 Today's Date: 01/30/2019   History of Present Illness  68 year old [place] speaking female with past medical history of lupus, pretension, hypothyroidism, depression, osteoarthritis who presented to the hospital due to shortness of breath and cough and generalized myalgias. Chest xray negative and covid test negative, SOB may be secondary to acute bronchitis.    Clinical Impression  Patient woken at start of session, remained tired/sleepy throughout session. Language line utilized for spanish interpreter through whole session. Pt reported she lives in a first floor apartment alone, but has a grand daugther available whenever she needs her. Denies any falls in the last 6 months, uses SPC for ambulation at baseline.  Patient demonstrated bed mobility with mod I, and transfers with supervision/mod I with RW. The patient ambulated ~73ft with RW and CGA/supervision. The pt did not want to ambulate further, complained of dizziness, when instructed in standing rest break and PLB, pt reported dizziness improved. Very decreased gait velocity, no overt LOB noted, tended to keep RW outside base of support. Pt educated about importance of energy conservation and use of RW at home until she gets stronger.  Overall the patient demonstrated deficits (see "PT Problem List") that impede the patient's functional abilities, safety, and mobility and would benefit from skilled PT intervention. Recommendation is HHPT with supervision for mobility/OOB for pt safety.     Follow Up Recommendations Home health PT;Supervision for mobility/OOB    Equipment Recommendations  Rolling walker with 5" wheels;Other (comment)(Pt has RW at home)    Recommendations for Other Services       Precautions / Restrictions Precautions Precautions: Fall Restrictions Weight Bearing Restrictions: No      Mobility  Bed Mobility Overal  bed mobility: Modified Independent                Transfers Overall transfer level: Modified independent Equipment used: Rolling walker (2 wheeled)                Ambulation/Gait Ambulation/Gait assistance: Supervision;Min guard Gait Distance (Feet): 50 Feet Assistive device: Rolling walker (2 wheeled)       General Gait Details: Pt did not want to ambulate further, complained of dizziness, when instructed in standing rest break and PLB, pt reported dizziness improved. Very decreased gait velocity, no overt LOB noted, tended to keep RW outside base of support.  Stairs            Wheelchair Mobility    Modified Rankin (Stroke Patients Only)       Balance Overall balance assessment: Needs assistance Sitting-balance support: Feet supported Sitting balance-Leahy Scale: Fair       Standing balance-Leahy Scale: Fair Standing balance comment: prefers unilateral UE support at a minimum                             Pertinent Vitals/Pain Pain Assessment: No/denies pain    Home Living Family/patient expects to be discharged to:: Private residence Living Arrangements: Alone Available Help at Discharge: Family(granddaughter, pt reported she can help whenever she needs it, granddaughter does not work.) Type of Home: Apartment Home Access: Level entry     Home Layout: One level Home Equipment: Environmental consultant - 2 wheels;Cane - single point      Prior Function Level of Independence: Independent with assistive device(s);Needs assistance   Gait / Transfers Assistance Needed: ambulates with SPC  ADL's / Homemaking Assistance Needed: independent in  dressing/bathing/cooking  Comments: denies any falls in the last 6 months     Hand Dominance        Extremity/Trunk Assessment   Upper Extremity Assessment Upper Extremity Assessment: Generalized weakness    Lower Extremity Assessment Lower Extremity Assessment: Generalized weakness    Cervical /  Trunk Assessment Cervical / Trunk Assessment: Normal  Communication   Communication: Interpreter utilized  Cognition Arousal/Alertness: (Pt fatigued/tired throughout session) Behavior During Therapy: WFL for tasks assessed/performed Overall Cognitive Status: Within Functional Limits for tasks assessed                                        General Comments      Exercises     Assessment/Plan    PT Assessment Patient needs continued PT services  PT Problem List Decreased strength;Decreased mobility;Decreased safety awareness;Decreased range of motion;Decreased activity tolerance;Decreased balance;Pain       PT Treatment Interventions DME instruction;Therapeutic exercise;Gait training;Balance training;Stair training;Neuromuscular re-education;Functional mobility training;Therapeutic activities;Patient/family education    PT Goals (Current goals can be found in the Care Plan section)  Acute Rehab PT Goals Patient Stated Goal: to breathe better PT Goal Formulation: With patient Time For Goal Achievement: 02/13/19 Potential to Achieve Goals: Good    Frequency Min 2X/week   Barriers to discharge        Co-evaluation               AM-PAC PT "6 Clicks" Mobility  Outcome Measure Help needed turning from your back to your side while in a flat bed without using bedrails?: A Little Help needed moving from lying on your back to sitting on the side of a flat bed without using bedrails?: A Little Help needed moving to and from a bed to a chair (including a wheelchair)?: A Little Help needed standing up from a chair using your arms (e.g., wheelchair or bedside chair)?: A Little Help needed to walk in hospital room?: A Little Help needed climbing 3-5 steps with a railing? : A Lot 6 Click Score: 17    End of Session Equipment Utilized During Treatment: Gait belt Activity Tolerance: Patient limited by fatigue Patient left: in bed;with call bell/phone within  reach;with bed alarm set Nurse Communication: Mobility status PT Visit Diagnosis: Unsteadiness on feet (R26.81);Other abnormalities of gait and mobility (R26.89);Muscle weakness (generalized) (M62.81);Difficulty in walking, not elsewhere classified (R26.2)    Time: 1325-1350 PT Time Calculation (min) (ACUTE ONLY): 25 min   Charges:   PT Evaluation $PT Eval Low Complexity: 1 Low PT Treatments $Therapeutic Exercise: 8-22 mins       Lieutenant Diego PT, DPT 2:00 PM,01/30/19 [phone]

## 2019-01-30 NOTE — Discharge Summary (Addendum)
[facility] NAME: Shelly Freeman    MR#:  [id number]  DATE OF BIRTH:  01/02/1951  DATE OF ADMISSION:  01/28/2019 ADMITTING PHYSICIAN: Vaughan Basta, MD  DATE OF DISCHARGE: 01/30/2019  PRIMARY CARE PHYSICIAN: Theotis Burrow, MD    ADMISSION DIAGNOSIS:  COPD exacerbation ([place]) [J44.1] Acute respiratory failure with hypoxia ([place]) [J96.01]  DISCHARGE DIAGNOSIS:  Active Problems:   COPD with acute bronchitis (H[facility])   Acute bronchiolitis   SECONDARY DIAGNOSIS:   Past Medical History:  Diagnosis Date  . Arthritis   . Collagen vascular disease ([place])   . DDD (degenerative disc disease), lumbar 05/14/2016  . Depression   . Heart valve problem    per grandaughter need surgery but they told her may not survive;  [name] Hypertension   . Hypothyroidism   . Lupus ([place])   . Neuromuscular disorder ([place])   . Thyroid disease   . Vitamin D deficiency 05/14/2016    HOSPITAL COURSE:   68 year old Hispanic female with past medical history of lupus, pretension, hypothyroidism, depression, osteoarthritis who presented to the hospital due to shortness of breath and cough and generalized myalgias.  1.  Acute respiratory failure with hypoxia- patient presented to the hospital with shortness of breath cough and myalgias, her COVID-19 test was negative.  Chest x-ray did not show any evidence of acute process.   -Secondary to acute bronchitis.  Given her history of SLE underwent a CT scan of her chest which showed no evidence of the lymphadenopathy or pneumonitis or pulmonary fibrosis. -Clinically improved with IV steroids and some nebulizer treatments.  Will discharge home today on oral prednisone taper.  2.  Acute bronchitis-source of patient's worsening shortness of breath and cough. -Given her SLE patient underwent a CT of her chest which showed no acute pathology.  She improved with IV steroids and duo nebs.  Feels better.  Will  discharge home today with outpatient follow-up with rheumatologist.  3.  History of SLE- patient follows with rheumatology as an outpatient. -No acute flareup.  Patient's sed rate was normal. CRP was elevated.  Patient will continue her Plaquenil.  4.  Neuropathy- pt. Will continue gabapentin.  5.  Depression- pt. Will continue Cymbalta.  6.  Hypothyroidism- pt. Will continue Synthroid.    7.  Essential hypertension-continue Imdur  8. GERD - cont. Protonix.    Stable for discharge home today.   DISCHARGE CONDITIONS:   Stable  CONSULTS OBTAINED:  Treatment Team:  Ottie Glazier, MD  DRUG ALLERGIES:  No Known Allergies  DISCHARGE MEDICATIONS:   Allergies as of 01/30/2019   No Known Allergies     Medication List    TAKE these medications   acetaminophen 325 MG tablet Commonly known as: TYLENOL Take 650 mg by mouth every 6 (six) hours as needed.   albuterol 108 (90 Base) MCG/ACT inhaler Commonly known as: VENTOLIN HFA Inhale 2 puffs into the lungs every 6 (six) hours as needed for wheezing or shortness of breath.   alendronate 70 MG tablet Commonly known as: FOSAMAX Take 70 mg by mouth once a week. Take with a full glass of water on an empty stomach.   B COMPLEX PO Take 1 tablet by mouth daily.   busPIRone 10 MG tablet Commonly known as: BUSPAR Take 10 mg by mouth daily.   celecoxib 200 MG capsule Commonly known as: CELEBREX Take 200 mg by mouth daily.   docusate sodium 100 MG capsule Commonly known as:  COLACE Take 1 capsule (100 mg total) by mouth 2 (two) times daily.   doxycycline 100 MG tablet Commonly known as: VIBRA-TABS Take 1 tablet (100 mg total) by mouth 2 (two) times daily for 5 days.   DULoxetine 60 MG capsule Commonly known as: CYMBALTA Take 1 capsule (60 mg total) by mouth daily. What changed: how much to take   famotidine 20 MG tablet Commonly known as: PEPCID Take 1 tablet (20 mg total) by mouth 2 (two) times daily. What  changed: when to take this   ferrous sulfate 325 (65 FE) MG tablet Take 325 mg by mouth daily.   folic acid 1 MG tablet Commonly known as: FOLVITE Take 1 mg by mouth daily.   gabapentin 800 MG tablet Commonly known as: NEURONTIN Take 800 mg by mouth 2 (two) times daily.   hydroxychloroquine 200 MG tablet Commonly known as: Plaquenil Take 2 tablets (400 mg total) by mouth daily. What changed:   how much to take  when to take this   ibuprofen 200 MG tablet Commonly known as: ADVIL Take 400-800 mg by mouth every 6 (six) hours as needed for fever, mild pain or moderate pain.   ipratropium-albuterol 0.5-2.5 (3) MG/3ML Soln Commonly known as: DUONEB Take 3 mLs by nebulization every 6 (six) hours as needed (Wheezing).   isosorbide mononitrate 30 MG 24 hr tablet Commonly known as: IMDUR Take 30 mg by mouth daily.   levothyroxine 75 MCG tablet Commonly known as: SYNTHROID Take 75 mcg by mouth daily before breakfast.   LORazepam 0.5 MG tablet Commonly known as: ATIVAN Take 0.5 mg by mouth at bedtime.   metoprolol succinate 25 MG 24 hr tablet Commonly known as: TOPROL-XL Take 25 mg by mouth daily.   OLANZapine 15 MG tablet Commonly known as: ZYPREXA Take 1 tablet (15 mg total) by mouth at bedtime.   pantoprazole 40 MG tablet Commonly known as: PROTONIX Take 40 mg by mouth 2 (two) times daily.   predniSONE 10 MG tablet Commonly known as: DELTASONE Label  & dispense according to the schedule below. 5 Pills PO for 1 day then, 4 Pills PO for 1 day, 3 Pills PO for 1 day, 2 Pills PO for 1 day, 1 Pill PO for 1 days then STOP.   sucralfate 1 g tablet Commonly known as: CARAFATE Take 1 g by mouth 4 (four) times daily -  with meals and at bedtime.   traZODone 100 MG tablet Commonly known as: DESYREL Take 100 mg by mouth at bedtime.         DISCHARGE INSTRUCTIONS:   DIET:  Cardiac diet  DISCHARGE CONDITION:  Stable  ACTIVITY:  Activity as tolerated  OXYGEN:   Home Oxygen: No.   Oxygen Delivery: room air  DISCHARGE LOCATION:  home   If you experience worsening of your admission symptoms, develop shortness of breath, life threatening emergency, suicidal or homicidal thoughts you must seek medical attention immediately by calling 911 or calling your MD immediately  if symptoms less severe.  You Must read complete instructions/literature along with all the possible adverse reactions/side effects for all the Medicines you take and that have been prescribed to you. Take any new Medicines after you have completely understood and accpet all the possible adverse reactions/side effects.   Please note  You were cared for by a hospitalist during your hospital stay. If you have any questions about your discharge medications or the care you received while you were in the hospital after you  are discharged, you can call the unit and asked to speak with the hospitalist on call if the hospitalist that took care of you is not available. Once you are discharged, your primary care physician will handle any further medical issues. Please note that NO REFILLS for any discharge medications will be authorized once you are discharged, as it is imperative that you return to your primary care physician (or establish a relationship with a primary care physician if you do not have one) for your aftercare needs so that they can reassess your need for medications and monitor your lab values.     Today   No acute events overnight, shortness of breath is improved.  Patient denies any worsening myalgias or chest pain.  Will discharge home today.  VITAL SIGNS:  Blood pressure 136/66, pulse 90, temperature 97.8 F (36.6 C), temperature source Oral, resp. rate 17, height 5\' 1"  (1.549 m), weight 105.1 kg, SpO2 96 %.  I/O:    Intake/Output Summary (Last 24 hours) at 01/30/2019 1244 Last data filed at 01/29/2019 1700 Gross per 24 hour  Intake 240 ml  Output -  Net 240 ml     PHYSICAL EXAMINATION:   GENERAL:  68 y.o.-year-old patient lying in bed in no acute distress.  EYES: Pupils equal, round, reactive to light and accommodation. No scleral icterus. Extraocular muscles intact.  HEENT: Head atraumatic, normocephalic. Oropharynx and nasopharynx clear.  NECK:  Supple, no jugular venous distention. No thyroid enlargement, no tenderness.  LUNGS: Good a/e b/l, no wheezing, rales, rhonchi. No use of accessory muscles of respiration.  CARDIOVASCULAR: S1, S2 normal. No murmurs, rubs, or gallops.  ABDOMEN: Soft, nontender, nondistended. Bowel sounds present. No organomegaly or mass.  EXTREMITIES: No cyanosis, clubbing or edema b/l.    NEUROLOGIC: Cranial nerves II through XII are intact. No focal Motor or sensory deficits b/l.   PSYCHIATRIC: The patient is alert and oriented x 3.  SKIN: No obvious rash, lesion, or ulcer.   DATA REVIEW:   CBC Recent Labs  Lab 01/29/19 0426  WBC 10.7*  HGB 12.5  HCT 40.1  PLT 172    Chemistries  Recent Labs  Lab 01/29/19 0426  NA 142  K 3.8  CL 110  CO2 23  GLUCOSE 153*  BUN 12  CREATININE 0.55  CALCIUM 8.4*    Cardiac Enzymes No results for input(s): TROPONINI in the last 168 hours.  Microbiology Results  Results for orders placed or performed during the hospital encounter of 01/28/19  SARS Coronavirus 2 [facility] order, Performed in [facility] hospital lab) Nasopharyngeal Nasopharyngeal Swab     Status: None   Collection Time: 01/28/19 12:05 PM   Specimen: Nasopharyngeal Swab  Result Value Ref Range Status   SARS Coronavirus 2 NEGATIVE NEGATIVE Final    Comment: (NOTE) If result is NEGATIVE SARS-CoV-2 target nucleic acids are NOT DETECTED. The SARS-CoV-2 RNA is generally detectable in upper and lower  respiratory specimens during the acute phase of infection. The lowest  concentration of SARS-CoV-2 viral copies this assay can detect is 250  copies / mL. A negative result does not preclude SARS-CoV-2  infection  and should not be used as the sole basis for treatment or other  patient management decisions.  A negative result may occur with  improper specimen collection / handling, submission of specimen other  than nasopharyngeal swab, presence of viral mutation(s) within the  areas targeted by this assay, and inadequate number of viral copies  (<250 copies / mL).  A negative result must be combined with clinical  observations, patient history, and epidemiological information. If result is POSITIVE SARS-CoV-2 target nucleic acids are DETECTED. The SARS-CoV-2 RNA is generally detectable in upper and lower  respiratory specimens dur ing the acute phase of infection.  Positive  results are indicative of active infection with SARS-CoV-2.  Clinical  correlation with patient history and other diagnostic information is  necessary to determine patient infection status.  Positive results do  not rule out bacterial infection or co-infection with other viruses. If result is PRESUMPTIVE POSTIVE SARS-CoV-2 nucleic acids MAY BE PRESENT.   A presumptive positive result was obtained on the submitted specimen  and confirmed on repeat testing.  While 2019 novel coronavirus  (SARS-CoV-2) nucleic acids may be present in the submitted sample  additional confirmatory testing may be necessary for epidemiological  and / or clinical management purposes  to differentiate between  SARS-CoV-2 and other Sarbecovirus currently known to infect humans.  If clinically indicated additional testing with an alternate test  methodology [id number]) is advised. The SARS-CoV-2 RNA is generally  detectable in upper and lower respiratory sp ecimens during the acute  phase of infection. The expected result is Negative. Fact Sheet for Patients:  StrictlyIdeas.no Fact Sheet for Healthcare Providers: BankingDealers.co.za This test is not yet approved or cleared by the [place]  FDA and has been authorized for detection and/or diagnosis of SARS-CoV-2 by FDA under an Emergency Use Authorization (EUA).  This EUA will remain in effect (meaning this test can be used) for the duration of the COVID-19 declaration under Section 564(b)(1) of the Act, 21 U.S.C. section 360bbb-3(b)(1), unless the authorization is terminated or revoked sooner. Performed at [facility], [address] [place], [place] [address]     RADIOLOGY:  Ct Chest Wo Contrast  Result Date: 01/29/2019 CLINICAL DATA:  68 year old female with history of chest pain, shortness of breath and cough. History of lupus. EXAM: CT CHEST WITHOUT CONTRAST TECHNIQUE: Multidetector CT imaging of the chest was performed following the standard protocol without IV contrast. COMPARISON:  Chest CT 04/18/2017. FINDINGS: Cardiovascular: Heart size is normal. There is no significant pericardial fluid, thickening or pericardial calcification. There is aortic atherosclerosis, as well as atherosclerosis of the great vessels of the mediastinum and the coronary arteries, including calcified atherosclerotic plaque in the left anterior descending, left circumflex and right coronary arteries. Dilatation of the pulmonic trunk (3.5 cm in diameter). Mediastinum/Nodes: No pathologically enlarged mediastinal or hilar lymph nodes. Please note that accurate exclusion of hilar adenopathy is limited on noncontrast CT scans. Small hiatal hernia. No axillary lymphadenopathy. Lungs/Pleura: Small calcified granuloma in the left upper lobe. No other suspicious appearing pulmonary nodules or masses are noted. No acute consolidative airspace disease. No pleural effusions. Upper Abdomen: Postoperative changes of Roux-en-Y gastric bypass. Aortic atherosclerosis. Musculoskeletal: There are no aggressive appearing lytic or blastic lesions noted in the visualized portions of the skeleton. IMPRESSION: 1. No acute findings are noted in the thorax to account  for the patient's symptoms. 2. Dilatation of the pulmonic trunk, concerning for pulmonary arterial hypertension. 3. Aortic atherosclerosis, in addition to three-vessel coronary artery disease. Please note that although the presence of coronary artery calcium documents the presence of coronary artery disease, the severity of this disease and any potential stenosis cannot be assessed on this non-gated CT examination. Assessment for potential risk factor modification, dietary therapy or pharmacologic therapy may be warranted, if clinically indicated. 4. Additional incidental findings, as above. Aortic Atherosclerosis (ICD10-I70.0). Electronically  Signed   By: Vinnie Langton M.D.   On: 01/29/2019 13:32      Management plans discussed with the patient, family and they are in agreement.  CODE STATUS:     Code Status Orders  (From admission, onward)         Start     Ordered   01/28/19 2258  Full code  Continuous     01/28/19 2257       TOTAL TIME TAKING CARE OF THIS PATIENT: 40 minutes.    Henreitta Leber M.D on 01/30/2019 at 12:44 PM  Between 7am to 6pm - Pager - [phone]  After 6pm go to [email or web address] - Proofreader  [facility]  Office  [phone]  CC: Primary care physician; Theotis Burrow, MD

## 2019-03-18 ENCOUNTER — Other Ambulatory Visit: Payer: Self-pay | Admitting: Student

## 2019-03-18 DIAGNOSIS — R109 Unspecified abdominal pain: Secondary | ICD-10-CM

## 2019-03-18 DIAGNOSIS — R1013 Epigastric pain: Secondary | ICD-10-CM

## 2019-03-19 ENCOUNTER — Telehealth: Payer: Self-pay | Admitting: *Deleted

## 2019-03-23 ENCOUNTER — Telehealth: Payer: Self-pay | Admitting: *Deleted

## 2019-05-19 DIAGNOSIS — R0781 Pleurodynia: Secondary | ICD-10-CM | POA: Insufficient documentation

## 2019-05-19 DIAGNOSIS — R079 Chest pain, unspecified: Secondary | ICD-10-CM | POA: Insufficient documentation

## 2019-05-21 ENCOUNTER — Emergency Department: Payer: Medicare Other

## 2019-05-21 ENCOUNTER — Other Ambulatory Visit: Payer: Self-pay

## 2019-05-21 ENCOUNTER — Inpatient Hospital Stay
Admission: EM | Admit: 2019-05-21 | Discharge: 2019-05-25 | DRG: 690 | Disposition: A | Discharge status: Home Health | Payer: Medicare Other | Attending: Internal Medicine | Admitting: Internal Medicine

## 2019-05-21 DIAGNOSIS — N179 Acute kidney failure, unspecified: Secondary | ICD-10-CM | POA: Diagnosis present

## 2019-05-21 DIAGNOSIS — M810 Age-related osteoporosis without current pathological fracture: Secondary | ICD-10-CM | POA: Diagnosis present

## 2019-05-21 DIAGNOSIS — B9689 Other specified bacterial agents as the cause of diseases classified elsewhere: Secondary | ICD-10-CM | POA: Diagnosis present

## 2019-05-21 DIAGNOSIS — K219 Gastro-esophageal reflux disease without esophagitis: Secondary | ICD-10-CM | POA: Diagnosis present

## 2019-05-21 DIAGNOSIS — Z20822 Contact with and (suspected) exposure to covid-19: Secondary | ICD-10-CM | POA: Diagnosis present

## 2019-05-21 DIAGNOSIS — N12 Tubulo-interstitial nephritis, not specified as acute or chronic: Secondary | ICD-10-CM

## 2019-05-21 DIAGNOSIS — S2231XA Fracture of one rib, right side, initial encounter for closed fracture: Secondary | ICD-10-CM | POA: Diagnosis present

## 2019-05-21 DIAGNOSIS — A419 Sepsis, unspecified organism: Secondary | ICD-10-CM

## 2019-05-21 DIAGNOSIS — N390 Urinary tract infection, site not specified: Secondary | ICD-10-CM | POA: Diagnosis not present

## 2019-05-21 DIAGNOSIS — Z803 Family history of malignant neoplasm of breast: Secondary | ICD-10-CM

## 2019-05-21 DIAGNOSIS — Z79899 Other long term (current) drug therapy: Secondary | ICD-10-CM

## 2019-05-21 DIAGNOSIS — M549 Dorsalgia, unspecified: Secondary | ICD-10-CM

## 2019-05-21 DIAGNOSIS — E66813 Obesity, class 3: Secondary | ICD-10-CM | POA: Diagnosis present

## 2019-05-21 DIAGNOSIS — E860 Dehydration: Secondary | ICD-10-CM | POA: Diagnosis present

## 2019-05-21 DIAGNOSIS — Z6841 Body Mass Index (BMI) 40.0 and over, adult: Secondary | ICD-10-CM

## 2019-05-21 DIAGNOSIS — S2241XA Multiple fractures of ribs, right side, initial encounter for closed fracture: Secondary | ICD-10-CM | POA: Diagnosis present

## 2019-05-21 DIAGNOSIS — B9620 Unspecified Escherichia coli [E. coli] as the cause of diseases classified elsewhere: Secondary | ICD-10-CM | POA: Diagnosis present

## 2019-05-21 DIAGNOSIS — W19XXXA Unspecified fall, initial encounter: Secondary | ICD-10-CM | POA: Diagnosis present

## 2019-05-21 DIAGNOSIS — I2510 Atherosclerotic heart disease of native coronary artery without angina pectoris: Secondary | ICD-10-CM | POA: Diagnosis present

## 2019-05-21 DIAGNOSIS — R7881 Bacteremia: Secondary | ICD-10-CM | POA: Diagnosis present

## 2019-05-21 DIAGNOSIS — E039 Hypothyroidism, unspecified: Secondary | ICD-10-CM | POA: Diagnosis present

## 2019-05-21 DIAGNOSIS — F329 Major depressive disorder, single episode, unspecified: Secondary | ICD-10-CM | POA: Diagnosis present

## 2019-05-21 DIAGNOSIS — M329 Systemic lupus erythematosus, unspecified: Secondary | ICD-10-CM | POA: Diagnosis present

## 2019-05-21 DIAGNOSIS — I10 Essential (primary) hypertension: Secondary | ICD-10-CM | POA: Diagnosis present

## 2019-05-21 DIAGNOSIS — R6510 Systemic inflammatory response syndrome (SIRS) of non-infectious origin without acute organ dysfunction: Secondary | ICD-10-CM | POA: Diagnosis present

## 2019-05-21 DIAGNOSIS — F419 Anxiety disorder, unspecified: Secondary | ICD-10-CM | POA: Diagnosis present

## 2019-05-21 DIAGNOSIS — Z79890 Hormone replacement therapy (postmenopausal): Secondary | ICD-10-CM

## 2019-05-21 LAB — CBC WITH DIFFERENTIAL/PLATELET
Abs Immature Granulocytes: 1.41 10*3/uL — ABNORMAL HIGH (ref 0.00–0.07)
Basophils Absolute: 0.1 10*3/uL (ref 0.0–0.1)
Basophils Relative: 0 %
Eosinophils Absolute: 0 10*3/uL (ref 0.0–0.5)
Eosinophils Relative: 0 %
HCT: 39.7 % (ref 36.0–46.0)
Hemoglobin: 12.8 g/dL (ref 12.0–15.0)
Immature Granulocytes: 5 %
Lymphocytes Relative: 4 %
Lymphs Abs: 1.2 10*3/uL (ref 0.7–4.0)
MCH: 30.6 pg (ref 26.0–34.0)
MCHC: 32.2 g/dL (ref 30.0–36.0)
MCV: 95 fL (ref 80.0–100.0)
Monocytes Absolute: 2.2 10*3/uL — ABNORMAL HIGH (ref 0.1–1.0)
Monocytes Relative: 8 %
Neutro Abs: 23 10*3/uL — ABNORMAL HIGH (ref 1.7–7.7)
Neutrophils Relative %: 83 %
Platelets: 184 10*3/uL (ref 150–400)
RBC: 4.18 MIL/uL (ref 3.87–5.11)
RDW: 14.1 % (ref 11.5–15.5)
Smear Review: NORMAL
WBC: 27.8 10*3/uL — ABNORMAL HIGH (ref 4.0–10.5)
nRBC: 0 % (ref 0.0–0.2)

## 2019-05-21 LAB — URINALYSIS, ROUTINE W REFLEX MICROSCOPIC
Bilirubin Urine: NEGATIVE
Glucose, UA: NEGATIVE mg/dL
Hgb urine dipstick: NEGATIVE
Ketones, ur: NEGATIVE mg/dL
Nitrite: NEGATIVE
Protein, ur: 100 mg/dL — AB
Specific Gravity, Urine: 1.013 (ref 1.005–1.030)
WBC, UA: >50 WBC/hpf — ABNORMAL HIGH (ref 0–5)
pH: 5 (ref 5.0–8.0)

## 2019-05-21 LAB — COMPREHENSIVE METABOLIC PANEL WITH GFR
ALT: 11 U/L (ref 0–44)
AST: 17 U/L (ref 15–41)
Albumin: 3.3 g/dL — ABNORMAL LOW (ref 3.5–5.0)
Alkaline Phosphatase: 82 U/L (ref 38–126)
Anion gap: 9 (ref 5–15)
BUN: 29 mg/dL — ABNORMAL HIGH (ref 8–23)
CO2: 22 mmol/L (ref 22–32)
Calcium: 8.4 mg/dL — ABNORMAL LOW (ref 8.9–10.3)
Chloride: 108 mmol/L (ref 98–111)
Creatinine, Ser: 1.34 mg/dL — ABNORMAL HIGH (ref 0.44–1.00)
GFR calc Af Amer: 48 mL/min — ABNORMAL LOW
GFR calc non Af Amer: 41 mL/min — ABNORMAL LOW
Glucose, Bld: 135 mg/dL — ABNORMAL HIGH (ref 70–99)
Potassium: 4.2 mmol/L (ref 3.5–5.1)
Sodium: 139 mmol/L (ref 135–145)
Total Bilirubin: 1 mg/dL (ref 0.3–1.2)
Total Protein: 6.5 g/dL (ref 6.5–8.1)

## 2019-05-21 LAB — TROPONIN I (HIGH SENSITIVITY): Troponin I (High Sensitivity): 6 ng/L (ref <18)

## 2019-05-21 LAB — PROTIME-INR
INR: 1.1 (ref 0.8–1.2)
Prothrombin Time: 14.2 s (ref 11.4–15.2)

## 2019-05-21 LAB — CK: Total CK: 260 U/L — ABNORMAL HIGH (ref 38–234)

## 2019-05-21 LAB — RESPIRATORY PANEL BY RT PCR (FLU A&B, COVID)
Influenza A by PCR: NEGATIVE
Influenza B by PCR: NEGATIVE
SARS Coronavirus 2 by RT PCR: NEGATIVE

## 2019-05-21 LAB — LACTIC ACID, PLASMA: Lactic Acid, Venous: 1 mmol/L (ref 0.5–1.9)

## 2019-05-21 LAB — APTT: aPTT: 30 s (ref 24–36)

## 2019-05-21 MED ORDER — ACETAMINOPHEN 500 MG PO TABS
1000.0000 mg | ORAL_TABLET | Freq: Once | ORAL | Status: AC
  Administered 2019-05-21: 1000 mg via ORAL
  Filled 2019-05-21: qty 2

## 2019-05-21 MED ORDER — SODIUM CHLORIDE 0.9 % IV SOLN
2.0000 g | Freq: Once | INTRAVENOUS | Status: AC
  Administered 2019-05-21: 2 g via INTRAVENOUS
  Filled 2019-05-21: qty 20

## 2019-05-21 MED ORDER — LIDOCAINE 5 % EX PTCH
1.0000 | MEDICATED_PATCH | CUTANEOUS | Status: DC
  Administered 2019-05-22 – 2019-05-24 (×4): 1 via TRANSDERMAL
  Filled 2019-05-21 (×4): qty 1

## 2019-05-21 MED ORDER — SODIUM CHLORIDE 0.9 % IV BOLUS
1000.0000 mL | Freq: Once | INTRAVENOUS | Status: AC
  Administered 2019-05-21: 1000 mL via INTRAVENOUS

## 2019-05-21 MED ORDER — LACTATED RINGERS IV BOLUS
1000.0000 mL | Freq: Once | INTRAVENOUS | Status: AC
  Administered 2019-05-21: 1000 mL via INTRAVENOUS

## 2019-05-21 NOTE — ED Provider Notes (Signed)
[facility] Emergency Department Provider Note  ____________________________________________   First MD Initiated Contact with Patient 05/21/19 2038     (approximate)  I have reviewed the triage vital signs and the nursing notes.   HISTORY  Chief Complaint Fall    HPI Shelly Freeman is a 69 y.o. female with arthritis, depression, hypertension who comes in after a fall.  Patient reportedly had rib fractures and was on tramadol.  Got 20 pills on the 30th and just ran out.  She was then found laying on the ground again today by family member.  Approximately the ground for 5 hours.  Patient having pain in her chest and the side of her chest and along her entire back.  Pain is moderate, constant, worse with moving, better at rest.  Patient was seen on 1/12 by rheumatology for the continued pain.  Patient was prescribed Flexeril, Norco and Voltaren gel.  Patient states that she had another fall today which she fell backwards.  Does not sound like she had loss of consciousness.  She denies feeling short of breath.  Denies any known coronavirus contacts.          Past Medical History:  Diagnosis Date  . Arthritis   . Collagen vascular disease ([place])   . DDD (degenerative disc disease), lumbar 05/14/2016  . Depression   . Heart valve problem    per grandaughter need surgery but they told her may not survive;  [name] Hypertension   . Hypothyroidism   . Lupus ([place])   . Neuromuscular disorder ([place])   . Thyroid disease   . Vitamin D deficiency 05/14/2016    Patient Active Problem List   Diagnosis Date Noted  . Acute bronchiolitis 01/28/2019  . Left anterior knee pain 10/24/2016  . Right upper quadrant pain 10/24/2016  . Urination pain 06/26/2016  . DDD (degenerative disc disease), lumbar 05/14/2016  . Lipoma of skin 05/14/2016  . Vitamin D deficiency, unspecified 05/14/2016  . Major neurocognitive disorder, due to vascular disease, with behavioral  disturbance, mild ([place]) 04/15/2016  . Severe recurrent major depression with psychotic features ([place]) 04/04/2016  . Lupus ([place]) 04/04/2016  . GERD (gastroesophageal reflux disease) 04/04/2016  . COPD with acute bronchitis ([place]) 04/04/2016  . Acute respiratory failure with hypoxia ([place]) 04/03/2016  . Encephalopathy, metabolic 0000000  . Hypokalemia 04/03/2016  . Pain in limb 04/03/2016  . Hypothyroidism 04/03/2016  . Essential hypertension 04/03/2016  . Intentional opiate overdose ([place]) 03/30/2016  . Age related osteoporosis 03/19/2016  . Chronic midline low back pain without sciatica 03/19/2016  . Encounter for long-term (current) use of high-risk medication 03/19/2016  . Primary osteoarthritis of both knees 03/19/2016    Past Surgical History:  Procedure Laterality Date  . APPENDECTOMY    . BREAST BIOPSY Left    neg  . BREAST LUMPECTOMY Left   . CESAREAN SECTION    . COLONOSCOPY WITH PROPOFOL N/A 07/01/2017   Procedure: COLONOSCOPY WITH PROPOFOL;  Surgeon: Manya Silvas, MD;  Location: [facility] ENDOSCOPY;  Service: Endoscopy;  Laterality: N/A;  . ESOPHAGOGASTRODUODENOSCOPY (EGD) WITH PROPOFOL N/A 07/01/2017   Procedure: ESOPHAGOGASTRODUODENOSCOPY (EGD) WITH PROPOFOL;  Surgeon: Manya Silvas, MD;  Location: [facility] ENDOSCOPY;  Service: Endoscopy;  Laterality: N/A;  . GASTRIC BYPASS    . TUBAL LIGATION      Prior to Admission medications   Medication Sig Start Date End Date Taking? Authorizing Provider  acetaminophen (TYLENOL) 325 MG tablet Take 650 mg by mouth every 6 (six)  hours as needed.    [provider]  albuterol (PROVENTIL HFA;VENTOLIN HFA) 108 (90 Base) MCG/ACT inhaler Inhale 2 puffs into the lungs every 6 (six) hours as needed for wheezing or shortness of breath.    [provider]  alendronate (FOSAMAX) 70 MG tablet Take 70 mg by mouth once a week. Take with a full glass of water on an empty stomach.    [provider]  B Complex Vitamins  (B COMPLEX PO) Take 1 tablet by mouth daily.     [provider]  busPIRone (BUSPAR) 10 MG tablet Take 10 mg by mouth daily.    [provider]  celecoxib (CELEBREX) 200 MG capsule Take 200 mg by mouth daily.     [provider]  docusate sodium (COLACE) 100 MG capsule Take 1 capsule (100 mg total) by mouth 2 (two) times daily. 01/30/19   Henreitta Leber, MD  DULoxetine (CYMBALTA) 60 MG capsule Take 1 capsule (60 mg total) by mouth daily. Patient taking differently: Take 30 mg by mouth daily.  04/17/16   Pucilowska, Herma Ard B, MD  famotidine (PEPCID) 20 MG tablet Take 1 tablet (20 mg total) by mouth 2 (two) times daily. Patient taking differently: Take 20 mg by mouth daily.  04/16/16   Pucilowska, Herma Ard B, MD  ferrous sulfate 325 (65 FE) MG tablet Take 325 mg by mouth daily.    [provider]  folic acid (FOLVITE) 1 MG tablet Take 1 mg by mouth daily.    [provider]  gabapentin (NEURONTIN) 800 MG tablet Take 800 mg by mouth 2 (two) times daily.    [provider]  hydroxychloroquine (PLAQUENIL) 200 MG tablet Take 2 tablets (400 mg total) by mouth daily. Patient taking differently: Take 200 mg by mouth 2 (two) times daily.  04/04/16   Theodoro Grist, MD  ibuprofen (ADVIL) 200 MG tablet Take 400-800 mg by mouth every 6 (six) hours as needed for fever, mild pain or moderate pain.    [provider]  ipratropium-albuterol (DUONEB) 0.5-2.5 (3) MG/3ML SOLN Take 3 mLs by nebulization every 6 (six) hours as needed (Wheezing). 01/30/19   Henreitta Leber, MD  isosorbide mononitrate (IMDUR) 30 MG 24 hr tablet Take 30 mg by mouth daily.    [provider]  levothyroxine (SYNTHROID, LEVOTHROID) 75 MCG tablet Take 75 mcg by mouth daily before breakfast.    [provider]  LORazepam (ATIVAN) 0.5 MG tablet Take 0.5 mg by mouth at bedtime.    [provider]  metoprolol succinate (TOPROL-XL) 25 MG 24 hr tablet Take 25  mg by mouth daily.     [provider]  OLANZapine (ZYPREXA) 15 MG tablet Take 1 tablet (15 mg total) by mouth at bedtime. 04/16/16   Pucilowska, Jolanta B, MD  pantoprazole (PROTONIX) 40 MG tablet Take 40 mg by mouth 2 (two) times daily.     [provider]  predniSONE (DELTASONE) 10 MG tablet Label  & dispense according to the schedule below. 5 Pills PO for 1 day then, 4 Pills PO for 1 day, 3 Pills PO for 1 day, 2 Pills PO for 1 day, 1 Pill PO for 1 days then STOP. 01/30/19   Henreitta Leber, MD  sucralfate (CARAFATE) 1 g tablet Take 1 g by mouth 4 (four) times daily -  with meals and at bedtime.     [provider]  traZODone (DESYREL) 100 MG tablet Take 100 mg by mouth at  bedtime.    [provider]    Allergies Patient has no known allergies.  Family History  Problem Relation Age of Onset  . Breast cancer Maternal Aunt   . Breast cancer Cousin     Social History Social History   Tobacco Use  . Smoking status: Never Smoker  . Smokeless tobacco: Never Used  Substance Use Topics  . Alcohol use: No  . Drug use: No      Review of Systems Constitutional: No fever/chills Eyes: No visual changes. ENT: No sore throat. Cardiovascular: Positive chest pain Respiratory: Denies shortness of breath. Gastrointestinal: No abdominal pain.  No nausea, no vomiting.  No diarrhea.  No constipation. Genitourinary: Negative for dysuria. Musculoskeletal: Positive back pain Skin: Negative for rash. Neurological: Negative for headaches, focal weakness or numbness. All other ROS negative ____________________________________________   PHYSICAL EXAM:  VITAL SIGNS: ED Triage Vitals [05/21/19 2047]  Enc Vitals Group     BP (!) 123/56     Pulse      Resp 18     Temp      Temp src      SpO2      Weight      Height      Head Circumference      Peak Flow      Pain Score      Pain Loc      Pain Edu?      Excl. in [place]?     Constitutional: Alert and  oriented. Well appearing and in no acute distress. Eyes: Conjunctivae are normal. EOMI. Head: Atraumatic. Nose: No congestion/rhinnorhea. Mouth/Throat: Mucous membranes are moist.   Neck: No stridor. Trachea Midline. FROM Cardiovascular: Tachycardic, regular rhythm. Grossly normal heart sounds.  Good peripheral circulation.  Chest wall tenderness Respiratory: Normal respiratory effort.  No retractions. Lungs CTAB. Gastrointestinal: Soft with slight tenderness.. No distention. No abdominal bruits.  Musculoskeletal: No lower extremity tenderness nor edema.  No joint effusions. Neurologic:  Normal speech and language. No gross focal neurologic deficits are appreciated.  Skin:  Skin is warm, dry and intact. No rash noted. Psychiatric: Mood and affect are normal. Speech and behavior are normal. GU: Deferred  Back: CTL-spine tenderness ____________________________________________   LABS (all labs ordered are listed, but only abnormal results are displayed)  Labs Reviewed  CBC WITH DIFFERENTIAL/PLATELET - Abnormal; Notable for the following components:      Result Value   WBC 27.8 (*)    All other components within normal limits  URINALYSIS, ROUTINE W REFLEX MICROSCOPIC - Abnormal; Notable for the following components:   Color, Urine YELLOW (*)    APPearance CLOUDY (*)    Protein, ur 100 (*)    Leukocytes,Ua MODERATE (*)    WBC, UA >50 (*)    Bacteria, UA MANY (*)    All other components within normal limits  CULTURE, BLOOD (ROUTINE X 2)  CULTURE, BLOOD (ROUTINE X 2)  RESPIRATORY PANEL BY RT PCR (FLU A&B, COVID)  PROTIME-INR  APTT  COMPREHENSIVE METABOLIC PANEL  CK  LACTIC ACID, PLASMA  LACTIC ACID, PLASMA  TROPONIN I (HIGH SENSITIVITY)   ____________________________________________   ED ECG REPORT I, Vanessa [place], the attending physician, personally viewed and interpreted this ECG.  EKG is sinus tachycardia rate of 101, no ST elevations, no T wave inversions, normal  intervals ____________________________________________  RADIOLOGY Robert Bellow, personally viewed and evaluated these images (plain radiographs) as part of my medical decision making, as well as reviewing the  written report by the radiologist.  ED MD interpretation:  Pending   Official radiology report(s): CT Head Wo Contrast  Result Date: 05/21/2019 CLINICAL DATA:  [place] from bed EXAM: CT HEAD WITHOUT CONTRAST CT CERVICAL SPINE WITHOUT CONTRAST TECHNIQUE: Multidetector CT imaging of the head and cervical spine was performed following the standard protocol without intravenous contrast. Multiplanar CT image reconstructions of the cervical spine were also generated. COMPARISON:  CT head 04/15/2017 FINDINGS: CT HEAD FINDINGS Brain: No evidence of acute infarction, hemorrhage, hydrocephalus, extra-axial collection or mass lesion/mass effect. Symmetric prominence of the ventricles, cisterns and sulci compatible with parenchymal volume loss. Mixed patchy and confluent areas of white matter hypoattenuation are most compatible with moderate chronic microvascular angiopathy, overall extent of which is similar to comparison. Vascular: Atherosclerotic calcification of the carotid siphons and intradural vertebral arteries. No hyperdense vessel. Skull: There is some mild left frontal scalp swelling without large hematoma or subjacent calvarial fracture. Benign hyperostosis frontalis interna is incidentally noted. Sinuses/Orbits: Paranasal sinuses and mastoid air cells are predominantly clear. Included orbital structures are unremarkable. Other: None CT CERVICAL SPINE FINDINGS Alignment: Preservation of the normal cervical lordosis without traumatic listhesis. No abnormal facet widening. Normal alignment of the craniocervical and atlantoaxial articulations. Skull base and vertebrae: No acute fracture. No primary bone lesion or focal pathologic process. Soft tissues and spinal canal: No pre or paravertebral fluid or  swelling. No visible canal hematoma. Disc levels: Minimal diffuse intervertebral disc height loss with early spondylitic endplate changes, maximal C6-7. no significant neural foraminal stenosis or spinal canal narrowing. Upper chest: No acute abnormality in the upper chest or imaged lung apices. Dedicated CT of the chest was performed concurrently. Other: Atherosclerotic calcification of the cervical carotid arteries. IMPRESSION: 1. Mild left frontal scalp swelling without large hematoma or subjacent calvarial fracture. 2. No acute intracranial abnormality. 3. Stable parenchymal volume loss and chronic microvascular ischemic white matter disease. 4. No acute cervical spine fracture or traumatic listhesis. 5. Minimal cervical spondylitic changes. Electronically Signed   By: Lovena Le M.D.   On: 05/21/2019 21:52   CT Cervical Spine Wo Contrast  Result Date: 05/21/2019 CLINICAL DATA:  [place] from bed EXAM: CT HEAD WITHOUT CONTRAST CT CERVICAL SPINE WITHOUT CONTRAST TECHNIQUE: Multidetector CT imaging of the head and cervical spine was performed following the standard protocol without intravenous contrast. Multiplanar CT image reconstructions of the cervical spine were also generated. COMPARISON:  CT head 04/15/2017 FINDINGS: CT HEAD FINDINGS Brain: No evidence of acute infarction, hemorrhage, hydrocephalus, extra-axial collection or mass lesion/mass effect. Symmetric prominence of the ventricles, cisterns and sulci compatible with parenchymal volume loss. Mixed patchy and confluent areas of white matter hypoattenuation are most compatible with moderate chronic microvascular angiopathy, overall extent of which is similar to comparison. Vascular: Atherosclerotic calcification of the carotid siphons and intradural vertebral arteries. No hyperdense vessel. Skull: There is some mild left frontal scalp swelling without large hematoma or subjacent calvarial fracture. Benign hyperostosis frontalis interna is incidentally  noted. Sinuses/Orbits: Paranasal sinuses and mastoid air cells are predominantly clear. Included orbital structures are unremarkable. Other: None CT CERVICAL SPINE FINDINGS Alignment: Preservation of the normal cervical lordosis without traumatic listhesis. No abnormal facet widening. Normal alignment of the craniocervical and atlantoaxial articulations. Skull base and vertebrae: No acute fracture. No primary bone lesion or focal pathologic process. Soft tissues and spinal canal: No pre or paravertebral fluid or swelling. No visible canal hematoma. Disc levels: Minimal diffuse intervertebral disc height loss with early spondylitic endplate changes, maximal C6-7.  no significant neural foraminal stenosis or spinal canal narrowing. Upper chest: No acute abnormality in the upper chest or imaged lung apices. Dedicated CT of the chest was performed concurrently. Other: Atherosclerotic calcification of the cervical carotid arteries. IMPRESSION: 1. Mild left frontal scalp swelling without large hematoma or subjacent calvarial fracture. 2. No acute intracranial abnormality. 3. Stable parenchymal volume loss and chronic microvascular ischemic white matter disease. 4. No acute cervical spine fracture or traumatic listhesis. 5. Minimal cervical spondylitic changes. Electronically Signed   By: Lovena Le M.D.   On: 05/21/2019 21:52    ____________________________________________   PROCEDURES  Procedure(s) performed (including Critical Care):  Procedures   ____________________________________________   INITIAL IMPRESSION / ASSESSMENT AND PLAN / ED COURSE  Shelly Freeman was evaluated in Emergency Department on 05/21/2019 for the symptoms described in the history of present illness. She was evaluated in the context of the global COVID-19 pandemic, which necessitated consideration that the patient might be at risk for infection with the SARS-CoV-2 virus that causes COVID-19. Institutional protocols and  algorithms that pertain to the evaluation of patients at risk for COVID-19 are in a state of rapid change based on information released by regulatory bodies including the CDC and federal and state organizations. These policies and algorithms were followed during the patient's care in the ED.    Patient is a 69 year old who presents for fall.  Patient was on tramadol and they got a prescription for oxycodone and Flexeril which seemed they are not pulling up the database but she has an empty bottle of the tramadol with her.  Patient did hit her head will get CT head evaluate for intracranial hemorrhage.  Will get CT cervical to evaluate for cervical fracture given the chest wall pain will get CT scan to evaluate for sternal fractures, rib fractures and CT abdomen to evaluate for any acute pathology.  She does not feel short of breath and her chest pain is reproducible in nature so do not think that this is a pulmonary embolism.  Patient was able to ambulate without assistance to the bathroom.  Attempt to call family member but nobody picked up.  White count elevated. Handed off to oncoming doc. Pending CT scans. Anticipate admission.          ____________________________________________   FINAL CLINICAL IMPRESSION(S) / ED DIAGNOSES   Final diagnoses:  Back pain  Back pain      MEDICATIONS GIVEN DURING THIS VISIT:  Medications  acetaminophen (TYLENOL) tablet 1,000 mg (1,000 mg Oral Given 05/21/19 2150)  sodium chloride 0.9 % bolus 1,000 mL (1,000 mLs Intravenous New Bag/Given 05/21/19 2147)     ED Discharge Orders    None       Note:  This document was prepared using Dragon voice recognition software and may include unintentional dictation errors.   Vanessa Wantagh, MD 05/22/19 1501

## 2019-05-21 NOTE — ED Triage Notes (Signed)
Pt broke her ribs a few weeks ago and fell out of bed today per caregiver. C/o her rib pain.

## 2019-05-21 NOTE — ED Provider Notes (Signed)
-----------------------------------------   10:18 PM on 05/21/2019 -----------------------------------------  Blood pressure (!) 123/56, pulse (!) 105, temperature 98.6 F (37 C), resp. rate 18, SpO2 95 %.  Assuming care from Dr. Jari Pigg.  In short, Shelly Freeman is a 69 y.o. female with a chief complaint of Fall .  Refer to the original H&P for additional details.  The current plan of care is to follow-up imaging for generalized weakness and fall. Patient does have apparent UTI and meets sepsis criteria.  CT imaging is negative for sequela of trauma, does show hydronephrosis but no evidence of obstructive process.  Patient likely with sepsis secondary to pyelonephritis, will treat with Rocephin and culture urine.  Case discussed with hospitalist, who accepts patient for admission.    Blake Divine, MD 05/21/19 [phone]

## 2019-05-22 ENCOUNTER — Encounter: Payer: Self-pay | Admitting: Family Medicine

## 2019-05-22 ENCOUNTER — Other Ambulatory Visit: Payer: Self-pay

## 2019-05-22 DIAGNOSIS — W19XXXA Unspecified fall, initial encounter: Secondary | ICD-10-CM | POA: Diagnosis present

## 2019-05-22 DIAGNOSIS — F329 Major depressive disorder, single episode, unspecified: Secondary | ICD-10-CM | POA: Diagnosis present

## 2019-05-22 DIAGNOSIS — B9689 Other specified bacterial agents as the cause of diseases classified elsewhere: Secondary | ICD-10-CM | POA: Diagnosis present

## 2019-05-22 DIAGNOSIS — N179 Acute kidney failure, unspecified: Secondary | ICD-10-CM | POA: Diagnosis present

## 2019-05-22 DIAGNOSIS — R7881 Bacteremia: Secondary | ICD-10-CM | POA: Diagnosis present

## 2019-05-22 DIAGNOSIS — A4150 Gram-negative sepsis, unspecified: Secondary | ICD-10-CM

## 2019-05-22 DIAGNOSIS — N390 Urinary tract infection, site not specified: Principal | ICD-10-CM

## 2019-05-22 DIAGNOSIS — E039 Hypothyroidism, unspecified: Secondary | ICD-10-CM

## 2019-05-22 DIAGNOSIS — M329 Systemic lupus erythematosus, unspecified: Secondary | ICD-10-CM | POA: Diagnosis present

## 2019-05-22 DIAGNOSIS — I10 Essential (primary) hypertension: Secondary | ICD-10-CM | POA: Diagnosis present

## 2019-05-22 DIAGNOSIS — Z79899 Other long term (current) drug therapy: Secondary | ICD-10-CM | POA: Diagnosis not present

## 2019-05-22 DIAGNOSIS — R6520 Severe sepsis without septic shock: Secondary | ICD-10-CM

## 2019-05-22 DIAGNOSIS — K219 Gastro-esophageal reflux disease without esophagitis: Secondary | ICD-10-CM | POA: Diagnosis present

## 2019-05-22 DIAGNOSIS — Z6841 Body Mass Index (BMI) 40.0 and over, adult: Secondary | ICD-10-CM | POA: Diagnosis not present

## 2019-05-22 DIAGNOSIS — N12 Tubulo-interstitial nephritis, not specified as acute or chronic: Secondary | ICD-10-CM

## 2019-05-22 DIAGNOSIS — R6510 Systemic inflammatory response syndrome (SIRS) of non-infectious origin without acute organ dysfunction: Secondary | ICD-10-CM | POA: Diagnosis not present

## 2019-05-22 DIAGNOSIS — Z79890 Hormone replacement therapy (postmenopausal): Secondary | ICD-10-CM | POA: Diagnosis not present

## 2019-05-22 DIAGNOSIS — Z20822 Contact with and (suspected) exposure to covid-19: Secondary | ICD-10-CM | POA: Diagnosis present

## 2019-05-22 DIAGNOSIS — M810 Age-related osteoporosis without current pathological fracture: Secondary | ICD-10-CM | POA: Diagnosis present

## 2019-05-22 DIAGNOSIS — Z803 Family history of malignant neoplasm of breast: Secondary | ICD-10-CM | POA: Diagnosis not present

## 2019-05-22 DIAGNOSIS — Y92009 Unspecified place in unspecified non-institutional (private) residence as the place of occurrence of the external cause: Secondary | ICD-10-CM

## 2019-05-22 DIAGNOSIS — A419 Sepsis, unspecified organism: Secondary | ICD-10-CM

## 2019-05-22 DIAGNOSIS — I2510 Atherosclerotic heart disease of native coronary artery without angina pectoris: Secondary | ICD-10-CM | POA: Diagnosis present

## 2019-05-22 DIAGNOSIS — B9620 Unspecified Escherichia coli [E. coli] as the cause of diseases classified elsewhere: Secondary | ICD-10-CM | POA: Diagnosis present

## 2019-05-22 DIAGNOSIS — E860 Dehydration: Secondary | ICD-10-CM | POA: Diagnosis present

## 2019-05-22 DIAGNOSIS — M549 Dorsalgia, unspecified: Secondary | ICD-10-CM | POA: Diagnosis present

## 2019-05-22 DIAGNOSIS — S2241XA Multiple fractures of ribs, right side, initial encounter for closed fracture: Secondary | ICD-10-CM | POA: Diagnosis present

## 2019-05-22 DIAGNOSIS — F419 Anxiety disorder, unspecified: Secondary | ICD-10-CM | POA: Diagnosis present

## 2019-05-22 LAB — BLOOD CULTURE ID PANEL (REFLEXED)

## 2019-05-22 LAB — BASIC METABOLIC PANEL
Anion gap: 8 (ref 5–15)
BUN: 23 mg/dL (ref 8–23)
CO2: 19 mmol/L — ABNORMAL LOW (ref 22–32)
Calcium: 7.6 mg/dL — ABNORMAL LOW (ref 8.9–10.3)
Chloride: 112 mmol/L — ABNORMAL HIGH (ref 98–111)
Creatinine, Ser: 0.9 mg/dL (ref 0.44–1.00)
GFR calc Af Amer: >60 mL/min (ref >60)
GFR calc non Af Amer: >60 mL/min (ref >60)
Glucose, Bld: 91 mg/dL (ref 70–99)
Potassium: 3.8 mmol/L (ref 3.5–5.1)
Sodium: 139 mmol/L (ref 135–145)

## 2019-05-22 LAB — CBC
HCT: 36.5 % (ref 36.0–46.0)
Hemoglobin: 12 g/dL (ref 12.0–15.0)
MCH: 30.9 pg (ref 26.0–34.0)
MCHC: 32.9 g/dL (ref 30.0–36.0)
MCV: 94.1 fL (ref 80.0–100.0)
Platelets: 149 10*3/uL — ABNORMAL LOW (ref 150–400)
RBC: 3.88 MIL/uL (ref 3.87–5.11)
RDW: 14.2 % (ref 11.5–15.5)
WBC: 17.1 10*3/uL — ABNORMAL HIGH (ref 4.0–10.5)
nRBC: 0 % (ref 0.0–0.2)

## 2019-05-22 LAB — TSH: TSH: 1.763 u[IU]/mL (ref 0.350–4.500)

## 2019-05-22 LAB — TROPONIN I (HIGH SENSITIVITY): Troponin I (High Sensitivity): 8 ng/L (ref <18)

## 2019-05-22 MED ORDER — SENNOSIDES-DOCUSATE SODIUM 8.6-50 MG PO TABS
2.0000 | ORAL_TABLET | Freq: Two times a day (BID) | ORAL | Status: DC
  Administered 2019-05-22 – 2019-05-25 (×6): 2 via ORAL
  Filled 2019-05-22 (×7): qty 2

## 2019-05-22 MED ORDER — ACETAMINOPHEN 500 MG PO TABS
ORAL_TABLET | ORAL | Status: AC
  Filled 2019-05-22: qty 2

## 2019-05-22 MED ORDER — CELECOXIB 200 MG PO CAPS
200.0000 mg | ORAL_CAPSULE | Freq: Every day | ORAL | Status: DC
  Administered 2019-05-22 – 2019-05-25 (×4): 200 mg via ORAL
  Filled 2019-05-22 (×4): qty 1

## 2019-05-22 MED ORDER — ALENDRONATE SODIUM 70 MG PO TABS: 70.0000 mg | ORAL_TABLET | ORAL | Status: DC

## 2019-05-22 MED ORDER — POLYETHYLENE GLYCOL 3350 17 G PO PACK
17.0000 g | PACK | Freq: Every day | ORAL | Status: DC
  Administered 2019-05-22 – 2019-05-25 (×4): 17 g via ORAL
  Filled 2019-05-22 (×4): qty 1

## 2019-05-22 MED ORDER — ACETAMINOPHEN 650 MG RE SUPP: 650.0000 mg | Freq: Four times a day (QID) | RECTAL | Status: DC | PRN

## 2019-05-22 MED ORDER — SODIUM CHLORIDE 0.9 % IV SOLN
1.0000 g | Freq: Three times a day (TID) | INTRAVENOUS | Status: DC
  Administered 2019-05-22 – 2019-05-23 (×4): 1 g via INTRAVENOUS
  Filled 2019-05-22 (×7): qty 1

## 2019-05-22 MED ORDER — TRAMADOL HCL 50 MG PO TABS
50.0000 mg | ORAL_TABLET | Freq: Three times a day (TID) | ORAL | Status: DC | PRN
  Administered 2019-05-22 (×2): 50 mg via ORAL
  Filled 2019-05-22 (×2): qty 1

## 2019-05-22 MED ORDER — ISOSORBIDE MONONITRATE ER 30 MG PO TB24
30.0000 mg | ORAL_TABLET | Freq: Every day | ORAL | Status: DC
  Administered 2019-05-22 – 2019-05-25 (×4): 30 mg via ORAL
  Filled 2019-05-22 (×4): qty 1

## 2019-05-22 MED ORDER — ACETAMINOPHEN 325 MG PO TABS
650.0000 mg | ORAL_TABLET | Freq: Four times a day (QID) | ORAL | Status: DC | PRN
  Administered 2019-05-22: 650 mg via ORAL
  Filled 2019-05-22: qty 2

## 2019-05-22 MED ORDER — ONDANSETRON HCL 4 MG PO TABS: 4.0000 mg | ORAL_TABLET | Freq: Four times a day (QID) | ORAL | Status: DC | PRN

## 2019-05-22 MED ORDER — SODIUM CHLORIDE 0.9 % IV BOLUS
1000.0000 mL | Freq: Once | INTRAVENOUS | Status: AC
  Administered 2019-05-22: 1000 mL via INTRAVENOUS

## 2019-05-22 MED ORDER — LORAZEPAM 0.5 MG PO TABS
0.5000 mg | ORAL_TABLET | Freq: Every day | ORAL | Status: DC
  Administered 2019-05-22 – 2019-05-24 (×3): 0.5 mg via ORAL
  Filled 2019-05-22 (×3): qty 1

## 2019-05-22 MED ORDER — OLANZAPINE 5 MG PO TABS
15.0000 mg | ORAL_TABLET | Freq: Every day | ORAL | Status: DC
  Administered 2019-05-22 – 2019-05-24 (×3): 15 mg via ORAL
  Filled 2019-05-22 (×3): qty 3

## 2019-05-22 MED ORDER — TRAZODONE HCL 50 MG PO TABS: 25.0000 mg | ORAL_TABLET | Freq: Every evening | ORAL | Status: DC | PRN

## 2019-05-22 MED ORDER — ONDANSETRON HCL 4 MG/2ML IJ SOLN: 4.0000 mg | Freq: Four times a day (QID) | INTRAMUSCULAR | Status: DC | PRN

## 2019-05-22 MED ORDER — HYDROXYCHLOROQUINE SULFATE 200 MG PO TABS
200.0000 mg | ORAL_TABLET | Freq: Two times a day (BID) | ORAL | Status: DC
  Administered 2019-05-22 – 2019-05-25 (×8): 200 mg via ORAL
  Filled 2019-05-22 (×9): qty 1

## 2019-05-22 MED ORDER — LEVOTHYROXINE SODIUM 50 MCG PO TABS
75.0000 ug | ORAL_TABLET | Freq: Every day | ORAL | Status: DC
  Administered 2019-05-22 – 2019-05-25 (×4): 75 ug via ORAL
  Filled 2019-05-22: qty 2
  Filled 2019-05-22 (×3): qty 1

## 2019-05-22 MED ORDER — TRAZODONE HCL 100 MG PO TABS
100.0000 mg | ORAL_TABLET | Freq: Every day | ORAL | Status: DC
  Administered 2019-05-22 – 2019-05-24 (×3): 100 mg via ORAL
  Filled 2019-05-22 (×3): qty 1

## 2019-05-22 MED ORDER — DULOXETINE HCL 30 MG PO CPEP
30.0000 mg | ORAL_CAPSULE | Freq: Every day | ORAL | Status: DC
  Administered 2019-05-22 – 2019-05-25 (×4): 30 mg via ORAL
  Filled 2019-05-22 (×4): qty 1

## 2019-05-22 MED ORDER — PANTOPRAZOLE SODIUM 40 MG PO TBEC
40.0000 mg | DELAYED_RELEASE_TABLET | Freq: Two times a day (BID) | ORAL | Status: DC
  Administered 2019-05-22 – 2019-05-25 (×8): 40 mg via ORAL
  Filled 2019-05-22 (×8): qty 1

## 2019-05-22 MED ORDER — GABAPENTIN 100 MG PO CAPS
400.0000 mg | ORAL_CAPSULE | Freq: Two times a day (BID) | ORAL | Status: DC
  Administered 2019-05-22 – 2019-05-23 (×4): 400 mg via ORAL
  Filled 2019-05-22 (×5): qty 4

## 2019-05-22 MED ORDER — ENOXAPARIN SODIUM 40 MG/0.4ML ~~LOC~~ SOLN
40.0000 mg | Freq: Two times a day (BID) | SUBCUTANEOUS | Status: DC
  Administered 2019-05-22 – 2019-05-25 (×8): 40 mg via SUBCUTANEOUS
  Filled 2019-05-22 (×9): qty 0.4

## 2019-05-22 MED ORDER — DOCUSATE SODIUM 100 MG PO CAPS
100.0000 mg | ORAL_CAPSULE | Freq: Two times a day (BID) | ORAL | Status: DC
  Administered 2019-05-22: 100 mg via ORAL
  Filled 2019-05-22: qty 1

## 2019-05-22 MED ORDER — SODIUM CHLORIDE 0.9 % IV SOLN: 1.0000 g | INTRAVENOUS | Status: DC

## 2019-05-22 MED ORDER — ADULT MULTIVITAMIN W/MINERALS CH
1.0000 | ORAL_TABLET | Freq: Every day | ORAL | Status: DC
  Administered 2019-05-22 – 2019-05-25 (×4): 1 via ORAL
  Filled 2019-05-22 (×4): qty 1

## 2019-05-22 MED ORDER — FOLIC ACID 1 MG PO TABS
1.0000 mg | ORAL_TABLET | Freq: Every day | ORAL | Status: DC
  Administered 2019-05-22 – 2019-05-25 (×4): 1 mg via ORAL
  Filled 2019-05-22 (×3): qty 1

## 2019-05-22 MED ORDER — HYDROCODONE-ACETAMINOPHEN 5-325 MG PO TABS
1.0000 | ORAL_TABLET | ORAL | Status: DC | PRN
  Administered 2019-05-22 – 2019-05-25 (×10): 1 via ORAL
  Filled 2019-05-22 (×10): qty 1

## 2019-05-22 MED ORDER — FERROUS SULFATE 325 (65 FE) MG PO TABS
325.0000 mg | ORAL_TABLET | Freq: Every day | ORAL | Status: DC
  Administered 2019-05-22 – 2019-05-25 (×4): 325 mg via ORAL
  Filled 2019-05-22 (×4): qty 1

## 2019-05-22 MED ORDER — CYCLOBENZAPRINE HCL 10 MG PO TABS: 5.0000 mg | ORAL_TABLET | Freq: Every evening | ORAL | Status: DC | PRN

## 2019-05-22 MED ORDER — FAMOTIDINE 20 MG PO TABS: 20.0000 mg | ORAL_TABLET | Freq: Every day | ORAL | Status: DC

## 2019-05-22 MED ORDER — SODIUM CHLORIDE 0.9 % IV SOLN
2.0000 g | INTRAVENOUS | Status: DC
  Filled 2019-05-22: qty 20

## 2019-05-22 MED ORDER — ACETAMINOPHEN 500 MG PO TABS
1000.0000 mg | ORAL_TABLET | Freq: Once | ORAL | Status: AC
  Administered 2019-05-22: 1000 mg via ORAL

## 2019-05-22 MED ORDER — ALBUTEROL SULFATE (2.5 MG/3ML) 0.083% IN NEBU: 2.5000 mg | INHALATION_SOLUTION | Freq: Four times a day (QID) | RESPIRATORY_TRACT | Status: DC | PRN

## 2019-05-22 MED ORDER — CYCLOBENZAPRINE HCL 10 MG PO TABS: 5.0000 mg | ORAL_TABLET | Freq: Every day | ORAL | Status: DC

## 2019-05-22 MED ORDER — BUSPIRONE HCL 10 MG PO TABS
10.0000 mg | ORAL_TABLET | Freq: Every day | ORAL | Status: DC
  Administered 2019-05-22 – 2019-05-25 (×4): 10 mg via ORAL
  Filled 2019-05-22: qty 1
  Filled 2019-05-22: qty 2
  Filled 2019-05-22 (×2): qty 1

## 2019-05-22 MED ORDER — SODIUM CHLORIDE 0.9 % IV SOLN: INTRAVENOUS | Status: DC

## 2019-05-22 NOTE — H&P (Signed)
[place] [facility] [place] NAME: Shelly Freeman    MR#:  [id number]  DATE OF BIRTH:  1951/04/27  DATE OF ADMISSION:  05/21/2019  PRIMARY CARE PHYSICIAN: Alene Mires Elyse Jarvis, MD   REQUESTING/REFERRING PHYSICIAN: Blake Divine, MD CHIEF COMPLAINT:   Chief Complaint  Patient presents with  . Fall    HISTORY OF PRESENT ILLNESS:  Shelly Freeman  is a 69 y.o. Caucasian female with a known history of hypertension, hypothyroidism, systemic lupus erythematosus and depression, who presented to the emergency room with an onset of generalized weakness and fall.  Patient apparently had rib fracture for which she was given tramadol.  She received 20 tablets on 30 January/2020 and just ran out.  She was found laying on the ground again yesterday by her family member.  She has been there apparently for 5 hours.  She was complaining of pain in her chest as well as her signed and along her entire back.  Pain has been increasing with movement and is much better at rest.  She was seen for her systemic lupus erythematosus on 1/12 by her rheumatologist and was prescribed Flexeril, Norco and Voltaren gel.  She stated that she had another fall backward yesterday and denied presyncope or syncope or lightheadedness or dizziness.  She admits to mild cough without wheezing or dyspnea or fever or chills.  She admits to urinary frequency and dysuria.  She graded her pain in the left side as 10/10 in severity.  No bleeding diathesis.  No nausea or vomiting or abdominal pain.  Upon presentation to the emergency room, blood pressure was 123/56 with a pulse of 105 and respiratory rate was 20 and later 23.  Labs revealed significant cytosis of 27.8 with neutrophilia and her CMP was remarkable for a BUN of 29 and creatinine 1.34 above her normal baseline.  CK was 260 with an sensitive troponin I of 6 and later 8.  Lactic acid was 1.  Influenza a and B antigens as well as COVID-19 antigen came back  negative.  Urinalysis was positive for UTI.  She had blood cultures drawn in the ER.  She had multiple CT scan including head and C-spine that showed mild left frontal scalp swelling without large hematoma or calvarial fracture, stable parenchymal volume loss and chronic microvascular ischemic white matter disease with no acute structural abnormalities and no acute cervical spine fracture or traumatic listhesis.  It did show minimal cervical spondylitic changes.  CT of the chest, abdomen and pelvis revealed no traumatic injury.  It showed mild right-sided hydronephrosis with no obstructing stone identified.  Findings could represent sequela of passed stone or possible infection.  It showed multiple remote healed right-sided rib fractures and no other acute abnormalities.  Labs revealed.  EKG showed sinus tachycardia with a rate of 101.  The patient was given 1 g of IV Rocephin, p.o. Tylenol, 1 L lactated Ringer and 1 L of normal saline and Lidoderm patch.  She will be admitted to a medical monitored bed for further evaluation and management. PAST MEDICAL HISTORY:   Past Medical History:  Diagnosis Date  . Arthritis   . Collagen vascular disease ([place])   . DDD (degenerative disc disease), lumbar 05/14/2016  . Depression   . Heart valve problem    per grandaughter need surgery but they told her may not survive;  [name] Hypertension   . Hypothyroidism   . Lupus ([place])   . Neuromuscular disorder ([place])   . Thyroid  disease   . Vitamin D deficiency 05/14/2016    PAST SURGICAL HISTORY:   Past Surgical History:  Procedure Laterality Date  . APPENDECTOMY    . BREAST BIOPSY Left    neg  . BREAST LUMPECTOMY Left   . CESAREAN SECTION    . COLONOSCOPY WITH PROPOFOL N/A 07/01/2017   Procedure: COLONOSCOPY WITH PROPOFOL;  Surgeon: Manya Silvas, MD;  Location: [facility] ENDOSCOPY;  Service: Endoscopy;  Laterality: N/A;  . ESOPHAGOGASTRODUODENOSCOPY (EGD) WITH PROPOFOL N/A 07/01/2017   Procedure:  ESOPHAGOGASTRODUODENOSCOPY (EGD) WITH PROPOFOL;  Surgeon: Manya Silvas, MD;  Location: [facility] ENDOSCOPY;  Service: Endoscopy;  Laterality: N/A;  . GASTRIC BYPASS    . TUBAL LIGATION      SOCIAL HISTORY:   Social History   Tobacco Use  . Smoking status: Never Smoker  . Smokeless tobacco: Never Used  Substance Use Topics  . Alcohol use: No    FAMILY HISTORY:   Family History  Problem Relation Age of Onset  . Breast cancer Maternal Aunt   . Breast cancer Cousin     DRUG ALLERGIES:  No Known Allergies  REVIEW OF SYSTEMS:   ROS As per history of present illness. All pertinent systems were reviewed above. Constitutional,  HEENT, cardiovascular, respiratory, GI, GU, musculoskeletal, neuro, psychiatric, endocrine,  integumentary and hematologic systems were reviewed and are otherwise  negative/unremarkable except for positive findings mentioned above in the HPI.   MEDICATIONS AT HOME:   Prior to Admission medications   Medication Sig Start Date End Date Taking? Authorizing Provider  acetaminophen (TYLENOL) 325 MG tablet Take 650 mg by mouth every 6 (six) hours as needed.   Yes [provider]  albuterol (PROVENTIL HFA;VENTOLIN HFA) 108 (90 Base) MCG/ACT inhaler Inhale 2 puffs into the lungs every 6 (six) hours as needed for wheezing or shortness of breath.   Yes [provider]  alendronate (FOSAMAX) 70 MG tablet Take 70 mg by mouth once a week. Take with a full glass of water on an empty stomach.   Yes [provider]  B Complex Vitamins (B COMPLEX PO) Take 1 tablet by mouth daily.    Yes [provider]  busPIRone (BUSPAR) 10 MG tablet Take 10 mg by mouth daily.   Yes [provider]  celecoxib (CELEBREX) 200 MG capsule Take 200 mg by mouth daily.    Yes [provider]  cyclobenzaprine (FLEXERIL) 5 MG tablet Take 5 mg by mouth at bedtime as needed. 05/19/19  Yes [provider]  diclofenac Sodium (VOLTAREN) 1 %  GEL Apply 2 g topically 4 (four) times daily.  05/19/19  Yes [provider]  docusate sodium (COLACE) 100 MG capsule Take 1 capsule (100 mg total) by mouth 2 (two) times daily. 01/30/19  Yes Henreitta Leber, MD  DULoxetine (CYMBALTA) 60 MG capsule Take 1 capsule (60 mg total) by mouth daily. Patient taking differently: Take 30 mg by mouth daily.  04/17/16  Yes Pucilowska, Jolanta B, MD  famotidine (PEPCID) 20 MG tablet Take 1 tablet (20 mg total) by mouth 2 (two) times daily. Patient taking differently: Take 20 mg by mouth daily.  04/16/16  Yes Pucilowska, Jolanta B, MD  ferrous sulfate 325 (65 FE) MG tablet Take 325 mg by mouth daily.   Yes [provider]  folic acid (FOLVITE) 1 MG tablet Take 1 mg by mouth daily.   Yes [provider]  gabapentin (NEURONTIN) 800 MG tablet Take 800 mg by mouth 2 (two)  times daily.   Yes [provider]  HYDROcodone-acetaminophen (NORCO/VICODIN) 5-325 MG tablet Take 1 tablet by mouth 2 (two) times daily as needed. 05/20/19  Yes [provider]  hydroxychloroquine (PLAQUENIL) 200 MG tablet Take 2 tablets (400 mg total) by mouth daily. Patient taking differently: Take 200 mg by mouth 2 (two) times daily.  04/04/16  Yes Theodoro Grist, MD  ibuprofen (ADVIL) 200 MG tablet Take 400-800 mg by mouth every 6 (six) hours as needed for fever, mild pain or moderate pain.   Yes [provider]  ipratropium-albuterol (DUONEB) 0.5-2.5 (3) MG/3ML SOLN Take 3 mLs by nebulization every 6 (six) hours as needed (Wheezing). 01/30/19  Yes Henreitta Leber, MD  isosorbide mononitrate (IMDUR) 30 MG 24 hr tablet Take 30 mg by mouth daily.   Yes [provider]  levothyroxine (SYNTHROID, LEVOTHROID) 75 MCG tablet Take 75 mcg by mouth daily before breakfast.   Yes [provider]  LORazepam (ATIVAN) 0.5 MG tablet Take 0.5 mg by mouth at bedtime.   Yes [provider]  metoprolol succinate (TOPROL-XL) 25 MG 24 hr  tablet Take 25 mg by mouth daily.    Yes [provider]  OLANZapine (ZYPREXA) 15 MG tablet Take 1 tablet (15 mg total) by mouth at bedtime. 04/16/16  Yes Pucilowska, Jolanta B, MD  pantoprazole (PROTONIX) 40 MG tablet Take 40 mg by mouth 2 (two) times daily.    Yes [provider]  traMADol (ULTRAM) 50 MG tablet Take 50 mg by mouth every 6 (six) hours as needed. 05/06/19  Yes [provider]  traZODone (DESYREL) 100 MG tablet Take 100 mg by mouth at bedtime.   Yes [provider]      VITAL SIGNS:  Blood pressure (!) 124/43, pulse 99, temperature 99 F (37.2 C), resp. rate (!) 21, weight 106.4 kg, SpO2 96 %.  PHYSICAL EXAMINATION:  Physical Exam  GENERAL:  69 y.o.-year-old Caucasian female patient lying in the bed with no acute distress.  EYES: Pupils equal, round, reactive to light and accommodation. No scleral icterus. Extraocular muscles intact.  HEENT: Head atraumatic, normocephalic. Oropharynx and nasopharynx clear.  NECK:  Supple, no jugular venous distention. No thyroid enlargement, no tenderness.  LUNGS: Normal breath sounds bilaterally, no wheezing, rales,rhonchi or crepitation. No use of accessory muscles of respiration.  CARDIOVASCULAR: Regular rate and rhythm, S1, S2 normal. No murmurs, rubs, or gallops.  She had left-sided chest wall tenderness at the site of her pain which was reproducible. ABDOMEN: Soft, nondistended with mild suprapubic tenderness without rebound tenderness guarding or rigidity.  Bowel sounds present. No organomegaly or mass.  EXTREMITIES: No pedal edema, cyanosis, or clubbing.  NEUROLOGIC: Cranial nerves II through XII are intact. Muscle strength 5/5 in all extremities. Sensation intact. Gait not checked.  PSYCHIATRIC: The patient is alert and oriented x 3.  Normal affect and good eye contact. SKIN: No obvious rash, lesion, or ulcer.   LABORATORY PANEL:   CBC Recent Labs  Lab 05/21/19 2133  WBC 27.8*  HGB 12.8   HCT 39.7  PLT 184   ------------------------------------------------------------------------------------------------------------------  Chemistries  Recent Labs  Lab 05/21/19 2133  NA 139  K 4.2  CL 108  CO2 22  GLUCOSE 135*  BUN 29*  CREATININE 1.34*  CALCIUM 8.4*  AST 17  ALT 11  ALKPHOS 82  BILITOT 1.0   ------------------------------------------------------------------------------------------------------------------  Cardiac Enzymes No results for input(s): TROPONINI in the last 168 hours. ------------------------------------------------------------------------------------------------------------------  RADIOLOGY:  CT ABDOMEN PELVIS WO  CONTRAST  Result Date: 05/21/2019 CLINICAL DATA:  Initial evaluation for acute rib pain status post fall. History of recent rib fractures. EXAM: CT CHEST, ABDOMEN AND PELVIS WITHOUT CONTRAST TECHNIQUE: Multidetector CT imaging of the chest, abdomen and pelvis was performed following the standard protocol without IV contrast. COMPARISON:  Comparison made with prior CT from 01/29/2019. FINDINGS: CT CHEST FINDINGS Cardiovascular: Limited noncontrast evaluation of the intrathoracic aorta is unremarkable without aneurysm or definite acute abnormality. Mild atherosclerotic change noted within the aortic arch. Partially visualized great vessels within normal limits. Heart size within normal limits. No pericardial effusion. Scattered coronary artery calcifications noted. Main pulmonary artery dilated up to 3.7 cm, suggesting possible underlying pulmonary hypertension. Mediastinum/Nodes: No pathologically enlarged mediastinal, hilar, or axillary lymph nodes. No mediastinal hematoma or mass. Esophagus demonstrates no acute finding. Small hiatal hernia noted. Lungs/Pleura: Tracheobronchial tree intact and patent. Lungs well inflated. Scattered linear atelectatic changes seen dependently within the lung bases bilaterally. No focal infiltrates or evidence for  pulmonary contusion. No edema or pleural effusion. No pneumothorax. Small granuloma again noted at the left upper lobe, unchanged. No other worrisome pulmonary nodule or mass. Musculoskeletal: External soft tissues demonstrate no acute abnormality. No acute osseous abnormality. Multiple remotely healed right-sided rib fractures (approximately ribs 4 through 9). CT ABDOMEN PELVIS FINDINGS Hepatobiliary: Liver demonstrates a normal unenhanced appearance. Gallbladder within normal limits. No biliary dilatation. Pancreas: Pancreas within normal limits. Spleen: Spleen intact and normal. Adrenals/Urinary Tract: Adrenal glands within normal limits. Left kidney unremarkable without nephrolithiasis or hydronephrosis. No left-sided hydroureter. On the right, there is mild hydronephrosis involving the right kidney with associated mild perinephric fat stranding. No obstructive radiopaque calculi seen along the course of the right renal collecting system. No stones seen within the right kidney itself. No significant right-sided hydroureter. Partially distended bladder within normal limits. No layering stones within the bladder lumen. 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 No evidence for bowel obstruction or acute bowel injury. No acute inflammatory changes seen about the bowels. Vascular/Lymphatic: Intra-abdominal aorta of normal caliber without obvious abnormality on this noncontrast examination. No adenopathy. Reproductive: Uterus and ovaries within normal limits. Other: No free air or fluid. No mesenteric or retroperitoneal hematoma. Musculoskeletal: External soft tissues demonstrate no acute finding. No acute fracture or other osseous abnormality about the pelvis. Few scattered benign bone islands noted. No other discrete or worrisome osseous lesions. CT THORACIC SPINE FINDINGS Alignment: Physiologic with preservation of the normal thoracic kyphosis. No listhesis or subluxation.  Vertebra: Vertebral body height maintained without evidence for acute or chronic fracture. No discrete or worrisome osseous lesions. Small benign bone island noted within the T3 vertebral body. Paraspinal soft tissues: Paraspinous soft tissues demonstrate no acute finding. Disc levels: Small degenerative endplate Schmorl's node noted at the superior endplate of T6. No significant disc pathology seen within the thoracic spine. No significant stenosis. CT LUMBAR SPINE FINDINGS Segmentation: Standard. Lowest well-formed disc space labeled the L5-S1 level. Alignment: Mild dextroscoliosis, apex at L3. Alignment otherwise normal preservation of the normal lumbar lordosis. No listhesis or subluxation. Vertebra: Vertebral body height maintained without evidence for acute or chronic fracture. Visualized sacrum and pelvis intact. SI joints approximated symmetric. No worrisome osseous lesions. Paraspinal soft tissues: Paraspinous soft tissues demonstrate no acute finding. Disc levels: L1-2:  Unremarkable. L2-3: Mild disc bulge, asymmetric to the left. Mild bilateral facet hypertrophy. No significant stenosis. L3-4: Mild disc bulge. Mild to moderate facet hypertrophy. No significant spinal stenosis. Mild bilateral foraminal narrowing. L4-5:  Disc bulge with disc desiccation. Moderate facet hypertrophy. Central canal remains patent. Mild to moderate bilateral L4 foraminal stenosis, worse on the right. L5-S1: Disc bulge with disc desiccation. Moderate to severe right with mild left facet hypertrophy. No significant spinal stenosis. Severe right with moderate left L5 foraminal narrowing. IMPRESSION: CT CHEST, ABDOMEN, AND PELVIS IMPRESSION: 1. No CT evidence for acute traumatic injury within the chest, abdomen, and pelvis. 2. Mild right-sided hydronephrosis. No obstructive stone identified. Finding of uncertain etiology, and could reflect sequelae of a recently passed stone or possibly infection. Correlation with urinalysis  recommended. 3. No other acute abnormality within the chest, abdomen, and pelvis. 4. Multiple remotely healed right-sided rib fractures. CT THORACIC SPINE IMPRESSION No acute traumatic injury within the thoracic spine. CT LUMBAR SPINE IMPRESSION 1. No acute traumatic injury within the lumbar spine. 2. Degenerative spondylosis at L4-5 and L5-S1 with resultant mild to moderate bilateral L4 foraminal narrowing, with more moderate to severe bilateral L5 foraminal stenosis. Electronically Signed   By: Jeannine Boga M.D.   On: 05/21/2019 23:00   CT Head Wo Contrast  Result Date: 05/21/2019 CLINICAL DATA:  [place] from bed EXAM: CT HEAD WITHOUT CONTRAST CT CERVICAL SPINE WITHOUT CONTRAST TECHNIQUE: Multidetector CT imaging of the head and cervical spine was performed following the standard protocol without intravenous contrast. Multiplanar CT image reconstructions of the cervical spine were also generated. COMPARISON:  CT head 04/15/2017 FINDINGS: CT HEAD FINDINGS Brain: No evidence of acute infarction, hemorrhage, hydrocephalus, extra-axial collection or mass lesion/mass effect. Symmetric prominence of the ventricles, cisterns and sulci compatible with parenchymal volume loss. Mixed patchy and confluent areas of white matter hypoattenuation are most compatible with moderate chronic microvascular angiopathy, overall extent of which is similar to comparison. Vascular: Atherosclerotic calcification of the carotid siphons and intradural vertebral arteries. No hyperdense vessel. Skull: There is some mild left frontal scalp swelling without large hematoma or subjacent calvarial fracture. Benign hyperostosis frontalis interna is incidentally noted. Sinuses/Orbits: Paranasal sinuses and mastoid air cells are predominantly clear. Included orbital structures are unremarkable. Other: None CT CERVICAL SPINE FINDINGS Alignment: Preservation of the normal cervical lordosis without traumatic listhesis. No abnormal facet  widening. Normal alignment of the craniocervical and atlantoaxial articulations. Skull base and vertebrae: No acute fracture. No primary bone lesion or focal pathologic process. Soft tissues and spinal canal: No pre or paravertebral fluid or swelling. No visible canal hematoma. Disc levels: Minimal diffuse intervertebral disc height loss with early spondylitic endplate changes, maximal C6-7. no significant neural foraminal stenosis or spinal canal narrowing. Upper chest: No acute abnormality in the upper chest or imaged lung apices. Dedicated CT of the chest was performed concurrently. Other: Atherosclerotic calcification of the cervical carotid arteries. IMPRESSION: 1. Mild left frontal scalp swelling without large hematoma or subjacent calvarial fracture. 2. No acute intracranial abnormality. 3. Stable parenchymal volume loss and chronic microvascular ischemic white matter disease. 4. No acute cervical spine fracture or traumatic listhesis. 5. Minimal cervical spondylitic changes. Electronically Signed   By: Lovena Le M.D.   On: 05/21/2019 21:52   CT Chest Wo Contrast  Result Date: 05/21/2019 CLINICAL DATA:  Initial evaluation for acute rib pain status post fall. History of recent rib fractures. EXAM: CT CHEST, ABDOMEN AND PELVIS WITHOUT CONTRAST TECHNIQUE: Multidetector CT imaging of the chest, abdomen and pelvis was performed following the standard protocol without IV contrast. COMPARISON:  Comparison made with prior CT from 01/29/2019. FINDINGS: CT CHEST FINDINGS Cardiovascular: Limited noncontrast evaluation of the intrathoracic aorta is unremarkable  without aneurysm or definite acute abnormality. Mild atherosclerotic change noted within the aortic arch. Partially visualized great vessels within normal limits. Heart size within normal limits. No pericardial effusion. Scattered coronary artery calcifications noted. Main pulmonary artery dilated up to 3.7 cm, suggesting possible underlying pulmonary  hypertension. Mediastinum/Nodes: No pathologically enlarged mediastinal, hilar, or axillary lymph nodes. No mediastinal hematoma or mass. Esophagus demonstrates no acute finding. Small hiatal hernia noted. Lungs/Pleura: Tracheobronchial tree intact and patent. Lungs well inflated. Scattered linear atelectatic changes seen dependently within the lung bases bilaterally. No focal infiltrates or evidence for pulmonary contusion. No edema or pleural effusion. No pneumothorax. Small granuloma again noted at the left upper lobe, unchanged. No other worrisome pulmonary nodule or mass. Musculoskeletal: External soft tissues demonstrate no acute abnormality. No acute osseous abnormality. Multiple remotely healed right-sided rib fractures (approximately ribs 4 through 9). CT ABDOMEN PELVIS FINDINGS Hepatobiliary: Liver demonstrates a normal unenhanced appearance. Gallbladder within normal limits. No biliary dilatation. Pancreas: Pancreas within normal limits. Spleen: Spleen intact and normal. Adrenals/Urinary Tract: Adrenal glands within normal limits. Left kidney unremarkable without nephrolithiasis or hydronephrosis. No left-sided hydroureter. On the right, there is mild hydronephrosis involving the right kidney with associated mild perinephric fat stranding. No obstructive radiopaque calculi seen along the course of the right renal collecting system. No stones seen within the right kidney itself. No significant right-sided hydroureter. Partially distended bladder within normal limits. No layering stones within the bladder lumen. 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 No evidence for bowel obstruction or acute bowel injury. No acute inflammatory changes seen about the bowels. Vascular/Lymphatic: Intra-abdominal aorta of normal caliber without obvious abnormality on this noncontrast examination. No adenopathy. Reproductive: Uterus and ovaries within normal limits. Other: No free  air or fluid. No mesenteric or retroperitoneal hematoma. Musculoskeletal: External soft tissues demonstrate no acute finding. No acute fracture or other osseous abnormality about the pelvis. Few scattered benign bone islands noted. No other discrete or worrisome osseous lesions. CT THORACIC SPINE FINDINGS Alignment: Physiologic with preservation of the normal thoracic kyphosis. No listhesis or subluxation. Vertebra: Vertebral body height maintained without evidence for acute or chronic fracture. No discrete or worrisome osseous lesions. Small benign bone island noted within the T3 vertebral body. Paraspinal soft tissues: Paraspinous soft tissues demonstrate no acute finding. Disc levels: Small degenerative endplate Schmorl's node noted at the superior endplate of T6. No significant disc pathology seen within the thoracic spine. No significant stenosis. CT LUMBAR SPINE FINDINGS Segmentation: Standard. Lowest well-formed disc space labeled the L5-S1 level. Alignment: Mild dextroscoliosis, apex at L3. Alignment otherwise normal preservation of the normal lumbar lordosis. No listhesis or subluxation. Vertebra: Vertebral body height maintained without evidence for acute or chronic fracture. Visualized sacrum and pelvis intact. SI joints approximated symmetric. No worrisome osseous lesions. Paraspinal soft tissues: Paraspinous soft tissues demonstrate no acute finding. Disc levels: L1-2:  Unremarkable. L2-3: Mild disc bulge, asymmetric to the left. Mild bilateral facet hypertrophy. No significant stenosis. L3-4: Mild disc bulge. Mild to moderate facet hypertrophy. No significant spinal stenosis. Mild bilateral foraminal narrowing. L4-5: Disc bulge with disc desiccation. Moderate facet hypertrophy. Central canal remains patent. Mild to moderate bilateral L4 foraminal stenosis, worse on the right. L5-S1: Disc bulge with disc desiccation. Moderate to severe right with mild left facet hypertrophy. No significant spinal  stenosis. Severe right with moderate left L5 foraminal narrowing. IMPRESSION: CT CHEST, ABDOMEN, AND PELVIS IMPRESSION: 1. No CT evidence for acute traumatic injury within the chest, abdomen, and pelvis.  2. Mild right-sided hydronephrosis. No obstructive stone identified. Finding of uncertain etiology, and could reflect sequelae of a recently passed stone or possibly infection. Correlation with urinalysis recommended. 3. No other acute abnormality within the chest, abdomen, and pelvis. 4. Multiple remotely healed right-sided rib fractures. CT THORACIC SPINE IMPRESSION No acute traumatic injury within the thoracic spine. CT LUMBAR SPINE IMPRESSION 1. No acute traumatic injury within the lumbar spine. 2. Degenerative spondylosis at L4-5 and L5-S1 with resultant mild to moderate bilateral L4 foraminal narrowing, with more moderate to severe bilateral L5 foraminal stenosis. Electronically Signed   By: Jeannine Boga M.D.   On: 05/21/2019 23:00   CT Cervical Spine Wo Contrast  Result Date: 05/21/2019 CLINICAL DATA:  [place] from bed EXAM: CT HEAD WITHOUT CONTRAST CT CERVICAL SPINE WITHOUT CONTRAST TECHNIQUE: Multidetector CT imaging of the head and cervical spine was performed following the standard protocol without intravenous contrast. Multiplanar CT image reconstructions of the cervical spine were also generated. COMPARISON:  CT head 04/15/2017 FINDINGS: CT HEAD FINDINGS Brain: No evidence of acute infarction, hemorrhage, hydrocephalus, extra-axial collection or mass lesion/mass effect. Symmetric prominence of the ventricles, cisterns and sulci compatible with parenchymal volume loss. Mixed patchy and confluent areas of white matter hypoattenuation are most compatible with moderate chronic microvascular angiopathy, overall extent of which is similar to comparison. Vascular: Atherosclerotic calcification of the carotid siphons and intradural vertebral arteries. No hyperdense vessel. Skull: There is some mild  left frontal scalp swelling without large hematoma or subjacent calvarial fracture. Benign hyperostosis frontalis interna is incidentally noted. Sinuses/Orbits: Paranasal sinuses and mastoid air cells are predominantly clear. Included orbital structures are unremarkable. Other: None CT CERVICAL SPINE FINDINGS Alignment: Preservation of the normal cervical lordosis without traumatic listhesis. No abnormal facet widening. Normal alignment of the craniocervical and atlantoaxial articulations. Skull base and vertebrae: No acute fracture. No primary bone lesion or focal pathologic process. Soft tissues and spinal canal: No pre or paravertebral fluid or swelling. No visible canal hematoma. Disc levels: Minimal diffuse intervertebral disc height loss with early spondylitic endplate changes, maximal C6-7. no significant neural foraminal stenosis or spinal canal narrowing. Upper chest: No acute abnormality in the upper chest or imaged lung apices. Dedicated CT of the chest was performed concurrently. Other: Atherosclerotic calcification of the cervical carotid arteries. IMPRESSION: 1. Mild left frontal scalp swelling without large hematoma or subjacent calvarial fracture. 2. No acute intracranial abnormality. 3. Stable parenchymal volume loss and chronic microvascular ischemic white matter disease. 4. No acute cervical spine fracture or traumatic listhesis. 5. Minimal cervical spondylitic changes. Electronically Signed   By: Lovena Le M.D.   On: 05/21/2019 21:52   CT T-SPINE NO CHARGE  Result Date: 05/21/2019 CLINICAL DATA:  Initial evaluation for acute rib pain status post fall. History of recent rib fractures. EXAM: CT CHEST, ABDOMEN AND PELVIS WITHOUT CONTRAST TECHNIQUE: Multidetector CT imaging of the chest, abdomen and pelvis was performed following the standard protocol without IV contrast. COMPARISON:  Comparison made with prior CT from 01/29/2019. FINDINGS: CT CHEST FINDINGS Cardiovascular: Limited noncontrast  evaluation of the intrathoracic aorta is unremarkable without aneurysm or definite acute abnormality. Mild atherosclerotic change noted within the aortic arch. Partially visualized great vessels within normal limits. Heart size within normal limits. No pericardial effusion. Scattered coronary artery calcifications noted. Main pulmonary artery dilated up to 3.7 cm, suggesting possible underlying pulmonary hypertension. Mediastinum/Nodes: No pathologically enlarged mediastinal, hilar, or axillary lymph nodes. No mediastinal hematoma or mass. Esophagus demonstrates no acute finding. Small hiatal hernia  noted. Lungs/Pleura: Tracheobronchial tree intact and patent. Lungs well inflated. Scattered linear atelectatic changes seen dependently within the lung bases bilaterally. No focal infiltrates or evidence for pulmonary contusion. No edema or pleural effusion. No pneumothorax. Small granuloma again noted at the left upper lobe, unchanged. No other worrisome pulmonary nodule or mass. Musculoskeletal: External soft tissues demonstrate no acute abnormality. No acute osseous abnormality. Multiple remotely healed right-sided rib fractures (approximately ribs 4 through 9). CT ABDOMEN PELVIS FINDINGS Hepatobiliary: Liver demonstrates a normal unenhanced appearance. Gallbladder within normal limits. No biliary dilatation. Pancreas: Pancreas within normal limits. Spleen: Spleen intact and normal. Adrenals/Urinary Tract: Adrenal glands within normal limits. Left kidney unremarkable without nephrolithiasis or hydronephrosis. No left-sided hydroureter. On the right, there is mild hydronephrosis involving the right kidney with associated mild perinephric fat stranding. No obstructive radiopaque calculi seen along the course of the right renal collecting system. No stones seen within the right kidney itself. No significant right-sided hydroureter. Partially distended bladder within normal limits. No layering stones within the bladder  lumen. 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 No evidence for bowel obstruction or acute bowel injury. No acute inflammatory changes seen about the bowels. Vascular/Lymphatic: Intra-abdominal aorta of normal caliber without obvious abnormality on this noncontrast examination. No adenopathy. Reproductive: Uterus and ovaries within normal limits. Other: No free air or fluid. No mesenteric or retroperitoneal hematoma. Musculoskeletal: External soft tissues demonstrate no acute finding. No acute fracture or other osseous abnormality about the pelvis. Few scattered benign bone islands noted. No other discrete or worrisome osseous lesions. CT THORACIC SPINE FINDINGS Alignment: Physiologic with preservation of the normal thoracic kyphosis. No listhesis or subluxation. Vertebra: Vertebral body height maintained without evidence for acute or chronic fracture. No discrete or worrisome osseous lesions. Small benign bone island noted within the T3 vertebral body. Paraspinal soft tissues: Paraspinous soft tissues demonstrate no acute finding. Disc levels: Small degenerative endplate Schmorl's node noted at the superior endplate of T6. No significant disc pathology seen within the thoracic spine. No significant stenosis. CT LUMBAR SPINE FINDINGS Segmentation: Standard. Lowest well-formed disc space labeled the L5-S1 level. Alignment: Mild dextroscoliosis, apex at L3. Alignment otherwise normal preservation of the normal lumbar lordosis. No listhesis or subluxation. Vertebra: Vertebral body height maintained without evidence for acute or chronic fracture. Visualized sacrum and pelvis intact. SI joints approximated symmetric. No worrisome osseous lesions. Paraspinal soft tissues: Paraspinous soft tissues demonstrate no acute finding. Disc levels: L1-2:  Unremarkable. L2-3: Mild disc bulge, asymmetric to the left. Mild bilateral facet hypertrophy. No significant stenosis. L3-4: Mild disc  bulge. Mild to moderate facet hypertrophy. No significant spinal stenosis. Mild bilateral foraminal narrowing. L4-5: Disc bulge with disc desiccation. Moderate facet hypertrophy. Central canal remains patent. Mild to moderate bilateral L4 foraminal stenosis, worse on the right. L5-S1: Disc bulge with disc desiccation. Moderate to severe right with mild left facet hypertrophy. No significant spinal stenosis. Severe right with moderate left L5 foraminal narrowing. IMPRESSION: CT CHEST, ABDOMEN, AND PELVIS IMPRESSION: 1. No CT evidence for acute traumatic injury within the chest, abdomen, and pelvis. 2. Mild right-sided hydronephrosis. No obstructive stone identified. Finding of uncertain etiology, and could reflect sequelae of a recently passed stone or possibly infection. Correlation with urinalysis recommended. 3. No other acute abnormality within the chest, abdomen, and pelvis. 4. Multiple remotely healed right-sided rib fractures. CT THORACIC SPINE IMPRESSION No acute traumatic injury within the thoracic spine. CT LUMBAR SPINE IMPRESSION 1. No acute traumatic injury within the lumbar spine.  2. Degenerative spondylosis at L4-5 and L5-S1 with resultant mild to moderate bilateral L4 foraminal narrowing, with more moderate to severe bilateral L5 foraminal stenosis. Electronically Signed   By: Jeannine Boga M.D.   On: 05/21/2019 23:00   CT L-SPINE NO CHARGE  Result Date: 05/21/2019 CLINICAL DATA:  Initial evaluation for acute rib pain status post fall. History of recent rib fractures. EXAM: CT CHEST, ABDOMEN AND PELVIS WITHOUT CONTRAST TECHNIQUE: Multidetector CT imaging of the chest, abdomen and pelvis was performed following the standard protocol without IV contrast. COMPARISON:  Comparison made with prior CT from 01/29/2019. FINDINGS: CT CHEST FINDINGS Cardiovascular: Limited noncontrast evaluation of the intrathoracic aorta is unremarkable without aneurysm or definite acute abnormality. Mild  atherosclerotic change noted within the aortic arch. Partially visualized great vessels within normal limits. Heart size within normal limits. No pericardial effusion. Scattered coronary artery calcifications noted. Main pulmonary artery dilated up to 3.7 cm, suggesting possible underlying pulmonary hypertension. Mediastinum/Nodes: No pathologically enlarged mediastinal, hilar, or axillary lymph nodes. No mediastinal hematoma or mass. Esophagus demonstrates no acute finding. Small hiatal hernia noted. Lungs/Pleura: Tracheobronchial tree intact and patent. Lungs well inflated. Scattered linear atelectatic changes seen dependently within the lung bases bilaterally. No focal infiltrates or evidence for pulmonary contusion. No edema or pleural effusion. No pneumothorax. Small granuloma again noted at the left upper lobe, unchanged. No other worrisome pulmonary nodule or mass. Musculoskeletal: External soft tissues demonstrate no acute abnormality. No acute osseous abnormality. Multiple remotely healed right-sided rib fractures (approximately ribs 4 through 9). CT ABDOMEN PELVIS FINDINGS Hepatobiliary: Liver demonstrates a normal unenhanced appearance. Gallbladder within normal limits. No biliary dilatation. Pancreas: Pancreas within normal limits. Spleen: Spleen intact and normal. Adrenals/Urinary Tract: Adrenal glands within normal limits. Left kidney unremarkable without nephrolithiasis or hydronephrosis. No left-sided hydroureter. On the right, there is mild hydronephrosis involving the right kidney with associated mild perinephric fat stranding. No obstructive radiopaque calculi seen along the course of the right renal collecting system. No stones seen within the right kidney itself. No significant right-sided hydroureter. Partially distended bladder within normal limits. No layering stones within the bladder lumen. 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 No evidence for  bowel obstruction or acute bowel injury. No acute inflammatory changes seen about the bowels. Vascular/Lymphatic: Intra-abdominal aorta of normal caliber without obvious abnormality on this noncontrast examination. No adenopathy. Reproductive: Uterus and ovaries within normal limits. Other: No free air or fluid. No mesenteric or retroperitoneal hematoma. Musculoskeletal: External soft tissues demonstrate no acute finding. No acute fracture or other osseous abnormality about the pelvis. Few scattered benign bone islands noted. No other discrete or worrisome osseous lesions. CT THORACIC SPINE FINDINGS Alignment: Physiologic with preservation of the normal thoracic kyphosis. No listhesis or subluxation. Vertebra: Vertebral body height maintained without evidence for acute or chronic fracture. No discrete or worrisome osseous lesions. Small benign bone island noted within the T3 vertebral body. Paraspinal soft tissues: Paraspinous soft tissues demonstrate no acute finding. Disc levels: Small degenerative endplate Schmorl's node noted at the superior endplate of T6. No significant disc pathology seen within the thoracic spine. No significant stenosis. CT LUMBAR SPINE FINDINGS Segmentation: Standard. Lowest well-formed disc space labeled the L5-S1 level. Alignment: Mild dextroscoliosis, apex at L3. Alignment otherwise normal preservation of the normal lumbar lordosis. No listhesis or subluxation. Vertebra: Vertebral body height maintained without evidence for acute or chronic fracture. Visualized sacrum and pelvis intact. SI joints approximated symmetric. No worrisome osseous lesions. Paraspinal soft tissues: Paraspinous  soft tissues demonstrate no acute finding. Disc levels: L1-2:  Unremarkable. L2-3: Mild disc bulge, asymmetric to the left. Mild bilateral facet hypertrophy. No significant stenosis. L3-4: Mild disc bulge. Mild to moderate facet hypertrophy. No significant spinal stenosis. Mild bilateral foraminal  narrowing. L4-5: Disc bulge with disc desiccation. Moderate facet hypertrophy. Central canal remains patent. Mild to moderate bilateral L4 foraminal stenosis, worse on the right. L5-S1: Disc bulge with disc desiccation. Moderate to severe right with mild left facet hypertrophy. No significant spinal stenosis. Severe right with moderate left L5 foraminal narrowing. IMPRESSION: CT CHEST, ABDOMEN, AND PELVIS IMPRESSION: 1. No CT evidence for acute traumatic injury within the chest, abdomen, and pelvis. 2. Mild right-sided hydronephrosis. No obstructive stone identified. Finding of uncertain etiology, and could reflect sequelae of a recently passed stone or possibly infection. Correlation with urinalysis recommended. 3. No other acute abnormality within the chest, abdomen, and pelvis. 4. Multiple remotely healed right-sided rib fractures. CT THORACIC SPINE IMPRESSION No acute traumatic injury within the thoracic spine. CT LUMBAR SPINE IMPRESSION 1. No acute traumatic injury within the lumbar spine. 2. Degenerative spondylosis at L4-5 and L5-S1 with resultant mild to moderate bilateral L4 foraminal narrowing, with more moderate to severe bilateral L5 foraminal stenosis. Electronically Signed   By: Jeannine Boga M.D.   On: 05/21/2019 23:00      IMPRESSION AND PLAN:   1.  Sepsis likely secondary to UTI.  She had a fall with subsequent left-sided chest pain that is reproducible.  Her generalized weakness could be related to her sepsis.  She could be having mild acute pyonephritis as well.  This manifested by leukocytosis, tachycardia and tachypnea.  Patient will be admitted to a medical monitored bed but should be placed on continued antibiotic therapy with IV Rocephin.  Will hydrate with IV normal saline.  Pain management will be provided.  We will follow urine and blood cultures.  She has acute kidney injury likely prerenal from volume depletion and dehydration.  2.  Hypothyroidism.  We will continue  Synthroid and check TSH level.  3.  Coronary artery disease.  We will continue beta-blocker therapy with Toprol-XL as well as Imdur.  4.  Systemic lupus erythematosus.  We will continue her Plaquenil.  5.  Depression and anxiety.  We will continue Cymbalta, BuSpar and Ativan.  6.  GERD.  We will continue PPI therapy.  7.  Osteoarthritis and osteoporosis.  Continue Fosamax and Celebrex.  8.  DVT prophylaxis.  Subcutaneous Lovenox.  All the records are reviewed and case discussed with ED provider. The plan of care was discussed in details with the patient (and family). I answered all questions. The patient agreed to proceed with the above mentioned plan. Further management will depend upon hospital course.   CODE STATUS: Full code  TOTAL TIME TAKING CARE OF THIS PATIENT: 55 minutes.    Christel Mormon M.D on 05/22/2019 at 5:07 AM  [facility]   From 7 PM-7 AM, contact night-coverage [email or web address]  CC: Primary care physician; Revelo, Elyse Jarvis, MD   Note: This dictation was prepared with Dragon dictation along with smaller phrase technology. Any transcriptional errors that result from this process are unintentional.

## 2019-05-22 NOTE — ED Notes (Addendum)
Family updated after pt gave verbal consent for RN to talk to them. Family now talking to patient on the phone.

## 2019-05-22 NOTE — Consult Note (Signed)
Pharmacy Antibiotic Note  Shelly Freeman is a 69 y.o. female admitted on 05/21/2019 with bacteremia/pyelonephritis.  Pharmacy has been consulted for Meropenem dosing.  Plan: Will start meropenem 1g q 8h  Weight: 234 lb 9.1 oz (106.4 kg)  Temp (24hrs), Avg:98.6 F (37 C), Min:98.1 F (36.7 C), Max:99 F (37.2 C)  Recent Labs  Lab 05/21/19 2133 05/21/19 2330 05/22/19 0548  WBC 27.8*  --  17.1*  CREATININE 1.34*  --  0.90  LATICACIDVEN  --  1.0  --     Estimated Creatinine Clearance: 69.1 mL/min (by C-G formula based on SCr of 0.9 mg/dL).    No Known Allergies  Antimicrobials this admission: 1/14 Ceftriaxone 2g x 1 1/15 Meropenem >>   Dose adjustments this admission: None  Microbiology results: 1/14 BCx: E Coli 2/4 bottles (so far) 1/14 UCx: pending   COVID/FLU NEG  Thank you for allowing pharmacy to be a part of this patient's care.  Lu Duffel, PharmD, BCPS Clinical Pharmacist 05/22/2019 11:49 AM

## 2019-05-22 NOTE — Progress Notes (Signed)
No charge note  Patient seen and examined this morning, admitted overnight, H&P reviewed and agree with the assessment and plan  This is a 69 year old Spanish-speaking female with HTN, hypothyroidism, SLE, depression who presents to the hospital with onset of generalized weakness resulting in a fall.  Patient apparently had a rib fracture for which she was given tramadol.  She was found laying on the ground by family member, supposedly unable to get up for about 5 hours and was brought to the hospital.  She has been complaining of urinary frequency and dysuria as well as bilateral lower abdominal and bilateral flank pain.  In the ED she was found to have significant leukocytosis with a white count of 20 7K, and a urinalysis positive for UTI.  She was placed on ceftriaxone and she was admitted to the hospital.  Sepsis likely secondary to UTI/pyelonephritis -Potentially cause of generalized weakness, she did have abdominal tenderness as well flank tenderness suggesting underlying pyelonephritis -She was started on 2 g of ceftriaxone, continue, monitor cultures  Acute kidney injury -Likely prerenal from sepsis, monitor with IV fluids  Hypothyroidism -Continue Synthroid  CAD -Continue beta-blocker, Imdur -Troponin negative  SLE -Continue Plaquenil  Depression/anxiety -Continue Cymbalta, BuSpar, Ativan  GERD -Continue PPI  Osteoarthritis osteoporosis -Continue Fosamax, Celebrex  Generalized pain -Continue tramadol  Scheduled Meds: . busPIRone  10 mg Oral Daily  . celecoxib  200 mg Oral Daily  . cyclobenzaprine  5 mg Oral QHS  . DULoxetine  30 mg Oral Daily  . enoxaparin (LOVENOX) injection  40 mg Subcutaneous BID  . ferrous sulfate  325 mg Oral Daily  . folic acid  1 mg Oral Daily  . gabapentin  400 mg Oral BID  . hydroxychloroquine  200 mg Oral BID  . isosorbide mononitrate  30 mg Oral Daily  . levothyroxine  75 mcg Oral QAC breakfast  . lidocaine  1 patch Transdermal Q24H   . LORazepam  0.5 mg Oral QHS  . multivitamin with minerals  1 tablet Oral Daily  . OLANZapine  15 mg Oral QHS  . pantoprazole  40 mg Oral BID  . polyethylene glycol  17 g Oral Daily  . senna-docusate  2 tablet Oral BID  . traZODone  100 mg Oral QHS   Continuous Infusions: . sodium chloride 75 mL/hr at 05/22/19 0348  . cefTRIAXone (ROCEPHIN)  IV     PRN Meds:.acetaminophen **OR** acetaminophen, albuterol, HYDROcodone-acetaminophen, ondansetron **OR** ondansetron (ZOFRAN) IV, traMADol  Ilijah Doucet M. Cruzita Lederer, MD, PhD [facility]  Between 7 am - 7 pm I am available, please contact me via Amion or Securechat Between 7 pm - 7 am I am not available, please contact night coverage MD/APP via Amion

## 2019-05-22 NOTE — TOC Progression Note (Signed)
Transition of Care [facility]) - Progression Note    Patient Details  Name: Deshundra Waller MRN: [id number] Date of Birth: [birth year]  Transition of [facility]) CM/SW Contact  Patrina Andreas, Gardiner Rhyme, LCSW Phone Number: 05/22/2019, 3:37 PM  Clinical Narrative:  Met with pt to see what pref for home health agency she would like her only need is HHPT. She has rolling walker and acne at home already and used when needed. She lives alone but her granddaughter is involved and will come by more to check on her and make sure she is ok. Have contacted [facility] whom pt pref since had in 01/2019.  Contacted [facility] who is checking on and declined. Also reached out to [facility] who can not take her. [facility] will check to see if can take.          Expected Discharge Plan and [place] with home health PT only need                                             Social Determinants of Health (SDOH) Interventions    Readmission Risk Interventions Readmission Risk Prevention Plan 01/30/2019  Post Dischage Appt Complete  Medication Screening Complete  Transportation Screening Complete  Some recent data might be hidden

## 2019-05-22 NOTE — ED Notes (Signed)
Pt assisted to bedside commode

## 2019-05-22 NOTE — TOC Transition Note (Signed)
Transition of Care [facility]) - CM/SW Discharge Note   Patient Details  Name: Shelly Freeman MRN: [id number] Date of Birth: [birth year]  [facility] CM/SW Contact:  Elease Hashimoto, LCSW Phone Number: 05/22/2019, 4:17 PM   Clinical Narrative:   Met with pt and spoke with granddaughter via telephone to discuss home health needs. Pt pref [facility] but they are unable to take the referral. Has all equipment and transportation. Granddaughter and other family members will be checking on her more often to make sure doing well at home. Set up via [facility] via Corene Cornea who can start early next week.    Final next level of care: [place] Barriers to Discharge: Continued Medical Work up   Patient Goals and CMS Choice Patient states their goals for this hospitalization and ongoing recovery are:: Wants to feel better and pain better.      Discharge Placement  Home with [facility] to provide HHPT services. Family to check in with her more often and make sure doing ok. Pt reached mod/i with her rolling walker.                     Discharge Plan and Services In-house Referral: Clinical Social Work   Post Acute Care Choice: Home Health                    HH Arranged: PT [facility] Agency: [place] [facility]) Date [place]: 05/22/19 Time [place]: [place] Representative spoke with at [place]: [place] ([place]) Interventions     Readmission Risk Interventions Readmission Risk Prevention Plan 01/30/2019  Post Dischage Appt Complete  Medication Screening Complete  Transportation Screening Complete  Some recent data might be hidden

## 2019-05-22 NOTE — Progress Notes (Signed)
PHARMACY - PHYSICIAN COMMUNICATION CRITICAL VALUE ALERT - BLOOD CULTURE IDENTIFICATION (BCID)  Shelly Freeman is an 69 y.o. female who presented to [facility] on 05/21/2019 with a chief complaint of weaknes, fall, and flank pain/pyelonephritis  Assessment:  E Coli bacteremia (2/4 bottles currently) - suspected source UTI   Name of physician (or Provider) Contacted: Renne Crigler  Current antibiotics: Ceftriaxone  Changes to prescribed antibiotics recommended: Meropenem  Recommendation accepted - consult entered  Results for orders placed or performed during the hospital encounter of 05/21/19  Blood Culture ID Panel (Reflexed) (Collected: 05/21/2019 11:30 PM)  Result Value Ref Range   Enterococcus species NOT DETECTED NOT DETECTED   Listeria monocytogenes NOT DETECTED NOT DETECTED   Staphylococcus species NOT DETECTED NOT DETECTED   Staphylococcus aureus (BCID) NOT DETECTED NOT DETECTED   Streptococcus species NOT DETECTED NOT DETECTED   Streptococcus agalactiae NOT DETECTED NOT DETECTED   Streptococcus pneumoniae NOT DETECTED NOT DETECTED   Streptococcus pyogenes NOT DETECTED NOT DETECTED   Acinetobacter baumannii NOT DETECTED NOT DETECTED   Enterobacteriaceae species DETECTED (A) NOT DETECTED   Enterobacter cloacae complex NOT DETECTED NOT DETECTED   Escherichia coli DETECTED (A) NOT DETECTED   Klebsiella oxytoca NOT DETECTED NOT DETECTED   Klebsiella pneumoniae NOT DETECTED NOT DETECTED   Proteus species NOT DETECTED NOT DETECTED   Serratia marcescens NOT DETECTED NOT DETECTED   Carbapenem resistance NOT DETECTED NOT DETECTED   Haemophilus influenzae NOT DETECTED NOT DETECTED   Neisseria meningitidis NOT DETECTED NOT DETECTED   Pseudomonas aeruginosa NOT DETECTED NOT DETECTED   Candida albicans NOT DETECTED NOT DETECTED   Candida glabrata NOT DETECTED NOT DETECTED   Candida krusei NOT DETECTED NOT DETECTED   Candida parapsilosis NOT DETECTED NOT DETECTED   Candida tropicalis NOT  DETECTED NOT [place], PharmD, BCPS Clinical Pharmacist 05/22/2019 11:45 AM

## 2019-05-22 NOTE — Evaluation (Signed)
Physical Therapy Evaluation Patient Details Name: Shelly Freeman MRN: [id number] DOB: [birth year] Today's Date: 05/22/2019   History of Present Illness  69 year old Spanish-speaking female with HTN, hypothyroidism, SLE, depression who presents to the hospital with onset of generalized weakness resulting in a fall.  She was here a few weeks ago after a fall suffering rib fxs.  Clinical Impression  Pt cleared for PT by nursing, HR remains elevated t/o the session though does not increase significantly with activity and she was ultimately able to do ~200 ft of ambulation/gait training with walker (on room air, sats generally in low 90s).  Pt reports that she typically uses a cane in the home, has not had issues with falling until last month, she also reports that granddaughter has been around 3 hr in the am, 3hrs in the pm and occasional mid day check-ins though she is home alone each night.  Pt overall did well and despite pain in chest and associated difficulty with bed mobility she is safe to return home from PT perspective - did encourage as much assist as possible from granddaughter initially as well as HHPT until she is back to her baseline.     Follow Up Recommendations Home health PT;Supervision - Intermittent    Equipment Recommendations  None recommended by PT    Recommendations for Other Services       Precautions / Restrictions Precautions Precautions: Fall Restrictions Weight Bearing Restrictions: No      Mobility  Bed Mobility Overal bed mobility: Needs Assistance Bed Mobility: Supine to Sit;Sit to Supine     Supine to sit: Min assist Sit to supine: Min assist   General bed mobility comments: Pt clearly with some rib/sternal pain during the transitions, showed good effort and really only needed assist with managing torso (to and from sitting)  Transfers Overall transfer level: Needs assistance Equipment used: Rolling walker (2 wheeled) Transfers: Sit to/from  Stand Sit to Stand: Min guard         General transfer comment: Pt was able to rise to standing w/o direct assist, showed good weight shift and use of walker  Ambulation/Gait Ambulation/Gait assistance: Min guard Gait Distance (Feet): 200 Feet Assistive device: Rolling walker (2 wheeled)       General Gait Details: Pt with slow but consistent cadence, no LOB and no excessive fatigue.  O2 generally low 90s on room air, HR 100-115 t/o the effort with no reports of excessive fatigue, SOB, etc  Stairs            Wheelchair Mobility    Modified Rankin (Stroke Patients Only)       Balance Overall balance assessment: Mild deficits observed, not formally tested                                           Pertinent Vitals/Pain Pain Assessment: 0-10 Pain Score: 8  Pain Location: chest (reports rib pain, not angina)    Home Living Family/patient expects to be discharged to:: Private residence Living Arrangements: Alone Available Help at Discharge: Family(alone at night, grand daughter in & out ~7(?) hr/d) Type of Home: Apartment Home Access: Level entry     Home Layout: One level Home Equipment: Cane - single point;Walker - 2 wheels;Walker - 4 wheels;Bedside commode      Prior Function Level of Independence: Independent with assistive device(s)(grand daughter has been helping with ADLs since  last fall)         Comments: Pt reports her only 2 falls in the last 6 months were these recent ones     Hand Dominance        Extremity/Trunk Assessment   Upper Extremity Assessment Upper Extremity Assessment: Generalized weakness(pain limited due to ribs)    Lower Extremity Assessment Lower Extremity Assessment: Generalized weakness;Overall [facility] for tasks assessed       Communication   Communication: Interpreter utilized(Christian - Spanish, language line [phone])  Cognition Arousal/Alertness: Awake/alert Behavior During Therapy: WFL for tasks  assessed/performed Overall Cognitive Status: Within Functional Limits for tasks assessed                                        General Comments      Exercises     Assessment/Plan    PT Assessment Patient needs continued PT services  PT Problem List Decreased strength;Decreased activity tolerance;Decreased balance;Decreased mobility;Decreased safety awareness;Pain;Decreased knowledge of use of DME;Cardiopulmonary status limiting activity       PT Treatment Interventions DME instruction;Gait training;Stair training;Functional mobility training;Therapeutic activities;Therapeutic exercise;Balance training;Patient/family education;Neuromuscular re-education    PT Goals (Current goals can be found in the Care Plan section)  Acute Rehab PT Goals Patient Stated Goal: go home PT Goal Formulation: With patient Time For Goal Achievement: 06/05/19 Potential to Achieve Goals: Good    Frequency Min 2X/week   Barriers to discharge        Co-evaluation               AM-PAC PT "6 Clicks" Mobility  Outcome Measure Help needed turning from your back to your side while in a flat bed without using bedrails?: A Little Help needed moving from lying on your back to sitting on the side of a flat bed without using bedrails?: A Lot Help needed moving to and from a bed to a chair (including a wheelchair)?: A Little Help needed standing up from a chair using your arms (e.g., wheelchair or bedside chair)?: A Little Help needed to walk in hospital room?: A Little Help needed climbing 3-5 steps with a railing? : A Little 6 Click Score: 17    End of Session Equipment Utilized During Treatment: Gait belt Activity Tolerance: Patient tolerated treatment well;Patient limited by fatigue Patient left: with bed alarm set;with call bell/phone within reach Nurse Communication: Mobility status(used bathroom, tele-lead off) PT Visit Diagnosis: Muscle weakness (generalized)  (M62.81);Unsteadiness on feet (R26.81)    Time: [id number] PT Time Calculation (min) (ACUTE ONLY): 45 min   Charges:   PT Evaluation $PT Eval Low Complexity: 1 Low PT Treatments $Gait Training: 8-22 mins $Therapeutic Activity: 8-22 mins        Kreg Shropshire, DPT 05/22/2019, 4:44 PM

## 2019-05-22 NOTE — ED Notes (Addendum)
ED TO INPATIENT HANDOFF REPORT  ED Nurse Name and Phone #: Karena Addison [phone]  S Name/Age/Gender Shelly Freeman 69 y.o. female Room/Bed: ED30A/ED30A  Code Status   Code Status: Full Code  Home/SNF/Other Home Patient oriented to: self, place, time and situation Is this baseline? Yes   Triage Complete: Triage complete  Chief Complaint Sepsis [facility]) [A41.9]  Triage Note Pt broke her ribs a few weeks ago and fell out of bed today per caregiver. C/o her rib pain.    Allergies No Known Allergies  Level of Care/Admitting Diagnosis ED Disposition    ED Disposition Condition [facility] Area: [place] [[id number]]  Level of Care: Med-Surg [16]  Covid Evaluation: Asymptomatic Screening Protocol (No Symptoms)  Diagnosis: [facility]is [id number]  Admitting Physician: Christel Mormon [id number]  Attending Physician: Christel Mormon [id number]  Estimated length of stay: past midnight tomorrow  Certification:: I certify this patient will need inpatient services for at least 2 midnights       B Medical/Surgery History Past Medical History:  Diagnosis Date  . Arthritis   . Collagen vascular disease ([place])   . DDD (degenerative disc disease), lumbar 05/14/2016  . Depression   . Heart valve problem    per grandaughter need surgery but they told her may not survive;  [name] Hypertension   . Hypothyroidism   . Lupus ([place])   . Neuromuscular disorder ([place])   . Thyroid disease   . Vitamin D deficiency 05/14/2016   Past Surgical History:  Procedure Laterality Date  . APPENDECTOMY    . BREAST BIOPSY Left    neg  . BREAST LUMPECTOMY Left   . CESAREAN SECTION    . COLONOSCOPY WITH PROPOFOL N/A 07/01/2017   Procedure: COLONOSCOPY WITH PROPOFOL;  Surgeon: Manya Silvas, MD;  Location: [facility] ENDOSCOPY;  Service: Endoscopy;  Laterality: N/A;  . ESOPHAGOGASTRODUODENOSCOPY (EGD) WITH PROPOFOL N/A 07/01/2017   Procedure: ESOPHAGOGASTRODUODENOSCOPY (EGD) WITH PROPOFOL;   Surgeon: Manya Silvas, MD;  Location: [facility] ENDOSCOPY;  Service: Endoscopy;  Laterality: N/A;  . GASTRIC BYPASS    . TUBAL LIGATION       A IV Location/Drains/Wounds Patient Lines/Drains/Airways Status   Active Line/Drains/Airways    Name:   Placement date:   Placement time:   Site:   Days:   Peripheral IV 05/21/19 Right Antecubital   05/21/19    2140    Antecubital   1   Peripheral IV 05/21/19 Right Hand   05/21/19    2336    Hand   1          Intake/Output Last 24 hours  Intake/Output Summary (Last 24 hours) at 05/22/2019 1009 Last data filed at 05/22/2019 [id number] Gross per 24 hour  Intake 2100 ml  Output --  Net 2100 ml    Labs/Imaging Results for orders placed or performed during the hospital encounter of 05/21/19 (from the past 48 hour(s))  CBC with Differential     Status: Abnormal   Collection Time: 05/21/19  9:33 PM  Result Value Ref Range   WBC 27.8 (H) 4.0 - 10.5 K/uL   RBC 4.18 3.87 - 5.11 MIL/uL   Hemoglobin 12.8 12.0 - 15.0 g/dL   HCT 39.7 36.0 - 46.0 %   MCV 95.0 80.0 - 100.0 fL   MCH 30.6 26.0 - 34.0 pg   MCHC 32.2 30.0 - 36.0 g/dL   RDW 14.1 11.5 - 15.5 %   Platelets 184 150 - 400 K/uL  nRBC 0.0 0.0 - 0.2 %   Neutrophils Relative % 83 %   Neutro Abs 23.0 (H) 1.7 - 7.7 K/uL   Lymphocytes Relative 4 %   Lymphs Abs 1.2 0.7 - 4.0 K/uL   Monocytes Relative 8 %   Monocytes Absolute 2.2 (H) 0.1 - 1.0 K/uL   Eosinophils Relative 0 %   Eosinophils Absolute 0.0 0.0 - 0.5 K/uL   Basophils Relative 0 %   Basophils Absolute 0.1 0.0 - 0.1 K/uL   WBC Morphology MILD LEFT SHIFT (1-5% METAS, OCC MYELO, OCC BANDS)    RBC Morphology MORPHOLOGY UNREMARKABLE    Smear Review Normal platelet morphology    Immature Granulocytes 5 %   Abs Immature Granulocytes 1.41 (H) 0.00 - 0.07 K/uL    Comment: Performed at [facility], [address] [place], [place] [address]  Comprehensive metabolic panel     Status: Abnormal   Collection Time: 05/21/19  9:33 PM   Result Value Ref Range   Sodium 139 135 - 145 mmol/L   Potassium 4.2 3.5 - 5.1 mmol/L   Chloride 108 98 - 111 mmol/L   CO2 22 22 - 32 mmol/L   Glucose, Bld 135 (H) 70 - 99 mg/dL   BUN 29 (H) 8 - 23 mg/dL   Creatinine, Ser 1.34 (H) 0.44 - 1.00 mg/dL   Calcium 8.4 (L) 8.9 - 10.3 mg/dL   Total Protein 6.5 6.5 - 8.1 g/dL   Albumin 3.3 (L) 3.5 - 5.0 g/dL   AST 17 15 - 41 U/L   ALT 11 0 - 44 U/L   Alkaline Phosphatase 82 38 - 126 U/L   Total Bilirubin 1.0 0.3 - 1.2 mg/dL   GFR calc non Af Amer 41 (L) >60 mL/min   GFR calc Af Amer 48 (L) >60 mL/min   Anion gap 9 5 - 15    Comment: Performed at [facility], [address] [place], [place] [address]  Protime-INR     Status: None   Collection Time: 05/21/19  9:33 PM  Result Value Ref Range   Prothrombin Time 14.2 11.4 - 15.2 seconds   INR 1.1 0.8 - 1.2    Comment: (NOTE) INR goal varies based on device and disease states. Performed at [facility], [address] [place], [place] [address]   APTT     Status: None   Collection Time: 05/21/19  9:33 PM  Result Value Ref Range   aPTT 30 24 - 36 seconds    Comment: Performed at [facility], [place], [place] [address]  Troponin I (High Sensitivity)     Status: None   Collection Time: 05/21/19  9:33 PM  Result Value Ref Range   Troponin I (High Sensitivity) 6 <18 ng/L    Comment: (NOTE) Elevated high sensitivity troponin I (hsTnI) values and significant  changes across serial measurements may suggest ACS but many other  chronic and acute conditions are known to elevate hsTnI results.  Refer to the "Links" section for chest pain algorithms and additional  guidance. Performed at [facility], [address] [place], [place] [address]   CK     Status: Abnormal   Collection Time: 05/21/19  9:33 PM  Result Value Ref Range   Total CK 260 (H) 38 - 234 U/L    Comment: Performed at [facility], [address]  [place], [place] [address]  Urinalysis, Routine w reflex microscopic  Status: Abnormal   Collection Time: 05/21/19  9:33 PM  Result Value Ref Range   Color, Urine YELLOW (A) YELLOW   APPearance CLOUDY (A) CLEAR   Specific Gravity, Urine 1.013 1.005 - 1.030   pH 5.0 5.0 - 8.0   Glucose, UA NEGATIVE NEGATIVE mg/dL   Hgb urine dipstick NEGATIVE NEGATIVE   Bilirubin Urine NEGATIVE NEGATIVE   Ketones, ur NEGATIVE NEGATIVE mg/dL   Protein, ur 100 (A) NEGATIVE mg/dL   Nitrite NEGATIVE NEGATIVE   Leukocytes,Ua MODERATE (A) NEGATIVE   RBC / HPF 6-10 0 - 5 RBC/hpf   WBC, UA >50 (H) 0 - 5 WBC/hpf   Bacteria, UA MANY (A) NONE SEEN   Squamous Epithelial / LPF 0-5 0 - 5   WBC Clumps PRESENT    Mucus PRESENT     Comment: Performed at [facility], [address], [place], [place] [address]  Respiratory Panel by RT PCR (Flu A&B, Covid) - Nasopharyngeal Swab     Status: None   Collection Time: 05/21/19 10:18 PM   Specimen: Nasopharyngeal Swab  Result Value Ref Range   SARS Coronavirus 2 by RT PCR NEGATIVE NEGATIVE    Comment: (NOTE) SARS-CoV-2 target nucleic acids are NOT DETECTED. The SARS-CoV-2 RNA is generally detectable in upper respiratoy specimens during the acute phase of infection. The lowest concentration of SARS-CoV-2 viral copies this assay can detect is 131 copies/mL. A negative result does not preclude SARS-Cov-2 infection and should not be used as the sole basis for treatment or other patient management decisions. A negative result may occur with  improper specimen collection/handling, submission of specimen other than nasopharyngeal swab, presence of viral mutation(s) within the areas targeted by this assay, and inadequate number of viral copies (<131 copies/mL). A negative result must be combined with clinical observations, patient history, and epidemiological information. The expected result is Negative. Fact Sheet for Patients:   PinkCheek.be Fact Sheet for Healthcare Providers:  GravelBags.it This test is not yet ap proved or cleared by the [place] FDA and  has been authorized for detection and/or diagnosis of SARS-CoV-2 by FDA under an Emergency Use Authorization (EUA). This EUA will remain  in effect (meaning this test can be used) for the duration of the COVID-19 declaration under Section 564(b)(1) of the Act, 21 U.S.C. section 360bbb-3(b)(1), unless the authorization is terminated or revoked sooner.    Influenza A by PCR NEGATIVE NEGATIVE   Influenza B by PCR NEGATIVE NEGATIVE    Comment: (NOTE) The Xpert Xpress SARS-CoV-2/FLU/RSV assay is intended as an aid in  the diagnosis of influenza from Nasopharyngeal swab specimens and  should not be used as a sole basis for treatment. Nasal washings and  aspirates are unacceptable for Xpert Xpress SARS-CoV-2/FLU/RSV  testing. Fact Sheet for Patients: PinkCheek.be Fact Sheet for Healthcare Providers: GravelBags.it This test is not yet approved or cleared by the [place] FDA and  has been authorized for detection and/or diagnosis of SARS-CoV-2 by  FDA under an Emergency Use Authorization (EUA). This EUA will remain  in effect (meaning this test can be used) for the duration of the  Covid-19 declaration under Section 564(b)(1) of the Act, 21  U.S.C. section 360bbb-3(b)(1), unless the authorization is  terminated or revoked. Performed at [facility], [place], [place] [address]   Lactic acid, plasma     Status: None   Collection Time: 05/21/19 11:30 PM  Result Value Ref Range   Lactic Acid, Venous 1.0 0.5 -  1.9 mmol/L    Comment: Performed at [facility], [address] [place], [place] [address]  Blood culture (routine x 2)     Status: None (Preliminary result)   Collection Time: 05/21/19 11:30 PM    Specimen: BLOOD  Result Value Ref Range   Specimen Description BLOOD BLOOD RIGHT HAND    Special Requests      BOTTLES DRAWN AEROBIC AND ANAEROBIC Blood Culture adequate volume   Culture      NO GROWTH < 12 HOURS Performed at [facility], [address], [place], [place] [address]    Report Status PENDING   Blood culture (routine x 2)     Status: None (Preliminary result)   Collection Time: 05/21/19 11:30 PM   Specimen: BLOOD  Result Value Ref Range   Specimen Description BLOOD BLOOD LEFT FOREARM    Special Requests      BOTTLES DRAWN AEROBIC AND ANAEROBIC Blood Culture adequate volume   Culture      NO GROWTH < 12 HOURS Performed at [facility], [address], [place], [place] [address]    Report Status PENDING   Troponin I (High Sensitivity)     Status: None   Collection Time: 05/21/19 11:30 PM  Result Value Ref Range   Troponin I (High Sensitivity) 8 <18 ng/L    Comment: (NOTE) Elevated high sensitivity troponin I (hsTnI) values and significant  changes across serial measurements may suggest ACS but many other  chronic and acute conditions are known to elevate hsTnI results.  Refer to the "Links" section for chest pain algorithms and additional  guidance. Performed at [facility], [address] [place], [place] [id number]   Basic metabolic panel     Status: Abnormal   Collection Time: 05/22/19  5:48 AM  Result Value Ref Range   Sodium 139 135 - 145 mmol/L   Potassium 3.8 3.5 - 5.1 mmol/L   Chloride 112 (H) 98 - 111 mmol/L   CO2 19 (L) 22 - 32 mmol/L   Glucose, Bld 91 70 - 99 mg/dL   BUN 23 8 - 23 mg/dL   Creatinine, Ser 0.90 0.44 - 1.00 mg/dL   Calcium 7.6 (L) 8.9 - 10.3 mg/dL   GFR calc non Af Amer >60 >60 mL/min   GFR calc Af Amer >60 >60 mL/min   Anion gap 8 5 - 15    Comment: Performed at [facility], [address] [place], [place] [address]  CBC     Status: Abnormal   Collection Time: 05/22/19  5:48 AM   Result Value Ref Range   WBC 17.1 (H) 4.0 - 10.5 K/uL   RBC 3.88 3.87 - 5.11 MIL/uL   Hemoglobin 12.0 12.0 - 15.0 g/dL   HCT 36.5 36.0 - 46.0 %   MCV 94.1 80.0 - 100.0 fL   MCH 30.9 26.0 - 34.0 pg   MCHC 32.9 30.0 - 36.0 g/dL   RDW 14.2 11.5 - 15.5 %   Platelets 149 (L) 150 - 400 K/uL   nRBC 0.0 0.0 - 0.2 %    Comment: Performed at [facility], [address] [place], [place] [address]  TSH     Status: None   Collection Time: 05/22/19  5:48 AM  Result Value Ref Range   TSH 1.763 0.350 - 4.500 uIU/mL    Comment: Performed by a 3rd Generation assay with a functional sensitivity of <=0.01 uIU/mL. Performed at [facility], [place]  [address], [place], [place] [address]    CT ABDOMEN PELVIS WO CONTRAST  Result Date: 05/21/2019 CLINICAL DATA:  Initial evaluation for acute rib pain status post fall. History of recent rib fractures. EXAM: CT CHEST, ABDOMEN AND PELVIS WITHOUT CONTRAST TECHNIQUE: Multidetector CT imaging of the chest, abdomen and pelvis was performed following the standard protocol without IV contrast. COMPARISON:  Comparison made with prior CT from 01/29/2019. FINDINGS: CT CHEST FINDINGS Cardiovascular: Limited noncontrast evaluation of the intrathoracic aorta is unremarkable without aneurysm or definite acute abnormality. Mild atherosclerotic change noted within the aortic arch. Partially visualized great vessels within normal limits. Heart size within normal limits. No pericardial effusion. Scattered coronary artery calcifications noted. Main pulmonary artery dilated up to 3.7 cm, suggesting possible underlying pulmonary hypertension. Mediastinum/Nodes: No pathologically enlarged mediastinal, hilar, or axillary lymph nodes. No mediastinal hematoma or mass. Esophagus demonstrates no acute finding. Small hiatal hernia noted. Lungs/Pleura: Tracheobronchial tree intact and patent. Lungs well inflated. Scattered linear atelectatic changes seen dependently within the  lung bases bilaterally. No focal infiltrates or evidence for pulmonary contusion. No edema or pleural effusion. No pneumothorax. Small granuloma again noted at the left upper lobe, unchanged. No other worrisome pulmonary nodule or mass. Musculoskeletal: External soft tissues demonstrate no acute abnormality. No acute osseous abnormality. Multiple remotely healed right-sided rib fractures (approximately ribs 4 through 9). CT ABDOMEN PELVIS FINDINGS Hepatobiliary: Liver demonstrates a normal unenhanced appearance. Gallbladder within normal limits. No biliary dilatation. Pancreas: Pancreas within normal limits. Spleen: Spleen intact and normal. Adrenals/Urinary Tract: Adrenal glands within normal limits. Left kidney unremarkable without nephrolithiasis or hydronephrosis. No left-sided hydroureter. On the right, there is mild hydronephrosis involving the right kidney with associated mild perinephric fat stranding. No obstructive radiopaque calculi seen along the course of the right renal collecting system. No stones seen within the right kidney itself. No significant right-sided hydroureter. Partially distended bladder within normal limits. No layering stones within the bladder lumen. 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 No evidence for bowel obstruction or acute bowel injury. No acute inflammatory changes seen about the bowels. Vascular/Lymphatic: Intra-abdominal aorta of normal caliber without obvious abnormality on this noncontrast examination. No adenopathy. Reproductive: Uterus and ovaries within normal limits. Other: No free air or fluid. No mesenteric or retroperitoneal hematoma. Musculoskeletal: External soft tissues demonstrate no acute finding. No acute fracture or other osseous abnormality about the pelvis. Few scattered benign bone islands noted. No other discrete or worrisome osseous lesions. CT THORACIC SPINE FINDINGS Alignment: Physiologic with preservation  of the normal thoracic kyphosis. No listhesis or subluxation. Vertebra: Vertebral body height maintained without evidence for acute or chronic fracture. No discrete or worrisome osseous lesions. Small benign bone island noted within the T3 vertebral body. Paraspinal soft tissues: Paraspinous soft tissues demonstrate no acute finding. Disc levels: Small degenerative endplate Schmorl's node noted at the superior endplate of T6. No significant disc pathology seen within the thoracic spine. No significant stenosis. CT LUMBAR SPINE FINDINGS Segmentation: Standard. Lowest well-formed disc space labeled the L5-S1 level. Alignment: Mild dextroscoliosis, apex at L3. Alignment otherwise normal preservation of the normal lumbar lordosis. No listhesis or subluxation. Vertebra: Vertebral body height maintained without evidence for acute or chronic fracture. Visualized sacrum and pelvis intact. SI joints approximated symmetric. No worrisome osseous lesions. Paraspinal soft tissues: Paraspinous soft tissues demonstrate no acute finding. Disc levels: L1-2:  Unremarkable. L2-3: Mild disc bulge, asymmetric to the left. Mild bilateral facet hypertrophy. No significant stenosis. L3-4: Mild disc bulge. Mild to  moderate facet hypertrophy. No significant spinal stenosis. Mild bilateral foraminal narrowing. L4-5: Disc bulge with disc desiccation. Moderate facet hypertrophy. Central canal remains patent. Mild to moderate bilateral L4 foraminal stenosis, worse on the right. L5-S1: Disc bulge with disc desiccation. Moderate to severe right with mild left facet hypertrophy. No significant spinal stenosis. Severe right with moderate left L5 foraminal narrowing. IMPRESSION: CT CHEST, ABDOMEN, AND PELVIS IMPRESSION: 1. No CT evidence for acute traumatic injury within the chest, abdomen, and pelvis. 2. Mild right-sided hydronephrosis. No obstructive stone identified. Finding of uncertain etiology, and could reflect sequelae of a recently passed  stone or possibly infection. Correlation with urinalysis recommended. 3. No other acute abnormality within the chest, abdomen, and pelvis. 4. Multiple remotely healed right-sided rib fractures. CT THORACIC SPINE IMPRESSION No acute traumatic injury within the thoracic spine. CT LUMBAR SPINE IMPRESSION 1. No acute traumatic injury within the lumbar spine. 2. Degenerative spondylosis at L4-5 and L5-S1 with resultant mild to moderate bilateral L4 foraminal narrowing, with more moderate to severe bilateral L5 foraminal stenosis. Electronically Signed   By: Jeannine Boga M.D.   On: 05/21/2019 23:00   CT Head Wo Contrast  Result Date: 05/21/2019 CLINICAL DATA:  [place] from bed EXAM: CT HEAD WITHOUT CONTRAST CT CERVICAL SPINE WITHOUT CONTRAST TECHNIQUE: Multidetector CT imaging of the head and cervical spine was performed following the standard protocol without intravenous contrast. Multiplanar CT image reconstructions of the cervical spine were also generated. COMPARISON:  CT head 04/15/2017 FINDINGS: CT HEAD FINDINGS Brain: No evidence of acute infarction, hemorrhage, hydrocephalus, extra-axial collection or mass lesion/mass effect. Symmetric prominence of the ventricles, cisterns and sulci compatible with parenchymal volume loss. Mixed patchy and confluent areas of white matter hypoattenuation are most compatible with moderate chronic microvascular angiopathy, overall extent of which is similar to comparison. Vascular: Atherosclerotic calcification of the carotid siphons and intradural vertebral arteries. No hyperdense vessel. Skull: There is some mild left frontal scalp swelling without large hematoma or subjacent calvarial fracture. Benign hyperostosis frontalis interna is incidentally noted. Sinuses/Orbits: Paranasal sinuses and mastoid air cells are predominantly clear. Included orbital structures are unremarkable. Other: None CT CERVICAL SPINE FINDINGS Alignment: Preservation of the normal cervical  lordosis without traumatic listhesis. No abnormal facet widening. Normal alignment of the craniocervical and atlantoaxial articulations. Skull base and vertebrae: No acute fracture. No primary bone lesion or focal pathologic process. Soft tissues and spinal canal: No pre or paravertebral fluid or swelling. No visible canal hematoma. Disc levels: Minimal diffuse intervertebral disc height loss with early spondylitic endplate changes, maximal C6-7. no significant neural foraminal stenosis or spinal canal narrowing. Upper chest: No acute abnormality in the upper chest or imaged lung apices. Dedicated CT of the chest was performed concurrently. Other: Atherosclerotic calcification of the cervical carotid arteries. IMPRESSION: 1. Mild left frontal scalp swelling without large hematoma or subjacent calvarial fracture. 2. No acute intracranial abnormality. 3. Stable parenchymal volume loss and chronic microvascular ischemic white matter disease. 4. No acute cervical spine fracture or traumatic listhesis. 5. Minimal cervical spondylitic changes. Electronically Signed   By: Lovena Le M.D.   On: 05/21/2019 21:52   CT Chest Wo Contrast  Result Date: 05/21/2019 CLINICAL DATA:  Initial evaluation for acute rib pain status post fall. History of recent rib fractures. EXAM: CT CHEST, ABDOMEN AND PELVIS WITHOUT CONTRAST TECHNIQUE: Multidetector CT imaging of the chest, abdomen and pelvis was performed following the standard protocol without IV contrast. COMPARISON:  Comparison made with prior CT from 01/29/2019. FINDINGS: CT  CHEST FINDINGS Cardiovascular: Limited noncontrast evaluation of the intrathoracic aorta is unremarkable without aneurysm or definite acute abnormality. Mild atherosclerotic change noted within the aortic arch. Partially visualized great vessels within normal limits. Heart size within normal limits. No pericardial effusion. Scattered coronary artery calcifications noted. Main pulmonary artery dilated up  to 3.7 cm, suggesting possible underlying pulmonary hypertension. Mediastinum/Nodes: No pathologically enlarged mediastinal, hilar, or axillary lymph nodes. No mediastinal hematoma or mass. Esophagus demonstrates no acute finding. Small hiatal hernia noted. Lungs/Pleura: Tracheobronchial tree intact and patent. Lungs well inflated. Scattered linear atelectatic changes seen dependently within the lung bases bilaterally. No focal infiltrates or evidence for pulmonary contusion. No edema or pleural effusion. No pneumothorax. Small granuloma again noted at the left upper lobe, unchanged. No other worrisome pulmonary nodule or mass. Musculoskeletal: External soft tissues demonstrate no acute abnormality. No acute osseous abnormality. Multiple remotely healed right-sided rib fractures (approximately ribs 4 through 9). CT ABDOMEN PELVIS FINDINGS Hepatobiliary: Liver demonstrates a normal unenhanced appearance. Gallbladder within normal limits. No biliary dilatation. Pancreas: Pancreas within normal limits. Spleen: Spleen intact and normal. Adrenals/Urinary Tract: Adrenal glands within normal limits. Left kidney unremarkable without nephrolithiasis or hydronephrosis. No left-sided hydroureter. On the right, there is mild hydronephrosis involving the right kidney with associated mild perinephric fat stranding. No obstructive radiopaque calculi seen along the course of the right renal collecting system. No stones seen within the right kidney itself. No significant right-sided hydroureter. Partially distended bladder within normal limits. No layering stones within the bladder lumen. 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 No evidence for bowel obstruction or acute bowel injury. No acute inflammatory changes seen about the bowels. Vascular/Lymphatic: Intra-abdominal aorta of normal caliber without obvious abnormality on this noncontrast examination. No adenopathy. Reproductive:  Uterus and ovaries within normal limits. Other: No free air or fluid. No mesenteric or retroperitoneal hematoma. Musculoskeletal: External soft tissues demonstrate no acute finding. No acute fracture or other osseous abnormality about the pelvis. Few scattered benign bone islands noted. No other discrete or worrisome osseous lesions. CT THORACIC SPINE FINDINGS Alignment: Physiologic with preservation of the normal thoracic kyphosis. No listhesis or subluxation. Vertebra: Vertebral body height maintained without evidence for acute or chronic fracture. No discrete or worrisome osseous lesions. Small benign bone island noted within the T3 vertebral body. Paraspinal soft tissues: Paraspinous soft tissues demonstrate no acute finding. Disc levels: Small degenerative endplate Schmorl's node noted at the superior endplate of T6. No significant disc pathology seen within the thoracic spine. No significant stenosis. CT LUMBAR SPINE FINDINGS Segmentation: Standard. Lowest well-formed disc space labeled the L5-S1 level. Alignment: Mild dextroscoliosis, apex at L3. Alignment otherwise normal preservation of the normal lumbar lordosis. No listhesis or subluxation. Vertebra: Vertebral body height maintained without evidence for acute or chronic fracture. Visualized sacrum and pelvis intact. SI joints approximated symmetric. No worrisome osseous lesions. Paraspinal soft tissues: Paraspinous soft tissues demonstrate no acute finding. Disc levels: L1-2:  Unremarkable. L2-3: Mild disc bulge, asymmetric to the left. Mild bilateral facet hypertrophy. No significant stenosis. L3-4: Mild disc bulge. Mild to moderate facet hypertrophy. No significant spinal stenosis. Mild bilateral foraminal narrowing. L4-5: Disc bulge with disc desiccation. Moderate facet hypertrophy. Central canal remains patent. Mild to moderate bilateral L4 foraminal stenosis, worse on the right. L5-S1: Disc bulge with disc desiccation. Moderate to severe right with  mild left facet hypertrophy. No significant spinal stenosis. Severe right with moderate left L5 foraminal narrowing. IMPRESSION: CT CHEST, ABDOMEN, AND PELVIS IMPRESSION: 1. No  CT evidence for acute traumatic injury within the chest, abdomen, and pelvis. 2. Mild right-sided hydronephrosis. No obstructive stone identified. Finding of uncertain etiology, and could reflect sequelae of a recently passed stone or possibly infection. Correlation with urinalysis recommended. 3. No other acute abnormality within the chest, abdomen, and pelvis. 4. Multiple remotely healed right-sided rib fractures. CT THORACIC SPINE IMPRESSION No acute traumatic injury within the thoracic spine. CT LUMBAR SPINE IMPRESSION 1. No acute traumatic injury within the lumbar spine. 2. Degenerative spondylosis at L4-5 and L5-S1 with resultant mild to moderate bilateral L4 foraminal narrowing, with more moderate to severe bilateral L5 foraminal stenosis. Electronically Signed   By: Jeannine Boga M.D.   On: 05/21/2019 23:00   CT Cervical Spine Wo Contrast  Result Date: 05/21/2019 CLINICAL DATA:  [place] from bed EXAM: CT HEAD WITHOUT CONTRAST CT CERVICAL SPINE WITHOUT CONTRAST TECHNIQUE: Multidetector CT imaging of the head and cervical spine was performed following the standard protocol without intravenous contrast. Multiplanar CT image reconstructions of the cervical spine were also generated. COMPARISON:  CT head 04/15/2017 FINDINGS: CT HEAD FINDINGS Brain: No evidence of acute infarction, hemorrhage, hydrocephalus, extra-axial collection or mass lesion/mass effect. Symmetric prominence of the ventricles, cisterns and sulci compatible with parenchymal volume loss. Mixed patchy and confluent areas of white matter hypoattenuation are most compatible with moderate chronic microvascular angiopathy, overall extent of which is similar to comparison. Vascular: Atherosclerotic calcification of the carotid siphons and intradural vertebral arteries.  No hyperdense vessel. Skull: There is some mild left frontal scalp swelling without large hematoma or subjacent calvarial fracture. Benign hyperostosis frontalis interna is incidentally noted. Sinuses/Orbits: Paranasal sinuses and mastoid air cells are predominantly clear. Included orbital structures are unremarkable. Other: None CT CERVICAL SPINE FINDINGS Alignment: Preservation of the normal cervical lordosis without traumatic listhesis. No abnormal facet widening. Normal alignment of the craniocervical and atlantoaxial articulations. Skull base and vertebrae: No acute fracture. No primary bone lesion or focal pathologic process. Soft tissues and spinal canal: No pre or paravertebral fluid or swelling. No visible canal hematoma. Disc levels: Minimal diffuse intervertebral disc height loss with early spondylitic endplate changes, maximal C6-7. no significant neural foraminal stenosis or spinal canal narrowing. Upper chest: No acute abnormality in the upper chest or imaged lung apices. Dedicated CT of the chest was performed concurrently. Other: Atherosclerotic calcification of the cervical carotid arteries. IMPRESSION: 1. Mild left frontal scalp swelling without large hematoma or subjacent calvarial fracture. 2. No acute intracranial abnormality. 3. Stable parenchymal volume loss and chronic microvascular ischemic white matter disease. 4. No acute cervical spine fracture or traumatic listhesis. 5. Minimal cervical spondylitic changes. Electronically Signed   By: Lovena Le M.D.   On: 05/21/2019 21:52   CT T-SPINE NO CHARGE  Result Date: 05/21/2019 CLINICAL DATA:  Initial evaluation for acute rib pain status post fall. History of recent rib fractures. EXAM: CT CHEST, ABDOMEN AND PELVIS WITHOUT CONTRAST TECHNIQUE: Multidetector CT imaging of the chest, abdomen and pelvis was performed following the standard protocol without IV contrast. COMPARISON:  Comparison made with prior CT from 01/29/2019. FINDINGS: CT  CHEST FINDINGS Cardiovascular: Limited noncontrast evaluation of the intrathoracic aorta is unremarkable without aneurysm or definite acute abnormality. Mild atherosclerotic change noted within the aortic arch. Partially visualized great vessels within normal limits. Heart size within normal limits. No pericardial effusion. Scattered coronary artery calcifications noted. Main pulmonary artery dilated up to 3.7 cm, suggesting possible underlying pulmonary hypertension. Mediastinum/Nodes: No pathologically enlarged mediastinal, hilar, or axillary lymph nodes. No  mediastinal hematoma or mass. Esophagus demonstrates no acute finding. Small hiatal hernia noted. Lungs/Pleura: Tracheobronchial tree intact and patent. Lungs well inflated. Scattered linear atelectatic changes seen dependently within the lung bases bilaterally. No focal infiltrates or evidence for pulmonary contusion. No edema or pleural effusion. No pneumothorax. Small granuloma again noted at the left upper lobe, unchanged. No other worrisome pulmonary nodule or mass. Musculoskeletal: External soft tissues demonstrate no acute abnormality. No acute osseous abnormality. Multiple remotely healed right-sided rib fractures (approximately ribs 4 through 9). CT ABDOMEN PELVIS FINDINGS Hepatobiliary: Liver demonstrates a normal unenhanced appearance. Gallbladder within normal limits. No biliary dilatation. Pancreas: Pancreas within normal limits. Spleen: Spleen intact and normal. Adrenals/Urinary Tract: Adrenal glands within normal limits. Left kidney unremarkable without nephrolithiasis or hydronephrosis. No left-sided hydroureter. On the right, there is mild hydronephrosis involving the right kidney with associated mild perinephric fat stranding. No obstructive radiopaque calculi seen along the course of the right renal collecting system. No stones seen within the right kidney itself. No significant right-sided hydroureter. Partially distended bladder within  normal limits. No layering stones within the bladder lumen. 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 No evidence for bowel obstruction or acute bowel injury. No acute inflammatory changes seen about the bowels. Vascular/Lymphatic: Intra-abdominal aorta of normal caliber without obvious abnormality on this noncontrast examination. No adenopathy. Reproductive: Uterus and ovaries within normal limits. Other: No free air or fluid. No mesenteric or retroperitoneal hematoma. Musculoskeletal: External soft tissues demonstrate no acute finding. No acute fracture or other osseous abnormality about the pelvis. Few scattered benign bone islands noted. No other discrete or worrisome osseous lesions. CT THORACIC SPINE FINDINGS Alignment: Physiologic with preservation of the normal thoracic kyphosis. No listhesis or subluxation. Vertebra: Vertebral body height maintained without evidence for acute or chronic fracture. No discrete or worrisome osseous lesions. Small benign bone island noted within the T3 vertebral body. Paraspinal soft tissues: Paraspinous soft tissues demonstrate no acute finding. Disc levels: Small degenerative endplate Schmorl's node noted at the superior endplate of T6. No significant disc pathology seen within the thoracic spine. No significant stenosis. CT LUMBAR SPINE FINDINGS Segmentation: Standard. Lowest well-formed disc space labeled the L5-S1 level. Alignment: Mild dextroscoliosis, apex at L3. Alignment otherwise normal preservation of the normal lumbar lordosis. No listhesis or subluxation. Vertebra: Vertebral body height maintained without evidence for acute or chronic fracture. Visualized sacrum and pelvis intact. SI joints approximated symmetric. No worrisome osseous lesions. Paraspinal soft tissues: Paraspinous soft tissues demonstrate no acute finding. Disc levels: L1-2:  Unremarkable. L2-3: Mild disc bulge, asymmetric to the left. Mild bilateral facet  hypertrophy. No significant stenosis. L3-4: Mild disc bulge. Mild to moderate facet hypertrophy. No significant spinal stenosis. Mild bilateral foraminal narrowing. L4-5: Disc bulge with disc desiccation. Moderate facet hypertrophy. Central canal remains patent. Mild to moderate bilateral L4 foraminal stenosis, worse on the right. L5-S1: Disc bulge with disc desiccation. Moderate to severe right with mild left facet hypertrophy. No significant spinal stenosis. Severe right with moderate left L5 foraminal narrowing. IMPRESSION: CT CHEST, ABDOMEN, AND PELVIS IMPRESSION: 1. No CT evidence for acute traumatic injury within the chest, abdomen, and pelvis. 2. Mild right-sided hydronephrosis. No obstructive stone identified. Finding of uncertain etiology, and could reflect sequelae of a recently passed stone or possibly infection. Correlation with urinalysis recommended. 3. No other acute abnormality within the chest, abdomen, and pelvis. 4. Multiple remotely healed right-sided rib fractures. CT THORACIC SPINE IMPRESSION No acute traumatic injury within the thoracic spine. CT  LUMBAR SPINE IMPRESSION 1. No acute traumatic injury within the lumbar spine. 2. Degenerative spondylosis at L4-5 and L5-S1 with resultant mild to moderate bilateral L4 foraminal narrowing, with more moderate to severe bilateral L5 foraminal stenosis. Electronically Signed   By: Jeannine Boga M.D.   On: 05/21/2019 23:00   CT L-SPINE NO CHARGE  Result Date: 05/21/2019 CLINICAL DATA:  Initial evaluation for acute rib pain status post fall. History of recent rib fractures. EXAM: CT CHEST, ABDOMEN AND PELVIS WITHOUT CONTRAST TECHNIQUE: Multidetector CT imaging of the chest, abdomen and pelvis was performed following the standard protocol without IV contrast. COMPARISON:  Comparison made with prior CT from 01/29/2019. FINDINGS: CT CHEST FINDINGS Cardiovascular: Limited noncontrast evaluation of the intrathoracic aorta is unremarkable without  aneurysm or definite acute abnormality. Mild atherosclerotic change noted within the aortic arch. Partially visualized great vessels within normal limits. Heart size within normal limits. No pericardial effusion. Scattered coronary artery calcifications noted. Main pulmonary artery dilated up to 3.7 cm, suggesting possible underlying pulmonary hypertension. Mediastinum/Nodes: No pathologically enlarged mediastinal, hilar, or axillary lymph nodes. No mediastinal hematoma or mass. Esophagus demonstrates no acute finding. Small hiatal hernia noted. Lungs/Pleura: Tracheobronchial tree intact and patent. Lungs well inflated. Scattered linear atelectatic changes seen dependently within the lung bases bilaterally. No focal infiltrates or evidence for pulmonary contusion. No edema or pleural effusion. No pneumothorax. Small granuloma again noted at the left upper lobe, unchanged. No other worrisome pulmonary nodule or mass. Musculoskeletal: External soft tissues demonstrate no acute abnormality. No acute osseous abnormality. Multiple remotely healed right-sided rib fractures (approximately ribs 4 through 9). CT ABDOMEN PELVIS FINDINGS Hepatobiliary: Liver demonstrates a normal unenhanced appearance. Gallbladder within normal limits. No biliary dilatation. Pancreas: Pancreas within normal limits. Spleen: Spleen intact and normal. Adrenals/Urinary Tract: Adrenal glands within normal limits. Left kidney unremarkable without nephrolithiasis or hydronephrosis. No left-sided hydroureter. On the right, there is mild hydronephrosis involving the right kidney with associated mild perinephric fat stranding. No obstructive radiopaque calculi seen along the course of the right renal collecting system. No stones seen within the right kidney itself. No significant right-sided hydroureter. Partially distended bladder within normal limits. No layering stones within the bladder lumen. Stomach/Bowel: Sequelae of prior gastric bypass with  associated small hiatal hernia. No evidence for bowel obstruction or acute bowel injury. No acute inflammatory changes seen about the bowels. Vascular/Lymphatic: Intra-abdominal aorta of normal caliber without obvious abnormality on this noncontrast examination. No adenopathy. Reproductive: Uterus and ovaries within normal limits. Other: No free air or fluid. No mesenteric or retroperitoneal hematoma. Musculoskeletal: External soft tissues demonstrate no acute finding. No acute fracture or other osseous abnormality about the pelvis. Few scattered benign bone islands noted. No other discrete or worrisome osseous lesions. CT THORACIC SPINE FINDINGS Alignment: Physiologic with preservation of the normal thoracic kyphosis. No listhesis or subluxation. Vertebra: Vertebral body height maintained without evidence for acute or chronic fracture. No discrete or worrisome osseous lesions. Small benign bone island noted within the T3 vertebral body. Paraspinal soft tissues: Paraspinous soft tissues demonstrate no acute finding. Disc levels: Small degenerative endplate Schmorl's node noted at the superior endplate of T6. No significant disc pathology seen within the thoracic spine. No significant stenosis. CT LUMBAR SPINE FINDINGS Segmentation: Standard. Lowest well-formed disc space labeled the L5-S1 level. Alignment: Mild dextroscoliosis, apex at L3. Alignment otherwise normal preservation of the normal lumbar lordosis. No listhesis or subluxation. Vertebra: Vertebral body height maintained without evidence for acute or chronic fracture. Visualized sacrum and pelvis intact.  SI joints approximated symmetric. No worrisome osseous lesions. Paraspinal soft tissues: Paraspinous soft tissues demonstrate no acute finding. Disc levels: L1-2:  Unremarkable. L2-3: Mild disc bulge, asymmetric to the left. Mild bilateral facet hypertrophy. No significant stenosis. L3-4: Mild disc bulge. Mild to moderate facet hypertrophy. No significant  spinal stenosis. Mild bilateral foraminal narrowing. L4-5: Disc bulge with disc desiccation. Moderate facet hypertrophy. Central canal remains patent. Mild to moderate bilateral L4 foraminal stenosis, worse on the right. L5-S1: Disc bulge with disc desiccation. Moderate to severe right with mild left facet hypertrophy. No significant spinal stenosis. Severe right with moderate left L5 foraminal narrowing. IMPRESSION: CT CHEST, ABDOMEN, AND PELVIS IMPRESSION: 1. No CT evidence for acute traumatic injury within the chest, abdomen, and pelvis. 2. Mild right-sided hydronephrosis. No obstructive stone identified. Finding of uncertain etiology, and could reflect sequelae of a recently passed stone or possibly infection. Correlation with urinalysis recommended. 3. No other acute abnormality within the chest, abdomen, and pelvis. 4. Multiple remotely healed right-sided rib fractures. CT THORACIC SPINE IMPRESSION No acute traumatic injury within the thoracic spine. CT LUMBAR SPINE IMPRESSION 1. No acute traumatic injury within the lumbar spine. 2. Degenerative spondylosis at L4-5 and L5-S1 with resultant mild to moderate bilateral L4 foraminal narrowing, with more moderate to severe bilateral L5 foraminal stenosis. Electronically Signed   By: Jeannine Boga M.D.   On: 05/21/2019 23:00    Pending Labs Unresulted Labs (From admission, onward)    Start     Ordered   05/21/19 2220  Urine culture  Once,   STAT     05/21/19 2219          Vitals/Pain Today's Vitals   05/22/19 0808 05/22/19 0828 05/22/19 0834 05/22/19 1000  BP:  (!) 82/42 (!) 86/47 (!) 96/48  Pulse:  (!) 105  (!) 108  Resp:  (!) 21  18  Temp:  98.5 F (36.9 C)    TempSrc:  Oral    SpO2:  95%  96%  Weight:      PainSc: 9  8       Isolation Precautions No active isolations  Medications Medications  lidocaine (LIDODERM) 5 % 1 patch (1 patch Transdermal Patch Applied 05/22/19 0037)  celecoxib (CELEBREX) capsule 200 mg (200 mg Oral  Given 05/22/19 1003)  HYDROcodone-acetaminophen (NORCO/VICODIN) 5-325 MG per tablet 1 tablet (has no administration in time range)  traMADol (ULTRAM) tablet 50 mg (50 mg Oral Given 05/22/19 0451)  isosorbide mononitrate (IMDUR) 24 hr tablet 30 mg (30 mg Oral Given 05/22/19 1002)  hydroxychloroquine (PLAQUENIL) tablet 200 mg (200 mg Oral Given 05/22/19 1002)  DULoxetine (CYMBALTA) DR capsule 30 mg (30 mg Oral Given 05/22/19 1003)  busPIRone (BUSPAR) tablet 10 mg (10 mg Oral Given 05/22/19 1003)  LORazepam (ATIVAN) tablet 0.5 mg (has no administration in time range)  OLANZapine (ZYPREXA) tablet 15 mg (has no administration in time range)  traZODone (DESYREL) tablet 100 mg (has no administration in time range)  levothyroxine (SYNTHROID) tablet 75 mcg (75 mcg Oral Given 05/22/19 0806)  ferrous sulfate tablet 325 mg (325 mg Oral Given 05/22/19 1003)  pantoprazole (PROTONIX) EC tablet 40 mg (40 mg Oral Given [id number])  folic acid (FOLVITE) tablet 1 mg (1 mg Oral Given 05/22/19 1007)  gabapentin (NEURONTIN) capsule 400 mg (400 mg Oral Given 05/22/19 1003)  cyclobenzaprine (FLEXERIL) tablet 5 mg (has no administration in time range)  multivitamin with minerals tablet 1 tablet (1 tablet Oral Given 05/22/19 1002)  albuterol (PROVENTIL) (2.5  MG/3ML) 0.083% nebulizer solution 2.5 mg (has no administration in time range)  enoxaparin (LOVENOX) injection 40 mg (40 mg Subcutaneous Given 05/22/19 1002)  0.9 %  sodium chloride infusion ( Intravenous New Bag/Given 05/22/19 0348)  acetaminophen (TYLENOL) tablet 650 mg (650 mg Oral Given 05/22/19 0809)    Or  acetaminophen (TYLENOL) suppository 650 mg ( Rectal See Alternative 05/22/19 0809)  ondansetron (ZOFRAN) tablet 4 mg (has no administration in time range)    Or  ondansetron (ZOFRAN) injection 4 mg (has no administration in time range)  cefTRIAXone (ROCEPHIN) 2 g in sodium chloride 0.9 % 100 mL IVPB (has no administration in time range)  senna-docusate (Senokot-S)  tablet 2 tablet (2 tablets Oral Given 05/22/19 1002)  polyethylene glycol (MIRALAX / GLYCOLAX) packet 17 g (has no administration in time range)  acetaminophen (TYLENOL) tablet 1,000 mg (1,000 mg Oral Not Given 05/22/19 0329)  sodium chloride 0.9 % bolus 1,000 mL (0 mLs Intravenous Stopped 05/22/19 0131)  cefTRIAXone (ROCEPHIN) 2 g in sodium chloride 0.9 % 100 mL IVPB (0 g Intravenous Stopped 05/22/19 0125)  lactated ringers bolus 1,000 mL (0 mLs Intravenous Stopped 05/22/19 0333)  acetaminophen (TYLENOL) tablet 1,000 mg (1,000 mg Oral Given 05/22/19 0222)  sodium chloride 0.9 % bolus 1,000 mL (1,000 mLs Intravenous New Bag/Given 05/22/19 1002)    Mobility walks Low fall risk   Focused Assessments    R Recommendations: See Admitting Provider Note  Report given to: Herby Abraham, RN

## 2019-05-23 DIAGNOSIS — R7881 Bacteremia: Secondary | ICD-10-CM | POA: Diagnosis present

## 2019-05-23 DIAGNOSIS — N390 Urinary tract infection, site not specified: Secondary | ICD-10-CM | POA: Diagnosis present

## 2019-05-23 DIAGNOSIS — S2231XA Fracture of one rib, right side, initial encounter for closed fracture: Secondary | ICD-10-CM | POA: Diagnosis present

## 2019-05-23 DIAGNOSIS — R6510 Systemic inflammatory response syndrome (SIRS) of non-infectious origin without acute organ dysfunction: Secondary | ICD-10-CM | POA: Diagnosis present

## 2019-05-23 DIAGNOSIS — I10 Essential (primary) hypertension: Secondary | ICD-10-CM

## 2019-05-23 LAB — CBC
HCT: 35 % — ABNORMAL LOW (ref 36.0–46.0)
HCT: 34.3 % — ABNORMAL LOW (ref 36.0–46.0)
Hemoglobin: 11.1 g/dL — ABNORMAL LOW (ref 12.0–15.0)
Hemoglobin: 10.9 g/dL — ABNORMAL LOW (ref 12.0–15.0)
MCH: 30.1 pg (ref 26.0–34.0)
MCH: 30.5 pg (ref 26.0–34.0)
MCHC: 31.7 g/dL (ref 30.0–36.0)
MCHC: 31.8 g/dL (ref 30.0–36.0)
MCV: 94.9 fL (ref 80.0–100.0)
MCV: 96.1 fL (ref 80.0–100.0)
Platelets: 136 10*3/uL — ABNORMAL LOW (ref 150–400)
Platelets: 128 10*3/uL — ABNORMAL LOW (ref 150–400)
RBC: 3.69 MIL/uL — ABNORMAL LOW (ref 3.87–5.11)
RBC: 3.57 MIL/uL — ABNORMAL LOW (ref 3.87–5.11)
RDW: 14.7 % (ref 11.5–15.5)
RDW: 14.9 % (ref 11.5–15.5)
WBC: 21.5 10*3/uL — ABNORMAL HIGH (ref 4.0–10.5)
WBC: 20 10*3/uL — ABNORMAL HIGH (ref 4.0–10.5)
nRBC: 0 % (ref 0.0–0.2)
nRBC: 0 % (ref 0.0–0.2)

## 2019-05-23 LAB — BASIC METABOLIC PANEL
Anion gap: 9 (ref 5–15)
BUN: 19 mg/dL (ref 8–23)
CO2: 17 mmol/L — ABNORMAL LOW (ref 22–32)
Calcium: 6.9 mg/dL — ABNORMAL LOW (ref 8.9–10.3)
Chloride: 112 mmol/L — ABNORMAL HIGH (ref 98–111)
Creatinine, Ser: 0.91 mg/dL (ref 0.44–1.00)
GFR calc Af Amer: >60 mL/min (ref >60)
GFR calc non Af Amer: >60 mL/min (ref >60)
Glucose, Bld: 104 mg/dL — ABNORMAL HIGH (ref 70–99)
Potassium: 3.6 mmol/L (ref 3.5–5.1)
Sodium: 138 mmol/L (ref 135–145)

## 2019-05-23 MED ORDER — SODIUM CHLORIDE 0.9 % IV SOLN
1.0000 g | INTRAVENOUS | Status: DC
  Administered 2019-05-23 – 2019-05-24 (×2): 1 g via INTRAVENOUS
  Filled 2019-05-23: qty 1
  Filled 2019-05-23 (×2): qty 10

## 2019-05-23 MED ORDER — GABAPENTIN 400 MG PO CAPS
400.0000 mg | ORAL_CAPSULE | Freq: Two times a day (BID) | ORAL | Status: DC
  Administered 2019-05-23 – 2019-05-25 (×4): 400 mg via ORAL
  Filled 2019-05-23 (×4): qty 1

## 2019-05-23 NOTE — Progress Notes (Signed)
PROGRESS NOTE  Shelly Freeman [id number] DOB: 1950-08-06 DOA: 05/21/2019 PCP: Theotis Burrow, MD  HPI/Recap of past 53 hours: 69 year old Caucasian female with past medical history of lupus, hypertension and hypothyroidism who had had a recent fall leading to right-sided rib fractures.  On 1/14, patient sustained a second fall and was on the ground for several hours before being found by family member.  She was brought in complaining of pain in her chest and back.  She was noted to have a markedly elevated white blood cell count and UTI.  Initially she was thought to have sepsis, but this has been since ruled out.  Patient started on IV antibiotics, IV fluids and brought into the hospitalist service.  By later in the morning of 1/15, white blood cell count had decreased down to 17.1.  Urine culture still pending, but blood cultures grew out by 1/15 E. coli in 2 of 2 bottles and Enterobacter in 1 of 2 bottles.  Sensitivities pending.  Rocephin changed to [place].  Patient had a low-grade fever overnight and this morning, white blood cell count increased to 21.5.  Patient herself states that she is feeling a little bit better.  Breathing is okay.  She is tired, no other complaints  Assessment/Plan: Principal Problem:   SIRS (systemic inflammatory response syndrome) (HCC) with bacteremia of E. coli/Enterobacter secondary to acute UTI: Severe sepsis ruled out.  Continue antibiotics.  Rechecking white blood cell count this afternoon and if it continues to trend upward, will change actually back to Rocephin.  Urine cultures pending Active Problems:   Hypothyroidism: Continue Synthroid.    Essential hypertension: Continue blood pressure medications.    GERD (gastroesophageal reflux disease): Continue PPI.    Morbid obesity ([place]): Meets criteria for BMI greater than 40    Right rib fractures: Multiple secondary to fall.  Home health PT after discharge  SLE: Followed by rheumatology  as outpatient.  At this time appears stable.  Continue Plaquenil  Code Status: Full code  Family Communication: Unable to leave granddaughter voicemail.  Disposition Plan: Discharge home with home health PT once white blood cell count normalized   Consultants:  None  Procedures:  None  Antimicrobials:  IV Rocephin 1/14-1/15  IV Merrem 1/15-present   DVT prophylaxis: Lovenox   Objective: Vitals:   05/22/19 2325 05/23/19 0859  BP: (!) 102/53 (!) 105/54  Pulse: 100 99  Resp: 16 18  Temp: (!) 100.8 F (38.2 C) 98.4 F (36.9 C)  SpO2: 92% 94%    Intake/Output Summary (Last 24 hours) at 05/23/2019 1454 Last data filed at 05/23/2019 1300 Gross per 24 hour  Intake 2993.21 ml  Output 600 ml  Net 2393.21 ml   Filed Weights   05/22/19 0300  Weight: 106.4 kg   Body mass index is 44.32 kg/m.  Exam:   General: Alert and oriented x2, no acute distress  HEENT: Normocephalic and atraumatic, mucous membranes are moist  Neck: Thick, narrow airway  Cardiovascular: Regular rate and rhythm, S1-S2  Respiratory: Clear to auscultation bilaterally  Abdomen: Soft, nontender, nondistended, positive bowel sounds  Musculoskeletal: No clubbing or cyanosis, trace pitting edema  Skin: No skin breaks, tears or lesions  Psychiatry: Appropriate, no evidence of psychoses   Data Reviewed: CBC: Recent Labs  Lab 05/21/19 2133 05/22/19 0548 05/23/19 0624  WBC 27.8* 17.1* 21.5*  NEUTROABS 23.0*  --   --   HGB 12.8 12.0 11.1*  HCT 39.7 36.5 35.0*  MCV 95.0 94.1 94.9  PLT  184 149* [id number]*   Basic Metabolic Panel: Recent Labs  Lab 05/21/19 2133 05/22/19 0548 05/23/19 0624  NA 139 139 138  K 4.2 3.8 3.6  CL 108 112* 112*  CO2 22 19* 17*  GLUCOSE 135* 91 104*  BUN 29* 23 19  CREATININE 1.34* 0.90 0.91  CALCIUM 8.4* 7.6* 6.9*   GFR: Estimated Creatinine Clearance: 68.4 mL/min (by C-G formula based on SCr of 0.91 mg/dL). Liver Function Tests: Recent Labs  Lab  05/21/19 2133  AST 17  ALT 11  ALKPHOS 82  BILITOT 1.0  PROT 6.5  ALBUMIN 3.3*   No results for input(s): LIPASE, AMYLASE in the last 168 hours. No results for input(s): AMMONIA in the last 168 hours. Coagulation Profile: Recent Labs  Lab 05/21/19 2133  INR 1.1   Cardiac Enzymes: Recent Labs  Lab 05/21/19 2133  CKTOTAL 260*   BNP (last 3 results) No results for input(s): PROBNP in the last 8760 hours. HbA1C: No results for input(s): HGBA1C in the last 72 hours. CBG: No results for input(s): GLUCAP in the last 168 hours. Lipid Profile: No results for input(s): CHOL, HDL, LDLCALC, TRIG, CHOLHDL, LDLDIRECT in the last 72 hours. Thyroid Function Tests: Recent Labs    05/22/19 0548  TSH 1.763   Anemia Panel: No results for input(s): VITAMINB12, FOLATE, FERRITIN, TIBC, IRON, RETICCTPCT in the last 72 hours. Urine analysis:    Component Value Date/Time   COLORURINE YELLOW (A) 05/21/2019 2133   APPEARANCEUR CLOUDY (A) 05/21/2019 2133   LABSPEC 1.013 05/21/2019 2133   PHURINE 5.0 05/21/2019 2133   GLUCOSEU NEGATIVE 05/21/2019 2133   HGBUR NEGATIVE 05/21/2019 2133   [place] NEGATIVE 05/21/2019 2133   [place] 05/21/2019 2133   PROTEINUR 100 (A) 05/21/2019 2133   NITRITE NEGATIVE 05/21/2019 2133   LEUKOCYTESUR MODERATE (A) 05/21/2019 2133   Sepsis Labs: @LABRCNTIP (procalcitonin:4,lacticidven:4)  ) Recent Results (from the past 240 hour(s))  Respiratory Panel by RT PCR (Flu A&B, Covid) - Nasopharyngeal Swab     Status: None   Collection Time: 05/21/19 10:18 PM   Specimen: Nasopharyngeal Swab  Result Value Ref Range Status   SARS Coronavirus 2 by RT PCR NEGATIVE NEGATIVE Final    Comment: (NOTE) SARS-CoV-2 target nucleic acids are NOT DETECTED. The SARS-CoV-2 RNA is generally detectable in upper respiratoy specimens during the acute phase of infection. The lowest concentration of SARS-CoV-2 viral copies this assay can detect is 131 copies/mL. A  negative result does not preclude SARS-Cov-2 infection and should not be used as the sole basis for treatment or other patient management decisions. A negative result may occur with  improper specimen collection/handling, submission of specimen other than nasopharyngeal swab, presence of viral mutation(s) within the areas targeted by this assay, and inadequate number of viral copies (<131 copies/mL). A negative result must be combined with clinical observations, patient history, and epidemiological information. The expected result is Negative. Fact Sheet for Patients:  PinkCheek.be Fact Sheet for Healthcare Providers:  GravelBags.it This test is not yet ap proved or cleared by the [place] FDA and  has been authorized for detection and/or diagnosis of SARS-CoV-2 by FDA under an Emergency Use Authorization (EUA). This EUA will remain  in effect (meaning this test can be used) for the duration of the COVID-19 declaration under Section 564(b)(1) of the Act, 21 U.S.C. section 360bbb-3(b)(1), unless the authorization is terminated or revoked sooner.    Influenza A by PCR NEGATIVE NEGATIVE Final   Influenza B by PCR NEGATIVE NEGATIVE  Final    Comment: (NOTE) The Xpert Xpress SARS-CoV-2/FLU/RSV assay is intended as an aid in  the diagnosis of influenza from Nasopharyngeal swab specimens and  should not be used as a sole basis for treatment. Nasal washings and  aspirates are unacceptable for Xpert Xpress SARS-CoV-2/FLU/RSV  testing. Fact Sheet for Patients: PinkCheek.be Fact Sheet for Healthcare Providers: GravelBags.it This test is not yet approved or cleared by the [place] FDA and  has been authorized for detection and/or diagnosis of SARS-CoV-2 by  FDA under an Emergency Use Authorization (EUA). This EUA will remain  in effect (meaning this test can be used) for  the duration of the  Covid-19 declaration under Section 564(b)(1) of the Act, 21  U.S.C. section 360bbb-3(b)(1), unless the authorization is  terminated or revoked. Performed at [facility], [address] [place], [place] [address]   Blood culture (routine x 2)     Status: Abnormal (Preliminary result)   Collection Time: 05/21/19 11:30 PM   Specimen: BLOOD  Result Value Ref Range Status   Specimen Description   Final    BLOOD BLOOD RIGHT HAND Performed at [facility], [address], [place], [place] [address]    Special Requests   Final    BOTTLES DRAWN AEROBIC AND ANAEROBIC Blood Culture adequate volume Performed at [facility], [address], [place], [place] [address]    Culture  Setup Time   Final    GRAM NEGATIVE RODS IN BOTH AEROBIC AND ANAEROBIC BOTTLES CRITICAL RESULT CALLED TO, READ BACK BY AND VERIFIED WITH: MICHAEL SIMPSON AT 1131 05/22/19.PMF    Culture (A)  Final    ESCHERICHIA COLI SUSCEPTIBILITIES TO FOLLOW Performed at [facility], [place] [address], [place], [place] [address]    Report Status PENDING  Incomplete  Blood culture (routine x 2)     Status: None (Preliminary result)   Collection Time: 05/21/19 11:30 PM   Specimen: BLOOD  Result Value Ref Range Status   Specimen Description BLOOD BLOOD LEFT FOREARM  Final   Special Requests   Final    BOTTLES DRAWN AEROBIC AND ANAEROBIC Blood Culture adequate volume   Culture   Final    NO GROWTH 2 DAYS Performed at [facility], [address] [place], [place] [address]    Report Status PENDING  Incomplete  Blood Culture ID Panel (Reflexed)     Status: Abnormal   Collection Time: 05/21/19 11:30 PM  Result Value Ref Range Status   Enterococcus species NOT DETECTED NOT DETECTED Final   Listeria monocytogenes NOT DETECTED NOT DETECTED Final   Staphylococcus species NOT DETECTED NOT DETECTED Final   Staphylococcus aureus (BCID) NOT DETECTED NOT DETECTED Final    Streptococcus species NOT DETECTED NOT DETECTED Final   Streptococcus agalactiae NOT DETECTED NOT DETECTED Final   Streptococcus pneumoniae NOT DETECTED NOT DETECTED Final   Streptococcus pyogenes NOT DETECTED NOT DETECTED Final   Acinetobacter baumannii NOT DETECTED NOT DETECTED Final   Enterobacteriaceae species DETECTED (A) NOT DETECTED Final    Comment: Enterobacteriaceae represent a large family of gram-negative bacteria, not a single organism. CRITICAL RESULT CALLED TO, READ BACK BY AND VERIFIED WITH: MICHAEL SIMPSON AT [id number] 05/22/19.PMF    Enterobacter cloacae complex NOT DETECTED NOT DETECTED Final   Escherichia coli DETECTED (A) NOT DETECTED Final    Comment: CRITICAL RESULT CALLED TO, READ BACK BY AND VERIFIED WITH: MICHAEL SIMPSON AT [id number] 05/22/19.PMF    Klebsiella oxytoca NOT DETECTED NOT DETECTED  Final   Klebsiella pneumoniae NOT DETECTED NOT DETECTED Final   Proteus species NOT DETECTED NOT DETECTED Final   Serratia marcescens NOT DETECTED NOT DETECTED Final   Carbapenem resistance NOT DETECTED NOT DETECTED Final   Haemophilus influenzae NOT DETECTED NOT DETECTED Final   Neisseria meningitidis NOT DETECTED NOT DETECTED Final   Pseudomonas aeruginosa NOT DETECTED NOT DETECTED Final   Candida albicans NOT DETECTED NOT DETECTED Final   Candida glabrata NOT DETECTED NOT DETECTED Final   Candida krusei NOT DETECTED NOT DETECTED Final   Candida parapsilosis NOT DETECTED NOT DETECTED Final   Candida tropicalis NOT DETECTED NOT DETECTED Final    Comment: Performed at [facility], [address] [place], [place] [address]      Studies: No results found.  Scheduled Meds: . busPIRone  10 mg Oral Daily  . celecoxib  200 mg Oral Daily  . DULoxetine  30 mg Oral Daily  . enoxaparin (LOVENOX) injection  40 mg Subcutaneous BID  . ferrous sulfate  325 mg Oral Daily  . folic acid  1 mg Oral Daily  . gabapentin  400 mg Oral BID  . hydroxychloroquine  200 mg Oral BID   . isosorbide mononitrate  30 mg Oral Daily  . levothyroxine  75 mcg Oral QAC breakfast  . lidocaine  1 patch Transdermal Q24H  . LORazepam  0.5 mg Oral QHS  . multivitamin with minerals  1 tablet Oral Daily  . OLANZapine  15 mg Oral QHS  . pantoprazole  40 mg Oral BID  . polyethylene glycol  17 g Oral Daily  . senna-docusate  2 tablet Oral BID  . traZODone  100 mg Oral QHS    Continuous Infusions: . sodium chloride 75 mL/hr at 05/23/19 0942  . meropenem (MERREM) IV 1 g (05/23/19 1404)     LOS: 1 day     Annita Brod, MD [facility]  To reach me or the doctor on call, go to: [email or web address] Password [facility]  05/23/2019, 2:54 PM

## 2019-05-24 DIAGNOSIS — B9620 Unspecified Escherichia coli [E. coli] as the cause of diseases classified elsewhere: Secondary | ICD-10-CM

## 2019-05-24 DIAGNOSIS — R7881 Bacteremia: Secondary | ICD-10-CM

## 2019-05-24 DIAGNOSIS — S2241XD Multiple fractures of ribs, right side, subsequent encounter for fracture with routine healing: Secondary | ICD-10-CM

## 2019-05-24 LAB — URINE CULTURE: Culture: <10000 — AB

## 2019-05-24 LAB — CBC
HCT: 35.5 % — ABNORMAL LOW (ref 36.0–46.0)
Hemoglobin: 11.4 g/dL — ABNORMAL LOW (ref 12.0–15.0)
MCH: 30.6 pg (ref 26.0–34.0)
MCHC: 32.1 g/dL (ref 30.0–36.0)
MCV: 95.2 fL (ref 80.0–100.0)
Platelets: 142 10*3/uL — ABNORMAL LOW (ref 150–400)
RBC: 3.73 MIL/uL — ABNORMAL LOW (ref 3.87–5.11)
RDW: 15.3 % (ref 11.5–15.5)
WBC: 15.5 10*3/uL — ABNORMAL HIGH (ref 4.0–10.5)
nRBC: 0 % (ref 0.0–0.2)

## 2019-05-24 LAB — BASIC METABOLIC PANEL
Anion gap: 6 (ref 5–15)
BUN: 13 mg/dL (ref 8–23)
CO2: 21 mmol/L — ABNORMAL LOW (ref 22–32)
Calcium: 7 mg/dL — ABNORMAL LOW (ref 8.9–10.3)
Chloride: 111 mmol/L (ref 98–111)
Creatinine, Ser: 0.63 mg/dL (ref 0.44–1.00)
GFR calc Af Amer: >60 mL/min (ref >60)
GFR calc non Af Amer: >60 mL/min (ref >60)
Glucose, Bld: 89 mg/dL (ref 70–99)
Potassium: 3.4 mmol/L — ABNORMAL LOW (ref 3.5–5.1)
Sodium: 138 mmol/L (ref 135–145)

## 2019-05-24 LAB — CULTURE, BLOOD (ROUTINE X 2): Special Requests: ADEQUATE

## 2019-05-24 MED ORDER — POTASSIUM CHLORIDE CRYS ER 20 MEQ PO TBCR
20.0000 meq | EXTENDED_RELEASE_TABLET | Freq: Once | ORAL | Status: AC
  Administered 2019-05-24: 20 meq via ORAL
  Filled 2019-05-24: qty 1

## 2019-05-24 NOTE — Progress Notes (Signed)
Pt was able to sit in the chair for 2 hours with assistance x1.

## 2019-05-24 NOTE — Progress Notes (Addendum)
PROGRESS NOTE  Shelly Freeman [id number] DOB: 1950-08-09 DOA: 05/21/2019 PCP: Theotis Burrow, MD  HPI/Recap of past 73 hours: 69 year old Caucasian female with past medical history of lupus, hypertension and hypothyroidism who had had a recent fall leading to right-sided rib fractures.  On 1/14, patient sustained a second fall and was on the ground for several hours before being found by family member.  She was brought in complaining of pain in her chest and back.  She was noted to have a markedly elevated white blood cell count and UTI.  Initially she was thought to have sepsis, but this has been since ruled out.  Patient started on IV antibiotics, IV fluids and brought into the hospitalist service.  By later in the morning of 1/15, white blood cell count had decreased down to 17.1.  Urine culture still pending, but blood cultures grew out by 1/15 pansensitive E. coli and also possibly Enterococcus.  Rocephin changed to [place].  Patient had a low-grade fever overnight and this morning, white blood cell count increased to 21.5, with little change by that evening, so changed back to Rocephin.  This morning, patient complains of some pain on the right side of her chest, worse with a deep inspiration.  No real cough or shortness of breath though.  Assessment/Plan: Principal Problem:   SIRS (systemic inflammatory response syndrome) (HCC) with bacteremia of E. coli/Enterobacter secondary to acute UTI: Severe sepsis ruled out.  Continue antibiotics.  With change back to Rocephin, white count trending back downward.  Urine culture unrevealing.  Active Problems:   Hypothyroidism: Continue Synthroid.    Essential hypertension: Continue blood pressure medications.    GERD (gastroesophageal reflux disease): Continue PPI.    Morbid obesity ([place]): Meets criteria for BMI greater than 40    Right rib fractures: Multiple secondary to fall.  Home health PT after discharge.  Her pain is likely  due to her rib fractures.  We will add incentive spirometry and spent time out of bed.  SLE: Followed by rheumatology as outpatient.  At this time appears stable.  Continue Plaquenil  Code Status: Full code  Family Communication: Updated granddaughter by phone  Disposition Plan: Discharge home with home health PT once white blood cell count normalized.  Potentially tomorrow   Consultants:  None  Procedures:  None  Antimicrobials:  IV Rocephin 1/14-1/15, restarted 1/16  IV Merrem 1/15-1/16  DVT prophylaxis: Lovenox   Objective: Vitals:   05/23/19 2352 05/24/19 0734  BP: 111/64 122/60  Pulse: 100 100  Resp: 16   Temp: 98.7 F (37.1 C) 98.2 F (36.8 C)  SpO2: 93% 94%    Intake/Output Summary (Last 24 hours) at 05/24/2019 1048 Last data filed at 05/23/2019 1900 Gross per 24 hour  Intake 1179.75 ml  Output 300 ml  Net 879.75 ml   Filed Weights   05/22/19 0300  Weight: 106.4 kg   Body mass index is 44.32 kg/m.  Exam:   General: Alert and oriented x2, mild acute distress secondary to pain on her right side  HEENT: Normocephalic and atraumatic, mucous membranes are moist  Neck: Thick, narrow airway  Cardiovascular: Regular rate and rhythm, S1-S2  Respiratory: Clear to auscultation bilaterally, moderate inspiratory effort  Abdomen: Soft, nontender, nondistended, positive bowel sounds  Musculoskeletal: No clubbing or cyanosis, trace pitting edema  Skin: No skin breaks, tears or lesions  Psychiatry: Appropriate, no evidence of psychoses   Data Reviewed: CBC: Recent Labs  Lab 05/21/19 2133 05/22/19 0548 05/23/19 0624 05/23/19  1532 05/24/19 0430  WBC 27.8* 17.1* 21.5* 20.0* 15.5*  NEUTROABS 23.0*  --   --   --   --   HGB 12.8 12.0 11.1* 10.9* 11.4*  HCT 39.7 36.5 35.0* 34.3* 35.5*  MCV 95.0 94.1 94.9 96.1 95.2  PLT 184 149* 136* 128* [id number]*   Basic Metabolic Panel: Recent Labs  Lab 05/21/19 2133 05/22/19 0548 05/23/19 0624  05/24/19 0430  NA 139 139 138 138  K 4.2 3.8 3.6 3.4*  CL 108 112* 112* 111  CO2 22 19* 17* 21*  GLUCOSE 135* 91 104* 89  BUN 29* 23 19 13   CREATININE 1.34* 0.90 0.91 0.63  CALCIUM 8.4* 7.6* 6.9* 7.0*   GFR: Estimated Creatinine Clearance: 76.7 mL/min (by C-G formula based on SCr of 0.63 mg/dL). Liver Function Tests: Recent Labs  Lab 05/21/19 2133  AST 17  ALT 11  ALKPHOS 82  BILITOT 1.0  PROT 6.5  ALBUMIN 3.3*   No results for input(s): LIPASE, AMYLASE in the last 168 hours. No results for input(s): AMMONIA in the last 168 hours. Coagulation Profile: Recent Labs  Lab 05/21/19 2133  INR 1.1   Cardiac Enzymes: Recent Labs  Lab 05/21/19 2133  CKTOTAL 260*   BNP (last 3 results) No results for input(s): PROBNP in the last 8760 hours. HbA1C: No results for input(s): HGBA1C in the last 72 hours. CBG: No results for input(s): GLUCAP in the last 168 hours. Lipid Profile: No results for input(s): CHOL, HDL, LDLCALC, TRIG, CHOLHDL, LDLDIRECT in the last 72 hours. Thyroid Function Tests: Recent Labs    05/22/19 0548  TSH 1.763   Anemia Panel: No results for input(s): VITAMINB12, FOLATE, FERRITIN, TIBC, IRON, RETICCTPCT in the last 72 hours. Urine analysis:    Component Value Date/Time   COLORURINE YELLOW (A) 05/21/2019 2133   APPEARANCEUR CLOUDY (A) 05/21/2019 2133   LABSPEC 1.013 05/21/2019 2133   PHURINE 5.0 05/21/2019 2133   GLUCOSEU NEGATIVE 05/21/2019 2133   HGBUR NEGATIVE 05/21/2019 2133   Sunrise NEGATIVE 05/21/2019 2133   [place]ock 05/21/2019 2133   PROTEINUR 100 (A) 05/21/2019 2133   NITRITE NEGATIVE 05/21/2019 2133   LEUKOCYTESUR MODERATE (A) 05/21/2019 2133   Sepsis Labs: @LABRCNTIP (procalcitonin:4,lacticidven:4)  ) Recent Results (from the past 240 hour(s))  Respiratory Panel by RT PCR (Flu A&B, Covid) - Nasopharyngeal Swab     Status: None   Collection Time: 05/21/19 10:18 PM   Specimen: Nasopharyngeal Swab  Result Value Ref  Range Status   SARS Coronavirus 2 by RT PCR NEGATIVE NEGATIVE Final    Comment: (NOTE) SARS-CoV-2 target nucleic acids are NOT DETECTED. The SARS-CoV-2 RNA is generally detectable in upper respiratoy specimens during the acute phase of infection. The lowest concentration of SARS-CoV-2 viral copies this assay can detect is 131 copies/mL. A negative result does not preclude SARS-Cov-2 infection and should not be used as the sole basis for treatment or other patient management decisions. A negative result may occur with  improper specimen collection/handling, submission of specimen other than nasopharyngeal swab, presence of viral mutation(s) within the areas targeted by this assay, and inadequate number of viral copies (<131 copies/mL). A negative result must be combined with clinical observations, patient history, and epidemiological information. The expected result is Negative. Fact Sheet for Patients:  PinkCheek.be Fact Sheet for Healthcare Providers:  GravelBags.it This test is not yet ap proved or cleared by the [place] FDA and  has been authorized for detection and/or diagnosis of SARS-CoV-2 by FDA under an  Emergency Use Authorization (EUA). This EUA will remain  in effect (meaning this test can be used) for the duration of the COVID-19 declaration under Section 564(b)(1) of the Act, 21 U.S.C. section 360bbb-3(b)(1), unless the authorization is terminated or revoked sooner.    Influenza A by PCR NEGATIVE NEGATIVE Final   Influenza B by PCR NEGATIVE NEGATIVE Final    Comment: (NOTE) The Xpert Xpress SARS-CoV-2/FLU/RSV assay is intended as an aid in  the diagnosis of influenza from Nasopharyngeal swab specimens and  should not be used as a sole basis for treatment. Nasal washings and  aspirates are unacceptable for Xpert Xpress SARS-CoV-2/FLU/RSV  testing. Fact Sheet for  Patients: PinkCheek.be Fact Sheet for Healthcare Providers: GravelBags.it This test is not yet approved or cleared by the [place] FDA and  has been authorized for detection and/or diagnosis of SARS-CoV-2 by  FDA under an Emergency Use Authorization (EUA). This EUA will remain  in effect (meaning this test can be used) for the duration of the  Covid-19 declaration under Section 564(b)(1) of the Act, 21  U.S.C. section 360bbb-3(b)(1), unless the authorization is  terminated or revoked. Performed at [facility] [address] [place], [place] [address]   Blood culture (routine x 2)     Status: Abnormal   Collection Time: 05/21/19 11:30 PM   Specimen: BLOOD  Result Value Ref Range Status   Specimen Description   Final    BLOOD BLOOD RIGHT HAND Performed at [facility], [address] [place], [place] [address]    Special Requests   Final    BOTTLES DRAWN AEROBIC AND ANAEROBIC Blood Culture adequate volume Performed at [facility], [address], [place], [place] [address]    Culture  Setup Time   Final    GRAM NEGATIVE RODS IN BOTH AEROBIC AND ANAEROBIC BOTTLES CRITICAL RESULT CALLED TO, READ BACK BY AND VERIFIED WITH: MICHAEL SIMPSON AT [id number] 05/22/19.PMF Performed at [facility], [place] [address] [place], [place] [address]    Culture ESCHERICHIA COLI (A)  Final   Report Status 05/24/2019 FINAL  Final   Organism ID, Bacteria ESCHERICHIA COLI  Final      Susceptibility   Escherichia coli - MIC*    AMPICILLIN <=2 SENSITIVE Sensitive     CEFAZOLIN <=4 SENSITIVE Sensitive     CEFEPIME <=0.12 SENSITIVE Sensitive     CEFTAZIDIME <=1 SENSITIVE Sensitive     CEFTRIAXONE <=0.25 SENSITIVE Sensitive     CIPROFLOXACIN <=0.25 SENSITIVE Sensitive     GENTAMICIN <=1 SENSITIVE Sensitive     IMIPENEM <=0.25 SENSITIVE Sensitive     TRIMETH/SULFA <=20 SENSITIVE Sensitive      AMPICILLIN/SULBACTAM <=2 SENSITIVE Sensitive     PIP/TAZO <=4 SENSITIVE Sensitive     * ESCHERICHIA COLI  Blood culture (routine x 2)     Status: None (Preliminary result)   Collection Time: 05/21/19 11:30 PM   Specimen: BLOOD  Result Value Ref Range Status   Specimen Description BLOOD BLOOD LEFT FOREARM  Final   Special Requests   Final    BOTTLES DRAWN AEROBIC AND ANAEROBIC Blood Culture adequate volume   Culture   Final    NO GROWTH 3 DAYS Performed at [facility], [address] [place], [place] [address]    Report Status PENDING  Incomplete  Blood Culture ID Panel (Reflexed)     Status: Abnormal   Collection Time: 05/21/19 11:30 PM  Result Value Ref Range Status   Enterococcus  species NOT DETECTED NOT DETECTED Final   Listeria monocytogenes NOT DETECTED NOT DETECTED Final   Staphylococcus species NOT DETECTED NOT DETECTED Final   Staphylococcus aureus (BCID) NOT DETECTED NOT DETECTED Final   Streptococcus species NOT DETECTED NOT DETECTED Final   Streptococcus agalactiae NOT DETECTED NOT DETECTED Final   Streptococcus pneumoniae NOT DETECTED NOT DETECTED Final   Streptococcus pyogenes NOT DETECTED NOT DETECTED Final   Acinetobacter baumannii NOT DETECTED NOT DETECTED Final   Enterobacteriaceae species DETECTED (A) NOT DETECTED Final    Comment: Enterobacteriaceae represent a large family of gram-negative bacteria, not a single organism. CRITICAL RESULT CALLED TO, READ BACK BY AND VERIFIED WITH: MICHAEL SIMPSON AT [id number] 05/22/19.PMF    Enterobacter cloacae complex NOT DETECTED NOT DETECTED Final   Escherichia coli DETECTED (A) NOT DETECTED Final    Comment: CRITICAL RESULT CALLED TO, READ BACK BY AND VERIFIED WITH: MICHAEL SIMPSON AT [id number] 05/22/19.PMF    Klebsiella oxytoca NOT DETECTED NOT DETECTED Final   Klebsiella pneumoniae NOT DETECTED NOT DETECTED Final   Proteus species NOT DETECTED NOT DETECTED Final   Serratia marcescens NOT DETECTED NOT DETECTED Final    Carbapenem resistance NOT DETECTED NOT DETECTED Final   Haemophilus influenzae NOT DETECTED NOT DETECTED Final   Neisseria meningitidis NOT DETECTED NOT DETECTED Final   Pseudomonas aeruginosa NOT DETECTED NOT DETECTED Final   Candida albicans NOT DETECTED NOT DETECTED Final   Candida glabrata NOT DETECTED NOT DETECTED Final   Candida krusei NOT DETECTED NOT DETECTED Final   Candida parapsilosis NOT DETECTED NOT DETECTED Final   Candida tropicalis NOT DETECTED NOT DETECTED Final    Comment: Performed at [facility], [address], [place], [place] [address]  Urine culture     Status: Abnormal   Collection Time: 05/22/19 11:25 PM   Specimen: Urine, Random  Result Value Ref Range Status   Specimen Description   Final    URINE, RANDOM Performed at [facility], [address] [place], [place] [address]    Special Requests   Final    NONE Performed at [facility], [address] [place], [place] [address]    Culture (A)  Final    <10,000 COLONIES/mL INSIGNIFICANT GROWTH Performed at [place] [address] [place], [place] [address]    Report Status 05/24/2019 FINAL  Final      Studies: No results found.  Scheduled Meds: . busPIRone  10 mg Oral Daily  . celecoxib  200 mg Oral Daily  . DULoxetine  30 mg Oral Daily  . enoxaparin (LOVENOX) injection  40 mg Subcutaneous BID  . ferrous sulfate  325 mg Oral Daily  . folic acid  1 mg Oral Daily  . gabapentin  400 mg Oral BID  . hydroxychloroquine  200 mg Oral BID  . isosorbide mononitrate  30 mg Oral Daily  . levothyroxine  75 mcg Oral QAC breakfast  . lidocaine  1 patch Transdermal Q24H  . LORazepam  0.5 mg Oral QHS  . multivitamin with minerals  1 tablet Oral Daily  . OLANZapine  15 mg Oral QHS  . pantoprazole  40 mg Oral BID  . polyethylene glycol  17 g Oral Daily  . potassium chloride  20 mEq Oral Once  . senna-docusate  2 tablet Oral BID  . traZODone  100 mg Oral QHS     Continuous Infusions: . cefTRIAXone (ROCEPHIN)  IV 1 g (05/23/19 2155)     LOS: 2 days  Annita Brod, MD [facility]  To reach me or the doctor on call, go to: [email or web address] Password [facility]  05/24/2019, 10:48 AM

## 2019-05-25 LAB — BASIC METABOLIC PANEL
Anion gap: 8 (ref 5–15)
BUN: 11 mg/dL (ref 8–23)
CO2: 22 mmol/L (ref 22–32)
Calcium: 8.1 mg/dL — ABNORMAL LOW (ref 8.9–10.3)
Chloride: 110 mmol/L (ref 98–111)
Creatinine, Ser: 0.67 mg/dL (ref 0.44–1.00)
GFR calc Af Amer: >60 mL/min (ref >60)
GFR calc non Af Amer: >60 mL/min (ref >60)
Glucose, Bld: 92 mg/dL (ref 70–99)
Potassium: 4 mmol/L (ref 3.5–5.1)
Sodium: 140 mmol/L (ref 135–145)

## 2019-05-25 LAB — CBC
HCT: 37.1 % (ref 36.0–46.0)
Hemoglobin: 12.1 g/dL (ref 12.0–15.0)
MCH: 30.4 pg (ref 26.0–34.0)
MCHC: 32.6 g/dL (ref 30.0–36.0)
MCV: 93.2 fL (ref 80.0–100.0)
Platelets: 154 10*3/uL (ref 150–400)
RBC: 3.98 MIL/uL (ref 3.87–5.11)
RDW: 15.4 % (ref 11.5–15.5)
WBC: 8.9 10*3/uL (ref 4.0–10.5)
nRBC: 0 % (ref 0.0–0.2)

## 2019-05-25 MED ORDER — SODIUM CHLORIDE 0.9 % IV SOLN
2.0000 g | INTRAVENOUS | Status: DC
  Administered 2019-05-25: 2 g via INTRAVENOUS
  Filled 2019-05-25: qty 2
  Filled 2019-05-25: qty 20

## 2019-05-25 MED ORDER — CEFPODOXIME PROXETIL 200 MG PO TABS: 200.0000 mg | ORAL_TABLET | Freq: Two times a day (BID) | ORAL | 0 refills | Status: DC

## 2019-05-25 NOTE — TOC Transition Note (Signed)
Transition of Care [facility]) - CM/SW Discharge Note   Patient Details  Name: Shelly Freeman MRN: [id number] Date of Birth: [birth year]  Transition of [facility]) CM/SW Contact:  Su Hilt, RN Phone Number: 05/25/2019, 10:49 AM   Clinical Narrative:     Patient to Discharge home with Home health thru [facility], NO DME needed,   Final next level of care: [place] Barriers to Discharge: Continued Medical Work up   Patient Goals and CMS Choice Patient states their goals for this hospitalization and ongoing recovery are:: Wants to feel better and pain better.      Discharge Placement                       Discharge Plan and Services In-house Referral: Clinical Social Work   Post Acute Care Choice: Home Health                    HH Arranged: PT [facility] Agency: [place] ([facility]) Date [place]: 05/22/19 Time [place]: [place] [facility] Representative spoke with at [place]: [place] ([place]) Interventions     Readmission Risk Interventions Readmission Risk Prevention Plan 01/30/2019  Post Dischage Appt Complete  Medication Screening Complete  Transportation Screening Complete  Some recent data might be hidden

## 2019-05-25 NOTE — Discharge Summary (Signed)
Discharge Summary  Shelly Freeman [id number] DOB: 1951/03/11  PCP: Theotis Burrow, MD  Admit date: 05/21/2019 Discharge date: 05/25/2019  Time spent: 25 minutes   Recommendations for Outpatient Follow-up:  1. New medication: Vantin 200 mg p.o. twice daily x2 days 2. Patient will go home with home health PT 3. Patient will follow up with her PCP in the next 1 month  Discharge Diagnoses:  Active Hospital Problems   Diagnosis Date Noted  . Bacteremia due to Gram-negative bacteria 05/23/2019  . Morbid obesity ([place]) 05/23/2019  . Acute lower UTI 05/23/2019  . Right rib fracture 05/23/2019  . SIRS (systemic inflammatory response syndrome) ([place]) 05/23/2019  . GERD (gastroesophageal reflux disease) 04/04/2016  . Hypothyroidism 04/03/2016  . Essential hypertension 04/03/2016    Resolved Hospital Problems  No resolved problems to display.    Discharge Condition: Improved, being discharged home  Diet recommendation: Heart healthy  Vitals:   05/24/19 2333 05/25/19 0849  BP: (!) 140/56 127/72  Pulse: 90 (!) 102  Resp:  18  Temp: 98.1 F (36.7 C) 97.8 F (36.6 C)  SpO2: 95% 99%    History of present illness:  69 year old Caucasian female with past medical history of lupus, hypertension and hypothyroidism who had had a recent fall leading to right-sided rib fractures.  On 1/14, patient sustained a second fall and was on the ground for several hours before being found by family member.  She was brought in complaining of pain in her chest and back.  She was noted to have a markedly elevated white blood cell count and UTI.  Initially she was thought to have sepsis, but this has been since ruled out.  Patient started on IV antibiotics, IV fluids and brought into the hospitalist service.   Hospital Course:   SIRS (systemic inflammatory response syndrome) (HCC) with bacteremia of E. coli/Enterobacter secondary to acute UTI: Severe sepsis ruled out.  By later in the morning  of 1/15, white blood cell count had decreased down to 17.1.  Urine culture still pending, but blood cultures grew out by 1/15 pansensitive E. coli and also possibly Enterococcus.  Rocephin changed to [place].  Patient had a low-grade fever overnight and this morning, white blood cell count increased to 21.5, with little change by that evening, so changed back to Rocephin.  By 1/18, white count normalized.  Patient has been afebrile and vitals stable.  Able to discharge home with 2 more days of p.o. antibioticsActive Problems:   Hypothyroidism: Continue Synthroid.    Essential hypertension: Continue blood pressure medications.    GERD (gastroesophageal reflux disease): Continue PPI.    Morbid obesity ([place]): Meets criteria for BMI greater than 40    Right rib fractures: Multiple secondary to fall.  Home health PT set up for discharge.  Her pain is likely due to her rib fractures.  We will add incentive spirometry and spent time out of bed.  SLE: Followed by rheumatology as outpatient.  At this time appears stable.  Continue Plaquenil  Procedures:  None  Consultations:  None  Discharge Exam: BP 127/72 (BP Location: Right Arm)   Pulse (!) 102   Temp 97.8 F (36.6 C) (Oral)   Resp 18   Wt 106.4 kg   SpO2 99%   BMI 44.32 kg/m   General: Alert and oriented x3, no acute distress Cardiovascular: Regular rate and rhythm, S1-S2 Respiratory: Clear to auscultation bilaterally  Discharge Instructions You were cared for by a hospitalist during your hospital stay. If you  have any questions about your discharge medications or the care you received while you were in the hospital after you are discharged, you can call the unit and asked to speak with the hospitalist on call if the hospitalist that took care of you is not available. Once you are discharged, your primary care physician will handle any further medical issues. Please note that NO REFILLS for any discharge medications will be  authorized once you are discharged, as it is imperative that you return to your primary care physician (or establish a relationship with a primary care physician if you do not have one) for your aftercare needs so that they can reassess your need for medications and monitor your lab values.  Discharge Instructions    Diet - low sodium heart healthy   Complete by: As directed    Increase activity slowly   Complete by: As directed      Allergies as of 05/25/2019   No Known Allergies     Medication List    TAKE these medications   acetaminophen 325 MG tablet Commonly known as: TYLENOL Take 650 mg by mouth every 6 (six) hours as needed.   albuterol 108 (90 Base) MCG/ACT inhaler Commonly known as: VENTOLIN HFA Inhale 2 puffs into the lungs every 6 (six) hours as needed for wheezing or shortness of breath. Notes to patient: Not given during this hospital stay   alendronate 70 MG tablet Commonly known as: FOSAMAX Take 70 mg by mouth once a week. Take with a full glass of water on an empty stomach. Notes to patient: Not given during this hospital stay   B COMPLEX PO Take 1 tablet by mouth daily. Notes to patient: Not given during this hospital stay   busPIRone 10 MG tablet Commonly known as: BUSPAR Take 10 mg by mouth daily.   cefpodoxime 200 MG tablet Commonly known as: VANTIN Take 1 tablet (200 mg total) by mouth 2 (two) times daily.   celecoxib 200 MG capsule Commonly known as: CELEBREX Take 200 mg by mouth daily.   cyclobenzaprine 5 MG tablet Commonly known as: FLEXERIL Take 5 mg by mouth at bedtime as needed. Notes to patient: Not given during this hospital stay   diclofenac Sodium 1 % Gel Commonly known as: VOLTAREN Apply 2 g topically 4 (four) times daily. Notes to patient: Not given during this hospital stay   docusate sodium 100 MG capsule Commonly known as: COLACE Take 1 capsule (100 mg total) by mouth 2 (two) times daily.   DULoxetine 60 MG  capsule Commonly known as: CYMBALTA Take 1 capsule (60 mg total) by mouth daily. What changed: how much to take   famotidine 20 MG tablet Commonly known as: PEPCID Take 1 tablet (20 mg total) by mouth 2 (two) times daily. What changed: when to take this Notes to patient: Not given during this hospital stay   ferrous sulfate 325 (65 FE) MG tablet Take 325 mg by mouth daily.   folic acid 1 MG tablet Commonly known as: FOLVITE Take 1 mg by mouth daily.   gabapentin 800 MG tablet Commonly known as: NEURONTIN Take 800 mg by mouth 2 (two) times daily.   HYDROcodone-acetaminophen 5-325 MG tablet Commonly known as: NORCO/VICODIN Take 1 tablet by mouth 2 (two) times daily as needed.   hydroxychloroquine 200 MG tablet Commonly known as: Plaquenil Take 2 tablets (400 mg total) by mouth daily. What changed:   how much to take  when to take this  ibuprofen 200 MG tablet Commonly known as: ADVIL Take 400-800 mg by mouth every 6 (six) hours as needed for fever, mild pain or moderate pain. Notes to patient: Not given during this hospital stay   ipratropium-albuterol 0.5-2.5 (3) MG/3ML Soln Commonly known as: DUONEB Take 3 mLs by nebulization every 6 (six) hours as needed (Wheezing). Notes to patient: Not given during this hospital stay   isosorbide mononitrate 30 MG 24 hr tablet Commonly known as: IMDUR Take 30 mg by mouth daily.   levothyroxine 75 MCG tablet Commonly known as: SYNTHROID Take 75 mcg by mouth daily before breakfast.   LORazepam 0.5 MG tablet Commonly known as: ATIVAN Take 0.5 mg by mouth at bedtime.   metoprolol succinate 25 MG 24 hr tablet Commonly known as: TOPROL-XL Take 25 mg by mouth daily.   OLANZapine 15 MG tablet Commonly known as: ZYPREXA Take 1 tablet (15 mg total) by mouth at bedtime.   pantoprazole 40 MG tablet Commonly known as: PROTONIX Take 40 mg by mouth 2 (two) times daily.   traMADol 50 MG tablet Commonly known as: ULTRAM Take  50 mg by mouth every 6 (six) hours as needed.   traZODone 100 MG tablet Commonly known as: DESYREL Take 100 mg by mouth at bedtime.        No Known Allergies Follow-up Information    Revelo, Elyse Jarvis, MD In 1 month.   Specialty: Family Medicine Why: Clinic is Closed today for [place];  Patient to call for Appt  Contact information: [address] [place] [address] [phone]            The results of significant diagnostics from this hospitalization (including imaging, microbiology, ancillary and laboratory) are listed below for reference.    Significant Diagnostic Studies: CT ABDOMEN PELVIS WO CONTRAST  Result Date: 05/21/2019 CLINICAL DATA:  Initial evaluation for acute rib pain status post fall. History of recent rib fractures. EXAM: CT CHEST, ABDOMEN AND PELVIS WITHOUT CONTRAST TECHNIQUE: Multidetector CT imaging of the chest, abdomen and pelvis was performed following the standard protocol without IV contrast. COMPARISON:  Comparison made with prior CT from 01/29/2019. FINDINGS: CT CHEST FINDINGS Cardiovascular: Limited noncontrast evaluation of the intrathoracic aorta is unremarkable without aneurysm or definite acute abnormality. Mild atherosclerotic change noted within the aortic arch. Partially visualized great vessels within normal limits. Heart size within normal limits. No pericardial effusion. Scattered coronary artery calcifications noted. Main pulmonary artery dilated up to 3.7 cm, suggesting possible underlying pulmonary hypertension. Mediastinum/Nodes: No pathologically enlarged mediastinal, hilar, or axillary lymph nodes. No mediastinal hematoma or mass. Esophagus demonstrates no acute finding. Small hiatal hernia noted. Lungs/Pleura: Tracheobronchial tree intact and patent. Lungs well inflated. Scattered linear atelectatic changes seen dependently within the lung bases bilaterally. No focal infiltrates or evidence for pulmonary contusion. No  edema or pleural effusion. No pneumothorax. Small granuloma again noted at the left upper lobe, unchanged. No other worrisome pulmonary nodule or mass. Musculoskeletal: External soft tissues demonstrate no acute abnormality. No acute osseous abnormality. Multiple remotely healed right-sided rib fractures (approximately ribs 4 through 9). CT ABDOMEN PELVIS FINDINGS Hepatobiliary: Liver demonstrates a normal unenhanced appearance. Gallbladder within normal limits. No biliary dilatation. Pancreas: Pancreas within normal limits. Spleen: Spleen intact and normal. Adrenals/Urinary Tract: Adrenal glands within normal limits. Left kidney unremarkable without nephrolithiasis or hydronephrosis. No left-sided hydroureter. On the right, there is mild hydronephrosis involving the right kidney with associated mild perinephric fat stranding. No obstructive radiopaque calculi seen along the course  of the right renal collecting system. No stones seen within the right kidney itself. No significant right-sided hydroureter. Partially distended bladder within normal limits. No layering stones within the bladder lumen. 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 No evidence for bowel obstruction or acute bowel injury. No acute inflammatory changes seen about the bowels. Vascular/Lymphatic: Intra-abdominal aorta of normal caliber without obvious abnormality on this noncontrast examination. No adenopathy. Reproductive: Uterus and ovaries within normal limits. Other: No free air or fluid. No mesenteric or retroperitoneal hematoma. Musculoskeletal: External soft tissues demonstrate no acute finding. No acute fracture or other osseous abnormality about the pelvis. Few scattered benign bone islands noted. No other discrete or worrisome osseous lesions. CT THORACIC SPINE FINDINGS Alignment: Physiologic with preservation of the normal thoracic kyphosis. No listhesis or subluxation. Vertebra: Vertebral  body height maintained without evidence for acute or chronic fracture. No discrete or worrisome osseous lesions. Small benign bone island noted within the T3 vertebral body. Paraspinal soft tissues: Paraspinous soft tissues demonstrate no acute finding. Disc levels: Small degenerative endplate Schmorl's node noted at the superior endplate of T6. No significant disc pathology seen within the thoracic spine. No significant stenosis. CT LUMBAR SPINE FINDINGS Segmentation: Standard. Lowest well-formed disc space labeled the L5-S1 level. Alignment: Mild dextroscoliosis, apex at L3. Alignment otherwise normal preservation of the normal lumbar lordosis. No listhesis or subluxation. Vertebra: Vertebral body height maintained without evidence for acute or chronic fracture. Visualized sacrum and pelvis intact. SI joints approximated symmetric. No worrisome osseous lesions. Paraspinal soft tissues: Paraspinous soft tissues demonstrate no acute finding. Disc levels: L1-2:  Unremarkable. L2-3: Mild disc bulge, asymmetric to the left. Mild bilateral facet hypertrophy. No significant stenosis. L3-4: Mild disc bulge. Mild to moderate facet hypertrophy. No significant spinal stenosis. Mild bilateral foraminal narrowing. L4-5: Disc bulge with disc desiccation. Moderate facet hypertrophy. Central canal remains patent. Mild to moderate bilateral L4 foraminal stenosis, worse on the right. L5-S1: Disc bulge with disc desiccation. Moderate to severe right with mild left facet hypertrophy. No significant spinal stenosis. Severe right with moderate left L5 foraminal narrowing. IMPRESSION: CT CHEST, ABDOMEN, AND PELVIS IMPRESSION: 1. No CT evidence for acute traumatic injury within the chest, abdomen, and pelvis. 2. Mild right-sided hydronephrosis. No obstructive stone identified. Finding of uncertain etiology, and could reflect sequelae of a recently passed stone or possibly infection. Correlation with urinalysis recommended. 3. No other  acute abnormality within the chest, abdomen, and pelvis. 4. Multiple remotely healed right-sided rib fractures. CT THORACIC SPINE IMPRESSION No acute traumatic injury within the thoracic spine. CT LUMBAR SPINE IMPRESSION 1. No acute traumatic injury within the lumbar spine. 2. Degenerative spondylosis at L4-5 and L5-S1 with resultant mild to moderate bilateral L4 foraminal narrowing, with more moderate to severe bilateral L5 foraminal stenosis. Electronically Signed   By: Jeannine Boga M.D.   On: 05/21/2019 23:00   CT Head Wo Contrast  Result Date: 05/21/2019 CLINICAL DATA:  [place] from bed EXAM: CT HEAD WITHOUT CONTRAST CT CERVICAL SPINE WITHOUT CONTRAST TECHNIQUE: Multidetector CT imaging of the head and cervical spine was performed following the standard protocol without intravenous contrast. Multiplanar CT image reconstructions of the cervical spine were also generated. COMPARISON:  CT head 04/15/2017 FINDINGS: CT HEAD FINDINGS Brain: No evidence of acute infarction, hemorrhage, hydrocephalus, extra-axial collection or mass lesion/mass effect. Symmetric prominence of the ventricles, cisterns and sulci compatible with parenchymal volume loss. Mixed patchy and confluent areas of white matter hypoattenuation are most compatible with moderate chronic  microvascular angiopathy, overall extent of which is similar to comparison. Vascular: Atherosclerotic calcification of the carotid siphons and intradural vertebral arteries. No hyperdense vessel. Skull: There is some mild left frontal scalp swelling without large hematoma or subjacent calvarial fracture. Benign hyperostosis frontalis interna is incidentally noted. Sinuses/Orbits: Paranasal sinuses and mastoid air cells are predominantly clear. Included orbital structures are unremarkable. Other: None CT CERVICAL SPINE FINDINGS Alignment: Preservation of the normal cervical lordosis without traumatic listhesis. No abnormal facet widening. Normal alignment of the  craniocervical and atlantoaxial articulations. Skull base and vertebrae: No acute fracture. No primary bone lesion or focal pathologic process. Soft tissues and spinal canal: No pre or paravertebral fluid or swelling. No visible canal hematoma. Disc levels: Minimal diffuse intervertebral disc height loss with early spondylitic endplate changes, maximal C6-7. no significant neural foraminal stenosis or spinal canal narrowing. Upper chest: No acute abnormality in the upper chest or imaged lung apices. Dedicated CT of the chest was performed concurrently. Other: Atherosclerotic calcification of the cervical carotid arteries. IMPRESSION: 1. Mild left frontal scalp swelling without large hematoma or subjacent calvarial fracture. 2. No acute intracranial abnormality. 3. Stable parenchymal volume loss and chronic microvascular ischemic white matter disease. 4. No acute cervical spine fracture or traumatic listhesis. 5. Minimal cervical spondylitic changes. Electronically Signed   By: Lovena Le M.D.   On: 05/21/2019 21:52   CT Chest Wo Contrast  Result Date: 05/21/2019 CLINICAL DATA:  Initial evaluation for acute rib pain status post fall. History of recent rib fractures. EXAM: CT CHEST, ABDOMEN AND PELVIS WITHOUT CONTRAST TECHNIQUE: Multidetector CT imaging of the chest, abdomen and pelvis was performed following the standard protocol without IV contrast. COMPARISON:  Comparison made with prior CT from 01/29/2019. FINDINGS: CT CHEST FINDINGS Cardiovascular: Limited noncontrast evaluation of the intrathoracic aorta is unremarkable without aneurysm or definite acute abnormality. Mild atherosclerotic change noted within the aortic arch. Partially visualized great vessels within normal limits. Heart size within normal limits. No pericardial effusion. Scattered coronary artery calcifications noted. Main pulmonary artery dilated up to 3.7 cm, suggesting possible underlying pulmonary hypertension. Mediastinum/Nodes: No  pathologically enlarged mediastinal, hilar, or axillary lymph nodes. No mediastinal hematoma or mass. Esophagus demonstrates no acute finding. Small hiatal hernia noted. Lungs/Pleura: Tracheobronchial tree intact and patent. Lungs well inflated. Scattered linear atelectatic changes seen dependently within the lung bases bilaterally. No focal infiltrates or evidence for pulmonary contusion. No edema or pleural effusion. No pneumothorax. Small granuloma again noted at the left upper lobe, unchanged. No other worrisome pulmonary nodule or mass. Musculoskeletal: External soft tissues demonstrate no acute abnormality. No acute osseous abnormality. Multiple remotely healed right-sided rib fractures (approximately ribs 4 through 9). CT ABDOMEN PELVIS FINDINGS Hepatobiliary: Liver demonstrates a normal unenhanced appearance. Gallbladder within normal limits. No biliary dilatation. Pancreas: Pancreas within normal limits. Spleen: Spleen intact and normal. Adrenals/Urinary Tract: Adrenal glands within normal limits. Left kidney unremarkable without nephrolithiasis or hydronephrosis. No left-sided hydroureter. On the right, there is mild hydronephrosis involving the right kidney with associated mild perinephric fat stranding. No obstructive radiopaque calculi seen along the course of the right renal collecting system. No stones seen within the right kidney itself. No significant right-sided hydroureter. Partially distended bladder within normal limits. No layering stones within the bladder lumen. 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 No evidence for bowel obstruction or acute bowel injury. No acute inflammatory changes seen about the bowels. Vascular/Lymphatic: Intra-abdominal aorta of normal caliber without obvious abnormality on this noncontrast examination.  No adenopathy. Reproductive: Uterus and ovaries within normal limits. Other: No free air or fluid. No mesenteric or  retroperitoneal hematoma. Musculoskeletal: External soft tissues demonstrate no acute finding. No acute fracture or other osseous abnormality about the pelvis. Few scattered benign bone islands noted. No other discrete or worrisome osseous lesions. CT THORACIC SPINE FINDINGS Alignment: Physiologic with preservation of the normal thoracic kyphosis. No listhesis or subluxation. Vertebra: Vertebral body height maintained without evidence for acute or chronic fracture. No discrete or worrisome osseous lesions. Small benign bone island noted within the T3 vertebral body. Paraspinal soft tissues: Paraspinous soft tissues demonstrate no acute finding. Disc levels: Small degenerative endplate Schmorl's node noted at the superior endplate of T6. No significant disc pathology seen within the thoracic spine. No significant stenosis. CT LUMBAR SPINE FINDINGS Segmentation: Standard. Lowest well-formed disc space labeled the L5-S1 level. Alignment: Mild dextroscoliosis, apex at L3. Alignment otherwise normal preservation of the normal lumbar lordosis. No listhesis or subluxation. Vertebra: Vertebral body height maintained without evidence for acute or chronic fracture. Visualized sacrum and pelvis intact. SI joints approximated symmetric. No worrisome osseous lesions. Paraspinal soft tissues: Paraspinous soft tissues demonstrate no acute finding. Disc levels: L1-2:  Unremarkable. L2-3: Mild disc bulge, asymmetric to the left. Mild bilateral facet hypertrophy. No significant stenosis. L3-4: Mild disc bulge. Mild to moderate facet hypertrophy. No significant spinal stenosis. Mild bilateral foraminal narrowing. L4-5: Disc bulge with disc desiccation. Moderate facet hypertrophy. Central canal remains patent. Mild to moderate bilateral L4 foraminal stenosis, worse on the right. L5-S1: Disc bulge with disc desiccation. Moderate to severe right with mild left facet hypertrophy. No significant spinal stenosis. Severe right with moderate  left L5 foraminal narrowing. IMPRESSION: CT CHEST, ABDOMEN, AND PELVIS IMPRESSION: 1. No CT evidence for acute traumatic injury within the chest, abdomen, and pelvis. 2. Mild right-sided hydronephrosis. No obstructive stone identified. Finding of uncertain etiology, and could reflect sequelae of a recently passed stone or possibly infection. Correlation with urinalysis recommended. 3. No other acute abnormality within the chest, abdomen, and pelvis. 4. Multiple remotely healed right-sided rib fractures. CT THORACIC SPINE IMPRESSION No acute traumatic injury within the thoracic spine. CT LUMBAR SPINE IMPRESSION 1. No acute traumatic injury within the lumbar spine. 2. Degenerative spondylosis at L4-5 and L5-S1 with resultant mild to moderate bilateral L4 foraminal narrowing, with more moderate to severe bilateral L5 foraminal stenosis. Electronically Signed   By: Jeannine Boga M.D.   On: 05/21/2019 23:00   CT Cervical Spine Wo Contrast  Result Date: 05/21/2019 CLINICAL DATA:  [place] from bed EXAM: CT HEAD WITHOUT CONTRAST CT CERVICAL SPINE WITHOUT CONTRAST TECHNIQUE: Multidetector CT imaging of the head and cervical spine was performed following the standard protocol without intravenous contrast. Multiplanar CT image reconstructions of the cervical spine were also generated. COMPARISON:  CT head 04/15/2017 FINDINGS: CT HEAD FINDINGS Brain: No evidence of acute infarction, hemorrhage, hydrocephalus, extra-axial collection or mass lesion/mass effect. Symmetric prominence of the ventricles, cisterns and sulci compatible with parenchymal volume loss. Mixed patchy and confluent areas of white matter hypoattenuation are most compatible with moderate chronic microvascular angiopathy, overall extent of which is similar to comparison. Vascular: Atherosclerotic calcification of the carotid siphons and intradural vertebral arteries. No hyperdense vessel. Skull: There is some mild left frontal scalp swelling without  large hematoma or subjacent calvarial fracture. Benign hyperostosis frontalis interna is incidentally noted. Sinuses/Orbits: Paranasal sinuses and mastoid air cells are predominantly clear. Included orbital structures are unremarkable. Other: None CT CERVICAL SPINE FINDINGS Alignment: Preservation  of the normal cervical lordosis without traumatic listhesis. No abnormal facet widening. Normal alignment of the craniocervical and atlantoaxial articulations. Skull base and vertebrae: No acute fracture. No primary bone lesion or focal pathologic process. Soft tissues and spinal canal: No pre or paravertebral fluid or swelling. No visible canal hematoma. Disc levels: Minimal diffuse intervertebral disc height loss with early spondylitic endplate changes, maximal C6-7. no significant neural foraminal stenosis or spinal canal narrowing. Upper chest: No acute abnormality in the upper chest or imaged lung apices. Dedicated CT of the chest was performed concurrently. Other: Atherosclerotic calcification of the cervical carotid arteries. IMPRESSION: 1. Mild left frontal scalp swelling without large hematoma or subjacent calvarial fracture. 2. No acute intracranial abnormality. 3. Stable parenchymal volume loss and chronic microvascular ischemic white matter disease. 4. No acute cervical spine fracture or traumatic listhesis. 5. Minimal cervical spondylitic changes. Electronically Signed   By: Lovena Le M.D.   On: 05/21/2019 21:52   CT T-SPINE NO CHARGE  Result Date: 05/21/2019 CLINICAL DATA:  Initial evaluation for acute rib pain status post fall. History of recent rib fractures. EXAM: CT CHEST, ABDOMEN AND PELVIS WITHOUT CONTRAST TECHNIQUE: Multidetector CT imaging of the chest, abdomen and pelvis was performed following the standard protocol without IV contrast. COMPARISON:  Comparison made with prior CT from 01/29/2019. FINDINGS: CT CHEST FINDINGS Cardiovascular: Limited noncontrast evaluation of the intrathoracic  aorta is unremarkable without aneurysm or definite acute abnormality. Mild atherosclerotic change noted within the aortic arch. Partially visualized great vessels within normal limits. Heart size within normal limits. No pericardial effusion. Scattered coronary artery calcifications noted. Main pulmonary artery dilated up to 3.7 cm, suggesting possible underlying pulmonary hypertension. Mediastinum/Nodes: No pathologically enlarged mediastinal, hilar, or axillary lymph nodes. No mediastinal hematoma or mass. Esophagus demonstrates no acute finding. Small hiatal hernia noted. Lungs/Pleura: Tracheobronchial tree intact and patent. Lungs well inflated. Scattered linear atelectatic changes seen dependently within the lung bases bilaterally. No focal infiltrates or evidence for pulmonary contusion. No edema or pleural effusion. No pneumothorax. Small granuloma again noted at the left upper lobe, unchanged. No other worrisome pulmonary nodule or mass. Musculoskeletal: External soft tissues demonstrate no acute abnormality. No acute osseous abnormality. Multiple remotely healed right-sided rib fractures (approximately ribs 4 through 9). CT ABDOMEN PELVIS FINDINGS Hepatobiliary: Liver demonstrates a normal unenhanced appearance. Gallbladder within normal limits. No biliary dilatation. Pancreas: Pancreas within normal limits. Spleen: Spleen intact and normal. Adrenals/Urinary Tract: Adrenal glands within normal limits. Left kidney unremarkable without nephrolithiasis or hydronephrosis. No left-sided hydroureter. On the right, there is mild hydronephrosis involving the right kidney with associated mild perinephric fat stranding. No obstructive radiopaque calculi seen along the course of the right renal collecting system. No stones seen within the right kidney itself. No significant right-sided hydroureter. Partially distended bladder within normal limits. No layering stones within the bladder lumen. 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 No evidence for bowel obstruction or acute bowel injury. No acute inflammatory changes seen about the bowels. Vascular/Lymphatic: Intra-abdominal aorta of normal caliber without obvious abnormality on this noncontrast examination. No adenopathy. Reproductive: Uterus and ovaries within normal limits. Other: No free air or fluid. No mesenteric or retroperitoneal hematoma. Musculoskeletal: External soft tissues demonstrate no acute finding. No acute fracture or other osseous abnormality about the pelvis. Few scattered benign bone islands noted. No other discrete or worrisome osseous lesions. CT THORACIC SPINE FINDINGS Alignment: Physiologic with preservation of the normal thoracic kyphosis. No listhesis or subluxation. Vertebra: Vertebral  body height maintained without evidence for acute or chronic fracture. No discrete or worrisome osseous lesions. Small benign bone island noted within the T3 vertebral body. Paraspinal soft tissues: Paraspinous soft tissues demonstrate no acute finding. Disc levels: Small degenerative endplate Schmorl's node noted at the superior endplate of T6. No significant disc pathology seen within the thoracic spine. No significant stenosis. CT LUMBAR SPINE FINDINGS Segmentation: Standard. Lowest well-formed disc space labeled the L5-S1 level. Alignment: Mild dextroscoliosis, apex at L3. Alignment otherwise normal preservation of the normal lumbar lordosis. No listhesis or subluxation. Vertebra: Vertebral body height maintained without evidence for acute or chronic fracture. Visualized sacrum and pelvis intact. SI joints approximated symmetric. No worrisome osseous lesions. Paraspinal soft tissues: Paraspinous soft tissues demonstrate no acute finding. Disc levels: L1-2:  Unremarkable. L2-3: Mild disc bulge, asymmetric to the left. Mild bilateral facet hypertrophy. No significant stenosis. L3-4: Mild disc bulge. Mild to moderate facet  hypertrophy. No significant spinal stenosis. Mild bilateral foraminal narrowing. L4-5: Disc bulge with disc desiccation. Moderate facet hypertrophy. Central canal remains patent. Mild to moderate bilateral L4 foraminal stenosis, worse on the right. L5-S1: Disc bulge with disc desiccation. Moderate to severe right with mild left facet hypertrophy. No significant spinal stenosis. Severe right with moderate left L5 foraminal narrowing. IMPRESSION: CT CHEST, ABDOMEN, AND PELVIS IMPRESSION: 1. No CT evidence for acute traumatic injury within the chest, abdomen, and pelvis. 2. Mild right-sided hydronephrosis. No obstructive stone identified. Finding of uncertain etiology, and could reflect sequelae of a recently passed stone or possibly infection. Correlation with urinalysis recommended. 3. No other acute abnormality within the chest, abdomen, and pelvis. 4. Multiple remotely healed right-sided rib fractures. CT THORACIC SPINE IMPRESSION No acute traumatic injury within the thoracic spine. CT LUMBAR SPINE IMPRESSION 1. No acute traumatic injury within the lumbar spine. 2. Degenerative spondylosis at L4-5 and L5-S1 with resultant mild to moderate bilateral L4 foraminal narrowing, with more moderate to severe bilateral L5 foraminal stenosis. Electronically Signed   By: Jeannine Boga M.D.   On: 05/21/2019 23:00   CT L-SPINE NO CHARGE  Result Date: 05/21/2019 CLINICAL DATA:  Initial evaluation for acute rib pain status post fall. History of recent rib fractures. EXAM: CT CHEST, ABDOMEN AND PELVIS WITHOUT CONTRAST TECHNIQUE: Multidetector CT imaging of the chest, abdomen and pelvis was performed following the standard protocol without IV contrast. COMPARISON:  Comparison made with prior CT from 01/29/2019. FINDINGS: CT CHEST FINDINGS Cardiovascular: Limited noncontrast evaluation of the intrathoracic aorta is unremarkable without aneurysm or definite acute abnormality. Mild atherosclerotic change noted within the  aortic arch. Partially visualized great vessels within normal limits. Heart size within normal limits. No pericardial effusion. Scattered coronary artery calcifications noted. Main pulmonary artery dilated up to 3.7 cm, suggesting possible underlying pulmonary hypertension. Mediastinum/Nodes: No pathologically enlarged mediastinal, hilar, or axillary lymph nodes. No mediastinal hematoma or mass. Esophagus demonstrates no acute finding. Small hiatal hernia noted. Lungs/Pleura: Tracheobronchial tree intact and patent. Lungs well inflated. Scattered linear atelectatic changes seen dependently within the lung bases bilaterally. No focal infiltrates or evidence for pulmonary contusion. No edema or pleural effusion. No pneumothorax. Small granuloma again noted at the left upper lobe, unchanged. No other worrisome pulmonary nodule or mass. Musculoskeletal: External soft tissues demonstrate no acute abnormality. No acute osseous abnormality. Multiple remotely healed right-sided rib fractures (approximately ribs 4 through 9). CT ABDOMEN PELVIS FINDINGS Hepatobiliary: Liver demonstrates a normal unenhanced appearance. Gallbladder within normal limits. No biliary dilatation. Pancreas: Pancreas within normal limits. Spleen: Spleen intact  and normal. Adrenals/Urinary Tract: Adrenal glands within normal limits. Left kidney unremarkable without nephrolithiasis or hydronephrosis. No left-sided hydroureter. On the right, there is mild hydronephrosis involving the right kidney with associated mild perinephric fat stranding. No obstructive radiopaque calculi seen along the course of the right renal collecting system. No stones seen within the right kidney itself. No significant right-sided hydroureter. Partially distended bladder within normal limits. No layering stones within the bladder lumen. 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 No evidence for bowel obstruction or acute bowel injury.  No acute inflammatory changes seen about the bowels. Vascular/Lymphatic: Intra-abdominal aorta of normal caliber without obvious abnormality on this noncontrast examination. No adenopathy. Reproductive: Uterus and ovaries within normal limits. Other: No free air or fluid. No mesenteric or retroperitoneal hematoma. Musculoskeletal: External soft tissues demonstrate no acute finding. No acute fracture or other osseous abnormality about the pelvis. Few scattered benign bone islands noted. No other discrete or worrisome osseous lesions. CT THORACIC SPINE FINDINGS Alignment: Physiologic with preservation of the normal thoracic kyphosis. No listhesis or subluxation. Vertebra: Vertebral body height maintained without evidence for acute or chronic fracture. No discrete or worrisome osseous lesions. Small benign bone island noted within the T3 vertebral body. Paraspinal soft tissues: Paraspinous soft tissues demonstrate no acute finding. Disc levels: Small degenerative endplate Schmorl's node noted at the superior endplate of T6. No significant disc pathology seen within the thoracic spine. No significant stenosis. CT LUMBAR SPINE FINDINGS Segmentation: Standard. Lowest well-formed disc space labeled the L5-S1 level. Alignment: Mild dextroscoliosis, apex at L3. Alignment otherwise normal preservation of the normal lumbar lordosis. No listhesis or subluxation. Vertebra: Vertebral body height maintained without evidence for acute or chronic fracture. Visualized sacrum and pelvis intact. SI joints approximated symmetric. No worrisome osseous lesions. Paraspinal soft tissues: Paraspinous soft tissues demonstrate no acute finding. Disc levels: L1-2:  Unremarkable. L2-3: Mild disc bulge, asymmetric to the left. Mild bilateral facet hypertrophy. No significant stenosis. L3-4: Mild disc bulge. Mild to moderate facet hypertrophy. No significant spinal stenosis. Mild bilateral foraminal narrowing. L4-5: Disc bulge with disc  desiccation. Moderate facet hypertrophy. Central canal remains patent. Mild to moderate bilateral L4 foraminal stenosis, worse on the right. L5-S1: Disc bulge with disc desiccation. Moderate to severe right with mild left facet hypertrophy. No significant spinal stenosis. Severe right with moderate left L5 foraminal narrowing. IMPRESSION: CT CHEST, ABDOMEN, AND PELVIS IMPRESSION: 1. No CT evidence for acute traumatic injury within the chest, abdomen, and pelvis. 2. Mild right-sided hydronephrosis. No obstructive stone identified. Finding of uncertain etiology, and could reflect sequelae of a recently passed stone or possibly infection. Correlation with urinalysis recommended. 3. No other acute abnormality within the chest, abdomen, and pelvis. 4. Multiple remotely healed right-sided rib fractures. CT THORACIC SPINE IMPRESSION No acute traumatic injury within the thoracic spine. CT LUMBAR SPINE IMPRESSION 1. No acute traumatic injury within the lumbar spine. 2. Degenerative spondylosis at L4-5 and L5-S1 with resultant mild to moderate bilateral L4 foraminal narrowing, with more moderate to severe bilateral L5 foraminal stenosis. Electronically Signed   By: Jeannine Boga M.D.   On: 05/21/2019 23:00    Microbiology: Recent Results (from the past 240 hour(s))  Respiratory Panel by RT PCR (Flu A&B, Covid) - Nasopharyngeal Swab     Status: None   Collection Time: 05/21/19 10:18 PM   Specimen: Nasopharyngeal Swab  Result Value Ref Range Status   SARS Coronavirus 2 by RT PCR NEGATIVE NEGATIVE Final    Comment: (NOTE)  SARS-CoV-2 target nucleic acids are NOT DETECTED. The SARS-CoV-2 RNA is generally detectable in upper respiratoy specimens during the acute phase of infection. The lowest concentration of SARS-CoV-2 viral copies this assay can detect is 131 copies/mL. A negative result does not preclude SARS-Cov-2 infection and should not be used as the sole basis for treatment or other patient  management decisions. A negative result may occur with  improper specimen collection/handling, submission of specimen other than nasopharyngeal swab, presence of viral mutation(s) within the areas targeted by this assay, and inadequate number of viral copies (<131 copies/mL). A negative result must be combined with clinical observations, patient history, and epidemiological information. The expected result is Negative. Fact Sheet for Patients:  PinkCheek.be Fact Sheet for Healthcare Providers:  GravelBags.it This test is not yet ap proved or cleared by the [place] FDA and  has been authorized for detection and/or diagnosis of SARS-CoV-2 by FDA under an Emergency Use Authorization (EUA). This EUA will remain  in effect (meaning this test can be used) for the duration of the COVID-19 declaration under Section 564(b)(1) of the Act, 21 U.S.C. section 360bbb-3(b)(1), unless the authorization is terminated or revoked sooner.    Influenza A by PCR NEGATIVE NEGATIVE Final   Influenza B by PCR NEGATIVE NEGATIVE Final    Comment: (NOTE) The Xpert Xpress SARS-CoV-2/FLU/RSV assay is intended as an aid in  the diagnosis of influenza from Nasopharyngeal swab specimens and  should not be used as a sole basis for treatment. Nasal washings and  aspirates are unacceptable for Xpert Xpress SARS-CoV-2/FLU/RSV  testing. Fact Sheet for Patients: PinkCheek.be Fact Sheet for Healthcare Providers: GravelBags.it This test is not yet approved or cleared by the [place] FDA and  has been authorized for detection and/or diagnosis of SARS-CoV-2 by  FDA under an Emergency Use Authorization (EUA). This EUA will remain  in effect (meaning this test can be used) for the duration of the  Covid-19 declaration under Section 564(b)(1) of the Act, 21  U.S.C. section 360bbb-3(b)(1), unless the  authorization is  terminated or revoked. Performed at [facility], [address] [place], [place] [address]   Blood culture (routine x 2)     Status: Abnormal   Collection Time: 05/21/19 11:30 PM   Specimen: BLOOD  Result Value Ref Range Status   Specimen Description   Final    BLOOD BLOOD RIGHT HAND Performed at [facility], [address], [name], [place] [address]    Special Requests   Final    BOTTLES DRAWN AEROBIC AND ANAEROBIC Blood Culture adequate volume Performed at [facility], [address] [place], [place] [address]    Culture  Setup Time   Final    GRAM NEGATIVE RODS IN BOTH AEROBIC AND ANAEROBIC BOTTLES CRITICAL RESULT CALLED TO, READ BACK BY AND VERIFIED WITH: MICHAEL SIMPSON AT [id number] 05/22/19.PMF Performed at [facility], [place] [address], [place], [name] [address]    Culture ESCHERICHIA COLI (A)  Final   Report Status 05/24/2019 FINAL  Final   Organism ID, Bacteria ESCHERICHIA COLI  Final      Susceptibility   Escherichia coli - MIC*    AMPICILLIN <=2 SENSITIVE Sensitive     CEFAZOLIN <=4 SENSITIVE Sensitive     CEFEPIME <=0.12 SENSITIVE Sensitive     CEFTAZIDIME <=1 SENSITIVE Sensitive     CEFTRIAXONE <=0.25 SENSITIVE Sensitive     CIPROFLOXACIN <=0.25 SENSITIVE Sensitive     GENTAMICIN <=1 SENSITIVE Sensitive  IMIPENEM <=0.25 SENSITIVE Sensitive     TRIMETH/SULFA <=20 SENSITIVE Sensitive     AMPICILLIN/SULBACTAM <=2 SENSITIVE Sensitive     PIP/TAZO <=4 SENSITIVE Sensitive     * ESCHERICHIA COLI  Blood culture (routine x 2)     Status: None (Preliminary result)   Collection Time: 05/21/19 11:30 PM   Specimen: BLOOD  Result Value Ref Range Status   Specimen Description BLOOD BLOOD LEFT FOREARM  Final   Special Requests   Final    BOTTLES DRAWN AEROBIC AND ANAEROBIC Blood Culture adequate volume   Culture   Final    NO GROWTH 4 DAYS Performed at [facility], [address] [place], [place]  [address]    Report Status PENDING  Incomplete  Blood Culture ID Panel (Reflexed)     Status: Abnormal   Collection Time: 05/21/19 11:30 PM  Result Value Ref Range Status   Enterococcus species NOT DETECTED NOT DETECTED Final   Listeria monocytogenes NOT DETECTED NOT DETECTED Final   Staphylococcus species NOT DETECTED NOT DETECTED Final   Staphylococcus aureus (BCID) NOT DETECTED NOT DETECTED Final   Streptococcus species NOT DETECTED NOT DETECTED Final   Streptococcus agalactiae NOT DETECTED NOT DETECTED Final   Streptococcus pneumoniae NOT DETECTED NOT DETECTED Final   Streptococcus pyogenes NOT DETECTED NOT DETECTED Final   Acinetobacter baumannii NOT DETECTED NOT DETECTED Final   Enterobacteriaceae species DETECTED (A) NOT DETECTED Final    Comment: Enterobacteriaceae represent a large family of gram-negative bacteria, not a single organism. CRITICAL RESULT CALLED TO, READ BACK BY AND VERIFIED WITH: MICHAEL SIMPSON AT [id number] 05/22/19.PMF    Enterobacter cloacae complex NOT DETECTED NOT DETECTED Final   Escherichia coli DETECTED (A) NOT DETECTED Final    Comment: CRITICAL RESULT CALLED TO, READ BACK BY AND VERIFIED WITH: MICHAEL SIMPSON AT [id number] 05/22/19.PMF    Klebsiella oxytoca NOT DETECTED NOT DETECTED Final   Klebsiella pneumoniae NOT DETECTED NOT DETECTED Final   Proteus species NOT DETECTED NOT DETECTED Final   Serratia marcescens NOT DETECTED NOT DETECTED Final   Carbapenem resistance NOT DETECTED NOT DETECTED Final   Haemophilus influenzae NOT DETECTED NOT DETECTED Final   Neisseria meningitidis NOT DETECTED NOT DETECTED Final   Pseudomonas aeruginosa NOT DETECTED NOT DETECTED Final   Candida albicans NOT DETECTED NOT DETECTED Final   Candida glabrata NOT DETECTED NOT DETECTED Final   Candida krusei NOT DETECTED NOT DETECTED Final   Candida parapsilosis NOT DETECTED NOT DETECTED Final   Candida tropicalis NOT DETECTED NOT DETECTED Final    Comment: Performed at [facility], [address], [place], [place] [address]  Urine culture     Status: Abnormal   Collection Time: 05/22/19 11:25 PM   Specimen: Urine, Random  Result Value Ref Range Status   Specimen Description   Final    URINE, RANDOM Performed at [facility], [address] [place], [place] [address]    Special Requests   Final    NONE Performed at [facility], [address], [place], [place] [address]    Culture (A)  Final    <10,000 COLONIES/mL INSIGNIFICANT GROWTH Performed at [facility] [address], [place], [place] [address]    Report Status 05/24/2019 FINAL  Final     Labs: Basic Metabolic Panel: Recent Labs  Lab 05/21/19 2133 05/22/19 0548 05/23/19 0624 05/24/19 0430 05/25/19 0623  NA 139 139 138 138 140  K 4.2 3.8 3.6 3.4* 4.0  CL 108  112* 112* 111 110  CO2 22 19* 17* 21* 22  GLUCOSE 135* 91 104* 89 92  BUN 29* 23 19 13 11   CREATININE 1.34* 0.90 0.91 0.63 0.67  CALCIUM 8.4* 7.6* 6.9* 7.0* 8.1*   Liver Function Tests: Recent Labs  Lab 05/21/19 2133  AST 17  ALT 11  ALKPHOS 82  BILITOT 1.0  PROT 6.5  ALBUMIN 3.3*   No results for input(s): LIPASE, AMYLASE in the last 168 hours. No results for input(s): AMMONIA in the last 168 hours. CBC: Recent Labs  Lab 05/21/19 2133 05/21/19 2133 05/22/19 0548 05/23/19 0624 05/23/19 1532 05/24/19 0430 05/25/19 0623  WBC 27.8*   < > 17.1* 21.5* 20.0* 15.5* 8.9  NEUTROABS 23.0*  --   --   --   --   --   --   HGB 12.8   < > 12.0 11.1* 10.9* 11.4* 12.1  HCT 39.7   < > 36.5 35.0* 34.3* 35.5* 37.1  MCV 95.0   < > 94.1 94.9 96.1 95.2 93.2  PLT 184   < > 149* 136* 128* 142* 154   < > = values in this interval not displayed.   Cardiac Enzymes: Recent Labs  Lab 05/21/19 2133  CKTOTAL 260*   BNP: BNP (last 3 results) No results for input(s): BNP in the last 8760 hours.  ProBNP (last 3 results) No results for input(s): PROBNP in the last 8760 hours.  CBG: No results  for input(s): GLUCAP in the last 168 hours.     Signed:  Annita Brod, MD [facility] 05/25/2019, 3:54 PM

## 2019-05-25 NOTE — Progress Notes (Signed)
Progress/Discharge note Writer went over discharge instructions in spanish with patient, pt verbalized understaning.  She also understands she has one prescription to pick [facility] at [facility]. She was taken to medical mall entrance with all her belongings to where her granddaughter was waiting.

## 2019-05-25 NOTE — Progress Notes (Signed)
Pharmacy note:  Ceftriaxone dosing for Bacteremia   69 yo F with bacteremia-E.coli and ?UTI Patient was on Meropenem which was switched to Ceftriaxone 1 gram IV q24h.  Will adjust Ceftriaxone to 2 gram IV q24h for Bacteremia dosing.  Chinita Greenland PharmD Clinical Pharmacist 05/25/2019

## 2019-05-25 NOTE — Care Management Important Message (Signed)
Important Message  Patient Details  Name: Shelly Freeman MRN: [id number] Date of Birth: [birth year]   Medicare Important Message Given:  N/A - LOS <3 / Initial given by admissions     Juliann Pulse A Karmon Andis 05/25/2019, 9:29 AM

## 2019-05-26 LAB — CULTURE, BLOOD (ROUTINE X 2)
Culture: NO GROWTH
Special Requests: ADEQUATE

## 2019-05-27 ENCOUNTER — Encounter: Payer: Self-pay | Admitting: Emergency Medicine

## 2019-05-27 ENCOUNTER — Other Ambulatory Visit: Payer: Self-pay

## 2019-05-27 ENCOUNTER — Emergency Department
Admission: EM | Admit: 2019-05-27 | Discharge: 2019-05-27 | Disposition: A | Discharge status: Home / Self Care | Payer: Medicare Other | Attending: Emergency Medicine | Admitting: Emergency Medicine

## 2019-05-27 ENCOUNTER — Emergency Department: Payer: Medicare Other

## 2019-05-27 DIAGNOSIS — U071 COVID-19: Secondary | ICD-10-CM

## 2019-05-27 DIAGNOSIS — E039 Hypothyroidism, unspecified: Secondary | ICD-10-CM | POA: Diagnosis not present

## 2019-05-27 DIAGNOSIS — M545 Low back pain: Secondary | ICD-10-CM | POA: Diagnosis present

## 2019-05-27 DIAGNOSIS — Z87440 Personal history of urinary (tract) infections: Secondary | ICD-10-CM | POA: Diagnosis not present

## 2019-05-27 DIAGNOSIS — J449 Chronic obstructive pulmonary disease, unspecified: Secondary | ICD-10-CM | POA: Diagnosis not present

## 2019-05-27 DIAGNOSIS — I10 Essential (primary) hypertension: Secondary | ICD-10-CM | POA: Diagnosis not present

## 2019-05-27 DIAGNOSIS — M3210 Systemic lupus erythematosus, organ or system involvement unspecified: Secondary | ICD-10-CM | POA: Insufficient documentation

## 2019-05-27 LAB — COMPREHENSIVE METABOLIC PANEL
ALT: 29 U/L (ref 0–44)
AST: 34 U/L (ref 15–41)
Albumin: 3.1 g/dL — ABNORMAL LOW (ref 3.5–5.0)
Alkaline Phosphatase: 94 U/L (ref 38–126)
Anion gap: 10 (ref 5–15)
BUN: 17 mg/dL (ref 8–23)
CO2: 24 mmol/L (ref 22–32)
Calcium: 8.9 mg/dL (ref 8.9–10.3)
Chloride: 106 mmol/L (ref 98–111)
Creatinine, Ser: 0.85 mg/dL (ref 0.44–1.00)
GFR calc Af Amer: >60 mL/min (ref >60)
GFR calc non Af Amer: >60 mL/min (ref >60)
Glucose, Bld: 99 mg/dL (ref 70–99)
Potassium: 4.7 mmol/L (ref 3.5–5.1)
Sodium: 140 mmol/L (ref 135–145)
Total Bilirubin: 0.5 mg/dL (ref 0.3–1.2)
Total Protein: 7.1 g/dL (ref 6.5–8.1)

## 2019-05-27 LAB — CBC WITH DIFFERENTIAL/PLATELET
Abs Immature Granulocytes: 0.17 10*3/uL — ABNORMAL HIGH (ref 0.00–0.07)
Basophils Absolute: 0 10*3/uL (ref 0.0–0.1)
Basophils Relative: 0 %
Eosinophils Absolute: 0 10*3/uL (ref 0.0–0.5)
Eosinophils Relative: 1 %
HCT: 40.9 % (ref 36.0–46.0)
Hemoglobin: 13 g/dL (ref 12.0–15.0)
Immature Granulocytes: 2 %
Lymphocytes Relative: 4 %
Lymphs Abs: 0.3 10*3/uL — ABNORMAL LOW (ref 0.7–4.0)
MCH: 30.4 pg (ref 26.0–34.0)
MCHC: 31.8 g/dL (ref 30.0–36.0)
MCV: 95.8 fL (ref 80.0–100.0)
Monocytes Absolute: 0.5 10*3/uL (ref 0.1–1.0)
Monocytes Relative: 7 %
Neutro Abs: 6 10*3/uL (ref 1.7–7.7)
Neutrophils Relative %: 86 %
Platelets: 194 10*3/uL (ref 150–400)
RBC: 4.27 MIL/uL (ref 3.87–5.11)
RDW: 15.7 % — ABNORMAL HIGH (ref 11.5–15.5)
WBC: 7 10*3/uL (ref 4.0–10.5)
nRBC: 0 % (ref 0.0–0.2)

## 2019-05-27 LAB — URINALYSIS, COMPLETE (UACMP) WITH MICROSCOPIC
Bacteria, UA: NONE SEEN
Bilirubin Urine: NEGATIVE
Glucose, UA: NEGATIVE mg/dL
Hgb urine dipstick: NEGATIVE
Ketones, ur: NEGATIVE mg/dL
Leukocytes,Ua: NEGATIVE
Nitrite: NEGATIVE
Protein, ur: NEGATIVE mg/dL
Specific Gravity, Urine: 1.014 (ref 1.005–1.030)
pH: 5 (ref 5.0–8.0)

## 2019-05-27 LAB — PROTIME-INR
INR: 0.9 (ref 0.8–1.2)
Prothrombin Time: 12.5 seconds (ref 11.4–15.2)

## 2019-05-27 LAB — LACTIC ACID, PLASMA: Lactic Acid, Venous: 1 mmol/L (ref 0.5–1.9)

## 2019-05-27 LAB — POC SARS CORONAVIRUS 2 AG: SARS Coronavirus 2 Ag: POSITIVE — AB

## 2019-05-27 MED ORDER — METOCLOPRAMIDE HCL 5 MG/ML IJ SOLN
10.0000 mg | Freq: Once | INTRAMUSCULAR | Status: AC
  Administered 2019-05-27: 10 mg via INTRAVENOUS
  Filled 2019-05-27: qty 2

## 2019-05-27 MED ORDER — ACETAMINOPHEN 325 MG PO TABS
650.0000 mg | ORAL_TABLET | Freq: Once | ORAL | Status: AC | PRN
  Administered 2019-05-27: 650 mg via ORAL
  Filled 2019-05-27: qty 2

## 2019-05-27 MED ORDER — HYDROCOD POLST-CPM POLST ER 10-8 MG/5ML PO SUER: 5.0000 mL | Freq: Two times a day (BID) | ORAL | 0 refills | Status: DC

## 2019-05-27 MED ORDER — SODIUM CHLORIDE 0.9 % IV SOLN
1.0000 g | Freq: Once | INTRAVENOUS | Status: AC
  Administered 2019-05-27: 1 g via INTRAVENOUS
  Filled 2019-05-27: qty 10

## 2019-05-27 MED ORDER — PREDNISONE 10 MG (21) PO TBPK: ORAL_TABLET | ORAL | 0 refills | Status: DC

## 2019-05-27 MED ORDER — SODIUM CHLORIDE 0.9 % IV BOLUS
500.0000 mL | Freq: Once | INTRAVENOUS | Status: AC
  Administered 2019-05-27: 500 mL via INTRAVENOUS

## 2019-05-27 MED ORDER — MORPHINE SULFATE (PF) 4 MG/ML IV SOLN
4.0000 mg | Freq: Once | INTRAVENOUS | Status: AC
  Administered 2019-05-27: 4 mg via INTRAVENOUS
  Filled 2019-05-27: qty 1

## 2019-05-27 MED ORDER — KETOROLAC TROMETHAMINE 30 MG/ML IJ SOLN
15.0000 mg | Freq: Once | INTRAMUSCULAR | Status: AC
  Administered 2019-05-27: 15 mg via INTRAVENOUS
  Filled 2019-05-27: qty 1

## 2019-05-27 NOTE — ED Notes (Signed)
Attempted to call granddaughter whose number is on file for ride home at this time with no answer. Pt remains in room at this time

## 2019-05-27 NOTE — ED Provider Notes (Signed)
[facility] Emergency Department Provider Note       Time seen: ----------------------------------------- 5:20 PM on 05/27/2019 -----------------------------------------  I have reviewed the triage vital signs and the nursing notes. HISTORY   Chief Complaint Back Pain    HPI Shelly Freeman is a 69 y.o. female with a history of arthritis, collagen vascular disease, degenerative disc disease, depression, hypertension, hypothyroidism, lupus, neuromuscular disorder, Sirs who presents to the ED for low back pain since yesterday.  Patient was discharged in the hospital on the 17th for UTI, she also had E. coli bacteremia.  Patient states she fell around midnight last night, denies loss of consciousness.  EMS states she had a fever and was hypotensive.  She was found to be febrile but not hypotensive on arrival.  Past Medical History:  Diagnosis Date  . Arthritis   . Collagen vascular disease ([place])   . DDD (degenerative disc disease), lumbar 05/14/2016  . Depression   . Heart valve problem    per grandaughter need surgery but they told her may not survive;  [name] Hypertension   . Hypothyroidism   . Lupus ([place])   . Neuromuscular disorder ([place])   . Thyroid disease   . Vitamin D deficiency 05/14/2016    Patient Active Problem List   Diagnosis Date Noted  . Morbid obesity ([place]) 05/23/2019  . Acute lower UTI 05/23/2019  . Right rib fracture 05/23/2019  . SIRS (systemic inflammatory response syndrome) ([place]) 05/23/2019  . Bacteremia due to Gram-negative bacteria 05/23/2019  . Acute bronchiolitis 01/28/2019  . Left anterior knee pain 10/24/2016  . Right upper quadrant pain 10/24/2016  . Urination pain 06/26/2016  . DDD (degenerative disc disease), lumbar 05/14/2016  . Lipoma of skin 05/14/2016  . Vitamin D deficiency, unspecified 05/14/2016  . Major neurocognitive disorder, due to vascular disease, with behavioral disturbance, mild ([place]) 04/15/2016  . Severe  recurrent major depression with psychotic features ([place]) 04/04/2016  . Lupus ([place]) 04/04/2016  . GERD (gastroesophageal reflux disease) 04/04/2016  . COPD with acute bronchitis ([place]) 04/04/2016  . Acute respiratory failure with hypoxia ([place]) 04/03/2016  . Encephalopathy, metabolic 0000000  . Hypokalemia 04/03/2016  . Pain in limb 04/03/2016  . Hypothyroidism 04/03/2016  . Essential hypertension 04/03/2016  . Intentional opiate overdose ([place]) 03/30/2016  . Age related osteoporosis 03/19/2016  . Chronic midline low back pain without sciatica 03/19/2016  . Encounter for long-term (current) use of high-risk medication 03/19/2016  . Primary osteoarthritis of both knees 03/19/2016    Past Surgical History:  Procedure Laterality Date  . APPENDECTOMY    . BREAST BIOPSY Left    neg  . BREAST LUMPECTOMY Left   . CESAREAN SECTION    . COLONOSCOPY WITH PROPOFOL N/A 07/01/2017   Procedure: COLONOSCOPY WITH PROPOFOL;  Surgeon: Manya Silvas, MD;  Location: [facility] ENDOSCOPY;  Service: Endoscopy;  Laterality: N/A;  . ESOPHAGOGASTRODUODENOSCOPY (EGD) WITH PROPOFOL N/A 07/01/2017   Procedure: ESOPHAGOGASTRODUODENOSCOPY (EGD) WITH PROPOFOL;  Surgeon: Manya Silvas, MD;  Location: [facility] ENDOSCOPY;  Service: Endoscopy;  Laterality: N/A;  . GASTRIC BYPASS    . TUBAL LIGATION      Allergies Patient has no known allergies.  Social History Social History   Tobacco Use  . Smoking status: Never Smoker  . Smokeless tobacco: Never Used  Substance Use Topics  . Alcohol use: No  . Drug use: No   Review of Systems Constitutional: Positive for fever Cardiovascular: Negative for chest pain. Respiratory: Negative for shortness of breath. Gastrointestinal:  Negative for abdominal pain, vomiting and diarrhea. Musculoskeletal: Positive for back pain Skin: Negative for rash. Neurological: Negative for headaches, positive for weakness  All systems negative/normal/unremarkable except as stated in  the HPI  ____________________________________________   PHYSICAL EXAM:  VITAL SIGNS: ED Triage Vitals  Enc Vitals Group     BP 05/27/19 1455 (!) 134/46     Pulse Rate 05/27/19 1455 (!) 104     Resp 05/27/19 1455 20     Temp 05/27/19 1455 98.3 F (36.8 C)     Temp Source 05/27/19 1455 Oral     SpO2 05/27/19 1455 95 %     Weight 05/27/19 1456 (!) 313 lb 0.9 oz (142 kg)     Height 05/27/19 1456 5\' 1"  (1.549 m)     Head Circumference --      Peak Flow --      Pain Score 05/27/19 1455 8     Pain Loc --      Pain Edu? --      Excl. in [place]? --    Constitutional: Alert and oriented.  Mild distress, morbidly obese Eyes: Conjunctivae are normal. Normal extraocular movements. ENT      Head: Normocephalic and atraumatic.      Nose: No congestion/rhinnorhea.      Mouth/Throat: Mucous membranes are moist.      Neck: No stridor. Cardiovascular: Normal rate, regular rhythm. No murmurs, rubs, or gallops. Respiratory: Normal respiratory effort without tachypnea nor retractions. Breath sounds are clear and equal bilaterally. No wheezes/rales/rhonchi. Gastrointestinal: Soft and nontender. Normal bowel sounds Musculoskeletal: Nontender with normal range of motion in extremities. No lower extremity tenderness nor edema. Neurologic:  Normal speech and language. No gross focal neurologic deficits are appreciated.  Skin:  Skin is warm, dry and intact. No rash noted. Psychiatric: Mood and affect are normal. Speech and behavior are normal.  ____________________________________________  ED COURSE:  As part of my medical decision making, I reviewed the following data within the [place] History obtained from family if available, nursing notes, old chart and ekg, as well as notes from prior ED visits. Patient presented for back pain and fever, we will assess with labs and imaging as indicated at this time.   Procedures  Shelly Freeman was evaluated in Emergency Department on  05/27/2019 for the symptoms described in the history of present illness. She was evaluated in the context of the global COVID-19 pandemic, which necessitated consideration that the patient might be at risk for infection with the SARS-CoV-2 virus that causes COVID-19. Institutional protocols and algorithms that pertain to the evaluation of patients at risk for COVID-19 are in a state of rapid change based on information released by regulatory bodies including the CDC and federal and state organizations. These policies and algorithms were followed during the patient's care in the ED.  ____________________________________________   LABS (pertinent positives/negatives)  Labs Reviewed  COMPREHENSIVE METABOLIC PANEL - Abnormal; Notable for the following components:      Result Value   Albumin 3.1 (*)    All other components within normal limits  CBC WITH DIFFERENTIAL/PLATELET - Abnormal; Notable for the following components:   RDW 15.7 (*)    Lymphs Abs 0.3 (*)    Abs Immature Granulocytes 0.17 (*)    All other components within normal limits  URINALYSIS, COMPLETE (UACMP) WITH MICROSCOPIC - Abnormal; Notable for the following components:   Color, Urine YELLOW (*)    APPearance CLEAR (*)    All other components  within normal limits  POC SARS CORONAVIRUS 2 AG - Abnormal; Notable for the following components:   SARS Coronavirus 2 Ag POSITIVE (*)    All other components within normal limits  CULTURE, BLOOD (ROUTINE X 2)  CULTURE, BLOOD (ROUTINE X 2)  URINE CULTURE  LACTIC ACID, PLASMA  PROTIME-INR  LACTIC ACID, PLASMA  POC SARS CORONAVIRUS 2 AG -  ED    RADIOLOGY Images were viewed by me  CXR Revealed linear and streaky opacities in the left base ____________________________________________   DIFFERENTIAL DIAGNOSIS   UTI, pyelonephritis, sepsis, COVID-19, pneumonia, dehydration  FINAL ASSESSMENT AND PLAN  COVID-19   Plan: The patient had presented for fever and flank pain.  Patient's labs look surprisingly normal with a normal white blood cell count, normal lactic acid level.  Urine was borderline for infection.  Patient's imaging was borderline for infection or atelectasis including COVID-19.  Patient was given fluids, Reglan, Toradol and Rocephin.  Covid testing was positive unfortunately.  She will be discharged with pain medicine and steroids.  She is advised to return for worsening or worrisome symptoms.   Laurence Aly, MD    Note: This note was generated in part or whole with voice recognition software. Voice recognition is usually quite accurate but there are transcription errors that can and very often do occur. I apologize for any typographical errors that were not detected and corrected.     Earleen Newport, MD 05/27/19 [phone]

## 2019-05-27 NOTE — ED Notes (Signed)
Pt reports back pain.  Pt with a fever and recent uti.  Discharged from [facility] recently.  Iv in place.  meds given and fluids infusing.  Pt alert  Speech clear.  nsr on monitor.

## 2019-05-27 NOTE — ED Notes (Signed)
Report off to gracie  rn  

## 2019-05-27 NOTE — ED Triage Notes (Signed)
Patient presents to the ED with lower back pain since yesterday.  Patient was discharged from the hospital on the 17th for a UTI.  Patient states she fell around midnight last night.  Patient denies dizziness.  Patient states she hit her head a little.  Denies loss of consciousness.  Per EMS patient had a fever and was hypotensive.  Patient is alert and oriented at this time.  Patient was able to walk to restroom with some assistance.

## 2019-05-27 NOTE — ED Notes (Signed)
poct covid positive

## 2019-05-29 LAB — URINE CULTURE: Culture: <10000 — AB

## 2019-06-01 LAB — CULTURE, BLOOD (ROUTINE X 2)
Culture: NO GROWTH
Special Requests: ADEQUATE

## 2019-06-02 ENCOUNTER — Other Ambulatory Visit: Payer: Self-pay

## 2019-06-02 ENCOUNTER — Emergency Department
Admission: EM | Admit: 2019-06-02 | Discharge: 2019-06-02 | Disposition: A | Discharge status: Home / Self Care | Payer: Medicare Other | Attending: Emergency Medicine | Admitting: Emergency Medicine

## 2019-06-02 ENCOUNTER — Emergency Department: Payer: Medicare Other

## 2019-06-02 ENCOUNTER — Encounter: Payer: Self-pay | Admitting: Emergency Medicine

## 2019-06-02 DIAGNOSIS — J449 Chronic obstructive pulmonary disease, unspecified: Secondary | ICD-10-CM | POA: Insufficient documentation

## 2019-06-02 DIAGNOSIS — R0789 Other chest pain: Secondary | ICD-10-CM | POA: Diagnosis not present

## 2019-06-02 DIAGNOSIS — Z79899 Other long term (current) drug therapy: Secondary | ICD-10-CM | POA: Insufficient documentation

## 2019-06-02 DIAGNOSIS — E039 Hypothyroidism, unspecified: Secondary | ICD-10-CM | POA: Diagnosis not present

## 2019-06-02 DIAGNOSIS — E860 Dehydration: Secondary | ICD-10-CM | POA: Diagnosis not present

## 2019-06-02 DIAGNOSIS — U071 COVID-19: Secondary | ICD-10-CM | POA: Diagnosis not present

## 2019-06-02 DIAGNOSIS — R05 Cough: Secondary | ICD-10-CM | POA: Diagnosis not present

## 2019-06-02 DIAGNOSIS — I10 Essential (primary) hypertension: Secondary | ICD-10-CM | POA: Diagnosis not present

## 2019-06-02 DIAGNOSIS — R42 Dizziness and giddiness: Secondary | ICD-10-CM | POA: Diagnosis not present

## 2019-06-02 DIAGNOSIS — R55 Syncope and collapse: Secondary | ICD-10-CM

## 2019-06-02 LAB — URINALYSIS, COMPLETE (UACMP) WITH MICROSCOPIC
Bacteria, UA: NONE SEEN
Bilirubin Urine: NEGATIVE
Glucose, UA: NEGATIVE mg/dL
Hgb urine dipstick: NEGATIVE
Ketones, ur: NEGATIVE mg/dL
Leukocytes,Ua: NEGATIVE
Nitrite: NEGATIVE
Protein, ur: NEGATIVE mg/dL
Specific Gravity, Urine: 1.014 (ref 1.005–1.030)
pH: 5 (ref 5.0–8.0)

## 2019-06-02 LAB — CBC WITH DIFFERENTIAL/PLATELET
Abs Immature Granulocytes: 0.02 10*3/uL (ref 0.00–0.07)
Basophils Absolute: 0 10*3/uL (ref 0.0–0.1)
Basophils Relative: 0 %
Eosinophils Absolute: 0 10*3/uL (ref 0.0–0.5)
Eosinophils Relative: 1 %
HCT: 42 % (ref 36.0–46.0)
Hemoglobin: 13 g/dL (ref 12.0–15.0)
Immature Granulocytes: 1 %
Lymphocytes Relative: 24 %
Lymphs Abs: 0.9 10*3/uL (ref 0.7–4.0)
MCH: 29.8 pg (ref 26.0–34.0)
MCHC: 31 g/dL (ref 30.0–36.0)
MCV: 96.3 fL (ref 80.0–100.0)
Monocytes Absolute: 0.3 10*3/uL (ref 0.1–1.0)
Monocytes Relative: 7 %
Neutro Abs: 2.6 10*3/uL (ref 1.7–7.7)
Neutrophils Relative %: 67 %
Platelets: 216 10*3/uL (ref 150–400)
RBC: 4.36 MIL/uL (ref 3.87–5.11)
RDW: 14.8 % (ref 11.5–15.5)
WBC: 3.9 10*3/uL — ABNORMAL LOW (ref 4.0–10.5)
nRBC: 0 % (ref 0.0–0.2)

## 2019-06-02 LAB — COMPREHENSIVE METABOLIC PANEL
ALT: 35 U/L (ref 0–44)
AST: 37 U/L (ref 15–41)
Albumin: 2.9 g/dL — ABNORMAL LOW (ref 3.5–5.0)
Alkaline Phosphatase: 86 U/L (ref 38–126)
Anion gap: 5 (ref 5–15)
BUN: 16 mg/dL (ref 8–23)
CO2: 27 mmol/L (ref 22–32)
Calcium: 8.5 mg/dL — ABNORMAL LOW (ref 8.9–10.3)
Chloride: 107 mmol/L (ref 98–111)
Creatinine, Ser: 0.83 mg/dL (ref 0.44–1.00)
GFR calc Af Amer: >60 mL/min (ref >60)
GFR calc non Af Amer: >60 mL/min (ref >60)
Glucose, Bld: 94 mg/dL (ref 70–99)
Potassium: 4.6 mmol/L (ref 3.5–5.1)
Sodium: 139 mmol/L (ref 135–145)
Total Bilirubin: 0.5 mg/dL (ref 0.3–1.2)
Total Protein: 6.7 g/dL (ref 6.5–8.1)

## 2019-06-02 LAB — TROPONIN I (HIGH SENSITIVITY): Troponin I (High Sensitivity): 4 ng/L (ref <18)

## 2019-06-02 MED ORDER — SODIUM CHLORIDE 0.9 % IV BOLUS
1000.0000 mL | Freq: Once | INTRAVENOUS | Status: AC
  Administered 2019-06-02: 12:00:00 1000 mL via INTRAVENOUS

## 2019-06-02 NOTE — ED Triage Notes (Signed)
Pt presents to ED via [facility] from home with c/o fall and dizziness. Per EMS pt found on the floor in the bathroom, unknown how long patient was on the bathroom floor. Pt c/o continued dizziness upon arrival to ED. Pt c/o pain to back and chest at this time.   Pt states CP substernal at this time. Pt states dx with Covid 05/31/19. Pt is alert, however difficult to understand due to speaking low.    141/50 95% 68HR

## 2019-06-02 NOTE — ED Provider Notes (Signed)
[facility] Emergency Department Provider Note ____________________________________________   First MD Initiated Contact with Patient 06/02/19 1105     (approximate)  I have reviewed the triage vital signs and the nursing notes.   HISTORY  Chief Complaint Fall, Dizziness, and Chest Pain  History of present illness obtained via video interpreter  HPI Shelly Freeman is a 69 y.o. female with PMH as noted below as well as a recent diagnosis of COVID-19 who presents with near syncope, acute onset this morning while she was going to the bathroom to urinate.  The patient states that she felt dizzy and described it as a lightheadedness rather than spinning.  It caused her to fall on the floor and hit the back of her head.  She states that she still feels somewhat dizzy although it has improved.  She also reports some right-sided chest pain although this appears to be have been going on for longer.  She states she has a mild cough and has been feeling a bit weak, but denies fever, or any vomiting or diarrhea.  Past Medical History:  Diagnosis Date  . Arthritis   . Collagen vascular disease ([place])   . DDD (degenerative disc disease), lumbar 05/14/2016  . Depression   . Heart valve problem    per grandaughter need surgery but they told her may not survive;  [name] Hypertension   . Hypothyroidism   . Lupus ([place])   . Neuromuscular disorder ([place])   . Thyroid disease   . Vitamin D deficiency 05/14/2016    Patient Active Problem List   Diagnosis Date Noted  . Morbid obesity ([place]) 05/23/2019  . Acute lower UTI 05/23/2019  . Right rib fracture 05/23/2019  . SIRS (systemic inflammatory response syndrome) ([place]) 05/23/2019  . Bacteremia due to Gram-negative bacteria 05/23/2019  . Acute bronchiolitis 01/28/2019  . Left anterior knee pain 10/24/2016  . Right upper quadrant pain 10/24/2016  . Urination pain 06/26/2016  . DDD (degenerative disc disease), lumbar 05/14/2016   . Lipoma of skin 05/14/2016  . Vitamin D deficiency, unspecified 05/14/2016  . Major neurocognitive disorder, due to vascular disease, with behavioral disturbance, mild ([place]) 04/15/2016  . Severe recurrent major depression with psychotic features ([place]) 04/04/2016  . Lupus ([place]) 04/04/2016  . GERD (gastroesophageal reflux disease) 04/04/2016  . COPD with acute bronchitis ([place]) 04/04/2016  . Acute respiratory failure with hypoxia ([place]) 04/03/2016  . Encephalopathy, metabolic 0000000  . Hypokalemia 04/03/2016  . Pain in limb 04/03/2016  . Hypothyroidism 04/03/2016  . Essential hypertension 04/03/2016  . Intentional opiate overdose ([place]) 03/30/2016  . Age related osteoporosis 03/19/2016  . Chronic midline low back pain without sciatica 03/19/2016  . Encounter for long-term (current) use of high-risk medication 03/19/2016  . Primary osteoarthritis of both knees 03/19/2016    Past Surgical History:  Procedure Laterality Date  . APPENDECTOMY    . BREAST BIOPSY Left    neg  . BREAST LUMPECTOMY Left   . CESAREAN SECTION    . COLONOSCOPY WITH PROPOFOL N/A 07/01/2017   Procedure: COLONOSCOPY WITH PROPOFOL;  Surgeon: Manya Silvas, MD;  Location: [facility] ENDOSCOPY;  Service: Endoscopy;  Laterality: N/A;  . ESOPHAGOGASTRODUODENOSCOPY (EGD) WITH PROPOFOL N/A 07/01/2017   Procedure: ESOPHAGOGASTRODUODENOSCOPY (EGD) WITH PROPOFOL;  Surgeon: Manya Silvas, MD;  Location: [facility] ENDOSCOPY;  Service: Endoscopy;  Laterality: N/A;  . GASTRIC BYPASS    . TUBAL LIGATION      Prior to Admission medications   Medication Sig Start Date End  Date Taking? Authorizing Provider  acetaminophen (TYLENOL) 325 MG tablet Take 650 mg by mouth every 6 (six) hours as needed.    [provider]  albuterol (PROVENTIL HFA;VENTOLIN HFA) 108 (90 Base) MCG/ACT inhaler Inhale 2 puffs into the lungs every 6 (six) hours as needed for wheezing or shortness of breath.    [provider]  alendronate  (FOSAMAX) 70 MG tablet Take 70 mg by mouth once a week. Take with a full glass of water on an empty stomach.    [provider]  B Complex Vitamins (B COMPLEX PO) Take 1 tablet by mouth daily.     [provider]  busPIRone (BUSPAR) 10 MG tablet Take 10 mg by mouth daily.    [provider]  cefpodoxime (VANTIN) 200 MG tablet Take 1 tablet (200 mg total) by mouth 2 (two) times daily. 05/25/19   Annita Brod, MD  celecoxib (CELEBREX) 200 MG capsule Take 200 mg by mouth daily.     [provider]  chlorpheniramine-HYDROcodone (TUSSIONEX PENNKINETIC ER) 10-8 MG/5ML SUER Take 5 mLs by mouth 2 (two) times daily. 05/27/19   Earleen Newport, MD  cyclobenzaprine (FLEXERIL) 5 MG tablet Take 5 mg by mouth at bedtime as needed. 05/19/19   [provider]  diclofenac Sodium (VOLTAREN) 1 % GEL Apply 2 g topically 4 (four) times daily.  05/19/19   [provider]  docusate sodium (COLACE) 100 MG capsule Take 1 capsule (100 mg total) by mouth 2 (two) times daily. 01/30/19   Henreitta Leber, MD  DULoxetine (CYMBALTA) 60 MG capsule Take 1 capsule (60 mg total) by mouth daily. Patient taking differently: Take 30 mg by mouth daily.  04/17/16   Pucilowska, Herma Ard B, MD  famotidine (PEPCID) 20 MG tablet Take 1 tablet (20 mg total) by mouth 2 (two) times daily. Patient taking differently: Take 20 mg by mouth daily.  04/16/16   Pucilowska, Herma Ard B, MD  ferrous sulfate 325 (65 FE) MG tablet Take 325 mg by mouth daily.    [provider]  folic acid (FOLVITE) 1 MG tablet Take 1 mg by mouth daily.    [provider]  gabapentin (NEURONTIN) 800 MG tablet Take 800 mg by mouth 2 (two) times daily.    [provider]  HYDROcodone-acetaminophen (NORCO/VICODIN) 5-325 MG tablet Take 1 tablet by mouth 2 (two) times daily as needed. 05/20/19   [provider]  hydroxychloroquine (PLAQUENIL) 200 MG tablet Take 2 tablets (400 mg total) by  mouth daily. Patient taking differently: Take 200 mg by mouth 2 (two) times daily.  04/04/16   Theodoro Grist, MD  ibuprofen (ADVIL) 200 MG tablet Take 400-800 mg by mouth every 6 (six) hours as needed for fever, mild pain or moderate pain.    [provider]  ipratropium-albuterol (DUONEB) 0.5-2.5 (3) MG/3ML SOLN Take 3 mLs by nebulization every 6 (six) hours as needed (Wheezing). 01/30/19   Henreitta Leber, MD  isosorbide mononitrate (IMDUR) 30 MG 24 hr tablet Take 30 mg by mouth daily.    [provider]  levothyroxine (SYNTHROID, LEVOTHROID) 75 MCG tablet Take 75 mcg by mouth daily before breakfast.    [provider]  LORazepam (ATIVAN) 0.5 MG tablet Take 0.5 mg by mouth at bedtime.    [provider]  metoprolol succinate (TOPROL-XL) 25 MG 24 hr tablet Take 25 mg by mouth daily.     [provider]  OLANZapine (ZYPREXA) 15 MG tablet Take 1  tablet (15 mg total) by mouth at bedtime. 04/16/16   Pucilowska, Jolanta B, MD  pantoprazole (PROTONIX) 40 MG tablet Take 40 mg by mouth 2 (two) times daily.     [provider]  predniSONE (STERAPRED UNI-PAK 21 TAB) 10 MG (21) TBPK tablet Dispense steroid taper pack as directed 05/27/19   Earleen Newport, MD  traMADol (ULTRAM) 50 MG tablet Take 50 mg by mouth every 6 (six) hours as needed. 05/06/19   [provider]  traZODone (DESYREL) 100 MG tablet Take 100 mg by mouth at bedtime.    [provider]    Allergies Patient has no known allergies.  Family History  Problem Relation Age of Onset  . Breast cancer Maternal Aunt   . Breast cancer Cousin     Social History Social History   Tobacco Use  . Smoking status: Never Smoker  . Smokeless tobacco: Never Used  Substance Use Topics  . Alcohol use: No  . Drug use: No    Review of Systems  Constitutional: No fever.  Positive for weakness. Eyes: No visual changes. ENT: No sore throat.  No neck pain. Cardiovascular:  Positive for chest pain. Respiratory: Denies shortness of breath. Gastrointestinal: No vomiting or diarrhea.  Genitourinary: Positive for dysuria.  Musculoskeletal: Positive for back pain. Skin: Negative for rash. Neurological: Negative for headache.   ____________________________________________   PHYSICAL EXAM:  VITAL SIGNS: ED Triage Vitals  Enc Vitals Group     BP 06/02/19 1036 (!) 98/53     Pulse Rate 06/02/19 1036 71     Resp 06/02/19 1036 (!) 22     Temp 06/02/19 1036 98.3 F (36.8 C)     Temp Source 06/02/19 1036 Oral     SpO2 06/02/19 1036 94 %     Weight 06/02/19 1032 (!) 313 lb 0.9 oz (142 kg)     Height 06/02/19 1032 5\' 1"  (1.549 m)     Head Circumference --      Peak Flow --      Pain Score 06/02/19 1032 8     Pain Loc --      Pain Edu? --      Excl. in [place]? --     Constitutional: Alert and oriented.  Slightly weak appearing but in no acute distress. Eyes: Conjunctivae are normal.  EOMI.  PERRLA. Head: Atraumatic. Nose: No congestion/rhinnorhea. Mouth/Throat: Mucous membranes are dry.   Neck: Normal range of motion.  No midline cervical spinal tenderness. Cardiovascular: Normal rate, regular rhythm. Grossly normal heart sounds.  Good peripheral circulation. Respiratory: Normal respiratory effort.  No retractions. Lungs CTAB. Gastrointestinal: Soft and nontender. No distention.  Genitourinary: No flank tenderness. Musculoskeletal: No lower extremity edema.  Extremities warm and well perfused.  Neurologic:  Normal speech and language.  Motor intact in all extremities.  Normal coordination. Skin:  Skin is warm and dry. No rash noted. Psychiatric: Mood and affect are normal. Speech and behavior are normal.  ____________________________________________   LABS (all labs ordered are listed, but only abnormal results are displayed)  Labs Reviewed  COMPREHENSIVE METABOLIC PANEL - Abnormal; Notable for the following components:      Result Value   Calcium  8.5 (*)    Albumin 2.9 (*)    All other components within normal limits  CBC WITH DIFFERENTIAL/PLATELET - Abnormal; Notable for the following components:   WBC 3.9 (*)    All other components within normal limits  URINALYSIS, COMPLETE (UACMP) WITH MICROSCOPIC - Abnormal; Notable  for the following components:   Color, Urine YELLOW (*)    APPearance HAZY (*)    All other components within normal limits  TROPONIN I (HIGH SENSITIVITY)   ____________________________________________  EKG  ED ECG REPORT I, Arta Silence, the attending physician, personally viewed and interpreted this ECG.  Date: 06/02/2019 EKG Time: 1035 Rate: 72 Rhythm: normal sinus rhythm QRS Axis: normal Intervals: normal ST/T Wave abnormalities: normal Narrative Interpretation: no evidence of acute ischemia  ____________________________________________  RADIOLOGY  CXR: Bilateral infiltrates  ____________________________________________   PROCEDURES  Procedure(s) performed: No  Procedures  Critical Care performed: No ____________________________________________   INITIAL IMPRESSION / ASSESSMENT AND PLAN / ED COURSE  Pertinent labs & imaging results that were available during my care of the patient were reviewed by me and considered in my medical decision making (see chart for details).  69 year old female with PMH as noted above as well as a recent diagnosis of COVID-19 presents with a near syncopal event today while going to the bathroom to urinate.  The patient states that she did not completely pass out, but did fall and hit the back of her head.  She also reports some mild chest pain recently.  She has had a bit of a cough but denies fever or any other significant COVID-19 related symptoms that she is aware of.  I reviewed the past medical records in epic.  The patient was admitted earlier this month after she had 2 falls and then was on the ground for several hours before being found by a  family member.  She was diagnosed with a UTI with a concern for possible sepsis and E. coli bacteremia.  She was subsequently seen in the ED on 1/20 and had reassuring labs but was diagnosed with Covid at that time.  She was discharged with a steroid course.  On exam today, the patient is somewhat weak appearing but in no acute distress.  Her blood pressure was borderline low on arrival but has since improved.  Her vital signs are otherwise normal except for O2 saturation which is in the mid 90s on room air.  Neurologic exam is nonfocal.  Mucous membranes are dry and she does appear a bit dehydrated.  The remainder of the physical exam is unremarkable.  Differential includes symptoms directly related to COVID-19, dehydration, other metabolic etiology, recurrent UTI, or less likely cardiac cause.  The patient has no focal neurologic findings and there is no evidence of CNS cause.  We will obtain labs, chest x-ray, give fluids, and reassess.  ----------------------------------------- 3:23 PM on 06/02/2019 -----------------------------------------  The patient's lab work-up is unremarkable other than a borderline low WBC count, however her electrolytes are normal and the troponin is negative.  Given that she has had chest discomfort for several days and her EKG is normal, there is no evidence of ACS and no indication for a repeat. The patient's urinalysis is also negative for acute findings.  Chest x-ray shows progressive bilateral infiltrates consistent with COVID-19.  Given that the patient has no elevated WBC count or other findings that would suggest bacterial etiology or sepsis, there is no indication for antibiotics.  On reassessment, the patient's blood pressure is markedly improved and has remained stable for several hours.  Her O2 saturation is consistently around 94 to 96% on room air.  The patient feels comfortable and would like to go home.  Given that she is not requiring supplemental oxygen  and has no significant increased work of breathing or concerning  lab findings, she is appropriate for discharge at this time.  I counseled her on the results of the work-up and the plan of care.  I instructed her to continue the prednisone that she was prescribed, and drink plenty of fluids and get some rest.  I gave her very thorough return precautions and she expressed understanding.  ____________________________________________   FINAL CLINICAL IMPRESSION(S) / ED DIAGNOSES  Final diagnoses:  Near syncope  Dehydration  COVID-19      NEW MEDICATIONS STARTED DURING THIS VISIT:  New Prescriptions   No medications on file     Note:  This document was prepared using Dragon voice recognition software and may include unintentional dictation errors.    Arta Silence, MD 06/02/19 1526

## 2019-06-09 ENCOUNTER — Other Ambulatory Visit: Payer: Self-pay

## 2019-06-09 ENCOUNTER — Encounter: Payer: Self-pay | Admitting: Intensive Care

## 2019-06-09 ENCOUNTER — Emergency Department: Payer: Medicare Other

## 2019-06-09 ENCOUNTER — Emergency Department
Admission: EM | Admit: 2019-06-09 | Discharge: 2019-06-09 | Disposition: A | Discharge status: Home / Self Care | Payer: Medicare Other | Attending: Emergency Medicine | Admitting: Emergency Medicine

## 2019-06-09 DIAGNOSIS — Z79899 Other long term (current) drug therapy: Secondary | ICD-10-CM | POA: Diagnosis not present

## 2019-06-09 DIAGNOSIS — E039 Hypothyroidism, unspecified: Secondary | ICD-10-CM | POA: Diagnosis not present

## 2019-06-09 DIAGNOSIS — R079 Chest pain, unspecified: Secondary | ICD-10-CM | POA: Diagnosis not present

## 2019-06-09 DIAGNOSIS — I10 Essential (primary) hypertension: Secondary | ICD-10-CM | POA: Insufficient documentation

## 2019-06-09 DIAGNOSIS — R0602 Shortness of breath: Secondary | ICD-10-CM | POA: Insufficient documentation

## 2019-06-09 DIAGNOSIS — M3210 Systemic lupus erythematosus, organ or system involvement unspecified: Secondary | ICD-10-CM | POA: Diagnosis not present

## 2019-06-09 DIAGNOSIS — R5383 Other fatigue: Secondary | ICD-10-CM | POA: Insufficient documentation

## 2019-06-09 DIAGNOSIS — R0789 Other chest pain: Secondary | ICD-10-CM

## 2019-06-09 LAB — BASIC METABOLIC PANEL
Anion gap: 9 (ref 5–15)
BUN: 12 mg/dL (ref 8–23)
CO2: 24 mmol/L (ref 22–32)
Calcium: 9 mg/dL (ref 8.9–10.3)
Chloride: 107 mmol/L (ref 98–111)
Creatinine, Ser: 0.79 mg/dL (ref 0.44–1.00)
GFR calc Af Amer: >60 mL/min (ref >60)
GFR calc non Af Amer: >60 mL/min (ref >60)
Glucose, Bld: 96 mg/dL (ref 70–99)
Potassium: 4.8 mmol/L (ref 3.5–5.1)
Sodium: 140 mmol/L (ref 135–145)

## 2019-06-09 LAB — CBC
HCT: 43.1 % (ref 36.0–46.0)
Hemoglobin: 13.2 g/dL (ref 12.0–15.0)
MCH: 29.2 pg (ref 26.0–34.0)
MCHC: 30.6 g/dL (ref 30.0–36.0)
MCV: 95.4 fL (ref 80.0–100.0)
Platelets: 304 10*3/uL (ref 150–400)
RBC: 4.52 MIL/uL (ref 3.87–5.11)
RDW: 14.5 % (ref 11.5–15.5)
WBC: 6.9 10*3/uL (ref 4.0–10.5)
nRBC: 0 % (ref 0.0–0.2)

## 2019-06-09 LAB — TROPONIN I (HIGH SENSITIVITY)
Troponin I (High Sensitivity): <2 ng/L (ref <18)
Troponin I (High Sensitivity): <2 ng/L (ref <18)

## 2019-06-09 MED ORDER — ALBUTEROL SULFATE HFA 108 (90 BASE) MCG/ACT IN AERS: 2.0000 | INHALATION_SPRAY | Freq: Four times a day (QID) | RESPIRATORY_TRACT | 0 refills | Status: DC | PRN

## 2019-06-09 MED ORDER — TRAMADOL HCL 50 MG PO TABS
50.0000 mg | ORAL_TABLET | Freq: Once | ORAL | Status: AC
  Administered 2019-06-09: 50 mg via ORAL
  Filled 2019-06-09: qty 1

## 2019-06-09 MED ORDER — TRAMADOL HCL 50 MG PO TABS: 50.0000 mg | ORAL_TABLET | Freq: Four times a day (QID) | ORAL | 0 refills | Status: AC | PRN

## 2019-06-09 NOTE — ED Notes (Signed)
Per pt caregiver present, states the pt was retested through [facility] health department and was covid -, states other family from the household also tested negative for covid.

## 2019-06-09 NOTE — ED Notes (Signed)
Pt's granddaughter and patient verbalize discharge instructions and understanding, no questions at this time.

## 2019-06-09 NOTE — ED Provider Notes (Signed)
[facility] Emergency Department Provider Note ____________________________________________   First MD Initiated Contact with Patient 06/09/19 1550     (approximate)  I have reviewed the triage vital signs and the nursing notes.   HISTORY  Chief Complaint No chief complaint on file.    HPI Shelly Freeman is a 69 y.o. female with PMH as noted below and a recent diagnosis of COVID-19 who presents referred from her rheumatology office for persistent shortness of breath and body aches.  The patient states that the symptoms have persisted since she was diagnosed with Covid around 2 weeks ago, but have not worsened.  She reports some ongoing intermittent chest pain, a mild cough, and diarrhea, but denies any worsening symptoms.  Past Medical History:  Diagnosis Date  . Arthritis   . Collagen vascular disease ([place])   . DDD (degenerative disc disease), lumbar 05/14/2016  . Depression   . Heart valve problem    per grandaughter need surgery but they told her may not survive;  [name] Hypertension   . Hypothyroidism   . Lupus ([place])   . Neuromuscular disorder ([place])   . Thyroid disease   . Vitamin D deficiency 05/14/2016    Patient Active Problem List   Diagnosis Date Noted  . Morbid obesity ([place]) 05/23/2019  . Acute lower UTI 05/23/2019  . Right rib fracture 05/23/2019  . SIRS (systemic inflammatory response syndrome) ([place]) 05/23/2019  . Bacteremia due to Gram-negative bacteria 05/23/2019  . Acute bronchiolitis 01/28/2019  . Left anterior knee pain 10/24/2016  . Right upper quadrant pain 10/24/2016  . Urination pain 06/26/2016  . DDD (degenerative disc disease), lumbar 05/14/2016  . Lipoma of skin 05/14/2016  . Vitamin D deficiency, unspecified 05/14/2016  . Major neurocognitive disorder, due to vascular disease, with behavioral disturbance, mild ([place]) 04/15/2016  . Severe recurrent major depression with psychotic features ([place]) 04/04/2016  . Lupus ([place])  04/04/2016  . GERD (gastroesophageal reflux disease) 04/04/2016  . COPD with acute bronchitis ([place]) 04/04/2016  . Acute respiratory failure with hypoxia ([place]) 04/03/2016  . Encephalopathy, metabolic 0000000  . Hypokalemia 04/03/2016  . Pain in limb 04/03/2016  . Hypothyroidism 04/03/2016  . Essential hypertension 04/03/2016  . Intentional opiate overdose ([place]) 03/30/2016  . Age related osteoporosis 03/19/2016  . Chronic midline low back pain without sciatica 03/19/2016  . Encounter for long-term (current) use of high-risk medication 03/19/2016  . Primary osteoarthritis of both knees 03/19/2016    Past Surgical History:  Procedure Laterality Date  . APPENDECTOMY    . BREAST BIOPSY Left    neg  . BREAST LUMPECTOMY Left   . CESAREAN SECTION    . COLONOSCOPY WITH PROPOFOL N/A 07/01/2017   Procedure: COLONOSCOPY WITH PROPOFOL;  Surgeon: Manya Silvas, MD;  Location: [facility] ENDOSCOPY;  Service: Endoscopy;  Laterality: N/A;  . ESOPHAGOGASTRODUODENOSCOPY (EGD) WITH PROPOFOL N/A 07/01/2017   Procedure: ESOPHAGOGASTRODUODENOSCOPY (EGD) WITH PROPOFOL;  Surgeon: Manya Silvas, MD;  Location: [facility] ENDOSCOPY;  Service: Endoscopy;  Laterality: N/A;  . GASTRIC BYPASS    . TUBAL LIGATION      Prior to Admission medications   Medication Sig Start Date End Date Taking? Authorizing Provider  acetaminophen (TYLENOL) 325 MG tablet Take 650 mg by mouth every 6 (six) hours as needed.    [provider]  albuterol (VENTOLIN HFA) 108 (90 Base) MCG/ACT inhaler Inhale 2 puffs into the lungs every 6 (six) hours as needed for wheezing or shortness of breath. 06/09/19   Arta Silence,  MD  alendronate (FOSAMAX) 70 MG tablet Take 70 mg by mouth once a week. Take with a full glass of water on an empty stomach.    [provider]  B Complex Vitamins (B COMPLEX PO) Take 1 tablet by mouth daily.     [provider]  busPIRone (BUSPAR) 10 MG tablet Take 10 mg by mouth daily.     [provider]  cefpodoxime (VANTIN) 200 MG tablet Take 1 tablet (200 mg total) by mouth 2 (two) times daily. 05/25/19   Annita Brod, MD  celecoxib (CELEBREX) 200 MG capsule Take 200 mg by mouth daily.     [provider]  chlorpheniramine-HYDROcodone (TUSSIONEX PENNKINETIC ER) 10-8 MG/5ML SUER Take 5 mLs by mouth 2 (two) times daily. 05/27/19   Earleen Newport, MD  cyclobenzaprine (FLEXERIL) 5 MG tablet Take 5 mg by mouth at bedtime as needed. 05/19/19   [provider]  diclofenac Sodium (VOLTAREN) 1 % GEL Apply 2 g topically 4 (four) times daily.  05/19/19   [provider]  docusate sodium (COLACE) 100 MG capsule Take 1 capsule (100 mg total) by mouth 2 (two) times daily. 01/30/19   Henreitta Leber, MD  DULoxetine (CYMBALTA) 60 MG capsule Take 1 capsule (60 mg total) by mouth daily. Patient taking differently: Take 30 mg by mouth daily.  04/17/16   Pucilowska, Herma Ard B, MD  famotidine (PEPCID) 20 MG tablet Take 1 tablet (20 mg total) by mouth 2 (two) times daily. Patient taking differently: Take 20 mg by mouth daily.  04/16/16   Pucilowska, Herma Ard B, MD  ferrous sulfate 325 (65 FE) MG tablet Take 325 mg by mouth daily.    [provider]  folic acid (FOLVITE) 1 MG tablet Take 1 mg by mouth daily.    [provider]  gabapentin (NEURONTIN) 800 MG tablet Take 800 mg by mouth 2 (two) times daily.    [provider]  HYDROcodone-acetaminophen (NORCO/VICODIN) 5-325 MG tablet Take 1 tablet by mouth 2 (two) times daily as needed. 05/20/19   [provider]  hydroxychloroquine (PLAQUENIL) 200 MG tablet Take 2 tablets (400 mg total) by mouth daily. Patient taking differently: Take 200 mg by mouth 2 (two) times daily.  04/04/16   Theodoro Grist, MD  ibuprofen (ADVIL) 200 MG tablet Take 400-800 mg by mouth every 6 (six) hours as needed for fever, mild pain or moderate pain.    [provider]  ipratropium-albuterol  (DUONEB) 0.5-2.5 (3) MG/3ML SOLN Take 3 mLs by nebulization every 6 (six) hours as needed (Wheezing). 01/30/19   Henreitta Leber, MD  isosorbide mononitrate (IMDUR) 30 MG 24 hr tablet Take 30 mg by mouth daily.    [provider]  levothyroxine (SYNTHROID, LEVOTHROID) 75 MCG tablet Take 75 mcg by mouth daily before breakfast.    [provider]  LORazepam (ATIVAN) 0.5 MG tablet Take 0.5 mg by mouth at bedtime.    [provider]  metoprolol succinate (TOPROL-XL) 25 MG 24 hr tablet Take 25 mg by mouth daily.     [provider]  OLANZapine (ZYPREXA) 15 MG tablet Take 1 tablet (15 mg total) by mouth at bedtime. 04/16/16   Pucilowska, Jolanta B, MD  pantoprazole (PROTONIX) 40 MG tablet Take 40 mg by mouth 2 (two) times daily.     [provider]  predniSONE (STERAPRED UNI-PAK 21 TAB) 10 MG (21) TBPK tablet Dispense steroid taper pack as directed 05/27/19   Earleen Newport,  MD  traMADol (ULTRAM) 50 MG tablet Take 1 tablet (50 mg total) by mouth every 6 (six) hours as needed for up to 5 days. 06/09/19 06/14/19  Arta Silence, MD  traZODone (DESYREL) 100 MG tablet Take 100 mg by mouth at bedtime.    [provider]    Allergies Patient has no known allergies.  Family History  Problem Relation Age of Onset  . Breast cancer Maternal Aunt   . Breast cancer Cousin     Social History Social History   Tobacco Use  . Smoking status: Never Smoker  . Smokeless tobacco: Never Used  Substance Use Topics  . Alcohol use: No  . Drug use: No    Review of Systems  Constitutional: No fever. Eyes: No redness. ENT: No sore throat. Cardiovascular: Positive for chest pain. Respiratory: Positive for shortness of breath. Gastrointestinal: No vomiting.  Positive for diarrhea. Genitourinary: Negative for flank pain. Musculoskeletal: Negative for back pain. Skin: Negative for rash. Neurological: Negative for  headache.   ____________________________________________   PHYSICAL EXAM:  VITAL SIGNS: ED Triage Vitals  Enc Vitals Group     BP 06/09/19 1350 98/61     Pulse Rate 06/09/19 1350 87     Resp 06/09/19 1350 18     Temp 06/09/19 1350 98.4 F (36.9 C)     Temp Source 06/09/19 1350 Oral     SpO2 06/09/19 1350 94 %     Weight 06/09/19 1343 223 lb (101.2 kg)     Height 06/09/19 1343 5\' 1"  (1.549 m)     Head Circumference --      Peak Flow --      Pain Score 06/09/19 1343 8     Pain Loc --      Pain Edu? --      Excl. in [place]? --     Constitutional: Alert and oriented.  Relatively well appearing and in no acute distress. Eyes: Conjunctivae are normal.  Head: Atraumatic. Nose: No congestion/rhinnorhea. Mouth/Throat: Mucous membranes are moist.   Neck: Normal range of motion.  Cardiovascular: Normal rate, regular rhythm. Grossly normal heart sounds.  Good peripheral circulation. Respiratory: Normal respiratory effort.  No retractions. Lungs CTAB. Gastrointestinal: No distention.  Musculoskeletal:  Extremities warm and well perfused.  Neurologic:  Normal speech and language. No gross focal neurologic deficits are appreciated.  Skin:  Skin is warm and dry. No rash noted. Psychiatric: Mood and affect are normal. Speech and behavior are normal.  ____________________________________________   LABS (all labs ordered are listed, but only abnormal results are displayed)  Labs Reviewed  BASIC METABOLIC PANEL  CBC  TROPONIN I (HIGH SENSITIVITY)  TROPONIN I (HIGH SENSITIVITY)   ____________________________________________  EKG  ED ECG REPORT I, Arta Silence, the attending physician, personally viewed and interpreted this ECG.  Date: 06/09/2019 EKG Time: 1356 Rate: 87 Rhythm: normal sinus rhythm QRS Axis: normal Intervals: normal ST/T Wave abnormalities: normal Narrative Interpretation: no evidence of acute  ischemia  ____________________________________________  RADIOLOGY  CXR: Bilateral opacities left more than right, with no significant change when compared to x-ray of 1/26  ____________________________________________   PROCEDURES  Procedure(s) performed: No  Procedures  Critical Care performed: No ____________________________________________   INITIAL IMPRESSION / ASSESSMENT AND PLAN / ED COURSE  Pertinent labs & imaging results that were available during my care of the patient were reviewed by me and considered in my medical decision making (see chart for details).  69 year old female with PMH as noted above presents today with  her caregiver after she was instructed at her rheumatology appointment to come to the ER due to persistent shortness of breath and body aches after a recent bout with COVID-19.  The patient presents with her caregiver, who interpreted per the patient's request.  Patient also does comprehend and speak some [place].  I reviewed the past medical records in Epic; I actually saw this patient on 1/26 in the ED.  She was initially diagnosed on 1/20, and was treated with a course of steroids and albuterol.  She did not end up requiring supplemental oxygen or admission.  Per the caregiver, she has subsequently tested negative.  On exam today, the patient is overall relatively well-appearing.  She had a borderline low blood pressure when she arrived, but this is now normal.  Her other vital signs are normal.  O2 saturation is in the mid 90s on room air and the patient has no increased work of breathing or respiratory distress.  Initial lab work-up is unremarkable.  A repeat troponin is pending.  The chest x-ray shows no significant change when compared to her last x-ray from 1/26.  Overall presentation is consistent with ongoing symptoms related to the recent Covid infection.  Given that the patient still does not require supplemental oxygen and has an otherwise  reassuring work-up, she will be stable for discharge home pending the repeat troponin.  I will prescribe some additional albuterol since the patient ran out and states it was helpful.  ----------------------------------------- 5:04 PM on 06/09/2019 -----------------------------------------  Repeat troponin is still negative.  The patient is stable for discharge home.  Return precautions provided, and she and the caregiver expressed understanding.  ____________________________________________   FINAL CLINICAL IMPRESSION(S) / ED DIAGNOSES  Final diagnoses:  Atypical chest pain      NEW MEDICATIONS STARTED DURING THIS VISIT:  New Prescriptions   ALBUTEROL (VENTOLIN HFA) 108 (90 BASE) MCG/ACT INHALER    Inhale 2 puffs into the lungs every 6 (six) hours as needed for wheezing or shortness of breath.   TRAMADOL (ULTRAM) 50 MG TABLET    Take 1 tablet (50 mg total) by mouth every 6 (six) hours as needed for up to 5 days.     Note:  This document was prepared using Dragon voice recognition software and may include unintentional dictation errors.    Arta Silence, MD 06/09/19 1705

## 2019-06-09 NOTE — ED Triage Notes (Addendum)
PSS (peer support specialist) worker presents with patient reporting [place] wants a chest Xray due to SOB and cough. SOB worse with exertion. HX broke ribs from recent falls. Diagnosed COVID + 05/27/19.

## 2019-09-02 ENCOUNTER — Emergency Department: Payer: Medicare Other

## 2019-09-02 ENCOUNTER — Other Ambulatory Visit: Payer: Self-pay

## 2019-09-02 ENCOUNTER — Emergency Department
Admission: EM | Admit: 2019-09-02 | Discharge: 2019-09-03 | Disposition: A | Discharge status: Home / Self Care | Payer: Medicare Other | Attending: Emergency Medicine | Admitting: Emergency Medicine

## 2019-09-02 DIAGNOSIS — I10 Essential (primary) hypertension: Secondary | ICD-10-CM | POA: Diagnosis not present

## 2019-09-02 DIAGNOSIS — N1339 Other hydronephrosis: Secondary | ICD-10-CM | POA: Diagnosis not present

## 2019-09-02 DIAGNOSIS — J449 Chronic obstructive pulmonary disease, unspecified: Secondary | ICD-10-CM | POA: Diagnosis not present

## 2019-09-02 DIAGNOSIS — E039 Hypothyroidism, unspecified: Secondary | ICD-10-CM | POA: Insufficient documentation

## 2019-09-02 DIAGNOSIS — Z79899 Other long term (current) drug therapy: Secondary | ICD-10-CM | POA: Insufficient documentation

## 2019-09-02 DIAGNOSIS — R197 Diarrhea, unspecified: Secondary | ICD-10-CM | POA: Diagnosis not present

## 2019-09-02 DIAGNOSIS — R112 Nausea with vomiting, unspecified: Secondary | ICD-10-CM | POA: Insufficient documentation

## 2019-09-02 LAB — COMPREHENSIVE METABOLIC PANEL
ALT: 16 U/L (ref 0–44)
AST: 20 U/L (ref 15–41)
Albumin: 4 g/dL (ref 3.5–5.0)
Alkaline Phosphatase: 74 U/L (ref 38–126)
Anion gap: 8 (ref 5–15)
BUN: 15 mg/dL (ref 8–23)
CO2: 25 mmol/L (ref 22–32)
Calcium: 9.3 mg/dL (ref 8.9–10.3)
Chloride: 108 mmol/L (ref 98–111)
Creatinine, Ser: 0.67 mg/dL (ref 0.44–1.00)
GFR calc Af Amer: >60 mL/min (ref >60)
GFR calc non Af Amer: >60 mL/min (ref >60)
Glucose, Bld: 110 mg/dL — ABNORMAL HIGH (ref 70–99)
Potassium: 4.6 mmol/L (ref 3.5–5.1)
Sodium: 141 mmol/L (ref 135–145)
Total Bilirubin: 0.5 mg/dL (ref 0.3–1.2)
Total Protein: 7.3 g/dL (ref 6.5–8.1)

## 2019-09-02 LAB — CBC
HCT: 46.2 % — ABNORMAL HIGH (ref 36.0–46.0)
Hemoglobin: 15.1 g/dL — ABNORMAL HIGH (ref 12.0–15.0)
MCH: 30.6 pg (ref 26.0–34.0)
MCHC: 32.7 g/dL (ref 30.0–36.0)
MCV: 93.5 fL (ref 80.0–100.0)
Platelets: 201 10*3/uL (ref 150–400)
RBC: 4.94 MIL/uL (ref 3.87–5.11)
RDW: 14.6 % (ref 11.5–15.5)
WBC: 7.9 10*3/uL (ref 4.0–10.5)
nRBC: 0 % (ref 0.0–0.2)

## 2019-09-02 LAB — LIPASE, BLOOD: Lipase: 19 U/L (ref 11–51)

## 2019-09-02 MED ORDER — ONDANSETRON HCL 4 MG/2ML IJ SOLN
4.0000 mg | Freq: Once | INTRAMUSCULAR | Status: AC
  Administered 2019-09-02: 4 mg via INTRAVENOUS
  Filled 2019-09-02: qty 2

## 2019-09-02 MED ORDER — SODIUM CHLORIDE 0.9 % IV BOLUS
1000.0000 mL | Freq: Once | INTRAVENOUS | Status: AC
  Administered 2019-09-02: 1000 mL via INTRAVENOUS

## 2019-09-02 MED ORDER — KETOROLAC TROMETHAMINE 30 MG/ML IJ SOLN
15.0000 mg | Freq: Once | INTRAMUSCULAR | Status: AC
  Administered 2019-09-02: 15 mg via INTRAVENOUS
  Filled 2019-09-02: qty 1

## 2019-09-02 MED ORDER — IOHEXOL 300 MG/ML  SOLN
100.0000 mL | Freq: Once | INTRAMUSCULAR | Status: AC | PRN
  Administered 2019-09-02: 100 mL via INTRAVENOUS

## 2019-09-02 MED ORDER — SODIUM CHLORIDE 0.9% FLUSH
3.0000 mL | Freq: Once | INTRAVENOUS | Status: AC
  Administered 2019-09-02: 3 mL via INTRAVENOUS

## 2019-09-02 MED ORDER — MORPHINE SULFATE (PF) 4 MG/ML IV SOLN
4.0000 mg | Freq: Once | INTRAVENOUS | Status: AC
  Administered 2019-09-02: 4 mg via INTRAVENOUS
  Filled 2019-09-02: qty 1

## 2019-09-02 NOTE — ED Notes (Signed)
Pt given a tray of food and encouraged to eat.

## 2019-09-02 NOTE — ED Triage Notes (Signed)
First RN Note: Pt presents to ED via POV with c/o diarrhea, dizziness and blurred vision. Pt presents ambulatory to triage desk, placed in wheelchair at this time.

## 2019-09-02 NOTE — ED Notes (Signed)
PT attempted to get a urine sample, but was unable to get urine in the [facility] on the toilet.  Urine in toilet bowl was light yellow in color.

## 2019-09-02 NOTE — ED Triage Notes (Signed)
Pt comes via POV from home with c/o N/V/D that started a week ago. Pt states leg muscle cramps.  Pt states she has had both her covid vaccines  Pt states chills and lower belly pain. Pt denies any CP or SOB,

## 2019-09-02 NOTE — ED Notes (Signed)
Pt states 3 days ago she started to have symptoms of leg pain, nausea, vomiting and blurred vision. Pt states nothing in the stomach. Pt also endorses headache and diarrhea. Pt states fever yesterday. Pt states headache at the area of the temples. Pt states blurred vision for 5 days. Pt states she got the second covid shot a week ago.   Hospital translator used.

## 2019-09-02 NOTE — ED Provider Notes (Addendum)
[facility] Emergency Department Provider Note  ____________________________________________   First MD Initiated Contact with Patient 09/02/19 2023     (approximate)  I have reviewed the triage vital signs and the nursing notes.   HISTORY  Chief Complaint Nausea, Emesis, and Diarrhea    HPI Shelly Freeman is a 69 y.o. female  With PMHx HTN, HLD, SLE, here with abdominal pain, nausea, vomiting. Pt had her second vaccine dose this week and has since had diffuse n/v/d. She has been fatigued and had difficulty eating and drinking. No dysuria, flank pain. No fevers or chills. No cough or SOB. No other complaints. No specific alleviating factors. Nausea is worth with eating, drinking. She does endorse some mild blurred vision when she stands, no persistent sx.       Past Medical History:  Diagnosis Date  . Arthritis   . Collagen vascular disease ([place])   . DDD (degenerative disc disease), lumbar 05/14/2016  . Depression   . Heart valve problem    per grandaughter need surgery but they told her may not survive;  [name] Hypertension   . Hypothyroidism   . Lupus ([place])   . Neuromuscular disorder ([place])   . Thyroid disease   . Vitamin D deficiency 05/14/2016    Patient Active Problem List   Diagnosis Date Noted  . Morbid obesity ([place]) 05/23/2019  . Acute lower UTI 05/23/2019  . Right rib fracture 05/23/2019  . SIRS (systemic inflammatory response syndrome) ([place]) 05/23/2019  . Bacteremia due to Gram-negative bacteria 05/23/2019  . Acute bronchiolitis 01/28/2019  . Left anterior knee pain 10/24/2016  . Right upper quadrant pain 10/24/2016  . Urination pain 06/26/2016  . DDD (degenerative disc disease), lumbar 05/14/2016  . Lipoma of skin 05/14/2016  . Vitamin D deficiency, unspecified 05/14/2016  . Major neurocognitive disorder, due to vascular disease, with behavioral disturbance, mild ([place]) 04/15/2016  . Severe recurrent major depression with psychotic  features ([place]) 04/04/2016  . Lupus ([place]) 04/04/2016  . GERD (gastroesophageal reflux disease) 04/04/2016  . COPD with acute bronchitis ([place]) 04/04/2016  . Acute respiratory failure with hypoxia ([place]) 04/03/2016  . Encephalopathy, metabolic 0000000  . Hypokalemia 04/03/2016  . Pain in limb 04/03/2016  . Hypothyroidism 04/03/2016  . Essential hypertension 04/03/2016  . Intentional opiate overdose ([place]) 03/30/2016  . Age related osteoporosis 03/19/2016  . Chronic midline low back pain without sciatica 03/19/2016  . Encounter for long-term (current) use of high-risk medication 03/19/2016  . Primary osteoarthritis of both knees 03/19/2016    Past Surgical History:  Procedure Laterality Date  . APPENDECTOMY    . BREAST BIOPSY Left    neg  . BREAST LUMPECTOMY Left   . CESAREAN SECTION    . COLONOSCOPY WITH PROPOFOL N/A 07/01/2017   Procedure: COLONOSCOPY WITH PROPOFOL;  Surgeon: Manya Silvas, MD;  Location: [facility] ENDOSCOPY;  Service: Endoscopy;  Laterality: N/A;  . ESOPHAGOGASTRODUODENOSCOPY (EGD) WITH PROPOFOL N/A 07/01/2017   Procedure: ESOPHAGOGASTRODUODENOSCOPY (EGD) WITH PROPOFOL;  Surgeon: Manya Silvas, MD;  Location: [facility] ENDOSCOPY;  Service: Endoscopy;  Laterality: N/A;  . GASTRIC BYPASS    . TUBAL LIGATION      Prior to Admission medications   Medication Sig Start Date End Date Taking? Authorizing Provider  acetaminophen (TYLENOL) 325 MG tablet Take 650 mg by mouth every 6 (six) hours as needed.    [provider]  albuterol (VENTOLIN HFA) 108 (90 Base) MCG/ACT inhaler Inhale 2 puffs into the lungs every 6 (six) hours as  needed for wheezing or shortness of breath. 06/09/19   Arta Silence, MD  alendronate (FOSAMAX) 70 MG tablet Take 70 mg by mouth once a week. Take with a full glass of water on an empty stomach.    [provider]  B Complex Vitamins (B COMPLEX PO) Take 1 tablet by mouth daily.     [provider]  busPIRone (BUSPAR) 10  MG tablet Take 10 mg by mouth daily.    [provider]  cefpodoxime (VANTIN) 200 MG tablet Take 1 tablet (200 mg total) by mouth 2 (two) times daily. 05/25/19   Annita Brod, MD  celecoxib (CELEBREX) 200 MG capsule Take 200 mg by mouth daily.     [provider]  chlorpheniramine-HYDROcodone (TUSSIONEX PENNKINETIC ER) 10-8 MG/5ML SUER Take 5 mLs by mouth 2 (two) times daily. 05/27/19   Earleen Newport, MD  cyclobenzaprine (FLEXERIL) 5 MG tablet Take 5 mg by mouth at bedtime as needed. 05/19/19   [provider]  diclofenac Sodium (VOLTAREN) 1 % GEL Apply 2 g topically 4 (four) times daily.  05/19/19   [provider]  dicyclomine (BENTYL) 10 MG capsule Take 1 capsule (10 mg total) by mouth 4 (four) times daily -  before meals and at bedtime for 5 days. 09/03/19 09/08/19  Duffy Bruce, MD  docusate sodium (COLACE) 100 MG capsule Take 1 capsule (100 mg total) by mouth 2 (two) times daily. 01/30/19   Henreitta Leber, MD  DULoxetine (CYMBALTA) 60 MG capsule Take 1 capsule (60 mg total) by mouth daily. Patient taking differently: Take 30 mg by mouth daily.  04/17/16   Pucilowska, Herma Ard B, MD  famotidine (PEPCID) 20 MG tablet Take 1 tablet (20 mg total) by mouth 2 (two) times daily. Patient taking differently: Take 20 mg by mouth daily.  04/16/16   Pucilowska, Herma Ard B, MD  ferrous sulfate 325 (65 FE) MG tablet Take 325 mg by mouth daily.    [provider]  folic acid (FOLVITE) 1 MG tablet Take 1 mg by mouth daily.    [provider]  gabapentin (NEURONTIN) 800 MG tablet Take 800 mg by mouth 2 (two) times daily.    [provider]  HYDROcodone-acetaminophen (NORCO/VICODIN) 5-325 MG tablet Take 1 tablet by mouth 2 (two) times daily as needed. 05/20/19   [provider]  hydroxychloroquine (PLAQUENIL) 200 MG tablet Take 2 tablets (400 mg total) by mouth daily. Patient taking differently: Take 200 mg by mouth 2 (two) times  daily.  04/04/16   Theodoro Grist, MD  ibuprofen (ADVIL) 200 MG tablet Take 400-800 mg by mouth every 6 (six) hours as needed for fever, mild pain or moderate pain.    [provider]  ipratropium-albuterol (DUONEB) 0.5-2.5 (3) MG/3ML SOLN Take 3 mLs by nebulization every 6 (six) hours as needed (Wheezing). 01/30/19   Henreitta Leber, MD  isosorbide mononitrate (IMDUR) 30 MG 24 hr tablet Take 30 mg by mouth daily.    [provider]  levothyroxine (SYNTHROID, LEVOTHROID) 75 MCG tablet Take 75 mcg by mouth daily before breakfast.    [provider]  LORazepam (ATIVAN) 0.5 MG tablet Take 0.5 mg by mouth at bedtime.    [provider]  metoprolol succinate (TOPROL-XL) 25 MG 24 hr tablet Take 25 mg by mouth daily.     [provider]  OLANZapine (ZYPREXA) 15 MG tablet Take 1 tablet (15 mg total) by mouth at bedtime. 04/16/16   Pucilowska, Jolanta B,  MD  ondansetron (ZOFRAN ODT) 4 MG disintegrating tablet Take 1 tablet (4 mg total) by mouth every 8 (eight) hours as needed for nausea or vomiting. 09/03/19   Duffy Bruce, MD  pantoprazole (PROTONIX) 40 MG tablet Take 40 mg by mouth 2 (two) times daily.     [provider]  predniSONE (STERAPRED UNI-PAK 21 TAB) 10 MG (21) TBPK tablet Dispense steroid taper pack as directed 05/27/19   Earleen Newport, MD  traZODone (DESYREL) 100 MG tablet Take 100 mg by mouth at bedtime.    [provider]    Allergies Patient has no known allergies.  Family History  Problem Relation Age of Onset  . Breast cancer Maternal Aunt   . Breast cancer Cousin     Social History Social History   Tobacco Use  . Smoking status: Never Smoker  . Smokeless tobacco: Never Used  Substance Use Topics  . Alcohol use: No  . Drug use: No    Review of Systems  Review of Systems  Constitutional: Positive for fatigue. Negative for fever.  HENT: Negative for congestion and sore throat.   Eyes: Negative for  visual disturbance.  Respiratory: Negative for cough and shortness of breath.   Cardiovascular: Negative for chest pain.  Gastrointestinal: Positive for nausea and vomiting. Negative for abdominal pain and diarrhea.  Genitourinary: Negative for flank pain.  Musculoskeletal: Negative for back pain and neck pain.  Skin: Negative for rash and wound.  Neurological: Positive for weakness.  All other systems reviewed and are negative.    ____________________________________________  PHYSICAL EXAM:      VITAL SIGNS: ED Triage Vitals [09/02/19 1808]  Enc Vitals Group     BP (!) 151/52     Pulse Rate 68     Resp 18     Temp 98 F (36.7 C)     Temp src      SpO2 97 %     Weight 189 lb (85.7 kg)     Height 5\' 1"  (1.549 m)     Head Circumference      Peak Flow      Pain Score 6     Pain Loc      Pain Edu?      Excl. in [place]?      Physical Exam Vitals and nursing note reviewed.  Constitutional:      General: She is not in acute distress.    Appearance: She is well-developed.  HENT:     Head: Normocephalic and atraumatic.     Mouth/Throat:     Mouth: Mucous membranes are dry.  Eyes:     Conjunctiva/sclera: Conjunctivae normal.  Cardiovascular:     Rate and Rhythm: Normal rate and regular rhythm.     Heart sounds: Normal heart sounds. No murmur. No friction rub.  Pulmonary:     Effort: Pulmonary effort is normal. No respiratory distress.     Breath sounds: Normal breath sounds. No wheezing or rales.  Abdominal:     General: There is no distension.     Palpations: Abdomen is soft.     Tenderness: There is abdominal tenderness.  Musculoskeletal:     Cervical back: Neck supple.  Skin:    General: Skin is warm.     Capillary Refill: Capillary refill takes less than 2 seconds.  Neurological:     Mental Status: She is alert and oriented to person, place, and time.     Motor: No abnormal muscle tone.  Comments: Vision intact on my exam, CN intact. Strength 5/5 bl UE and  LE. Normal sensation.       ____________________________________________   LABS (all labs ordered are listed, but only abnormal results are displayed)  Labs Reviewed  COMPREHENSIVE METABOLIC PANEL - Abnormal; Notable for the following components:      Result Value   Glucose, Bld 110 (*)    All other components within normal limits  CBC - Abnormal; Notable for the following components:   Hemoglobin 15.1 (*)    HCT 46.2 (*)    All other components within normal limits  LIPASE, BLOOD  URINALYSIS, COMPLETE (UACMP) WITH MICROSCOPIC    ____________________________________________  EKG:  ________________________________________  RADIOLOGY All imaging, including plain films, CT scans, and ultrasounds, independently reviewed by me, and interpretations confirmed via formal radiology reads.  ED MD interpretation:   CT A/P: No CT evidence of acute abnormality, mild R hydro  Official radiology report(s): CT ABDOMEN PELVIS W CONTRAST  Result Date: 09/02/2019 CLINICAL DATA:  Nausea and vomiting EXAM: CT ABDOMEN AND PELVIS WITH CONTRAST TECHNIQUE: Multidetector CT imaging of the abdomen and pelvis was performed using the standard protocol following bolus administration of intravenous contrast. CONTRAST:  172mL OMNIPAQUE IOHEXOL 300 MG/ML  SOLN COMPARISON:  CT 05/21/2019 FINDINGS: Lower chest: No acute abnormality. Hepatobiliary: No focal liver abnormality is seen. No gallstones, gallbladder wall thickening, or biliary dilatation. Pancreas: Unremarkable. No pancreatic ductal dilatation or surrounding inflammatory changes. Spleen: Normal in size without focal abnormality. Adrenals/Urinary Tract: Adrenal glands are normal. Similar mild right hydronephrosis without hydroureter. No definitive ureteral stone. The bladder is normal Stomach/Bowel: Stomach is within normal limits. Appendix not well seen but no right lower quadrant inflammatory process. No evidence of bowel wall thickening, distention, or  inflammatory changes. Vascular/Lymphatic: No significant vascular findings are present. No enlarged abdominal or pelvic lymph nodes. Reproductive: Uterus and bilateral adnexa are unremarkable. Other: Negative for free air or free fluid. Musculoskeletal: No acute or significant osseous findings. IMPRESSION: 1. No CT evidence for acute intra-abdominal or pelvic abnormality. 2. Stable mild right hydronephrosis without ureteral dilatation or evidence for a stone. Question mild UPJ configuration. Electronically Signed   By: Donavan Foil M.D.   On: 09/02/2019 21:56    ____________________________________________  PROCEDURES   Procedure(s) performed (including Critical Care):  Procedures  ____________________________________________  INITIAL IMPRESSION / MDM / [place] / ED COURSE  As part of my medical decision making, I reviewed the following data within the [place] notes reviewed and incorporated, Old chart reviewed, Notes from prior ED visits, and [place] Controlled Substance Database       *Graycie Tarricone was evaluated in Emergency Department on 09/03/2019 for the symptoms described in the history of present illness. She was evaluated in the context of the global COVID-19 pandemic, which necessitated consideration that the patient might be at risk for infection with the SARS-CoV-2 virus that causes COVID-19. Institutional protocols and algorithms that pertain to the evaluation of patients at risk for COVID-19 are in a state of rapid change based on information released by regulatory bodies including the CDC and federal and state organizations. These policies and algorithms were followed during the patient's care in the ED.  Some ED evaluations and interventions may be delayed as a result of limited staffing during the pandemic.*     Medical Decision Making: 69 year old female here with abdominal pain, nausea, vomiting, diarrhea after recent Covid  vaccination.  Patient appears well-hydrated and in no  distress on arrival here.  Suspect vaccine reaction vs viral GI illness vs food borne illness. CT scan neg for acute abnormality. CBC without leukocytosis. CMP reassuring. Lipase normal. Tele without acute changes to suggest referred cardiac sx. She feels improved with sx control here. Of note, she has some chronic hydro on her CT but does have remote h/o UTIs. She has not provided a UA here but is feeling better and would like to go home. Will have her check UA at her PCP but given her history, and reported urinary frequency (though no dysuria), with her age and lupus status, will cover empirically while we wait for UA. Return precautions given.  ____________________________________________  FINAL CLINICAL IMPRESSION(S) / ED DIAGNOSES  Final diagnoses:  Nausea vomiting and diarrhea     MEDICATIONS GIVEN DURING THIS VISIT:  Medications  sodium chloride flush (NS) 0.9 % injection 3 mL (3 mLs Intravenous Given 09/02/19 2050)  sodium chloride 0.9 % bolus 1,000 mL (1,000 mLs Intravenous New Bag/Given 09/02/19 2051)  ondansetron (ZOFRAN) injection 4 mg (4 mg Intravenous Given 09/02/19 2205)  sodium chloride 0.9 % bolus 1,000 mL (1,000 mLs Intravenous New Bag/Given 09/02/19 2200)  ketorolac (TORADOL) 30 MG/ML injection 15 mg (15 mg Intravenous Given 09/02/19 2207)  morphine 4 MG/ML injection 4 mg (4 mg Intravenous Given 09/02/19 2206)  iohexol (OMNIPAQUE) 300 MG/ML solution 100 mL (100 mLs Intravenous Contrast Given 09/02/19 2129)     ED Discharge Orders         Ordered    ondansetron (ZOFRAN ODT) 4 MG disintegrating tablet  Every 8 hours PRN     09/03/19 0002    dicyclomine (BENTYL) 10 MG capsule  3 times daily before meals & bedtime     09/03/19 0002           Note:  This document was prepared using Dragon voice recognition software and may include unintentional dictation errors.   Duffy Bruce, MD 09/03/19 Theresia Majors, MD 09/03/19 [id number]    Duffy Bruce, MD 09/03/19 [phone]

## 2019-09-03 MED ORDER — ONDANSETRON 4 MG PO TBDP: 4.0000 mg | ORAL_TABLET | Freq: Three times a day (TID) | ORAL | 0 refills | Status: DC | PRN

## 2019-09-03 MED ORDER — DICYCLOMINE HCL 10 MG PO CAPS: 10.0000 mg | ORAL_CAPSULE | Freq: Three times a day (TID) | ORAL | 0 refills | Status: DC

## 2019-09-03 MED ORDER — CEPHALEXIN 500 MG PO CAPS: 500.0000 mg | ORAL_CAPSULE | Freq: Three times a day (TID) | ORAL | 0 refills | Status: AC

## 2019-09-17 DIAGNOSIS — R42 Dizziness and giddiness: Secondary | ICD-10-CM | POA: Insufficient documentation

## 2019-09-17 DIAGNOSIS — R197 Diarrhea, unspecified: Secondary | ICD-10-CM | POA: Insufficient documentation

## 2019-10-01 DIAGNOSIS — M1712 Unilateral primary osteoarthritis, left knee: Secondary | ICD-10-CM | POA: Insufficient documentation

## 2019-11-15 ENCOUNTER — Emergency Department: Payer: Medicare Other

## 2019-11-15 ENCOUNTER — Emergency Department
Admission: EM | Admit: 2019-11-15 | Discharge: 2019-11-16 | Disposition: A | Discharge status: Home / Self Care | Payer: Medicare Other | Attending: Emergency Medicine | Admitting: Emergency Medicine

## 2019-11-15 ENCOUNTER — Other Ambulatory Visit: Payer: Self-pay

## 2019-11-15 DIAGNOSIS — Z79899 Other long term (current) drug therapy: Secondary | ICD-10-CM | POA: Diagnosis not present

## 2019-11-15 DIAGNOSIS — I10 Essential (primary) hypertension: Secondary | ICD-10-CM | POA: Diagnosis not present

## 2019-11-15 DIAGNOSIS — R1031 Right lower quadrant pain: Secondary | ICD-10-CM | POA: Insufficient documentation

## 2019-11-15 DIAGNOSIS — E039 Hypothyroidism, unspecified: Secondary | ICD-10-CM | POA: Insufficient documentation

## 2019-11-15 DIAGNOSIS — R1084 Generalized abdominal pain: Secondary | ICD-10-CM

## 2019-11-15 DIAGNOSIS — R300 Dysuria: Secondary | ICD-10-CM | POA: Diagnosis not present

## 2019-11-15 DIAGNOSIS — K219 Gastro-esophageal reflux disease without esophagitis: Secondary | ICD-10-CM | POA: Diagnosis not present

## 2019-11-15 DIAGNOSIS — J449 Chronic obstructive pulmonary disease, unspecified: Secondary | ICD-10-CM | POA: Diagnosis not present

## 2019-11-15 DIAGNOSIS — R112 Nausea with vomiting, unspecified: Secondary | ICD-10-CM | POA: Diagnosis not present

## 2019-11-15 DIAGNOSIS — R109 Unspecified abdominal pain: Secondary | ICD-10-CM | POA: Diagnosis present

## 2019-11-15 LAB — COMPREHENSIVE METABOLIC PANEL
ALT: 15 U/L (ref 0–44)
AST: 19 U/L (ref 15–41)
Albumin: 3.9 g/dL (ref 3.5–5.0)
Alkaline Phosphatase: 91 U/L (ref 38–126)
Anion gap: 9 (ref 5–15)
BUN: 16 mg/dL (ref 8–23)
CO2: 29 mmol/L (ref 22–32)
Calcium: 9.5 mg/dL (ref 8.9–10.3)
Chloride: 104 mmol/L (ref 98–111)
Creatinine, Ser: 1.01 mg/dL — ABNORMAL HIGH (ref 0.44–1.00)
GFR calc Af Amer: >60 mL/min (ref >60)
GFR calc non Af Amer: 58 mL/min — ABNORMAL LOW (ref >60)
Glucose, Bld: 96 mg/dL (ref 70–99)
Potassium: 5.7 mmol/L — ABNORMAL HIGH (ref 3.5–5.1)
Sodium: 142 mmol/L (ref 135–145)
Total Bilirubin: 0.6 mg/dL (ref 0.3–1.2)
Total Protein: 6.7 g/dL (ref 6.5–8.1)

## 2019-11-15 LAB — URINALYSIS, COMPLETE (UACMP) WITH MICROSCOPIC
Bacteria, UA: NONE SEEN
Bilirubin Urine: NEGATIVE
Glucose, UA: NEGATIVE mg/dL
Hgb urine dipstick: NEGATIVE
Ketones, ur: NEGATIVE mg/dL
Leukocytes,Ua: NEGATIVE
Nitrite: NEGATIVE
Protein, ur: NEGATIVE mg/dL
Specific Gravity, Urine: 1.014 (ref 1.005–1.030)
pH: 6 (ref 5.0–8.0)

## 2019-11-15 LAB — CBC
HCT: 46.4 % — ABNORMAL HIGH (ref 36.0–46.0)
Hemoglobin: 15.3 g/dL — ABNORMAL HIGH (ref 12.0–15.0)
MCH: 31.1 pg (ref 26.0–34.0)
MCHC: 33 g/dL (ref 30.0–36.0)
MCV: 94.3 fL (ref 80.0–100.0)
Platelets: 195 10*3/uL (ref 150–400)
RBC: 4.92 MIL/uL (ref 3.87–5.11)
RDW: 14.5 % (ref 11.5–15.5)
WBC: 7.7 10*3/uL (ref 4.0–10.5)
nRBC: 0 % (ref 0.0–0.2)

## 2019-11-15 LAB — LIPASE, BLOOD: Lipase: 22 U/L (ref 11–51)

## 2019-11-15 MED ORDER — ONDANSETRON HCL 4 MG PO TABS: 4.0000 mg | ORAL_TABLET | Freq: Every day | ORAL | 0 refills | Status: DC | PRN

## 2019-11-15 MED ORDER — MORPHINE SULFATE (PF) 4 MG/ML IV SOLN
4.0000 mg | Freq: Once | INTRAVENOUS | Status: AC
  Administered 2019-11-15: 4 mg via INTRAVENOUS
  Filled 2019-11-15: qty 1

## 2019-11-15 MED ORDER — ONDANSETRON HCL 4 MG/2ML IJ SOLN
4.0000 mg | Freq: Once | INTRAMUSCULAR | Status: AC
  Administered 2019-11-15: 4 mg via INTRAVENOUS
  Filled 2019-11-15: qty 2

## 2019-11-15 MED ORDER — IOHEXOL 9 MG/ML PO SOLN
500.0000 mL | Freq: Two times a day (BID) | ORAL | Status: DC | PRN
  Administered 2019-11-15: 500 mL via ORAL

## 2019-11-15 MED ORDER — OXYCODONE-ACETAMINOPHEN 5-325 MG PO TABS
1.0000 | ORAL_TABLET | ORAL | Status: DC | PRN
  Administered 2019-11-15: 1 via ORAL
  Filled 2019-11-15: qty 1

## 2019-11-15 MED ORDER — IOHEXOL 300 MG/ML  SOLN
100.0000 mL | Freq: Once | INTRAMUSCULAR | Status: AC | PRN
  Administered 2019-11-15: 100 mL via INTRAVENOUS

## 2019-11-15 MED ORDER — ONDANSETRON 4 MG PO TBDP
4.0000 mg | ORAL_TABLET | Freq: Once | ORAL | Status: AC | PRN
  Administered 2019-11-15: 4 mg via ORAL
  Filled 2019-11-15: qty 1

## 2019-11-15 MED ORDER — HYDROCODONE-ACETAMINOPHEN 5-325 MG PO TABS: 1.0000 | ORAL_TABLET | ORAL | 0 refills | Status: DC | PRN

## 2019-11-15 NOTE — Discharge Instructions (Signed)
Please take your pain and nausea medication as needed, as prescribed.  Please call your doctor tomorrow morning to arrange a follow-up appointment within the next 1 to 2 days for recheck/reevaluation.  Return to the emergency department for any return of/worsening abdominal pain, fever, or any other symptom personally concerning to yourself.

## 2019-11-15 NOTE — ED Provider Notes (Signed)
[facility] Emergency Department Provider Note  Time seen: 9:16 PM  I have reviewed the triage vital signs and the nursing notes.   HISTORY  Chief Complaint Back Pain and Abdominal Pain   HPI Shelly Freeman is a 69 y.o. female with a past medical history of arthritis, hypertension, lupus, presents to the emergency department for right-sided abdominal pain.  According to the patient since 10 AM this morning she has been experiencing moderate dull pain to the right side of her abdomen.  Denies any diarrhea but does state nausea vomiting.  Denies fever.   Past Medical History:  Diagnosis Date  . Arthritis   . Collagen vascular disease ([place])   . DDD (degenerative disc disease), lumbar 05/14/2016  . Depression   . Heart valve problem    per grandaughter need surgery but they told her may not survive;  [name] Hypertension   . Hypothyroidism   . Lupus ([place])   . Neuromuscular disorder ([place])   . Thyroid disease   . Vitamin D deficiency 05/14/2016    Patient Active Problem List   Diagnosis Date Noted  . Morbid obesity ([place]) 05/23/2019  . Acute lower UTI 05/23/2019  . Right rib fracture 05/23/2019  . SIRS (systemic inflammatory response syndrome) ([place]) 05/23/2019  . Bacteremia due to Gram-negative bacteria 05/23/2019  . Acute bronchiolitis 01/28/2019  . Left anterior knee pain 10/24/2016  . Right upper quadrant pain 10/24/2016  . Urination pain 06/26/2016  . DDD (degenerative disc disease), lumbar 05/14/2016  . Lipoma of skin 05/14/2016  . Vitamin D deficiency, unspecified 05/14/2016  . Major neurocognitive disorder, due to vascular disease, with behavioral disturbance, mild ([place]) 04/15/2016  . Severe recurrent major depression with psychotic features ([place]) 04/04/2016  . Lupus ([place]) 04/04/2016  . GERD (gastroesophageal reflux disease) 04/04/2016  . COPD with acute bronchitis ([place]) 04/04/2016  . Acute respiratory failure with hypoxia ([place]) 04/03/2016  .  Encephalopathy, metabolic 81/85/6314  . Hypokalemia 04/03/2016  . Pain in limb 04/03/2016  . Hypothyroidism 04/03/2016  . Essential hypertension 04/03/2016  . Intentional opiate overdose ([place]) 03/30/2016  . Age related osteoporosis 03/19/2016  . Chronic midline low back pain without sciatica 03/19/2016  . Encounter for long-term (current) use of high-risk medication 03/19/2016  . Primary osteoarthritis of both knees 03/19/2016    Past Surgical History:  Procedure Laterality Date  . APPENDECTOMY    . BREAST BIOPSY Left    neg  . BREAST LUMPECTOMY Left   . CESAREAN SECTION    . COLONOSCOPY WITH PROPOFOL N/A 07/01/2017   Procedure: COLONOSCOPY WITH PROPOFOL;  Surgeon: Manya Silvas, MD;  Location[facility] ENDOSCOPY;  Service: Endoscopy;  Laterality: N/A;  . ESOPHAGOGASTRODUODENOSCOPY (EGD) WITH PROPOFOL N/A 07/01/2017   Procedure: ESOPHAGOGASTRODUODENOSCOPY (EGD) WITH PROPOFOL;  Surgeon: Manya Silvas, MD;  Location: [facility] ENDOSCOPY;  Service: Endoscopy;  Laterality: N/A;  . GASTRIC BYPASS    . TUBAL LIGATION      Prior to Admission medications   Medication Sig Start Date End Date Taking? Authorizing Provider  acetaminophen (TYLENOL) 325 MG tablet Take 650 mg by mouth every 6 (six) hours as needed.    [provider]  albuterol (VENTOLIN HFA) 108 (90 Base) MCG/ACT inhaler Inhale 2 puffs into the lungs every 6 (six) hours as needed for wheezing or shortness of breath. 06/09/19   Arta Silence, MD  alendronate (FOSAMAX) 70 MG tablet Take 70 mg by mouth once a week. Take with a full glass of water on an empty  stomach.    [provider]  B Complex Vitamins (B COMPLEX PO) Take 1 tablet by mouth daily.     [provider]  busPIRone (BUSPAR) 10 MG tablet Take 10 mg by mouth daily.    [provider]  cefpodoxime (VANTIN) 200 MG tablet Take 1 tablet (200 mg total) by mouth 2 (two) times daily. 05/25/19   Annita Brod, MD  celecoxib (CELEBREX)  200 MG capsule Take 200 mg by mouth daily.     [provider]  chlorpheniramine-HYDROcodone (TUSSIONEX PENNKINETIC ER) 10-8 MG/5ML SUER Take 5 mLs by mouth 2 (two) times daily. 05/27/19   Earleen Newport, MD  cyclobenzaprine (FLEXERIL) 5 MG tablet Take 5 mg by mouth at bedtime as needed. 05/19/19   [provider]  diclofenac Sodium (VOLTAREN) 1 % GEL Apply 2 g topically 4 (four) times daily.  05/19/19   [provider]  dicyclomine (BENTYL) 10 MG capsule Take 1 capsule (10 mg total) by mouth 4 (four) times daily -  before meals and at bedtime for 5 days. 09/03/19 09/08/19  Duffy Bruce, MD  docusate sodium (COLACE) 100 MG capsule Take 1 capsule (100 mg total) by mouth 2 (two) times daily. 01/30/19   Henreitta Leber, MD  DULoxetine (CYMBALTA) 60 MG capsule Take 1 capsule (60 mg total) by mouth daily. Patient taking differently: Take 30 mg by mouth daily.  04/17/16   Pucilowska, Herma Ard B, MD  famotidine (PEPCID) 20 MG tablet Take 1 tablet (20 mg total) by mouth 2 (two) times daily. Patient taking differently: Take 20 mg by mouth daily.  04/16/16   Pucilowska, Herma Ard B, MD  ferrous sulfate 325 (65 FE) MG tablet Take 325 mg by mouth daily.    [provider]  folic acid (FOLVITE) 1 MG tablet Take 1 mg by mouth daily.    [provider]  gabapentin (NEURONTIN) 800 MG tablet Take 800 mg by mouth 2 (two) times daily.    [provider]  HYDROcodone-acetaminophen (NORCO/VICODIN) 5-325 MG tablet Take 1 tablet by mouth 2 (two) times daily as needed. 05/20/19   [provider]  hydroxychloroquine (PLAQUENIL) 200 MG tablet Take 2 tablets (400 mg total) by mouth daily. Patient taking differently: Take 200 mg by mouth 2 (two) times daily.  04/04/16   Theodoro Grist, MD  ibuprofen (ADVIL) 200 MG tablet Take 400-800 mg by mouth every 6 (six) hours as needed for fever, mild pain or moderate pain.    [provider]  ipratropium-albuterol  (DUONEB) 0.5-2.5 (3) MG/3ML SOLN Take 3 mLs by nebulization every 6 (six) hours as needed (Wheezing). 01/30/19   Henreitta Leber, MD  isosorbide mononitrate (IMDUR) 30 MG 24 hr tablet Take 30 mg by mouth daily.    [provider]  levothyroxine (SYNTHROID, LEVOTHROID) 75 MCG tablet Take 75 mcg by mouth daily before breakfast.    [provider]  LORazepam (ATIVAN) 0.5 MG tablet Take 0.5 mg by mouth at bedtime.    [provider]  metoprolol succinate (TOPROL-XL) 25 MG 24 hr tablet Take 25 mg by mouth daily.     [provider]  OLANZapine (ZYPREXA) 15 MG tablet Take 1 tablet (15 mg total) by mouth at bedtime. 04/16/16   Pucilowska, Herma Ard B, MD  ondansetron (ZOFRAN ODT) 4 MG disintegrating tablet Take 1 tablet (4 mg total) by mouth every 8 (eight) hours as needed for nausea or vomiting. 09/03/19   Duffy Bruce, MD  pantoprazole (PROTONIX) 40  MG tablet Take 40 mg by mouth 2 (two) times daily.     [provider]  predniSONE (STERAPRED UNI-PAK 21 TAB) 10 MG (21) TBPK tablet Dispense steroid taper pack as directed 05/27/19   Earleen Newport, MD  traZODone (DESYREL) 100 MG tablet Take 100 mg by mouth at bedtime.    [provider]    No Known Allergies  Family History  Problem Relation Age of Onset  . Breast cancer Maternal Aunt   . Breast cancer Cousin     Social History Social History   Tobacco Use  . Smoking status: Never Smoker  . Smokeless tobacco: Never Used  Vaping Use  . Vaping Use: Never used  Substance Use Topics  . Alcohol use: No  . Drug use: No    Review of Systems Constitutional: Negative for fever Cardiovascular: Negative for chest pain. Respiratory: Negative for shortness of breath. Gastrointestinal: Right-sided abdominal pain.  Positive for nausea vomiting.  Negative or diarrhea. Genitourinary: Positive for dysuria. Musculoskeletal: Negative for musculoskeletal complaints Neurological: Negative for  headache All other ROS negative  ____________________________________________   PHYSICAL EXAM:  VITAL SIGNS: ED Triage Vitals  Enc Vitals Group     BP 11/15/19 1708 110/77     Pulse Rate 11/15/19 1708 71     Resp 11/15/19 1708 (!) 24     Temp 11/15/19 1708 98.2 F (36.8 C)     Temp Source 11/15/19 1708 Oral     SpO2 11/15/19 1708 95 %     Weight 11/15/19 1707 214 lb (97.1 kg)     Height 11/15/19 1707 5\' 1"  (1.549 m)     Head Circumference --      Peak Flow --      Pain Score 11/15/19 1706 10     Pain Loc --      Pain Edu? --      Excl. in [place]? --    Constitutional: Alert and oriented.  Patient tearful at times saying pain in her right lower quadrant. Eyes: Normal exam ENT      Head: Normocephalic and atraumatic.      Mouth/Throat: Mucous membranes are moist. Cardiovascular: Normal rate, regular rhythm.  Respiratory: Normal respiratory effort without tachypnea nor retractions. Breath sounds are clear  Gastrointestinal: Soft, mild right lower quadrant tenderness palpation otherwise benign abdomen.  Obese. Musculoskeletal: Nontender with normal range of motion in all extremities.  Neurologic:  Normal speech and language. No gross focal neurologic deficits  Skin:  Skin is warm, dry and intact.  Psychiatric: Mood and affect are normal  ____________________________________________     RADIOLOGY  CT is negative for acute abnormality.  ____________________________________________   INITIAL IMPRESSION / ASSESSMENT AND PLAN / ED COURSE  Pertinent labs & imaging results that were available during my care of the patient were reviewed by me and considered in my medical decision making (see chart for details).   Patient presents emergency department for right-sided abdominal pain since 10 AM.  Mild right lower quadrant tenderness.  Patient's labs are largely nonrevealing.  Urinalysis appears normal.  Normal white blood cell count.  Normal LFTs and lipase.  We will proceed with  CT imaging to further evaluate.  Differential would include ureterolithiasis, appendicitis colitis or diverticulitis.  CT scans negative for acute abnormality.  Labs are largely within normal limits, besides slight hyperkalemia, patient has received fluids we will repeat a potassium.  If improving anticipate discharge home.  Patient care signed out to oncoming physician.  Darria  Swiech was evaluated in Emergency Department on 11/15/2019 for the symptoms described in the history of present illness. She was evaluated in the context of the global COVID-19 pandemic, which necessitated consideration that the patient might be at risk for infection with the SARS-CoV-2 virus that causes COVID-19. Institutional protocols and algorithms that pertain to the evaluation of patients at risk for COVID-19 are in a state of rapid change based on information released by regulatory bodies including the CDC and federal and state organizations. These policies and algorithms were followed during the patient's care in the ED.  ____________________________________________   FINAL CLINICAL IMPRESSION(S) / ED DIAGNOSES  Right lower quadrant abdominal pain   Harvest Dark, MD 11/15/19 2327

## 2019-11-15 NOTE — ED Notes (Signed)
Pt to cT

## 2019-11-15 NOTE — ED Notes (Signed)
VS obtained by this RN. Pt intermittently moaning out at this time. Pt c/o continued pain to R mid back at this time. VSS.

## 2019-11-15 NOTE — ED Triage Notes (Signed)
Interpreter used for triage. C/o of back and abd pain today. States painful urination. Pt crying in triage. No appendix, still has gallbladder. Also c/o vomiting and dizziness. Hx kidney stones.

## 2019-11-15 NOTE — ED Notes (Signed)
First RN Note: Pt presents to ED via POV with c/o back pain, dizziness, and vomiting. Pt placed in wheelchair upon arrival to ED. Pt visualized upon arrival to ED looking at staff and repeatedly yelling out, pt increasingly louder from time of arrival.   Interpreter requested by this RN for triage.

## 2019-11-15 NOTE — ED Notes (Signed)
See triage note, pt reports lower abdominal pain that started at 10 this AM with N/V. Denies fever, diarrhea. Reports "little" pain with urination.  Abdomen tender upon palpitation. Alert and oriented.

## 2019-11-15 NOTE — ED Notes (Signed)
Pt offered nausea medicine, denied.

## 2019-11-16 DIAGNOSIS — R1031 Right lower quadrant pain: Secondary | ICD-10-CM | POA: Diagnosis not present

## 2019-11-16 LAB — POTASSIUM: Potassium: 4.5 mmol/L (ref 3.5–5.1)

## 2019-11-16 NOTE — ED Provider Notes (Signed)
I was asked by Dr. Kerman Passey to follow up the repeat K and if normal, patient to be discharged per plan he left for her. K is 4.5. Patient is comfortable with plan from Dr. Kerman Passey. Stable for dc with f/u with PCP.   Alfred Levins, [place], MD 11/16/19 [phone]

## 2019-12-11 ENCOUNTER — Other Ambulatory Visit: Payer: Self-pay | Admitting: Family Medicine

## 2019-12-11 DIAGNOSIS — Z1231 Encounter for screening mammogram for malignant neoplasm of breast: Secondary | ICD-10-CM

## 2020-03-04 ENCOUNTER — Other Ambulatory Visit
Admission: RE | Admit: 2020-03-04 | Discharge: 2020-03-04 | Disposition: A | Discharge status: Home / Self Care | Payer: Medicare Other | Source: Ambulatory Visit | Attending: Student | Admitting: Student

## 2020-03-04 DIAGNOSIS — R079 Chest pain, unspecified: Secondary | ICD-10-CM | POA: Insufficient documentation

## 2020-03-04 LAB — TROPONIN I (HIGH SENSITIVITY): Troponin I (High Sensitivity): 4 ng/L (ref <18)

## 2020-03-14 ENCOUNTER — Emergency Department
Admission: EM | Admit: 2020-03-14 | Discharge: 2020-03-14 | Disposition: A | Discharge status: Home / Self Care | Payer: Medicare Other | Attending: Emergency Medicine | Admitting: Emergency Medicine

## 2020-03-14 ENCOUNTER — Other Ambulatory Visit: Payer: Self-pay

## 2020-03-14 ENCOUNTER — Encounter: Payer: Self-pay | Admitting: Medical Oncology

## 2020-03-14 ENCOUNTER — Emergency Department: Payer: Medicare Other

## 2020-03-14 DIAGNOSIS — J449 Chronic obstructive pulmonary disease, unspecified: Secondary | ICD-10-CM | POA: Diagnosis not present

## 2020-03-14 DIAGNOSIS — E039 Hypothyroidism, unspecified: Secondary | ICD-10-CM | POA: Diagnosis not present

## 2020-03-14 DIAGNOSIS — N12 Tubulo-interstitial nephritis, not specified as acute or chronic: Secondary | ICD-10-CM | POA: Insufficient documentation

## 2020-03-14 DIAGNOSIS — R109 Unspecified abdominal pain: Secondary | ICD-10-CM

## 2020-03-14 DIAGNOSIS — Z79899 Other long term (current) drug therapy: Secondary | ICD-10-CM | POA: Insufficient documentation

## 2020-03-14 DIAGNOSIS — I10 Essential (primary) hypertension: Secondary | ICD-10-CM | POA: Diagnosis not present

## 2020-03-14 LAB — BASIC METABOLIC PANEL
Anion gap: 9 (ref 5–15)
BUN: 16 mg/dL (ref 8–23)
CO2: 24 mmol/L (ref 22–32)
Calcium: 9.2 mg/dL (ref 8.9–10.3)
Chloride: 106 mmol/L (ref 98–111)
Creatinine, Ser: 0.68 mg/dL (ref 0.44–1.00)
GFR, Estimated: >60 mL/min (ref >60)
Glucose, Bld: 88 mg/dL (ref 70–99)
Potassium: 4.9 mmol/L (ref 3.5–5.1)
Sodium: 139 mmol/L (ref 135–145)

## 2020-03-14 LAB — CBC
HCT: 46 % (ref 36.0–46.0)
Hemoglobin: 14.7 g/dL (ref 12.0–15.0)
MCH: 31.7 pg (ref 26.0–34.0)
MCHC: 32 g/dL (ref 30.0–36.0)
MCV: 99.1 fL (ref 80.0–100.0)
Platelets: 182 10*3/uL (ref 150–400)
RBC: 4.64 MIL/uL (ref 3.87–5.11)
RDW: 14.3 % (ref 11.5–15.5)
WBC: 9.1 10*3/uL (ref 4.0–10.5)
nRBC: 0 % (ref 0.0–0.2)

## 2020-03-14 LAB — URINALYSIS, COMPLETE (UACMP) WITH MICROSCOPIC
Bilirubin Urine: NEGATIVE
Glucose, UA: NEGATIVE mg/dL
Hgb urine dipstick: NEGATIVE
Ketones, ur: NEGATIVE mg/dL
Nitrite: POSITIVE — AB
Protein, ur: NEGATIVE mg/dL
Specific Gravity, Urine: 1.011 (ref 1.005–1.030)
WBC, UA: >50 WBC/hpf — ABNORMAL HIGH (ref 0–5)
pH: 6 (ref 5.0–8.0)

## 2020-03-14 MED ORDER — HYDROCODONE-ACETAMINOPHEN 5-325 MG PO TABS: 2.0000 | ORAL_TABLET | Freq: Four times a day (QID) | ORAL | 0 refills | Status: AC | PRN

## 2020-03-14 MED ORDER — ONDANSETRON 4 MG PO TBDP: 4.0000 mg | ORAL_TABLET | Freq: Three times a day (TID) | ORAL | 0 refills | Status: AC | PRN

## 2020-03-14 MED ORDER — CEPHALEXIN 500 MG PO CAPS: 500.0000 mg | ORAL_CAPSULE | Freq: Four times a day (QID) | ORAL | 0 refills | Status: AC

## 2020-03-14 MED ORDER — SODIUM CHLORIDE 0.9 % IV SOLN
1.0000 g | Freq: Once | INTRAVENOUS | Status: AC
  Administered 2020-03-14: 1 g via INTRAVENOUS
  Filled 2020-03-14: qty 10

## 2020-03-14 NOTE — Discharge Instructions (Signed)
Take Keflex 4 times daily for the next 7 days. You have been prescribed a 2-day supply of Norco for pain.  Please take Zofran with Norco to prevent nausea. If symptoms do not improve, please return to the emergency department for reevaluation.

## 2020-03-14 NOTE — ED Provider Notes (Signed)
Emergency Department Provider Note  ____________________________________________  Time seen: Approximately 6:17 PM  I have reviewed the triage vital signs and the nursing notes.   HISTORY  Chief Complaint Flank Pain   Historian Patient    HPI Shelly Freeman is a 69 y.o. female presents to the emergency department with right-sided flank pain that radiates to her right groin for the past 3 days.  Patient is also had dysuria, hematuria and increased urinary frequency.  She denies fever and chills at home.  No nausea or vomiting.  Patient states that she has had nephrolithiasis in the past and her current symptoms feel like a kidney stone.  She denies chest pain or chest tightness.  No other alleviating measures have been attempted.   Past Medical History:  Diagnosis Date  . Arthritis   . Collagen vascular disease ([place])   . DDD (degenerative disc disease), lumbar 05/14/2016  . Depression   . Heart valve problem    per grandaughter need surgery but they told her may not survive;  [name] Hypertension   . Hypothyroidism   . Lupus ([place])   . Neuromuscular disorder ([place])   . Thyroid disease   . Vitamin D deficiency 05/14/2016     Immunizations up to date:  Yes.     Past Medical History:  Diagnosis Date  . Arthritis   . Collagen vascular disease ([place])   . DDD (degenerative disc disease), lumbar 05/14/2016  . Depression   . Heart valve problem    per grandaughter need surgery but they told her may not survive;  [name] Hypertension   . Hypothyroidism   . Lupus ([place])   . Neuromuscular disorder ([place])   . Thyroid disease   . Vitamin D deficiency 05/14/2016    Patient Active Problem List   Diagnosis Date Noted  . Morbid obesity ([place]) 05/23/2019  . Acute lower UTI 05/23/2019  . Right rib fracture 05/23/2019  . SIRS (systemic inflammatory response syndrome) ([place]) 05/23/2019  . Bacteremia due to Gram-negative bacteria 05/23/2019  . Acute bronchiolitis 01/28/2019  . Left  anterior knee pain 10/24/2016  . Right upper quadrant pain 10/24/2016  . Urination pain 06/26/2016  . DDD (degenerative disc disease), lumbar 05/14/2016  . Lipoma of skin 05/14/2016  . Vitamin D deficiency, unspecified 05/14/2016  . Major neurocognitive disorder, due to vascular disease, with behavioral disturbance, mild ([place]) 04/15/2016  . Severe recurrent major depression with psychotic features ([place]) 04/04/2016  . Lupus ([place]) 04/04/2016  . GERD (gastroesophageal reflux disease) 04/04/2016  . COPD with acute bronchitis ([place]) 04/04/2016  . Acute respiratory failure with hypoxia ([place]) 04/03/2016  . Encephalopathy, metabolic 18/29/9371  . Hypokalemia 04/03/2016  . Pain in limb 04/03/2016  . Hypothyroidism 04/03/2016  . Essential hypertension 04/03/2016  . Intentional opiate overdose ([place]) 03/30/2016  . Age related osteoporosis 03/19/2016  . Chronic midline low back pain without sciatica 03/19/2016  . Encounter for long-term (current) use of high-risk medication 03/19/2016  . Primary osteoarthritis of both knees 03/19/2016    Past Surgical History:  Procedure Laterality Date  . APPENDECTOMY    . BREAST BIOPSY Left    neg  . BREAST LUMPECTOMY Left   . CESAREAN SECTION    . COLONOSCOPY WITH PROPOFOL N/A 07/01/2017   Procedure: COLONOSCOPY WITH PROPOFOL;  Surgeon: Manya Silvas, MD;  Location[facility] ENDOSCOPY;  Service: Endoscopy;  Laterality: N/A;  . ESOPHAGOGASTRODUODENOSCOPY (EGD) WITH PROPOFOL N/A 07/01/2017   Procedure: ESOPHAGOGASTRODUODENOSCOPY (EGD) WITH PROPOFOL;  Surgeon: Manya Silvas, MD;  Location: [facility] ENDOSCOPY;  Service: Endoscopy;  Laterality: N/A;  . GASTRIC BYPASS    . TUBAL LIGATION      Prior to Admission medications   Medication Sig Start Date End Date Taking? Authorizing Provider  acetaminophen (TYLENOL) 325 MG tablet Take 650 mg by mouth every 6 (six) hours as needed.    [provider]  albuterol (VENTOLIN HFA) 108 (90 Base) MCG/ACT inhaler  Inhale 2 puffs into the lungs every 6 (six) hours as needed for wheezing or shortness of breath. 06/09/19   Arta Silence, MD  alendronate (FOSAMAX) 70 MG tablet Take 70 mg by mouth once a week. Take with a full glass of water on an empty stomach.    [provider]  B Complex Vitamins (B COMPLEX PO) Take 1 tablet by mouth daily.     [provider]  busPIRone (BUSPAR) 10 MG tablet Take 10 mg by mouth daily.    [provider]  celecoxib (CELEBREX) 200 MG capsule Take 200 mg by mouth daily.     [provider]  cephALEXin (KEFLEX) 500 MG capsule Take 1 capsule (500 mg total) by mouth 4 (four) times daily for 7 days. 03/14/20 03/21/20  Lannie Fields, PA-C  chlorpheniramine-HYDROcodone (TUSSIONEX PENNKINETIC ER) 10-8 MG/5ML SUER Take 5 mLs by mouth 2 (two) times daily. 05/27/19   Earleen Newport, MD  cyclobenzaprine (FLEXERIL) 5 MG tablet Take 5 mg by mouth at bedtime as needed. 05/19/19   [provider]  diclofenac Sodium (VOLTAREN) 1 % GEL Apply 2 g topically 4 (four) times daily.  05/19/19   [provider]  dicyclomine (BENTYL) 10 MG capsule Take 1 capsule (10 mg total) by mouth 4 (four) times daily -  before meals and at bedtime for 5 days. 09/03/19 09/08/19  Duffy Bruce, MD  docusate sodium (COLACE) 100 MG capsule Take 1 capsule (100 mg total) by mouth 2 (two) times daily. 01/30/19   Henreitta Leber, MD  DULoxetine (CYMBALTA) 60 MG capsule Take 1 capsule (60 mg total) by mouth daily. Patient taking differently: Take 30 mg by mouth daily.  04/17/16   Pucilowska, Herma Ard B, MD  famotidine (PEPCID) 20 MG tablet Take 1 tablet (20 mg total) by mouth 2 (two) times daily. Patient taking differently: Take 20 mg by mouth daily.  04/16/16   Pucilowska, Herma Ard B, MD  ferrous sulfate 325 (65 FE) MG tablet Take 325 mg by mouth daily.    [provider]  folic acid (FOLVITE) 1 MG tablet Take 1 mg by mouth daily.    [provider]  gabapentin (NEURONTIN) 800 MG tablet Take 800 mg by mouth 2 (two) times daily.    [provider]  HYDROcodone-acetaminophen (NORCO/VICODIN) 5-325 MG tablet Take 2 tablets by mouth every 6 (six) hours as needed for up to 2 days for moderate pain. 03/14/20 03/16/20  Lannie Fields, PA-C  hydroxychloroquine (PLAQUENIL) 200 MG tablet Take 2 tablets (400 mg total) by mouth daily. Patient taking differently: Take 200 mg by mouth 2 (two) times daily.  04/04/16   Theodoro Grist, MD  ibuprofen (ADVIL) 200 MG tablet Take 400-800 mg by mouth every 6 (six) hours as needed for fever, mild pain or moderate pain.    [provider]  ipratropium-albuterol (DUONEB) 0.5-2.5 (3) MG/3ML SOLN Take 3 mLs by nebulization every 6 (six) hours as needed (Wheezing). 01/30/19   Henreitta Leber, MD  isosorbide mononitrate (IMDUR) 30 MG 24 hr tablet Take 30  mg by mouth daily.    [provider]  levothyroxine (SYNTHROID, LEVOTHROID) 75 MCG tablet Take 75 mcg by mouth daily before breakfast.    [provider]  LORazepam (ATIVAN) 0.5 MG tablet Take 0.5 mg by mouth at bedtime.    [provider]  metoprolol succinate (TOPROL-XL) 25 MG 24 hr tablet Take 25 mg by mouth daily.     [provider]  OLANZapine (ZYPREXA) 15 MG tablet Take 1 tablet (15 mg total) by mouth at bedtime. 04/16/16   Pucilowska, Jolanta B, MD  ondansetron (ZOFRAN ODT) 4 MG disintegrating tablet Take 1 tablet (4 mg total) by mouth every 8 (eight) hours as needed for up to 5 days. 03/14/20 03/19/20  Lannie Fields, PA-C  ondansetron (ZOFRAN) 4 MG tablet Take 1 tablet (4 mg total) by mouth daily as needed for nausea or vomiting. 11/15/19   Harvest Dark, MD  pantoprazole (PROTONIX) 40 MG tablet Take 40 mg by mouth 2 (two) times daily.     [provider]  predniSONE (STERAPRED UNI-PAK 21 TAB) 10 MG (21) TBPK tablet Dispense steroid taper pack as directed 05/27/19   Earleen Newport, MD   traZODone (DESYREL) 100 MG tablet Take 100 mg by mouth at bedtime.    [provider]    Allergies Patient has no known allergies.  Family History  Problem Relation Age of Onset  . Breast cancer Maternal Aunt   . Breast cancer Cousin     Social History Social History   Tobacco Use  . Smoking status: Never Smoker  . Smokeless tobacco: Never Used  Vaping Use  . Vaping Use: Never used  Substance Use Topics  . Alcohol use: No  . Drug use: No     Review of Systems  Constitutional: No fever/chills Eyes:  No discharge ENT: No upper respiratory complaints. Respiratory: no cough. No SOB/ use of accessory muscles to breath Gastrointestinal:   No nausea, no vomiting.  No diarrhea.  No constipation. Genitourinary: Patient has flank pain.  Musculoskeletal: Negative for musculoskeletal pain. Skin: Negative for rash, abrasions, lacerations, ecchymosis.    ____________________________________________   PHYSICAL EXAM:  VITAL SIGNS: ED Triage Vitals  Enc Vitals Group     BP 03/14/20 1320 (!) 141/67     Pulse Rate 03/14/20 1320 66     Resp 03/14/20 1320 17     Temp 03/14/20 1320 98.1 F (36.7 C)     Temp Source 03/14/20 1320 Oral     SpO2 03/14/20 1320 99 %     Weight 03/14/20 1319 213 lb 13.5 oz (97 kg)     Height 03/14/20 1319 5\' 1"  (1.549 m)     Head Circumference --      Peak Flow --      Pain Score 03/14/20 1319 8     Pain Loc --      Pain Edu? --      Excl. in [place]? --      Constitutional: Alert and oriented. Well appearing and in no acute distress. Eyes: Conjunctivae are normal. PERRL. EOMI. Head: Atraumatic.  Cardiovascular: Normal rate, regular rhythm. Normal S1 and S2.  Good peripheral circulation. Respiratory: Normal respiratory effort without tachypnea or retractions. Lungs CTAB. Good air entry to the bases with no decreased or absent breath sounds Gastrointestinal: Bowel sounds x 4 quadrants. Soft and nontender to palpation. No guarding or  rigidity. No distention.  Patient has CVA tenderness on the right. Musculoskeletal: Full range of motion to  all extremities. No obvious deformities noted Neurologic:  Normal for age. No gross focal neurologic deficits are appreciated.  Skin:  Skin is warm, dry and intact. No rash noted. Psychiatric: Mood and affect are normal for age. Speech and behavior are normal.   ____________________________________________   LABS (all labs ordered are listed, but only abnormal results are displayed)  Labs Reviewed  URINALYSIS, COMPLETE (UACMP) WITH MICROSCOPIC - Abnormal; Notable for the following components:      Result Value   Color, Urine YELLOW (*)    APPearance CLOUDY (*)    Nitrite POSITIVE (*)    Leukocytes,Ua LARGE (*)    WBC, UA >50 (*)    Bacteria, UA FEW (*)    All other components within normal limits  URINE CULTURE  BASIC METABOLIC PANEL  CBC   ____________________________________________  EKG   ____________________________________________  RADIOLOGY Unk Pinto, personally viewed and evaluated these images (plain radiographs) as part of my medical decision making, as well as reviewing the written report by the radiologist.    CT Renal Stone Study  Result Date: 03/14/2020 CLINICAL DATA:  Right-sided flank pain EXAM: CT ABDOMEN AND PELVIS WITHOUT CONTRAST TECHNIQUE: Multidetector CT imaging of the abdomen and pelvis was performed following the standard protocol without IV contrast. COMPARISON:  November 15, 2019 FINDINGS: Lower chest: The visualized heart size within normal limits. No pericardial fluid/thickening. No hiatal hernia. The visualized portions of the lungs are clear. Hepatobiliary: Although limited due to the lack of intravenous contrast, normal in appearance without gross focal abnormality. No evidence of calcified gallstones or biliary ductal dilatation. Pancreas:  Unremarkable.  No surrounding inflammatory changes. Spleen: Normal in size. Although limited due  to the lack of intravenous contrast, normal in appearance. Adrenals/Urinary Tract: Both adrenal glands appear normal. The kidneys and collecting system appear normal without evidence of urinary tract calculus or hydronephrosis. There is question of mild diffuse bladder wall thickening present. Stomach/Bowel: The patient is status post Roux-en-Y gastric bypass. The small is unremarkable. There is a moderate amount of colonic stool present throughout. No definite evidence of wall thickening or bowel obstruction. Vascular/Lymphatic: There are no enlarged abdominal or pelvic lymph nodes. No significant gross vascular findings are present given the lack of intravenous contrast. Reproductive: The uterus and adnexa are unremarkable. Other: No evidence of abdominal wall mass or hernia. Musculoskeletal: No acute or significant osseous findings. IMPRESSION: No renal or collecting system calculi. Mild bladder wall thickening which could be due to mild cystitis. Moderate amount of colonic stool. Electronically Signed   By: Prudencio Pair M.D.   On: 03/14/2020 18:27    ____________________________________________    PROCEDURES  Procedure(s) performed:     Procedures     Medications  cefTRIAXone (ROCEPHIN) 1 g in sodium chloride 0.9 % 100 mL IVPB (1 g Intravenous New Bag/Given 03/14/20 1812)     ____________________________________________   INITIAL IMPRESSION / ASSESSMENT AND PLAN / ED COURSE  Pertinent labs & imaging results that were available during my care of the patient were reviewed by me and considered in my medical decision making (see chart for details).  Clinical Course as of Mar 14 1858  Mon Mar 14, 2020  1731 Glucose: 88 [HS]    Clinical Course User Index [HS] Sherrill Raring, [place]     Assessment and plan Pyelonephritis 69 year old female presents to the emergency department with dysuria, hematuria, increased urinary frequency and right-sided flank pain for the past 3  days.  Patient was hypertensive at triage  but vital signs were otherwise reassuring.  On physical exam, patient did have some suprapubic tenderness to palpation right-sided CVA tenderness.  Differential diagnosis included pyelonephritis, nephrolithiasis, UTI...  Urinalysis was concerning for urinary tract infection with nitrates, large amount of leuks and few bacteria.  Urine culture is in process.  No leukocytosis on CBC.  We will obtain CT renal stone study and will reassess. There was no evidence of nephrolithiasis on CT.  CT findings did suggest cystitis.  Patient was given IV Rocephin in the emergency department.  She was discharged with Keflex 4 times daily for the next 7 days.  Also prescribed patient a short course of Norco for pain.  Return precautions were given to return with new or worsening symptoms.   ____________________________________________  FINAL CLINICAL IMPRESSION(S) / ED DIAGNOSES  Final diagnoses:  Flank pain  Pyelonephritis      NEW MEDICATIONS STARTED DURING THIS VISIT:  ED Discharge Orders         Ordered    cephALEXin (KEFLEX) 500 MG capsule  4 times daily        03/14/20 1852    HYDROcodone-acetaminophen (NORCO/VICODIN) 5-325 MG tablet  Every 6 hours PRN        03/14/20 1854    ondansetron (ZOFRAN ODT) 4 MG disintegrating tablet  Every 8 hours PRN        03/14/20 1854              This chart was dictated using voice recognition software/Dragon. Despite best efforts to proofread, errors can occur which can change the meaning. Any change was purely unintentional.     Lannie Fields, PA-C 03/14/20 1900    Blake Divine, MD 03/14/20 2340

## 2020-03-14 NOTE — ED Triage Notes (Signed)
Pt reports right flank pain that radiates around to her right groin for the past 3 days. Pt states that she has a hx fo stones.

## 2020-03-17 LAB — URINE CULTURE: Culture: >=100000 — AB

## 2020-03-23 ENCOUNTER — Other Ambulatory Visit: Payer: Self-pay

## 2020-03-23 ENCOUNTER — Encounter: Payer: Self-pay | Admitting: Anesthesiology

## 2020-03-23 ENCOUNTER — Ambulatory Visit
Admission: RE | Admit: 2020-03-23 | Discharge: 2020-03-23 | Disposition: A | Discharge status: Home / Self Care | Payer: Medicare Other | Source: Ambulatory Visit | Attending: Family Medicine | Admitting: Family Medicine

## 2020-03-23 ENCOUNTER — Encounter: Payer: Self-pay | Admitting: Ophthalmology

## 2020-03-23 DIAGNOSIS — Z1231 Encounter for screening mammogram for malignant neoplasm of breast: Secondary | ICD-10-CM

## 2020-03-24 ENCOUNTER — Other Ambulatory Visit
Admission: RE | Admit: 2020-03-24 | Discharge: 2020-03-24 | Disposition: A | Discharge status: Home / Self Care | Payer: Medicare Other | Source: Ambulatory Visit | Attending: Ophthalmology | Admitting: Ophthalmology

## 2020-03-24 DIAGNOSIS — Z20822 Contact with and (suspected) exposure to covid-19: Secondary | ICD-10-CM | POA: Diagnosis not present

## 2020-03-24 DIAGNOSIS — Z01812 Encounter for preprocedural laboratory examination: Secondary | ICD-10-CM | POA: Insufficient documentation

## 2020-03-25 LAB — SARS CORONAVIRUS 2 (TAT 6-24 HRS): SARS Coronavirus 2: NEGATIVE

## 2020-03-28 ENCOUNTER — Ambulatory Visit: Admission: RE | Admit: 2020-03-28 | Payer: Medicare Other | Source: Home / Self Care | Admitting: Ophthalmology

## 2020-03-28 HISTORY — DX: Gastro-esophageal reflux disease without esophagitis: K21.9

## 2020-03-28 HISTORY — DX: Dyspnea, unspecified: R06.00

## 2020-03-28 HISTORY — DX: Dizziness and giddiness: R42

## 2020-03-28 HISTORY — DX: Sleep apnea, unspecified: G47.30

## 2020-03-28 HISTORY — DX: Personal history of urinary calculi: Z87.442

## 2020-03-28 HISTORY — DX: Chronic obstructive pulmonary disease, unspecified: J44.9

## 2020-03-28 SURGERY — PHACOEMULSIFICATION, CATARACT, WITH IOL INSERTION
Anesthesia: Topical | Laterality: Left

## 2020-04-06 ENCOUNTER — Other Ambulatory Visit: Payer: Self-pay

## 2020-04-06 ENCOUNTER — Emergency Department: Payer: Medicare Other

## 2020-04-06 ENCOUNTER — Emergency Department
Admission: EM | Admit: 2020-04-06 | Discharge: 2020-04-06 | Disposition: A | Discharge status: Home / Self Care | Payer: Medicare Other | Attending: Emergency Medicine | Admitting: Emergency Medicine

## 2020-04-06 DIAGNOSIS — I10 Essential (primary) hypertension: Secondary | ICD-10-CM | POA: Insufficient documentation

## 2020-04-06 DIAGNOSIS — Y9301 Activity, walking, marching and hiking: Secondary | ICD-10-CM | POA: Diagnosis not present

## 2020-04-06 DIAGNOSIS — M1711 Unilateral primary osteoarthritis, right knee: Secondary | ICD-10-CM | POA: Diagnosis not present

## 2020-04-06 DIAGNOSIS — J449 Chronic obstructive pulmonary disease, unspecified: Secondary | ICD-10-CM | POA: Insufficient documentation

## 2020-04-06 DIAGNOSIS — M25561 Pain in right knee: Secondary | ICD-10-CM

## 2020-04-06 DIAGNOSIS — Z79899 Other long term (current) drug therapy: Secondary | ICD-10-CM | POA: Diagnosis not present

## 2020-04-06 DIAGNOSIS — E039 Hypothyroidism, unspecified: Secondary | ICD-10-CM | POA: Diagnosis not present

## 2020-04-06 DIAGNOSIS — W01198A Fall on same level from slipping, tripping and stumbling with subsequent striking against other object, initial encounter: Secondary | ICD-10-CM | POA: Diagnosis not present

## 2020-04-06 DIAGNOSIS — W19XXXA Unspecified fall, initial encounter: Secondary | ICD-10-CM

## 2020-04-06 DIAGNOSIS — Y92009 Unspecified place in unspecified non-institutional (private) residence as the place of occurrence of the external cause: Secondary | ICD-10-CM | POA: Diagnosis not present

## 2020-04-06 LAB — URINALYSIS, COMPLETE (UACMP) WITH MICROSCOPIC
Bacteria, UA: NONE SEEN
Bilirubin Urine: NEGATIVE
Glucose, UA: NEGATIVE mg/dL
Hgb urine dipstick: NEGATIVE
Ketones, ur: NEGATIVE mg/dL
Leukocytes,Ua: NEGATIVE
Nitrite: NEGATIVE
Protein, ur: NEGATIVE mg/dL
Specific Gravity, Urine: 1.017 (ref 1.005–1.030)
pH: 5 (ref 5.0–8.0)

## 2020-04-06 MED ORDER — ACETAMINOPHEN 325 MG PO TABS
650.0000 mg | ORAL_TABLET | Freq: Once | ORAL | Status: AC
  Administered 2020-04-06: 650 mg via ORAL
  Filled 2020-04-06: qty 2

## 2020-04-06 NOTE — ED Provider Notes (Signed)
[facility] Emergency Department Provider Note  ____________________________________________   First MD Initiated Contact with Patient 04/06/20 1727     (approximate)  I have reviewed the triage vital signs and the nursing notes.   HISTORY  Chief Complaint Fall  HPI Shelly Freeman is a 69 y.o. female who presents emergency department today for evaluation of acute fall.  She is seen with the assistance of a medical Spanish interpreter.  The patient states that she was using a Rollator, but had it backwards with the handles facing away from her when she fell on concrete flooring with her weight onto her right knee.  She also caught her upper body with her bilateral arms to avoid hitting her head.  She did not hit her head, denies headache, loss of consciousness or neck pain.  She states that she has had multiple falls in the last month for unknown cause.  Today is the first day that she has injured herself when falling.  She primarily complains of right knee pain rated at a 9/10 with some mild pain in her bilateral shoulders.  She has treated her pain with ibuprofen and Celebrex without improvement.  She has been ambulatory since the fall with her cane without difficulty.  She has known osteoarthritis and sees rheumatology with her next appointment being in 3 weeks.         Past Medical History:  Diagnosis Date  . Arthritis   . Collagen vascular disease ([place])   . COPD (chronic obstructive pulmonary disease) ([place])   . DDD (degenerative disc disease), lumbar 05/14/2016  . Depression   . Dizziness   . Dyspnea   . GERD (gastroesophageal reflux disease)   . Heart valve problem    per grandaughter need surgery but they told her may not survive;  [name] History of kidney stones   . Hypertension   . Hypothyroidism   . Lupus ([place])   . Neuromuscular disorder ([place])   . Sleep apnea    no CPAP  . Thyroid disease   . Vitamin D deficiency 05/14/2016    Patient  Active Problem List   Diagnosis Date Noted  . Morbid obesity ([place]) 05/23/2019  . Acute lower UTI 05/23/2019  . Right rib fracture 05/23/2019  . SIRS (systemic inflammatory response syndrome) ([place]) 05/23/2019  . Bacteremia due to Gram-negative bacteria 05/23/2019  . Acute bronchiolitis 01/28/2019  . Left anterior knee pain 10/24/2016  . Right upper quadrant pain 10/24/2016  . Urination pain 06/26/2016  . DDD (degenerative disc disease), lumbar 05/14/2016  . Lipoma of skin 05/14/2016  . Vitamin D deficiency, unspecified 05/14/2016  . Major neurocognitive disorder, due to vascular disease, with behavioral disturbance, mild ([place]) 04/15/2016  . Severe recurrent major depression with psychotic features ([place]) 04/04/2016  . Lupus ([place]) 04/04/2016  . GERD (gastroesophageal reflux disease) 04/04/2016  . COPD with acute bronchitis ([place]) 04/04/2016  . Acute respiratory failure with hypoxia ([place]) 04/03/2016  . Encephalopathy, metabolic 53/66/4403  . Hypokalemia 04/03/2016  . Pain in limb 04/03/2016  . Hypothyroidism 04/03/2016  . Essential hypertension 04/03/2016  . Intentional opiate overdose ([place]) 03/30/2016  . Age related osteoporosis 03/19/2016  . Chronic midline low back pain without sciatica 03/19/2016  . Encounter for long-term (current) use of high-risk medication 03/19/2016  . Primary osteoarthritis of both knees 03/19/2016    Past Surgical History:  Procedure Laterality Date  . APPENDECTOMY    . BREAST BIOPSY Left    neg  . CESAREAN SECTION    .  COLONOSCOPY WITH PROPOFOL N/A 07/01/2017   Procedure: COLONOSCOPY WITH PROPOFOL;  Surgeon: Manya Silvas, MD;  Location[facility] ENDOSCOPY;  Service: Endoscopy;  Laterality: N/A;  . ESOPHAGOGASTRODUODENOSCOPY (EGD) WITH PROPOFOL N/A 07/01/2017   Procedure: ESOPHAGOGASTRODUODENOSCOPY (EGD) WITH PROPOFOL;  Surgeon: Manya Silvas, MD;  Location: [facility] ENDOSCOPY;  Service: Endoscopy;  Laterality: N/A;  . GASTRIC BYPASS    . LITHOTRIPSY     . TUBAL LIGATION      Prior to Admission medications   Medication Sig Start Date End Date Taking? Authorizing Provider  acetaminophen (TYLENOL) 325 MG tablet Take 650 mg by mouth every 6 (six) hours as needed.    [provider]  albuterol (VENTOLIN HFA) 108 (90 Base) MCG/ACT inhaler Inhale 2 puffs into the lungs every 6 (six) hours as needed for wheezing or shortness of breath. 06/09/19   Arta Silence, MD  alendronate (FOSAMAX) 70 MG tablet Take 70 mg by mouth once a week. Take with a full glass of water on an empty stomach.    [provider]  B Complex Vitamins (B COMPLEX PO) Take 1 tablet by mouth daily.     [provider]  busPIRone (BUSPAR) 10 MG tablet Take 10 mg by mouth daily.    [provider]  diclofenac Sodium (VOLTAREN) 1 % GEL Apply 2 g topically 4 (four) times daily.  05/19/19   [provider]  DULoxetine (CYMBALTA) 60 MG capsule Take 1 capsule (60 mg total) by mouth daily. Patient taking differently: Take 30 mg by mouth daily.  04/17/16   Pucilowska, Herma Ard B, MD  ferrous sulfate 325 (65 FE) MG tablet Take 325 mg by mouth daily.    [provider]  folic acid (FOLVITE) 1 MG tablet Take 1 mg by mouth daily.    [provider]  gabapentin (NEURONTIN) 800 MG tablet Take 800 mg by mouth 2 (two) times daily.    [provider]  hydroxychloroquine (PLAQUENIL) 200 MG tablet Take 2 tablets (400 mg total) by mouth daily. Patient taking differently: Take 200 mg by mouth 2 (two) times daily.  04/04/16   Theodoro Grist, MD  ibuprofen (ADVIL) 200 MG tablet Take 400-800 mg by mouth every 6 (six) hours as needed for fever, mild pain or moderate pain.    [provider]  ipratropium-albuterol (DUONEB) 0.5-2.5 (3) MG/3ML SOLN Take 3 mLs by nebulization every 6 (six) hours as needed (Wheezing). 01/30/19   Henreitta Leber, MD  isosorbide mononitrate (IMDUR) 30 MG 24 hr tablet Take 30 mg by mouth daily.     [provider]  levothyroxine (SYNTHROID, LEVOTHROID) 75 MCG tablet Take 75 mcg by mouth daily before breakfast.    [provider]  LORazepam (ATIVAN) 0.5 MG tablet Take 0.5 mg by mouth at bedtime.    [provider]  metoprolol succinate (TOPROL-XL) 25 MG 24 hr tablet Take 25 mg by mouth daily.     [provider]  OLANZapine (ZYPREXA) 15 MG tablet Take 1 tablet (15 mg total) by mouth at bedtime. 04/16/16   Pucilowska, Jolanta B, MD  ondansetron (ZOFRAN) 4 MG tablet Take 1 tablet (4 mg total) by mouth daily as needed for nausea or vomiting. Patient not taking: Reported on 03/23/2020 11/15/19   Harvest Dark, MD  pantoprazole (PROTONIX) 40 MG tablet Take 40 mg by mouth 2 (two) times daily.     [provider]  traZODone (DESYREL) 100 MG tablet Take 100 mg by mouth at bedtime.  [provider]    Allergies Patient has no known allergies.  Family History  Problem Relation Age of Onset  . Breast cancer Maternal Aunt   . Breast cancer Cousin     Social History Social History   Tobacco Use  . Smoking status: Never Smoker  . Smokeless tobacco: Never Used  Vaping Use  . Vaping Use: Never used  Substance Use Topics  . Alcohol use: No  . Drug use: No    Review of Systems Constitutional: No fever/chills Eyes: No visual changes. ENT: No sore throat. Cardiovascular: Denies chest pain. Respiratory: Denies shortness of breath. Gastrointestinal: No abdominal pain.  No nausea, no vomiting.  No diarrhea.  No constipation. Genitourinary: Negative for dysuria. Musculoskeletal: + Right knee pain, shoulder pain, negative for back pain. Skin: Negative for rash. Neurological: Negative for headaches, focal weakness or numbness.   ____________________________________________   PHYSICAL EXAM:  VITAL SIGNS: ED Triage Vitals  Enc Vitals Group     BP 04/06/20 1646 (!) 154/70     Pulse Rate 04/06/20 1646 76     Resp 04/06/20 1646  18     Temp 04/06/20 1646 98.4 F (36.9 C)     Temp Source 04/06/20 1646 Oral     SpO2 04/06/20 1646 98 %     Weight 04/06/20 1647 213 lb (96.6 kg)     Height 04/06/20 1647 5\' 4"  (1.626 m)     Head Circumference --      Peak Flow --      Pain Score 04/06/20 1646 9     Pain Loc --      Pain Edu? --      Excl. in [place]? --     Constitutional: Alert and oriented. Well appearing and in no acute distress. Eyes: Conjunctivae are normal. PERRL. EOMI. Head: Atraumatic. Nose: No congestion/rhinnorhea. Mouth/Throat: Mucous membranes are moist.   Neck: No stridor.  There is no tenderness to palpation of the midline of the cervical spine, no step-off deformities and no paraspinal tenderness.  Cardiovascular: Normal rate, regular rhythm.  Good peripheral circulation. Respiratory: Normal respiratory effort.  No retractions.  Musculoskeletal: There is tenderness to palpation diffusely across the right knee with increased tenderness with palpation of the medial joint line.  This medial sided tenderness does extend into the middle third of the tibia.  No tenderness to palpation of the ankle, full ankle range of motion.  The patient has knee range of motion equal to the contralateral side.  Mild soft tissue swelling noted.  Small, 1 cm x 1 cm abrasion to the anterior aspect of the knee.  Patient is able to lift leg with knee extended, extensor mechanism intact.  Distal pulses 2+, sensation intact.  Patient is able to raise her bilateral shoulders symmetrically in flexion and abduction.  Symmetric grip strength bilaterally without difficulty. Neurologic:  Normal speech and language.  Cranial nerves II through XII grossly intact.  No gross focal neurologic deficits are appreciated. No gait instability. Skin:  Skin is warm, dry and intact except small knee abrasion as noted above. No rash noted. Psychiatric: Mood and affect are normal. Speech and behavior are  normal.  ____________________________________________   LABS (all labs ordered are listed, but only abnormal results are displayed)  Labs Reviewed  URINALYSIS, COMPLETE (UACMP) WITH MICROSCOPIC - Abnormal; Notable for the following components:      Result Value   Color, Urine YELLOW (*)    APPearance HAZY (*)    All other  components within normal limits    ____________________________________________  RADIOLOGY I, Marlana Salvage, personally viewed and evaluated these images (plain radiographs) as part of my medical decision making, as well as reviewing the written report by the radiologist.  ED provider interpretation: Degenerative changes of the right knee without any evidence of acute fracture.  Official radiology report(s): DG Tibia/Fibula Right  Result Date: 04/06/2020 CLINICAL DATA:  Leg pain, fall EXAM: RIGHT TIBIA AND FIBULA - 2 VIEW COMPARISON:  Contemporary knee radiograph, knee radiograph 03/19/2016 (report only) FINDINGS: The tibia and fibula are intact. There are mild tricompartmental degenerative changes of the knee. 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 Reportedly present on comparison from 2017. No clear fracture or traumatic malalignment of the ankle though poorly assessed given nonstandard, nondedicated views. No sizable effusion. Soft tissues are unremarkable aside from vascular calcium. IMPRESSION: 1. No acute fracture or traumatic malalignment. Acute soft tissue abnormality. 2. Mild tricompartmental degenerative changes of the knee. 3. 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 4. Ankle alignment is grossly preserved though incompletely assessed on nondedicated views. 5. Vascular calcium. Electronically Signed   By: Lovena Le M.D.   On: 04/06/2020 18:54   DG Knee  Complete 4 Views Right  Result Date: 04/06/2020 CLINICAL DATA:  69 year old female with fall and trauma to the right knee. EXAM: RIGHT KNEE - COMPLETE 4+ VIEW COMPARISON:  None. FINDINGS: There is no acute fracture or dislocation. Mild arthritic changes of the knee. There is a small suprapatellar effusion. The soft tissues are unremarkable. IMPRESSION: 1. No acute fracture or dislocation. 2. Small suprapatellar effusion. Electronically Signed   By: Anner Crete M.D.   On: 04/06/2020 17:36    ____________________________________________   INITIAL IMPRESSION / ASSESSMENT AND PLAN / ED COURSE  As part of my medical decision making, I reviewed the following data within the [place] notes reviewed and incorporated, Radiograph reviewed and Notes from prior ED visits        Patient is a 69 year old female who presents to the emergency department today following mechanical fall.  The patient states that she fell forward when she lost control with her Rollator and fell with most of her weight onto her right knee.  This is the site of the most on her pain.  See HPI for further details.  On physical exam the patient does have tenderness grossly about the right knee which is more specific on the medial side with extension down into the middle third of the tibia.  The patient has range of motion equal to that on the contralateral side without difficulty and distally patient is neurovascularly intact.  Exam of the bilateral shoulders does not reveal any tenderness and the patient has equal range of motion bilaterally.  X-rays of the right knee and tib-fib demonstrate degenerative changes of the knee without any acute fractures.  The patient has not been able to weight-bear on her own since the fall with out difficulty.  A urinalysis was run for the patient given that she did complain of some frequency and there is no evidence of UTI to explain any contribution to her fall.  The  patient already has prescription for Celebrex and is followed regularly by rheumatology.  Recommended that she continue taking the Celebrex and Tylenol.  Recommended against any narcotics at this time  given no fracture as well as the potential for increased risk for falls.  The patient is understanding and is amenable with this plan.  She will return to the emergency department for any acute worsening or follow-up with primary care or rheumatology if her pain persist.      ____________________________________________   FINAL CLINICAL IMPRESSION(S) / ED DIAGNOSES  Final diagnoses:  Acute pain of right knee  Fall in home, initial encounter  Primary osteoarthritis of right knee     ED Discharge Orders    None      *Please note:  Shelly Freeman was evaluated in Emergency Department on 04/06/2020 for the symptoms described in the history of present illness. She was evaluated in the context of the global COVID-19 pandemic, which necessitated consideration that the patient might be at risk for infection with the SARS-CoV-2 virus that causes COVID-19. Institutional protocols and algorithms that pertain to the evaluation of patients at risk for COVID-19 are in a state of rapid change based on information released by regulatory bodies including the CDC and federal and state organizations. These policies and algorithms were followed during the patient's care in the ED.  Some ED evaluations and interventions may be delayed as a result of limited staffing during and the pandemic.*   Note:  This document was prepared using Dragon voice recognition software and may include unintentional dictation errors.    Marlana Salvage, PA 04/06/20 2244    Blake Divine, MD 04/06/20 [phone]

## 2020-04-06 NOTE — ED Triage Notes (Signed)
Pt here with a fall today. Pt c/o of right knee pain a dn bilateral shoulder pain. Pt NAD in triage.

## 2020-04-06 NOTE — ED Triage Notes (Signed)
First Nurse Note:  Arrives via [facility].  Per report, patient slipped on kitchen floor while walking with walker.  Patient slipped on fell down.  Only c/o right knee pain.  Patietn ambulatory at home, was able to walk to EMS stretcher.   AOx3.  NAD

## 2020-04-06 NOTE — Discharge Instructions (Addendum)
Continue your celebrex and 1000mg  of Tylenol every 6 hours as needed.

## 2020-04-06 NOTE — ED Notes (Signed)
Interpreter called

## 2020-08-17 ENCOUNTER — Other Ambulatory Visit: Payer: Self-pay | Admitting: Orthopedic Surgery

## 2020-08-17 DIAGNOSIS — M1712 Unilateral primary osteoarthritis, left knee: Secondary | ICD-10-CM

## 2020-08-23 ENCOUNTER — Other Ambulatory Visit: Payer: Self-pay | Admitting: Family Medicine

## 2020-08-23 DIAGNOSIS — M170 Bilateral primary osteoarthritis of knee: Secondary | ICD-10-CM

## 2020-08-28 ENCOUNTER — Inpatient Hospital Stay
Admission: EM | Admit: 2020-08-28 | Discharge: 2020-08-30 | DRG: 177 | Disposition: A | Discharge status: Home / Self Care | Payer: Medicare (Managed Care) | Attending: Internal Medicine | Admitting: Internal Medicine

## 2020-08-28 ENCOUNTER — Emergency Department: Payer: Medicare (Managed Care)

## 2020-08-28 ENCOUNTER — Other Ambulatory Visit: Payer: Self-pay

## 2020-08-28 DIAGNOSIS — M549 Dorsalgia, unspecified: Secondary | ICD-10-CM | POA: Diagnosis not present

## 2020-08-28 DIAGNOSIS — N390 Urinary tract infection, site not specified: Secondary | ICD-10-CM

## 2020-08-28 DIAGNOSIS — E66813 Obesity, class 3: Secondary | ICD-10-CM

## 2020-08-28 DIAGNOSIS — J440 Chronic obstructive pulmonary disease with acute lower respiratory infection: Secondary | ICD-10-CM | POA: Diagnosis present

## 2020-08-28 DIAGNOSIS — J690 Pneumonitis due to inhalation of food and vomit: Principal | ICD-10-CM | POA: Diagnosis present

## 2020-08-28 DIAGNOSIS — Z6841 Body Mass Index (BMI) 40.0 and over, adult: Secondary | ICD-10-CM

## 2020-08-28 DIAGNOSIS — J449 Chronic obstructive pulmonary disease, unspecified: Secondary | ICD-10-CM

## 2020-08-28 DIAGNOSIS — M1990 Unspecified osteoarthritis, unspecified site: Secondary | ICD-10-CM | POA: Diagnosis present

## 2020-08-28 DIAGNOSIS — M6282 Rhabdomyolysis: Secondary | ICD-10-CM | POA: Diagnosis present

## 2020-08-28 DIAGNOSIS — I10 Essential (primary) hypertension: Secondary | ICD-10-CM | POA: Diagnosis present

## 2020-08-28 DIAGNOSIS — M329 Systemic lupus erythematosus, unspecified: Secondary | ICD-10-CM | POA: Diagnosis present

## 2020-08-28 DIAGNOSIS — Z9181 History of falling: Secondary | ICD-10-CM

## 2020-08-28 DIAGNOSIS — N179 Acute kidney failure, unspecified: Secondary | ICD-10-CM

## 2020-08-28 DIAGNOSIS — W06XXXA Fall from bed, initial encounter: Secondary | ICD-10-CM | POA: Diagnosis present

## 2020-08-28 DIAGNOSIS — Z87442 Personal history of urinary calculi: Secondary | ICD-10-CM

## 2020-08-28 DIAGNOSIS — Z20822 Contact with and (suspected) exposure to covid-19: Secondary | ICD-10-CM | POA: Diagnosis present

## 2020-08-28 DIAGNOSIS — J189 Pneumonia, unspecified organism: Principal | ICD-10-CM

## 2020-08-28 DIAGNOSIS — J9601 Acute respiratory failure with hypoxia: Secondary | ICD-10-CM | POA: Diagnosis present

## 2020-08-28 DIAGNOSIS — IMO0002 Reserved for concepts with insufficient information to code with codable children: Secondary | ICD-10-CM | POA: Diagnosis present

## 2020-08-28 DIAGNOSIS — Z9884 Bariatric surgery status: Secondary | ICD-10-CM

## 2020-08-28 DIAGNOSIS — W19XXXA Unspecified fall, initial encounter: Secondary | ICD-10-CM

## 2020-08-28 DIAGNOSIS — E039 Hypothyroidism, unspecified: Secondary | ICD-10-CM | POA: Diagnosis present

## 2020-08-28 DIAGNOSIS — Z87440 Personal history of urinary (tract) infections: Secondary | ICD-10-CM

## 2020-08-28 DIAGNOSIS — S2249XA Multiple fractures of ribs, unspecified side, initial encounter for closed fracture: Secondary | ICD-10-CM | POA: Diagnosis present

## 2020-08-28 DIAGNOSIS — Y92009 Unspecified place in unspecified non-institutional (private) residence as the place of occurrence of the external cause: Secondary | ICD-10-CM

## 2020-08-28 DIAGNOSIS — Z803 Family history of malignant neoplasm of breast: Secondary | ICD-10-CM

## 2020-08-28 LAB — COMPREHENSIVE METABOLIC PANEL
ALT: 17 U/L (ref 0–44)
AST: 24 U/L (ref 15–41)
Albumin: 3.3 g/dL — ABNORMAL LOW (ref 3.5–5.0)
Alkaline Phosphatase: 72 U/L (ref 38–126)
Anion gap: 6 (ref 5–15)
BUN: 23 mg/dL (ref 8–23)
CO2: 23 mmol/L (ref 22–32)
Calcium: 8.8 mg/dL — ABNORMAL LOW (ref 8.9–10.3)
Chloride: 109 mmol/L (ref 98–111)
Creatinine, Ser: 1.07 mg/dL — ABNORMAL HIGH (ref 0.44–1.00)
GFR, Estimated: 57 mL/min — ABNORMAL LOW (ref >60)
Glucose, Bld: 80 mg/dL (ref 70–99)
Potassium: 4.9 mmol/L (ref 3.5–5.1)
Sodium: 138 mmol/L (ref 135–145)
Total Bilirubin: 0.6 mg/dL (ref 0.3–1.2)
Total Protein: 5.8 g/dL — ABNORMAL LOW (ref 6.5–8.1)

## 2020-08-28 LAB — URINALYSIS, COMPLETE (UACMP) WITH MICROSCOPIC
Bacteria, UA: NONE SEEN
Bilirubin Urine: NEGATIVE
Glucose, UA: NEGATIVE mg/dL
Hgb urine dipstick: NEGATIVE
Ketones, ur: NEGATIVE mg/dL
Leukocytes,Ua: NEGATIVE
Nitrite: POSITIVE — AB
Protein, ur: 30 mg/dL — AB
Specific Gravity, Urine: 1.02 (ref 1.005–1.030)
WBC, UA: >50 WBC/hpf — ABNORMAL HIGH (ref 0–5)
pH: 5 (ref 5.0–8.0)

## 2020-08-28 LAB — CBC WITH DIFFERENTIAL/PLATELET
Abs Immature Granulocytes: 0.05 10*3/uL (ref 0.00–0.07)
Basophils Absolute: 0.1 10*3/uL (ref 0.0–0.1)
Basophils Relative: 1 %
Eosinophils Absolute: 0.2 10*3/uL (ref 0.0–0.5)
Eosinophils Relative: 2 %
HCT: 39 % (ref 36.0–46.0)
Hemoglobin: 11.9 g/dL — ABNORMAL LOW (ref 12.0–15.0)
Immature Granulocytes: 1 %
Lymphocytes Relative: 39 %
Lymphs Abs: 3.1 10*3/uL (ref 0.7–4.0)
MCH: 30.9 pg (ref 26.0–34.0)
MCHC: 30.5 g/dL (ref 30.0–36.0)
MCV: 101.3 fL — ABNORMAL HIGH (ref 80.0–100.0)
Monocytes Absolute: 0.6 10*3/uL (ref 0.1–1.0)
Monocytes Relative: 7 %
Neutro Abs: 4 10*3/uL (ref 1.7–7.7)
Neutrophils Relative %: 50 %
Platelets: 161 10*3/uL (ref 150–400)
RBC: 3.85 MIL/uL — ABNORMAL LOW (ref 3.87–5.11)
RDW: 14.2 % (ref 11.5–15.5)
WBC: 7.9 10*3/uL (ref 4.0–10.5)
nRBC: 0 % (ref 0.0–0.2)

## 2020-08-28 LAB — RESP PANEL BY RT-PCR (FLU A&B, COVID) ARPGX2
Influenza A by PCR: NEGATIVE
Influenza B by PCR: NEGATIVE
SARS Coronavirus 2 by RT PCR: NEGATIVE

## 2020-08-28 LAB — TROPONIN I (HIGH SENSITIVITY): Troponin I (High Sensitivity): <2 ng/L (ref <18)

## 2020-08-28 LAB — CK: Total CK: 248 U/L — ABNORMAL HIGH (ref 38–234)

## 2020-08-28 MED ORDER — ONDANSETRON HCL 4 MG/2ML IJ SOLN
4.0000 mg | Freq: Once | INTRAMUSCULAR | Status: AC
  Administered 2020-08-28: 4 mg via INTRAVENOUS
  Filled 2020-08-28: qty 2

## 2020-08-28 MED ORDER — FENTANYL CITRATE (PF) 100 MCG/2ML IJ SOLN
50.0000 ug | Freq: Once | INTRAMUSCULAR | Status: AC
  Administered 2020-08-28: 50 ug via INTRAVENOUS
  Filled 2020-08-28: qty 2

## 2020-08-28 MED ORDER — IOHEXOL 350 MG/ML SOLN
100.0000 mL | Freq: Once | INTRAVENOUS | Status: AC | PRN
  Administered 2020-08-28: 100 mL via INTRAVENOUS

## 2020-08-28 NOTE — ED Triage Notes (Signed)
Pt arrives from home via [facility] s/p unwitnessed fall after rolling out of bed- pt believes she had +LOC and now c/o back pain and R parietal region pain -- no obvious deformities, ecchymosis, lacerations.  Pt also reports mech fall one day prior to arrival.  States recently started on ABX for bladder infection.  (NOTE- THIS PATIENT IS [employer]- TRANSLATER [id number] USED FOR TRIAGE VIA REMOTE SERVICE)

## 2020-08-28 NOTE — ED Provider Notes (Incomplete)
[facility] Emergency Department Provider Note  ____________________________________________   Event Date/Time   First MD Initiated Contact with Patient 08/28/20 2117     (approximate)  I have reviewed the triage vital signs and the nursing notes.   HISTORY  Chief Complaint Fall (Pt arrives from home via [facility] s/p unwitnessed fall out of bed - now c/o back pain.  Pt "thinks I fainted" and reports hitting head along with back.   )    HPI Shelly Freeman is a 69 y.o. female with hypertension, arthritis who comes in for fall. Pt reports having a fall that happened today.  She fell off the bed. She reports her back is hurting. She reports she did hit her head. She has LOC. She was rolling over in bed then fell out. She also fell yesterday she reports.  She also reports she tripped on walker yesterday.  The back pain is 7/10, worse with movement better with rest.  She has not walked since the fall.  Patient states that she also has abdominal pain in her lower abdomen that is been there for the past couple days.  Is on the right side of her abdomen and she reports that it severe.  She also reports some mild shortness of breath as well that is been going on for the past few days.   She does live by herself.  She uses a walker. She reports being on antibiotics for 3 days for UTI. Still having dysuria.            Past Medical History:  Diagnosis Date  . Arthritis   . Collagen vascular disease ([place])   . COPD (chronic obstructive pulmonary disease) ([place])   . DDD (degenerative disc disease), lumbar 05/14/2016  . Depression   . Dizziness   . Dyspnea   . GERD (gastroesophageal reflux disease)   . Heart valve problem    per grandaughter need surgery but they told her may not survive;  [name] History of kidney stones   . Hypertension   . Hypothyroidism   . Lupus ([place])   . Neuromuscular disorder ([place])   . Sleep apnea    no CPAP  . Thyroid disease   .  Vitamin D deficiency 05/14/2016    Patient Active Problem List   Diagnosis Date Noted  . Morbid obesity ([place]) 05/23/2019  . Acute lower UTI 05/23/2019  . Right rib fracture 05/23/2019  . SIRS (systemic inflammatory response syndrome) ([place]) 05/23/2019  . Bacteremia due to Gram-negative bacteria 05/23/2019  . Acute bronchiolitis 01/28/2019  . Left anterior knee pain 10/24/2016  . Right upper quadrant pain 10/24/2016  . Urination pain 06/26/2016  . DDD (degenerative disc disease), lumbar 05/14/2016  . Lipoma of skin 05/14/2016  . Vitamin D deficiency, unspecified 05/14/2016  . Major neurocognitive disorder, due to vascular disease, with behavioral disturbance, mild ([place]) 04/15/2016  . Severe recurrent major depression with psychotic features ([place]) 04/04/2016  . Lupus ([place]) 04/04/2016  . GERD (gastroesophageal reflux disease) 04/04/2016  . COPD with acute bronchitis ([place]) 04/04/2016  . Acute respiratory failure with hypoxia ([place]) 04/03/2016  . Encephalopathy, metabolic 0000000  . Hypokalemia 04/03/2016  . Pain in limb 04/03/2016  . Hypothyroidism 04/03/2016  . Essential hypertension 04/03/2016  . Intentional opiate overdose ([place]) 03/30/2016  . Age related osteoporosis 03/19/2016  . Chronic midline low back pain without sciatica 03/19/2016  . Encounter for long-term (current) use of high-risk medication 03/19/2016  . Primary osteoarthritis of both knees  03/19/2016    Past Surgical History:  Procedure Laterality Date  . APPENDECTOMY    . BREAST BIOPSY Left    neg  . CESAREAN SECTION    . COLONOSCOPY WITH PROPOFOL N/A 07/01/2017   Procedure: COLONOSCOPY WITH PROPOFOL;  Surgeon: Manya Silvas, MD;  Location: [facility] ENDOSCOPY;  Service: Endoscopy;  Laterality: N/A;  . ESOPHAGOGASTRODUODENOSCOPY (EGD) WITH PROPOFOL N/A 07/01/2017   Procedure: ESOPHAGOGASTRODUODENOSCOPY (EGD) WITH PROPOFOL;  Surgeon: Manya Silvas, MD;  Location: [facility] ENDOSCOPY;  Service: Endoscopy;   Laterality: N/A;  . GASTRIC BYPASS    . LITHOTRIPSY    . TUBAL LIGATION      Prior to Admission medications   Medication Sig Start Date End Date Taking? Authorizing Provider  acetaminophen (TYLENOL) 325 MG tablet Take 650 mg by mouth every 6 (six) hours as needed.    [provider]  albuterol (VENTOLIN HFA) 108 (90 Base) MCG/ACT inhaler Inhale 2 puffs into the lungs every 6 (six) hours as needed for wheezing or shortness of breath. 06/09/19   Arta Silence, MD  alendronate (FOSAMAX) 70 MG tablet Take 70 mg by mouth once a week. Take with a full glass of water on an empty stomach.    [provider]  B Complex Vitamins (B COMPLEX PO) Take 1 tablet by mouth daily.     [provider]  busPIRone (BUSPAR) 10 MG tablet Take 10 mg by mouth daily.    [provider]  diclofenac Sodium (VOLTAREN) 1 % GEL Apply 2 g topically 4 (four) times daily.  05/19/19   [provider]  DULoxetine (CYMBALTA) 60 MG capsule Take 1 capsule (60 mg total) by mouth daily. Patient taking differently: Take 30 mg by mouth daily.  04/17/16   Pucilowska, Herma Ard B, MD  ferrous sulfate 325 (65 FE) MG tablet Take 325 mg by mouth daily.    [provider]  folic acid (FOLVITE) 1 MG tablet Take 1 mg by mouth daily.    [provider]  gabapentin (NEURONTIN) 800 MG tablet Take 800 mg by mouth 2 (two) times daily.    [provider]  hydroxychloroquine (PLAQUENIL) 200 MG tablet Take 2 tablets (400 mg total) by mouth daily. Patient taking differently: Take 200 mg by mouth 2 (two) times daily.  04/04/16   Theodoro Grist, MD  ibuprofen (ADVIL) 200 MG tablet Take 400-800 mg by mouth every 6 (six) hours as needed for fever, mild pain or moderate pain.    [provider]  ipratropium-albuterol (DUONEB) 0.5-2.5 (3) MG/3ML SOLN Take 3 mLs by nebulization every 6 (six) hours as needed (Wheezing). 01/30/19   Henreitta Leber, MD  isosorbide mononitrate  (IMDUR) 30 MG 24 hr tablet Take 30 mg by mouth daily.    [provider]  levothyroxine (SYNTHROID, LEVOTHROID) 75 MCG tablet Take 75 mcg by mouth daily before breakfast.    [provider]  LORazepam (ATIVAN) 0.5 MG tablet Take 0.5 mg by mouth at bedtime.    [provider]  metoprolol succinate (TOPROL-XL) 25 MG 24 hr tablet Take 25 mg by mouth daily.     [provider]  OLANZapine (ZYPREXA) 15 MG tablet Take 1 tablet (15 mg total) by mouth at bedtime. 04/16/16   Pucilowska, Jolanta B, MD  ondansetron (ZOFRAN) 4 MG tablet Take 1 tablet (4 mg total) by mouth daily as needed for nausea or vomiting. Patient not taking: Reported on 03/23/2020 11/15/19   Harvest Dark, MD  pantoprazole ([place]) 40  MG tablet Take 40 mg by mouth 2 (two) times daily.     [provider]  traZODone (DESYREL) 100 MG tablet Take 100 mg by mouth at bedtime.    [provider]    Allergies Patient has no known allergies.  Family History  Problem Relation Age of Onset  . Breast cancer Maternal Aunt   . Breast cancer Cousin     Social History Social History   Tobacco Use  . Smoking status: Never Smoker  . Smokeless tobacco: Never Used  Vaping Use  . Vaping Use: Never used  Substance Use Topics  . Alcohol use: No  . Drug use: No      Review of Systems Constitutional: No fever/chills, fall Eyes: No visual changes. ENT: No sore throat. Cardiovascular: Denies chest pain. Respiratory: Positive shortness of breath Gastrointestinal: Positive abdominal pain no nausea, no vomiting.  No diarrhea.  No constipation. Genitourinary: Negative for dysuria. Musculoskeletal: Positive back pain Skin: Negative for rash. Neurological: Negative for headaches, focal weakness or numbness. All other ROS negative ____________________________________________   PHYSICAL EXAM:  VITAL SIGNS: ED Triage Vitals  Enc Vitals Group     BP 08/28/20 2129 137/80      Pulse Rate 08/28/20 2129 67     Resp 08/28/20 2129 20     Temp 08/28/20 2129 97.9 F (36.6 C)     Temp Source 08/28/20 2129 Oral     SpO2 08/28/20 2129 94 %     Weight 08/28/20 2131 244 lb (110.7 kg)     Height 08/28/20 2131 5\' 1"  (1.549 m)     Head Circumference --      Peak Flow --      Pain Score 08/28/20 2125 7     Pain Loc --      Pain Edu? --      Excl. in [place]? --     Constitutional: Alert and oriented. Well appearing and in no acute distress. Eyes: Conjunctivae are normal. EOMI. Head: Atraumatic. Nose: No congestion/rhinnorhea. Mouth/Throat: Mucous membranes are moist.   Neck: No stridor. Trachea Midline. FROM Cardiovascular: Normal rate, regular rhythm. Grossly normal heart sounds.  Good peripheral circulation. Respiratory: Normal respiratory effort.  No retractions. Lungs CTAB. Gastrointestinal: Tenderness in the right lower quadrant no distention. No abdominal bruits.  Musculoskeletal: No lower extremity tenderness nor edema.  No joint effusions.  Mild abrasions on elbows but full range of motion Neurologic:  Normal speech and language. No gross focal neurologic deficits are appreciated.  Skin:  Skin is warm, dry and intact. No rash noted. Psychiatric: Mood and affect are normal. Speech and behavior are normal. GU: Deferred  Back pain ____________________________________________   LABS (all labs ordered are listed, but only abnormal results are displayed)  Labs Reviewed  CBC WITH DIFFERENTIAL/PLATELET - Abnormal; Notable for the following components:      Result Value   RBC 3.85 (*)    Hemoglobin 11.9 (*)    MCV 101.3 (*)    All other components within normal limits  COMPREHENSIVE METABOLIC PANEL - Abnormal; Notable for the following components:   Creatinine, Ser 1.07 (*)    Calcium 8.8 (*)    Total Protein 5.8 (*)    Albumin 3.3 (*)    GFR, Estimated 57 (*)    All other components within normal limits  CK - Abnormal; Notable for the following components:    Total CK 248 (*)    All other components within normal limits  URINALYSIS, COMPLETE (UACMP)  WITH MICROSCOPIC - Abnormal; Notable for the following components:   Color, Urine AMBER (*)    APPearance CLEAR (*)    Protein, ur 30 (*)    Nitrite POSITIVE (*)    WBC, UA >50 (*)    All other components within normal limits  RESP PANEL BY RT-PCR (FLU A&B, COVID) ARPGX2  I-STAT CREATININE (MANUAL ENTRY)  TROPONIN I (HIGH SENSITIVITY)  TROPONIN I (HIGH SENSITIVITY)   ____________________________________________   ED ECG REPORT I, Vanessa Mack, the attending physician, personally viewed and interpreted this ECG.   ____________________________________________  RADIOLOGY I, Vanessa Basco, personally viewed and evaluated these images (plain radiographs) as part of my medical decision making, as well as reviewing the written report by the radiologist.  ED MD interpretation: Bilateral interstitial markings  Official radiology report(s): DG Chest Portable 1 View  Result Date: 08/28/2020 CLINICAL DATA:  Shortness of breath. Unwitnessed fall after rolling out of bed. EXAM: PORTABLE CHEST 1 VIEW COMPARISON:  Chest x-ray 06/09/2019, CT chest 05/21/2019 FINDINGS: The heart size and mediastinal contours are unchanged. No focal consolidation. Bilateral lower lung zone linear atelectasis versus scarring. Similar appearing coarsened interstitial markings with no overt pulmonary edema. No pleural effusion. No pneumothorax. No acute osseous abnormality.  Old healed right rib fractures. IMPRESSION: Similar appearing coarsened interstitial markings with no definite focal consolidation or acute cardiopulmonary abnormality. Electronically Signed   By: Iven Finn M.D.   On: 08/28/2020 22:18    ____________________________________________   PROCEDURES  Procedure(s) performed (including Critical Care):  Procedures   ____________________________________________   INITIAL IMPRESSION / ASSESSMENT AND  PLAN / ED COURSE  Shelly Freeman was evaluated in Emergency Department on 08/28/2020 for the symptoms described in the history of present illness. She was evaluated in the context of the global COVID-19 pandemic, which necessitated consideration that the patient might be at risk for infection with the SARS-CoV-2 virus that causes COVID-19. Institutional protocols and algorithms that pertain to the evaluation of patients at risk for COVID-19 are in a state of rapid change based on information released by regulatory bodies including the CDC and federal and state organizations. These policies and algorithms were followed during the patient's care in the ED.    Patient 70 year old who comes in for 2 falls.  Did not sound like syncope but given she did lose consciousness will get EKG and keep on the cardiac monitor.  Will get work-up to evaluate for electrolytes, AKI, UTI, CT scan to evaluate for PE, appendicitis, intracranial hemorrhage.  We will give something to help with pain including some IV fentanyl and some IV Zofran.  Patient handed off to oncoming team pending labs and CT imaging and reevaluation.       ____________________________________________   FINAL CLINICAL IMPRESSION(S) / ED DIAGNOSES   Final diagnoses:  None      MEDICATIONS GIVEN DURING THIS VISIT:  Medications - No data to display   ED Discharge Orders    None       Note:  This document was prepared using Dragon voice recognition software and may include unintentional dictation errors.

## 2020-08-28 NOTE — ED Provider Notes (Signed)
[facility] Emergency Department Provider Note  ____________________________________________   Event Date/Time   First MD Initiated Contact with Patient 08/28/20 2117     (approximate)  I have reviewed the triage vital signs and the nursing notes.   HISTORY  Chief Complaint Fall (Pt arrives from home via [facility] s/p unwitnessed fall out of bed - now c/o back pain.  Pt "thinks I fainted" and reports hitting head along with back.   )    HPI Shelly Freeman is a 70 y.o. female with hypertension, arthritis who comes in for fall. Pt reports having a fall that happened today.  She fell off the bed. She reports her back is hurting. She reports she did hit her head. She has LOC. She was rolling over in bed then fell out. She also fell yesterday she reports.  She also reports she tripped on walker yesterday.  The back pain is 7/10, worse with movement better with rest.  She has not walked since the fall.  Patient states that she also has abdominal pain in her lower abdomen that is been there for the past couple days.  Is on the right side of her abdomen and she reports that it severe.  She also reports some mild shortness of breath as well that is been going on for the past few days.   She does live by herself.  She uses a walker. She reports being on antibiotics for 3 days for UTI. Still having dysuria.            Past Medical History:  Diagnosis Date  . Arthritis   . Collagen vascular disease ([place])   . COPD (chronic obstructive pulmonary disease) ([place])   . DDD (degenerative disc disease), lumbar 05/14/2016  . Depression   . Dizziness   . Dyspnea   . GERD (gastroesophageal reflux disease)   . Heart valve problem    per grandaughter need surgery but they told her may not survive;  [name] History of kidney stones   . Hypertension   . Hypothyroidism   . Lupus ([place])   . Neuromuscular disorder ([place])   . Sleep apnea    no CPAP  . Thyroid disease   .  Vitamin D deficiency 05/14/2016    Patient Active Problem List   Diagnosis Date Noted  . Morbid obesity ([place]) 05/23/2019  . Acute lower UTI 05/23/2019  . Right rib fracture 05/23/2019  . SIRS (systemic inflammatory response syndrome) ([place]) 05/23/2019  . Bacteremia due to Gram-negative bacteria 05/23/2019  . Acute bronchiolitis 01/28/2019  . Left anterior knee pain 10/24/2016  . Right upper quadrant pain 10/24/2016  . Urination pain 06/26/2016  . DDD (degenerative disc disease), lumbar 05/14/2016  . Lipoma of skin 05/14/2016  . Vitamin D deficiency, unspecified 05/14/2016  . Major neurocognitive disorder, due to vascular disease, with behavioral disturbance, mild ([place]) 04/15/2016  . Severe recurrent major depression with psychotic features ([place]) 04/04/2016  . Lupus ([place]) 04/04/2016  . GERD (gastroesophageal reflux disease) 04/04/2016  . COPD with acute bronchitis ([place]) 04/04/2016  . Acute respiratory failure with hypoxia ([place]) 04/03/2016  . Encephalopathy, metabolic 0000000  . Hypokalemia 04/03/2016  . Pain in limb 04/03/2016  . Hypothyroidism 04/03/2016  . Essential hypertension 04/03/2016  . Intentional opiate overdose ([place]) 03/30/2016  . Age related osteoporosis 03/19/2016  . Chronic midline low back pain without sciatica 03/19/2016  . Encounter for long-term (current) use of high-risk medication 03/19/2016  . Primary osteoarthritis of both knees  03/19/2016    Past Surgical History:  Procedure Laterality Date  . APPENDECTOMY    . BREAST BIOPSY Left    neg  . CESAREAN SECTION    . COLONOSCOPY WITH PROPOFOL N/A 07/01/2017   Procedure: COLONOSCOPY WITH PROPOFOL;  Surgeon: Manya Silvas, MD;  Location: [facility] ENDOSCOPY;  Service: Endoscopy;  Laterality: N/A;  . ESOPHAGOGASTRODUODENOSCOPY (EGD) WITH PROPOFOL N/A 07/01/2017   Procedure: ESOPHAGOGASTRODUODENOSCOPY (EGD) WITH PROPOFOL;  Surgeon: Manya Silvas, MD;  Location: [facility] ENDOSCOPY;  Service: Endoscopy;   Laterality: N/A;  . GASTRIC BYPASS    . LITHOTRIPSY    . TUBAL LIGATION      Prior to Admission medications   Medication Sig Start Date End Date Taking? Authorizing Provider  acetaminophen (TYLENOL) 325 MG tablet Take 650 mg by mouth every 6 (six) hours as needed.    [provider]  albuterol (VENTOLIN HFA) 108 (90 Base) MCG/ACT inhaler Inhale 2 puffs into the lungs every 6 (six) hours as needed for wheezing or shortness of breath. 06/09/19   Arta Silence, MD  alendronate (FOSAMAX) 70 MG tablet Take 70 mg by mouth once a week. Take with a full glass of water on an empty stomach.    [provider]  B Complex Vitamins (B COMPLEX PO) Take 1 tablet by mouth daily.     [provider]  busPIRone (BUSPAR) 10 MG tablet Take 10 mg by mouth daily.    [provider]  diclofenac Sodium (VOLTAREN) 1 % GEL Apply 2 g topically 4 (four) times daily.  05/19/19   [provider]  DULoxetine (CYMBALTA) 60 MG capsule Take 1 capsule (60 mg total) by mouth daily. Patient taking differently: Take 30 mg by mouth daily.  04/17/16   Pucilowska, Herma Ard B, MD  ferrous sulfate 325 (65 FE) MG tablet Take 325 mg by mouth daily.    [provider]  folic acid (FOLVITE) 1 MG tablet Take 1 mg by mouth daily.    [provider]  gabapentin (NEURONTIN) 800 MG tablet Take 800 mg by mouth 2 (two) times daily.    [provider]  hydroxychloroquine (PLAQUENIL) 200 MG tablet Take 2 tablets (400 mg total) by mouth daily. Patient taking differently: Take 200 mg by mouth 2 (two) times daily.  04/04/16   Theodoro Grist, MD  ibuprofen (ADVIL) 200 MG tablet Take 400-800 mg by mouth every 6 (six) hours as needed for fever, mild pain or moderate pain.    [provider]  ipratropium-albuterol (DUONEB) 0.5-2.5 (3) MG/3ML SOLN Take 3 mLs by nebulization every 6 (six) hours as needed (Wheezing). 01/30/19   Henreitta Leber, MD  isosorbide mononitrate  (IMDUR) 30 MG 24 hr tablet Take 30 mg by mouth daily.    [provider]  levothyroxine (SYNTHROID, LEVOTHROID) 75 MCG tablet Take 75 mcg by mouth daily before breakfast.    [provider]  LORazepam (ATIVAN) 0.5 MG tablet Take 0.5 mg by mouth at bedtime.    [provider]  metoprolol succinate (TOPROL-XL) 25 MG 24 hr tablet Take 25 mg by mouth daily.     [provider]  OLANZapine (ZYPREXA) 15 MG tablet Take 1 tablet (15 mg total) by mouth at bedtime. 04/16/16   Pucilowska, Jolanta B, MD  ondansetron (ZOFRAN) 4 MG tablet Take 1 tablet (4 mg total) by mouth daily as needed for nausea or vomiting. Patient not taking: Reported on 03/23/2020 11/15/19   Harvest Dark, MD  pantoprazole ([place]) 40  MG tablet Take 40 mg by mouth 2 (two) times daily.     [provider]  traZODone (DESYREL) 100 MG tablet Take 100 mg by mouth at bedtime.    [provider]    Allergies Patient has no known allergies.  Family History  Problem Relation Age of Onset  . Breast cancer Maternal Aunt   . Breast cancer Cousin     Social History Social History   Tobacco Use  . Smoking status: Never Smoker  . Smokeless tobacco: Never Used  Vaping Use  . Vaping Use: Never used  Substance Use Topics  . Alcohol use: No  . Drug use: No      Review of Systems Constitutional: No fever/chills, fall Eyes: No visual changes. ENT: No sore throat. Cardiovascular: Denies chest pain. Respiratory: Positive shortness of breath Gastrointestinal: Positive abdominal pain no nausea, no vomiting.  No diarrhea.  No constipation. Genitourinary: Negative for dysuria. Musculoskeletal: Positive back pain Skin: Negative for rash. Neurological: Negative for headaches, focal weakness or numbness. All other ROS negative ____________________________________________   PHYSICAL EXAM:  VITAL SIGNS: ED Triage Vitals  Enc Vitals Group     BP 08/28/20 2129 137/80      Pulse Rate 08/28/20 2129 67     Resp 08/28/20 2129 20     Temp 08/28/20 2129 97.9 F (36.6 C)     Temp Source 08/28/20 2129 Oral     SpO2 08/28/20 2129 94 %     Weight 08/28/20 2131 244 lb (110.7 kg)     Height 08/28/20 2131 5\' 1"  (1.549 m)     Head Circumference --      Peak Flow --      Pain Score 08/28/20 2125 7     Pain Loc --      Pain Edu? --      Excl. in [place]? --     Constitutional: Alert and oriented. Well appearing and in no acute distress. Eyes: Conjunctivae are normal. EOMI. Head: Atraumatic. Nose: No congestion/rhinnorhea. Mouth/Throat: Mucous membranes are moist.   Neck: No stridor. Trachea Midline. FROM Cardiovascular: Normal rate, regular rhythm. Grossly normal heart sounds.  Good peripheral circulation. Respiratory: Normal respiratory effort.  No retractions. Lungs CTAB. Gastrointestinal: Tenderness in the right lower quadrant no distention. No abdominal bruits.  Musculoskeletal: No lower extremity tenderness nor edema.  No joint effusions.  Mild abrasions on elbows but full range of motion Neurologic:  Normal speech and language. No gross focal neurologic deficits are appreciated.  Skin:  Skin is warm, dry and intact. No rash noted. Psychiatric: Mood and affect are normal. Speech and behavior are normal. GU: Deferred  Back pain ____________________________________________   LABS (all labs ordered are listed, but only abnormal results are displayed)  Labs Reviewed  CBC WITH DIFFERENTIAL/PLATELET - Abnormal; Notable for the following components:      Result Value   RBC 3.85 (*)    Hemoglobin 11.9 (*)    MCV 101.3 (*)    All other components within normal limits  COMPREHENSIVE METABOLIC PANEL - Abnormal; Notable for the following components:   Creatinine, Ser 1.07 (*)    Calcium 8.8 (*)    Total Protein 5.8 (*)    Albumin 3.3 (*)    GFR, Estimated 57 (*)    All other components within normal limits  CK - Abnormal; Notable for the following components:    Total CK 248 (*)    All other components within normal limits  URINALYSIS, COMPLETE (UACMP)  WITH MICROSCOPIC - Abnormal; Notable for the following components:   Color, Urine AMBER (*)    APPearance CLEAR (*)    Protein, ur 30 (*)    Nitrite POSITIVE (*)    WBC, UA >50 (*)    All other components within normal limits  RESP PANEL BY RT-PCR (FLU A&B, COVID) ARPGX2  I-STAT CREATININE (MANUAL ENTRY)  TROPONIN I (HIGH SENSITIVITY)  TROPONIN I (HIGH SENSITIVITY)   ____________________________________________   ED ECG REPORT I, Vanessa [place], the attending physician, personally viewed and interpreted this ECG.  Sinus rate of 62, no ST elevation, no T wave inversions, normal intervals ____________________________________________  RADIOLOGY Robert Bellow, personally viewed and evaluated these images (plain radiographs) as part of my medical decision making, as well as reviewing the written report by the radiologist.  ED MD interpretation: Bilateral interstitial markings  Official radiology report(s): DG Chest Portable 1 View  Result Date: 08/28/2020 CLINICAL DATA:  Shortness of breath. Unwitnessed fall after rolling out of bed. EXAM: PORTABLE CHEST 1 VIEW COMPARISON:  Chest x-ray 06/09/2019, CT chest 05/21/2019 FINDINGS: The heart size and mediastinal contours are unchanged. No focal consolidation. Bilateral lower lung zone linear atelectasis versus scarring. Similar appearing coarsened interstitial markings with no overt pulmonary edema. No pleural effusion. No pneumothorax. No acute osseous abnormality.  Old healed right rib fractures. IMPRESSION: Similar appearing coarsened interstitial markings with no definite focal consolidation or acute cardiopulmonary abnormality. Electronically Signed   By: Iven Finn M.D.   On: 08/28/2020 22:18    ____________________________________________   PROCEDURES  Procedure(s) performed (including Critical Care):  .1-3 Lead EKG  Interpretation Performed by: Vanessa [place], MD Authorized by: Vanessa [place], MD     Interpretation: normal     ECG rate:  60s    ECG rate assessment: normal     Rhythm: sinus rhythm     Ectopy: none     Conduction: normal       ____________________________________________   INITIAL IMPRESSION / ASSESSMENT AND PLAN / ED COURSE  Shelly Freeman was evaluated in Emergency Department on 08/28/2020 for the symptoms described in the history of present illness. She was evaluated in the context of the global COVID-19 pandemic, which necessitated consideration that the patient might be at risk for infection with the SARS-CoV-2 virus that causes COVID-19. Institutional protocols and algorithms that pertain to the evaluation of patients at risk for COVID-19 are in a state of rapid change based on information released by regulatory bodies including the CDC and federal and state organizations. These policies and algorithms were followed during the patient's care in the ED.    Patient 71 year old who comes in for 2 falls.  Did not sound like syncope but given she did lose consciousness will get EKG and keep on the cardiac monitor.  Will get work-up to evaluate for electrolytes, AKI, UTI, CT scan to evaluate for PE, appendicitis, intracranial hemorrhage.  We will give something to help with pain including some IV fentanyl and some IV Zofran.  Patient handed off to oncoming team pending labs and CT imaging and reevaluation.       ____________________________________________   FINAL CLINICAL IMPRESSION(S) / ED DIAGNOSES   Final diagnoses:  Fall  Fall      MEDICATIONS GIVEN DURING THIS VISIT:  Medications  cefTRIAXone (ROCEPHIN) 1 g in sodium chloride 0.9 % 100 mL IVPB (has no administration in time range)  fentaNYL (SUBLIMAZE) injection 50 mcg (50 mcg Intravenous Given 08/28/20 2251)  ondansetron (  ZOFRAN) injection 4 mg (4 mg Intravenous Given 08/28/20 2251)  iohexol (OMNIPAQUE) 350 MG/ML  injection 100 mL (100 mLs Intravenous Contrast Given 08/28/20 2338)     ED Discharge Orders    None       Note:  This document was prepared using Dragon voice recognition software and may include unintentional dictation errors.   Vanessa [place], MD 08/29/20 [phone]

## 2020-08-28 NOTE — ED Notes (Signed)
Pt observed in resting stat with eyes closed after Fentanyl administration -- O2 sats dropped to 85% --improved to 96% after suppl O2 via [place] @2L  -- pt easily aroused; otherwise no distress.

## 2020-08-29 ENCOUNTER — Encounter: Payer: Self-pay | Admitting: Internal Medicine

## 2020-08-29 DIAGNOSIS — J9601 Acute respiratory failure with hypoxia: Secondary | ICD-10-CM | POA: Diagnosis present

## 2020-08-29 DIAGNOSIS — N390 Urinary tract infection, site not specified: Secondary | ICD-10-CM | POA: Diagnosis present

## 2020-08-29 DIAGNOSIS — S2249XA Multiple fractures of ribs, unspecified side, initial encounter for closed fracture: Secondary | ICD-10-CM | POA: Diagnosis present

## 2020-08-29 DIAGNOSIS — J189 Pneumonia, unspecified organism: Secondary | ICD-10-CM

## 2020-08-29 DIAGNOSIS — N179 Acute kidney failure, unspecified: Secondary | ICD-10-CM

## 2020-08-29 DIAGNOSIS — Z9181 History of falling: Secondary | ICD-10-CM | POA: Diagnosis not present

## 2020-08-29 DIAGNOSIS — E039 Hypothyroidism, unspecified: Secondary | ICD-10-CM | POA: Diagnosis present

## 2020-08-29 DIAGNOSIS — I10 Essential (primary) hypertension: Secondary | ICD-10-CM | POA: Diagnosis present

## 2020-08-29 DIAGNOSIS — Z87440 Personal history of urinary (tract) infections: Secondary | ICD-10-CM | POA: Diagnosis not present

## 2020-08-29 DIAGNOSIS — M329 Systemic lupus erythematosus, unspecified: Secondary | ICD-10-CM | POA: Diagnosis present

## 2020-08-29 DIAGNOSIS — M1990 Unspecified osteoarthritis, unspecified site: Secondary | ICD-10-CM | POA: Diagnosis present

## 2020-08-29 DIAGNOSIS — W19XXXA Unspecified fall, initial encounter: Secondary | ICD-10-CM | POA: Diagnosis not present

## 2020-08-29 DIAGNOSIS — M549 Dorsalgia, unspecified: Secondary | ICD-10-CM | POA: Diagnosis present

## 2020-08-29 DIAGNOSIS — W06XXXA Fall from bed, initial encounter: Secondary | ICD-10-CM | POA: Diagnosis present

## 2020-08-29 DIAGNOSIS — Z87442 Personal history of urinary calculi: Secondary | ICD-10-CM | POA: Diagnosis not present

## 2020-08-29 DIAGNOSIS — Z6841 Body Mass Index (BMI) 40.0 and over, adult: Secondary | ICD-10-CM | POA: Diagnosis not present

## 2020-08-29 DIAGNOSIS — J449 Chronic obstructive pulmonary disease, unspecified: Secondary | ICD-10-CM

## 2020-08-29 DIAGNOSIS — Y92009 Unspecified place in unspecified non-institutional (private) residence as the place of occurrence of the external cause: Secondary | ICD-10-CM | POA: Diagnosis not present

## 2020-08-29 DIAGNOSIS — J440 Chronic obstructive pulmonary disease with acute lower respiratory infection: Secondary | ICD-10-CM | POA: Diagnosis present

## 2020-08-29 DIAGNOSIS — Z20822 Contact with and (suspected) exposure to covid-19: Secondary | ICD-10-CM | POA: Diagnosis present

## 2020-08-29 DIAGNOSIS — Z9884 Bariatric surgery status: Secondary | ICD-10-CM | POA: Diagnosis not present

## 2020-08-29 DIAGNOSIS — M6282 Rhabdomyolysis: Secondary | ICD-10-CM | POA: Diagnosis present

## 2020-08-29 DIAGNOSIS — J690 Pneumonitis due to inhalation of food and vomit: Secondary | ICD-10-CM | POA: Diagnosis present

## 2020-08-29 DIAGNOSIS — Z803 Family history of malignant neoplasm of breast: Secondary | ICD-10-CM | POA: Diagnosis not present

## 2020-08-29 LAB — URINE DRUG SCREEN, QUALITATIVE (ARMC ONLY)
Amphetamines, Ur Screen: NOT DETECTED
Barbiturates, Ur Screen: NOT DETECTED
Benzodiazepine, Ur Scrn: NOT DETECTED
Cannabinoid 50 Ng, Ur ~~LOC~~: NOT DETECTED
Cocaine Metabolite,Ur ~~LOC~~: NOT DETECTED
MDMA (Ecstasy)Ur Screen: NOT DETECTED
Methadone Scn, Ur: NOT DETECTED
Opiate, Ur Screen: NOT DETECTED
Phencyclidine (PCP) Ur S: NOT DETECTED
Tricyclic, Ur Screen: NOT DETECTED

## 2020-08-29 LAB — BLOOD GAS, VENOUS
Acid-base deficit: 1.9 mmol/L (ref 0.0–2.0)
Bicarbonate: 25.7 mmol/L (ref 20.0–28.0)
O2 Saturation: 66.5 %
Patient temperature: 37
pCO2, Ven: 56 mmHg (ref 44.0–60.0)
pH, Ven: 7.27 (ref 7.250–7.430)
pO2, Ven: 40 mmHg (ref 32.0–45.0)

## 2020-08-29 LAB — CBC
HCT: 41.9 % (ref 36.0–46.0)
Hemoglobin: 12.8 g/dL (ref 12.0–15.0)
MCH: 31.1 pg (ref 26.0–34.0)
MCHC: 30.5 g/dL (ref 30.0–36.0)
MCV: 101.7 fL — ABNORMAL HIGH (ref 80.0–100.0)
Platelets: 172 10*3/uL (ref 150–400)
RBC: 4.12 MIL/uL (ref 3.87–5.11)
RDW: 14.2 % (ref 11.5–15.5)
WBC: 8.7 10*3/uL (ref 4.0–10.5)
nRBC: 0 % (ref 0.0–0.2)

## 2020-08-29 LAB — HIV ANTIBODY (ROUTINE TESTING W REFLEX): HIV Screen 4th Generation wRfx: NONREACTIVE

## 2020-08-29 LAB — PROCALCITONIN: Procalcitonin: <0.1 ng/mL

## 2020-08-29 LAB — CREATININE, SERUM
Creatinine, Ser: 0.99 mg/dL (ref 0.44–1.00)
GFR, Estimated: >60 mL/min (ref >60)

## 2020-08-29 LAB — TROPONIN I (HIGH SENSITIVITY): Troponin I (High Sensitivity): 2 ng/L (ref <18)

## 2020-08-29 MED ORDER — SODIUM CHLORIDE 0.9 % IV SOLN: 500.0000 mg | Freq: Once | INTRAVENOUS | Status: DC

## 2020-08-29 MED ORDER — ENOXAPARIN SODIUM 60 MG/0.6ML ~~LOC~~ SOLN
0.5000 mg/kg | SUBCUTANEOUS | Status: DC
  Administered 2020-08-29 – 2020-08-30 (×2): 55 mg via SUBCUTANEOUS
  Filled 2020-08-29 (×2): qty 0.6

## 2020-08-29 MED ORDER — SODIUM CHLORIDE 0.9 % IV SOLN
500.0000 mg | INTRAVENOUS | Status: DC
  Administered 2020-08-29 – 2020-08-30 (×2): 500 mg via INTRAVENOUS
  Filled 2020-08-29 (×2): qty 500

## 2020-08-29 MED ORDER — CEFTRIAXONE SODIUM 2 G IJ SOLR
2.0000 g | INTRAMUSCULAR | Status: DC
  Administered 2020-08-29 – 2020-08-30 (×2): 2 g via INTRAVENOUS
  Filled 2020-08-29: qty 20
  Filled 2020-08-29: qty 2
  Filled 2020-08-29: qty 20

## 2020-08-29 MED ORDER — SODIUM CHLORIDE 0.9 % IV SOLN
1.0000 g | Freq: Once | INTRAVENOUS | Status: DC
  Filled 2020-08-29: qty 10

## 2020-08-29 MED ORDER — OXYCODONE HCL 5 MG PO TABS
5.0000 mg | ORAL_TABLET | ORAL | Status: DC | PRN
  Administered 2020-08-29 – 2020-08-30 (×2): 5 mg via ORAL
  Filled 2020-08-29 (×2): qty 1

## 2020-08-29 MED ORDER — LIDOCAINE 5 % EX PTCH
1.0000 | MEDICATED_PATCH | CUTANEOUS | Status: DC
  Administered 2020-08-29 – 2020-08-30 (×2): 1 via TRANSDERMAL
  Filled 2020-08-29 (×2): qty 1

## 2020-08-29 MED ORDER — SODIUM CHLORIDE 0.9 % IV SOLN: INTRAVENOUS | Status: AC

## 2020-08-29 MED ORDER — ACETAMINOPHEN 325 MG PO TABS
650.0000 mg | ORAL_TABLET | Freq: Four times a day (QID) | ORAL | Status: DC | PRN
  Administered 2020-08-29 (×3): 650 mg via ORAL
  Filled 2020-08-29 (×3): qty 2

## 2020-08-29 NOTE — H&P (Signed)
History and Physical    Ohanna Gassert CWC:[id number] DOB: 04-Nov-1950 DOA: 08/28/2020  PCP: System, Provider Not In   Patient coming from: Home  I have personally briefly reviewed patient's old medical records in [place]  Chief Complaint: Fall  HPI: Brantlee Hinde is a 70 y.o. female who lives alone and at baseline ambulates with a walker with medical history significant for  HTN, hypothyroidism, SLE on Plaquenil, COPD, class III obesity, history of falls with multiple rib fractures in January 2021 and who is currently on antibiotics by PCP for UTI who presents to the emergency room following a fall in which she fell off the bed onto the floor with reported loss of consciousness.  States she was sitting at the edge of the bed and must have fallen asleep.  Estimates she might have been out for about 30 minutes.  Patient lives alone and fall was unwitnessed.  She also reported a fall the day prior.  On presentation by EMS she complained of pain all over including back pain and prior abdominal pain from her UTI.  She denies feeling unwell prior to going to bed and except for abdominal pain from recent UTI denies nausea vomiting or change in bowel habit, chest pain, shortness of breath or palpitations, lightheadedness, visual disturbance, one-sided weakness numbness or tingling. ED course: On arrival awake, alert, BP 137/80, pulse 67, respirations 20, afebrile with O2 sat initially 94% on room air trending down to the mid 80s to 85, requiring placement of O2 via nasal cannula, improving to 96% on 2 L.  Blood work for the most part unremarkable but significant for creatinine of 1.07, up from baseline of 0.68 about 5 months prior and hemoglobin of 11.9, down from 14.7 five months prior.  Troponin negative x2.  CK2 48.  Urinalysis with positive nitrite.  COVID and flu negative EKG, personally viewed and interpreted: Normal sinus rhythm at 62 with no acute ST-T wave changes Imaging: Patient had  extensive trauma imaging, including CT head, C, T, L-spine, CTA chest, CT abdomen and pelvis without any evidence of acute trauma but with possible finding on CTA chest of groundglass opacities.  Patient was given fentanyl for pain control.  Started on Rocephin and azithromycin.  Hospitalist consulted for admission.  Review of Systems: As per HPI otherwise all other systems on review of systems negative.    Past Medical History:  Diagnosis Date  . Arthritis   . Collagen vascular disease ([place])   . COPD (chronic obstructive pulmonary disease) ([place])   . DDD (degenerative disc disease), lumbar 05/14/2016  . Depression   . Dizziness   . Dyspnea   . GERD (gastroesophageal reflux disease)   . Heart valve problem    per grandaughter need surgery but they told her may not survive;  [name] History of kidney stones   . Hypertension   . Hypothyroidism   . Lupus ([place])   . Neuromuscular disorder ([place])   . Sleep apnea    no CPAP  . Thyroid disease   . Vitamin D deficiency 05/14/2016    Past Surgical History:  Procedure Laterality Date  . APPENDECTOMY    . BREAST BIOPSY Left    neg  . CESAREAN SECTION    . COLONOSCOPY WITH PROPOFOL N/A 07/01/2017   Procedure: COLONOSCOPY WITH PROPOFOL;  Surgeon: Manya Silvas, MD;  Location[facility] ENDOSCOPY;  Service: Endoscopy;  Laterality: N/A;  . ESOPHAGOGASTRODUODENOSCOPY (EGD) WITH PROPOFOL N/A 07/01/2017   Procedure: ESOPHAGOGASTRODUODENOSCOPY (EGD) WITH  PROPOFOL;  Surgeon: Manya Silvas, MD;  Location: [facility] ENDOSCOPY;  Service: Endoscopy;  Laterality: N/A;  . GASTRIC BYPASS    . LITHOTRIPSY    . TUBAL LIGATION       reports that she has never smoked. She has never used smokeless tobacco. She reports that she does not drink alcohol and does not use drugs.  No Known Allergies  Family History  Problem Relation Age of Onset  . Breast cancer Maternal Aunt   . Breast cancer Cousin       Prior to Admission medications   Medication Sig Start  Date End Date Taking? Authorizing Provider  acetaminophen (TYLENOL) 325 MG tablet Take 650 mg by mouth every 6 (six) hours as needed.    [provider]  albuterol (VENTOLIN HFA) 108 (90 Base) MCG/ACT inhaler Inhale 2 puffs into the lungs every 6 (six) hours as needed for wheezing or shortness of breath. 06/09/19   Arta Silence, MD  alendronate (FOSAMAX) 70 MG tablet Take 70 mg by mouth once a week. Take with a full glass of water on an empty stomach.    [provider]  B Complex Vitamins (B COMPLEX PO) Take 1 tablet by mouth daily.     [provider]  busPIRone (BUSPAR) 10 MG tablet Take 10 mg by mouth daily.    [provider]  diclofenac Sodium (VOLTAREN) 1 % GEL Apply 2 g topically 4 (four) times daily.  05/19/19   [provider]  DULoxetine (CYMBALTA) 60 MG capsule Take 1 capsule (60 mg total) by mouth daily. Patient taking differently: Take 30 mg by mouth daily.  04/17/16   Pucilowska, Herma Ard B, MD  ferrous sulfate 325 (65 FE) MG tablet Take 325 mg by mouth daily.    [provider]  folic acid (FOLVITE) 1 MG tablet Take 1 mg by mouth daily.    [provider]  gabapentin (NEURONTIN) 800 MG tablet Take 800 mg by mouth 2 (two) times daily.    [provider]  hydroxychloroquine (PLAQUENIL) 200 MG tablet Take 2 tablets (400 mg total) by mouth daily. Patient taking differently: Take 200 mg by mouth 2 (two) times daily.  04/04/16   Theodoro Grist, MD  ibuprofen (ADVIL) 200 MG tablet Take 400-800 mg by mouth every 6 (six) hours as needed for fever, mild pain or moderate pain.    [provider]  ipratropium-albuterol (DUONEB) 0.5-2.5 (3) MG/3ML SOLN Take 3 mLs by nebulization every 6 (six) hours as needed (Wheezing). 01/30/19   Henreitta Leber, MD  isosorbide mononitrate (IMDUR) 30 MG 24 hr tablet Take 30 mg by mouth daily.    [provider]  levothyroxine (SYNTHROID, LEVOTHROID) 75 MCG tablet Take 75  mcg by mouth daily before breakfast.    [provider]  LORazepam (ATIVAN) 0.5 MG tablet Take 0.5 mg by mouth at bedtime.    [provider]  metoprolol succinate (TOPROL-XL) 25 MG 24 hr tablet Take 25 mg by mouth daily.     [provider]  OLANZapine (ZYPREXA) 15 MG tablet Take 1 tablet (15 mg total) by mouth at bedtime. 04/16/16   Pucilowska, Jolanta B, MD  ondansetron (ZOFRAN) 4 MG tablet Take 1 tablet (4 mg total) by mouth daily as needed for nausea or vomiting. Patient not taking: Reported on 03/23/2020 11/15/19   Harvest Dark, MD  pantoprazole (PROTONIX) 40 MG tablet Take 40 mg by mouth 2 (two) times daily.     [provider]  traZODone (DESYREL) 100 MG tablet Take 100 mg by mouth at bedtime.    [provider]    Physical Exam: Vitals:   08/28/20 2131 08/28/20 2255 08/28/20 2300 08/29/20 0037  BP:    125/74  Pulse:    63  Resp:    16  Temp:    97.7 F (36.5 C)  TempSrc:    Oral  SpO2:  (!) 85% 96% 96%  Weight: 110.7 kg     Height: 5\' 1"  (1.549 m)        Vitals:   08/28/20 2131 08/28/20 2255 08/28/20 2300 08/29/20 0037  BP:    125/74  Pulse:    63  Resp:    16  Temp:    97.7 F (36.5 C)  TempSrc:    Oral  SpO2:  (!) 85% 96% 96%  Weight: 110.7 kg     Height: 5\' 1"  (1.549 m)         Constitutional: Alert and oriented x 3 . Not in any apparent distress HEENT:      Head: Normocephalic and atraumatic.         Eyes: PERLA, EOMI, Conjunctivae are normal. Sclera is non-icteric.       Mouth/Throat: Mucous membranes are moist.       Neck: Supple with no signs of meningismus. Cardiovascular: Regular rate and rhythm. No murmurs, gallops, or rubs. 2+ symmetrical distal pulses are present . No JVD. No LE edema Respiratory: Respiratory effort normal .Lungs sounds clear bilaterally. No wheezes, crackles, or rhonchi.  Gastrointestinal: Soft, diffuse tenderness on palpation, and non distended with positive bowel sounds.   Genitourinary: No CVA tenderness. Musculoskeletal: Nontender with normal range of motion in all extremities. No cyanosis, or erythema of extremities.  Tender on palpation along arms and legs Neurologic:  Face is symmetric. Moving all extremities. No gross focal neurologic deficits . Skin: Skin is warm, dry.  No rash or ulcers Psychiatric: Mood and affect are normal    Labs on Admission: I have personally reviewed following labs and imaging studies  CBC: Recent Labs  Lab 08/28/20 2240  WBC 7.9  NEUTROABS 4.0  HGB 11.9*  HCT 39.0  MCV 101.3*  PLT [id number]   Basic Metabolic Panel: Recent Labs  Lab 08/28/20 2240  NA 138  K 4.9  CL 109  CO2 23  GLUCOSE 80  BUN 23  CREATININE 1.07*  CALCIUM 8.8*   GFR: Estimated Creatinine Clearance: 58 mL/min (A) (by C-G formula based on SCr of 1.07 mg/dL (H)). Liver Function Tests: Recent Labs  Lab 08/28/20 2240  AST 24  ALT 17  ALKPHOS 72  BILITOT 0.6  PROT 5.8*  ALBUMIN 3.3*   No results for input(s): LIPASE, AMYLASE in the last 168 hours. No results for input(s): AMMONIA in the last 168 hours. Coagulation Profile: No results for input(s): INR, PROTIME in the last 168 hours. Cardiac Enzymes: Recent Labs  Lab 08/28/20 2240  CKTOTAL 248*   BNP (last 3 results) No results for input(s): PROBNP in the last 8760 hours. HbA1C: No results for input(s): HGBA1C in the last 72 hours. CBG: No results for input(s): GLUCAP in the last 168 hours. Lipid Profile: No results for input(s): CHOL, HDL, LDLCALC, TRIG, CHOLHDL, LDLDIRECT in the last 72 hours. Thyroid Function Tests: No results for input(s): TSH, T4TOTAL, FREET4, T3FREE, THYROIDAB in the last 72 hours. Anemia Panel: No results for input(s): VITAMINB12, FOLATE, FERRITIN, TIBC, IRON, RETICCTPCT in the last 72 hours. Urine analysis:  Component Value Date/Time   COLORURINE AMBER (A) 08/28/2020 2240   APPEARANCEUR CLEAR (A) 08/28/2020 2240   LABSPEC 1.020 08/28/2020 2240    PHURINE 5.0 08/28/2020 2240   GLUCOSEU NEGATIVE 08/28/2020 2240   HGBUR NEGATIVE 08/28/2020 2240   BILIRUBINUR NEGATIVE 08/28/2020 2240   KETONESUR NEGATIVE 08/28/2020 2240   PROTEINUR 30 (A) 08/28/2020 2240   NITRITE POSITIVE (A) 08/28/2020 2240   LEUKOCYTESUR NEGATIVE 08/28/2020 2240    Radiological Exams on Admission: CT Head Wo Contrast  Result Date: 08/29/2020 CLINICAL DATA:  s/p unwitnessed fall after rolling out of bed- pt believes she had +LOC and now c/o back pain and R parietal region pain -- no obvious deformities, ecchymosis, lacerations. EXAM: CT HEAD WITHOUT CONTRAST CT CERVICAL WITHOUT CONTRAST CT CHEST, ABDOMEN, PELVIS WITH CONTRAST CT THORACIC, AND LUMBAR SPINE WITH CONTRAST TECHNIQUE: Contiguous axial images were obtained from the base of the skull through the vertex without intravenous contrast. Multidetector CT imaging of the cervical, lumbar, and thoracic spine was performed with intravenous contrast. Multiplanar CT image reconstructions were also generated. Multidetector CT imaging of the chest, abdomen and pelvis was performed following the standard protocol during bolus administration of intravenous contrast. CONTRAST:  156mL OMNIPAQUE IOHEXOL 350 MG/ML SOLN COMPARISON:  CT chest, abdomen, pelvis, thoracic and lumbar spine 05/21/2019 FINDINGS: CT HEAD: Brain: Patchy and confluent areas of decreased attenuation are noted throughout the deep and periventricular white matter of the cerebral hemispheres bilaterally, compatible with chronic microvascular ischemic disease. No evidence of large-territorial acute infarction. No parenchymal hemorrhage. No mass lesion. No extra-axial collection. No mass effect or midline shift. No hydrocephalus. Basilar cisterns are patent. Vascular: No hyperdense vessel. Atherosclerotic calcifications are present within the cavernous internal carotid arteries. Skull: No acute fracture or focal lesion. Sinuses/Orbits: Paranasal sinuses and mastoid air  cells are clear. The orbits are unremarkable. Other: None. CT CERVICAL SPINE FINDINGS Alignment: Normal. Skull base and vertebrae: Limited evaluation of the mid to lower cervical spine due to quantum mottle artifact. Multilevel mild degenerative changes of the spine. No acute fracture. No aggressive appearing focal osseous lesion or focal pathologic process. Soft tissues and spinal canal: No prevertebral fluid or swelling. No visible canal hematoma. Upper chest: Unremarkable. Other: None. CT CHEST FINDINGS Ports and Devices: None. Lungs/airways: Expiratory phase of respiration. Low lung volumes. Bilateral lower lobe and upper lobe atelectasis with question of superimposed ground-glass airspace opacities. Couple scattered pulmonary micronodules. No pulmonary mass. No pulmonary contusion or laceration. No pneumatocele formation. The central airways are patent. Pleura: No pleural effusion. No pneumothorax. No hemothorax. Lymph Nodes: No mediastinal, hilar, or axillary lymphadenopathy. Mediastinum: Satisfactory preferential opacification of the pulmonary artery. No vascular cutoff or filling defect noted to suggest pulmonary embolus. No pneumomediastinum. No aortic injury or mediastinal hematoma. The thoracic aorta is normal in caliber. Mild atherosclerotic plaque of the thoracic aorta. The heart is normal in size. No significant pericardial effusion. Likely mild 2 vessel coronary artery calcifications. The esophagus is unremarkable.  At least small volume hiatal hernia. The thyroid is unremarkable. Chest Wall / Breasts: No chest wall mass. Musculoskeletal: Chronic nondisplaced healed sternal fracture (12:83). No acute displaced rib or acute displaced sternal fracture. Old healed right rib fractures. Left shoulder calcific tendinitis (6:1). CT ABDOMEN AND PELVIS FINDINGS Liver: Not enlarged. No focal lesion. No laceration or subcapsular hematoma. Biliary System: The gallbladder is otherwise unremarkable with no  radio-opaque gallstones. No biliary ductal dilatation. Pancreas: Normal pancreatic contour. No main pancreatic duct dilatation. Spleen: Not enlarged. No focal lesion.  No laceration, subcapsular hematoma, or vascular injury. Adrenal Glands: No nodularity bilaterally. Kidneys: Bilateral kidneys enhance symmetrically. Bilateral renal cortical scarring, left greater than right. No hydronephrosis. No contusion, laceration, or subcapsular hematoma. No injury to the vascular structures or collecting systems. No hydroureter. The urinary bladder is unremarkable. On delayed imaging, there is no urothelial wall thickening and there are no filling defects in the opacified portions of the bilateral collecting systems or ureters. 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 No small or large bowel wall thickening or dilatation. Stool throughout the colon. The appendix not definitely identified. Mesentery, Omentum, and Peritoneum: No simple free fluid ascites. No pneumoperitoneum. No hemoperitoneum. No mesenteric hematoma identified. No organized fluid col. Lection. Pelvic Organs: Similar-appearing atrophic uterus. Bilateral adnexal regions are unremarkable. Lymph Nodes: No abdominal, pelvic, inguinal lymphadenopathy. Vasculature: No abdominal aorta or iliac aneurysm. No active contrast extravasation or pseudoaneurysm. Musculoskeletal: No significant soft tissue hematoma. No acute pelvic fracture. Densely sclerotic lesion within the left iliac bone. CT THORACIC SPINE FINDINGS Alignment: Normal. Vertebrae: No acute fracture or focal pathologic process. Redemonstration of a sclerotic lesion within the T3 vertebral body. No suspicious lytic or blastic osseous lesions. Mild multilevel degenerative changes of the spine. Similar-appearing Schmorl node of the superior endplate of the T6 vertebral body. No severe osseous neural foraminal or central canal stenosis. Paraspinal and other soft tissues: Negative. Disc levels:  Maintained. CT LUMBAR SPINE FINDINGS Segmentation: 5 lumbar type vertebrae. Alignment: Normal. Vertebrae: No acute fracture or focal pathologic process. No suspicious lytic or blastic osseous lesions. Mild multilevel degenerative changes of the spine. No severe osseous neural foraminal or central canal stenosis. Paraspinal and other soft tissues: Negative. Disc levels: Maintained. IMPRESSION: 1. No acute intracranial abnormality. 2. No acute displaced fracture or traumatic listhesis of the cervical spine. 3. No pulmonary embolus. 4. Low lung volumes with suggestion of superimposed infection/inflammation. 5. No acute traumatic injury to the chest, abdomen, or pelvis. 6. No acute fracture or traumatic malalignment of the thoracic or lumbar spine. 7. 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 Electronically Signed   By: Iven Finn M.D.   On: 08/29/2020 00:38   CT Angio Chest PE W and/or Wo Contrast  Result Date: 08/29/2020 CLINICAL DATA:  s/p unwitnessed fall after rolling out of bed- pt believes she had +LOC and now c/o back pain and R parietal region pain -- no obvious deformities, ecchymosis, lacerations. EXAM: CT HEAD WITHOUT CONTRAST CT CERVICAL WITHOUT CONTRAST CT CHEST, ABDOMEN, PELVIS WITH CONTRAST CT THORACIC, AND LUMBAR SPINE WITH CONTRAST TECHNIQUE: Contiguous axial images were obtained from the base of the skull through the vertex without intravenous contrast. Multidetector CT imaging of the cervical, lumbar, and thoracic spine was performed with intravenous contrast. Multiplanar CT image reconstructions were also generated. Multidetector CT imaging of the chest, abdomen and pelvis was performed following the standard protocol during bolus administration of intravenous contrast. CONTRAST:  160mL OMNIPAQUE IOHEXOL 350 MG/ML SOLN  COMPARISON:  CT chest, abdomen, pelvis, thoracic and lumbar spine 05/21/2019 FINDINGS: CT HEAD: Brain: Patchy and confluent areas of decreased attenuation are noted throughout the deep and periventricular white matter of the cerebral hemispheres bilaterally, compatible with chronic microvascular ischemic disease. No evidence of large-territorial acute infarction. No parenchymal hemorrhage. No mass lesion. No extra-axial collection. No mass effect or midline shift. No hydrocephalus. Basilar cisterns are patent. Vascular: No  hyperdense vessel. Atherosclerotic calcifications are present within the cavernous internal carotid arteries. Skull: No acute fracture or focal lesion. Sinuses/Orbits: Paranasal sinuses and mastoid air cells are clear. The orbits are unremarkable. Other: None. CT CERVICAL SPINE FINDINGS Alignment: Normal. Skull base and vertebrae: Limited evaluation of the mid to lower cervical spine due to quantum mottle artifact. Multilevel mild degenerative changes of the spine. No acute fracture. No aggressive appearing focal osseous lesion or focal pathologic process. Soft tissues and spinal canal: No prevertebral fluid or swelling. No visible canal hematoma. Upper chest: Unremarkable. Other: None. CT CHEST FINDINGS Ports and Devices: None. Lungs/airways: Expiratory phase of respiration. Low lung volumes. Bilateral lower lobe and upper lobe atelectasis with question of superimposed ground-glass airspace opacities. Couple scattered pulmonary micronodules. No pulmonary mass. No pulmonary contusion or laceration. No pneumatocele formation. The central airways are patent. Pleura: No pleural effusion. No pneumothorax. No hemothorax. Lymph Nodes: No mediastinal, hilar, or axillary lymphadenopathy. Mediastinum: Satisfactory preferential opacification of the pulmonary artery. No vascular cutoff or filling defect noted to suggest pulmonary embolus. No pneumomediastinum. No aortic injury or mediastinal hematoma. The  thoracic aorta is normal in caliber. Mild atherosclerotic plaque of the thoracic aorta. The heart is normal in size. No significant pericardial effusion. Likely mild 2 vessel coronary artery calcifications. The esophagus is unremarkable.  At least small volume hiatal hernia. The thyroid is unremarkable. Chest Wall / Breasts: No chest wall mass. Musculoskeletal: Chronic nondisplaced healed sternal fracture (12:83). No acute displaced rib or acute displaced sternal fracture. Old healed right rib fractures. Left shoulder calcific tendinitis (6:1). CT ABDOMEN AND PELVIS FINDINGS Liver: Not enlarged. No focal lesion. No laceration or subcapsular hematoma. Biliary System: The gallbladder is otherwise unremarkable with no radio-opaque gallstones. No biliary ductal dilatation. Pancreas: Normal pancreatic contour. No main pancreatic duct dilatation. Spleen: Not enlarged. No focal lesion. No laceration, subcapsular hematoma, or vascular injury. Adrenal Glands: No nodularity bilaterally. Kidneys: Bilateral kidneys enhance symmetrically. Bilateral renal cortical scarring, left greater than right. No hydronephrosis. No contusion, laceration, or subcapsular hematoma. No injury to the vascular structures or collecting systems. No hydroureter. The urinary bladder is unremarkable. On delayed imaging, there is no urothelial wall thickening and there are no filling defects in the opacified portions of the bilateral collecting systems or ureters. 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 No small or large bowel wall thickening or dilatation. Stool throughout the colon. The appendix not definitely identified. Mesentery, Omentum, and Peritoneum: No simple free fluid ascites. No pneumoperitoneum. No hemoperitoneum. No mesenteric hematoma identified. No organized fluid col. Lection. Pelvic Organs: Similar-appearing atrophic uterus. Bilateral adnexal regions are unremarkable. Lymph Nodes: No abdominal, pelvic, inguinal  lymphadenopathy. Vasculature: No abdominal aorta or iliac aneurysm. No active contrast extravasation or pseudoaneurysm. Musculoskeletal: No significant soft tissue hematoma. No acute pelvic fracture. Densely sclerotic lesion within the left iliac bone. CT THORACIC SPINE FINDINGS Alignment: Normal. Vertebrae: No acute fracture or focal pathologic process. Redemonstration of a sclerotic lesion within the T3 vertebral body. No suspicious lytic or blastic osseous lesions. Mild multilevel degenerative changes of the spine. Similar-appearing Schmorl node of the superior endplate of the T6 vertebral body. No severe osseous neural foraminal or central canal stenosis. Paraspinal and other soft tissues: Negative. Disc levels: Maintained. CT LUMBAR SPINE FINDINGS Segmentation: 5 lumbar type vertebrae. Alignment: Normal. Vertebrae: No acute fracture or focal pathologic process. No suspicious lytic or blastic osseous lesions. Mild multilevel degenerative changes of the spine. No severe osseous neural foraminal or central canal stenosis. Paraspinal and  other soft tissues: Negative. Disc levels: Maintained. IMPRESSION: 1. No acute intracranial abnormality. 2. No acute displaced fracture or traumatic listhesis of the cervical spine. 3. No pulmonary embolus. 4. Low lung volumes with suggestion of superimposed infection/inflammation. 5. No acute traumatic injury to the chest, abdomen, or pelvis. 6. No acute fracture or traumatic malalignment of the thoracic or lumbar spine. 7. 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 Electronically Signed   By: Iven Finn M.D.   On: 08/29/2020 00:38   CT Cervical Spine Wo Contrast  Result Date: 08/29/2020 CLINICAL DATA:  s/p unwitnessed fall after rolling out of bed- pt believes she had +LOC and now c/o back pain and R  parietal region pain -- no obvious deformities, ecchymosis, lacerations. EXAM: CT HEAD WITHOUT CONTRAST CT CERVICAL WITHOUT CONTRAST CT CHEST, ABDOMEN, PELVIS WITH CONTRAST CT THORACIC, AND LUMBAR SPINE WITH CONTRAST TECHNIQUE: Contiguous axial images were obtained from the base of the skull through the vertex without intravenous contrast. Multidetector CT imaging of the cervical, lumbar, and thoracic spine was performed with intravenous contrast. Multiplanar CT image reconstructions were also generated. Multidetector CT imaging of the chest, abdomen and pelvis was performed following the standard protocol during bolus administration of intravenous contrast. CONTRAST:  130mL OMNIPAQUE IOHEXOL 350 MG/ML SOLN COMPARISON:  CT chest, abdomen, pelvis, thoracic and lumbar spine 05/21/2019 FINDINGS: CT HEAD: Brain: Patchy and confluent areas of decreased attenuation are noted throughout the deep and periventricular white matter of the cerebral hemispheres bilaterally, compatible with chronic microvascular ischemic disease. No evidence of large-territorial acute infarction. No parenchymal hemorrhage. No mass lesion. No extra-axial collection. No mass effect or midline shift. No hydrocephalus. Basilar cisterns are patent. Vascular: No hyperdense vessel. Atherosclerotic calcifications are present within the cavernous internal carotid arteries. Skull: No acute fracture or focal lesion. Sinuses/Orbits: Paranasal sinuses and mastoid air cells are clear. The orbits are unremarkable. Other: None. CT CERVICAL SPINE FINDINGS Alignment: Normal. Skull base and vertebrae: Limited evaluation of the mid to lower cervical spine due to quantum mottle artifact. Multilevel mild degenerative changes of the spine. No acute fracture. No aggressive appearing focal osseous lesion or focal pathologic process. Soft tissues and spinal canal: No prevertebral fluid or swelling. No visible canal hematoma. Upper chest: Unremarkable. Other: None. CT  CHEST FINDINGS Ports and Devices: None. Lungs/airways: Expiratory phase of respiration. Low lung volumes. Bilateral lower lobe and upper lobe atelectasis with question of superimposed ground-glass airspace opacities. Couple scattered pulmonary micronodules. No pulmonary mass. No pulmonary contusion or laceration. No pneumatocele formation. The central airways are patent. Pleura: No pleural effusion. No pneumothorax. No hemothorax. Lymph Nodes: No mediastinal, hilar, or axillary lymphadenopathy. Mediastinum: Satisfactory preferential opacification of the pulmonary artery. No vascular cutoff or filling defect noted to suggest pulmonary embolus. No pneumomediastinum. No aortic injury or mediastinal hematoma. The thoracic aorta is normal in caliber. Mild atherosclerotic plaque of the thoracic aorta. The heart is normal in size. No significant pericardial effusion. Likely mild 2 vessel coronary artery calcifications. The esophagus is unremarkable.  At least small volume hiatal hernia. The thyroid is unremarkable. Chest Wall / Breasts: No chest wall mass. Musculoskeletal: Chronic nondisplaced healed sternal fracture (12:83). No acute displaced rib or acute displaced sternal fracture. Old healed right rib fractures. Left shoulder calcific tendinitis (6:1). CT ABDOMEN AND PELVIS FINDINGS Liver: Not enlarged. No focal lesion. No laceration or subcapsular hematoma. Biliary  System: The gallbladder is otherwise unremarkable with no radio-opaque gallstones. No biliary ductal dilatation. Pancreas: Normal pancreatic contour. No main pancreatic duct dilatation. Spleen: Not enlarged. No focal lesion. No laceration, subcapsular hematoma, or vascular injury. Adrenal Glands: No nodularity bilaterally. Kidneys: Bilateral kidneys enhance symmetrically. Bilateral renal cortical scarring, left greater than right. No hydronephrosis. No contusion, laceration, or subcapsular hematoma. No injury to the vascular structures or collecting  systems. No hydroureter. The urinary bladder is unremarkable. On delayed imaging, there is no urothelial wall thickening and there are no filling defects in the opacified portions of the bilateral collecting systems or ureters. 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 No small or large bowel wall thickening or dilatation. Stool throughout the colon. The appendix not definitely identified. Mesentery, Omentum, and Peritoneum: No simple free fluid ascites. No pneumoperitoneum. No hemoperitoneum. No mesenteric hematoma identified. No organized fluid col. Lection. Pelvic Organs: Similar-appearing atrophic uterus. Bilateral adnexal regions are unremarkable. Lymph Nodes: No abdominal, pelvic, inguinal lymphadenopathy. Vasculature: No abdominal aorta or iliac aneurysm. No active contrast extravasation or pseudoaneurysm. Musculoskeletal: No significant soft tissue hematoma. No acute pelvic fracture. Densely sclerotic lesion within the left iliac bone. CT THORACIC SPINE FINDINGS Alignment: Normal. Vertebrae: No acute fracture or focal pathologic process. Redemonstration of a sclerotic lesion within the T3 vertebral body. No suspicious lytic or blastic osseous lesions. Mild multilevel degenerative changes of the spine. Similar-appearing Schmorl node of the superior endplate of the T6 vertebral body. No severe osseous neural foraminal or central canal stenosis. Paraspinal and other soft tissues: Negative. Disc levels: Maintained. CT LUMBAR SPINE FINDINGS Segmentation: 5 lumbar type vertebrae. Alignment: Normal. Vertebrae: No acute fracture or focal pathologic process. No suspicious lytic or blastic osseous lesions. Mild multilevel degenerative changes of the spine. No severe osseous neural foraminal or central canal stenosis. Paraspinal and other soft tissues: Negative. Disc levels: Maintained. IMPRESSION: 1. No acute intracranial abnormality. 2. No acute displaced fracture or traumatic listhesis of the  cervical spine. 3. No pulmonary embolus. 4. Low lung volumes with suggestion of superimposed infection/inflammation. 5. No acute traumatic injury to the chest, abdomen, or pelvis. 6. No acute fracture or traumatic malalignment of the thoracic or lumbar spine. 7. 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 Electronically Signed   By: Iven Finn M.D.   On: 08/29/2020 00:38   CT ABDOMEN PELVIS W CONTRAST  Result Date: 08/29/2020 CLINICAL DATA:  s/p unwitnessed fall after rolling out of bed- pt believes she had +LOC and now c/o back pain and R parietal region pain -- no obvious deformities, ecchymosis, lacerations. EXAM: CT HEAD WITHOUT CONTRAST CT CERVICAL WITHOUT CONTRAST CT CHEST, ABDOMEN, PELVIS WITH CONTRAST CT THORACIC, AND LUMBAR SPINE WITH CONTRAST TECHNIQUE: Contiguous axial images were obtained from the base of the skull through the vertex without intravenous contrast. Multidetector CT imaging of the cervical, lumbar, and thoracic spine was performed with intravenous contrast. Multiplanar CT image reconstructions were also generated. Multidetector CT imaging of the chest, abdomen and pelvis was performed following the standard protocol during bolus administration of intravenous contrast. CONTRAST:  178mL OMNIPAQUE IOHEXOL 350 MG/ML SOLN COMPARISON:  CT chest, abdomen, pelvis, thoracic and lumbar spine 05/21/2019 FINDINGS: CT HEAD: Brain: Patchy and confluent areas of decreased attenuation are noted throughout the deep and periventricular white matter of the cerebral hemispheres bilaterally, compatible with chronic microvascular ischemic disease. No evidence of  large-territorial acute infarction. No parenchymal hemorrhage. No mass lesion. No extra-axial collection. No mass effect or midline shift. No hydrocephalus. Basilar cisterns are  patent. Vascular: No hyperdense vessel. Atherosclerotic calcifications are present within the cavernous internal carotid arteries. Skull: No acute fracture or focal lesion. Sinuses/Orbits: Paranasal sinuses and mastoid air cells are clear. The orbits are unremarkable. Other: None. CT CERVICAL SPINE FINDINGS Alignment: Normal. Skull base and vertebrae: Limited evaluation of the mid to lower cervical spine due to quantum mottle artifact. Multilevel mild degenerative changes of the spine. No acute fracture. No aggressive appearing focal osseous lesion or focal pathologic process. Soft tissues and spinal canal: No prevertebral fluid or swelling. No visible canal hematoma. Upper chest: Unremarkable. Other: None. CT CHEST FINDINGS Ports and Devices: None. Lungs/airways: Expiratory phase of respiration. Low lung volumes. Bilateral lower lobe and upper lobe atelectasis with question of superimposed ground-glass airspace opacities. Couple scattered pulmonary micronodules. No pulmonary mass. No pulmonary contusion or laceration. No pneumatocele formation. The central airways are patent. Pleura: No pleural effusion. No pneumothorax. No hemothorax. Lymph Nodes: No mediastinal, hilar, or axillary lymphadenopathy. Mediastinum: Satisfactory preferential opacification of the pulmonary artery. No vascular cutoff or filling defect noted to suggest pulmonary embolus. No pneumomediastinum. No aortic injury or mediastinal hematoma. The thoracic aorta is normal in caliber. Mild atherosclerotic plaque of the thoracic aorta. The heart is normal in size. No significant pericardial effusion. Likely mild 2 vessel coronary artery calcifications. The esophagus is unremarkable.  At least small volume hiatal hernia. The thyroid is unremarkable. Chest Wall / Breasts: No chest wall mass. Musculoskeletal: Chronic nondisplaced healed sternal fracture (12:83). No acute displaced rib or acute displaced sternal fracture. Old healed right rib fractures.  Left shoulder calcific tendinitis (6:1). CT ABDOMEN AND PELVIS FINDINGS Liver: Not enlarged. No focal lesion. No laceration or subcapsular hematoma. Biliary System: The gallbladder is otherwise unremarkable with no radio-opaque gallstones. No biliary ductal dilatation. Pancreas: Normal pancreatic contour. No main pancreatic duct dilatation. Spleen: Not enlarged. No focal lesion. No laceration, subcapsular hematoma, or vascular injury. Adrenal Glands: No nodularity bilaterally. Kidneys: Bilateral kidneys enhance symmetrically. Bilateral renal cortical scarring, left greater than right. No hydronephrosis. No contusion, laceration, or subcapsular hematoma. No injury to the vascular structures or collecting systems. No hydroureter. The urinary bladder is unremarkable. On delayed imaging, there is no urothelial wall thickening and there are no filling defects in the opacified portions of the bilateral collecting systems or ureters. 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 No small or large bowel wall thickening or dilatation. Stool throughout the colon. The appendix not definitely identified. Mesentery, Omentum, and Peritoneum: No simple free fluid ascites. No pneumoperitoneum. No hemoperitoneum. No mesenteric hematoma identified. No organized fluid col. Lection. Pelvic Organs: Similar-appearing atrophic uterus. Bilateral adnexal regions are unremarkable. Lymph Nodes: No abdominal, pelvic, inguinal lymphadenopathy. Vasculature: No abdominal aorta or iliac aneurysm. No active contrast extravasation or pseudoaneurysm. Musculoskeletal: No significant soft tissue hematoma. No acute pelvic fracture. Densely sclerotic lesion within the left iliac bone. CT THORACIC SPINE FINDINGS Alignment: Normal. Vertebrae: No acute fracture or focal pathologic process. Redemonstration of a sclerotic lesion within the T3 vertebral body. No suspicious lytic or blastic osseous lesions. Mild multilevel degenerative changes of  the spine. Similar-appearing Schmorl node of the superior endplate of the T6 vertebral body. No severe osseous neural foraminal or central canal stenosis. Paraspinal and other soft tissues: Negative. Disc levels: Maintained. CT LUMBAR SPINE FINDINGS Segmentation: 5 lumbar type vertebrae. Alignment: Normal. Vertebrae: No acute fracture or focal  pathologic process. No suspicious lytic or blastic osseous lesions. Mild multilevel degenerative changes of the spine. No severe osseous neural foraminal or central canal stenosis. Paraspinal and other soft tissues: Negative. Disc levels: Maintained. IMPRESSION: 1. No acute intracranial abnormality. 2. No acute displaced fracture or traumatic listhesis of the cervical spine. 3. No pulmonary embolus. 4. Low lung volumes with suggestion of superimposed infection/inflammation. 5. No acute traumatic injury to the chest, abdomen, or pelvis. 6. No acute fracture or traumatic malalignment of the thoracic or lumbar spine. 7. 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 Electronically Signed   By: Iven Finn M.D.   On: 08/29/2020 00:38   CT T-SPINE NO CHARGE  Result Date: 08/29/2020 CLINICAL DATA:  s/p unwitnessed fall after rolling out of bed- pt believes she had +LOC and now c/o back pain and R parietal region pain -- no obvious deformities, ecchymosis, lacerations. EXAM: CT HEAD WITHOUT CONTRAST CT CERVICAL WITHOUT CONTRAST CT CHEST, ABDOMEN, PELVIS WITH CONTRAST CT THORACIC, AND LUMBAR SPINE WITH CONTRAST TECHNIQUE: Contiguous axial images were obtained from the base of the skull through the vertex without intravenous contrast. Multidetector CT imaging of the cervical, lumbar, and thoracic spine was performed with intravenous contrast. Multiplanar CT image reconstructions were also generated. Multidetector CT  imaging of the chest, abdomen and pelvis was performed following the standard protocol during bolus administration of intravenous contrast. CONTRAST:  124mL OMNIPAQUE IOHEXOL 350 MG/ML SOLN COMPARISON:  CT chest, abdomen, pelvis, thoracic and lumbar spine 05/21/2019 FINDINGS: CT HEAD: Brain: Patchy and confluent areas of decreased attenuation are noted throughout the deep and periventricular white matter of the cerebral hemispheres bilaterally, compatible with chronic microvascular ischemic disease. No evidence of large-territorial acute infarction. No parenchymal hemorrhage. No mass lesion. No extra-axial collection. No mass effect or midline shift. No hydrocephalus. Basilar cisterns are patent. Vascular: No hyperdense vessel. Atherosclerotic calcifications are present within the cavernous internal carotid arteries. Skull: No acute fracture or focal lesion. Sinuses/Orbits: Paranasal sinuses and mastoid air cells are clear. The orbits are unremarkable. Other: None. CT CERVICAL SPINE FINDINGS Alignment: Normal. Skull base and vertebrae: Limited evaluation of the mid to lower cervical spine due to quantum mottle artifact. Multilevel mild degenerative changes of the spine. No acute fracture. No aggressive appearing focal osseous lesion or focal pathologic process. Soft tissues and spinal canal: No prevertebral fluid or swelling. No visible canal hematoma. Upper chest: Unremarkable. Other: None. CT CHEST FINDINGS Ports and Devices: None. Lungs/airways: Expiratory phase of respiration. Low lung volumes. Bilateral lower lobe and upper lobe atelectasis with question of superimposed ground-glass airspace opacities. Couple scattered pulmonary micronodules. No pulmonary mass. No pulmonary contusion or laceration. No pneumatocele formation. The central airways are patent. Pleura: No pleural effusion. No pneumothorax. No hemothorax. Lymph Nodes: No mediastinal, hilar, or axillary lymphadenopathy. Mediastinum: Satisfactory  preferential opacification of the pulmonary artery. No vascular cutoff or filling defect noted to suggest pulmonary embolus. No pneumomediastinum. No aortic injury or mediastinal hematoma. The thoracic aorta is normal in caliber. Mild atherosclerotic plaque of the thoracic aorta. The heart is normal in size. No significant pericardial effusion. Likely mild 2 vessel coronary artery calcifications. The esophagus is unremarkable.  At least small volume hiatal hernia. The thyroid is unremarkable. Chest Wall / Breasts: No chest wall mass. Musculoskeletal: Chronic nondisplaced healed sternal fracture (12:83). No acute displaced rib or acute displaced sternal fracture. Old  healed right rib fractures. Left shoulder calcific tendinitis (6:1). CT ABDOMEN AND PELVIS FINDINGS Liver: Not enlarged. No focal lesion. No laceration or subcapsular hematoma. Biliary System: The gallbladder is otherwise unremarkable with no radio-opaque gallstones. No biliary ductal dilatation. Pancreas: Normal pancreatic contour. No main pancreatic duct dilatation. Spleen: Not enlarged. No focal lesion. No laceration, subcapsular hematoma, or vascular injury. Adrenal Glands: No nodularity bilaterally. Kidneys: Bilateral kidneys enhance symmetrically. Bilateral renal cortical scarring, left greater than right. No hydronephrosis. No contusion, laceration, or subcapsular hematoma. No injury to the vascular structures or collecting systems. No hydroureter. The urinary bladder is unremarkable. On delayed imaging, there is no urothelial wall thickening and there are no filling defects in the opacified portions of the bilateral collecting systems or ureters. 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 No small or large bowel wall thickening or dilatation. Stool throughout the colon. The appendix not definitely identified. Mesentery, Omentum, and Peritoneum: No simple free fluid ascites. No pneumoperitoneum. No hemoperitoneum. No mesenteric  hematoma identified. No organized fluid col. Lection. Pelvic Organs: Similar-appearing atrophic uterus. Bilateral adnexal regions are unremarkable. Lymph Nodes: No abdominal, pelvic, inguinal lymphadenopathy. Vasculature: No abdominal aorta or iliac aneurysm. No active contrast extravasation or pseudoaneurysm. Musculoskeletal: No significant soft tissue hematoma. No acute pelvic fracture. Densely sclerotic lesion within the left iliac bone. CT THORACIC SPINE FINDINGS Alignment: Normal. Vertebrae: No acute fracture or focal pathologic process. Redemonstration of a sclerotic lesion within the T3 vertebral body. No suspicious lytic or blastic osseous lesions. Mild multilevel degenerative changes of the spine. Similar-appearing Schmorl node of the superior endplate of the T6 vertebral body. No severe osseous neural foraminal or central canal stenosis. Paraspinal and other soft tissues: Negative. Disc levels: Maintained. CT LUMBAR SPINE FINDINGS Segmentation: 5 lumbar type vertebrae. Alignment: Normal. Vertebrae: No acute fracture or focal pathologic process. No suspicious lytic or blastic osseous lesions. Mild multilevel degenerative changes of the spine. No severe osseous neural foraminal or central canal stenosis. Paraspinal and other soft tissues: Negative. Disc levels: Maintained. IMPRESSION: 1. No acute intracranial abnormality. 2. No acute displaced fracture or traumatic listhesis of the cervical spine. 3. No pulmonary embolus. 4. Low lung volumes with suggestion of superimposed infection/inflammation. 5. No acute traumatic injury to the chest, abdomen, or pelvis. 6. No acute fracture or traumatic malalignment of the thoracic or lumbar spine. 7. 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 Electronically Signed   By: Iven Finn M.D.   On:  08/29/2020 00:38   CT L-SPINE NO CHARGE  Result Date: 08/29/2020 CLINICAL DATA:  s/p unwitnessed fall after rolling out of bed- pt believes she had +LOC and now c/o back pain and R parietal region pain -- no obvious deformities, ecchymosis, lacerations. EXAM: CT HEAD WITHOUT CONTRAST CT CERVICAL WITHOUT CONTRAST CT CHEST, ABDOMEN, PELVIS WITH CONTRAST CT THORACIC, AND LUMBAR SPINE WITH CONTRAST TECHNIQUE: Contiguous axial images were obtained from the base of the skull through the vertex without intravenous contrast. Multidetector CT imaging of the cervical, lumbar, and thoracic spine was performed with intravenous contrast. Multiplanar CT image reconstructions were also generated. Multidetector CT imaging of the chest, abdomen and pelvis was performed following the standard protocol during bolus administration of intravenous contrast. CONTRAST:  162mL OMNIPAQUE IOHEXOL 350 MG/ML SOLN COMPARISON:  CT chest, abdomen, pelvis, thoracic and lumbar spine 05/21/2019 FINDINGS: CT HEAD: Brain: Patchy and confluent areas of  decreased attenuation are noted throughout the deep and periventricular white matter of the cerebral hemispheres bilaterally, compatible with chronic microvascular ischemic disease. No evidence of large-territorial acute infarction. No parenchymal hemorrhage. No mass lesion. No extra-axial collection. No mass effect or midline shift. No hydrocephalus. Basilar cisterns are patent. Vascular: No hyperdense vessel. Atherosclerotic calcifications are present within the cavernous internal carotid arteries. Skull: No acute fracture or focal lesion. Sinuses/Orbits: Paranasal sinuses and mastoid air cells are clear. The orbits are unremarkable. Other: None. CT CERVICAL SPINE FINDINGS Alignment: Normal. Skull base and vertebrae: Limited evaluation of the mid to lower cervical spine due to quantum mottle artifact. Multilevel mild degenerative changes of the spine. No acute fracture. No aggressive appearing focal  osseous lesion or focal pathologic process. Soft tissues and spinal canal: No prevertebral fluid or swelling. No visible canal hematoma. Upper chest: Unremarkable. Other: None. CT CHEST FINDINGS Ports and Devices: None. Lungs/airways: Expiratory phase of respiration. Low lung volumes. Bilateral lower lobe and upper lobe atelectasis with question of superimposed ground-glass airspace opacities. Couple scattered pulmonary micronodules. No pulmonary mass. No pulmonary contusion or laceration. No pneumatocele formation. The central airways are patent. Pleura: No pleural effusion. No pneumothorax. No hemothorax. Lymph Nodes: No mediastinal, hilar, or axillary lymphadenopathy. Mediastinum: Satisfactory preferential opacification of the pulmonary artery. No vascular cutoff or filling defect noted to suggest pulmonary embolus. No pneumomediastinum. No aortic injury or mediastinal hematoma. The thoracic aorta is normal in caliber. Mild atherosclerotic plaque of the thoracic aorta. The heart is normal in size. No significant pericardial effusion. Likely mild 2 vessel coronary artery calcifications. The esophagus is unremarkable.  At least small volume hiatal hernia. The thyroid is unremarkable. Chest Wall / Breasts: No chest wall mass. Musculoskeletal: Chronic nondisplaced healed sternal fracture (12:83). No acute displaced rib or acute displaced sternal fracture. Old healed right rib fractures. Left shoulder calcific tendinitis (6:1). CT ABDOMEN AND PELVIS FINDINGS Liver: Not enlarged. No focal lesion. No laceration or subcapsular hematoma. Biliary System: The gallbladder is otherwise unremarkable with no radio-opaque gallstones. No biliary ductal dilatation. Pancreas: Normal pancreatic contour. No main pancreatic duct dilatation. Spleen: Not enlarged. No focal lesion. No laceration, subcapsular hematoma, or vascular injury. Adrenal Glands: No nodularity bilaterally. Kidneys: Bilateral kidneys enhance symmetrically.  Bilateral renal cortical scarring, left greater than right. No hydronephrosis. No contusion, laceration, or subcapsular hematoma. No injury to the vascular structures or collecting systems. No hydroureter. The urinary bladder is unremarkable. On delayed imaging, there is no urothelial wall thickening and there are no filling defects in the opacified portions of the bilateral collecting systems or ureters. 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 No small or large bowel wall thickening or dilatation. Stool throughout the colon. The appendix not definitely identified. Mesentery, Omentum, and Peritoneum: No simple free fluid ascites. No pneumoperitoneum. No hemoperitoneum. No mesenteric hematoma identified. No organized fluid col. Lection. Pelvic Organs: Similar-appearing atrophic uterus. Bilateral adnexal regions are unremarkable. Lymph Nodes: No abdominal, pelvic, inguinal lymphadenopathy. Vasculature: No abdominal aorta or iliac aneurysm. No active contrast extravasation or pseudoaneurysm. Musculoskeletal: No significant soft tissue hematoma. No acute pelvic fracture. Densely sclerotic lesion within the left iliac bone. CT THORACIC SPINE FINDINGS Alignment: Normal. Vertebrae: No acute fracture or focal pathologic process. Redemonstration of a sclerotic lesion within the T3 vertebral body. No suspicious lytic or blastic osseous lesions. Mild multilevel degenerative changes of the spine. Similar-appearing Schmorl node of the superior endplate of the T6 vertebral body. No severe osseous neural foraminal or central canal stenosis. Paraspinal and  other soft tissues: Negative. Disc levels: Maintained. CT LUMBAR SPINE FINDINGS Segmentation: 5 lumbar type vertebrae. Alignment: Normal. Vertebrae: No acute fracture or focal pathologic process. No suspicious lytic or blastic osseous lesions. Mild multilevel degenerative changes of the spine. No severe osseous neural foraminal or central canal stenosis.  Paraspinal and other soft tissues: Negative. Disc levels: Maintained. IMPRESSION: 1. No acute intracranial abnormality. 2. No acute displaced fracture or traumatic listhesis of the cervical spine. 3. No pulmonary embolus. 4. Low lung volumes with suggestion of superimposed infection/inflammation. 5. No acute traumatic injury to the chest, abdomen, or pelvis. 6. No acute fracture or traumatic malalignment of the thoracic or lumbar spine. 7. 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 Electronically Signed   By: Iven Finn M.D.   On: 08/29/2020 00:38   DG Chest Portable 1 View  Result Date: 08/28/2020 CLINICAL DATA:  Shortness of breath. Unwitnessed fall after rolling out of bed. EXAM: PORTABLE CHEST 1 VIEW COMPARISON:  Chest x-ray 06/09/2019, CT chest 05/21/2019 FINDINGS: The heart size and mediastinal contours are unchanged. No focal consolidation. Bilateral lower lung zone linear atelectasis versus scarring. Similar appearing coarsened interstitial markings with no overt pulmonary edema. No pleural effusion. No pneumothorax. No acute osseous abnormality.  Old healed right rib fractures. IMPRESSION: Similar appearing coarsened interstitial markings with no definite focal consolidation or acute cardiopulmonary abnormality. Electronically Signed   By: Iven Finn M.D.   On: 08/28/2020 22:18     Assessment/Plan 70 year old female who ilves alone and at baseline ambulates with a walker with history of HTN, hypothyroidism, SLE on Plaquenil, COPD, class III obesity, history of falls with injury in the past and who is currently on antibiotics for UTI presenting following a fall off the bed with LOC.  Hypoxic in ER to 85% on RA    Acute respiratory failure with hypoxia (HCC) Bilateral pneumonia, possible aspiration - Patient became hypoxic  in the ER with O2 sat 85% on room air requiring 2 L to maintain sats in the mid 90s - CTA chest showed bilateral groundglass airspace opacities - COVID and flu negative - Possible aspiration given patient report of LOC - Continue Rocephin and azithromycin - Supplemental oxygen to keep sats over 94% - Follow procalcitonin to evaluate for LR TI  Fall with LOC - Patient reports LOC x30 minutes after fall from sitting the edge of the bed where she thinks she might have fallen asleep -  mild dehydration in combination with medication, acute infection may be contributors to fall - We will get VBG and UDS given hypoxia - IV hydration - Treat pneumonia/UTI  Mild rhabdomyolysis from unwitnessed fall History of fall with rib fractures in January 2021 - Patient rolled out of bed onto floor, possible LOC - Extensive trauma work-up with head to pelvis CTs with no evidence of acute injury - CK mildly elevated - IV hydration - At baseline patient ambulates with cane - PT and TOC consult  UTI (urinary tract infection) -Urinalysis with positive nitrites -Rocephin as above.  Patient was on outpatient antibiotics x3 days  AKI (acute kidney injury) ([place]) - Creatinine 1.07 up from baseline of 0.68 - IV hydration     Hypothyroidism - Continue levothyroxine    Essential hypertension - Continue metoprolol    Lupus (HCC) - Continue Plaquenil    Obesity, Class III, BMI 40-49.9 (morbid obesity) ([place]) -  Complicating factor to overall prognosis and care    COPD (chronic obstructive pulmonary disease) ([place]) - Not acutely exacerbated - DuoNebs as needed   Fall at home, initial encounter    DVT prophylaxis: Lovenox  Code Status: full code  Family Communication:  none  Disposition Plan: Back to previous home environment Consults called: none  Status: Observation    Athena Masse MD [facility]     08/29/2020, 2:29 AM

## 2020-08-29 NOTE — ED Notes (Signed)
Tylenol ordered for pt HA- see MAR for administration.

## 2020-08-29 NOTE — Progress Notes (Signed)
PHARMACIST - PHYSICIAN COMMUNICATION  CONCERNING:  Enoxaparin (Lovenox) for DVT Prophylaxis    RECOMMENDATION: Patient was prescribed enoxaprin 40mg  q24 hours for VTE prophylaxis.   Filed Weights   08/28/20 2131  Weight: 110.7 kg (244 lb)    Body mass index is 46.1 kg/m.  Estimated Creatinine Clearance: 58 mL/min (A) (by C-G formula based on SCr of 1.07 mg/dL (H)).   Based on [place] patient is candidate for enoxaparin 0.5mg /kg TBW SQ every 24 hours based on BMI being >30.  DESCRIPTION: Pharmacy has adjusted enoxaparin dose per [facility] policy.  Patient is now receiving enoxaparin 0.5 mg/kg every 24 hours   Renda Rolls, PharmD, [facility] 08/29/2020 2:27 AM

## 2020-08-29 NOTE — Progress Notes (Addendum)
Brief hospitalist update note.  This is a nonbillable note.  Please see same-day H&P for full billable details.  Briefly, this is a 70 year old Spanish-speaking female who lives alone and at baseline ambulates with a walker.  Medical history significant for hypertension, hypothyroidism, SLE, COPD, obesity, history of falls with multiple rib fractures.  Patient is not a good historian so majority the history is gained by reading the medical record.  Apparently the patient had an unwitnessed fall at home and associated loss of consciousness.  Unclear but estimates she may have been down for 30 minutes.  Extensive imaging survey negative for acute fracture.  There is a question of possible aspiration and patient has urinalysis significant for nitrites.  Being treated for presumed community-acquired pneumonia and/or urinary tract infection.  Will continue antibiotics for now.  Therapy evaluations requested.  On IV fluids.  Pending verification of home medications and will restart as appropriate.  Called patient's granddaughter Fredricka Bonine [phone].    Ralene Muskrat MD

## 2020-08-29 NOTE — ED Notes (Signed)
Pt primarily speaks Spanish - translator via remote tablet used to communicate d/t language barrier-- Pt reports back and head pain; Fentanyl to be administered.  RR even and unlabored on RA with symmetrical rise and fall of chest.  Cardiac monitoring ongiong - NSR HR 60s.  Abdomen obese but soft and nontender.  Skin warm dry and intact.  Purewick now in place with orange colored urine noted in cannister -- pt recently diagnosed outpt with UTI and started on oral meds for UTI tx.

## 2020-08-29 NOTE — ED Provider Notes (Signed)
  Physical Exam  BP 137/80 (BP Location: Left Arm)   Pulse 67   Temp 97.9 F (36.6 C) (Oral)   Resp 20   Ht 5\' 1"  (1.549 m)   Wt 110.7 kg   SpO2 96%   BMI 46.10 kg/m   Physical Exam  ED Course/Procedures     Procedures  MDM  12:00 AM  Assumed care.  Patient here with 2 mechanical falls.  Complaining of diffuse pain and also shortness of breath.  Does have a urinary tract infection.  Will give Rocephin and add on urine culture.  Has been previously bacteremic from E. coli before.  Will obtain blood cultures.  CTs pending.   1:50 AM  Pt briefly hypoxic here now on oxygen to 85% and doing well on 2 L nasal cannula.  Does not wear oxygen at home.  CT scan showed no traumatic injury but does appear that she has low lung volumes with superimposed infection/inflammation.  COVID and flu negative.  We will add on azithromycin.  Will discuss with medicine for admission.  She has been updated using the Spanish interpreter.   2:15 AM Discussed patient's case with hospitalist, Dr. Damita Dunnings.  I have recommended admission and patient (and family if present) agree with this plan. Admitting physician will place admission orders.   I reviewed all nursing notes, vitals, pertinent previous records and reviewed/interpreted all EKGs, lab and urine results, imaging (as available).     Shelly Freeman, Delice Bison, DO 08/29/20 [phone]

## 2020-08-29 NOTE — ED Notes (Signed)
Pt now resting comfortably -- pleasant affect- has asked what time it is and for a snack -- pt agreeable to applesauce and crackers

## 2020-08-29 NOTE — ED Notes (Signed)
Pt wearing purewick however purewick dislodged and brief and fitted sheet now urine soaked --pericare performed and new purewick and fresh linen now on bed.  Well tolerated by pt.  Pt does report HA at this time; will notify on call provider

## 2020-08-30 LAB — URINE CULTURE: Culture: <10000 — AB

## 2020-08-30 NOTE — Evaluation (Signed)
Physical Therapy Evaluation Patient Details Name: Shelly Freeman MRN: [id number] DOB: [birth year] Today's Date: 08/30/2020   History of Present Illness  Pt is a 70 yo female PMH of hypertension, hypothyroidism, SLE, COPD, obesity, history of falls with multiple rib fractures, lupus, gastruc bypass,    who is currently on antibiotics for UTI presenting following a fall off the bed with LOC. Per chart, presumptive etiology of patient's fall out of bed was a large dose of Zyprexa that was started by outpatient provider.    Clinical Impression  Pt alert, interpretor via language line ipad utilized throughout session. Pt reported low back pain 6/10, pain patch noted in place. The patient stated she lives alone at baseline, and uses her rollator all of the time. Participant in PACE program, granddaughter assists PRN as needed.  The patient demonstrated bed mobility modI. Good sitting balance. MMT of LE revealed only mild deficits in bilateral hip flexion strength, 4/5. Sit <> stand from EOB twice as well as from [facility] over standard commode, progressed to supervision with RW. Pt ambulated ~2ft total with RW and CGA. She did ambulate around the bed to recliner without walker but with constant bilateral UE support. Overall the patient demonstrated mild deficits in endurance, mobility, and balance and would benefit from further skilled PT intervention to maximize safety, independence, and mobility. Recommendation is HHPT with intermittent supervision.    Follow Up Recommendations Home health PT;Supervision - Intermittent    Equipment Recommendations  None recommended by PT    Recommendations for Other Services       Precautions / Restrictions Precautions Precautions: Fall Restrictions Weight Bearing Restrictions: No      Mobility  Bed Mobility Overal bed mobility: Modified Independent                  Transfers Overall transfer level: Needs assistance Equipment used: Rolling walker  (2 wheeled) Transfers: Sit to/from Stand Sit to Stand: Min guard;Supervision         General transfer comment: pt did leave RW outside BOS occasionally for transfers, but reaches for bilateral support constantly  Ambulation/Gait Ambulation/Gait assistance: Min guard;Supervision Gait Distance (Feet): 25 Feet Assistive device: Rolling walker (2 wheeled)       General Gait Details: no LOB noted, pt denied dizziness  Stairs            Wheelchair Mobility    Modified Rankin (Stroke Patients Only)       Balance Overall balance assessment: Needs assistance Sitting-balance support: Feet supported Sitting balance-Leahy Scale: Good       Standing balance-Leahy Scale: Good Standing balance comment: pt more comfortable with bilateral UE support for all dynamic tasks and ambulation                             Pertinent Vitals/Pain Pain Assessment: 0-10 Pain Score: 6  Pain Location: low back pain Pain Descriptors / Indicators: Aching Pain Intervention(s): Monitored during session;Repositioned    Home Living Family/patient expects to be discharged to:: Private residence Living Arrangements: Alone Available Help at Discharge: Family;Available PRN/intermittently (granddaughter. pt is a pace participant) Type of Home: Apartment Home Access: Level entry     Home Layout: One level Home Equipment: [place] - 2 wheels;Walker - 4 wheels      Prior Function Level of Independence: Needs assistance   Gait / Transfers Assistance Needed: ambulates with rollator  ADL's / Homemaking Assistance Needed: independent in dressing/bathing  Comments: 2 falls  prior to admission. her groceries are ordered and dropped off     Hand Dominance        Extremity/Trunk Assessment   Upper Extremity Assessment Upper Extremity Assessment: Defer to OT evaluation    Lower Extremity Assessment Lower Extremity Assessment: Overall WFL for tasks assessed (mild bilateral hip  flexion strength deficit at 4/5)    Cervical / Trunk Assessment Cervical / Trunk Assessment: Normal  Communication   Communication: Interpreter utilized (language line via ipad)  Cognition Arousal/Alertness: Awake/alert Behavior During Therapy: WFL for tasks assessed/performed Overall Cognitive Status: Within Functional Limits for tasks assessed                                        General Comments      Exercises Other Exercises Other Exercises: Pt able to use BSC over standard commode with supervision   Assessment/Plan    PT Assessment Patient needs continued PT services  PT Problem List Decreased activity tolerance;Decreased mobility       PT Treatment Interventions DME instruction;Balance training;Gait training;Neuromuscular re-education;Functional mobility training;Therapeutic activities;Therapeutic exercise;Patient/family education    PT Goals (Current goals can be found in the Care Plan section)  Acute Rehab PT Goals Patient Stated Goal: to go home PT Goal Formulation: With patient Time For Goal Achievement: 09/13/20 Potential to Achieve Goals: Good    Frequency Min 2X/week   Barriers to discharge        Co-evaluation PT/OT/SLP Co-Evaluation/Treatment: Yes Reason for Co-Treatment: Necessary to address cognition/behavior during functional activity;For patient/therapist safety;To address functional/ADL transfers PT goals addressed during session: Mobility/safety with mobility;Balance;Proper use of DME OT goals addressed during session: ADL's and self-care       AM-PAC PT "6 Clicks" Mobility  Outcome Measure Help needed turning from your back to your side while in a flat bed without using bedrails?: None Help needed moving from lying on your back to sitting on the side of a flat bed without using bedrails?: None Help needed moving to and from a bed to a chair (including a wheelchair)?: None Help needed standing up from a chair using your  arms (e.g., wheelchair or bedside chair)?: None Help needed to walk in hospital room?: A Little Help needed climbing 3-5 steps with a railing? : A Little 6 Click Score: 22    End of Session Equipment Utilized During Treatment: Gait belt Activity Tolerance: Patient tolerated treatment well Patient left: with call bell/phone within reach;in bed;with chair alarm set Nurse Communication: Mobility status PT Visit Diagnosis: Other abnormalities of gait and mobility (R26.89);Muscle weakness (generalized) (M62.81);Difficulty in walking, not elsewhere classified (R26.2);Pain Pain - Right/Left:  (midline) Pain - part of body:  (low back)    Time: 9604-5409 PT Time Calculation (min) (ACUTE ONLY): 17 min   Charges:   PT Evaluation $PT Eval Low Complexity: 1 Low PT Treatments $Therapeutic Exercise: 8-22 mins       Lieutenant Diego PT, DPT 11:07 AM,08/30/20

## 2020-08-30 NOTE — Evaluation (Signed)
Occupational Therapy Evaluation Patient Details Name: Shelly Freeman MRN: [id number] DOB: October 06, 1950 Today's Date: 08/30/2020    History of Present Illness Pt is a 70 yo female PMH of hypertension, hypothyroidism, SLE, COPD, obesity, history of falls with multiple rib fractures, lupus, gastruc bypass,    who is currently on antibiotics for UTI presenting following a fall off the bed with LOC. Per chart, presumptive etiology of patient's fall out of bed was a large dose of Zyprexa that was started by outpatient provider.   Clinical Impression   Ms Capps was seen for OT/PT co-evaluation this date, interpreter services utilized via iPad [facility] ID# [phone]). Prior to hospital admission, pt was MOD I for mobility and ADLs using 4WW. Pt lives alone with granddaughter available PRN and participates in PACE 2x/week. Pt presents to acute OT demonstrating near baseline independence to perform ADL and mobility tasks. Pt required SUPERVISION toileting at elevated commode - feet ~3 inches off of floor requiring pt to stand for perihygieine. SUPERVISION hand hygiene standing sinkside. No skilled acute OT needs identified. Will sign off. Please re-consult if additional OT needs arise.     Follow Up Recommendations  No OT follow up    Equipment Recommendations  None recommended by OT    Recommendations for Other Services       Precautions / Restrictions Precautions Precautions: Fall Restrictions Weight Bearing Restrictions: No      Mobility Bed Mobility Overal bed mobility: Modified Independent                  Transfers Overall transfer level: Needs assistance Equipment used: Rolling walker (2 wheeled) Transfers: Sit to/from Stand Sit to Stand: Supervision         General transfer comment: pt did leave RW outside BOS occasionally for transfers, but reaches for bilateral support constantly    Balance Overall balance assessment: Needs assistance Sitting-balance support:  Feet supported Sitting balance-Leahy Scale: Good     Standing balance support: Single extremity supported;During functional activity Standing balance-Leahy Scale: Good Standing balance comment: pt more comfortable with bilateral UE support for all dynamic tasks and ambulation                           ADL either performed or assessed with clinical judgement   ADL Overall ADL's : Needs assistance/impaired                                       General ADL Comments: SUPERVISION toileting at elevated commode - feet ~3 inches off of floor requiring pt to stand for perihygieine. SUPERVISION hand hygiene standing sinkside.                  Pertinent Vitals/Pain Pain Assessment: 0-10 Pain Score: 6  Pain Location: low back pain Pain Descriptors / Indicators: Aching Pain Intervention(s): Monitored during session;Repositioned     Hand Dominance Right   Extremity/Trunk Assessment Upper Extremity Assessment Upper Extremity Assessment: Overall WFL for tasks assessed   Lower Extremity Assessment Lower Extremity Assessment: Overall WFL for tasks assessed   Cervical / Trunk Assessment Cervical / Trunk Assessment: Normal   Communication Communication Communication: Interpreter utilized [facility])   Cognition Arousal/Alertness: Awake/alert Behavior During Therapy: WFL for tasks assessed/performed Overall Cognitive Status: Within Functional Limits for tasks assessed  General Comments       Exercises Exercises: Other exercises Other Exercises Other Exercises: Pt educated re: OT role, DME recs, d/c recs, falls prevention, ECS Other Exercises: Toileting, hand washing, sup>sit, sit<>stand, sitting/standing balance/tolerance   Shoulder Instructions      Home Living Family/patient expects to be discharged to:: Private residence Living Arrangements: Alone Available Help at Discharge:  Family;Available PRN/intermittently Type of Home: Apartment Home Access: Level entry     Home Layout: One level               Home Equipment: Walker - 2 wheels;Walker - 4 wheels          Prior Functioning/Environment Level of Independence: Needs assistance  Gait / Transfers Assistance Needed: ambulates with rollator ADL's / Homemaking Assistance Needed: independent in dressing/bathing   Comments: 2 falls prior to admission. her groceries are ordered and dropped off        OT Problem List: Decreased activity tolerance;Impaired balance (sitting and/or standing)         OT Goals(Current goals can be found in the care plan section) Acute Rehab OT Goals Patient Stated Goal: to go home OT Goal Formulation: With patient Time For Goal Achievement: 09/13/20 Potential to Achieve Goals: Good             Co-evaluation PT/OT/SLP Co-Evaluation/Treatment: Yes Reason for Co-Treatment: To address functional/ADL transfers;Necessary to address cognition/behavior during functional activity PT goals addressed during session: Mobility/safety with mobility;Proper use of DME OT goals addressed during session: ADL's and self-care      AM-PAC OT "6 Clicks" Daily Activity     Outcome Measure Help from another person eating meals?: None Help from another person taking care of personal grooming?: None Help from another person toileting, which includes using toliet, bedpan, or urinal?: A Little Help from another person bathing (including washing, rinsing, drying)?: A Little Help from another person to put on and taking off regular upper body clothing?: None Help from another person to put on and taking off regular lower body clothing?: None 6 Click Score: 22   End of Session Equipment Utilized During Treatment: Rolling walker Nurse Communication: Mobility status  Activity Tolerance: Patient tolerated treatment well Patient left: in chair;with call bell/phone within reach;with chair  alarm set  OT Visit Diagnosis: Other abnormalities of gait and mobility (R26.89)                Time: 1033-1050 OT Time Calculation (min): 17 min Charges:  OT General Charges $OT Visit: 1 Visit OT Evaluation $OT Eval Low Complexity: 1 Low   Dessie Coma, M.S. OTR/L  08/30/20, 1:02 PM  ascom [phone]

## 2020-08-30 NOTE — Discharge Summary (Signed)
Physician Discharge Summary  Saryna Stremel [id number] DOB: 1951/05/03 DOA: 08/28/2020  PCP: System, Provider Not In  Admit date: 08/28/2020 Discharge date: 08/30/2020  Admitted From: Home Disposition:  Home ([facility])  Recommendations for Outpatient Follow-up:  1. Follow up with PCP in5-7 days 2.   Home Health:No Equipment/Devices:None Discharge Condition:Stable CODE STATUS:Full Diet recommendation: Heart Healthy Brief/Interim Summary:  70 year old Spanish-speaking female who lives alone and at baseline ambulates with a walker.  Medical history significant for hypertension, hypothyroidism, SLE, COPD, obesity, history of falls with multiple rib fractures.  Patient is not a good historian so majority the history is gained by reading the medical record.  Apparently the patient had an unwitnessed fall at home and associated loss of consciousness.  Unclear but estimates she may have been down for 30 minutes.  Extensive imaging survey negative for acute fracture.  There is a question of possible aspiration and patient has urinalysis significant for nitrites.  Being treated for presumed community-acquired pneumonia and/or urinary tract infection.  On 4/26 I spoke with the patient's primary care physician at the [facility] program.  She indicates the patient was being treated outpatient for recurrent urinary tract infections.  Patient has likely chronic bacteriuria.  Urinalysis demonstrated evidence of nitrites however follow-up urine culture demonstrated insignificant growth, suspect colonization/asymptomatic bacteria.  Not indicative of true infection.  Also procalcitonin negative and patient presentation inconsistent with pneumonia.  Antibiotics stopped.  Patient stable for discharge at this time.  Per patient's primary care provider at [facility] they will have patient see physical therapy occupational therapy as outpatient.  Presumptive etiology of patient's fall out of bed was a large  dose of Zyprexa that was started by outpatient provider.  Patient's PCP will evaluate home medication regimen and make appropriate changes.  No changes made at home medication regimen at this time.  Patient's PCP will speak with patient's granddaughter.  Patient medically stable for discharge at this time.   Discharge Diagnoses:  Active Problems:   Acute respiratory failure with hypoxia (HCC)   Hypothyroidism   Essential hypertension   Lupus (HCC)   Obesity, Class III, BMI 40-49.9 (morbid obesity) ([place])   UTI (urinary tract infection)   Bilateral pneumonia   AKI (acute kidney injury) ([place])   COPD (chronic obstructive pulmonary disease) ([place])   Fall at home, initial encounter   CAP (community acquired pneumonia) Acute respiratory failure with hypoxia ([place]), resolved Bilateral pneumonia, possible aspiration, ruled out - Patient became hypoxic in the ER with O2 sat 85% on room air requiring 2 L to maintain sats in the mid 90s - CTA chest showed bilateral groundglass airspace opacities --Not representative of true pneumonia --COVID and flu negative --Suspect atelectasis versus aspiration pneumonitis --Procalcitonin negative --Patient afebrile on room air at time of discharge --Normal white blood cell count --Antibiotics for CAP stopped at time of discharge  Fall with LOC - Patient reports LOC x30 minutes after fall from sitting the edge of the bed where she thinks she might have fallen asleep -  mild dehydration in combination with medication,  -- Possibly attributed to high-dose Zyprexa prescribe outpatient provider  Mild rhabdomyolysis from unwitnessed fall History of fall with rib fractures in January 2021 - Patient rolled out of bed onto floor, possible LOC - Extensive trauma work-up with head to pelvis CTs with no evidence of acute injury - CK mildly elevated, improved at time of discharge  UTI (urinary tract infection) -Urinalysis with positive nitrites -Urine culture  with insignificant growth. --Suspect chronic  colonization/asymptomatic bacteria  AKI (acute kidney injury) ([place]) - Creatinine 1.07 up from baseline of 0.68 - IV hydration --Improved at time of DC     Hypothyroidism - Continue levothyroxine    Essential hypertension - Continue metoprolol    Lupus (HCC) - Continue Plaquenil    Obesity, Class III, BMI 40-49.9 (morbid obesity) ([place]) - Complicating factor to overall prognosis and care    COPD (chronic obstructive pulmonary disease) ([place]) - Not acutely exacerbated -    Discharge Instructions  Discharge Instructions    Diet - low sodium heart healthy   Complete by: As directed    Increase activity slowly   Complete by: As directed      Allergies as of 08/30/2020   No Known Allergies     Medication List    TAKE these medications   acetaminophen 325 MG tablet Commonly known as: TYLENOL Take 650 mg by mouth every 6 (six) hours as needed.   albuterol 108 (90 Base) MCG/ACT inhaler Commonly known as: VENTOLIN HFA Inhale 2 puffs into the lungs every 6 (six) hours as needed for wheezing or shortness of breath.   alendronate 70 MG tablet Commonly known as: FOSAMAX Take 70 mg by mouth once a week. Take with a full glass of water on an empty stomach.   B COMPLEX PO Take 1 tablet by mouth daily.   busPIRone 10 MG tablet Commonly known as: BUSPAR Take 10 mg by mouth daily.   diclofenac Sodium 1 % Gel Commonly known as: VOLTAREN Apply 2 g topically 4 (four) times daily.   DULoxetine 60 MG capsule Commonly known as: CYMBALTA Take 1 capsule (60 mg total) by mouth daily. What changed: how much to take   ferrous sulfate 325 (65 FE) MG tablet Take 325 mg by mouth daily.   folic acid 1 MG tablet Commonly known as: FOLVITE Take 1 mg by mouth daily.   gabapentin 800 MG tablet Commonly known as: NEURONTIN Take 800 mg by mouth 2 (two) times daily.   hydroxychloroquine 200 MG tablet Commonly known as:  Plaquenil Take 2 tablets (400 mg total) by mouth daily. What changed:   how much to take  when to take this   ibuprofen 200 MG tablet Commonly known as: ADVIL Take 400-800 mg by mouth every 6 (six) hours as needed for fever, mild pain or moderate pain.   ipratropium-albuterol 0.5-2.5 (3) MG/3ML Soln Commonly known as: DUONEB Take 3 mLs by nebulization every 6 (six) hours as needed (Wheezing).   isosorbide mononitrate 30 MG 24 hr tablet Commonly known as: IMDUR Take 30 mg by mouth daily.   levothyroxine 75 MCG tablet Commonly known as: SYNTHROID Take 75 mcg by mouth daily before breakfast.   LORazepam 0.5 MG tablet Commonly known as: ATIVAN Take 0.5 mg by mouth at bedtime.   metoprolol succinate 25 MG 24 hr tablet Commonly known as: TOPROL-XL Take 25 mg by mouth daily.   OLANZapine 15 MG tablet Commonly known as: ZYPREXA Take 1 tablet (15 mg total) by mouth at bedtime.   ondansetron 4 MG tablet Commonly known as: Zofran Take 1 tablet (4 mg total) by mouth daily as needed for nausea or vomiting.   pantoprazole 40 MG tablet Commonly known as: PROTONIX Take 40 mg by mouth 2 (two) times daily.   traZODone 100 MG tablet Commonly known as: DESYREL Take 100 mg by mouth at bedtime.       No Known Allergies  Consultations:  None   Procedures/Studies: CT  Head Wo Contrast  Result Date: 08/29/2020 CLINICAL DATA:  s/p unwitnessed fall after rolling out of bed- pt believes she had +LOC and now c/o back pain and R parietal region pain -- no obvious deformities, ecchymosis, lacerations. EXAM: CT HEAD WITHOUT CONTRAST CT CERVICAL WITHOUT CONTRAST CT CHEST, ABDOMEN, PELVIS WITH CONTRAST CT THORACIC, AND LUMBAR SPINE WITH CONTRAST TECHNIQUE: Contiguous axial images were obtained from the base of the skull through the vertex without intravenous contrast. Multidetector CT imaging of the cervical, lumbar, and thoracic spine was performed with intravenous contrast. Multiplanar CT  image reconstructions were also generated. Multidetector CT imaging of the chest, abdomen and pelvis was performed following the standard protocol during bolus administration of intravenous contrast. CONTRAST:  110mL OMNIPAQUE IOHEXOL 350 MG/ML SOLN COMPARISON:  CT chest, abdomen, pelvis, thoracic and lumbar spine 05/21/2019 FINDINGS: CT HEAD: Brain: Patchy and confluent areas of decreased attenuation are noted throughout the deep and periventricular white matter of the cerebral hemispheres bilaterally, compatible with chronic microvascular ischemic disease. No evidence of large-territorial acute infarction. No parenchymal hemorrhage. No mass lesion. No extra-axial collection. No mass effect or midline shift. No hydrocephalus. Basilar cisterns are patent. Vascular: No hyperdense vessel. Atherosclerotic calcifications are present within the cavernous internal carotid arteries. Skull: No acute fracture or focal lesion. Sinuses/Orbits: Paranasal sinuses and mastoid air cells are clear. The orbits are unremarkable. Other: None. CT CERVICAL SPINE FINDINGS Alignment: Normal. Skull base and vertebrae: Limited evaluation of the mid to lower cervical spine due to quantum mottle artifact. Multilevel mild degenerative changes of the spine. No acute fracture. No aggressive appearing focal osseous lesion or focal pathologic process. Soft tissues and spinal canal: No prevertebral fluid or swelling. No visible canal hematoma. Upper chest: Unremarkable. Other: None. CT CHEST FINDINGS Ports and Devices: None. Lungs/airways: Expiratory phase of respiration. Low lung volumes. Bilateral lower lobe and upper lobe atelectasis with question of superimposed ground-glass airspace opacities. Couple scattered pulmonary micronodules. No pulmonary mass. No pulmonary contusion or laceration. No pneumatocele formation. The central airways are patent. Pleura: No pleural effusion. No pneumothorax. No hemothorax. Lymph Nodes: No mediastinal, hilar,  or axillary lymphadenopathy. Mediastinum: Satisfactory preferential opacification of the pulmonary artery. No vascular cutoff or filling defect noted to suggest pulmonary embolus. No pneumomediastinum. No aortic injury or mediastinal hematoma. The thoracic aorta is normal in caliber. Mild atherosclerotic plaque of the thoracic aorta. The heart is normal in size. No significant pericardial effusion. Likely mild 2 vessel coronary artery calcifications. The esophagus is unremarkable.  At least small volume hiatal hernia. The thyroid is unremarkable. Chest Wall / Breasts: No chest wall mass. Musculoskeletal: Chronic nondisplaced healed sternal fracture (12:83). No acute displaced rib or acute displaced sternal fracture. Old healed right rib fractures. Left shoulder calcific tendinitis (6:1). CT ABDOMEN AND PELVIS FINDINGS Liver: Not enlarged. No focal lesion. No laceration or subcapsular hematoma. Biliary System: The gallbladder is otherwise unremarkable with no radio-opaque gallstones. No biliary ductal dilatation. Pancreas: Normal pancreatic contour. No main pancreatic duct dilatation. Spleen: Not enlarged. No focal lesion. No laceration, subcapsular hematoma, or vascular injury. Adrenal Glands: No nodularity bilaterally. Kidneys: Bilateral kidneys enhance symmetrically. Bilateral renal cortical scarring, left greater than right. No hydronephrosis. No contusion, laceration, or subcapsular hematoma. No injury to the vascular structures or collecting systems. No hydroureter. The urinary bladder is unremarkable. On delayed imaging, there is no urothelial wall thickening and there are no filling defects in the opacified portions of the bilateral collecting systems or ureters. 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 No small or large bowel wall thickening or dilatation. Stool throughout the colon. The appendix not definitely identified. Mesentery, Omentum, and Peritoneum: No simple free fluid ascites.  No pneumoperitoneum. No hemoperitoneum. No mesenteric hematoma identified. No organized fluid col. Lection. Pelvic Organs: Similar-appearing atrophic uterus. Bilateral adnexal regions are unremarkable. Lymph Nodes: No abdominal, pelvic, inguinal lymphadenopathy. Vasculature: No abdominal aorta or iliac aneurysm. No active contrast extravasation or pseudoaneurysm. Musculoskeletal: No significant soft tissue hematoma. No acute pelvic fracture. Densely sclerotic lesion within the left iliac bone. CT THORACIC SPINE FINDINGS Alignment: Normal. Vertebrae: No acute fracture or focal pathologic process. Redemonstration of a sclerotic lesion within the T3 vertebral body. No suspicious lytic or blastic osseous lesions. Mild multilevel degenerative changes of the spine. Similar-appearing Schmorl node of the superior endplate of the T6 vertebral body. No severe osseous neural foraminal or central canal stenosis. Paraspinal and other soft tissues: Negative. Disc levels: Maintained. CT LUMBAR SPINE FINDINGS Segmentation: 5 lumbar type vertebrae. Alignment: Normal. Vertebrae: No acute fracture or focal pathologic process. No suspicious lytic or blastic osseous lesions. Mild multilevel degenerative changes of the spine. No severe osseous neural foraminal or central canal stenosis. Paraspinal and other soft tissues: Negative. Disc levels: Maintained. IMPRESSION: 1. No acute intracranial abnormality. 2. No acute displaced fracture or traumatic listhesis of the cervical spine. 3. No pulmonary embolus. 4. Low lung volumes with suggestion of superimposed infection/inflammation. 5. No acute traumatic injury to the chest, abdomen, or pelvis. 6. No acute fracture or traumatic malalignment of the thoracic or lumbar spine. 7. 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  Electronically Signed   By: Iven Finn M.D.   On: 08/29/2020 00:38   CT Angio Chest PE W and/or Wo Contrast  Result Date: 08/29/2020 CLINICAL DATA:  s/p unwitnessed fall after rolling out of bed- pt believes she had +LOC and now c/o back pain and R parietal region pain -- no obvious deformities, ecchymosis, lacerations. EXAM: CT HEAD WITHOUT CONTRAST CT CERVICAL WITHOUT CONTRAST CT CHEST, ABDOMEN, PELVIS WITH CONTRAST CT THORACIC, AND LUMBAR SPINE WITH CONTRAST TECHNIQUE: Contiguous axial images were obtained from the base of the skull through the vertex without intravenous contrast. Multidetector CT imaging of the cervical, lumbar, and thoracic spine was performed with intravenous contrast. Multiplanar CT image reconstructions were also generated. Multidetector CT imaging of the chest, abdomen and pelvis was performed following the standard protocol during bolus administration of intravenous contrast. CONTRAST:  174mL OMNIPAQUE IOHEXOL 350 MG/ML SOLN COMPARISON:  CT chest, abdomen, pelvis, thoracic and lumbar spine 05/21/2019 FINDINGS: CT HEAD: Brain: Patchy and confluent areas of decreased attenuation are noted throughout the deep and periventricular white matter of the cerebral hemispheres bilaterally, compatible with chronic microvascular ischemic disease. No evidence of large-territorial acute infarction. No parenchymal hemorrhage. No mass lesion. No extra-axial collection. No mass effect or midline shift. No hydrocephalus. Basilar cisterns are patent. Vascular: No hyperdense vessel. Atherosclerotic calcifications are present within the cavernous internal carotid arteries. Skull: No acute fracture or focal lesion. Sinuses/Orbits: Paranasal sinuses and mastoid air cells are clear. The orbits are unremarkable. Other: None. CT CERVICAL SPINE FINDINGS Alignment: Normal. Skull base and vertebrae: Limited evaluation of the mid to lower cervical spine due to quantum mottle artifact. Multilevel mild  degenerative changes of the spine. No acute fracture. No aggressive appearing focal osseous lesion or focal pathologic process. Soft tissues and  spinal canal: No prevertebral fluid or swelling. No visible canal hematoma. Upper chest: Unremarkable. Other: None. CT CHEST FINDINGS Ports and Devices: None. Lungs/airways: Expiratory phase of respiration. Low lung volumes. Bilateral lower lobe and upper lobe atelectasis with question of superimposed ground-glass airspace opacities. Couple scattered pulmonary micronodules. No pulmonary mass. No pulmonary contusion or laceration. No pneumatocele formation. The central airways are patent. Pleura: No pleural effusion. No pneumothorax. No hemothorax. Lymph Nodes: No mediastinal, hilar, or axillary lymphadenopathy. Mediastinum: Satisfactory preferential opacification of the pulmonary artery. No vascular cutoff or filling defect noted to suggest pulmonary embolus. No pneumomediastinum. No aortic injury or mediastinal hematoma. The thoracic aorta is normal in caliber. Mild atherosclerotic plaque of the thoracic aorta. The heart is normal in size. No significant pericardial effusion. Likely mild 2 vessel coronary artery calcifications. The esophagus is unremarkable.  At least small volume hiatal hernia. The thyroid is unremarkable. Chest Wall / Breasts: No chest wall mass. Musculoskeletal: Chronic nondisplaced healed sternal fracture (12:83). No acute displaced rib or acute displaced sternal fracture. Old healed right rib fractures. Left shoulder calcific tendinitis (6:1). CT ABDOMEN AND PELVIS FINDINGS Liver: Not enlarged. No focal lesion. No laceration or subcapsular hematoma. Biliary System: The gallbladder is otherwise unremarkable with no radio-opaque gallstones. No biliary ductal dilatation. Pancreas: Normal pancreatic contour. No main pancreatic duct dilatation. Spleen: Not enlarged. No focal lesion. No laceration, subcapsular hematoma, or vascular injury. Adrenal Glands:  No nodularity bilaterally. Kidneys: Bilateral kidneys enhance symmetrically. Bilateral renal cortical scarring, left greater than right. No hydronephrosis. No contusion, laceration, or subcapsular hematoma. No injury to the vascular structures or collecting systems. No hydroureter. The urinary bladder is unremarkable. On delayed imaging, there is no urothelial wall thickening and there are no filling defects in the opacified portions of the bilateral collecting systems or ureters. 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 No small or large bowel wall thickening or dilatation. Stool throughout the colon. The appendix not definitely identified. Mesentery, Omentum, and Peritoneum: No simple free fluid ascites. No pneumoperitoneum. No hemoperitoneum. No mesenteric hematoma identified. No organized fluid col. Lection. Pelvic Organs: Similar-appearing atrophic uterus. Bilateral adnexal regions are unremarkable. Lymph Nodes: No abdominal, pelvic, inguinal lymphadenopathy. Vasculature: No abdominal aorta or iliac aneurysm. No active contrast extravasation or pseudoaneurysm. Musculoskeletal: No significant soft tissue hematoma. No acute pelvic fracture. Densely sclerotic lesion within the left iliac bone. CT THORACIC SPINE FINDINGS Alignment: Normal. Vertebrae: No acute fracture or focal pathologic process. Redemonstration of a sclerotic lesion within the T3 vertebral body. No suspicious lytic or blastic osseous lesions. Mild multilevel degenerative changes of the spine. Similar-appearing Schmorl node of the superior endplate of the T6 vertebral body. No severe osseous neural foraminal or central canal stenosis. Paraspinal and other soft tissues: Negative. Disc levels: Maintained. CT LUMBAR SPINE FINDINGS Segmentation: 5 lumbar type vertebrae. Alignment: Normal. Vertebrae: No acute fracture or focal pathologic process. No suspicious lytic or blastic osseous lesions. Mild multilevel degenerative changes of  the spine. No severe osseous neural foraminal or central canal stenosis. Paraspinal and other soft tissues: Negative. Disc levels: Maintained. IMPRESSION: 1. No acute intracranial abnormality. 2. No acute displaced fracture or traumatic listhesis of the cervical spine. 3. No pulmonary embolus. 4. Low lung volumes with suggestion of superimposed infection/inflammation. 5. No acute traumatic injury to the chest, abdomen, or pelvis. 6. No acute fracture or traumatic malalignment of the thoracic or lumbar spine. 7. Other imaging findings of potential clinical significance: At least small volume hiatal hernia in a patient status post  Roux-en-Y gastric bypass. Bilateral renal cortical scarring with an atrophic left kidney. Electronically Signed   By: Iven Finn M.D.   On: 08/29/2020 00:38   CT Cervical Spine Wo Contrast  Result Date: 08/29/2020 CLINICAL DATA:  s/p unwitnessed fall after rolling out of bed- pt believes she had +LOC and now c/o back pain and R parietal region pain -- no obvious deformities, ecchymosis, lacerations. EXAM: CT HEAD WITHOUT CONTRAST CT CERVICAL WITHOUT CONTRAST CT CHEST, ABDOMEN, PELVIS WITH CONTRAST CT THORACIC, AND LUMBAR SPINE WITH CONTRAST TECHNIQUE: Contiguous axial images were obtained from the base of the skull through the vertex without intravenous contrast. Multidetector CT imaging of the cervical, lumbar, and thoracic spine was performed with intravenous contrast. Multiplanar CT image reconstructions were also generated. Multidetector CT imaging of the chest, abdomen and pelvis was performed following the standard protocol during bolus administration of intravenous contrast. CONTRAST:  128mL OMNIPAQUE IOHEXOL 350 MG/ML SOLN COMPARISON:  CT chest, abdomen, pelvis, thoracic and lumbar spine 05/21/2019 FINDINGS: CT HEAD: Brain: Patchy and confluent areas of decreased attenuation are noted throughout the deep and periventricular white matter of the cerebral hemispheres  bilaterally, compatible with chronic microvascular ischemic disease. No evidence of large-territorial acute infarction. No parenchymal hemorrhage. No mass lesion. No extra-axial collection. No mass effect or midline shift. No hydrocephalus. Basilar cisterns are patent. Vascular: No hyperdense vessel. Atherosclerotic calcifications are present within the cavernous internal carotid arteries. Skull: No acute fracture or focal lesion. Sinuses/Orbits: Paranasal sinuses and mastoid air cells are clear. The orbits are unremarkable. Other: None. CT CERVICAL SPINE FINDINGS Alignment: Normal. Skull base and vertebrae: Limited evaluation of the mid to lower cervical spine due to quantum mottle artifact. Multilevel mild degenerative changes of the spine. No acute fracture. No aggressive appearing focal osseous lesion or focal pathologic process. Soft tissues and spinal canal: No prevertebral fluid or swelling. No visible canal hematoma. Upper chest: Unremarkable. Other: None. CT CHEST FINDINGS Ports and Devices: None. Lungs/airways: Expiratory phase of respiration. Low lung volumes. Bilateral lower lobe and upper lobe atelectasis with question of superimposed ground-glass airspace opacities. Couple scattered pulmonary micronodules. No pulmonary mass. No pulmonary contusion or laceration. No pneumatocele formation. The central airways are patent. Pleura: No pleural effusion. No pneumothorax. No hemothorax. Lymph Nodes: No mediastinal, hilar, or axillary lymphadenopathy. Mediastinum: Satisfactory preferential opacification of the pulmonary artery. No vascular cutoff or filling defect noted to suggest pulmonary embolus. No pneumomediastinum. No aortic injury or mediastinal hematoma. The thoracic aorta is normal in caliber. Mild atherosclerotic plaque of the thoracic aorta. The heart is normal in size. No significant pericardial effusion. Likely mild 2 vessel coronary artery calcifications. The esophagus is unremarkable.  At least  small volume hiatal hernia. The thyroid is unremarkable. Chest Wall / Breasts: No chest wall mass. Musculoskeletal: Chronic nondisplaced healed sternal fracture (12:83). No acute displaced rib or acute displaced sternal fracture. Old healed right rib fractures. Left shoulder calcific tendinitis (6:1). CT ABDOMEN AND PELVIS FINDINGS Liver: Not enlarged. No focal lesion. No laceration or subcapsular hematoma. Biliary System: The gallbladder is otherwise unremarkable with no radio-opaque gallstones. No biliary ductal dilatation. Pancreas: Normal pancreatic contour. No main pancreatic duct dilatation. Spleen: Not enlarged. No focal lesion. No laceration, subcapsular hematoma, or vascular injury. Adrenal Glands: No nodularity bilaterally. Kidneys: Bilateral kidneys enhance symmetrically. Bilateral renal cortical scarring, left greater than right. No hydronephrosis. No contusion, laceration, or subcapsular hematoma. No injury to the vascular structures or collecting systems. No hydroureter. The urinary bladder is unremarkable. On delayed imaging,  there is no urothelial wall thickening and there are no filling defects in the opacified portions of the bilateral collecting systems or ureters. 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 No small or large bowel wall thickening or dilatation. Stool throughout the colon. The appendix not definitely identified. Mesentery, Omentum, and Peritoneum: No simple free fluid ascites. No pneumoperitoneum. No hemoperitoneum. No mesenteric hematoma identified. No organized fluid col. Lection. Pelvic Organs: Similar-appearing atrophic uterus. Bilateral adnexal regions are unremarkable. Lymph Nodes: No abdominal, pelvic, inguinal lymphadenopathy. Vasculature: No abdominal aorta or iliac aneurysm. No active contrast extravasation or pseudoaneurysm. Musculoskeletal: No significant soft tissue hematoma. No acute pelvic fracture. Densely sclerotic lesion within the left iliac  bone. CT THORACIC SPINE FINDINGS Alignment: Normal. Vertebrae: No acute fracture or focal pathologic process. Redemonstration of a sclerotic lesion within the T3 vertebral body. No suspicious lytic or blastic osseous lesions. Mild multilevel degenerative changes of the spine. Similar-appearing Schmorl node of the superior endplate of the T6 vertebral body. No severe osseous neural foraminal or central canal stenosis. Paraspinal and other soft tissues: Negative. Disc levels: Maintained. CT LUMBAR SPINE FINDINGS Segmentation: 5 lumbar type vertebrae. Alignment: Normal. Vertebrae: No acute fracture or focal pathologic process. No suspicious lytic or blastic osseous lesions. Mild multilevel degenerative changes of the spine. No severe osseous neural foraminal or central canal stenosis. Paraspinal and other soft tissues: Negative. Disc levels: Maintained. IMPRESSION: 1. No acute intracranial abnormality. 2. No acute displaced fracture or traumatic listhesis of the cervical spine. 3. No pulmonary embolus. 4. Low lung volumes with suggestion of superimposed infection/inflammation. 5. No acute traumatic injury to the chest, abdomen, or pelvis. 6. No acute fracture or traumatic malalignment of the thoracic or lumbar spine. 7. 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 Electronically Signed   By: Iven Finn M.D.   On: 08/29/2020 00:38   CT ABDOMEN PELVIS W CONTRAST  Result Date: 08/29/2020 CLINICAL DATA:  s/p unwitnessed fall after rolling out of bed- pt believes she had +LOC and now c/o back pain and R parietal region pain -- no obvious deformities, ecchymosis, lacerations. EXAM: CT HEAD WITHOUT CONTRAST CT CERVICAL WITHOUT CONTRAST CT CHEST, ABDOMEN, PELVIS WITH CONTRAST CT THORACIC, AND LUMBAR SPINE WITH CONTRAST TECHNIQUE: Contiguous axial images  were obtained from the base of the skull through the vertex without intravenous contrast. Multidetector CT imaging of the cervical, lumbar, and thoracic spine was performed with intravenous contrast. Multiplanar CT image reconstructions were also generated. Multidetector CT imaging of the chest, abdomen and pelvis was performed following the standard protocol during bolus administration of intravenous contrast. CONTRAST:  141mL OMNIPAQUE IOHEXOL 350 MG/ML SOLN COMPARISON:  CT chest, abdomen, pelvis, thoracic and lumbar spine 05/21/2019 FINDINGS: CT HEAD: Brain: Patchy and confluent areas of decreased attenuation are noted throughout the deep and periventricular white matter of the cerebral hemispheres bilaterally, compatible with chronic microvascular ischemic disease. No evidence of large-territorial acute infarction. No parenchymal hemorrhage. No mass lesion. No extra-axial collection. No mass effect or midline shift. No hydrocephalus. Basilar cisterns are patent. Vascular: No hyperdense vessel. Atherosclerotic calcifications are present within the cavernous internal carotid arteries. Skull: No acute fracture or focal lesion. Sinuses/Orbits: Paranasal sinuses and mastoid air cells are clear. The orbits are unremarkable. Other: None. CT CERVICAL SPINE FINDINGS Alignment: Normal. Skull base and vertebrae: Limited evaluation of the mid to lower cervical spine due to  quantum mottle artifact. Multilevel mild degenerative changes of the spine. No acute fracture. No aggressive appearing focal osseous lesion or focal pathologic process. Soft tissues and spinal canal: No prevertebral fluid or swelling. No visible canal hematoma. Upper chest: Unremarkable. Other: None. CT CHEST FINDINGS Ports and Devices: None. Lungs/airways: Expiratory phase of respiration. Low lung volumes. Bilateral lower lobe and upper lobe atelectasis with question of superimposed ground-glass airspace opacities. Couple scattered pulmonary  micronodules. No pulmonary mass. No pulmonary contusion or laceration. No pneumatocele formation. The central airways are patent. Pleura: No pleural effusion. No pneumothorax. No hemothorax. Lymph Nodes: No mediastinal, hilar, or axillary lymphadenopathy. Mediastinum: Satisfactory preferential opacification of the pulmonary artery. No vascular cutoff or filling defect noted to suggest pulmonary embolus. No pneumomediastinum. No aortic injury or mediastinal hematoma. The thoracic aorta is normal in caliber. Mild atherosclerotic plaque of the thoracic aorta. The heart is normal in size. No significant pericardial effusion. Likely mild 2 vessel coronary artery calcifications. The esophagus is unremarkable.  At least small volume hiatal hernia. The thyroid is unremarkable. Chest Wall / Breasts: No chest wall mass. Musculoskeletal: Chronic nondisplaced healed sternal fracture (12:83). No acute displaced rib or acute displaced sternal fracture. Old healed right rib fractures. Left shoulder calcific tendinitis (6:1). CT ABDOMEN AND PELVIS FINDINGS Liver: Not enlarged. No focal lesion. No laceration or subcapsular hematoma. Biliary System: The gallbladder is otherwise unremarkable with no radio-opaque gallstones. No biliary ductal dilatation. Pancreas: Normal pancreatic contour. No main pancreatic duct dilatation. Spleen: Not enlarged. No focal lesion. No laceration, subcapsular hematoma, or vascular injury. Adrenal Glands: No nodularity bilaterally. Kidneys: Bilateral kidneys enhance symmetrically. Bilateral renal cortical scarring, left greater than right. No hydronephrosis. No contusion, laceration, or subcapsular hematoma. No injury to the vascular structures or collecting systems. No hydroureter. The urinary bladder is unremarkable. On delayed imaging, there is no urothelial wall thickening and there are no filling defects in the opacified portions of the bilateral collecting systems or ureters. Bowel: Surgical changes  related to Roux-en-Y gastric bypass. No small or large bowel wall thickening or dilatation. Stool throughout the colon. The appendix not definitely identified. Mesentery, Omentum, and Peritoneum: No simple free fluid ascites. No pneumoperitoneum. No hemoperitoneum. No mesenteric hematoma identified. No organized fluid col. Lection. Pelvic Organs: Similar-appearing atrophic uterus. Bilateral adnexal regions are unremarkable. Lymph Nodes: No abdominal, pelvic, inguinal lymphadenopathy. Vasculature: No abdominal aorta or iliac aneurysm. No active contrast extravasation or pseudoaneurysm. Musculoskeletal: No significant soft tissue hematoma. No acute pelvic fracture. Densely sclerotic lesion within the left iliac bone. CT THORACIC SPINE FINDINGS Alignment: Normal. Vertebrae: No acute fracture or focal pathologic process. Redemonstration of a sclerotic lesion within the T3 vertebral body. No suspicious lytic or blastic osseous lesions. Mild multilevel degenerative changes of the spine. Similar-appearing Schmorl node of the superior endplate of the T6 vertebral body. No severe osseous neural foraminal or central canal stenosis. Paraspinal and other soft tissues: Negative. Disc levels: Maintained. CT LUMBAR SPINE FINDINGS Segmentation: 5 lumbar type vertebrae. Alignment: Normal. Vertebrae: No acute fracture or focal pathologic process. No suspicious lytic or blastic osseous lesions. Mild multilevel degenerative changes of the spine. No severe osseous neural foraminal or central canal stenosis. Paraspinal and other soft tissues: Negative. Disc levels: Maintained. IMPRESSION: 1. No acute intracranial abnormality. 2. No acute displaced fracture or traumatic listhesis of the cervical spine. 3. No pulmonary embolus. 4. Low lung volumes with suggestion of superimposed infection/inflammation. 5. No acute traumatic injury to the chest, abdomen, or pelvis. 6. No acute fracture or traumatic  malalignment of the thoracic or lumbar  spine. 7. 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 Electronically Signed   By: Iven Finn M.D.   On: 08/29/2020 00:38   CT T-SPINE NO CHARGE  Result Date: 08/29/2020 CLINICAL DATA:  s/p unwitnessed fall after rolling out of bed- pt believes she had +LOC and now c/o back pain and R parietal region pain -- no obvious deformities, ecchymosis, lacerations. EXAM: CT HEAD WITHOUT CONTRAST CT CERVICAL WITHOUT CONTRAST CT CHEST, ABDOMEN, PELVIS WITH CONTRAST CT THORACIC, AND LUMBAR SPINE WITH CONTRAST TECHNIQUE: Contiguous axial images were obtained from the base of the skull through the vertex without intravenous contrast. Multidetector CT imaging of the cervical, lumbar, and thoracic spine was performed with intravenous contrast. Multiplanar CT image reconstructions were also generated. Multidetector CT imaging of the chest, abdomen and pelvis was performed following the standard protocol during bolus administration of intravenous contrast. CONTRAST:  127mL OMNIPAQUE IOHEXOL 350 MG/ML SOLN COMPARISON:  CT chest, abdomen, pelvis, thoracic and lumbar spine 05/21/2019 FINDINGS: CT HEAD: Brain: Patchy and confluent areas of decreased attenuation are noted throughout the deep and periventricular white matter of the cerebral hemispheres bilaterally, compatible with chronic microvascular ischemic disease. No evidence of large-territorial acute infarction. No parenchymal hemorrhage. No mass lesion. No extra-axial collection. No mass effect or midline shift. No hydrocephalus. Basilar cisterns are patent. Vascular: No hyperdense vessel. Atherosclerotic calcifications are present within the cavernous internal carotid arteries. Skull: No acute fracture or focal lesion. Sinuses/Orbits: Paranasal sinuses and mastoid air cells are clear. The orbits are  unremarkable. Other: None. CT CERVICAL SPINE FINDINGS Alignment: Normal. Skull base and vertebrae: Limited evaluation of the mid to lower cervical spine due to quantum mottle artifact. Multilevel mild degenerative changes of the spine. No acute fracture. No aggressive appearing focal osseous lesion or focal pathologic process. Soft tissues and spinal canal: No prevertebral fluid or swelling. No visible canal hematoma. Upper chest: Unremarkable. Other: None. CT CHEST FINDINGS Ports and Devices: None. Lungs/airways: Expiratory phase of respiration. Low lung volumes. Bilateral lower lobe and upper lobe atelectasis with question of superimposed ground-glass airspace opacities. Couple scattered pulmonary micronodules. No pulmonary mass. No pulmonary contusion or laceration. No pneumatocele formation. The central airways are patent. Pleura: No pleural effusion. No pneumothorax. No hemothorax. Lymph Nodes: No mediastinal, hilar, or axillary lymphadenopathy. Mediastinum: Satisfactory preferential opacification of the pulmonary artery. No vascular cutoff or filling defect noted to suggest pulmonary embolus. No pneumomediastinum. No aortic injury or mediastinal hematoma. The thoracic aorta is normal in caliber. Mild atherosclerotic plaque of the thoracic aorta. The heart is normal in size. No significant pericardial effusion. Likely mild 2 vessel coronary artery calcifications. The esophagus is unremarkable.  At least small volume hiatal hernia. The thyroid is unremarkable. Chest Wall / Breasts: No chest wall mass. Musculoskeletal: Chronic nondisplaced healed sternal fracture (12:83). No acute displaced rib or acute displaced sternal fracture. Old healed right rib fractures. Left shoulder calcific tendinitis (6:1). CT ABDOMEN AND PELVIS FINDINGS Liver: Not enlarged. No focal lesion. No laceration or subcapsular hematoma. Biliary System: The gallbladder is otherwise unremarkable with no radio-opaque gallstones. No biliary  ductal dilatation. Pancreas: Normal pancreatic contour. No main pancreatic duct dilatation. Spleen: Not enlarged. No focal lesion. No laceration, subcapsular hematoma, or vascular injury. Adrenal Glands: No nodularity bilaterally. Kidneys: Bilateral kidneys enhance symmetrically. Bilateral renal cortical scarring, left greater than right. No  hydronephrosis. No contusion, laceration, or subcapsular hematoma. No injury to the vascular structures or collecting systems. No hydroureter. The urinary bladder is unremarkable. On delayed imaging, there is no urothelial wall thickening and there are no filling defects in the opacified portions of the bilateral collecting systems or ureters. 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 No small or large bowel wall thickening or dilatation. Stool throughout the colon. The appendix not definitely identified. Mesentery, Omentum, and Peritoneum: No simple free fluid ascites. No pneumoperitoneum. No hemoperitoneum. No mesenteric hematoma identified. No organized fluid col. Lection. Pelvic Organs: Similar-appearing atrophic uterus. Bilateral adnexal regions are unremarkable. Lymph Nodes: No abdominal, pelvic, inguinal lymphadenopathy. Vasculature: No abdominal aorta or iliac aneurysm. No active contrast extravasation or pseudoaneurysm. Musculoskeletal: No significant soft tissue hematoma. No acute pelvic fracture. Densely sclerotic lesion within the left iliac bone. CT THORACIC SPINE FINDINGS Alignment: Normal. Vertebrae: No acute fracture or focal pathologic process. Redemonstration of a sclerotic lesion within the T3 vertebral body. No suspicious lytic or blastic osseous lesions. Mild multilevel degenerative changes of the spine. Similar-appearing Schmorl node of the superior endplate of the T6 vertebral body. No severe osseous neural foraminal or central canal stenosis. Paraspinal and other soft tissues: Negative. Disc levels: Maintained. CT LUMBAR SPINE FINDINGS  Segmentation: 5 lumbar type vertebrae. Alignment: Normal. Vertebrae: No acute fracture or focal pathologic process. No suspicious lytic or blastic osseous lesions. Mild multilevel degenerative changes of the spine. No severe osseous neural foraminal or central canal stenosis. Paraspinal and other soft tissues: Negative. Disc levels: Maintained. IMPRESSION: 1. No acute intracranial abnormality. 2. No acute displaced fracture or traumatic listhesis of the cervical spine. 3. No pulmonary embolus. 4. Low lung volumes with suggestion of superimposed infection/inflammation. 5. No acute traumatic injury to the chest, abdomen, or pelvis. 6. No acute fracture or traumatic malalignment of the thoracic or lumbar spine. 7. 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 Electronically Signed   By: Iven Finn M.D.   On: 08/29/2020 00:38   CT L-SPINE NO CHARGE  Result Date: 08/29/2020 CLINICAL DATA:  s/p unwitnessed fall after rolling out of bed- pt believes she had +LOC and now c/o back pain and R parietal region pain -- no obvious deformities, ecchymosis, lacerations. EXAM: CT HEAD WITHOUT CONTRAST CT CERVICAL WITHOUT CONTRAST CT CHEST, ABDOMEN, PELVIS WITH CONTRAST CT THORACIC, AND LUMBAR SPINE WITH CONTRAST TECHNIQUE: Contiguous axial images were obtained from the base of the skull through the vertex without intravenous contrast. Multidetector CT imaging of the cervical, lumbar, and thoracic spine was performed with intravenous contrast. Multiplanar CT image reconstructions were also generated. Multidetector CT imaging of the chest, abdomen and pelvis was performed following the standard protocol during bolus administration of intravenous contrast. CONTRAST:  143mL OMNIPAQUE IOHEXOL 350 MG/ML SOLN COMPARISON:  CT chest, abdomen, pelvis, thoracic and lumbar  spine 05/21/2019 FINDINGS: CT HEAD: Brain: Patchy and confluent areas of decreased attenuation are noted throughout the deep and periventricular white matter of the cerebral hemispheres bilaterally, compatible with chronic microvascular ischemic disease. No evidence of large-territorial acute infarction. No parenchymal hemorrhage. No mass lesion. No extra-axial collection. No mass effect or midline shift. No hydrocephalus. Basilar cisterns are patent. Vascular: No hyperdense vessel. Atherosclerotic calcifications are present within the cavernous internal carotid arteries. Skull: No acute fracture or focal lesion. Sinuses/Orbits: Paranasal sinuses and mastoid air cells are clear. The orbits  are unremarkable. Other: None. CT CERVICAL SPINE FINDINGS Alignment: Normal. Skull base and vertebrae: Limited evaluation of the mid to lower cervical spine due to quantum mottle artifact. Multilevel mild degenerative changes of the spine. No acute fracture. No aggressive appearing focal osseous lesion or focal pathologic process. Soft tissues and spinal canal: No prevertebral fluid or swelling. No visible canal hematoma. Upper chest: Unremarkable. Other: None. CT CHEST FINDINGS Ports and Devices: None. Lungs/airways: Expiratory phase of respiration. Low lung volumes. Bilateral lower lobe and upper lobe atelectasis with question of superimposed ground-glass airspace opacities. Couple scattered pulmonary micronodules. No pulmonary mass. No pulmonary contusion or laceration. No pneumatocele formation. The central airways are patent. Pleura: No pleural effusion. No pneumothorax. No hemothorax. Lymph Nodes: No mediastinal, hilar, or axillary lymphadenopathy. Mediastinum: Satisfactory preferential opacification of the pulmonary artery. No vascular cutoff or filling defect noted to suggest pulmonary embolus. No pneumomediastinum. No aortic injury or mediastinal hematoma. The thoracic aorta is normal in caliber. Mild atherosclerotic  plaque of the thoracic aorta. The heart is normal in size. No significant pericardial effusion. Likely mild 2 vessel coronary artery calcifications. The esophagus is unremarkable.  At least small volume hiatal hernia. The thyroid is unremarkable. Chest Wall / Breasts: No chest wall mass. Musculoskeletal: Chronic nondisplaced healed sternal fracture (12:83). No acute displaced rib or acute displaced sternal fracture. Old healed right rib fractures. Left shoulder calcific tendinitis (6:1). CT ABDOMEN AND PELVIS FINDINGS Liver: Not enlarged. No focal lesion. No laceration or subcapsular hematoma. Biliary System: The gallbladder is otherwise unremarkable with no radio-opaque gallstones. No biliary ductal dilatation. Pancreas: Normal pancreatic contour. No main pancreatic duct dilatation. Spleen: Not enlarged. No focal lesion. No laceration, subcapsular hematoma, or vascular injury. Adrenal Glands: No nodularity bilaterally. Kidneys: Bilateral kidneys enhance symmetrically. Bilateral renal cortical scarring, left greater than right. No hydronephrosis. No contusion, laceration, or subcapsular hematoma. No injury to the vascular structures or collecting systems. No hydroureter. The urinary bladder is unremarkable. On delayed imaging, there is no urothelial wall thickening and there are no filling defects in the opacified portions of the bilateral collecting systems or ureters. 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 No small or large bowel wall thickening or dilatation. Stool throughout the colon. The appendix not definitely identified. Mesentery, Omentum, and Peritoneum: No simple free fluid ascites. No pneumoperitoneum. No hemoperitoneum. No mesenteric hematoma identified. No organized fluid col. Lection. Pelvic Organs: Similar-appearing atrophic uterus. Bilateral adnexal regions are unremarkable. Lymph Nodes: No abdominal, pelvic, inguinal lymphadenopathy. Vasculature: No abdominal aorta or iliac  aneurysm. No active contrast extravasation or pseudoaneurysm. Musculoskeletal: No significant soft tissue hematoma. No acute pelvic fracture. Densely sclerotic lesion within the left iliac bone. CT THORACIC SPINE FINDINGS Alignment: Normal. Vertebrae: No acute fracture or focal pathologic process. Redemonstration of a sclerotic lesion within the T3 vertebral body. No suspicious lytic or blastic osseous lesions. Mild multilevel degenerative changes of the spine. Similar-appearing Schmorl node of the superior endplate of the T6 vertebral body. No severe osseous neural foraminal or central canal stenosis. Paraspinal and other soft tissues: Negative. Disc levels: Maintained. CT LUMBAR SPINE FINDINGS Segmentation: 5 lumbar type vertebrae. Alignment: Normal. Vertebrae: No acute fracture or focal pathologic process. No suspicious lytic or blastic osseous lesions. Mild multilevel degenerative changes of the spine. No severe osseous neural foraminal or central canal stenosis. Paraspinal and other soft tissues: Negative. Disc levels: Maintained. IMPRESSION: 1. No acute intracranial abnormality. 2. No acute displaced fracture or traumatic listhesis of the cervical spine. 3. No pulmonary embolus. 4.  Low lung volumes with suggestion of superimposed infection/inflammation. 5. No acute traumatic injury to the chest, abdomen, or pelvis. 6. No acute fracture or traumatic malalignment of the thoracic or lumbar spine. 7. 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 Electronically Signed   By: Iven Finn M.D.   On: 08/29/2020 00:38   DG Chest Portable 1 View  Result Date: 08/28/2020 CLINICAL DATA:  Shortness of breath. Unwitnessed fall after rolling out of bed. EXAM: PORTABLE CHEST 1 VIEW COMPARISON:  Chest x-ray 06/09/2019, CT chest 05/21/2019 FINDINGS: The heart  size and mediastinal contours are unchanged. No focal consolidation. Bilateral lower lung zone linear atelectasis versus scarring. Similar appearing coarsened interstitial markings with no overt pulmonary edema. No pleural effusion. No pneumothorax. No acute osseous abnormality.  Old healed right rib fractures. IMPRESSION: Similar appearing coarsened interstitial markings with no definite focal consolidation or acute cardiopulmonary abnormality. Electronically Signed   By: Iven Finn M.D.   On: 08/28/2020 22:18    (Echo, Carotid, EGD, Colonoscopy, ERCP)    Subjective: Patient seen and examined the day of discharge.  Stable, no distress.  States she feels better than admission.  Stable for discharge at this time.  Discharge Exam: Vitals:   08/30/20 0557 08/30/20 0706  BP: (!) 140/57 (!) 123/59  Pulse: 71 69  Resp: 16 18  Temp: 97.9 F (36.6 C) 97.9 F (36.6 C)  SpO2: 93% 96%   Vitals:   08/29/20 1952 08/29/20 2359 08/30/20 0557 08/30/20 0706  BP: (!) 116/48 (!) 125/50 (!) 140/57 (!) 123/59  Pulse: 75 69 71 69  Resp: 16 17 16 18   Temp: 98.6 F (37 C) 98.4 F (36.9 C) 97.9 F (36.6 C) 97.9 F (36.6 C)  TempSrc: Oral Oral    SpO2: 92% 93% 93% 96%  Weight:      Height:        General: Pt is alert, awake, not in acute distress Cardiovascular: RRR, S1/S2 +, no rubs, no gallops Respiratory: CTA bilaterally, no wheezing, no rhonchi Abdominal: Soft, NT, ND, bowel sounds + Extremities: no edema, no cyanosis    The results of significant diagnostics from this hospitalization (including imaging, microbiology, ancillary and laboratory) are listed below for reference.     Microbiology: Recent Results (from the past 240 hour(s))  Resp Panel by RT-PCR (Flu A&B, Covid) Nasopharyngeal Swab     Status: None   Collection Time: 08/28/20 10:40 PM   Specimen: Nasopharyngeal Swab; Nasopharyngeal(NP) swabs in vial transport medium  Result Value Ref Range Status   SARS Coronavirus 2 by  RT PCR NEGATIVE NEGATIVE Final    Comment: (NOTE) SARS-CoV-2 target nucleic acids are NOT DETECTED.  The SARS-CoV-2 RNA is generally detectable in upper respiratory specimens during the acute phase of infection. The lowest concentration of SARS-CoV-2 viral copies this assay can detect is 138 copies/mL. A negative result does not preclude SARS-Cov-2 infection and should not be used as the sole basis for treatment or other patient management decisions. A negative result may occur with  improper specimen collection/handling, submission of specimen other than nasopharyngeal swab, presence of viral mutation(s) within the areas targeted by this assay, and inadequate number of viral copies(<138 copies/mL). A negative result must be combined with clinical observations, patient history, and epidemiological information. The expected result is Negative.  Fact Sheet for Patients:  EntrepreneurPulse.com.au  Fact Sheet for Healthcare  Providers:  SeriousBroker.it  This test is no t yet approved or cleared by the [place] and  has been authorized for detection and/or diagnosis of SARS-CoV-2 by FDA under an Emergency Use Authorization (EUA). This EUA will remain  in effect (meaning this test can be used) for the duration of the COVID-19 declaration under Section 564(b)(1) of the Act, 21 U.S.C.section 360bbb-3(b)(1), unless the authorization is terminated  or revoked sooner.       Influenza A by PCR NEGATIVE NEGATIVE Final   Influenza B by PCR NEGATIVE NEGATIVE Final    Comment: (NOTE) The Xpert Xpress SARS-CoV-2/FLU/RSV plus assay is intended as an aid in the diagnosis of influenza from Nasopharyngeal swab specimens and should not be used as a sole basis for treatment. Nasal washings and aspirates are unacceptable for Xpert Xpress SARS-CoV-2/FLU/RSV testing.  Fact Sheet for Patients: BloggerCourse.com  Fact Sheet  for Healthcare Providers: SeriousBroker.it  This test is not yet approved or cleared by the [place] FDA and has been authorized for detection and/or diagnosis of SARS-CoV-2 by FDA under an Emergency Use Authorization (EUA). This EUA will remain in effect (meaning this test can be used) for the duration of the COVID-19 declaration under Section 564(b)(1) of the Act, 21 U.S.C. section 360bbb-3(b)(1), unless the authorization is terminated or revoked.  Performed at [facility], [address] [place], [place] [address]   Urine Culture     Status: Abnormal   Collection Time: 08/28/20 10:40 PM   Specimen: Urine, Random  Result Value Ref Range Status   Specimen Description   Final    URINE, RANDOM Performed at [facility], [address] [place], [place] [address]    Special Requests   Final    NONE Performed at [facility], [address] [place], [place] [address]    Culture (A)  Final    <10,000 COLONIES/mL INSIGNIFICANT GROWTH Performed at [facility], [address] [place], [place] [address]    Report Status 08/30/2020 FINAL  Final  Culture, blood (Routine X 2) w Reflex to ID Panel     Status: None (Preliminary result)   Collection Time: 08/29/20 12:39 AM   Specimen: BLOOD  Result Value Ref Range Status   Specimen Description BLOOD LEFT [facility]  Final   Special Requests   Final    BOTTLES DRAWN AEROBIC AND ANAEROBIC Blood Culture adequate volume   Culture   Final    NO GROWTH 1 DAY Performed at [facility], [address] [place], [place] [address]    Report Status PENDING  Incomplete  Culture, blood (Routine X 2) w Reflex to ID Panel     Status: None (Preliminary result)   Collection Time: 08/29/20  2:50 AM   Specimen: BLOOD  Result Value Ref Range Status   Specimen Description BLOOD RIGHT WRIST  Final   Special Requests   Final    BOTTLES DRAWN AEROBIC AND ANAEROBIC Blood Culture  adequate volume   Culture   Final    NO GROWTH 1 DAY Performed at [facility], [address], [place], [place] [address]    Report Status PENDING  Incomplete     Labs: BNP (last 3 results) No results for input(s): BNP in the last 8760 hours. Basic Metabolic Panel: Recent Labs  Lab 08/28/20 2240 08/29/20 0220  NA 138  --   K 4.9  --   CL 109  --   CO2 23  --  GLUCOSE 80  --   BUN 23  --   CREATININE 1.07* 0.99  CALCIUM 8.8*  --    Liver Function Tests: Recent Labs  Lab 08/28/20 2240  AST 24  ALT 17  ALKPHOS 72  BILITOT 0.6  PROT 5.8*  ALBUMIN 3.3*   No results for input(s): LIPASE, AMYLASE in the last 168 hours. No results for input(s): AMMONIA in the last 168 hours. CBC: Recent Labs  Lab 08/28/20 2240 08/29/20 0220  WBC 7.9 8.7  NEUTROABS 4.0  --   HGB 11.9* 12.8  HCT 39.0 41.9  MCV 101.3* 101.7*  PLT 161 172   Cardiac Enzymes: Recent Labs  Lab 08/28/20 2240  CKTOTAL 248*   BNP: Invalid input(s): POCBNP CBG: No results for input(s): GLUCAP in the last 168 hours. D-Dimer No results for input(s): DDIMER in the last 72 hours. Hgb A1c No results for input(s): HGBA1C in the last 72 hours. Lipid Profile No results for input(s): CHOL, HDL, LDLCALC, TRIG, CHOLHDL, LDLDIRECT in the last 72 hours. Thyroid function studies No results for input(s): TSH, T4TOTAL, T3FREE, THYROIDAB in the last 72 hours.  Invalid input(s): FREET3 Anemia work up No results for input(s): VITAMINB12, FOLATE, FERRITIN, TIBC, IRON, RETICCTPCT in the last 72 hours. Urinalysis    Component Value Date/Time   COLORURINE AMBER (A) 08/28/2020 2240   APPEARANCEUR CLEAR (A) 08/28/2020 2240   LABSPEC 1.020 08/28/2020 2240   PHURINE 5.0 08/28/2020 2240   GLUCOSEU NEGATIVE 08/28/2020 2240   HGBUR NEGATIVE 08/28/2020 2240   BILIRUBINUR NEGATIVE 08/28/2020 2240   KETONESUR NEGATIVE 08/28/2020 2240   PROTEINUR 30 (A) 08/28/2020 2240   NITRITE POSITIVE (A) 08/28/2020 2240    LEUKOCYTESUR NEGATIVE 08/28/2020 2240   Sepsis Labs Invalid input(s): PROCALCITONIN,  WBC,  LACTICIDVEN Microbiology Recent Results (from the past 240 hour(s))  Resp Panel by RT-PCR (Flu A&B, Covid) Nasopharyngeal Swab     Status: None   Collection Time: 08/28/20 10:40 PM   Specimen: Nasopharyngeal Swab; Nasopharyngeal(NP) swabs in vial transport medium  Result Value Ref Range Status   SARS Coronavirus 2 by RT PCR NEGATIVE NEGATIVE Final    Comment: (NOTE) SARS-CoV-2 target nucleic acids are NOT DETECTED.  The SARS-CoV-2 RNA is generally detectable in upper respiratory specimens during the acute phase of infection. The lowest concentration of SARS-CoV-2 viral copies this assay can detect is 138 copies/mL. A negative result does not preclude SARS-Cov-2 infection and should not be used as the sole basis for treatment or other patient management decisions. A negative result may occur with  improper specimen collection/handling, submission of specimen other than nasopharyngeal swab, presence of viral mutation(s) within the areas targeted by this assay, and inadequate number of viral copies(<138 copies/mL). A negative result must be combined with clinical observations, patient history, and epidemiological information. The expected result is Negative.  Fact Sheet for Patients:  EntrepreneurPulse.com.au  Fact Sheet for Healthcare Providers:  IncredibleEmployment.be  This test is no t yet approved or cleared by the [place] FDA and  has been authorized for detection and/or diagnosis of SARS-CoV-2 by FDA under an Emergency Use Authorization (EUA). This EUA will remain  in effect (meaning this test can be used) for the duration of the COVID-19 declaration under Section 564(b)(1) of the Act, 21 U.S.C.section 360bbb-3(b)(1), unless the authorization is terminated  or revoked sooner.       Influenza A by PCR NEGATIVE NEGATIVE Final   Influenza  B by PCR NEGATIVE NEGATIVE Final    Comment: (NOTE)  The Xpert Xpress SARS-CoV-2/FLU/RSV plus assay is intended as an aid in the diagnosis of influenza from Nasopharyngeal swab specimens and should not be used as a sole basis for treatment. Nasal washings and aspirates are unacceptable for Xpert Xpress SARS-CoV-2/FLU/RSV testing.  Fact Sheet for Patients: EntrepreneurPulse.com.au  Fact Sheet for Healthcare Providers: IncredibleEmployment.be  This test is not yet approved or cleared by the [place] FDA and has been authorized for detection and/or diagnosis of SARS-CoV-2 by FDA under an Emergency Use Authorization (EUA). This EUA will remain in effect (meaning this test can be used) for the duration of the COVID-19 declaration under Section 564(b)(1) of the Act, 21 U.S.C. section 360bbb-3(b)(1), unless the authorization is terminated or revoked.  Performed at [facility], [address] [place], [place] [address]   Urine Culture     Status: Abnormal   Collection Time: 08/28/20 10:40 PM   Specimen: Urine, Random  Result Value Ref Range Status   Specimen Description   Final    URINE, RANDOM Performed at [facility], [address] [place], [place] [address]    Special Requests   Final    NONE Performed at [facility], [address] [place], [place] [address]    Culture (A)  Final    <10,000 COLONIES/mL INSIGNIFICANT GROWTH Performed at [facility], [address] [place], [place] [address]    Report Status 08/30/2020 FINAL  Final  Culture, blood (Routine X 2) w Reflex to ID Panel     Status: None (Preliminary result)   Collection Time: 08/29/20 12:39 AM   Specimen: BLOOD  Result Value Ref Range Status   Specimen Description BLOOD LEFT [facility]  Final   Special Requests   Final    BOTTLES DRAWN AEROBIC AND ANAEROBIC Blood Culture adequate volume   Culture   Final    NO GROWTH 1 DAY Performed at  [facility], [address], [place], [place] [address]    Report Status PENDING  Incomplete  Culture, blood (Routine X 2) w Reflex to ID Panel     Status: None (Preliminary result)   Collection Time: 08/29/20  2:50 AM   Specimen: BLOOD  Result Value Ref Range Status   Specimen Description BLOOD RIGHT WRIST  Final   Special Requests   Final    BOTTLES DRAWN AEROBIC AND ANAEROBIC Blood Culture adequate volume   Culture   Final    NO GROWTH 1 DAY Performed at [facility], [address], [place],  [address]    Report Status PENDING  Incomplete     Time coordinating discharge: Over 30 minutes  SIGNED:   Sidney Ace, MD  [facility] 08/30/2020, 10:35 AM Pager   If 7PM-7AM, please contact night-coverage

## 2020-08-30 NOTE — TOC Transition Note (Signed)
Transition of Care [facility]) - CM/SW Discharge Note   Patient Details  Name: Jaasia Viglione MRN: [id number] Date of Birth: [birth year]  [facility]) CM/SW Contact:  Shelbie Hutching, RN Phone Number: 08/30/2020, 11:02 AM   Clinical Narrative:    DC summary faxed to [facility] at [phone].   Final next level of care: [place] [facility] Barriers to Discharge: Barriers Resolved   Patient Goals and CMS Choice Patient states their goals for this hospitalization and ongoing recovery are:: Patient will be going home followed by [facility]      Discharge Placement                Patient to be transferred to facility by: [facility] will transport patient home Name of family member notified: [name] -granddaughter Patient and family notified of of transfer: 08/30/20  Discharge Plan and Services   Discharge Planning Services: CM Consult            DME Arranged: N/A         HH Arranged: PT [place] Agency: Other - See comment ([facility] program)        Social Determinants of Health (SDOH) Interventions     Readmission Risk Interventions Readmission Risk Prevention Plan 01/30/2019  Post Dischage Appt Complete  Medication Screening Complete  Transportation Screening Complete  Some recent data might be hidden

## 2020-08-30 NOTE — TOC Initial Note (Signed)
Transition of Care [facility]) - Initial/Assessment Note    Patient Details  Name: Shelly Freeman MRN: [id number] Date of Birth: [birth year]  Transition of Care [facility]) CM/SW Contact:    Shelbie Hutching, RN Phone Number: 08/30/2020, 10:57 AM  Clinical Narrative:                 Patient admitted to the hospital with pneumonia.  Patient is medically cleared for discharge home today.  Patient is part of the [facility] program in [place].  Patient lives alone, granddaughter is the emergency contact and is notified that patient will discharge today.  [facility] will be picking the patient up at 1230 today to transport her home.  [facility] will arrange a home visit with the patient and set up any needed home services.    Expected Discharge Plan: [place] Barriers to Discharge: Barriers Resolved   Patient Goals and CMS Choice Patient states their goals for this hospitalization and ongoing recovery are:: Patient will be going home followed by [facility]      Expected Discharge Plan and Services Expected Discharge Plan: [place]   Discharge Planning Services: CM Consult   Living arrangements for the past 2 months: Apartment Expected Discharge Date: 08/30/20               DME Arranged: N/A         HH Arranged: PT HH Agency: Other - See comment ([facility] program)        Prior Living Arrangements/Services Living arrangements for the past 2 months: Apartment Lives with:: Self Patient language and need for interpreter reviewed:: Yes Do you feel safe going back to the place where you live?: Yes      Need for Family Participation in Patient Care: Yes (Comment) Care giver support system in place?: Yes (comment) ([facility] and granddaughter) Current home services: DME (cane and rolling walker) Criminal Activity/Legal Involvement Pertinent to Current Situation/Hospitalization: No - Comment as needed  Activities of Daily Living Home Assistive Devices/Equipment: Walker (specify  type) ADL Screening (condition at time of admission) Patient's cognitive ability adequate to safely complete daily activities?: Yes Is the patient deaf or have difficulty hearing?: Yes Does the patient have difficulty seeing, even when wearing glasses/contacts?: No Does the patient have difficulty concentrating, remembering, or making decisions?: No Patient able to express need for assistance with ADLs?: Yes Does the patient have difficulty dressing or bathing?: No Independently performs ADLs?: Yes (appropriate for developmental age) Does the patient have difficulty walking or climbing stairs?: No Weakness of Legs: Both Weakness of Arms/Hands: None  Permission Sought/Granted Permission sought to share information with : Case Manager,Facility Contact Representative,Family Supports Permission granted to share information with : Yes, Verbal Permission Granted  Share Information with NAME: [name]  Permission granted to share info w AGENCY: [facility]  Permission granted to share info w Relationship: granddaughter     Emotional Assessment Appearance:: Appears stated age     Orientation: : Oriented to Self,Oriented to Place,Oriented to  Time,Oriented to Situation Alcohol / Substance Use: Not Applicable Psych Involvement: No (comment)  Admission diagnosis:  Fall [W19.XXXA] CAP (community acquired pneumonia) [J18.9] Acute UTI [N39.0] Bilateral pneumonia [J18.9] Community acquired pneumonia, bilateral [J18.9] Patient Active Problem List   Diagnosis Date Noted  . Bilateral pneumonia 08/29/2020  . AKI (acute kidney injury) ([place]) 08/29/2020  . COPD (chronic obstructive pulmonary disease) ([place]) 08/29/2020  . Fall at home, initial encounter 08/29/2020  . CAP (community acquired pneumonia) 08/29/2020  .  Obesity, Class III, BMI 40-49.9 (morbid obesity) ([place]) 05/23/2019  . UTI (urinary tract infection) 05/23/2019  . Right rib fracture 05/23/2019  . SIRS (systemic inflammatory response syndrome)  ([place]) 05/23/2019  . Bacteremia due to Gram-negative bacteria 05/23/2019  . Acute bronchiolitis 01/28/2019  . Left anterior knee pain 10/24/2016  . Right upper quadrant pain 10/24/2016  . Urination pain 06/26/2016  . DDD (degenerative disc disease), lumbar 05/14/2016  . Lipoma of skin 05/14/2016  . Vitamin D deficiency, unspecified 05/14/2016  . Major neurocognitive disorder, due to vascular disease, with behavioral disturbance, mild ([place]) 04/15/2016  . Severe recurrent major depression with psychotic features ([place]) 04/04/2016  . Lupus ([place]) 04/04/2016  . GERD (gastroesophageal reflux disease) 04/04/2016  . COPD with acute bronchitis ([place]) 04/04/2016  . Acute respiratory failure with hypoxia ([place]) 04/03/2016  . Encephalopathy, metabolic 33/82/5053  . Hypokalemia 04/03/2016  . Pain in limb 04/03/2016  . Hypothyroidism 04/03/2016  . Essential hypertension 04/03/2016  . Intentional opiate overdose ([place]) 03/30/2016  . Age related osteoporosis 03/19/2016  . Chronic midline low back pain without sciatica 03/19/2016  . Encounter for long-term (current) use of high-risk medication 03/19/2016  . Primary osteoarthritis of both knees 03/19/2016   PCP:  System, Provider Not In Pharmacy:   [id number] [place], [address] [name] [address] Phone: [phone] Fax: [phone]  [id number] [place], [name] - [address] [name] [address] Phone: [phone] Fax: [phone]     Social Determinants of Health (SDOH) Interventions    Readmission Risk Interventions Readmission Risk Prevention Plan 01/30/2019  Post Dischage Appt Complete  Medication Screening Complete  Transportation Screening Complete  Some recent data might be hidden

## 2020-09-03 LAB — CULTURE, BLOOD (ROUTINE X 2)
Culture: NO GROWTH
Culture: NO GROWTH
Special Requests: ADEQUATE
Special Requests: ADEQUATE

## 2020-09-16 ENCOUNTER — Ambulatory Visit
Admission: RE | Admit: 2020-09-16 | Discharge: 2020-09-16 | Disposition: A | Discharge status: Home / Self Care | Payer: Medicare (Managed Care) | Source: Ambulatory Visit | Attending: Family Medicine | Admitting: Family Medicine

## 2020-09-16 ENCOUNTER — Other Ambulatory Visit: Payer: Self-pay

## 2020-09-16 DIAGNOSIS — M170 Bilateral primary osteoarthritis of knee: Secondary | ICD-10-CM | POA: Diagnosis present

## 2020-09-27 ENCOUNTER — Other Ambulatory Visit: Payer: Self-pay | Admitting: Orthopedic Surgery

## 2020-10-14 ENCOUNTER — Inpatient Hospital Stay: Admission: RE | Admit: 2020-10-14 | Payer: Medicare (Managed Care) | Source: Ambulatory Visit

## 2020-10-19 ENCOUNTER — Inpatient Hospital Stay: Admission: RE | Admit: 2020-10-19 | Payer: Medicare (Managed Care) | Source: Ambulatory Visit

## 2020-10-19 ENCOUNTER — Other Ambulatory Visit: Payer: Self-pay

## 2020-10-19 ENCOUNTER — Encounter
Admission: RE | Admit: 2020-10-19 | Discharge: 2020-10-19 | Disposition: A | Discharge status: Home / Self Care | Payer: Medicare (Managed Care) | Source: Ambulatory Visit | Attending: Orthopedic Surgery | Admitting: Orthopedic Surgery

## 2020-10-19 DIAGNOSIS — Z01818 Encounter for other preprocedural examination: Secondary | ICD-10-CM | POA: Insufficient documentation

## 2020-10-19 LAB — COMPREHENSIVE METABOLIC PANEL
ALT: 12 U/L (ref 0–44)
AST: 16 U/L (ref 15–41)
Albumin: 4.1 g/dL (ref 3.5–5.0)
Alkaline Phosphatase: 83 U/L (ref 38–126)
Anion gap: 5 (ref 5–15)
BUN: 11 mg/dL (ref 8–23)
CO2: 25 mmol/L (ref 22–32)
Calcium: 8.9 mg/dL (ref 8.9–10.3)
Chloride: 110 mmol/L (ref 98–111)
Creatinine, Ser: 0.69 mg/dL (ref 0.44–1.00)
GFR, Estimated: >60 mL/min (ref >60)
Glucose, Bld: 96 mg/dL (ref 70–99)
Potassium: 4.2 mmol/L (ref 3.5–5.1)
Sodium: 140 mmol/L (ref 135–145)
Total Bilirubin: 0.5 mg/dL (ref 0.3–1.2)
Total Protein: 6.9 g/dL (ref 6.5–8.1)

## 2020-10-19 LAB — CBC WITH DIFFERENTIAL/PLATELET
Abs Immature Granulocytes: 0.01 10*3/uL (ref 0.00–0.07)
Basophils Absolute: 0.1 10*3/uL (ref 0.0–0.1)
Basophils Relative: 1 %
Eosinophils Absolute: 0.1 10*3/uL (ref 0.0–0.5)
Eosinophils Relative: 1 %
HCT: 41.9 % (ref 36.0–46.0)
Hemoglobin: 13.3 g/dL (ref 12.0–15.0)
Immature Granulocytes: 0 %
Lymphocytes Relative: 35 %
Lymphs Abs: 2.5 10*3/uL (ref 0.7–4.0)
MCH: 31.7 pg (ref 26.0–34.0)
MCHC: 31.7 g/dL (ref 30.0–36.0)
MCV: 100 fL (ref 80.0–100.0)
Monocytes Absolute: 0.5 10*3/uL (ref 0.1–1.0)
Monocytes Relative: 6 %
Neutro Abs: 3.9 10*3/uL (ref 1.7–7.7)
Neutrophils Relative %: 57 %
Platelets: 172 10*3/uL (ref 150–400)
RBC: 4.19 MIL/uL (ref 3.87–5.11)
RDW: 13.9 % (ref 11.5–15.5)
WBC: 7 10*3/uL (ref 4.0–10.5)
nRBC: 0 % (ref 0.0–0.2)

## 2020-10-19 LAB — URINALYSIS, ROUTINE W REFLEX MICROSCOPIC
Bilirubin Urine: NEGATIVE
Glucose, UA: NEGATIVE mg/dL
Hgb urine dipstick: NEGATIVE
Ketones, ur: NEGATIVE mg/dL
Nitrite: NEGATIVE
Protein, ur: NEGATIVE mg/dL
Specific Gravity, Urine: 1.023 (ref 1.005–1.030)
pH: 5 (ref 5.0–8.0)

## 2020-10-19 LAB — TYPE AND SCREEN
ABO/RH(D): O POS
Antibody Screen: NEGATIVE

## 2020-10-19 LAB — SURGICAL PCR SCREEN
MRSA, PCR: NEGATIVE
Staphylococcus aureus: NEGATIVE

## 2020-10-19 NOTE — Patient Instructions (Addendum)
Your procedure is scheduled on: Tuesday 10/25/20 Report to [facility]. To find out your arrival time please call [phone] between 1PM - 3PM on Monday 10/24/20.  Remember: Instructions that are not followed completely may result in serious medical risk, up to and including death, or upon the discretion of your surgeon and anesthesiologist your surgery may need to be rescheduled.     _X__ 1. Do not eat food or drink any liquids after midnight the night before your procedure.                   __X__2.  On the morning of surgery brush your teeth with toothpaste and water, you                 may rinse your mouth with mouthwash if you wish.  Do not swallow any              toothpaste of mouthwash.     _X__ 3.  No Alcohol for 24 hours before or after surgery.   _X__ 4.  Do Not Smoke or use e-cigarettes For 24 Hours Prior to Your Surgery.                 Do not use any chewable tobacco products for at least 6 hours prior to                 surgery.  ____  5.  Bring all medications with you on the day of surgery if instructed.   __X__  6.  Notify your doctor if there is any change in your medical condition      (cold, fever, infections).     Do not wear jewelry, make-up, hairpins, clips or nail polish. Do not wear lotions, powders, or perfumes.  Do not shave 48 hours prior to surgery. Men may shave face and neck. Do not bring valuables to the hospital.    [facility] is not responsible for any belongings or valuables.  Contacts, dentures/partials or body piercings may not be worn into surgery. Bring a case for your contacts, glasses or hearing aids, a denture cup will be supplied. Leave your suitcase in the car. After surgery it may be brought to your room. For patients admitted to the hospital, discharge time is determined by your treatment team.   Patients discharged the day of surgery will not be allowed to drive home.   Please  read over the following fact sheets that you were given:   MRSA Information, CHG soap  __X__ Take these medicines the morning of surgery with A SIP OF WATER:    1. escitalopram (LEXAPRO) 20 MG tablet  2. famotidine (PEPCID) 20 MG tablet  3. gabapentin (NEURONTIN) 800 MG tablet  4. isosorbide mononitrate (IMDUR) 30 MG 24 hr tablet  5. levothyroxine (SYNTHROID, LEVOTHROID) 75 MCG tablet  6. metoprolol succinate (TOPROL-XL) 25 MG 24 hr tablet  7. pantoprazole (PROTONIX) 20 MG tablet  8. May take lorazepam if needed  ____ Fleet Enema (as directed)   __X__ Use CHG Soap/SAGE wipes as directed  ____ Use inhalers on the day of surgery  ____ Stop metformin/Janumet/Farxiga 2 days prior to surgery    ____ Take 1/2 of usual insulin dose the night before surgery. No insulin the morning          of surgery.   ____ Stop Blood Thinners Coumadin/Plavix/Xarelto/Pleta/Pradaxa/Eliquis/Effient/Aspirin  on   Or contact your Surgeon, Cardiologist  or Medical Doctor regarding  ability to stop your blood thinners  __X__ Stop Anti-inflammatories 7 days before surgery such as Advil, Ibuprofen, Motrin,  BC or Goodies Powder, Naprosyn, Naproxen, Aleve, Aspirin    __X__ Stop all herbal supplements, fish oil or vitamin E until after surgery.    ____ Bring C-Pap to the hospital.      Su procedimiento est programado para: Martes 10/25/20 [place] en el DEPARTAMENTO DE CIRUGA DE DA UBICADO EN LA ENTRADA DEL CENTRO MDICO DEL 2. PISO. Para averiguar su hora de llegada, llame al [phone] entre la 1:00 p. m. y las 3:00 p. m. el lunes 20/06/22.  Recuerde: las instrucciones que no se siguen por completo Heritage manager en un riesgo mdico grave, que puede incluir la [place], o segn el criterio de su cirujano y Environmental health practitioner, es posible que sea Firefighter su Leisure centre manager.    _X__ 1. No ingiera alimentos ni beba lquidos despus de la medianoche anterior a su procedimiento.                    __X__2. En la maana de la ciruga cepllese los dientes con pasta dental y agua, [place] enjuagarse la boca con enjuague bucal si lo desea. No trague ninguna pasta de dientes o enjuague bucal.   _X__ 3. Nada de alcohol por 24 horas antes o despus de la [place].   _X__ 4. No fume ni use cigarrillos electrnicos durante las 24 horas previas a su [place].                 No use ningn producto de tabaco masticable durante al menos 6 horas antes de                 [place].  ____ 5. Traiga todos los medicamentos con usted el da de la ciruga si se lo indicaron.  __X__ 6. Notifique a su mdico si hay algn cambio en su condicin mdica (resfriado, fiebre, infecciones).   No use joyas, maquillaje, horquillas para el cabello, clips o esmalte de uas. No use lociones, talcos ni perfumes. No se afeite 48 horas antes de la [place]. Los hombres pueden [employer] cara y el cuello. No lleve objetos de valor al hospital.  [facility] no es responsable de ninguna pertenencia u objeto de Geographical information systems officer.  No se pueden usar lentes de contacto, dentaduras postizas/parciales o perforaciones corporales en la ciruga. Traiga un estuche para sus lentes de contacto, anteojos o audfonos, se le proporcionar una copa para dentadura postiza. Deja tu maleta en el coche. Despus de la [place], es posible que lo lleven a su habitacin. Para los pacientes ingresados en el hospital, la hora del alta la determina su equipo de tratamient    Los pacientes dados de alta el da de la ciruga no podrn conducir a Holiday representative.  Por favor, lea las siguientes hojas informativas que le dieron: Informacin MRSA, jabn CHG  __X__ [employer] [facility] maana de la ciruga con UN SORBO DE AGUA:  1. comprimido de 20 mg de escitalopram (LEXAPRO) 2. comprimido de 20 mg de famotidina (PEPCID) 3. tableta de 800 MG de gabapentina (NEURONTIN) 4. mononitrato de isosorbida (IMDUR) 30 MG tableta de 24 horas 5. tableta de 34 MCG de  levotiroxina (SYNTHROID, LEVOTHROID) 6. Succinato de metoprolol (TOPROL-XL) 25 MG Tableta de 24 horas 7. tableta de pantoprazol (PROTONIX) 20 MG 8. Puede tomar lorazepam si es necesario  ____ Fleet Enema (segn las indicaciones)  __X__ Use CHG Soap/SAGE toallitas  como se indica  ____ [employer] da de la ciruga  ____ Suspender metformina/Janumet/Farxiga 2 das antes de la ciruga  ____ [employer] mitad de la dosis habitual de insulina la noche anterior a la [place]. Sin insulina por la maana          de [place]  ____ Suspenda los anticoagulantes Coumadin/Plavix/Xarelto/Pleta/Pradaxa/Eliquis/Effient/Aspirin on O comunquese con su cirujano, cardilogo o mdico acerca de la capacidad para Scientist, water quality sus anticoagulantes  __X__ [employer] antiinflamatorios 7 das antes de la ciruga como Advil, Ibuprofen, Motrin, BC o Goodies Powder, Naprosyn, Naproxen, Aleve, Aspirin  __X__ Suspenda todos los suplementos de hierbas, aceite de pescado o vitamina E hasta despus de la [place].  ____ Tressie Ellis al hospital.

## 2020-10-21 ENCOUNTER — Other Ambulatory Visit
Admission: RE | Admit: 2020-10-21 | Discharge: 2020-10-21 | Disposition: A | Discharge status: Home / Self Care | Payer: Medicare (Managed Care) | Source: Ambulatory Visit | Attending: Orthopedic Surgery | Admitting: Orthopedic Surgery

## 2020-10-21 ENCOUNTER — Other Ambulatory Visit: Payer: Self-pay

## 2020-10-21 DIAGNOSIS — Z20822 Contact with and (suspected) exposure to covid-19: Secondary | ICD-10-CM | POA: Diagnosis not present

## 2020-10-21 DIAGNOSIS — Z01812 Encounter for preprocedural laboratory examination: Secondary | ICD-10-CM | POA: Insufficient documentation

## 2020-10-21 LAB — URINE CULTURE: Culture: <10000 — AB

## 2020-10-21 LAB — SARS CORONAVIRUS 2 (TAT 6-24 HRS): SARS Coronavirus 2: NEGATIVE

## 2020-10-25 ENCOUNTER — Other Ambulatory Visit: Payer: Self-pay

## 2020-10-25 ENCOUNTER — Encounter: Payer: Self-pay | Admitting: Orthopedic Surgery

## 2020-10-25 ENCOUNTER — Encounter
Admission: RE | Disposition: A | Discharge status: Skilled Nursing Facility | Payer: Self-pay | Source: Home / Self Care | Attending: Orthopedic Surgery

## 2020-10-25 ENCOUNTER — Inpatient Hospital Stay: Payer: Medicare (Managed Care) | Admitting: Urgent Care

## 2020-10-25 ENCOUNTER — Inpatient Hospital Stay
Admission: RE | Admit: 2020-10-25 | Discharge: 2020-10-27 | DRG: 470 | Disposition: A | Discharge status: Skilled Nursing Facility | Payer: Medicare (Managed Care) | Attending: Orthopedic Surgery | Admitting: Orthopedic Surgery

## 2020-10-25 ENCOUNTER — Inpatient Hospital Stay: Payer: Medicare (Managed Care)

## 2020-10-25 DIAGNOSIS — M8580 Other specified disorders of bone density and structure, unspecified site: Secondary | ICD-10-CM | POA: Diagnosis present

## 2020-10-25 DIAGNOSIS — Z79899 Other long term (current) drug therapy: Secondary | ICD-10-CM | POA: Diagnosis not present

## 2020-10-25 DIAGNOSIS — M329 Systemic lupus erythematosus, unspecified: Secondary | ICD-10-CM | POA: Diagnosis present

## 2020-10-25 DIAGNOSIS — I10 Essential (primary) hypertension: Secondary | ICD-10-CM | POA: Diagnosis present

## 2020-10-25 DIAGNOSIS — M21062 Valgus deformity, not elsewhere classified, left knee: Secondary | ICD-10-CM | POA: Diagnosis present

## 2020-10-25 DIAGNOSIS — Z6841 Body Mass Index (BMI) 40.0 and over, adult: Secondary | ICD-10-CM | POA: Diagnosis not present

## 2020-10-25 DIAGNOSIS — Z79890 Hormone replacement therapy (postmenopausal): Secondary | ICD-10-CM | POA: Diagnosis not present

## 2020-10-25 DIAGNOSIS — G8918 Other acute postprocedural pain: Secondary | ICD-10-CM

## 2020-10-25 DIAGNOSIS — E039 Hypothyroidism, unspecified: Secondary | ICD-10-CM | POA: Diagnosis present

## 2020-10-25 DIAGNOSIS — Z7983 Long term (current) use of bisphosphonates: Secondary | ICD-10-CM | POA: Diagnosis not present

## 2020-10-25 DIAGNOSIS — Z9884 Bariatric surgery status: Secondary | ICD-10-CM

## 2020-10-25 DIAGNOSIS — M1712 Unilateral primary osteoarthritis, left knee: Secondary | ICD-10-CM | POA: Diagnosis present

## 2020-10-25 DIAGNOSIS — Z96652 Presence of left artificial knee joint: Secondary | ICD-10-CM

## 2020-10-25 HISTORY — PX: TOTAL KNEE ARTHROPLASTY: SHX125

## 2020-10-25 LAB — ABO/RH: ABO/RH(D): O POS

## 2020-10-25 LAB — CREATININE, SERUM
Creatinine, Ser: 0.63 mg/dL (ref 0.44–1.00)
GFR, Estimated: >60 mL/min (ref >60)

## 2020-10-25 LAB — CBC
HCT: 42.7 % (ref 36.0–46.0)
Hemoglobin: 13.7 g/dL (ref 12.0–15.0)
MCH: 32.3 pg (ref 26.0–34.0)
MCHC: 32.1 g/dL (ref 30.0–36.0)
MCV: 100.7 fL — ABNORMAL HIGH (ref 80.0–100.0)
Platelets: 163 10*3/uL (ref 150–400)
RBC: 4.24 MIL/uL (ref 3.87–5.11)
RDW: 13.7 % (ref 11.5–15.5)
WBC: 6.9 10*3/uL (ref 4.0–10.5)
nRBC: 0 % (ref 0.0–0.2)

## 2020-10-25 SURGERY — ARTHROPLASTY, KNEE, TOTAL
Anesthesia: General | Site: Knee | Laterality: Left

## 2020-10-25 MED ORDER — ACETAMINOPHEN 10 MG/ML IV SOLN: 1000.0000 mg | Freq: Once | INTRAVENOUS | Status: DC | PRN

## 2020-10-25 MED ORDER — ONDANSETRON HCL 4 MG/2ML IJ SOLN: 4.0000 mg | Freq: Once | INTRAMUSCULAR | Status: DC | PRN

## 2020-10-25 MED ORDER — ROCURONIUM BROMIDE 100 MG/10ML IV SOLN
INTRAVENOUS | Status: DC | PRN
  Administered 2020-10-25: 60 mg via INTRAVENOUS
  Administered 2020-10-25: 20 mg via INTRAVENOUS

## 2020-10-25 MED ORDER — FENTANYL CITRATE (PF) 100 MCG/2ML IJ SOLN
INTRAMUSCULAR | Status: DC | PRN
  Administered 2020-10-25 (×2): 50 ug via INTRAVENOUS

## 2020-10-25 MED ORDER — DIPHENHYDRAMINE HCL 12.5 MG/5ML PO ELIX: 12.5000 mg | ORAL_SOLUTION | ORAL | Status: DC | PRN

## 2020-10-25 MED ORDER — KETAMINE HCL 10 MG/ML IJ SOLN
INTRAMUSCULAR | Status: DC | PRN
  Administered 2020-10-25 (×2): 25 mg via INTRAVENOUS

## 2020-10-25 MED ORDER — SUGAMMADEX SODIUM 200 MG/2ML IV SOLN
INTRAVENOUS | Status: DC | PRN
  Administered 2020-10-25: 200 mg via INTRAVENOUS

## 2020-10-25 MED ORDER — DOCUSATE SODIUM 100 MG PO CAPS
100.0000 mg | ORAL_CAPSULE | Freq: Two times a day (BID) | ORAL | Status: DC
  Administered 2020-10-25 – 2020-10-27 (×4): 100 mg via ORAL
  Filled 2020-10-25 (×4): qty 1

## 2020-10-25 MED ORDER — HYDROMORPHONE HCL 1 MG/ML IJ SOLN
INTRAMUSCULAR | Status: DC | PRN
  Administered 2020-10-25: .25 mg via INTRAVENOUS

## 2020-10-25 MED ORDER — ENOXAPARIN SODIUM 30 MG/0.3ML IJ SOSY
30.0000 mg | PREFILLED_SYRINGE | Freq: Two times a day (BID) | INTRAMUSCULAR | Status: DC
  Administered 2020-10-26 – 2020-10-27 (×3): 30 mg via SUBCUTANEOUS
  Filled 2020-10-25 (×3): qty 0.3

## 2020-10-25 MED ORDER — PANTOPRAZOLE SODIUM 20 MG PO TBEC
20.0000 mg | DELAYED_RELEASE_TABLET | Freq: Every day | ORAL | Status: DC
  Administered 2020-10-26 – 2020-10-27 (×2): 20 mg via ORAL
  Filled 2020-10-25 (×2): qty 1

## 2020-10-25 MED ORDER — PROPOFOL 10 MG/ML IV BOLUS
INTRAVENOUS | Status: AC
  Filled 2020-10-25: qty 20

## 2020-10-25 MED ORDER — MIDAZOLAM HCL 2 MG/2ML IJ SOLN
INTRAMUSCULAR | Status: DC | PRN
  Administered 2020-10-25: 2 mg via INTRAVENOUS

## 2020-10-25 MED ORDER — ZOLPIDEM TARTRATE 5 MG PO TABS: 5.0000 mg | ORAL_TABLET | Freq: Every evening | ORAL | Status: DC | PRN

## 2020-10-25 MED ORDER — METHOCARBAMOL 500 MG PO TABS
500.0000 mg | ORAL_TABLET | Freq: Four times a day (QID) | ORAL | Status: DC | PRN
  Administered 2020-10-27 (×2): 500 mg via ORAL
  Filled 2020-10-25 (×3): qty 1

## 2020-10-25 MED ORDER — MIDAZOLAM HCL 2 MG/2ML IJ SOLN
INTRAMUSCULAR | Status: AC
  Filled 2020-10-25: qty 2

## 2020-10-25 MED ORDER — MORPHINE SULFATE (PF) 2 MG/ML IV SOLN: 0.5000 mg | INTRAVENOUS | Status: DC | PRN

## 2020-10-25 MED ORDER — FENTANYL CITRATE (PF) 100 MCG/2ML IJ SOLN
INTRAMUSCULAR | Status: AC
  Filled 2020-10-25: qty 2

## 2020-10-25 MED ORDER — CEFAZOLIN SODIUM-DEXTROSE 2-4 GM/100ML-% IV SOLN
INTRAVENOUS | Status: AC
  Filled 2020-10-25: qty 100

## 2020-10-25 MED ORDER — HYDROCODONE-ACETAMINOPHEN 7.5-325 MG PO TABS
1.0000 | ORAL_TABLET | ORAL | Status: DC | PRN
  Administered 2020-10-26 (×2): 2 via ORAL
  Filled 2020-10-25 (×2): qty 2

## 2020-10-25 MED ORDER — FENTANYL CITRATE (PF) 100 MCG/2ML IJ SOLN
25.0000 ug | INTRAMUSCULAR | Status: DC | PRN
  Administered 2020-10-25 (×2): 50 ug via INTRAVENOUS

## 2020-10-25 MED ORDER — BUPIVACAINE LIPOSOME 1.3 % IJ SUSP
INTRAMUSCULAR | Status: AC
  Filled 2020-10-25: qty 20

## 2020-10-25 MED ORDER — BUPIVACAINE-EPINEPHRINE (PF) 0.25% -1:200000 IJ SOLN
INTRAMUSCULAR | Status: DC | PRN
  Administered 2020-10-25: 30 mL

## 2020-10-25 MED ORDER — ALUM & MAG HYDROXIDE-SIMETH 200-200-20 MG/5ML PO SUSP: 30.0000 mL | ORAL | Status: DC | PRN

## 2020-10-25 MED ORDER — SODIUM CHLORIDE 0.9 % IV SOLN: INTRAVENOUS | Status: DC

## 2020-10-25 MED ORDER — ACETAMINOPHEN 325 MG PO TABS
325.0000 mg | ORAL_TABLET | Freq: Four times a day (QID) | ORAL | Status: DC | PRN
  Administered 2020-10-27: 650 mg via ORAL
  Filled 2020-10-25: qty 2

## 2020-10-25 MED ORDER — OXYCODONE HCL 5 MG/5ML PO SOLN: 5.0000 mg | Freq: Once | ORAL | Status: DC | PRN

## 2020-10-25 MED ORDER — CHLORHEXIDINE GLUCONATE 0.12 % MT SOLN: 15.0000 mL | Freq: Once | OROMUCOSAL | Status: AC

## 2020-10-25 MED ORDER — GABAPENTIN 400 MG PO CAPS
800.0000 mg | ORAL_CAPSULE | Freq: Two times a day (BID) | ORAL | Status: DC
  Administered 2020-10-26 – 2020-10-27 (×4): 800 mg via ORAL
  Filled 2020-10-25 (×4): qty 2

## 2020-10-25 MED ORDER — DEXAMETHASONE SODIUM PHOSPHATE 10 MG/ML IJ SOLN
INTRAMUSCULAR | Status: DC | PRN
  Administered 2020-10-25: 10 mg via INTRAVENOUS

## 2020-10-25 MED ORDER — HYDROXYCHLOROQUINE SULFATE 200 MG PO TABS
200.0000 mg | ORAL_TABLET | Freq: Two times a day (BID) | ORAL | Status: DC
  Administered 2020-10-25 – 2020-10-27 (×4): 200 mg via ORAL
  Filled 2020-10-25 (×4): qty 1

## 2020-10-25 MED ORDER — MORPHINE SULFATE (PF) 10 MG/ML IV SOLN
INTRAVENOUS | Status: DC | PRN
  Administered 2020-10-25: 10 mL

## 2020-10-25 MED ORDER — ACETAMINOPHEN 10 MG/ML IV SOLN
INTRAVENOUS | Status: DC | PRN
  Administered 2020-10-25: 1000 mg via INTRAVENOUS

## 2020-10-25 MED ORDER — OLANZAPINE 5 MG PO TABS
15.0000 mg | ORAL_TABLET | Freq: Every day | ORAL | Status: DC
  Administered 2020-10-25 – 2020-10-26 (×2): 15 mg via ORAL
  Filled 2020-10-25 (×2): qty 3

## 2020-10-25 MED ORDER — METOPROLOL SUCCINATE ER 25 MG PO TB24
25.0000 mg | ORAL_TABLET | Freq: Every day | ORAL | Status: DC
  Administered 2020-10-26: 25 mg via ORAL
  Filled 2020-10-25 (×2): qty 1

## 2020-10-25 MED ORDER — OXYCODONE HCL 5 MG PO TABS: 5.0000 mg | ORAL_TABLET | Freq: Once | ORAL | Status: DC | PRN

## 2020-10-25 MED ORDER — FAMOTIDINE 20 MG PO TABS
20.0000 mg | ORAL_TABLET | Freq: Two times a day (BID) | ORAL | Status: DC
  Administered 2020-10-25 – 2020-10-27 (×4): 20 mg via ORAL
  Filled 2020-10-25 (×4): qty 1

## 2020-10-25 MED ORDER — CEFAZOLIN SODIUM-DEXTROSE 2-4 GM/100ML-% IV SOLN
2.0000 g | Freq: Four times a day (QID) | INTRAVENOUS | Status: AC
  Administered 2020-10-25 (×2): 2 g via INTRAVENOUS
  Filled 2020-10-25 (×2): qty 100

## 2020-10-25 MED ORDER — ONDANSETRON HCL 4 MG/2ML IJ SOLN
INTRAMUSCULAR | Status: DC | PRN
  Administered 2020-10-25: 4 mg via INTRAVENOUS

## 2020-10-25 MED ORDER — MENTHOL 3 MG MT LOZG
1.0000 | LOZENGE | OROMUCOSAL | Status: DC | PRN
  Filled 2020-10-25: qty 9

## 2020-10-25 MED ORDER — HYDROMORPHONE HCL 1 MG/ML IJ SOLN
INTRAMUSCULAR | Status: AC
  Filled 2020-10-25: qty 1

## 2020-10-25 MED ORDER — ONDANSETRON HCL 4 MG/2ML IJ SOLN: 4.0000 mg | Freq: Four times a day (QID) | INTRAMUSCULAR | Status: DC | PRN

## 2020-10-25 MED ORDER — ORAL CARE MOUTH RINSE: 15.0000 mL | Freq: Once | OROMUCOSAL | Status: AC

## 2020-10-25 MED ORDER — BISACODYL 10 MG RE SUPP: 10.0000 mg | Freq: Every day | RECTAL | Status: DC | PRN

## 2020-10-25 MED ORDER — TRAMADOL HCL 50 MG PO TABS
50.0000 mg | ORAL_TABLET | Freq: Four times a day (QID) | ORAL | Status: DC
  Administered 2020-10-26 – 2020-10-27 (×7): 50 mg via ORAL
  Filled 2020-10-25 (×8): qty 1

## 2020-10-25 MED ORDER — POLYETHYLENE GLYCOL 3350 17 G PO PACK: 17.0000 g | PACK | Freq: Every day | ORAL | Status: DC | PRN

## 2020-10-25 MED ORDER — METHOCARBAMOL 1000 MG/10ML IJ SOLN
500.0000 mg | Freq: Four times a day (QID) | INTRAVENOUS | Status: DC | PRN
  Filled 2020-10-25: qty 5

## 2020-10-25 MED ORDER — ISOSORBIDE MONONITRATE ER 30 MG PO TB24
30.0000 mg | ORAL_TABLET | Freq: Every day | ORAL | Status: DC
  Administered 2020-10-27: 30 mg via ORAL
  Filled 2020-10-25 (×2): qty 1

## 2020-10-25 MED ORDER — DEXMEDETOMIDINE (PRECEDEX) IN NS 20 MCG/5ML (4 MCG/ML) IV SYRINGE
PREFILLED_SYRINGE | INTRAVENOUS | Status: DC | PRN
  Administered 2020-10-25: 8 ug via INTRAVENOUS
  Administered 2020-10-25: 12 ug via INTRAVENOUS

## 2020-10-25 MED ORDER — SODIUM CHLORIDE 0.9 % IV SOLN
INTRAVENOUS | Status: DC | PRN
  Administered 2020-10-25: 60 mL

## 2020-10-25 MED ORDER — PHENYLEPHRINE HCL (PRESSORS) 10 MG/ML IV SOLN
INTRAVENOUS | Status: AC
  Filled 2020-10-25: qty 1

## 2020-10-25 MED ORDER — GABAPENTIN 800 MG PO TABS
800.0000 mg | ORAL_TABLET | Freq: Two times a day (BID) | ORAL | Status: DC
  Filled 2020-10-25: qty 1

## 2020-10-25 MED ORDER — SODIUM CHLORIDE FLUSH 0.9 % IV SOLN
INTRAVENOUS | Status: AC
  Filled 2020-10-25: qty 40

## 2020-10-25 MED ORDER — LIDOCAINE HCL (CARDIAC) PF 100 MG/5ML IV SOSY
PREFILLED_SYRINGE | INTRAVENOUS | Status: DC | PRN
  Administered 2020-10-25: 100 mg via INTRAVENOUS

## 2020-10-25 MED ORDER — PHENOL 1.4 % MT LIQD
1.0000 | OROMUCOSAL | Status: DC | PRN
  Filled 2020-10-25: qty 177

## 2020-10-25 MED ORDER — ONDANSETRON HCL 4 MG PO TABS: 4.0000 mg | ORAL_TABLET | Freq: Four times a day (QID) | ORAL | Status: DC | PRN

## 2020-10-25 MED ORDER — ESCITALOPRAM OXALATE 20 MG PO TABS
20.0000 mg | ORAL_TABLET | Freq: Every day | ORAL | Status: DC
  Administered 2020-10-26 – 2020-10-27 (×2): 20 mg via ORAL
  Filled 2020-10-25 (×2): qty 1

## 2020-10-25 MED ORDER — FOLIC ACID 1 MG PO TABS
1.0000 mg | ORAL_TABLET | Freq: Every day | ORAL | Status: DC
  Administered 2020-10-26 – 2020-10-27 (×2): 1 mg via ORAL
  Filled 2020-10-25 (×2): qty 1

## 2020-10-25 MED ORDER — MORPHINE SULFATE (PF) 10 MG/ML IV SOLN
INTRAVENOUS | Status: AC
  Filled 2020-10-25: qty 1

## 2020-10-25 MED ORDER — PROPOFOL 10 MG/ML IV BOLUS
INTRAVENOUS | Status: DC | PRN
  Administered 2020-10-25: 100 mg via INTRAVENOUS

## 2020-10-25 MED ORDER — HYDROCODONE-ACETAMINOPHEN 5-325 MG PO TABS
1.0000 | ORAL_TABLET | ORAL | Status: DC | PRN
  Administered 2020-10-25: 1 via ORAL
  Administered 2020-10-25: 2 via ORAL
  Administered 2020-10-26 – 2020-10-27 (×3): 1 via ORAL
  Filled 2020-10-25 (×2): qty 1
  Filled 2020-10-25: qty 2
  Filled 2020-10-25 (×2): qty 1

## 2020-10-25 MED ORDER — FERROUS SULFATE 325 (65 FE) MG PO TABS
325.0000 mg | ORAL_TABLET | Freq: Every day | ORAL | Status: DC
  Administered 2020-10-26 – 2020-10-27 (×2): 325 mg via ORAL
  Filled 2020-10-25 (×2): qty 1

## 2020-10-25 MED ORDER — ACETAMINOPHEN 10 MG/ML IV SOLN
INTRAVENOUS | Status: AC
  Filled 2020-10-25: qty 100

## 2020-10-25 MED ORDER — LORAZEPAM 0.5 MG PO TABS
0.5000 mg | ORAL_TABLET | Freq: Every day | ORAL | Status: DC
  Administered 2020-10-25 – 2020-10-26 (×2): 0.5 mg via ORAL
  Filled 2020-10-25 (×2): qty 1

## 2020-10-25 MED ORDER — CHLORHEXIDINE GLUCONATE 0.12 % MT SOLN
OROMUCOSAL | Status: AC
  Administered 2020-10-25: 15 mL via OROMUCOSAL
  Filled 2020-10-25: qty 15

## 2020-10-25 MED ORDER — TRAZODONE HCL 50 MG PO TABS
125.0000 mg | ORAL_TABLET | Freq: Every day | ORAL | Status: DC
  Administered 2020-10-25 – 2020-10-26 (×2): 125 mg via ORAL
  Filled 2020-10-25 (×2): qty 1

## 2020-10-25 MED ORDER — CEFAZOLIN SODIUM-DEXTROSE 2-4 GM/100ML-% IV SOLN
2.0000 g | INTRAVENOUS | Status: AC
  Administered 2020-10-25: 2 g via INTRAVENOUS

## 2020-10-25 MED ORDER — BUPIVACAINE-EPINEPHRINE (PF) 0.25% -1:200000 IJ SOLN
INTRAMUSCULAR | Status: AC
  Filled 2020-10-25: qty 30

## 2020-10-25 MED ORDER — LACTATED RINGERS IV SOLN: INTRAVENOUS | Status: DC

## 2020-10-25 MED ORDER — LEVOTHYROXINE SODIUM 25 MCG PO TABS
75.0000 ug | ORAL_TABLET | Freq: Every day | ORAL | Status: DC
  Administered 2020-10-26 – 2020-10-27 (×2): 75 ug via ORAL
  Filled 2020-10-25 (×2): qty 1

## 2020-10-25 SURGICAL SUPPLY — 71 items
BLADE SAGITTAL 25.0X1.19X90 (BLADE) ×2 IMPLANT
BLADE SAW 90X13X1.19 OSCILLAT (BLADE) ×2 IMPLANT
BLOCK CUTTING FEMUR 3+ LT MED (MISCELLANEOUS) ×2 IMPLANT
BLOCK CUTTING TIBIAL 2 LT (MISCELLANEOUS) ×2 IMPLANT
BNDG ELASTIC 6X5.8 VLCR STR LF (GAUZE/BANDAGES/DRESSINGS) ×2 IMPLANT
CANISTER SUCT 1200ML W/VALVE (MISCELLANEOUS) ×2 IMPLANT
CANISTER WOUND CARE 500ML ATS (WOUND CARE) ×2 IMPLANT
CEMENT HV SMART SET (Cement) ×4 IMPLANT
CEMENT PATELLA RESURF SZ1 (Cement) ×2 IMPLANT
CHLORAPREP W/TINT 26 (MISCELLANEOUS) ×4 IMPLANT
COOLER POLAR GLACIER W/PUMP (MISCELLANEOUS) ×2 IMPLANT
COVER WAND RF STERILE (DRAPES) ×2 IMPLANT
CUFF TOURN SGL QUICK 24 (TOURNIQUET CUFF)
CUFF TOURN SGL QUICK 34 (TOURNIQUET CUFF)
CUFF TRNQT CYL 24X4X16.5-23 (TOURNIQUET CUFF) IMPLANT
CUFF TRNQT CYL 34X4.125X (TOURNIQUET CUFF) IMPLANT
DRAPE 3/4 80X56 (DRAPES) ×4 IMPLANT
DRSG MEPILEX SACRM 8.7X9.8 (GAUZE/BANDAGES/DRESSINGS) ×2 IMPLANT
ELECT CAUTERY BLADE 6.4 (BLADE) ×2 IMPLANT
ELECT REM PT RETURN 9FT ADLT (ELECTROSURGICAL) ×2
ELECTRODE REM PT RTRN 9FT ADLT (ELECTROSURGICAL) ×1 IMPLANT
FEM COMP SZ3 LFT (Joint) ×2 IMPLANT
FEMUR BONE MODEL 4.9010 MEDACT (MISCELLANEOUS) ×2 IMPLANT
GAUZE SPONGE 4X4 12PLY STRL (GAUZE/BANDAGES/DRESSINGS) ×2 IMPLANT
GAUZE XEROFORM 1X8 LF (GAUZE/BANDAGES/DRESSINGS) ×2 IMPLANT
GLOVE SURG ORTHO LTX SZ8 (GLOVE) ×2 IMPLANT
GLOVE SURG SYN 9.0  PF PI (GLOVE) ×1
GLOVE SURG SYN 9.0 PF PI (GLOVE) ×1 IMPLANT
GLOVE SURG UNDER LTX SZ8 (GLOVE) ×2 IMPLANT
GLOVE SURG UNDER POLY LF SZ9 (GLOVE) ×2 IMPLANT
GOWN SRG 2XL LVL 4 RGLN SLV (GOWNS) ×1 IMPLANT
GOWN STRL NON-REIN 2XL LVL4 (GOWNS) ×1
GOWN STRL REUS W/ TWL LRG LVL3 (GOWN DISPOSABLE) ×1 IMPLANT
GOWN STRL REUS W/ TWL XL LVL3 (GOWN DISPOSABLE) ×1 IMPLANT
GOWN STRL REUS W/TWL LRG LVL3 (GOWN DISPOSABLE) ×1
GOWN STRL REUS W/TWL XL LVL3 (GOWN DISPOSABLE) ×1
HOLDER FOLEY CATH W/STRAP (MISCELLANEOUS) ×2 IMPLANT
HOOD PEEL AWAY FLYTE STAYCOOL (MISCELLANEOUS) ×4 IMPLANT
IRRIGATION SURGIPHOR STRL (IV SOLUTION) IMPLANT
IV NS IRRIG 3000ML ARTHROMATIC (IV SOLUTION) ×2 IMPLANT
KIT PREVENA INCISION MGT20CM45 (CANNISTER) ×2 IMPLANT
KIT TURNOVER KIT A (KITS) ×2 IMPLANT
MANIFOLD NEPTUNE II (INSTRUMENTS) ×2 IMPLANT
NDL SAFETY ECLIPSE 18X1.5 (NEEDLE) ×1 IMPLANT
NEEDLE HYPO 18GX1.5 SHARP (NEEDLE) ×1
NEEDLE SPNL 18GX3.5 QUINCKE PK (NEEDLE) ×2 IMPLANT
NEEDLE SPNL 20GX3.5 QUINCKE YW (NEEDLE) ×2 IMPLANT
NS IRRIG 1000ML POUR BTL (IV SOLUTION) ×2 IMPLANT
PACK TOTAL KNEE (MISCELLANEOUS) ×2 IMPLANT
PAD WRAPON POLAR KNEE (MISCELLANEOUS) ×1 IMPLANT
PENCIL SMOKE EVACUATOR COATED (MISCELLANEOUS) ×2 IMPLANT
PULSAVAC PLUS IRRIG FAN TIP (DISPOSABLE) ×2
SCALPEL PROTECTED #10 DISP (BLADE) ×4 IMPLANT
STAPLER SKIN PROX 35W (STAPLE) ×2 IMPLANT
STEM EXTENSION 11MMX30MM (Stem) ×2 IMPLANT
SUCTION FRAZIER HANDLE 10FR (MISCELLANEOUS) ×1
SUCTION TUBE FRAZIER 10FR DISP (MISCELLANEOUS) ×1 IMPLANT
SUT DVC 2 QUILL PDO  T11 36X36 (SUTURE) ×1
SUT DVC 2 QUILL PDO T11 36X36 (SUTURE) ×1 IMPLANT
SUT ETHIBOND 2 V 37 (SUTURE) IMPLANT
SUT V-LOC 90 ABS DVC 3-0 CL (SUTURE) ×2 IMPLANT
SYR 20ML LL LF (SYRINGE) ×2 IMPLANT
SYR 50ML LL SCALE MARK (SYRINGE) ×4 IMPLANT
TIB INSERT FIXED SZ2 14MM 0207 (Insert) ×2 IMPLANT
TIBIAL BONE MODEL LEFT (MISCELLANEOUS) ×2 IMPLANT
TIBIAL TRAY FIXED MEDACTA 0207 (Joint) ×2 IMPLANT
TIP FAN IRRIG PULSAVAC PLUS (DISPOSABLE) ×1 IMPLANT
TOWEL OR 17X26 4PK STRL BLUE (TOWEL DISPOSABLE) ×2 IMPLANT
TOWER CARTRIDGE SMART MIX (DISPOSABLE) ×2 IMPLANT
TRAY FOLEY MTR SLVR 16FR STAT (SET/KITS/TRAYS/PACK) ×2 IMPLANT
WRAPON POLAR PAD KNEE (MISCELLANEOUS) ×2

## 2020-10-25 NOTE — Anesthesia Preprocedure Evaluation (Addendum)
Anesthesia Evaluation  Patient identified by MRN, date of birth, ID band Patient awake  General Assessment Comment:Patient appears to be poor historian and denies many of the medical issues that are listed in her chart  Patient says she had a problem with either spinal or epidural anesthesia 15 years ago after bariatric surgery; she says she had prolonged back pain, headaches, and bruising at the site  Reviewed: Allergy & Precautions, NPO status , Patient's Chart, lab work & pertinent test results  History of Anesthesia Complications (+) POST - OP SPINAL HEADACHE and history of anesthetic complications  Airway Mallampati: III  TM Distance: >3 FB Neck ROM: Full    Dental  (+) Poor Dentition, Missing   Pulmonary sleep apnea , COPD, Patient abstained from smoking.Not current smoker,    Pulmonary exam normal breath sounds clear to auscultation       Cardiovascular Exercise Tolerance: Good METShypertension, (-) CAD and (-) Past MI (-) dysrhythmias  Rhythm:Regular Rate:Normal - Systolic murmurs TTE 5009: INTERPRETATION  NORMAL LEFT VENTRICULAR SYSTOLIC FUNCTION  WITH MILD LVH  NORMAL RIGHT VENTRICULAR SYSTOLIC FUNCTION  MILD VALVULAR REGURGITATION (See above)  NO VALVULAR STENOSIS  MILD MR, TR  EF 55-60%     Neuro/Psych PSYCHIATRIC DISORDERS Depression  Neuromuscular disease    GI/Hepatic GERD  ,(+)     (-) substance abuse  ,   Endo/Other  neg diabetesHypothyroidism Morbid obesity  Renal/GU negative Renal ROS     Musculoskeletal   Abdominal (+) + obese,   Peds  Hematology   Anesthesia Other Findings Past Medical History: No date: Arthritis No date: Collagen vascular disease (HCC) No date: COPD (chronic obstructive pulmonary disease) ([place]) 05/14/2016: DDD (degenerative disc disease), lumbar No date: Depression No date: Dizziness No date: Dyspnea No date: GERD (gastroesophageal reflux disease) No date: Heart  valve problem     Comment:  per grandaughter need surgery but they told her may not               survive; No date: History of kidney stones No date: Hypertension No date: Hypothyroidism No date: Lupus ([place]) No date: Neuromuscular disorder ([place]) No date: Sleep apnea     Comment:  no CPAP No date: Thyroid disease 05/14/2016: Vitamin D deficiency  Reproductive/Obstetrics                            Anesthesia Physical Anesthesia Plan  ASA: 3  Anesthesia Plan: General   Post-op Pain Management:    Induction: Intravenous  PONV Risk Score and Plan: 4 or greater and Ondansetron, Dexamethasone and Treatment may vary due to age or medical condition  Airway Management Planned: Oral ETT  Additional Equipment: None  Intra-op Plan:   Post-operative Plan: Extubation in OR  Informed Consent: I have reviewed the patients History and Physical, chart, labs and discussed the procedure including the risks, benefits and alternatives for the proposed anesthesia with the patient or authorized representative who has indicated his/her understanding and acceptance.     Dental advisory given and Interpreter used for interveiw (in-person spanish interpreter at bedside)  Plan Discussed with: CRNA and Surgeon  Anesthesia Plan Comments: (Patient refused spinal anesthesia due to her poor prior experience. Discussed risks of general anesthesia with patient, including PONV, sore throat, lip/dental damage. Rare risks discussed as well, such as cardiorespiratory and neurological sequelae. Patient understands. Patient informed about increased incidence of above perioperative risk due to high BMI. Patient understands. )  Anesthesia Quick Evaluation  

## 2020-10-25 NOTE — Op Note (Signed)
10/25/2020  12:23 PM  PATIENT:  Shelly Freeman   MRN: [id number]  PRE-OPERATIVE DIAGNOSIS:  Primary localized osteoarthritis of left knee   POST-OPERATIVE DIAGNOSIS:  Same   PROCEDURE:  Procedure(s): Left TOTAL KNEE ARTHROPLASTY   SURGEON: Laurene Footman, MD   ASSISTANTS: Rachelle Hora, PA-C   ANESTHESIA:   general   EBL:     BLOOD ADMINISTERED:none   DRAINS:  Incisional wound VAC     LOCAL MEDICATIONS USED:  MARCAINE    and OTHER morphine and Exparel   SPECIMEN:  No Specimen   DISPOSITION OF SPECIMEN:  N/A   COUNTS:  YES   TOURNIQUET: 61 minutes at 300 mm Hg   IMPLANTS: Medacta  GMK  system with 3D PS femur, 2 left tibia with short stem and 14 PS mm insert.  Size 1 patella, all components cemented.   DICTATION: Viviann Spare Dictation   patient was brought to the operating room and general anesthesia was obtained.  After prepping and draping the left leg in sterile fashion, and after patient identification and timeout procedures were completed, tourniquet was raised  and midline skin incision was made followed by medial parapatellar arthrotomy with moderate medial compartment osteoarthritis, severe patellofemoral arthritis and severe lateral compartment arthritis, partial synovectomy was also carried out.   The ACL and PCL and fat pad were excised along with anterior horns of the meniscus. The proximal tibia cutting guide from  the [facility] system was applied and the proximal tibia cut carried out.  The distal femoral cut was carried out in a similar fashion     The 3 femoral cutting guide applied with anterior posterior and chamfer cuts made.  The posterior horns of the menisci were removed at this point.   Injection of the above medication was carried out after the femoral and tibial cuts were carried out.  The 2 baseplate trial was placed pinned into position and proximal tibial preparation carried out with drilling hand reaming and the keel punch followed by placement of the 3  femur and sizing the tibial insert size 14 millimeter gave the best fit with stability and full extension.  The distal femoral drill holes were made in the notch cut for the trochlear groove was then carried out with trials were then removed the patella was cut using the patellar cutting guide and it sized to a size 1 after drill holes have been made  The knee was irrigated with pulsatile lavage and the bony surfaces dried the tibial component was cemented into place first.  Excess cement was removed and the polyethylene insert placed with a torque screw placed with a torque screwdriver tightened.  The distal femoral component was placed and the knee was held in extension as the patellar button was clamped into place.  After the cement was set, excess cement was removed and the knee was again irrigated thoroughly thoroughly irrigated.  The tourniquet was let down and hemostasis checked with electrocautery. The arthrotomy was repaired with a heavy Quill suture,  followed by 3-0 V lock subcuticular closure, skin staples followed by incisional wound VAC and Polar Care.[name]   PLAN OF CARE: Admit to inpatient    PATIENT DISPOSITION:  PACU - hemodynamically stable.

## 2020-10-25 NOTE — Transfer of Care (Signed)
Immediate Anesthesia Transfer of Care Note  Patient: Shelly Freeman  Procedure(s) Performed: TOTAL KNEE ARTHROPLASTY (Left: Knee)  Patient Location: PACU  Anesthesia Type:General  Level of Consciousness: awake and drowsy  Airway & Oxygen Therapy: Patient Spontanous Breathing and Patient connected to face mask oxygen  Post-op Assessment: Report given to RN and Post -op Vital signs reviewed and stable  Post vital signs: stable  Last Vitals:  Vitals Value Taken Time  BP 101/79 10/25/20 1219  Temp    Pulse 62 10/25/20 1222  Resp 18 10/25/20 1222  SpO2 100 % 10/25/20 1222  Vitals shown include unvalidated device data.  Last Pain:  Vitals:   10/25/20 0728  TempSrc: Oral  PainSc: 8          Complications: No notable events documented.

## 2020-10-25 NOTE — Evaluation (Signed)
Physical Therapy Evaluation Patient Details Name: Shelly Freeman MRN: [id number] DOB: March 26, 1951 Today's Date: 10/25/2020   History of Present Illness  Patient is a 70 year old female with primary localized osteoarthritis of left knee s/p TKA. Past medical history significant for arthritis, systemic lupus erythematosus, neuromuscular disorder, HTN, gastric bypass surgery, CKD,  Clinical Impression  [place] interpreter services used during evaluation, ID # [id number]. Patient reports she has chronic pain at baseline and ambulates with a 2 wheeled walker. She also reports she lives alone and has had "many" falls in the past 6 months.  Patient currently complains of 8/10 pain in left knee. Pain was worse with mobility and patient only able to make it to partial sitting position before requesting to return to bed. She requires assistance and encouragement with mobility efforts. Patient states she is planning to be discharge to rehab which is appropriate given current mobility status as well as patient lives at home alone. Recommend to continue PT to maximize independence and facilitate return to prior level of function.     Follow Up Recommendations SNF    Equipment Recommendations  None recommended by PT    Recommendations for Other Services       Precautions / Restrictions Precautions Precautions: Knee Precaution Booklet Issued: Yes (comment) Precaution Comments: handout provided in Spanish Restrictions Weight Bearing Restrictions: Yes LLE Weight Bearing: Weight bearing as tolerated      Mobility  Bed Mobility Overal bed mobility: Needs Assistance Bed Mobility: Supine to Sit;Sit to Supine     Supine to sit: Max assist (to achieve partial sitting position) Sit to supine: Max assist (from partial sitting position)   General bed mobility comments: patient achieved partial sitting position briefly and requested to return to bed due to pain in left knee with movement. verbal and visual  cues for technique. patient required extra time to complete tasks.    Transfers                 General transfer comment: unable to attempt due to poor sitting tolerance  Ambulation/Gait                Stairs            Wheelchair Mobility    Modified Rankin (Stroke Patients Only)       Balance Overall balance assessment: History of Falls                                           Pertinent Vitals/Pain Pain Assessment: 0-10 Pain Score: 8  Pain Location: left knee Pain Descriptors / Indicators: Grimacing;Guarding Pain Intervention(s): Limited activity within patient's tolerance;Premedicated before session (polar care re-applied at end of session)    Home Living Family/patient expects to be discharged to:: Private residence Living Arrangements: Alone   Type of Home: Apartment Home Access: Level entry     Home Layout: One level Home Equipment: Walker - 2 wheels      Prior Function Level of Independence: Independent with assistive device(s)         Comments: Patient reports she uses a 2 wheeled walker for ambulation and independent with ADLs at baseline. She reports she does not drive and her friends provide her with a ride to the doctor if needed. Patient reports "many" falls in the past 6 months     Hand Dominance  Extremity/Trunk Assessment   Upper Extremity Assessment Upper Extremity Assessment: Generalized weakness    Lower Extremity Assessment Lower Extremity Assessment: LLE deficits/detail (patient also reports chronic right knee pain and multiple falls in the past 6 months) LLE Deficits / Details: patient able to perform SLR with AAROM x 5 reps. guarding with all movement and questionable if patient provided full effort with mobility LLE: Unable to fully assess due to pain LLE Sensation: WNL       Communication   Communication: Interpreter utilized  Cognition Arousal/Alertness: Awake/alert Behavior  During Therapy: WFL for tasks assessed/performed Overall Cognitive Status: Within Functional Limits for tasks assessed                                 General Comments: patient able to follow single step commands with extra time      General Comments      Exercises Total Joint Exercises Ankle Circles/Pumps: AROM;Strengthening;Left;10 reps;Supine Heel Slides: AAROM;Strengthening;Left;Supine (x 3 reps) Straight Leg Raises: AAROM;Strengthening;Left;5 reps;Supine Goniometric ROM: left knee 8-53 degrees (limited by pain, operative site guarding)   Assessment/Plan    PT Assessment Patient needs continued PT services  PT Problem List Decreased strength;Decreased range of motion;Decreased activity tolerance;Decreased balance;Decreased mobility;Decreased knowledge of use of DME;Decreased safety awareness;Pain       PT Treatment Interventions DME instruction;Gait training;Stair training;Functional mobility training;Therapeutic activities;Therapeutic exercise;Balance training;Neuromuscular re-education;Patient/family education    PT Goals (Current goals can be found in the Care Plan section)  Acute Rehab PT Goals Patient Stated Goal: to go to rehab PT Goal Formulation: With patient Time For Goal Achievement: 11/08/20 Potential to Achieve Goals: Fair Additional Goals Additional Goal #1: patient will increase left active knee flexion to 90 degrees for improved gait pattern and independence with activity    Frequency BID   Barriers to discharge Decreased caregiver support      Co-evaluation               AM-PAC PT "6 Clicks" Mobility  Outcome Measure Help needed turning from your back to your side while in a flat bed without using bedrails?: A Lot Help needed moving from lying on your back to sitting on the side of a flat bed without using bedrails?: A Lot Help needed moving to and from a bed to a chair (including a wheelchair)?: A Lot Help needed standing up  from a chair using your arms (e.g., wheelchair or bedside chair)?: A Lot Help needed to walk in hospital room?: A Lot Help needed climbing 3-5 steps with a railing? : A Lot 6 Click Score: 12    End of Session   Activity Tolerance: Patient limited by pain Patient left: in bed;with call bell/phone within reach;with bed alarm set Nurse Communication: Mobility status PT Visit Diagnosis: Pain;Difficulty in walking, not elsewhere classified (R26.2);Muscle weakness (generalized) (M62.81) Pain - Right/Left: Left Pain - part of body: Knee    Time: 1791-5056 PT Time Calculation (min) (ACUTE ONLY): 30 min   Charges:   PT Evaluation $PT Eval Moderate Complexity: 1 Mod PT Treatments $Therapeutic Exercise: 8-22 mins        Minna Merritts, PT, MPT   [name] 10/25/2020, 3:37 PM

## 2020-10-25 NOTE — Anesthesia Postprocedure Evaluation (Signed)
Anesthesia Post Note  Patient: Shelly Freeman  Procedure(s) Performed: TOTAL KNEE ARTHROPLASTY (Left: Knee)  Patient location during evaluation: PACU Anesthesia Type: General Level of consciousness: awake and alert Pain management: pain level controlled Vital Signs Assessment: post-procedure vital signs reviewed and stable Respiratory status: spontaneous breathing, nonlabored ventilation, respiratory function stable and patient connected to nasal cannula oxygen Cardiovascular status: blood pressure returned to baseline and stable Postop Assessment: no apparent nausea or vomiting Anesthetic complications: no   No notable events documented.   Last Vitals:  Vitals:   10/25/20 1251 10/25/20 1257  BP:    Pulse: 63 63  Resp: 10 16  Temp:    SpO2: 97% 92%    Last Pain:  Vitals:   10/25/20 1257  TempSrc:   PainSc: Asleep                 Arita Miss

## 2020-10-25 NOTE — Anesthesia Procedure Notes (Signed)
Procedure Name: Intubation Date/Time: 10/25/2020 10:29 AM Performed by: Biagio Borg, CRNA Pre-anesthesia Checklist: Patient identified, Emergency Drugs available, Suction available and Patient being monitored Patient Re-evaluated:Patient Re-evaluated prior to induction Oxygen Delivery Method: Circle system utilized Preoxygenation: Pre-oxygenation with 100% oxygen Induction Type: IV induction Ventilation: Mask ventilation without difficulty Laryngoscope Size: McGraph and 4 Grade View: Grade I Tube type: Oral Number of attempts: 1 Airway Equipment and Method: Stylet Placement Confirmation: ETT inserted through vocal cords under direct vision, positive ETCO2 and breath sounds checked- equal and bilateral Secured at: 21 cm Tube secured with: Tape Dental Injury: Teeth and Oropharynx as per pre-operative assessment

## 2020-10-25 NOTE — H&P (Signed)
Chief Complaint  Patient presents with   Pre-op Exam  Left TKA scheduled 10/25/20 with Dr. Brett Canales Shelly Freeman is a 70 y.o. female who presents today for history and physical for left total knee arthroplasty with Dr. Hessie Knows on 10/25/2020. Patient has x-ray showing severe degenerative changes lateral compartment with valgus deformity. Complete loss of joint space and lateral compartment. Pain is been progressing over the last few years. She received a cortisone injection October 2021 with right at 3 months worth relief. Cortisone injections not giving her as much relief as they used to. She is having increasing instability of the knee that is affecting her ability to perform activities of daily living. Pain is severe and located along the lateral joint line. She has daily swelling. She has taken Tylenol with no relief. She is having to walk with a walker. She would like to proceed with left total knee arthroplasty. Risks, benefits, complications of the left total knee have been discussed with patient. Patient has agreed and consent procedure Dr. Hessie Knows on 10/25/2020.  Past Medical History: Past Medical History:  Diagnosis Date   Arthritis   Bronchitis 05/2017   Chronic kidney disease  Stones   GERD (gastroesophageal reflux disease)  Almost a year   Heart disease   Hypertension   Neuromuscular disorder (CMS-HCC)   Systemic lupus erythematosus (CMS-HCC)   Thyroid disease   Past Surgical History: Past Surgical History:  Procedure Laterality Date   APPENDECTOMY   CESAREAN SECTION   COLONOSCOPY 2010  Colon Polyps per pt   COLONOSCOPY 07/01/2017  PH Colon Polyps in past: [facility] 06/2022   EGD 07/01/2017  No repeat per RTE   GASTRIC BYPASS OPEN   Left breast surgery   Past Family History: History reviewed. No pertinent family history.  Medications: Current Outpatient Medications Ordered in Epic  Medication Sig Dispense Refill   acetaminophen (TYLENOL) 325 MG tablet Take  325 mg by mouth every 6 (six) hours as needed for Pain   albuterol (VENTOLIN HFA) 90 mcg/actuation inhaler Inhale 2 inhalations into the lungs every 6 (six) hours as needed for Wheezing.   alendronate (FOSAMAX) 70 MG tablet TAKE 1 TABLET EVERY 7 DAYS TAKE WITH A FULL GLASS OF WATER. DO NOT LIE DOWN FOR THE NEXT 30 MIN. 12 tablet 1   busPIRone (BUSPAR) 10 MG tablet TAKE 1 TABLET BY MOUTH EVERY DAY 90 tablet 3   celecoxib (CELEBREX) 200 MG capsule Take 200 mg by mouth once daily   ciprofloxacin HCl (CIPRO) 500 MG tablet TAKE 1 TABLET BY MOUTH TWICE A DAY FOR 5 DAYS   diclofenac (VOLTAREN) 1 % topical gel Apply 2 g topically 4 (four) times daily 100 g 0   dicyclomine (BENTYL) 10 mg capsule   DULoxetine (CYMBALTA) 30 MG DR capsule Take 30 mg by mouth once daily.   escitalopram oxalate (LEXAPRO) 10 MG tablet Take 1 tablet (10 mg total) by mouth once daily 90 tablet 3   ferrous sulfate 325 (65 FE) MG tablet TAKE 1 TABLET BY MOUTH EVERY DAY WITH BREAKFAST 90 tablet 1   folic acid (FOLVITE) 1 MG tablet TAKE 1 TABLET BY MOUTH EVERY DAY 90 tablet 1   gabapentin (NEURONTIN) 800 MG tablet Take 800 mg by mouth 2 (two) times daily.   HYDROcodone-acetaminophen (NORCO) 5-325 mg tablet Take by mouth   isosorbide mononitrate (IMDUR) 60 MG ER tablet TAKE 1 TABLET BY MOUTH ONCE DAILY 30 tablet 6   levothyroxine (SYNTHROID, LEVOTHROID) 75 MCG tablet  Take 75 mcg by mouth once daily. Take on an empty stomach with a glass of water at least 30-60 minutes before breakfast.   LORazepam (ATIVAN) 0.5 MG tablet Take 0.5 mg by mouth nightly   metoprolol succinate (TOPROL-XL) 25 MG XL tablet Take 25 mg by mouth once daily for Blood Pressure   miscellaneous medical supply Misc Solid support carpal tunnel wrist splint 1 each 0   OLANZapine (ZYPREXA) 15 MG tablet Take 15 mg by mouth nightly.   omeprazole (PRILOSEC) 20 MG DR capsule Take 1 capsule (20 mg total) by mouth once daily 30 capsule 11   ondansetron (ZOFRAN) 4 MG tablet  Take by mouth   ondansetron (ZOFRAN-ODT) 4 MG disintegrating tablet Take by mouth   pantoprazole (PROTONIX) 40 MG DR tablet TAKE 1 TABLET BY MOUTH 2 TIMES DAILY TAKE 30 MINUTES BEFORE BREAKFAST AND 30 MINUTES BEFORE DINNER 180 tablet 0   traZODone (DESYREL) 100 MG tablet Take by mouth nightly.   URINARY PAIN RELIEF 95 mg tablet Take 95 mg by mouth 3 (three) times daily as needed   No current Epic-ordered facility-administered medications on file.   Allergies: No Known Allergies   Review of Systems:  A comprehensive 14 point ROS was performed, reviewed by me today, and the pertinent orthopaedic findings are documented in the HPI.  Exam: BP 120/82  Wt (!) 106.7 kg (235 lb 3.2 oz)  BMI 44.44 kg/m   General:  Well developed, well nourished, no apparent distress, normal affect, antalgic gait with walker HEENT: Head normocephalic, atraumatic, PERRL.   Abdomen: Soft, non tender, non distended, Bowel sounds present.  Heart: Examination of the heart reveals regular, rate, and rhythm. There is no murmur noted on ascultation. There is a normal apical pulse.  Lungs: Lungs are clear to auscultation. There is no wheeze, rhonchi, or crackles. There is normal expansion of bilateral chest walls.   Patient ambulates with a walker. Slightly antalgic gait. Slight valgus thrust to the left knee. Mild swelling with effusion. No warmth or redness. She is tender along the lateral joint line and nontender along the medial joint line. She is able to actively straight leg raise. Valgus deformity is completely passively corrected. Examination of the left hip shows good internal X rotation with only lateral knee pain with hip internal rotation. No swelling or edema throughout the left leg. She is neuro vas intact in left lower extremity  Imaging: AP standing and lateral and sunrise views of the left knee are reviewed by me in the office today. Impression: Patient has 90% loss of joint space in the lateral  compartment of the left knee with valgus deformity with severe sclerotic changes and spurring along the lateral femoral condyle and lateral tibial plateau. Patient has no evidence of acute bony abnormality or effusion. Patient does have some right knee degenerative changes in the medial compartment that are mild to moderate. Left knee shows normal tracking of the patella in the trochlear groove with severe patellofemoral osteoarthritis and spurring  AP pelvis and lateral view of the left hip reviewed interpreted by me in the office today. Impression: Patient has sclerotic changes in superior acetabulum with mild joint space narrowing and mild central joint space narrowing. No evidence of acute bony abnormality or abnormal bony lesions. Pelvic rings are intact.  EXAM:  CT OF THE LEFT KNEE WITHOUT CONTRAST   TECHNIQUE:  Multidetector CT imaging of the left knee was performed according to  the standard protocol. Multiplanar CT image reconstructions were  also generated.   COMPARISON:  Left femur x-rays dated April 15, 2017.   FINDINGS:  Bones/Joint/Cartilage   The hip demonstrates no fracture or dislocation. There is no lytic  or blastic lesion.   The knee demonstrates no fracture or dislocation. There is no lytic  or blastic lesion. Tricompartmental joint space narrowing with bulky  marginal osteophytes, moderate in the lateral and patellofemoral  compartments. Prominent subchondral sclerosis in the lateral  compartment. Small joint effusion and Baker cyst. Osteopenia.   The ankle demonstrates no fracture or dislocation. There is no lytic  or blastic lesion.   Ligaments   Suboptimally assessed by CT.   Muscles and Tendons   Grossly intact.  No muscle atrophy.   Soft tissues   No fluid collection or hematoma.  No soft tissue mass.   IMPRESSION:  1. Moderate tricompartmental osteoarthritis.   Impression: Primary osteoarthritis of left knee [M17.12] Primary osteoarthritis  of left knee (primary encounter diagnosis) Body mass index (BMI) of 40.0 to 44.9 in adult (CMS-HCC)  Plan:  57. 70 year old female with severe left knee pain, valgus deformity with effusion.She has complete loss of joint space in the lateral compartment of her left knee Conservative treatment no longer providing her with adequate relief. Pain is interfering with quality of life and activities daily living. Risks, benefits, complications of a left total knee arthroplasty have discussed with the patient. Patient has agreed and consented to a left total knee arthroplasty with Dr. Hessie Knows on 10/25/2020.  Feliberto Gottron MPA-C   Electronically signed by Feliberto Gottron, [place] at 10/17/2020 10:25 AM EDT  Reviewed  H+P. No changes noted.

## 2020-10-26 LAB — BASIC METABOLIC PANEL
Anion gap: 4 — ABNORMAL LOW (ref 5–15)
BUN: 8 mg/dL (ref 8–23)
CO2: 30 mmol/L (ref 22–32)
Calcium: 8.4 mg/dL — ABNORMAL LOW (ref 8.9–10.3)
Chloride: 107 mmol/L (ref 98–111)
Creatinine, Ser: 0.63 mg/dL (ref 0.44–1.00)
GFR, Estimated: >60 mL/min (ref >60)
Glucose, Bld: 114 mg/dL — ABNORMAL HIGH (ref 70–99)
Potassium: 4.2 mmol/L (ref 3.5–5.1)
Sodium: 141 mmol/L (ref 135–145)

## 2020-10-26 LAB — CBC
HCT: 33.8 % — ABNORMAL LOW (ref 36.0–46.0)
Hemoglobin: 10.8 g/dL — ABNORMAL LOW (ref 12.0–15.0)
MCH: 32.3 pg (ref 26.0–34.0)
MCHC: 32 g/dL (ref 30.0–36.0)
MCV: 101.2 fL — ABNORMAL HIGH (ref 80.0–100.0)
Platelets: 153 10*3/uL (ref 150–400)
RBC: 3.34 MIL/uL — ABNORMAL LOW (ref 3.87–5.11)
RDW: 13.1 % (ref 11.5–15.5)
WBC: 9 10*3/uL (ref 4.0–10.5)
nRBC: 0 % (ref 0.0–0.2)

## 2020-10-26 NOTE — Progress Notes (Signed)
Physical Therapy Treatment Patient Details Name: Shelly Freeman MRN: [id number] DOB: 11-27-1950 Today's Date: 10/26/2020    History of Present Illness Patient is a 70 year old female with primary localized osteoarthritis of left knee s/p TKA. Past medical history significant for arthritis, systemic lupus erythematosus, neuromuscular disorder, HTN, gastric bypass surgery, CKD,    PT Comments    Pt was long sitting in bed upon arriving. She is alert but somewhat lethargic throughout session. C/o 8/10 pain but eager for OOB. Was able to exit L side of bed with increased time and mod assist. Sat EOB x 10 minutes. Removed O2 with Sao2 > 96% throughout remainder of session.RN aware. Pt is extremely slow morning. Will need rehab at DC to address deficits and improve independence prior to returning home. Pt states she lives alone and is planning for rehab. She was able to take ~ 5 steps with RW to recliner however extremely; antalgic, slow, step to pattern. At conclusion of session, pt was in recliner with call bell in reach, chair alarm set and RN aware of abilities. Will return later this afternoon to continue to treat per POC.     Follow Up Recommendations  SNF     Equipment Recommendations  None recommended by PT       Precautions / Restrictions Precautions Precautions: Knee Precaution Booklet Issued: Yes (comment) Precaution Comments: handout provided in Spanish Restrictions Weight Bearing Restrictions: Yes LLE Weight Bearing: Weight bearing as tolerated    Mobility  Bed Mobility Overal bed mobility: Needs Assistance Bed Mobility: Supine to Sit     Supine to sit: Min assist;Mod assist     General bed mobility comments: Pt required increased time and assistance to roll L to short sit. able to roll L with min A but required mod assist to achieve EOB short sit. sao2 >96% throughout session    Transfers Overall transfer level: Needs assistance Equipment used: Rolling walker (2  wheeled) Transfers: Sit to/from Stand Sit to Stand: Mod assist         General transfer comment: Pt stood 3 x EOB during session with mod assist + max vcs for technique and sequencing.  Ambulation/Gait Ambulation/Gait assistance: Mod assist Gait Distance (Feet): 5 Feet Assistive device: Rolling walker (2 wheeled) Gait Pattern/deviations: Shuffle;Antalgic;Decreased step length - right;Decreased stance time - left Gait velocity: decreaesed   General Gait Details: Pt was able to take ~ 5 steps to recliner form EOB. very slow moving. Pt struggles with non operative LE advancement due to Pain/wt on operative LE       Balance Overall balance assessment: Needs assistance Sitting-balance support: Bilateral upper extremity supported;Feet supported Sitting balance-Leahy Scale: Good Sitting balance - Comments: no LOB in sitting x > 10 minutes   Standing balance support: Bilateral upper extremity supported;During functional activity Standing balance-Leahy Scale: Poor Standing balance comment: completely dependent with UE support during all standing activity         Cognition Arousal/Alertness: Lethargic Behavior During Therapy: WFL for tasks assessed/performed Overall Cognitive Status: Within Functional Limits for tasks assessed        General Comments: patient able to follow single step commands with extra time      Exercises Total Joint Exercises Goniometric ROM: 7-83        Pertinent Vitals/Pain Pain Assessment: 0-10 Pain Score: 8  Pain Location: left knee Pain Descriptors / Indicators: Grimacing;Guarding Pain Intervention(s): Limited activity within patient's tolerance;Monitored during session;Repositioned     PT Goals (current goals can now be  found in the care plan section) Acute Rehab PT Goals Patient Stated Goal: to go to rehab Progress towards PT goals: Progressing toward goals    Frequency    BID      PT Plan Current plan remains appropriate        AM-PAC PT "6 Clicks" Mobility   Outcome Measure  Help needed turning from your back to your side while in a flat bed without using bedrails?: A Lot Help needed moving from lying on your back to sitting on the side of a flat bed without using bedrails?: A Lot Help needed moving to and from a bed to a chair (including a wheelchair)?: A Lot Help needed standing up from a chair using your arms (e.g., wheelchair or bedside chair)?: A Lot Help needed to walk in hospital room?: A Lot Help needed climbing 3-5 steps with a railing? : A Lot 6 Click Score: 12    End of Session Equipment Utilized During Treatment: Gait belt;Oxygen (O2 removed during session. RN aware) Activity Tolerance: Patient limited by pain Patient left: in chair;with call bell/phone within reach;with chair alarm set Nurse Communication: Mobility status PT Visit Diagnosis: Pain;Difficulty in walking, not elsewhere classified (R26.2);Muscle weakness (generalized) (M62.81) Pain - Right/Left: Left Pain - part of body: Knee     Time: 6728-9791 PT Time Calculation (min) (ACUTE ONLY): 38 min  Charges:  $Gait Training: 8-22 mins                     Julaine Fusi PTA 10/26/20, 9:37 AM

## 2020-10-26 NOTE — TOC Progression Note (Signed)
Transition of Care [facility]) - Progression Note    Patient Details  Name: Beadie Matsunaga MRN: [id number] Date of Birth: [birth year]  Transition of [facility]) CM/SW [place], RN Phone Number: 10/26/2020, 4:25 PM  Clinical Narrative:     Damaris Schooner with [facility] at  [facility] Accepted the bed offer at [facility], she stated that she is going to call the grand daughter to confirm, she will provide the Covid vaccine dates and call back with confirmation of Bed acceptance, [facility] will transport       Expected Discharge Plan and Services                                                 Social Determinants of Health (SDOH) Interventions    Readmission Risk Interventions Readmission Risk Prevention Plan 01/30/2019  Post Dischage Appt Complete  Medication Screening Complete  Transportation Screening Complete  Some recent data might be hidden

## 2020-10-26 NOTE — Evaluation (Signed)
Occupational Therapy Evaluation Patient Details Name: Shelly Freeman MRN: [id number] DOB: 1951/03/24 Today's Date: 10/26/2020    History of Present Illness Patient is a 70 year old female with primary localized osteoarthritis of left knee s/p TKA. Past medical history significant for arthritis, systemic lupus erythematosus, neuromuscular disorder, HTN, gastric bypass surgery, CKD,   Clinical Impression   Pt seen for OT evaluation this date in setting of acute hospitalization s/p TKA. Pt reports being INDEP for self care at baseline and MOD I for fxl mobility. Pt presents this date largely pain limited and demos limited tolerance for even elevating L LE off bed surface while in supine (only clears ~1-2 inches). Pt not agreeable to OOB activity this date citing pain and is only able to clear ~3-4 inches with an attempt to perform sup/high-fowler's position to long sitting and ultimately requires MOD/MAX A. OT facilitates ed re: role of OT in acute setting and following TKR, polar care mgt, and compression stocking mgt. PT with good understanding. Pt able to perform bed level/high fowler's position UB ADLs with SETUP to MIN A and requires MAX to TOTAL A for LB ADLs including donning socks citing pain. Will continue to follow acutely, but as pt is presenting with decreased fxl activity tolerance, balance, and tolerance for basic self care 2/2 pain, anticipate she will require f/u OT services in STR setting.     Follow Up Recommendations  SNF    Equipment Recommendations  Other (comment) (defer)    Recommendations for Other Services       Precautions / Restrictions Precautions Precautions: Knee Precaution Booklet Issued: Yes (comment) Precaution Comments: handout provided in Spanish Restrictions Weight Bearing Restrictions: Yes LLE Weight Bearing: Weight bearing as tolerated      Mobility Bed Mobility Overal bed mobility: Needs Assistance             General bed mobility  comments: pt declines to perform sup to sit on OT evaluation citing pain although pt was noted to have recieved medication and can be observed falling asleep throughout session. Pt does attempt to come to long sitting when cued with HOB elevated, but only clears ~3 inches before returning to supine/high folwer's position    Transfers                 General transfer comment: deferred, citing pain    Balance Overall balance assessment: Needs assistance Sitting-balance support: Bilateral upper extremity supported;Feet supported   Sitting balance - Comments: deferred       Standing balance comment: deferred                           ADL either performed or assessed with clinical judgement   ADL Overall ADL's : Needs assistance/impaired                                       General ADL Comments: Pt requires SETUP to MIN A for UB bed level ADLs, Requires MAX to TOTAL A for bed level LB ADLs, declines to get OOB with OT this session citing pain     Vision Patient Visual Report: No change from baseline       Perception     Praxis      Pertinent Vitals/Pain Pain Assessment: 0-10 Pain Score: 10-Worst pain ever Pain Location: left knee Pain Descriptors / Indicators: Grimacing;Guarding Pain Intervention(s):  Limited activity within patient's tolerance;Monitored during session;Premedicated before session;Repositioned     Hand Dominance Right   Extremity/Trunk Assessment Upper Extremity Assessment Upper Extremity Assessment: Generalized weakness   Lower Extremity Assessment Lower Extremity Assessment: Generalized weakness;LLE deficits/detail LLE Deficits / Details: limited ability to SLR while in supine 2/2 pain. generally limtied in hip and knee flexion/extion r/t pain this date. LLE: Unable to fully assess due to pain LLE Sensation: WNL       Communication Communication Communication: Interpreter utilized (#[id number] Jarrett [place] via [place])    Cognition Arousal/Alertness: Awake/alert Behavior During Therapy: WFL for tasks assessed/performed Overall Cognitive Status: Within Functional Limits for tasks assessed                                 General Comments: Used interpreter throughout however pt does understand and converse in [place]       Exercises Exercises: Total Joint Total Joint Exercises Ankle Circles/Pumps: AROM;Strengthening;Left;10 reps;Supine Quad Sets: AROM;10 reps;Supine Gluteal Sets: AROM;10 reps Heel Slides: AAROM;10 reps;Supine;Other (comment) Hip ABduction/ADduction: AAROM;10 reps Straight Leg Raises: AAROM;10 reps Goniometric ROM: 5-75 Other Exercises Other Exercises: OT engages pt in ed re: role of OT in acute setting, safety considerations, compression stocking mgt and polar care mgt with interpreter assisting in education and Pt engaging ~80% of the time and asking appropriate questions. Will require f/u as she is distrated by pain and simultaneously falling asleep citing pain medication throughout session.   Shoulder Instructions      Home Living Family/patient expects to be discharged to:: Private residence Living Arrangements: Alone Available Help at Discharge: Family;Available PRN/intermittently Type of Home: Apartment Home Access: Level entry     Home Layout: One level               Home Equipment: Walker - 2 wheels          Prior Functioning/Environment Level of Independence: Independent with assistive device(s)        Comments: Patient reports she uses a 2 wheeled walker for ambulation and independent with ADLs at baseline. She reports she does not drive and her friends provide her with a ride to the doctor if needed. Patient reports "many" falls in the past 6 months        OT Problem List: Decreased strength;Decreased activity tolerance      OT Treatment/Interventions: Self-care/ADL training;DME and/or AE instruction;Therapeutic  activities;Balance training;Therapeutic exercise;Energy conservation;Patient/family education    OT Goals(Current goals can be found in the care plan section) Acute Rehab OT Goals Patient Stated Goal: to go to rehab OT Goal Formulation: With patient Time For Goal Achievement: 11/09/20 Potential to Achieve Goals: Good ADL Goals Pt Will Perform Lower Body Bathing: with min assist;with adaptive equipment Pt Will Perform Lower Body Dressing: with min assist;sit to/from stand (with LRAD/AE PRN) Pt Will Transfer to Toilet: with min assist;ambulating;bedside commode (with LRAD to/from commode at least ~10-15' away to improve tolerance for fxl HH distances.) Pt Will Perform Toileting - Clothing Manipulation and hygiene: with min assist;sit to/from stand  OT Frequency: Min 2X/week   Barriers to D/C:            Co-evaluation              AM-PAC OT "6 Clicks" Daily Activity     Outcome Measure Help from another person eating meals?: None Help from another person taking care of personal grooming?: A Little Help from another  person toileting, which includes using toliet, bedpan, or urinal?: A Lot Help from another person bathing (including washing, rinsing, drying)?: A Lot Help from another person to put on and taking off regular upper body clothing?: A Little Help from another person to put on and taking off regular lower body clothing?: Total 6 Click Score: 15   End of Session Nurse Communication: Mobility status  Activity Tolerance: Patient limited by pain Patient left: in bed;with call bell/phone within reach;with bed alarm set  OT Visit Diagnosis: Unsteadiness on feet (R26.81);Muscle weakness (generalized) (M62.81);Pain Pain - Right/Left: Left Pain - part of body: Knee                Time: 8421-0312 OT Time Calculation (min): 17 min Charges:  OT General Charges $OT Visit: 1 Visit OT Evaluation $OT Eval Moderate Complexity: 1 Mod OT Treatments $Self Care/Home Management :  8-22 mins  Gerrianne Scale, MS, OTR/L ascom [phone] 10/26/20, 3:03 PM

## 2020-10-26 NOTE — NC FL2 (Signed)
[place] LEVEL OF CARE SCREENING TOOL     IDENTIFICATION  Patient Name: Shelly Freeman Birthdate: 05-06-1951 Sex: female Admission Date (Current Location): 10/25/2020  [facility] and [place] Number:  Engineering geologist and Address:  [facility], [address] [place], [place] [address]      Provider Number: [id number]  Attending Physician Name and Address:  Hessie Knows, MD  Relative Name and Phone Number:  Shelly Freeman daughter [phone]    Current Level of Care: Hospital Recommended Level of Care: [place] Prior Approval Number:    Date Approved/Denied:   PASRR Number: [id number]  Discharge Plan:      Current Diagnoses: Patient Active Problem List   Diagnosis Date Noted   S/P TKR (total knee replacement) using cement, 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   Bilateral pneumonia 08/29/2020   AKI (acute kidney injury) ([place]) 08/29/2020   COPD (chronic obstructive pulmonary disease) ([place]) 08/29/2020   Fall at home, initial encounter 08/29/2020   CAP (community acquired pneumonia) 08/29/2020   Obesity, Class III, BMI 40-49.9 (morbid obesity) ([place]) 05/23/2019   UTI (urinary tract infection) 05/23/2019   Right rib fracture 05/23/2019   SIRS (systemic inflammatory response syndrome) ([place]) 05/23/2019   Bacteremia due to Gram-negative bacteria 05/23/2019   Acute bronchiolitis 01/28/2019   Left anterior knee pain 10/24/2016   Right upper quadrant pain 10/24/2016   Urination pain 06/26/2016   DDD (degenerative disc disease), lumbar 05/14/2016   Lipoma of skin 05/14/2016   Vitamin D deficiency, unspecified 05/14/2016   Major neurocognitive disorder, due to vascular disease, with behavioral disturbance, mild ([place]) 04/15/2016   Severe recurrent major depression with psychotic features ([place]) 04/04/2016   Lupus ([place]) 04/04/2016   GERD (gastroesophageal reflux disease) 04/04/2016   COPD with acute bronchitis ([place]) 04/04/2016   Acute  respiratory failure with hypoxia ([place]) 04/03/2016   Encephalopathy, metabolic 60/63/0160   Hypokalemia 04/03/2016   Pain in limb 04/03/2016   Hypothyroidism 04/03/2016   Essential hypertension 04/03/2016   Intentional opiate overdose ([place]) 03/30/2016   Age related osteoporosis 03/19/2016   Chronic midline low back pain without sciatica 03/19/2016   Encounter for long-term (current) use of high-risk medication 03/19/2016   Primary osteoarthritis of both knees 03/19/2016    Orientation RESPIRATION BLADDER Height & Weight     Self, Time, Situation, Place  Normal External catheter, Continent Weight: 106.1 kg Height:  5\' 1"  (154.9 cm)  BEHAVIORAL SYMPTOMS/MOOD NEUROLOGICAL BOWEL NUTRITION STATUS      Continent Diet (regular)  AMBULATORY STATUS COMMUNICATION OF NEEDS Skin   Extensive Assist Verbally Surgical wounds                       Personal Care Assistance Level of Assistance  Bathing, Dressing Bathing Assistance: Limited assistance   Dressing Assistance: Limited assistance     Functional Limitations Info  Hearing   Hearing Info: Impaired      SPECIAL CARE FACTORS FREQUENCY  PT (By licensed PT), OT (By licensed OT)     PT Frequency: 5 times per week OT Frequency: 5 times per week            Contractures Contractures Info: Not present    Additional Factors Info  Code Status, Allergies Code Status Info: full code Allergies Info: NKDA           Current Medications (10/26/2020):  This is the current hospital active medication list Current Facility-Administered Medications  Medication Dose Route Frequency Provider Last Rate  Last Admin   0.9 %  sodium chloride infusion   Intravenous Continuous Hessie Knows, MD   Stopped at 10/26/20 [phone]   acetaminophen (TYLENOL) tablet 325-650 mg  325-650 mg Oral Q6H PRN Hessie Knows, MD       alum & mag hydroxide-simeth (MAALOX/MYLANTA) 200-200-20 MG/5ML suspension 30 mL  30 mL Oral Q4H PRN Hessie Knows, MD        bisacodyl (DULCOLAX) suppository 10 mg  10 mg Rectal Daily PRN Hessie Knows, MD       diphenhydrAMINE (BENADRYL) 12.5 MG/5ML elixir 12.5-25 mg  12.5-25 mg Oral Q4H PRN Hessie Knows, MD       docusate sodium (COLACE) capsule 100 mg  100 mg Oral BID Hessie Knows, MD   100 mg at 10/26/20 0950   enoxaparin (LOVENOX) injection 30 mg  30 mg Subcutaneous Q12H Hessie Knows, MD   30 mg at 10/26/20 0949   escitalopram (LEXAPRO) tablet 20 mg  20 mg Oral Daily Hessie Knows, MD   20 mg at 10/26/20 0950   famotidine (PEPCID) tablet 20 mg  20 mg Oral BID Hessie Knows, MD   20 mg at 10/26/20 1937   ferrous sulfate tablet 325 mg  325 mg Oral Daily Hessie Knows, MD   325 mg at 90/24/09 7353   folic acid (FOLVITE) tablet 1 mg  1 mg Oral Daily Hessie Knows, MD   1 mg at 10/26/20 0950   gabapentin (NEURONTIN) capsule 800 mg  800 mg Oral BID Hessie Knows, MD   800 mg at 10/26/20 0949   HYDROcodone-acetaminophen (NORCO) 7.5-325 MG per tablet 1-2 tablet  1-2 tablet Oral Q4H PRN Hessie Knows, MD   2 tablet at 10/26/20 0256   HYDROcodone-acetaminophen (NORCO/VICODIN) 5-325 MG per tablet 1-2 tablet  1-2 tablet Oral Q4H PRN Hessie Knows, MD   2 tablet at 10/25/20 2105   hydroxychloroquine (PLAQUENIL) tablet 200 mg  200 mg Oral BID Hessie Knows, MD   200 mg at 10/26/20 0950   isosorbide mononitrate (IMDUR) 24 hr tablet 30 mg  30 mg Oral Daily Hessie Knows, MD       levothyroxine (SYNTHROID) tablet 75 mcg  75 mcg Oral QAC breakfast Hessie Knows, MD   75 mcg at 10/26/20 2992   LORazepam (ATIVAN) tablet 0.5 mg  0.5 mg Oral QHS Hessie Knows, MD   0.5 mg at 10/25/20 2104   menthol-cetylpyridinium (CEPACOL) lozenge 3 mg  1 lozenge Oral PRN Hessie Knows, MD       Or   phenol (CHLORASEPTIC) mouth spray 1 spray  1 spray Mouth/Throat PRN Hessie Knows, MD       methocarbamol (ROBAXIN) tablet 500 mg  500 mg Oral Q6H PRN Hessie Knows, MD       Or   methocarbamol (ROBAXIN) 500 mg in dextrose 5 % 50 mL IVPB  500 mg  Intravenous Q6H PRN Hessie Knows, MD       metoprolol succinate (TOPROL-XL) 24 hr tablet 25 mg  25 mg Oral Daily Hessie Knows, MD   25 mg at 10/26/20 0949   morphine 2 MG/ML injection 0.5-1 mg  0.5-1 mg Intravenous Q2H PRN Hessie Knows, MD       OLANZapine [facility]) tablet 15 mg  15 mg Oral QHS Hessie Knows, MD   15 mg at 10/25/20 2105   ondansetron (ZOFRAN) tablet 4 mg  4 mg Oral Q6H PRN Hessie Knows, MD       Or   ondansetron [facility]) injection 4 mg  4  mg Intravenous Q6H PRN Hessie Knows, MD       pantoprazole (PROTONIX) EC tablet 20 mg  20 mg Oral Daily Hessie Knows, MD   20 mg at 10/26/20 0950   polyethylene glycol (MIRALAX / GLYCOLAX) packet 17 g  17 g Oral Daily PRN Hessie Knows, MD       traMADol [facility]) tablet 50 mg  50 mg Oral Q6H Hessie Knows, MD   50 mg at 10/26/20 1228   traZODone (DESYREL) tablet 125 mg  125 mg Oral QHS Hessie Knows, MD   125 mg at 10/25/20 2104   zolpidem (AMBIEN) tablet 5 mg  5 mg Oral QHS PRN Hessie Knows, MD         Discharge Medications: Please see discharge summary for a list of discharge medications.  Relevant Imaging Results:  Relevant Lab Results:   Additional Information SS# [id number]  Su Hilt, RN

## 2020-10-26 NOTE — TOC Progression Note (Signed)
Transition of Care [facility]) - Progression Note    Patient Details  Name: Shelly Freeman MRN: [id number] Date of Birth: 1950/11/29  Transition of Care [facility]) CM/SW [place], RN Phone Number: 10/26/2020, 3:07 PM  Clinical Narrative:      The patient is agreeable to go to SNF, [facility]ead is in charge of her care, received a call from [place] at [facility], [phone], [phone], she would like to see if the patient can go to [facility] [place] , [facility] or [facility], [place] sent to each of those      Expected Discharge Plan and Services                                                 Social Determinants of Health (SDOH) Interventions    Readmission Risk Interventions Readmission Risk Prevention Plan 01/30/2019  Post Dischage Appt Complete  Medication Screening Complete  Transportation Screening Complete  Some recent data might be hidden

## 2020-10-26 NOTE — Progress Notes (Signed)
   Subjective: 1 Day Post-Op Procedure(s) (LRB): TOTAL KNEE ARTHROPLASTY (Left) Patient reports pain as 10 on 0-10 scale.   Patient is well, and has had no acute complaints or problems Denies any CP, SOB, ABD pain. We will continue therapy today.   Objective: Vital signs in last 24 hours: Temp:  [96.9 F (36.1 C)-98.1 F (36.7 C)] 98 F (36.7 C) (06/22 0810) Pulse Rate:  [58-71] 63 (06/22 0810) Resp:  [10-25] 18 (06/22 0810) BP: (101-157)/(49-96) 118/49 (06/22 0810) SpO2:  [92 %-100 %] 96 % (06/22 0810)  Intake/Output from previous day: 06/21 0701 - 06/22 0700 In: 1749.6 [I.V.:1649.6; IV Piggyback:100] Out: 50 [Blood:50] Intake/Output this shift: No intake/output data recorded.  Recent Labs    10/25/20 1439 10/26/20 0701  HGB 13.7 10.8*   Recent Labs    10/25/20 1439 10/26/20 0701  WBC 6.9 9.0  RBC 4.24 3.34*  HCT 42.7 33.8*  PLT 163 153   Recent Labs    10/25/20 1439 10/26/20 0701  NA  --  141  K  --  4.2  CL  --  107  CO2  --  30  BUN  --  8  CREATININE 0.63 0.63  GLUCOSE  --  114*  CALCIUM  --  8.4*   No results for input(s): LABPT, INR in the last 72 hours.  EXAM General - Patient is Alert, Appropriate, and Oriented Extremity - Neurovascular intact Sensation intact distally Intact pulses distally Dorsiflexion/Plantar flexion intact No cellulitis present Compartment soft Dressing - dressing C/D/I and no drainage, prevena intact with out drainage Motor Function - intact, moving foot and toes well on exam.   Past Medical History:  Diagnosis Date   Arthritis    Collagen vascular disease (HCC)    COPD (chronic obstructive pulmonary disease) (HCC)    DDD (degenerative disc disease), lumbar 05/14/2016   Depression    Dizziness    Dyspnea    GERD (gastroesophageal reflux disease)    Heart valve problem    per grandaughter need surgery but they told her may not survive;   History of kidney stones    Hypertension    Hypothyroidism    Lupus  ([place])    Neuromuscular disorder ([place])    Sleep apnea    no CPAP   Thyroid disease    Vitamin D deficiency 05/14/2016    Assessment/Plan:   1 Day Post-Op Procedure(s) (LRB): TOTAL KNEE ARTHROPLASTY (Left) Active Problems:   S/P TKR (total knee replacement) using cement, left  Estimated body mass index is 44.21 kg/m as calculated from the following:   Height as of this encounter: 5\' 1"  (1.549 m).   Weight as of this encounter: 106.1 kg. Advance diet Up with therapy Work on pain control, continue with current pain regimen VSS Labs stable Work on BM CM to assist with discharge  DVT Prophylaxis - Lovenox, Foot Pumps, and TED hose Weight-Bearing as tolerated to left leg   T. Rachelle Hora, PA-C [place] 10/26/2020, 8:19 AM

## 2020-10-26 NOTE — Progress Notes (Signed)
Physical Therapy Treatment Patient Details Name: Shelly Freeman MRN: [id number] DOB: [birth year] Today's Date: 10/26/2020    History of Present Illness Patient is a 70 year old female with primary localized osteoarthritis of left knee s/p TKA. Past medical history significant for arthritis, systemic lupus erythematosus, neuromuscular disorder, HTN, gastric bypass surgery, CKD,    PT Comments    Pt was long sitting in bed upon arriving. Endorses 8/10 pain but was agreeable to session. Unwilling to get OOB this session but did participate and perform there ex handout. Tolerated ROM better this afternoon. Pain continues to limit her overall progress but she is progressing. See exercises performed listed below. Highly recommend DC to SNF to address deficits while maximizing independence with ADLs.   Follow Up Recommendations  SNF     Equipment Recommendations  Other (comment) (defer to next level of care)       Precautions / Restrictions Precautions Precautions: Knee Precaution Booklet Issued: Yes (comment) Precaution Comments: handout provided in Spanish Restrictions Weight Bearing Restrictions: Yes LLE Weight Bearing: Weight bearing as tolerated    Mobility  Bed Mobility     General bed mobility comments: pt was unwilling to get OOB this session 2/2 to fatigue/pain. Did tolerate and fully participate in ther ex handout.          Cognition Arousal/Alertness: Awake/alert Behavior During Therapy: WFL for tasks assessed/performed Overall Cognitive Status: Within Functional Limits for tasks assessed      General Comments: Used interpreter throughout however pt does understand and converse in english      Exercises Total Joint Exercises Ankle Circles/Pumps: AROM;Strengthening;Left;10 reps;Supine Quad Sets: AROM;10 reps;Supine Gluteal Sets: AROM;10 reps Heel Slides: AAROM;10 reps;Supine;Other (comment) Hip ABduction/ADduction: AAROM;10 reps Straight Leg Raises: AAROM;10  reps Goniometric ROM: 5-75        Pertinent Vitals/Pain Pain Assessment: 0-10 Pain Score: 8  Pain Location: left knee Pain Descriptors / Indicators: Grimacing;Guarding Pain Intervention(s): Limited activity within patient's tolerance;Monitored during session;Premedicated before session;Repositioned;Ice applied     PT Goals (current goals can now be found in the care plan section) Acute Rehab PT Goals Patient Stated Goal: to go to rehab Progress towards PT goals: Progressing toward goals    Frequency    BID      PT Plan Current plan remains appropriate       AM-PAC PT "6 Clicks" Mobility   Outcome Measure  Help needed turning from your back to your side while in a flat bed without using bedrails?: A Lot Help needed moving from lying on your back to sitting on the side of a flat bed without using bedrails?: A Lot Help needed moving to and from a bed to a chair (including a wheelchair)?: A Lot Help needed standing up from a chair using your arms (e.g., wheelchair or bedside chair)?: A Lot Help needed to walk in hospital room?: A Lot Help needed climbing 3-5 steps with a railing? : A Lot 6 Click Score: 12    End of Session Equipment Utilized During Treatment: Gait belt;Oxygen Activity Tolerance: Patient limited by pain;Patient limited by fatigue (tolerated ther ex well without increased pain) Patient left: in bed;with call bell/phone within reach;with bed alarm set;with SCD's reapplied Nurse Communication: Mobility status PT Visit Diagnosis: Pain;Difficulty in walking, not elsewhere classified (R26.2);Muscle weakness (generalized) (M62.81) Pain - Right/Left: Left Pain - part of body: Knee     Time: 7322-0254 PT Time Calculation (min) (ACUTE ONLY): 16 min  Charges:  $Therapeutic Exercise: 8-22 mins  Julaine Fusi PTA 10/26/20, 2:42 PM

## 2020-10-27 MED ORDER — DOCUSATE SODIUM 100 MG PO CAPS: 100.0000 mg | ORAL_CAPSULE | Freq: Two times a day (BID) | ORAL | 0 refills | Status: DC

## 2020-10-27 MED ORDER — ONDANSETRON HCL 4 MG PO TABS: 4.0000 mg | ORAL_TABLET | Freq: Four times a day (QID) | ORAL | 0 refills | Status: DC | PRN

## 2020-10-27 MED ORDER — HYDROCODONE-ACETAMINOPHEN 7.5-325 MG PO TABS: 1.0000 | ORAL_TABLET | ORAL | 0 refills | Status: DC | PRN

## 2020-10-27 MED ORDER — TRAMADOL HCL 50 MG PO TABS: 50.0000 mg | ORAL_TABLET | Freq: Four times a day (QID) | ORAL | 0 refills | Status: DC

## 2020-10-27 MED ORDER — ENOXAPARIN SODIUM 40 MG/0.4ML IJ SOSY: 40.0000 mg | PREFILLED_SYRINGE | INTRAMUSCULAR | 0 refills | Status: DC

## 2020-10-27 MED ORDER — METHOCARBAMOL 500 MG PO TABS: 500.0000 mg | ORAL_TABLET | Freq: Four times a day (QID) | ORAL | 0 refills | Status: DC | PRN

## 2020-10-27 NOTE — Discharge Summary (Signed)
Physician Discharge Summary  Patient ID: Shelly Freeman MRN: [id number] DOB/AGE: 1950/10/06 70 y.o.  Admit date: 10/25/2020 Discharge date: 10/27/2020  Admission Diagnoses:  S/P TKR (total knee replacement) using cement, left [Z96.652]   Discharge Diagnoses: Patient Active Problem List   Diagnosis Date Noted   S/P TKR (total knee replacement) using cement, 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   Bilateral pneumonia 08/29/2020   AKI (acute kidney injury) ([place]) 08/29/2020   COPD (chronic obstructive pulmonary disease) ([place]) 08/29/2020   Fall at home, initial encounter 08/29/2020   CAP (community acquired pneumonia) 08/29/2020   Obesity, Class III, BMI 40-49.9 (morbid obesity) ([place]) 05/23/2019   UTI (urinary tract infection) 05/23/2019   Right rib fracture 05/23/2019   SIRS (systemic inflammatory response syndrome) ([place]) 05/23/2019   Bacteremia due to Gram-negative bacteria 05/23/2019   Acute bronchiolitis 01/28/2019   Left anterior knee pain 10/24/2016   Right upper quadrant pain 10/24/2016   Urination pain 06/26/2016   DDD (degenerative disc disease), lumbar 05/14/2016   Lipoma of skin 05/14/2016   Vitamin D deficiency, unspecified 05/14/2016   Major neurocognitive disorder, due to vascular disease, with behavioral disturbance, mild ([place]) 04/15/2016   Severe recurrent major depression with psychotic features ([place]) 04/04/2016   Lupus ([place]) 04/04/2016   GERD (gastroesophageal reflux disease) 04/04/2016   COPD with acute bronchitis ([place]) 04/04/2016   Acute respiratory failure with hypoxia ([place]) 04/03/2016   Encephalopathy, metabolic 82/99/3716   Hypokalemia 04/03/2016   Pain in limb 04/03/2016   Hypothyroidism 04/03/2016   Essential hypertension 04/03/2016   Intentional opiate overdose ([place]) 03/30/2016   Age related osteoporosis 03/19/2016   Chronic midline low back pain without sciatica 03/19/2016   Encounter for long-term (current) use of high-risk medication 03/19/2016   Primary  osteoarthritis of both knees 03/19/2016    Past Medical History:  Diagnosis Date   Arthritis    Collagen vascular disease ([place])    COPD (chronic obstructive pulmonary disease) ([place])    DDD (degenerative disc disease), lumbar 05/14/2016   Depression    Dizziness    Dyspnea    GERD (gastroesophageal reflux disease)    Heart valve problem    per grandaughter need surgery but they told her may not survive;   History of kidney stones    Hypertension    Hypothyroidism    Lupus ([place])    Neuromuscular disorder ([place])    Sleep apnea    no CPAP   Thyroid disease    Vitamin D deficiency 05/14/2016     Transfusion: none   Consultants (if any):   Discharged Condition: Improved  Hospital Course: Shelly Freeman is an 70 y.o. female who was admitted 10/25/2020 with a diagnosis of left knee osteoarthritis and went to the operating room on 10/25/2020 and underwent the above named procedures.    Surgeries: Procedure(s): TOTAL KNEE ARTHROPLASTY on 10/25/2020 Patient tolerated the surgery well. Taken to PACU where she was stabilized and then transferred to the orthopedic floor.  Started on Lovenox 30 mg q 12 hrs. Foot pumps applied bilaterally at 80 mm. Heels elevated on bed with rolled towels. No evidence of DVT. Negative [place]. Physical therapy started on day #1 for gait training and transfer. OT started day #1 for ADL and assisted devices.  Patient's foley was d/c on day #1. Patient's IV was d/c on day #2.  On post op day #2 patient was stable and ready for discharge to SNF.  Implants: Medacta  GMK  system with 3D PS femur, 2 left tibia with short stem  and 14 PS mm insert.  Size 1 patella, all components cemented  She was given perioperative antibiotics:  Anti-infectives (From admission, onward)    Start     Dose/Rate Route Frequency Ordered Stop   10/25/20 2200  hydroxychloroquine (PLAQUENIL) tablet 200 mg        200 mg Oral 2 times daily 10/25/20 1409     10/25/20 1600  ceFAZolin  (ANCEF) IVPB 2g/100 mL premix        2 g 200 mL/hr over 30 Minutes Intravenous Every 6 hours 10/25/20 1409 10/25/20 2134   10/25/20 0718  ceFAZolin (ANCEF) 2-4 GM/100ML-% IVPB       Note to Pharmacy: Maryagnes Amos   : cabinet override      10/25/20 0718 10/25/20 1143   10/25/20 0600  ceFAZolin (ANCEF) IVPB 2g/100 mL premix        2 g 200 mL/hr over 30 Minutes Intravenous On call to O.R. 10/25/20 9449 10/25/20 1035     .  She was given sequential compression devices, early ambulation, and Lovenox TEDs for DVT prophylaxis.  She benefited maximally from the hospital stay and there were no complications.    Recent vital signs:  Vitals:   10/27/20 0432 10/27/20 0758  BP: (!) 116/48 (!) 121/46  Pulse: 89 83  Resp: 20 18  Temp: 98.3 F (36.8 C) 98.2 F (36.8 C)  SpO2: 93% 94%    Recent laboratory studies:  Lab Results  Component Value Date   HGB 10.8 (L) 10/26/2020   HGB 13.7 10/25/2020   HGB 13.3 10/19/2020   Lab Results  Component Value Date   WBC 9.0 10/26/2020   PLT 153 10/26/2020   Lab Results  Component Value Date   INR 0.9 05/27/2019   Lab Results  Component Value Date   NA 141 10/26/2020   K 4.2 10/26/2020   CL 107 10/26/2020   CO2 30 10/26/2020   BUN 8 10/26/2020   CREATININE 0.63 10/26/2020   GLUCOSE 114 (H) 10/26/2020    Discharge Medications:   Allergies as of 10/27/2020   No Known Allergies      Medication List     STOP taking these medications    acetaminophen 325 MG tablet Commonly known as: TYLENOL   albuterol 108 (90 Base) MCG/ACT inhaler Commonly known as: VENTOLIN HFA   B COMPLEX PO   busPIRone 10 MG tablet Commonly known as: BUSPAR   diclofenac Sodium 1 % Gel Commonly known as: VOLTAREN   DULoxetine 60 MG capsule Commonly known as: CYMBALTA   hydroxychloroquine 200 MG tablet Commonly known as: Plaquenil   ibuprofen 200 MG tablet Commonly known as: ADVIL   ipratropium-albuterol 0.5-2.5 (3) MG/3ML Soln Commonly  known as: DUONEB       TAKE these medications    alendronate 70 MG tablet Commonly known as: FOSAMAX Take 70 mg by mouth once a week. Take with a full glass of water on an empty stomach.   celecoxib 200 MG capsule Commonly known as: CELEBREX Take 200 mg by mouth daily.   docusate sodium 100 MG capsule Commonly known as: COLACE Take 1 capsule (100 mg total) by mouth 2 (two) times daily.   enoxaparin 40 MG/0.4ML injection Commonly known as: LOVENOX Inject 0.4 mLs (40 mg total) into the skin daily for 14 days.   escitalopram 20 MG tablet Commonly known as: LEXAPRO Take 20 mg by mouth daily.   famotidine 20 MG tablet Commonly known as: PEPCID Take 20 mg by mouth  2 (two) times daily.   ferrous sulfate 325 (65 FE) MG tablet Take 325 mg by mouth daily.   folic acid 1 MG tablet Commonly known as: FOLVITE Take 1 mg by mouth daily.   gabapentin 800 MG tablet Commonly known as: NEURONTIN Take 800 mg by mouth 2 (two) times daily.   HYDROcodone-acetaminophen 7.5-325 MG tablet Commonly known as: NORCO Take 1-2 tablets by mouth every 4 (four) hours as needed for severe pain (pain score 7-10).   isosorbide mononitrate 30 MG 24 hr tablet Commonly known as: IMDUR Take 30 mg by mouth daily.   levothyroxine 75 MCG tablet Commonly known as: SYNTHROID Take 75 mcg by mouth daily before breakfast.   LORazepam 0.5 MG tablet Commonly known as: ATIVAN Take 0.5 mg by mouth at bedtime.   methocarbamol 500 MG tablet Commonly known as: ROBAXIN Take 1 tablet (500 mg total) by mouth every 6 (six) hours as needed for muscle spasms.   metoprolol succinate 25 MG 24 hr tablet Commonly known as: TOPROL-XL Take 25 mg by mouth daily.   OLANZapine 15 MG tablet Commonly known as: ZYPREXA Take 1 tablet (15 mg total) by mouth at bedtime.   ondansetron 4 MG tablet Commonly known as: ZOFRAN Take 1 tablet (4 mg total) by mouth every 6 (six) hours as needed for nausea. What changed:  when  to take this reasons to take this   pantoprazole 20 MG tablet Commonly known as: PROTONIX Take 20 mg by mouth daily.   traMADol 50 MG tablet Commonly known as: ULTRAM Take 1 tablet (50 mg total) by mouth every 6 (six) hours.   traZODone 100 MG tablet Commonly known as: DESYREL Take 125 mg by mouth at bedtime.               Durable Medical Equipment  (From admission, onward)           Start     Ordered   10/25/20 1410  DME Walker rolling  Once       Question Answer Comment  Walker: With 5 Inch Wheels   Patient needs a walker to treat with the following condition S/P TKR (total knee replacement) using cement, left      10/25/20 1409   10/25/20 1410  DME 3 n 1  Once        10/25/20 1409   10/25/20 1410  DME Bedside commode  Once       Question:  Patient needs a bedside commode to treat with the following condition  Answer:  S/P TKR (total knee replacement) using cement, left   10/25/20 1409            Diagnostic Studies: DG Knee 1-2 Views Left  Result Date: 10/25/2020 CLINICAL DATA:  Postop left knee. EXAM: LEFT KNEE - 1-2 VIEW FINDINGS: Total left knee replacement. Hardware intact. Anatomic alignment. No acute abnormality. IMPRESSION: Total left knee replacement.  Hardware intact.  Anatomic alignment. Electronically Signed   By: [place]   On: 10/25/2020 13:24    Disposition:      Follow-up Information     Duanne Guess, PA-C Follow up in 2 week(s).   Specialties: Orthopedic Surgery, Emergency Medicine Contact information: [name] [address] [phone]                  Signed: Feliberto Gottron 10/27/2020, 8:55 AM

## 2020-10-27 NOTE — Discharge Instructions (Signed)

## 2020-10-27 NOTE — Progress Notes (Signed)
Physical Therapy Treatment Patient Details Name: Shelly Freeman MRN: [id number] DOB: 06/04/1950 Today's Date: 10/27/2020    History of Present Illness Patient is a 70 year old female with primary localized osteoarthritis of left knee s/p TKA. Past medical history significant for arthritis, systemic lupus erythematosus, neuromuscular disorder, HTN, gastric bypass surgery, CKD,    PT Comments    Patient is more alert this afternoon compared to this morning. Patient complaining of feeling cold and is agreeable to get back to bed. Patient needs +2 person for sit to stand transfer using rolling walker with maximal cues for technique. Unable to weight shift for ambulation or pivot attempts as patient limited by pain and anxiety. Cues for breathing and relaxation. Patient required +3 person assistance for squat pivot/lateral scoot transfer from recliner chair ( with drop arm down) to bed going towards left side. Patient making slow progress overall. Highly recommend SNF placement at discharge and continued PT to maximize independence and decrease caregiver burden.    Follow Up Recommendations  SNF     Equipment Recommendations  None recommended by PT    Recommendations for Other Services       Precautions / Restrictions Precautions Precautions: Knee Precaution Booklet Issued: Yes (comment) Precaution Comments: handout provided in Spanish Restrictions Weight Bearing Restrictions: Yes LLE Weight Bearing: Weight bearing as tolerated    Mobility  Bed Mobility Overal bed mobility: Needs Assistance Bed Mobility: Sit to Supine       Sit to supine: +2 for physical assistance;Max assist   General bed mobility comments: verbal cues for technique and task initiation. patient was able to initiate LE movement, however needs significant assistance with bed mobility. limited by pain, anxiety    Transfers Overall transfer level: Needs assistance Equipment used: Rolling walker (2  wheeled) Transfers: Sit to/from Shelly Freeman Sit to Stand: Max assist;+2 physical assistance   Squat pivot transfers: Total assist (+3 person assistance)     General transfer comment: 2 transfers performed. sit to stand performed from recliner chair with Max A +2 person and multi-modal cues for technique and safety. patient was unable to weight shift for safe pivot transfer and was unable to ambualte at this time. squat pivot/lateral scoot transfer performed from recliner to bed with drop arm down going towards left side with +3 person assistance. maximal cues for positioning and technique.  Ambulation/Gait             General Gait Details: unable to ambulate at this time   Stairs             Wheelchair Mobility    Modified Rankin (Stroke Patients Only)       Balance                                            Cognition Arousal/Alertness: Awake/alert Behavior During Therapy: Anxious Overall Cognitive Status: Within Functional Limits for tasks assessed                                 General Comments: Ipad Spanish interpreter used ID# [id number]      Exercises Total Joint Exercises Ankle Circles/Pumps: AROM;Strengthening;Left;10 reps;Seated Quad Sets:  (tactile and verbal cues for technique for quad set, unable to perform. question if patient providing full effort with exercise attempts) Hip ABduction/ADduction: AAROM;Strengthening;Left;10 reps;Seated Straight  Leg Raises: AAROM;Strengthening;Left;10 reps;Seated Long Arc Quad: AAROM;Strengthening;Left;10 reps;Seated Goniometric ROM: left knee 8-68 degrees (limited by pain, operative site guarding) Other Exercises Other Exercises: maximal encouragement provided for participation with LE exercises.    General Comments        Pertinent Vitals/Pain Pain Assessment: 0-10 Pain Score: 9  Pain Location: left knee Pain Descriptors / Indicators:  Grimacing;Guarding;Moaning Pain Intervention(s): Limited activity within patient's tolerance (polar care re-applied at end of session)    Home Living                      Prior Function            PT Goals (current goals can now be found in the care plan section) Acute Rehab PT Goals Patient Stated Goal: to have less pain PT Goal Formulation: With patient Time For Goal Achievement: 11/08/20 Potential to Achieve Goals: Fair Progress towards PT goals: Progressing toward goals    Frequency    BID      PT Plan Current plan remains appropriate    Co-evaluation              AM-PAC PT "6 Clicks" Mobility   Outcome Measure  Help needed turning from your back to your side while in a flat bed without using bedrails?: A Lot Help needed moving from lying on your back to sitting on the side of a flat bed without using bedrails?: A Lot Help needed moving to and from a bed to a chair (including a wheelchair)?: Total Help needed standing up from a chair using your arms (e.g., wheelchair or bedside chair)?: Total Help needed to walk in hospital room?: Total Help needed climbing 3-5 steps with a railing? : Total 6 Click Score: 8    End of Session Equipment Utilized During Treatment: Gait belt Activity Tolerance: Patient limited by pain Patient left: in bed;with call bell/phone within reach;with bed alarm set;with SCD's reapplied (polar care re-applied, towel roll also applied to promote knee extension and to elevate heel from bed LLE) Nurse Communication: Mobility status PT Visit Diagnosis: Pain;Difficulty in walking, not elsewhere classified (R26.2);Muscle weakness (generalized) (M62.81) Pain - Right/Left: Left Pain - part of body: Knee     Time: 9021-1155 PT Time Calculation (min) (ACUTE ONLY): 28 min  Charges:  $Therapeutic Exercise: 8-22 mins $Therapeutic Activity: 8-22 mins                     Minna Merritts, PT, MPT    [name] 10/27/2020, 3:17  PM

## 2020-10-27 NOTE — Evaluation (Addendum)
Occupational Therapy Treatment Patient Details Name: Shelly Freeman MRN: [id number] DOB: 03-Oct-1950 Today's Date: 10/27/2020    History of Present Illness Patient is a 70 year old female with primary localized osteoarthritis of left knee s/p TKA. Past medical history significant for arthritis, systemic lupus erythematosus, neuromuscular disorder, HTN, gastric bypass surgery, CKD,   Clinical Impression   Pt seen for OT tx this date to f/u re: safety with ADLs/ADL mobility. Pt with c/o pain this AM despite having received pain medication. Pt requires MOD A +2 for sup to sit transition. Demos decreased sitting tolerance and balance requiring MIN A to sustain static sitting while OT engages pt in UB bathing tasks. Pt requires MAX A +2 for STS trials x2 with 2WW. Requires MAX A +2 for LB bathing in standing and to finally SPS from EOB sitting to chair. Limited ability to clear bottom 2/2 pain requiring extensive assist. Pt noted to have decreased tolerance versus yesterday and is primarily limited by pain. Will continue to follow acutely. Continue to anticipate that pt will require STR upon d/c from hospital.     Follow Up Recommendations  SNF    Equipment Recommendations  Other (comment) (defer)    Recommendations for Other Services       Precautions / Restrictions Precautions Precautions: Knee Restrictions Weight Bearing Restrictions: Yes LLE Weight Bearing: Weight bearing as tolerated      Mobility Bed Mobility Overal bed mobility: Needs Assistance Bed Mobility: Supine to Sit     Supine to sit: Mod assist;+2 for physical assistance     General bed mobility comments: increased time and encouragement to come to EOB sitting    Transfers Overall transfer level: Needs assistance Equipment used: Rolling walker (2 wheeled) Transfers: Sit to/from [facility] Sit to Stand: Max assist;+2 physical assistance Stand pivot transfers: Max assist;+2 physical assistance        General transfer comment: increased time, assist and encouragement to CTS this date. Also, EOB elevated, MOD vcueing for hand placement with use of RW and to lean FWD to gain momentum with coming to stand. Pt unable to tolerate taking steps d/t pain and after 2 very short standing bouts, requires MAX A +2 to SPS from EOB to chair. RN/PT notified of assist level increased today, likely 2/2 pain    Balance Overall balance assessment: Needs assistance Sitting-balance support: Bilateral upper extremity supported;Feet supported Sitting balance-Leahy Scale: Poor     Standing balance support: Bilateral upper extremity supported;During functional activity Standing balance-Leahy Scale: Zero Standing balance comment: MAX A +2 today 2/2 pain                           ADL either performed or assessed with clinical judgement   ADL Overall ADL's : Needs assistance/impaired     Grooming: Wash/dry hands;Wash/dry face;Set up;Min guard;Sitting Grooming Details (indicate cue type and reason): CGA intermittently for sitting balance as pt demos P to F static sitting this date Upper Body Bathing: Minimal assistance;Moderate assistance;Sitting Upper Body Bathing Details (indicate cue type and reason): MIN/MOD A in static EOB sitting primarily as pt demos decreased static sitting balance this date and also requires assist for thorough completion of washing/drying under skin folds. Lower Body Bathing: Maximal assistance;Total assistance;+2 for physical assistance;Sit to/from stand Lower Body Bathing Details (indicate cue type and reason): with RW for UE support in standing, takes 2 standing trials to complete as pt with very limited standing tolerance this date. Upper  Body Dressing : Min guard;Sitting   Lower Body Dressing: Maximal assistance;Total assistance;Sitting/lateral leans Lower Body Dressing Details (indicate cue type and reason): to don clean socks in EOB sitting                      Vision Patient Visual Report: No change from baseline       Perception     Praxis      Pertinent Vitals/Pain Pain Assessment: 0-10 Pain Score: 10-Worst pain ever Pain Location: left knee Pain Descriptors / Indicators: Grimacing;Guarding;Moaning Pain Intervention(s): Limited activity within patient's tolerance;Monitored during session;Premedicated before session;Patient requesting pain meds-RN notified;RN gave pain meds during session (pt recieved non-opiod medication before, and opiod during session as she was still c/o severe pain.)     Hand Dominance     Extremity/Trunk Assessment             Communication     Cognition Arousal/Alertness: Awake/alert Behavior During Therapy: WFL for tasks assessed/performed Overall Cognitive Status: Within Functional Limits for tasks assessed                                 General Comments: used spanish interpreter ipad: [employer]. [place], ID #: [id number]   General Comments       Exercises Other Exercises Other Exercises: OT engages pt in seated UB bathing/dressing and SPS with MAX A +2 from EOB to chair   Shoulder Instructions      Home Living                                          Prior Functioning/Environment                   OT Problem List: Decreased strength;Decreased activity tolerance      OT Treatment/Interventions: Self-care/ADL training;DME and/or AE instruction;Therapeutic activities;Balance training;Therapeutic exercise;Energy conservation;Patient/family education    OT Goals(Current goals can be found in the care plan section) Acute Rehab OT Goals Patient Stated Goal: to go to rehab OT Goal Formulation: With patient Time For Goal Achievement: 11/09/20 Potential to Achieve Goals: Good  OT Frequency: Min 2X/week   Barriers to D/C:            Co-evaluation              AM-PAC OT "6 Clicks" Daily Activity     Outcome Measure Help from another  person eating meals?: A Little Help from another person taking care of personal grooming?: A Little Help from another person toileting, which includes using toliet, bedpan, or urinal?: Total Help from another person bathing (including washing, rinsing, drying)?: Total Help from another person to put on and taking off regular upper body clothing?: A Little Help from another person to put on and taking off regular lower body clothing?: Total 6 Click Score: 12   End of Session Equipment Utilized During Treatment: Gait belt;Rolling walker Nurse Communication: Mobility status  Activity Tolerance: Patient limited by pain Patient left: with call bell/phone within reach;in chair;with chair alarm set;with nursing/sitter in room  OT Visit Diagnosis: Unsteadiness on feet (R26.81);Muscle weakness (generalized) (M62.81);Pain Pain - Right/Left: Left Pain - part of body: Knee                Time: 7782-4235 OT Time Calculation (min): 47 min Charges:  OT  General Charges $OT Visit: 1 Visit OT Treatments $Self Care/Home Management : 23-37 mins $Therapeutic Activity: 8-22 mins  Gerrianne Scale, MS, OTR/L ascom [phone] 10/27/20, 12:13 PM

## 2020-10-27 NOTE — Progress Notes (Signed)
Physical Therapy Treatment Patient Details Name: Shelly Freeman MRN: [id number] DOB: [birth year] Today's Date: 10/27/2020    History of Present Illness Patient is a 70 year old female with primary localized osteoarthritis of left knee s/p TKA. Past medical history significant for arthritis, systemic lupus erythematosus, neuromuscular disorder, HTN, gastric bypass surgery, CKD,    PT Comments    Patient lethargic initially, sitting up in chair on arrival to room. Patient groggy and asks what hospital she is in. Re-oriented easily. Patient was unable to stand despite maximal assistance, extra time, and multi-modal cueing. Patient needs encouragement to participate with LE exercises and seems limited by pain despite having been pre-medicated. Recommend to continue PT to maximize independence and decrease caregiver burden. SNF will be needed at discharge.    Follow Up Recommendations  SNF     Equipment Recommendations     None recommended at this time   Recommendations for Other Services       Precautions / Restrictions Precautions Precautions: Knee Precaution Booklet Issued: Yes (comment) Precaution Comments: handout provided in Spanish Restrictions Weight Bearing Restrictions: Yes LLE Weight Bearing: Weight bearing as tolerated    Mobility  Bed Mobility Overal bed mobility: Needs Assistance Bed Mobility: Supine to Sit     Supine to sit: Mod assist;+2 for physical assistance     General bed mobility comments: not addressed this session    Transfers Overall transfer level: Needs assistance Equipment used: Rolling walker (2 wheeled) Transfers: Sit to/from [facility] Sit to Stand: Max assist;+2 physical assistance Stand pivot transfers: Max assist;+2 physical assistance       General transfer comment: attempted sit to stand transfer with Max A of one person. patient was unable to stand despite extra time, hand over hand cueing, verbal cues for  technique. anterior weight shifting assistance and lift off assistance provided. unable to stand at this time. limited by fatigue, pain, lethargy  Ambulation/Gait                 Stairs             Wheelchair Mobility    Modified Rankin (Stroke Patients Only)       Balance Overall balance assessment: Needs assistance Sitting-balance support: Bilateral upper extremity supported;Feet supported Sitting balance-Leahy Scale: Poor     Standing balance support: Bilateral upper extremity supported;During functional activity Standing balance-Leahy Scale: Zero Standing balance comment: MAX A +2 today 2/2 pain                            Cognition Arousal/Alertness: Lethargic Behavior During Therapy: WFL for tasks assessed/performed Overall Cognitive Status: Within Functional Limits for tasks assessed                                 General Comments: Ipad Spanish interpreter used, ID [id number]      Exercises Total Joint Exercises Ankle Circles/Pumps: AROM;Strengthening;Left;10 reps;Seated Quad Sets:  (tactile and verbal cues for technique for quad set, unable to perform. question if patient providing full effort with exercise attempts) Hip ABduction/ADduction: AAROM;Strengthening;Left;10 reps;Seated Straight Leg Raises: AAROM;Strengthening;Left;10 reps;Seated Long Arc Quad: AAROM;Strengthening;Left;10 reps;Seated Goniometric ROM: left knee 8-68 degrees (limited by pain, operative site guarding) Other Exercises Other Exercises: maximal encouragement provided for participation with LE exercises.    General Comments        Pertinent Vitals/Pain Pain Assessment: 0-10 Pain Score: 8  Pain Location: left knee Pain Descriptors / Indicators: Grimacing;Guarding;Moaning Pain Intervention(s): Limited activity within patient's tolerance;Monitored during session;RN gave pain meds during session    Home Living                      Prior  Function            PT Goals (current goals can now be found in the care plan section) Acute Rehab PT Goals Patient Stated Goal: to go to rehab PT Goal Formulation: With patient Time For Goal Achievement: 11/08/20 Potential to Achieve Goals: Fair Progress towards PT goals: Progressing toward goals    Frequency    BID      PT Plan Current plan remains appropriate    Co-evaluation              AM-PAC PT "6 Clicks" Mobility   Outcome Measure  Help needed turning from your back to your side while in a flat bed without using bedrails?: A Lot Help needed moving from lying on your back to sitting on the side of a flat bed without using bedrails?: A Lot Help needed moving to and from a bed to a chair (including a wheelchair)?: Total Help needed standing up from a chair using your arms (e.g., wheelchair or bedside chair)?: Total Help needed to walk in hospital room?: Total Help needed climbing 3-5 steps with a railing? : Total 6 Click Score: 8    End of Session Equipment Utilized During Treatment: Gait belt Activity Tolerance: Patient limited by pain;Patient limited by lethargy Patient left: in chair;with call bell/phone within reach;with chair alarm set Nurse Communication: Mobility status PT Visit Diagnosis: Pain;Difficulty in walking, not elsewhere classified (R26.2);Muscle weakness (generalized) (M62.81) Pain - Right/Left: Left Pain - part of body: Knee     Time: 6767-2094 PT Time Calculation (min) (ACUTE ONLY): 30 min  Charges:  $Therapeutic Exercise: 8-22 mins $Therapeutic Activity: 8-22 mins                    Minna Merritts, PT, MPT    [name] 10/27/2020, 1:47 PM

## 2020-10-27 NOTE — Progress Notes (Signed)
   Subjective: 2 Days Post-Op Procedure(s) (LRB): TOTAL KNEE ARTHROPLASTY (Left) Patient reports pain as 10 on 0-10 scale.   Patient is well, and has had no acute complaints or problems Denies any SOB, ABD pain. VSS. Patient has taken very little norco, last dose 9:30 pm last night. We will continue therapy today. Plan is to go to SNF  Objective: Vital signs in last 24 hours: Temp:  [98 F (36.7 C)-99.7 F (37.6 C)] 98.2 F (36.8 C) (06/23 0758) Pulse Rate:  [62-89] 83 (06/23 0758) Resp:  [16-20] 18 (06/23 0758) BP: (108-158)/(44-144) 121/46 (06/23 0758) SpO2:  [90 %-97 %] 94 % (06/23 0758)  Intake/Output from previous day: 06/22 0701 - 06/23 0700 In: 600 [P.O.:600] Out: 1250 [Urine:1250] Intake/Output this shift: No intake/output data recorded.  Recent Labs    10/25/20 1439 10/26/20 0701  HGB 13.7 10.8*   Recent Labs    10/25/20 1439 10/26/20 0701  WBC 6.9 9.0  RBC 4.24 3.34*  HCT 42.7 33.8*  PLT 163 153   Recent Labs    10/25/20 1439 10/26/20 0701  NA  --  141  K  --  4.2  CL  --  107  CO2  --  30  BUN  --  8  CREATININE 0.63 0.63  GLUCOSE  --  114*  CALCIUM  --  8.4*   No results for input(s): LABPT, INR in the last 72 hours.  EXAM General - Patient is Alert, Appropriate, and Oriented Extremity - Neurovascular intact Sensation intact distally Intact pulses distally Dorsiflexion/Plantar flexion intact No cellulitis present Compartment soft Dressing - dressing C/D/I and no drainage, prevena intact with out drainage Motor Function - intact, moving foot and toes well on exam.   Past Medical History:  Diagnosis Date   Arthritis    Collagen vascular disease (HCC)    COPD (chronic obstructive pulmonary disease) (HCC)    DDD (degenerative disc disease), lumbar 05/14/2016   Depression    Dizziness    Dyspnea    GERD (gastroesophageal reflux disease)    Heart valve problem    per grandaughter need surgery but they told her may not survive;    History of kidney stones    Hypertension    Hypothyroidism    Lupus ([place])    Neuromuscular disorder ([place])    Sleep apnea    no CPAP   Thyroid disease    Vitamin D deficiency 05/14/2016    Assessment/Plan:   2 Days Post-Op Procedure(s) (LRB): TOTAL KNEE ARTHROPLASTY (Left) Active Problems:   S/P TKR (total knee replacement) using cement, left  Estimated body mass index is 44.21 kg/m as calculated from the following:   Height as of this encounter: 5\' 1"  (1.549 m).   Weight as of this encounter: 106.1 kg. Advance diet Up with therapy Work on pain control, continue with current pain regimen. No norco since 9:30 last night. Only 3 doses of norco given yesterday. VSS Work on BM CM to assist with discharge to SNF  DVT Prophylaxis - Lovenox, Foot Pumps, and TED hose Weight-Bearing as tolerated to left leg   T. Rachelle Hora, PA-C [place] 10/27/2020, 9:06 AM

## 2020-10-27 NOTE — Progress Notes (Signed)
Pt provided discharge instructions with use of interpreter services. 2 fresh honeycomb dressings sent with pt. Pt switched to prevena wound vac and instructed on use. Facility called by this RN and report given to [employer], Therapist, sports. Prescriptions sent to facility. Pt transported via EMS services.   10/27/20 1653  Vitals  Temp 99.1 F (37.3 C)  Temp Source Oral  BP (!) 123/59  MAP (mmHg) 77  BP Method Automatic  Pulse Rate (!) 102  Pulse Rate Source Monitor  Level of Consciousness  Level of Consciousness Alert  MEWS COLOR  MEWS Score Color Green  Oxygen Therapy  SpO2 98 %  O2 Device Room [employer]

## 2020-10-27 NOTE — TOC Progression Note (Signed)
Transition of Care [facility]) - Progression Note    Patient Details  Name: Shelly Freeman MRN: [id number] Date of Birth: 1951-04-20  Transition of [facility]) CM/SW Contact  Su Hilt, RN Phone Number: 10/27/2020, 4:15 PM  Clinical Narrative:    [facility] to arrange transport to [place] she  Is a [facility] patient, she is next on the list to be picked up       Expected Discharge Plan and Services           Expected Discharge Date: 10/27/20                                     Social Determinants of Health (SDOH) Interventions    Readmission Risk Interventions Readmission Risk Prevention Plan 01/30/2019  Post Dischage Appt Complete  Medication Screening Complete  Transportation Screening Complete  Some recent data might be hidden

## 2020-12-21 ENCOUNTER — Other Ambulatory Visit: Payer: Self-pay

## 2020-12-21 ENCOUNTER — Emergency Department: Payer: Medicare (Managed Care)

## 2020-12-21 ENCOUNTER — Emergency Department
Admission: EM | Admit: 2020-12-21 | Discharge: 2020-12-21 | Disposition: A | Discharge status: Home / Self Care | Payer: Medicare (Managed Care) | Attending: Emergency Medicine | Admitting: Emergency Medicine

## 2020-12-21 DIAGNOSIS — S300XXA Contusion of lower back and pelvis, initial encounter: Secondary | ICD-10-CM

## 2020-12-21 DIAGNOSIS — I10 Essential (primary) hypertension: Secondary | ICD-10-CM | POA: Insufficient documentation

## 2020-12-21 DIAGNOSIS — W19XXXA Unspecified fall, initial encounter: Secondary | ICD-10-CM

## 2020-12-21 DIAGNOSIS — Z79899 Other long term (current) drug therapy: Secondary | ICD-10-CM | POA: Insufficient documentation

## 2020-12-21 DIAGNOSIS — Y92002 Bathroom of unspecified non-institutional (private) residence single-family (private) house as the place of occurrence of the external cause: Secondary | ICD-10-CM | POA: Diagnosis not present

## 2020-12-21 DIAGNOSIS — S0990XA Unspecified injury of head, initial encounter: Secondary | ICD-10-CM

## 2020-12-21 DIAGNOSIS — Z96652 Presence of left artificial knee joint: Secondary | ICD-10-CM | POA: Insufficient documentation

## 2020-12-21 DIAGNOSIS — E039 Hypothyroidism, unspecified: Secondary | ICD-10-CM | POA: Diagnosis not present

## 2020-12-21 DIAGNOSIS — J449 Chronic obstructive pulmonary disease, unspecified: Secondary | ICD-10-CM | POA: Insufficient documentation

## 2020-12-21 DIAGNOSIS — S3992XA Unspecified injury of lower back, initial encounter: Secondary | ICD-10-CM | POA: Diagnosis present

## 2020-12-21 MED ORDER — ONDANSETRON 4 MG PO TBDP
4.0000 mg | ORAL_TABLET | Freq: Once | ORAL | Status: AC
  Administered 2020-12-21: 4 mg via ORAL
  Filled 2020-12-21: qty 1

## 2020-12-21 MED ORDER — HYDROCODONE-ACETAMINOPHEN 5-325 MG PO TABS
1.0000 | ORAL_TABLET | Freq: Once | ORAL | Status: AC
  Administered 2020-12-21: 1 via ORAL
  Filled 2020-12-21: qty 1

## 2020-12-21 MED ORDER — TRAMADOL HCL 50 MG PO TABS: 50.0000 mg | ORAL_TABLET | Freq: Four times a day (QID) | ORAL | 0 refills | Status: DC | PRN

## 2020-12-21 NOTE — Discharge Instructions (Addendum)
Follow up with your regular doctor, do not mix the hydrocodone with the tramadol.  Take tylenol or ibuprofen, apply ice to any areas that hurt Your ct of head is negative, your ct of cspine is negative, xray of the lumbar spine and left knee are negative

## 2020-12-21 NOTE — ED Triage Notes (Signed)
Pt to ER via [facility] with complaints of left knee pain after a mechanical fall this morning in her bathroom, she reports reaching back to her sink and falling backwards. Reports hitting the back of her head. Denies LOC/ denies blood thinner usage. Pt reports knee surgery July 7th 2022.    Triage completed using interpreter [phone]

## 2020-12-21 NOTE — ED Provider Notes (Signed)
[facility] Emergency Department Provider Note  ____________________________________________   Event Date/Time   First MD Initiated Contact with Patient 12/21/20 [phone]     (approximate)  I have reviewed the triage vital signs and the nursing notes.   HISTORY  Chief Complaint Fall    HPI Shelly Freeman is a 70 y.o. female presents emergency department complaining of a fall.  Patient hit her head when she fell in the bathroom this morning, feels like she twisted her knee that she recently had replaced, and has low back pain after falling backwards.  No LOC.  Some nausea but no vomiting.  Past Medical History:  Diagnosis Date   Arthritis    Collagen vascular disease ([place])    COPD (chronic obstructive pulmonary disease) (HCC)    DDD (degenerative disc disease), lumbar 05/14/2016   Depression    Dizziness    Dyspnea    GERD (gastroesophageal reflux disease)    Heart valve problem    per grandaughter need surgery but they told her may not survive;   History of kidney stones    Hypertension    Hypothyroidism    Lupus ([place])    Neuromuscular disorder ([place])    Sleep apnea    no CPAP   Thyroid disease    Vitamin D deficiency 05/14/2016    Patient Active Problem List   Diagnosis Date Noted   S/P TKR (total knee replacement) using cement, 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   Bilateral pneumonia 08/29/2020   AKI (acute kidney injury) ([place]) 08/29/2020   COPD (chronic obstructive pulmonary disease) ([place]) 08/29/2020   Fall at home, initial encounter 08/29/2020   CAP (community acquired pneumonia) 08/29/2020   Obesity, Class III, BMI 40-49.9 (morbid obesity) ([place]) 05/23/2019   UTI (urinary tract infection) 05/23/2019   Right rib fracture 05/23/2019   SIRS (systemic inflammatory response syndrome) ([place]) 05/23/2019   Bacteremia due to Gram-negative bacteria 05/23/2019   Acute bronchiolitis 01/28/2019   Left anterior knee pain 10/24/2016   Right upper quadrant pain  10/24/2016   Urination pain 06/26/2016   DDD (degenerative disc disease), lumbar 05/14/2016   Lipoma of skin 05/14/2016   Vitamin D deficiency, unspecified 05/14/2016   Major neurocognitive disorder, due to vascular disease, with behavioral disturbance, mild ([place]) 04/15/2016   Severe recurrent major depression with psychotic features ([place]) 04/04/2016   Lupus ([place]) 04/04/2016   GERD (gastroesophageal reflux disease) 04/04/2016   COPD with acute bronchitis ([place]) 04/04/2016   Acute respiratory failure with hypoxia ([place]) 04/03/2016   Encephalopathy, metabolic 0000000   Hypokalemia 04/03/2016   Pain in limb 04/03/2016   Hypothyroidism 04/03/2016   Essential hypertension 04/03/2016   Intentional opiate overdose ([place]) 03/30/2016   Age related osteoporosis 03/19/2016   Chronic midline low back pain without sciatica 03/19/2016   Encounter for long-term (current) use of high-risk medication 03/19/2016   Primary osteoarthritis of both knees 03/19/2016    Past Surgical History:  Procedure Laterality Date   APPENDECTOMY     BREAST BIOPSY Left    neg   CESAREAN SECTION     COLONOSCOPY WITH PROPOFOL N/A 07/01/2017   Procedure: COLONOSCOPY WITH PROPOFOL;  Surgeon: Manya Silvas, MD;  Location[facility] ENDOSCOPY;  Service: Endoscopy;  Laterality: N/A;   ESOPHAGOGASTRODUODENOSCOPY (EGD) WITH PROPOFOL N/A 07/01/2017   Procedure: ESOPHAGOGASTRODUODENOSCOPY (EGD) WITH PROPOFOL;  Surgeon: Manya Silvas, MD;  Location: [facility] ENDOSCOPY;  Service: Endoscopy;  Laterality: N/A;   GASTRIC BYPASS     LITHOTRIPSY     TOTAL KNEE  ARTHROPLASTY Left 10/25/2020   Procedure: TOTAL KNEE ARTHROPLASTY;  Surgeon: Hessie Knows, MD;  Location: [facility] ORS;  Service: Orthopedics;  Laterality: Left;   TUBAL LIGATION      Prior to Admission medications   Medication Sig Start Date End Date Taking? Authorizing Provider  traMADol (ULTRAM) 50 MG tablet Take 1 tablet (50 mg total) by mouth every 6 (six) hours as needed.  12/21/20  Yes Avyn Aden, Linden Dolin, PA-C  alendronate (FOSAMAX) 70 MG tablet Take 70 mg by mouth once a week. Take with a full glass of water on an empty stomach.    [provider]  celecoxib (CELEBREX) 200 MG capsule Take 200 mg by mouth daily.    [provider]  docusate sodium (COLACE) 100 MG capsule Take 1 capsule (100 mg total) by mouth 2 (two) times daily. 10/27/20   Duanne Guess, PA-C  enoxaparin (LOVENOX) 40 MG/0.4ML injection Inject 0.4 mLs (40 mg total) into the skin daily for 14 days. 10/27/20 11/10/20  Duanne Guess, PA-C  escitalopram (LEXAPRO) 20 MG tablet Take 20 mg by mouth daily.    [provider]  famotidine (PEPCID) 20 MG tablet Take 20 mg by mouth 2 (two) times daily.    [provider]  ferrous sulfate 325 (65 FE) MG tablet Take 325 mg by mouth daily.    [provider]  folic acid (FOLVITE) 1 MG tablet Take 1 mg by mouth daily.    [provider]  gabapentin (NEURONTIN) 800 MG tablet Take 800 mg by mouth 2 (two) times daily.    [provider]  HYDROcodone-acetaminophen (NORCO) 7.5-325 MG tablet Take 1-2 tablets by mouth every 4 (four) hours as needed for severe pain (pain score 7-10). 10/27/20   Duanne Guess, PA-C  isosorbide mononitrate (IMDUR) 30 MG 24 hr tablet Take 30 mg by mouth daily.    [provider]  levothyroxine (SYNTHROID, LEVOTHROID) 75 MCG tablet Take 75 mcg by mouth daily before breakfast.    [provider]  LORazepam (ATIVAN) 0.5 MG tablet Take 0.5 mg by mouth at bedtime.    [provider]  methocarbamol (ROBAXIN) 500 MG tablet Take 1 tablet (500 mg total) by mouth every 6 (six) hours as needed for muscle spasms. 10/27/20   Duanne Guess, PA-C  metoprolol succinate (TOPROL-XL) 25 MG 24 hr tablet Take 25 mg by mouth daily.     [provider]  OLANZapine (ZYPREXA) 15 MG tablet Take 1 tablet (15 mg total) by mouth at bedtime. 04/16/16   Pucilowska, Jolanta B,  MD  ondansetron (ZOFRAN) 4 MG tablet Take 1 tablet (4 mg total) by mouth every 6 (six) hours as needed for nausea. 10/27/20   Duanne Guess, PA-C  pantoprazole (PROTONIX) 20 MG tablet Take 20 mg by mouth daily.    [provider]  traZODone (DESYREL) 100 MG tablet Take 125 mg by mouth at bedtime.    [provider]    Allergies Patient has no known allergies.  Family History  Problem Relation Age of Onset   Breast cancer Maternal Aunt    Breast cancer Cousin     Social History Social History   Tobacco Use   Smoking status: Never   Smokeless tobacco: Never  Vaping Use   Vaping Use: Never used  Substance Use Topics   Alcohol use: No   Drug use: No    Review of Systems  Constitutional: No fever/chills Eyes: No visual changes. ENT: No  sore throat. Respiratory: Denies cough Cardiovascular: Denies chest pain Gastrointestinal: Denies abdominal pain Genitourinary: Negative for dysuria. Musculoskeletal: Positive for back pain. Skin: Negative for rash. Psychiatric: no mood changes,     ____________________________________________   PHYSICAL EXAM:  VITAL SIGNS: ED Triage Vitals  Enc Vitals Group     BP 12/21/20 0710 (!) 111/52     Pulse Rate 12/21/20 0710 78     Resp 12/21/20 0710 18     Temp 12/21/20 0710 98.3 F (36.8 C)     Temp Source 12/21/20 0710 Oral     SpO2 12/21/20 0710 96 %     Weight 12/21/20 0708 232 lb (105.2 kg)     Height 12/21/20 0708 '5\' 1"'$  (1.549 m)     Head Circumference --      Peak Flow --      Pain Score 12/21/20 0708 6     Pain Loc --      Pain Edu? --      Excl. in [place]? --     Constitutional: Alert and oriented. Well appearing and in no acute distress. Eyes: Conjunctivae are normal.  Head: Tender posteriorly, no open wounds Nose: No congestion/rhinnorhea. Mouth/Throat: Mucous membranes are moist.   Neck:  supple no lymphadenopathy noted Cardiovascular: Normal rate, regular rhythm.  Respiratory: Normal  respiratory effort.  No retractions, GU: deferred Musculoskeletal: Lumbar spine is tender, C-spine is tender, left knee is warm and tender, patient is unable to stand at this time secondary discomfort, has difficulty moving from lying to sitting Neurologic:  Normal speech and language.  Skin:  Skin is warm, dry and intact. No rash noted. Psychiatric: Mood and affect are normal. Speech and behavior are normal.  ____________________________________________   LABS (all labs ordered are listed, but only abnormal results are displayed)  Labs Reviewed - No data to display ____________________________________________   ____________________________________________  RADIOLOGY  CT of the head and C-spine X-ray lumbar spine and left knee  ____________________________________________   PROCEDURES  Procedure(s) performed: No  Procedures    ____________________________________________   INITIAL IMPRESSION / ASSESSMENT AND PLAN / ED COURSE  Pertinent labs & imaging results that were available during my care of the patient were reviewed by me and considered in my medical decision making (see chart for details).   Patient is a 70 year old female presents emergency department after a fall.  See HPI.  Physical exam shows patient be stable at this time.  Did sustain head injury along with back injury during the fall.  We will do CT of the head and C-spine, x-ray of the lumbar spine and left knee  CT of the head and C-spine reviewed by me confirmed by radiology to be negative X-ray of the lumbar spine and left knee are negative for any acute abnormalities  I did use the [employer] interpreter to give the patient her results.  PDMP so states that she has had a lot of narcotics recently however I feel that this is mainly due to her knee replacement.  We will give her 10 tramadol for breakthrough pain.  She is being followed by [facility].  They are going to arrange transport and will be taking her to  her regular doctor.  She was discharged stable condition.  Shelly Freeman was evaluated in Emergency Department on 12/21/2020 for the symptoms described in the history of present illness. She was evaluated in the context of the global COVID-19 pandemic, which necessitated consideration that the patient might be at risk for infection with  the SARS-CoV-2 virus that causes COVID-19. Institutional protocols and algorithms that pertain to the evaluation of patients at risk for COVID-19 are in a state of rapid change based on information released by regulatory bodies including the CDC and federal and state organizations. These policies and algorithms were followed during the patient's care in the ED.    As part of my medical decision making, I reviewed the following data within the [place] notes reviewed and incorporated, Interpreter needed, Old chart reviewed, Radiograph reviewed , Notes from prior ED visits, and [place] Controlled Substance Database  ____________________________________________   FINAL CLINICAL IMPRESSION(S) / ED DIAGNOSES  Final diagnoses:  Fall, initial encounter  Minor head injury, initial encounter  Lumbar contusion, initial encounter      NEW MEDICATIONS STARTED DURING THIS VISIT:  New Prescriptions   TRAMADOL (ULTRAM) 50 MG TABLET    Take 1 tablet (50 mg total) by mouth every 6 (six) hours as needed.     Note:  This document was prepared using Dragon voice recognition software and may include unintentional dictation errors.    Versie Starks, PA-C 12/21/20 [id number]    Duffy Bruce, MD 12/22/20 [phone]

## 2021-01-25 ENCOUNTER — Inpatient Hospital Stay
Admission: EM | Admit: 2021-01-25 | Discharge: 2021-01-31 | DRG: 871 | Disposition: A | Discharge status: Skilled Nursing Facility | Payer: Medicare (Managed Care) | Attending: Internal Medicine | Admitting: Internal Medicine

## 2021-01-25 ENCOUNTER — Emergency Department: Payer: Medicare (Managed Care)

## 2021-01-25 DIAGNOSIS — G4733 Obstructive sleep apnea (adult) (pediatric): Secondary | ICD-10-CM | POA: Diagnosis present

## 2021-01-25 DIAGNOSIS — Z87442 Personal history of urinary calculi: Secondary | ICD-10-CM

## 2021-01-25 DIAGNOSIS — M79671 Pain in right foot: Secondary | ICD-10-CM | POA: Diagnosis present

## 2021-01-25 DIAGNOSIS — M1712 Unilateral primary osteoarthritis, left knee: Secondary | ICD-10-CM

## 2021-01-25 DIAGNOSIS — M329 Systemic lupus erythematosus, unspecified: Secondary | ICD-10-CM | POA: Diagnosis present

## 2021-01-25 DIAGNOSIS — J449 Chronic obstructive pulmonary disease, unspecified: Secondary | ICD-10-CM | POA: Diagnosis present

## 2021-01-25 DIAGNOSIS — K219 Gastro-esophageal reflux disease without esophagitis: Secondary | ICD-10-CM | POA: Diagnosis present

## 2021-01-25 DIAGNOSIS — I10 Essential (primary) hypertension: Secondary | ICD-10-CM | POA: Diagnosis present

## 2021-01-25 DIAGNOSIS — N390 Urinary tract infection, site not specified: Secondary | ICD-10-CM | POA: Diagnosis not present

## 2021-01-25 DIAGNOSIS — N12 Tubulo-interstitial nephritis, not specified as acute or chronic: Secondary | ICD-10-CM | POA: Diagnosis present

## 2021-01-25 DIAGNOSIS — A419 Sepsis, unspecified organism: Secondary | ICD-10-CM | POA: Diagnosis not present

## 2021-01-25 DIAGNOSIS — Z79890 Hormone replacement therapy (postmenopausal): Secondary | ICD-10-CM

## 2021-01-25 DIAGNOSIS — M549 Dorsalgia, unspecified: Secondary | ICD-10-CM | POA: Diagnosis present

## 2021-01-25 DIAGNOSIS — M25552 Pain in left hip: Secondary | ICD-10-CM | POA: Diagnosis present

## 2021-01-25 DIAGNOSIS — R6521 Severe sepsis with septic shock: Secondary | ICD-10-CM | POA: Diagnosis present

## 2021-01-25 DIAGNOSIS — Z803 Family history of malignant neoplasm of breast: Secondary | ICD-10-CM

## 2021-01-25 DIAGNOSIS — E039 Hypothyroidism, unspecified: Secondary | ICD-10-CM | POA: Diagnosis present

## 2021-01-25 DIAGNOSIS — F419 Anxiety disorder, unspecified: Secondary | ICD-10-CM | POA: Diagnosis present

## 2021-01-25 DIAGNOSIS — M542 Cervicalgia: Secondary | ICD-10-CM | POA: Diagnosis present

## 2021-01-25 DIAGNOSIS — F0390 Unspecified dementia without behavioral disturbance: Secondary | ICD-10-CM | POA: Diagnosis present

## 2021-01-25 DIAGNOSIS — Z6841 Body Mass Index (BMI) 40.0 and over, adult: Secondary | ICD-10-CM

## 2021-01-25 DIAGNOSIS — M79672 Pain in left foot: Secondary | ICD-10-CM | POA: Diagnosis present

## 2021-01-25 DIAGNOSIS — R296 Repeated falls: Secondary | ICD-10-CM | POA: Diagnosis present

## 2021-01-25 DIAGNOSIS — Z20822 Contact with and (suspected) exposure to covid-19: Secondary | ICD-10-CM | POA: Diagnosis present

## 2021-01-25 DIAGNOSIS — G8929 Other chronic pain: Secondary | ICD-10-CM | POA: Diagnosis present

## 2021-01-25 DIAGNOSIS — F32A Depression, unspecified: Secondary | ICD-10-CM | POA: Diagnosis present

## 2021-01-25 DIAGNOSIS — Z7983 Long term (current) use of bisphosphonates: Secondary | ICD-10-CM

## 2021-01-25 DIAGNOSIS — Z96652 Presence of left artificial knee joint: Secondary | ICD-10-CM | POA: Diagnosis present

## 2021-01-25 DIAGNOSIS — Z7901 Long term (current) use of anticoagulants: Secondary | ICD-10-CM

## 2021-01-25 DIAGNOSIS — W19XXXA Unspecified fall, initial encounter: Secondary | ICD-10-CM | POA: Diagnosis present

## 2021-01-25 DIAGNOSIS — B9689 Other specified bacterial agents as the cause of diseases classified elsewhere: Secondary | ICD-10-CM | POA: Diagnosis present

## 2021-01-25 DIAGNOSIS — Y92002 Bathroom of unspecified non-institutional (private) residence single-family (private) house as the place of occurrence of the external cause: Secondary | ICD-10-CM

## 2021-01-25 DIAGNOSIS — R000 Tachycardia, unspecified: Secondary | ICD-10-CM | POA: Diagnosis present

## 2021-01-25 DIAGNOSIS — F418 Other specified anxiety disorders: Secondary | ICD-10-CM | POA: Diagnosis present

## 2021-01-25 DIAGNOSIS — K529 Noninfective gastroenteritis and colitis, unspecified: Secondary | ICD-10-CM | POA: Diagnosis present

## 2021-01-25 DIAGNOSIS — Z9884 Bariatric surgery status: Secondary | ICD-10-CM

## 2021-01-25 DIAGNOSIS — G9341 Metabolic encephalopathy: Secondary | ICD-10-CM | POA: Diagnosis present

## 2021-01-25 DIAGNOSIS — Z79899 Other long term (current) drug therapy: Secondary | ICD-10-CM

## 2021-01-25 LAB — COMPREHENSIVE METABOLIC PANEL
ALT: 12 U/L (ref 0–44)
AST: 21 U/L (ref 15–41)
Albumin: 3.3 g/dL — ABNORMAL LOW (ref 3.5–5.0)
Alkaline Phosphatase: 77 U/L (ref 38–126)
Anion gap: 8 (ref 5–15)
BUN: 12 mg/dL (ref 8–23)
CO2: 27 mmol/L (ref 22–32)
Calcium: 8.4 mg/dL — ABNORMAL LOW (ref 8.9–10.3)
Chloride: 106 mmol/L (ref 98–111)
Creatinine, Ser: 0.79 mg/dL (ref 0.44–1.00)
GFR, Estimated: >60 mL/min (ref >60)
Glucose, Bld: 168 mg/dL — ABNORMAL HIGH (ref 70–99)
Potassium: 4.5 mmol/L (ref 3.5–5.1)
Sodium: 141 mmol/L (ref 135–145)
Total Bilirubin: 0.8 mg/dL (ref 0.3–1.2)
Total Protein: 6.5 g/dL (ref 6.5–8.1)

## 2021-01-25 LAB — CBC
HCT: 44.8 % (ref 36.0–46.0)
Hemoglobin: 14.2 g/dL (ref 12.0–15.0)
MCH: 29.6 pg (ref 26.0–34.0)
MCHC: 31.7 g/dL (ref 30.0–36.0)
MCV: 93.5 fL (ref 80.0–100.0)
Platelets: 157 10*3/uL (ref 150–400)
RBC: 4.79 MIL/uL (ref 3.87–5.11)
RDW: 15.3 % (ref 11.5–15.5)
WBC: 17.1 10*3/uL — ABNORMAL HIGH (ref 4.0–10.5)
nRBC: 0 % (ref 0.0–0.2)

## 2021-01-25 LAB — CK: Total CK: 146 U/L (ref 38–234)

## 2021-01-25 NOTE — ED Notes (Signed)
Per [employer] interpreter pt fell in the bathroom. She is a poor historian and had difficulty answering questions. Sons name is [place] phone [phone]

## 2021-01-25 NOTE — ED Provider Notes (Signed)
[facility] Emergency Department Provider Note  ____________________________________________   I have reviewed the triage vital signs and the nursing notes.   HISTORY  Chief Complaint Fall   History limited by and level 5 caveat due to: Confusion  HPI Shelly Freeman is a 70 y.o. female who presents to the emergency department today after an apparent fall. The patient is confused and cannot give any history at this time. The patient reportedly fell earlier today in the bathroom and was on the ground for a number of hours.    Records reviewed. Per medical record review patient has a history of HTN, COPD, pneumonia.   Past Medical History:  Diagnosis Date   Arthritis    Collagen vascular disease ([place])    COPD (chronic obstructive pulmonary disease) (HCC)    DDD (degenerative disc disease), lumbar 05/14/2016   Depression    Dizziness    Dyspnea    GERD (gastroesophageal reflux disease)    Heart valve problem    per grandaughter need surgery but they told her may not survive;   History of kidney stones    Hypertension    Hypothyroidism    Lupus ([place])    Neuromuscular disorder ([place])    Sleep apnea    no CPAP   Thyroid disease    Vitamin D deficiency 05/14/2016    Patient Active Problem List   Diagnosis Date Noted   S/P TKR (total knee replacement) using cement, 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   Bilateral pneumonia 08/29/2020   AKI (acute kidney injury) ([place]) 08/29/2020   COPD (chronic obstructive pulmonary disease) ([place]) 08/29/2020   Fall at home, initial encounter 08/29/2020   CAP (community acquired pneumonia) 08/29/2020   Obesity, Class III, BMI 40-49.9 (morbid obesity) ([place]) 05/23/2019   UTI (urinary tract infection) 05/23/2019   Right rib fracture 05/23/2019   SIRS (systemic inflammatory response syndrome) ([place]) 05/23/2019   Bacteremia due to Gram-negative bacteria 05/23/2019   Acute bronchiolitis 01/28/2019   Left anterior knee pain 10/24/2016    Right upper quadrant pain 10/24/2016   Urination pain 06/26/2016   DDD (degenerative disc disease), lumbar 05/14/2016   Lipoma of skin 05/14/2016   Vitamin D deficiency, unspecified 05/14/2016   Major neurocognitive disorder, due to vascular disease, with behavioral disturbance, mild ([place]) 04/15/2016   Severe recurrent major depression with psychotic features ([place]) 04/04/2016   Lupus ([place]) 04/04/2016   GERD (gastroesophageal reflux disease) 04/04/2016   COPD with acute bronchitis ([place]) 04/04/2016   Acute respiratory failure with hypoxia ([place]) 04/03/2016   Encephalopathy, metabolic 27/10/2374   Hypokalemia 04/03/2016   Pain in limb 04/03/2016   Hypothyroidism 04/03/2016   Essential hypertension 04/03/2016   Intentional opiate overdose ([place]) 03/30/2016   Age related osteoporosis 03/19/2016   Chronic midline low back pain without sciatica 03/19/2016   Encounter for long-term (current) use of high-risk medication 03/19/2016   Primary osteoarthritis of both knees 03/19/2016    Past Surgical History:  Procedure Laterality Date   APPENDECTOMY     BREAST BIOPSY Left    neg   CESAREAN SECTION     COLONOSCOPY WITH PROPOFOL N/A 07/01/2017   Procedure: COLONOSCOPY WITH PROPOFOL;  Surgeon: Manya Silvas, MD;  Location: [facility] ENDOSCOPY;  Service: Endoscopy;  Laterality: N/A;   ESOPHAGOGASTRODUODENOSCOPY (EGD) WITH PROPOFOL N/A 07/01/2017   Procedure: ESOPHAGOGASTRODUODENOSCOPY (EGD) WITH PROPOFOL;  Surgeon: Manya Silvas, MD;  Location[facility] ENDOSCOPY;  Service: Endoscopy;  Laterality: N/A;   GASTRIC BYPASS     LITHOTRIPSY  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   Procedure: TOTAL KNEE ARTHROPLASTY;  Surgeon: Hessie Knows, MD;  Location: [facility] ORS;  Service: Orthopedics;  Laterality: Left;   TUBAL LIGATION      Prior to Admission medications   Medication Sig Start Date End Date Taking? Authorizing Provider  alendronate (FOSAMAX) 70 MG tablet Take 70 mg by mouth once a week. Take  with a full glass of water on an empty stomach.    [provider]  celecoxib (CELEBREX) 200 MG capsule Take 200 mg by mouth daily.    [provider]  docusate sodium (COLACE) 100 MG capsule Take 1 capsule (100 mg total) by mouth 2 (two) times daily. 10/27/20   Duanne Guess, PA-C  enoxaparin (LOVENOX) 40 MG/0.4ML injection Inject 0.4 mLs (40 mg total) into the skin daily for 14 days. 10/27/20 11/10/20  Duanne Guess, PA-C  escitalopram (LEXAPRO) 20 MG tablet Take 20 mg by mouth daily.    [provider]  famotidine (PEPCID) 20 MG tablet Take 20 mg by mouth 2 (two) times daily.    [provider]  ferrous sulfate 325 (65 FE) MG tablet Take 325 mg by mouth daily.    [provider]  folic acid (FOLVITE) 1 MG tablet Take 1 mg by mouth daily.    [provider]  gabapentin (NEURONTIN) 800 MG tablet Take 800 mg by mouth 2 (two) times daily.    [provider]  HYDROcodone-acetaminophen (NORCO) 7.5-325 MG tablet Take 1-2 tablets by mouth every 4 (four) hours as needed for severe pain (pain score 7-10). 10/27/20   Duanne Guess, PA-C  isosorbide mononitrate (IMDUR) 30 MG 24 hr tablet Take 30 mg by mouth daily.    [provider]  levothyroxine (SYNTHROID, LEVOTHROID) 75 MCG tablet Take 75 mcg by mouth daily before breakfast.    [provider]  LORazepam (ATIVAN) 0.5 MG tablet Take 0.5 mg by mouth at bedtime.    [provider]  methocarbamol (ROBAXIN) 500 MG tablet Take 1 tablet (500 mg total) by mouth every 6 (six) hours as needed for muscle spasms. 10/27/20   Duanne Guess, PA-C  metoprolol succinate (TOPROL-XL) 25 MG 24 hr tablet Take 25 mg by mouth daily.     [provider]  OLANZapine (ZYPREXA) 15 MG tablet Take 1 tablet (15 mg total) by mouth at bedtime. 04/16/16   Pucilowska, Jolanta B, MD  ondansetron (ZOFRAN) 4 MG tablet Take 1 tablet (4 mg total) by mouth every 6 (six) hours as needed for  nausea. 10/27/20   Duanne Guess, PA-C  pantoprazole (PROTONIX) 20 MG tablet Take 20 mg by mouth daily.    [provider]  traMADol (ULTRAM) 50 MG tablet Take 1 tablet (50 mg total) by mouth every 6 (six) hours as needed. 12/21/20   Fisher, Linden Dolin, PA-C  traZODone (DESYREL) 100 MG tablet Take 125 mg by mouth at bedtime.    [provider]    Allergies Patient has no known allergies.  Family History  Problem Relation Age of Onset   Breast cancer Maternal Aunt    Breast cancer Cousin     Social History Social History   Tobacco Use   Smoking status: Never   Smokeless tobacco: Never  Vaping Use   Vaping Use: Never used  Substance Use Topics   Alcohol use: No   Drug use: No    Review of Systems Unable to obtain secondary to confusion.  ____________________________________________  PHYSICAL EXAM:  VITAL SIGNS: ED Triage Vitals  Enc Vitals Group     BP 01/25/21 2215 (!) 123/98     Pulse Rate 01/25/21 2215 88     Resp 01/25/21 2215 (!) 22     Temp 01/25/21 2236 98.1 F (36.7 C)     Temp Source 01/25/21 2236 Oral     SpO2 01/25/21 2215 98 %     Weight --     Constitutional: Awake and alert. Confused. Eyes: Conjunctivae are normal.  ENT      Head: Normocephalic and atraumatic.      Nose: No congestion/rhinnorhea.      Mouth/Throat: Mucous membranes are moist.      Neck: No stridor. Hematological/Lymphatic/Immunilogical: No cervical lymphadenopathy. Cardiovascular: Normal rate, regular rhythm.  No murmurs, rubs, or gallops.  Respiratory: Normal respiratory effort without tachypnea nor retractions. Breath sounds are clear and equal bilaterally. No wheezes/rales/rhonchi. Gastrointestinal: Soft and non tender. No rebound. No guarding.  Genitourinary: Deferred Musculoskeletal: Normal range of motion in all extremities. No lower extremity edema. Neurologic:  Positive for confusion. Skin:  Skin is warm, dry and intact. No rash noted. Psychiatric:  Mood and affect are normal. Speech and behavior are normal. Patient exhibits appropriate insight and judgment.  ____________________________________________    LABS (pertinent positives/negatives)  CK 146 CBC wbc 17.1, hgb 14.2, plt 157 CMP wnl except glu 168, ca 8.4, alb 3.3  ____________________________________________   EKG   I, Nance Pear, attending physician, personally viewed and interpreted this EKG  EKG Time: 0312 Rate: 91 Rhythm: normal sinus rhythm Axis: normal Intervals: qtc 413 QRS: narrow ST changes: no st elevation Impression: normal ekg   ____________________________________________    RADIOLOGY  Left knee x-ray Hx of replacement. No acute osseous abnormality. Small effusion.  Pelvis x-ray No acute fracture or dislocation  CT head/cervical spine No acute intracranial abnormality. No acute osseous abnormality. ____________________________________________   PROCEDURES  Procedures  ____________________________________________   INITIAL IMPRESSION / ASSESSMENT AND PLAN / ED COURSE  Pertinent labs & imaging results that were available during my care of the patient were reviewed by me and considered in my medical decision making (see chart for details).   Patient presented to the emergency department today after a fall.  Patient is somewhat confused and cannot give a good history.  It does sound like the patient was on the ground for quite some time.  CK without concerning elevation.  Urine was concerning for urinary tract infection.  Patient did have leukocytosis on blood work.  Because of this will start antibiotics and plan on admission.  ____________________________________________   FINAL CLINICAL IMPRESSION(S) / ED DIAGNOSES  Final diagnoses:  Primary osteoarthritis of left knee     Note: This dictation was prepared with Dragon dictation. Any transcriptional errors that result from this process are unintentional     Nance Pear, MD 01/26/21 [phone]

## 2021-01-25 NOTE — ED Triage Notes (Signed)
Per EMS pt was home and fell in [place]. C/o pain to left hip. Had been laying on right side an also then c/o pain to right side. Pt only speaks spanish and had son on phone as Optometrist. Per son she says she has been on floor since noon. Pt had a medical alert that she didn't push until much later and son states she has some confusion at times.   388/71-95 94%

## 2021-01-26 ENCOUNTER — Encounter: Payer: Self-pay | Admitting: Internal Medicine

## 2021-01-26 ENCOUNTER — Inpatient Hospital Stay: Payer: Medicare (Managed Care)

## 2021-01-26 DIAGNOSIS — N3000 Acute cystitis without hematuria: Secondary | ICD-10-CM

## 2021-01-26 DIAGNOSIS — Z803 Family history of malignant neoplasm of breast: Secondary | ICD-10-CM | POA: Diagnosis not present

## 2021-01-26 DIAGNOSIS — A4150 Gram-negative sepsis, unspecified: Secondary | ICD-10-CM | POA: Insufficient documentation

## 2021-01-26 DIAGNOSIS — A419 Sepsis, unspecified organism: Secondary | ICD-10-CM | POA: Diagnosis present

## 2021-01-26 DIAGNOSIS — N390 Urinary tract infection, site not specified: Secondary | ICD-10-CM | POA: Diagnosis present

## 2021-01-26 DIAGNOSIS — G9341 Metabolic encephalopathy: Secondary | ICD-10-CM | POA: Diagnosis present

## 2021-01-26 DIAGNOSIS — G4733 Obstructive sleep apnea (adult) (pediatric): Secondary | ICD-10-CM | POA: Diagnosis present

## 2021-01-26 DIAGNOSIS — G8929 Other chronic pain: Secondary | ICD-10-CM | POA: Diagnosis present

## 2021-01-26 DIAGNOSIS — E039 Hypothyroidism, unspecified: Secondary | ICD-10-CM | POA: Diagnosis present

## 2021-01-26 DIAGNOSIS — W19XXXA Unspecified fall, initial encounter: Secondary | ICD-10-CM | POA: Diagnosis present

## 2021-01-26 DIAGNOSIS — F32A Depression, unspecified: Secondary | ICD-10-CM | POA: Diagnosis present

## 2021-01-26 DIAGNOSIS — Z9884 Bariatric surgery status: Secondary | ICD-10-CM | POA: Diagnosis not present

## 2021-01-26 DIAGNOSIS — Z22322 Carrier or suspected carrier of Methicillin resistant Staphylococcus aureus: Secondary | ICD-10-CM

## 2021-01-26 DIAGNOSIS — Y92009 Unspecified place in unspecified non-institutional (private) residence as the place of occurrence of the external cause: Secondary | ICD-10-CM

## 2021-01-26 DIAGNOSIS — F0390 Unspecified dementia without behavioral disturbance: Secondary | ICD-10-CM | POA: Diagnosis present

## 2021-01-26 DIAGNOSIS — I10 Essential (primary) hypertension: Secondary | ICD-10-CM | POA: Diagnosis present

## 2021-01-26 DIAGNOSIS — M329 Systemic lupus erythematosus, unspecified: Secondary | ICD-10-CM | POA: Diagnosis present

## 2021-01-26 DIAGNOSIS — R7881 Bacteremia: Secondary | ICD-10-CM

## 2021-01-26 DIAGNOSIS — R6521 Severe sepsis with septic shock: Secondary | ICD-10-CM | POA: Diagnosis present

## 2021-01-26 DIAGNOSIS — F419 Anxiety disorder, unspecified: Secondary | ICD-10-CM | POA: Diagnosis present

## 2021-01-26 DIAGNOSIS — Z96652 Presence of left artificial knee joint: Secondary | ICD-10-CM | POA: Diagnosis present

## 2021-01-26 DIAGNOSIS — Z87442 Personal history of urinary calculi: Secondary | ICD-10-CM | POA: Diagnosis not present

## 2021-01-26 DIAGNOSIS — F418 Other specified anxiety disorders: Secondary | ICD-10-CM | POA: Diagnosis not present

## 2021-01-26 DIAGNOSIS — N12 Tubulo-interstitial nephritis, not specified as acute or chronic: Secondary | ICD-10-CM | POA: Diagnosis present

## 2021-01-26 DIAGNOSIS — Y92002 Bathroom of unspecified non-institutional (private) residence single-family (private) house as the place of occurrence of the external cause: Secondary | ICD-10-CM | POA: Diagnosis not present

## 2021-01-26 DIAGNOSIS — K219 Gastro-esophageal reflux disease without esophagitis: Secondary | ICD-10-CM | POA: Diagnosis present

## 2021-01-26 DIAGNOSIS — U071 COVID-19: Secondary | ICD-10-CM | POA: Insufficient documentation

## 2021-01-26 DIAGNOSIS — K529 Noninfective gastroenteritis and colitis, unspecified: Secondary | ICD-10-CM | POA: Diagnosis present

## 2021-01-26 DIAGNOSIS — Z20822 Contact with and (suspected) exposure to covid-19: Secondary | ICD-10-CM | POA: Diagnosis present

## 2021-01-26 DIAGNOSIS — R296 Repeated falls: Secondary | ICD-10-CM | POA: Diagnosis present

## 2021-01-26 DIAGNOSIS — J449 Chronic obstructive pulmonary disease, unspecified: Secondary | ICD-10-CM

## 2021-01-26 DIAGNOSIS — B9689 Other specified bacterial agents as the cause of diseases classified elsewhere: Secondary | ICD-10-CM | POA: Diagnosis present

## 2021-01-26 DIAGNOSIS — Z6841 Body Mass Index (BMI) 40.0 and over, adult: Secondary | ICD-10-CM | POA: Diagnosis not present

## 2021-01-26 HISTORY — DX: Carrier or suspected carrier of methicillin resistant Staphylococcus aureus: Z22.322

## 2021-01-26 HISTORY — DX: Bacteremia: R78.81

## 2021-01-26 LAB — URINALYSIS, COMPLETE (UACMP) WITH MICROSCOPIC
Bilirubin Urine: NEGATIVE
Glucose, UA: NEGATIVE mg/dL
Ketones, ur: 20 mg/dL — AB
Nitrite: NEGATIVE
Protein, ur: 30 mg/dL — AB
Specific Gravity, Urine: 1.014 (ref 1.005–1.030)
WBC, UA: >50 WBC/hpf — ABNORMAL HIGH (ref 0–5)
pH: 8 (ref 5.0–8.0)

## 2021-01-26 LAB — GLUCOSE, CAPILLARY: Glucose-Capillary: 133 mg/dL — ABNORMAL HIGH (ref 70–99)

## 2021-01-26 LAB — RESP PANEL BY RT-PCR (FLU A&B, COVID) ARPGX2
Influenza A by PCR: NEGATIVE
Influenza B by PCR: NEGATIVE
SARS Coronavirus 2 by RT PCR: NEGATIVE

## 2021-01-26 LAB — PROCALCITONIN: Procalcitonin: 4.37 ng/mL

## 2021-01-26 LAB — MRSA NEXT GEN BY PCR, NASAL: MRSA by PCR Next Gen: DETECTED — AB

## 2021-01-26 LAB — LACTIC ACID, PLASMA
Lactic Acid, Venous: 1.3 mmol/L (ref 0.5–1.9)
Lactic Acid, Venous: 3.6 mmol/L (ref 0.5–1.9)
Lactic Acid, Venous: 1.5 mmol/L (ref 0.5–1.9)
Lactic Acid, Venous: 1.5 mmol/L (ref 0.5–1.9)

## 2021-01-26 LAB — CBG MONITORING, ED: Glucose-Capillary: 129 mg/dL — ABNORMAL HIGH (ref 70–99)

## 2021-01-26 LAB — TROPONIN I (HIGH SENSITIVITY)
Troponin I (High Sensitivity): 27 ng/L — ABNORMAL HIGH (ref <18)
Troponin I (High Sensitivity): 27 ng/L — ABNORMAL HIGH (ref <18)

## 2021-01-26 MED ORDER — ENOXAPARIN SODIUM 40 MG/0.4ML IJ SOSY
40.0000 mg | PREFILLED_SYRINGE | INTRAMUSCULAR | Status: DC
  Administered 2021-01-26 – 2021-01-30 (×5): 40 mg via SUBCUTANEOUS
  Filled 2021-01-26 (×4): qty 0.4

## 2021-01-26 MED ORDER — FAMOTIDINE 20 MG PO TABS
20.0000 mg | ORAL_TABLET | Freq: Two times a day (BID) | ORAL | Status: DC
  Administered 2021-01-26 – 2021-01-31 (×11): 20 mg via ORAL
  Filled 2021-01-26 (×11): qty 1

## 2021-01-26 MED ORDER — METRONIDAZOLE 500 MG/100ML IV SOLN
500.0000 mg | Freq: Two times a day (BID) | INTRAVENOUS | Status: DC
  Administered 2021-01-26 – 2021-01-28 (×4): 500 mg via INTRAVENOUS
  Filled 2021-01-26 (×4): qty 100

## 2021-01-26 MED ORDER — ALBUTEROL SULFATE (2.5 MG/3ML) 0.083% IN NEBU: 2.5000 mg | INHALATION_SOLUTION | RESPIRATORY_TRACT | Status: DC | PRN

## 2021-01-26 MED ORDER — MORPHINE SULFATE (PF) 2 MG/ML IV SOLN
2.0000 mg | Freq: Once | INTRAVENOUS | Status: AC
  Administered 2021-01-26: 2 mg via INTRAVENOUS
  Filled 2021-01-26: qty 1

## 2021-01-26 MED ORDER — TRAZODONE HCL 50 MG PO TABS
125.0000 mg | ORAL_TABLET | Freq: Every day | ORAL | Status: DC
  Administered 2021-01-26 – 2021-01-30 (×5): 125 mg via ORAL
  Filled 2021-01-26 (×5): qty 1

## 2021-01-26 MED ORDER — FOLIC ACID 1 MG PO TABS
1.0000 mg | ORAL_TABLET | Freq: Every day | ORAL | Status: DC
  Administered 2021-01-26 – 2021-01-31 (×6): 1 mg via ORAL
  Filled 2021-01-26 (×6): qty 1

## 2021-01-26 MED ORDER — MIDODRINE HCL 5 MG PO TABS: 10.0000 mg | ORAL_TABLET | Freq: Three times a day (TID) | ORAL | Status: DC

## 2021-01-26 MED ORDER — SODIUM CHLORIDE 0.9 % IV SOLN
1.0000 g | INTRAVENOUS | Status: DC
  Administered 2021-01-27 – 2021-01-28 (×2): 1 g via INTRAVENOUS
  Filled 2021-01-26 (×2): qty 1

## 2021-01-26 MED ORDER — NOREPINEPHRINE 4 MG/250ML-% IV SOLN
2.0000 ug/min | INTRAVENOUS | Status: DC
  Administered 2021-01-26: 2 ug/min via INTRAVENOUS
  Filled 2021-01-26: qty 250

## 2021-01-26 MED ORDER — OLANZAPINE 7.5 MG PO TABS
7.5000 mg | ORAL_TABLET | Freq: Every day | ORAL | Status: DC
  Administered 2021-01-26 – 2021-01-30 (×5): 7.5 mg via ORAL
  Filled 2021-01-26 (×8): qty 1

## 2021-01-26 MED ORDER — SODIUM CHLORIDE 0.9 % IV SOLN
250.0000 mL | INTRAVENOUS | Status: DC
  Administered 2021-01-26 – 2021-01-27 (×3): 250 mL via INTRAVENOUS

## 2021-01-26 MED ORDER — LACTATED RINGERS IV BOLUS: 1000.0000 mL | Freq: Once | INTRAVENOUS | Status: DC

## 2021-01-26 MED ORDER — ONDANSETRON HCL 4 MG/2ML IJ SOLN
4.0000 mg | Freq: Three times a day (TID) | INTRAMUSCULAR | Status: DC | PRN
  Administered 2021-01-27: 4 mg via INTRAVENOUS
  Filled 2021-01-26: qty 2

## 2021-01-26 MED ORDER — SODIUM CHLORIDE 0.9 % IV SOLN
1.0000 g | Freq: Once | INTRAVENOUS | Status: AC
  Administered 2021-01-26: 1 g via INTRAVENOUS
  Filled 2021-01-26: qty 10

## 2021-01-26 MED ORDER — CHLORHEXIDINE GLUCONATE CLOTH 2 % EX PADS
6.0000 | MEDICATED_PAD | Freq: Every day | CUTANEOUS | Status: AC
  Administered 2021-01-27 – 2021-01-31 (×5): 6 via TOPICAL

## 2021-01-26 MED ORDER — DM-GUAIFENESIN ER 30-600 MG PO TB12: 1.0000 | ORAL_TABLET | Freq: Two times a day (BID) | ORAL | Status: DC | PRN

## 2021-01-26 MED ORDER — CHLORHEXIDINE GLUCONATE CLOTH 2 % EX PADS
6.0000 | MEDICATED_PAD | Freq: Every day | CUTANEOUS | Status: DC
  Administered 2021-01-26: 6 via TOPICAL

## 2021-01-26 MED ORDER — ACETAMINOPHEN 325 MG PO TABS
650.0000 mg | ORAL_TABLET | Freq: Four times a day (QID) | ORAL | Status: DC | PRN
  Administered 2021-01-27 – 2021-01-30 (×3): 650 mg via ORAL
  Filled 2021-01-26 (×3): qty 2

## 2021-01-26 MED ORDER — SODIUM CHLORIDE 0.9 % IV BOLUS: 1000.0000 mL | Freq: Once | INTRAVENOUS | Status: DC

## 2021-01-26 MED ORDER — MUPIROCIN 2 % EX OINT
1.0000 "application " | TOPICAL_OINTMENT | Freq: Two times a day (BID) | CUTANEOUS | Status: DC
  Administered 2021-01-27 – 2021-01-31 (×7): 1 via NASAL
  Filled 2021-01-26: qty 22

## 2021-01-26 MED ORDER — HYDRALAZINE HCL 20 MG/ML IJ SOLN: 5.0000 mg | INTRAMUSCULAR | Status: DC | PRN

## 2021-01-26 MED ORDER — IPRATROPIUM-ALBUTEROL 0.5-2.5 (3) MG/3ML IN SOLN: 3.0000 mL | Freq: Four times a day (QID) | RESPIRATORY_TRACT | Status: DC | PRN

## 2021-01-26 MED ORDER — HYDROCORTISONE SOD SUC (PF) 100 MG IJ SOLR
100.0000 mg | Freq: Once | INTRAMUSCULAR | Status: DC
  Filled 2021-01-26: qty 2

## 2021-01-26 MED ORDER — MIDODRINE HCL 5 MG PO TABS
10.0000 mg | ORAL_TABLET | Freq: Three times a day (TID) | ORAL | Status: DC
  Administered 2021-01-26 – 2021-01-28 (×5): 10 mg via ORAL
  Filled 2021-01-26 (×5): qty 2

## 2021-01-26 MED ORDER — FERROUS SULFATE 325 (65 FE) MG PO TABS
325.0000 mg | ORAL_TABLET | ORAL | Status: DC
  Administered 2021-01-27 – 2021-01-30 (×2): 325 mg via ORAL
  Filled 2021-01-26 (×3): qty 1

## 2021-01-26 MED ORDER — HYDROCORTISONE SOD SUC (PF) 100 MG IJ SOLR
100.0000 mg | Freq: Three times a day (TID) | INTRAMUSCULAR | Status: DC
  Administered 2021-01-26 – 2021-01-28 (×5): 100 mg via INTRAVENOUS
  Filled 2021-01-26 (×7): qty 2

## 2021-01-26 MED ORDER — OXYCODONE-ACETAMINOPHEN 5-325 MG PO TABS
1.0000 | ORAL_TABLET | ORAL | Status: DC | PRN
  Administered 2021-01-26 – 2021-01-30 (×14): 1 via ORAL
  Filled 2021-01-26 (×14): qty 1

## 2021-01-26 MED ORDER — ESCITALOPRAM OXALATE 20 MG PO TABS
20.0000 mg | ORAL_TABLET | Freq: Every day | ORAL | Status: DC
  Administered 2021-01-26 – 2021-01-31 (×6): 20 mg via ORAL
  Filled 2021-01-26: qty 2
  Filled 2021-01-26 (×5): qty 1

## 2021-01-26 MED ORDER — SODIUM CHLORIDE 0.9 % IV BOLUS
1000.0000 mL | Freq: Once | INTRAVENOUS | Status: AC
  Administered 2021-01-26: 1000 mL via INTRAVENOUS

## 2021-01-26 MED ORDER — SODIUM CHLORIDE 0.9 % IV SOLN: INTRAVENOUS | Status: DC

## 2021-01-26 MED ORDER — METOPROLOL SUCCINATE ER 50 MG PO TB24
25.0000 mg | ORAL_TABLET | Freq: Every day | ORAL | Status: DC
  Administered 2021-01-26: 25 mg via ORAL
  Filled 2021-01-26: qty 1

## 2021-01-26 MED ORDER — LACTATED RINGERS IV SOLN: INTRAVENOUS | Status: DC

## 2021-01-26 MED ORDER — LOPERAMIDE HCL 2 MG PO CAPS
2.0000 mg | ORAL_CAPSULE | Freq: Every day | ORAL | Status: DC
  Administered 2021-01-27 – 2021-01-30 (×4): 2 mg via ORAL
  Filled 2021-01-26 (×4): qty 1

## 2021-01-26 MED ORDER — LEVOTHYROXINE SODIUM 50 MCG PO TABS
75.0000 ug | ORAL_TABLET | Freq: Every day | ORAL | Status: DC
  Administered 2021-01-27 – 2021-01-31 (×5): 75 ug via ORAL
  Filled 2021-01-26 (×2): qty 2
  Filled 2021-01-26 (×3): qty 1

## 2021-01-26 MED ORDER — SODIUM CHLORIDE 0.9 % IV BOLUS
2000.0000 mL | Freq: Once | INTRAVENOUS | Status: AC
  Administered 2021-01-26 (×2): 1000 mL via INTRAVENOUS

## 2021-01-26 MED ORDER — ALBUTEROL SULFATE HFA 108 (90 BASE) MCG/ACT IN AERS
2.0000 | INHALATION_SPRAY | RESPIRATORY_TRACT | Status: DC | PRN
Start: 2021-01-26 — End: 2021-01-26

## 2021-01-26 MED ORDER — GABAPENTIN 400 MG PO CAPS
800.0000 mg | ORAL_CAPSULE | Freq: Two times a day (BID) | ORAL | Status: DC
Start: 2021-01-26 — End: 2021-01-31
  Administered 2021-01-26 – 2021-01-31 (×10): 800 mg via ORAL
  Filled 2021-01-26 (×11): qty 2

## 2021-01-26 MED ORDER — LACTATED RINGERS IV BOLUS
500.0000 mL | Freq: Once | INTRAVENOUS | Status: AC
  Administered 2021-01-26: 500 mL via INTRAVENOUS

## 2021-01-26 MED ORDER — ACETAMINOPHEN 500 MG PO TABS
1000.0000 mg | ORAL_TABLET | Freq: Once | ORAL | Status: AC
  Administered 2021-01-26: 1000 mg via ORAL
  Filled 2021-01-26: qty 2

## 2021-01-26 MED ORDER — NOREPINEPHRINE 4 MG/250ML-% IV SOLN: 0.0000 ug/min | INTRAVENOUS | Status: DC

## 2021-01-26 NOTE — Progress Notes (Signed)
[facility]  Chart reviewed Admitted for septic shock due to UTI Has history of collagen vascular disease, recent prednisone (on active med list), therefore high risk for adrenal insufficiency.  Will order random cortisone and hydrocortisone IV  Roselie Awkward, MD [place] [facility] Pager: [phone] Cell: [phone] After 7:00 pm call Elink  [phone]

## 2021-01-26 NOTE — ED Notes (Signed)
Patient is resting comfortably. 

## 2021-01-26 NOTE — ED Notes (Signed)
1st attempt to give report to ICU; spoke with Charge RN Jinny Blossom; told to call back @2015 

## 2021-01-26 NOTE — ED Notes (Signed)
Patient transported to CT 

## 2021-01-26 NOTE — Consult Note (Signed)
NAME:  Shelly Freeman, MRN:  [id number], DOB:  13-Oct-1950, LOS: 0 ADMISSION DATE:  01/25/2021, CONSULTATION DATE:  01/26/2021 REFERRING MD:  Mora Bellman MD, CHIEF COMPLAINT: Septic shock  History of Present Illness:  70 year old with history of hypertension, COPD, hypothyroidism, GERD, OSA not on CPAP, lupus presenting with fall, altered mental status.  She apparently had a fall at home in the bathroom and was on the ground for several hours.  She is hypotensive on admission with positive UA being treated for sepsis.  Blood pressures Koz[place] spite of 3 L of fluid and she has been started on peripheral levo and PCCM called for admission CT head and spine is negative, x-rays of knee and pelvis do not show any acute abnormality  She has been having diarrhea as an outpatient for several weeks.  C. difficile test 2 weeks ago was negative, stool cultures pending.  She has finished a course of Flagyl empirically with slight improvement in diarrhea.  Had a total left knee arthroplasty on 10/25/2020  Pertinent  Medical History    has a past medical history of Arthritis, Collagen vascular disease ([place]), COPD (chronic obstructive pulmonary disease) ([place]), DDD (degenerative disc disease), lumbar (05/14/2016), Depression, Dizziness, Dyspnea, GERD (gastroesophageal reflux disease), Heart valve problem, History of kidney stones, Hypertension, Hypothyroidism, Lupus ([place]), Neuromuscular disorder ([place]), Sleep apnea, Thyroid disease, and Vitamin D deficiency (05/14/2016).   Significant Hospital Events: Including procedures, antibiotic start and stop dates in addition to other pertinent events   9/22-admit  Interim History / Subjective:   Communicated via interpreter.  Chief complaint is diffuse body pain greater in the legs.  She does have burning sensation on urination  Objective   Blood pressure (!) 75/51, pulse 92, temperature (!) 100.4 F (38 C), temperature source Oral, resp. rate 20, SpO2 91 %.         Intake/Output Summary (Last 24 hours) at 01/26/2021 1836 Last data filed at 01/26/2021 1745 Gross per 24 hour  Intake 2070 ml  Output --  Net 2070 ml   There were no vitals filed for this visit.  Examination: Gen:      No acute distress, obese HEENT:  EOMI, sclera anicteric Neck:     No masses; no thyromegaly Lungs:    Clear to auscultation bilaterally; normal respiratory effort CV:         Regular rate and rhythm; no murmurs Abd:      Distended, positive bowel sounds.  No guarding or rigidity Ext:    No edema; adequate peripheral perfusion Skin:      Warm and dry; no rash Neuro: Mild confusion, moving all extremities with no focal deficits.  Has mild diffuse tenderness  Resolved Hospital Problem list     Assessment & Plan:  Severe sepsis present on admission Septic shock secondary to UTI Lactic acid has normalized, continues to be hypotensive and is on peripheral levo Will give additional bolus of LR 500 cc and start drip at 150 an hour Starting midodrine Hopeful to avoid central line placement Continue Rocephin.  Add Flagyl for now while we wait for abdominal imaging  Urosepsis, diarrhea as an outpatient Recheck C. difficile and GI panel CT abdomen pelvis to look for colitis and rule out pyelonephritis  Hypothyroidism Continue Synthroid Check TSH  COPD Bronchodilators  Recurrent falls Left knee replacement in June 2022 CK is normal She had a ED visit in August for fall.  Will need PT to see her when stable  Best Practice (right click and "  Reselect all SmartList Selections" daily)   Diet/type: NPO DVT prophylaxis: LMWH GI prophylaxis: PPI Lines: N/A Foley:  N/A Code Status:  limited.  Do not intubate, okay with CPR and defib.  CODE STATUS confirmed with patient Last date of multidisciplinary goals of care discussion []   Labs   CBC: Recent Labs  Lab 01/25/21 2234  WBC 17.1*  HGB 14.2  HCT 44.8  MCV 93.5  PLT 818    Basic Metabolic Panel: Recent  Labs  Lab 01/25/21 2234  NA 141  K 4.5  CL 106  CO2 27  GLUCOSE 168*  BUN 12  CREATININE 0.79  CALCIUM 8.4*   GFR: CrCl cannot be calculated (Unknown ideal weight.). Recent Labs  Lab 01/25/21 2234 01/26/21 0915 01/26/21 0919 01/26/21 1458 01/26/21 1704  PROCALCITON  --  4.37  --   --   --   WBC 17.1*  --   --   --   --   LATICACIDVEN  --   --  1.3 3.6* 1.5    Liver Function Tests: Recent Labs  Lab 01/25/21 2234  AST 21  ALT 12  ALKPHOS 77  BILITOT 0.8  PROT 6.5  ALBUMIN 3.3*   No results for input(s): LIPASE, AMYLASE in the last 168 hours. No results for input(s): AMMONIA in the last 168 hours.  ABG    Component Value Date/Time   PHART 7.44 03/31/2016 0500   PCO2ART 33 03/31/2016 0500   PO2ART 110 (H) 03/31/2016 0500   HCO3 25.7 08/29/2020 0516   ACIDBASEDEF 1.9 08/29/2020 0516   O2SAT 66.5 08/29/2020 0516     Coagulation Profile: No results for input(s): INR, PROTIME in the last 168 hours.  Cardiac Enzymes: Recent Labs  Lab 01/25/21 2234  CKTOTAL 146    HbA1C: Hgb A1c MFr Bld  Date/Time Value Ref Range Status  04/04/2016 07:00 AM 5.6 4.8 - 5.6 % Final    Comment:    (NOTE)         Pre-diabetes: 5.7 - 6.4         Diabetes: >6.4         Glycemic control for adults with diabetes: <7.0     CBG: Recent Labs  Lab 01/26/21 1055  GLUCAP 129*    Review of Systems:   REVIEW OF SYSTEMS:   All negative; except for those that are bolded, which indicate positives.  Constitutional: weight loss, weight gain, night sweats, fevers, chills, fatigue, weakness.  HEENT: headaches, sore throat, sneezing, nasal congestion, post nasal drip, difficulty swallowing, tooth/dental problems, visual complaints, visual changes, ear aches. Neuro: difficulty with speech, weakness, numbness, ataxia. CV:  chest pain, orthopnea, PND, swelling in lower extremities, dizziness, palpitations, syncope.  Resp: cough, hemoptysis, dyspnea, wheezing. GI: heartburn,  indigestion, abdominal pain, nausea, vomiting, diarrhea, constipation, change in bowel habits, loss of appetite, hematemesis, melena, hematochezia.  GU: dysuria, change in color of urine, urgency or frequency, flank pain, hematuria. MSK: joint pain or swelling, decreased range of motion. Psych: change in mood or affect, depression, anxiety, suicidal ideations, homicidal ideations. Skin: rash, itching, bruising.   Past Medical History:  She,  has a past medical history of Arthritis, Collagen vascular disease ([place]), COPD (chronic obstructive pulmonary disease) ([place]), DDD (degenerative disc disease), lumbar (05/14/2016), Depression, Dizziness, Dyspnea, GERD (gastroesophageal reflux disease), Heart valve problem, History of kidney stones, Hypertension, Hypothyroidism, Lupus ([place]), Neuromuscular disorder ([place]), Sleep apnea, Thyroid disease, and Vitamin D deficiency (05/14/2016).   Surgical History:   Past Surgical History:  Procedure  Laterality Date   APPENDECTOMY     BREAST BIOPSY Left    neg   CESAREAN SECTION     COLONOSCOPY WITH PROPOFOL N/A 07/01/2017   Procedure: COLONOSCOPY WITH PROPOFOL;  Surgeon: Manya Silvas, MD;  Location: [facility] ENDOSCOPY;  Service: Endoscopy;  Laterality: N/A;   ESOPHAGOGASTRODUODENOSCOPY (EGD) WITH PROPOFOL N/A 07/01/2017   Procedure: ESOPHAGOGASTRODUODENOSCOPY (EGD) WITH PROPOFOL;  Surgeon: Manya Silvas, MD;  Location[facility] ENDOSCOPY;  Service: Endoscopy;  Laterality: N/A;   GASTRIC BYPASS     LITHOTRIPSY     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   Procedure: TOTAL KNEE ARTHROPLASTY;  Surgeon: Hessie Knows, MD;  Location: [facility] ORS;  Service: Orthopedics;  Laterality: Left;   TUBAL LIGATION       Social History:   reports that she has never smoked. She has never used smokeless tobacco. She reports that she does not drink alcohol and does not use drugs.   Family History:  Her family history includes Breast cancer in her cousin and maternal aunt.    Allergies No Known Allergies   Home Medications  Prior to Admission medications   Medication Sig Start Date End Date Taking? Authorizing Provider  acetaminophen (TYLENOL) 325 MG tablet Take 650 mg by mouth 3 (three) times daily as needed for mild pain. Do not take more than 4 tablets in a day if you take Hydrocodone-Apap too.   Yes [provider]  albuterol (VENTOLIN HFA) 108 (90 Base) MCG/ACT inhaler Inhale 2 puffs into the lungs every 4 (four) hours as needed for wheezing or shortness of breath.   Yes [provider]  escitalopram (LEXAPRO) 20 MG tablet Take 20 mg by mouth daily.   Yes [provider]  famotidine (PEPCID) 20 MG tablet Take 20 mg by mouth 2 (two) times daily.   Yes [provider]  ferrous sulfate 325 (65 FE) MG tablet Take 325 mg by mouth every Monday, Wednesday, and Friday.   Yes [provider]  folic acid (FOLVITE) 1 MG tablet Take 1 mg by mouth daily.   Yes [provider]  gabapentin (NEURONTIN) 800 MG tablet Take 800 mg by mouth 2 (two) times daily.   Yes [provider]  HYDROcodone-acetaminophen (NORCO/VICODIN) 5-325 MG tablet Take 0.5-1 tablets by mouth every 6 (six) hours as needed for pain. 01/24/21  Yes [provider]  hydrocortisone cream 1 % Apply 1 application topically 3 (three) times daily as needed for itching.   Yes [provider]  ipratropium-albuterol (DUONEB) 0.5-2.5 (3) MG/3ML SOLN Take 3 mLs by nebulization every 6 (six) hours as needed (shortness of breath).   Yes [provider]  levothyroxine (SYNTHROID, LEVOTHROID) 75 MCG tablet Take 75 mcg by mouth daily before breakfast.   Yes [provider]  lidocaine (LIDODERM) 5 % Place 1 patch onto the skin daily. Remove & Discard patch within 12 hours or as directed by MD   Yes [provider]  metoprolol succinate (TOPROL-XL) 25 MG 24 hr tablet Take 25 mg by mouth daily.    Yes [provider]  predniSONE (DELTASONE) 20 MG tablet Take 20 mg by mouth as needed (asthma attack).   Yes [provider]  Vitamin D, Ergocalciferol, (DRISDOL) 1.25 MG (50000 UNIT) CAPS capsule Take 50,000 Units by mouth every 7 (seven) days.   Yes [provider]  alendronate (FOSAMAX) 70 MG tablet Take 70 mg by mouth once a week. Take with a full glass of water on an  empty stomach.    [provider]  celecoxib (CELEBREX) 200 MG capsule Take 200 mg by mouth daily. Patient not taking: No sig reported    [provider]  docusate sodium (COLACE) 100 MG capsule Take 1 capsule (100 mg total) by mouth 2 (two) times daily. 10/27/20   Duanne Guess, PA-C  enoxaparin (LOVENOX) 40 MG/0.4ML injection Inject 0.4 mLs (40 mg total) into the skin daily for 14 days. 10/27/20 11/10/20  Duanne Guess, PA-C  HYDROcodone-acetaminophen (NORCO) 7.5-325 MG tablet Take 1-2 tablets by mouth every 4 (four) hours as needed for severe pain (pain score 7-10). Patient not taking: No sig reported 10/27/20   Duanne Guess, PA-C  isosorbide mononitrate (IMDUR) 30 MG 24 hr tablet Take 30 mg by mouth daily. Patient not taking: No sig reported    [provider]  loperamide (IMODIUM) 2 MG capsule Take 2 mg by mouth daily at 12 noon. For Diarrhea 01/20/21   [provider]  LORazepam (ATIVAN) 0.5 MG tablet Take 0.5 mg by mouth at bedtime. Patient not taking: No sig reported    [provider]  methocarbamol (ROBAXIN) 500 MG tablet Take 1 tablet (500 mg total) by mouth every 6 (six) hours as needed for muscle spasms. Patient not taking: No sig reported 10/27/20   Duanne Guess, PA-C  OLANZapine (ZYPREXA) 15 MG tablet Take 1 tablet (15 mg total) by mouth at bedtime. Patient not taking: No sig reported 04/16/16   Pucilowska, Jolanta B, MD  OLANZapine (ZYPREXA) 7.5 MG tablet Take 7.5 mg by mouth at bedtime. 01/05/21   [provider]  ondansetron (ZOFRAN) 4 MG  tablet Take 1 tablet (4 mg total) by mouth every 6 (six) hours as needed for nausea. Patient not taking: No sig reported 10/27/20   Duanne Guess, PA-C  pantoprazole (PROTONIX) 20 MG tablet Take 20 mg by mouth daily. Patient not taking: No sig reported    [provider]  traMADol (ULTRAM) 50 MG tablet Take 1 tablet (50 mg total) by mouth every 6 (six) hours as needed. Patient not taking: No sig reported 12/21/20   Versie Starks, PA-C  traZODone (DESYREL) 100 MG tablet Take 125 mg by mouth at bedtime.    [provider]  traZODone (DESYREL) 50 MG tablet Take 25 mg by mouth at bedtime as needed for sleep. 01/05/21   [provider]     Critical care time:    The patient is critically ill with multiple organ system failure and requires high complexity decision making for assessment and support, frequent evaluation and titration of therapies, advanced monitoring, review of radiographic studies and interpretation of complex data.   Critical Care Time devoted to patient care services, exclusive of separately billable procedures, described in this note is 45 minutes.   Marshell Garfinkel MD [facility] See Amion for pager  If no response to pager , please call [phone] until 7pm After 7:00 pm call Elink  [phone] 01/26/2021, 6:45 PM

## 2021-01-26 NOTE — ED Notes (Signed)
When placed second peripheral IV catheter, attempted to get second set of blood cultures. Unable due to patient bleeding very poorly and not enough in culture vials to send.

## 2021-01-26 NOTE — ED Notes (Signed)
Dr Niu at bedside 

## 2021-01-26 NOTE — H&P (Signed)
History and Physical    Shelly Freeman KNL:[id number] DOB: [birth year] DOA: 01/25/2021  Referring MD/NP/PA:   PCP: Janna Arch, MD   Patient coming from:  The patient is coming from home.      Chief Complaint: fall and altered mental status.  HPI: Shelly Freeman is a 70 y.o. female with medical history significant of hypertension, COPD, hypothyroidism, GERD, lupus, kidney stone, chronic back pain, OSA not on CPAP, positive blood culture with E. 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  Patient speaks Spanish.  History is obtained with help of iPad interpreter.  I also spoke with her granddaughter and her PCP on the phone.  Per report, patient fell in the early morning when she was going to the bathroom. She was laying on the right side for long time.  After fall, she complains of pain in the left hip. Actually, pt complains of pain all over, including both legs, both shoulders, both hands and left hip when I saw pt in ED. Patient is drowsy, mildly confused, but still oriented x3.  Patient moves all extremities.  No facial droop or slurred speech.  Patient states that she has mild shortness of breath, mild dry cough, no chest pain.  No fever or chills.  Patient reports dysuria, burning on urination, and increased urinary frequency.   Patient reports diarrhea.  No nausea, vomiting or abdominal pain. Of note, Dr. Coralie Common from [facility] called me and provided  some important information, which is highly appreciated.  Per Dr. Ovid Curd, patient has been having diarrhea for several weeks.  They are currently doing work-up as outpatient for diarrhea.  Patient had negative C. difficile test 2 weeks ago.  Stool culture is pending.  Patient finished a course of Flagyl treatment empirically.  Her diarrhea has slightly improved.  ED Course: pt was found to have WBC 17.1, positive urinalysis (cloudy appearance, large  amount of leukocyte, many bacteria, WBC> 50), pending COVID PCR, CK1 46, electrolytes renal function okay, temperature 99, blood pressure 113/56, heart rate 96, RR 20, oxygen saturation 92-100% on room air.  Chest x-ray negative for infiltration.  CT of head is negative for acute intracranial abnormalities.  CT of C-spine is negative for bony fracture.  X-ray of left knee and pelvis negative for acute injury.  Patient is admitted to [facility] bed as inpatient.  Review of Systems:   General: no fevers, chills, no body weight gain, has fatigue HEENT: no blurry vision, hearing changes or sore throat Respiratory: has mild dyspnea, coughing, no wheezing CV: no chest pain, no palpitations GI: no nausea, vomiting, abdominal pain, has diarrhea, no constipation GU: no dysuria, burning on urination, increased urinary frequency, hematuria  Ext: no leg edema Neuro: no unilateral weakness, numbness, or tingling, no vision change or hearing loss. Has fall Skin: no rash, no skin tear. MSK: has pain all over Heme: No easy bruising.  Travel history: No recent long distant travel.  Allergy: No Known Allergies  Past Medical History:  Diagnosis Date   Arthritis    Collagen vascular disease (HCC)    COPD (chronic obstructive pulmonary disease) (HCC)    DDD (degenerative disc disease), lumbar 05/14/2016   Depression    Dizziness    Dyspnea    GERD (gastroesophageal reflux disease)    Heart valve problem    per grandaughter need surgery but they told her may not survive;   History of kidney  stones    Hypertension    Hypothyroidism    Lupus ([place])    Neuromuscular disorder ([place])    Sleep apnea    no CPAP   Thyroid disease    Vitamin D deficiency 05/14/2016    Past Surgical History:  Procedure Laterality Date   APPENDECTOMY     BREAST BIOPSY Left    neg   CESAREAN SECTION     COLONOSCOPY WITH PROPOFOL N/A 07/01/2017   Procedure: COLONOSCOPY WITH PROPOFOL;  Surgeon: Manya Silvas, MD;   Location: [facility] ENDOSCOPY;  Service: Endoscopy;  Laterality: N/A;   ESOPHAGOGASTRODUODENOSCOPY (EGD) WITH PROPOFOL N/A 07/01/2017   Procedure: ESOPHAGOGASTRODUODENOSCOPY (EGD) WITH PROPOFOL;  Surgeon: Manya Silvas, MD;  Location: [facility] ENDOSCOPY;  Service: Endoscopy;  Laterality: N/A;   GASTRIC BYPASS     LITHOTRIPSY     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   Procedure: TOTAL KNEE ARTHROPLASTY;  Surgeon: Hessie Knows, MD;  Location: [facility] ORS;  Service: Orthopedics;  Laterality: Left;   TUBAL LIGATION      Social History:  reports that she has never smoked. She has never used smokeless tobacco. She reports that she does not drink alcohol and does not use drugs.  Family History:  Family History  Problem Relation Age of Onset   Breast cancer Maternal Aunt    Breast cancer Cousin      Prior to Admission medications   Medication Sig Start Date End Date Taking? Authorizing Provider  alendronate (FOSAMAX) 70 MG tablet Take 70 mg by mouth once a week. Take with a full glass of water on an empty stomach.    [provider]  celecoxib (CELEBREX) 200 MG capsule Take 200 mg by mouth daily.    [provider]  docusate sodium (COLACE) 100 MG capsule Take 1 capsule (100 mg total) by mouth 2 (two) times daily. 10/27/20   Duanne Guess, PA-C  enoxaparin (LOVENOX) 40 MG/0.4ML injection Inject 0.4 mLs (40 mg total) into the skin daily for 14 days. 10/27/20 11/10/20  Duanne Guess, PA-C  escitalopram (LEXAPRO) 20 MG tablet Take 20 mg by mouth daily.    [provider]  famotidine (PEPCID) 20 MG tablet Take 20 mg by mouth 2 (two) times daily.    [provider]  ferrous sulfate 325 (65 FE) MG tablet Take 325 mg by mouth daily.    [provider]  folic acid (FOLVITE) 1 MG tablet Take 1 mg by mouth daily.    [provider]  gabapentin (NEURONTIN) 800 MG tablet Take 800 mg by mouth 2 (two) times daily.    [provider]   HYDROcodone-acetaminophen (NORCO) 7.5-325 MG tablet Take 1-2 tablets by mouth every 4 (four) hours as needed for severe pain (pain score 7-10). 10/27/20   Duanne Guess, PA-C  isosorbide mononitrate (IMDUR) 30 MG 24 hr tablet Take 30 mg by mouth daily.    [provider]  levothyroxine (SYNTHROID, LEVOTHROID) 75 MCG tablet Take 75 mcg by mouth daily before breakfast.    [provider]  LORazepam (ATIVAN) 0.5 MG tablet Take 0.5 mg by mouth at bedtime.    [provider]  methocarbamol (ROBAXIN) 500 MG tablet Take 1 tablet (500 mg total) by mouth every 6 (six) hours as needed for muscle spasms. 10/27/20   Duanne Guess, PA-C  metoprolol succinate (TOPROL-XL) 25 MG 24 hr tablet Take 25 mg by mouth daily.     [provider]  OLANZapine (ZYPREXA) 15  MG tablet Take 1 tablet (15 mg total) by mouth at bedtime. 04/16/16   Pucilowska, Jolanta B, MD  ondansetron (ZOFRAN) 4 MG tablet Take 1 tablet (4 mg total) by mouth every 6 (six) hours as needed for nausea. 10/27/20   Duanne Guess, PA-C  pantoprazole (PROTONIX) 20 MG tablet Take 20 mg by mouth daily.    [provider]  traMADol (ULTRAM) 50 MG tablet Take 1 tablet (50 mg total) by mouth every 6 (six) hours as needed. 12/21/20   Fisher, Linden Dolin, PA-C  traZODone (DESYREL) 100 MG tablet Take 125 mg by mouth at bedtime.    [provider]    Physical Exam: Vitals:   01/26/21 1710 01/26/21 1740 01/26/21 1800 01/26/21 1803  BP: (!) 85/62 (!) 114/94 (!) 72/59 (!) 75/51  Pulse: (!) 104 100 92 92  Resp: (!) 22 (!) 25 (!) 21 20  Temp:      TempSrc:      SpO2: 91% 92% 91% 91%   General: Not in acute distress HEENT:       Eyes: PERRL, EOMI, no scleral icterus.       ENT: No discharge from the ears and nose, no pharynx injection, no tonsillar enlargement.        Neck: No JVD, no bruit, no mass felt. Heme: No neck lymph node enlargement. Cardiac: S1/S2, RRR, No murmurs, No gallops or  rubs. Respiratory: No rales, wheezing, rhonchi or rubs. GI: Soft, nondistended, nontender, no organomegaly, BS present. GU: No hematuria Ext: No pitting leg edema bilaterally. 1+DP/PT pulse bilaterally. Musculoskeletal: Patient has tenderness all over. Skin: No rashes.  Neuro: Patient is mildly confused, drowsy, but arousable, when aroused, patient is oriented X3, cranial nerves II-XII grossly intact, moves all extremities  Psych: Patient is not psychotic, no suicidal or hemocidal ideation.  Labs on Admission: I have personally reviewed following labs and imaging studies  CBC: Recent Labs  Lab 01/25/21 2234  WBC 17.1*  HGB 14.2  HCT 44.8  MCV 93.5  PLT 850   Basic Metabolic Panel: Recent Labs  Lab 01/25/21 2234  NA 141  K 4.5  CL 106  CO2 27  GLUCOSE 168*  BUN 12  CREATININE 0.79  CALCIUM 8.4*   GFR: CrCl cannot be calculated (Unknown ideal weight.). Liver Function Tests: Recent Labs  Lab 01/25/21 2234  AST 21  ALT 12  ALKPHOS 77  BILITOT 0.8  PROT 6.5  ALBUMIN 3.3*   No results for input(s): LIPASE, AMYLASE in the last 168 hours. No results for input(s): AMMONIA in the last 168 hours. Coagulation Profile: No results for input(s): INR, PROTIME in the last 168 hours. Cardiac Enzymes: Recent Labs  Lab 01/25/21 2234  CKTOTAL 146   BNP (last 3 results) No results for input(s): PROBNP in the last 8760 hours. HbA1C: No results for input(s): HGBA1C in the last 72 hours. CBG: Recent Labs  Lab 01/26/21 1055  GLUCAP 129*   Lipid Profile: No results for input(s): CHOL, HDL, LDLCALC, TRIG, CHOLHDL, LDLDIRECT in the last 72 hours. Thyroid Function Tests: No results for input(s): TSH, T4TOTAL, FREET4, T3FREE, THYROIDAB in the last 72 hours. Anemia Panel: No results for input(s): VITAMINB12, FOLATE, FERRITIN, TIBC, IRON, RETICCTPCT in the last 72 hours. Urine analysis:    Component Value Date/Time   COLORURINE YELLOW (A) 01/26/2021 0514   APPEARANCEUR  CLOUDY (A) 01/26/2021 0514   LABSPEC 1.014 01/26/2021 [place] 8.0 01/26/2021 [place] 01/26/2021 2774  HGBUR SMALL (A) 01/26/2021 0514   BILIRUBINUR NEGATIVE 01/26/2021 0514   KETONESUR 20 (A) 01/26/2021 0514   PROTEINUR 30 (A) 01/26/2021 0514   NITRITE NEGATIVE 01/26/2021 0514   LEUKOCYTESUR LARGE (A) 01/26/2021 0514   Sepsis Labs: @LABRCNTIP (procalcitonin:4,lacticidven:4) ) Recent Results (from the past 240 hour(s))  Resp Panel by RT-PCR (Flu A&B, Covid) Nasopharyngeal Swab     Status: None   Collection Time: 01/26/21  9:14 AM   Specimen: Nasopharyngeal Swab; Nasopharyngeal(NP) swabs in vial transport medium  Result Value Ref Range Status   SARS Coronavirus 2 by RT PCR NEGATIVE NEGATIVE Final    Comment: (NOTE) SARS-CoV-2 target nucleic acids are NOT DETECTED.  The SARS-CoV-2 RNA is generally detectable in upper respiratory specimens during the acute phase of infection. The lowest concentration of SARS-CoV-2 viral copies this assay can detect is 138 copies/mL. A negative result does not preclude SARS-Cov-2 infection and should not be used as the sole basis for treatment or other patient management decisions. A negative result may occur with  improper specimen collection/handling, submission of specimen other than nasopharyngeal swab, presence of viral mutation(s) within the areas targeted by this assay, and inadequate number of viral copies(<138 copies/mL). A negative result must be combined with clinical observations, patient history, and epidemiological information. The expected result is Negative.  Fact Sheet for Patients:  EntrepreneurPulse.com.au  Fact Sheet for Healthcare Providers:  IncredibleEmployment.be  This test is no t yet approved or cleared by the [place] FDA and  has been authorized for detection and/or diagnosis of SARS-CoV-2 by FDA under an Emergency Use Authorization (EUA). This EUA will  remain  in effect (meaning this test can be used) for the duration of the COVID-19 declaration under Section 564(b)(1) of the Act, 21 U.S.C.section 360bbb-3(b)(1), unless the authorization is terminated  or revoked sooner.       Influenza A by PCR NEGATIVE NEGATIVE Final   Influenza B by PCR NEGATIVE NEGATIVE Final    Comment: (NOTE) The Xpert Xpress SARS-CoV-2/FLU/RSV plus assay is intended as an aid in the diagnosis of influenza from Nasopharyngeal swab specimens and should not be used as a sole basis for treatment. Nasal washings and aspirates are unacceptable for Xpert Xpress SARS-CoV-2/FLU/RSV testing.  Fact Sheet for Patients: EntrepreneurPulse.com.au  Fact Sheet for Healthcare Providers: IncredibleEmployment.be  This test is not yet approved or cleared by the [place] FDA and has been authorized for detection and/or diagnosis of SARS-CoV-2 by FDA under an Emergency Use Authorization (EUA). This EUA will remain in effect (meaning this test can be used) for the duration of the COVID-19 declaration under Section 564(b)(1) of the Act, 21 U.S.C. section 360bbb-3(b)(1), unless the authorization is terminated or revoked.  Performed at [facility], [address] [place], [place] [address]      Radiological Exams on Admission: DG Pelvis 1-2 Views  Result Date: 01/25/2021 CLINICAL DATA:  Fall. EXAM: PELVIS - 1-2 VIEW COMPARISON:  Pelvic radiograph dated 04/15/2017. FINDINGS: There is no acute fracture or dislocation. The bones are osteopenic. Mild bilateral hip arthritic changes. The soft tissues are unremarkable. IMPRESSION: No acute fracture or dislocation. Electronically Signed   By: Anner Crete M.D.   On: 01/25/2021 22:53   CT HEAD WO CONTRAST (5MM)  Result Date: 01/25/2021 CLINICAL DATA:  Trauma. EXAM: CT HEAD WITHOUT CONTRAST CT CERVICAL SPINE WITHOUT CONTRAST TECHNIQUE: Multidetector CT imaging of the head and  cervical spine was performed following the standard protocol without intravenous contrast. Multiplanar CT image reconstructions of the  cervical spine were also generated. COMPARISON:  Head CT dated 12/21/2020. FINDINGS: CT HEAD FINDINGS Brain: The ventricles and sulci appropriate size for patient's age. Mild periventricular and deep white matter chronic microvascular ischemic changes noted. There is no acute intracranial hemorrhage. No mass effect or midline shift. No extra-axial fluid collection. Vascular: No hyperdense vessel or unexpected calcification. Skull: Normal. Negative for fracture or focal lesion. Sinuses/Orbits: No acute finding. Other: None CT CERVICAL SPINE FINDINGS Alignment: No acute subluxation. Skull base and vertebrae: No acute fracture. Soft tissues and spinal canal: No prevertebral fluid or swelling. No visible canal hematoma. Disc levels:  Mild degenerative changes. Upper chest: Negative. Other: None IMPRESSION: 1. No acute intracranial pathology. Mild chronic microvascular ischemic changes. 2. No acute/traumatic cervical spine pathology. Electronically Signed   By: Anner Crete M.D.   On: 01/25/2021 23:11   CT Cervical Spine Wo Contrast  Result Date: 01/25/2021 CLINICAL DATA:  Trauma. EXAM: CT HEAD WITHOUT CONTRAST CT CERVICAL SPINE WITHOUT CONTRAST TECHNIQUE: Multidetector CT imaging of the head and cervical spine was performed following the standard protocol without intravenous contrast. Multiplanar CT image reconstructions of the cervical spine were also generated. COMPARISON:  Head CT dated 12/21/2020. FINDINGS: CT HEAD FINDINGS Brain: The ventricles and sulci appropriate size for patient's age. Mild periventricular and deep white matter chronic microvascular ischemic changes noted. There is no acute intracranial hemorrhage. No mass effect or midline shift. No extra-axial fluid collection. Vascular: No hyperdense vessel or unexpected calcification. Skull: Normal. Negative for  fracture or focal lesion. Sinuses/Orbits: No acute finding. Other: None CT CERVICAL SPINE FINDINGS Alignment: No acute subluxation. Skull base and vertebrae: No acute fracture. Soft tissues and spinal canal: No prevertebral fluid or swelling. No visible canal hematoma. Disc levels:  Mild degenerative changes. Upper chest: Negative. Other: None IMPRESSION: 1. No acute intracranial pathology. Mild chronic microvascular ischemic changes. 2. No acute/traumatic cervical spine pathology. Electronically Signed   By: Anner Crete M.D.   On: 01/25/2021 23:11   DG Chest Portable 1 View  Result Date: 01/26/2021 CLINICAL DATA:  Fall. EXAM: PORTABLE CHEST 1 VIEW COMPARISON:  Chest radiograph dated 08/28/2020. FINDINGS: No focal consolidation, pleural effusion or pneumothorax. Mild cardiomegaly. Old healed right rib fractures. No acute osseous pathology. IMPRESSION: 1. No acute cardiopulmonary process. 2. Mild cardiomegaly. Electronically Signed   By: Anner Crete M.D.   On: 01/26/2021 00:08   DG Knee Complete 4 Views Left  Result Date: 01/25/2021 CLINICAL DATA:  Left knee pain. EXAM: LEFT KNEE - COMPLETE 4+ VIEW COMPARISON:  December 21, 2020 FINDINGS: A left knee replacement is seen. There is no evidence of surrounding lucency to suggest the presence of hardware loosening or infection. No evidence of acute fracture or dislocation. A small joint effusion is noted. IMPRESSION: 1. Left knee replacement without evidence of hardware loosening or infection. 2. Small joint effusion. Electronically Signed   By: Virgina Norfolk M.D.   On: 01/25/2021 22:55     EKG: I have personally reviewed.  Sinus rhythm, QTC 413, no ischemic change.  Assessment/Plan Principal Problem:   Severe sepsis with septic shock (HCC) Active Problems:   Hypothyroidism   GERD (gastroesophageal reflux disease)   UTI (urinary tract infection)   COPD (chronic obstructive pulmonary disease) ([place])   Fall at home, initial encounter   HTN  (hypertension)   Depression with anxiety   Acute metabolic encephalopathy   Severe sepsis with septic shock due to UTI: Initially patient had blood pressure 113/56, later on developed hypotension which  is resistant to IV fluid resuscitation.  After giving 4L of normal saline bolus, patient's blood pressure still in 75/59.  Lactic acid is elevated 1.3, 3.6.  Patient also has leukocytosis with WBC 17.1, tachycardia with heart rate in 96.  Started Levophed.  Consulted Dr. Vaughan Browner of ICU.  -Admitted to ICU -Continue Levophed -Follow-up of blood culture and urine culture -will get Procalcitonin and trend lactic acid levels per sepsis protocol. -IVF: total of 4L of NS bolus in ED, followed by 125 cc/h  -will give 100 mg of Solu-Cortef  Hypothyroidism -Synthroid  GERD (gastroesophageal reflux disease) -Pepcid  COPD (chronic obstructive pulmonary disease) ([place]) -Bronchodilators  Fall at home, initial encounter:  -PT/OT when able to (not ordered yet)  HTN (hypertension) -Hold all blood pressure medications  Depression with anxiety -Continue home medications  Acute metabolic encephalopathy -Frequent neuro check   Diarrhea: Patient is being worked up as outpatient by PCP.  Patient had negative C. difficile test 2 weeks ago.  Patient was treated with metronidazole empirically by PCP.  Stool culture is pending per PCP -As needed Imodium    DVT ppx: SQ Lovenox Code Status: partial code per her granddaughter and her PCP (CPR okay, no intubation) Family Communication:  Yes, patient's granddaughter by phone Disposition Plan:  Anticipate discharge back to previous environment Consults called:  none Admission status and Level of care: ICU:     as inpt         Status is: Inpatient  Remains inpatient appropriate because:Inpatient level of care appropriate due to severity of illness  Dispo: The patient is from: Home              Anticipated d/c is to: Home              Patient  currently is not medically stable to d/c.   Difficult to place patient No          Date of Service 01/26/2021    [facility]   If 7PM-7AM, please contact night-coverage [email or web address] 01/26/2021, 6:12 PM

## 2021-01-26 NOTE — ED Notes (Signed)
Lunch tray given. 

## 2021-01-26 NOTE — ED Notes (Signed)
Noted  slight  ecchymosis to Rt hip area ,  warmth, tenderness and swelling noted to  patient's lt knee

## 2021-01-27 ENCOUNTER — Other Ambulatory Visit: Payer: Self-pay

## 2021-01-27 LAB — GASTROINTESTINAL PANEL BY PCR, STOOL (REPLACES STOOL CULTURE)

## 2021-01-27 LAB — CBC
HCT: 38.8 % (ref 36.0–46.0)
Hemoglobin: 12.6 g/dL (ref 12.0–15.0)
MCH: 29.9 pg (ref 26.0–34.0)
MCHC: 32.5 g/dL (ref 30.0–36.0)
MCV: 91.9 fL (ref 80.0–100.0)
Platelets: 124 10*3/uL — ABNORMAL LOW (ref 150–400)
RBC: 4.22 MIL/uL (ref 3.87–5.11)
RDW: 16.2 % — ABNORMAL HIGH (ref 11.5–15.5)
WBC: 22.5 10*3/uL — ABNORMAL HIGH (ref 4.0–10.5)
nRBC: 0 % (ref 0.0–0.2)

## 2021-01-27 LAB — C DIFFICILE QUICK SCREEN W PCR REFLEX
C Diff antigen: NEGATIVE
C Diff interpretation: NOT DETECTED
C Diff toxin: NEGATIVE

## 2021-01-27 LAB — BASIC METABOLIC PANEL
Anion gap: 10 (ref 5–15)
BUN: 20 mg/dL (ref 8–23)
CO2: 20 mmol/L — ABNORMAL LOW (ref 22–32)
Calcium: 7.2 mg/dL — ABNORMAL LOW (ref 8.9–10.3)
Chloride: 108 mmol/L (ref 98–111)
Creatinine, Ser: 0.7 mg/dL (ref 0.44–1.00)
GFR, Estimated: >60 mL/min (ref >60)
Glucose, Bld: 149 mg/dL — ABNORMAL HIGH (ref 70–99)
Potassium: 3.8 mmol/L (ref 3.5–5.1)
Sodium: 138 mmol/L (ref 135–145)

## 2021-01-27 LAB — PROCALCITONIN: Procalcitonin: 18.11 ng/mL

## 2021-01-27 LAB — GLUCOSE, CAPILLARY
Glucose-Capillary: 124 mg/dL — ABNORMAL HIGH (ref 70–99)
Glucose-Capillary: 149 mg/dL — ABNORMAL HIGH (ref 70–99)
Glucose-Capillary: 210 mg/dL — ABNORMAL HIGH (ref 70–99)

## 2021-01-27 LAB — TSH: TSH: 1.092 u[IU]/mL (ref 0.350–4.500)

## 2021-01-27 LAB — CORTISOL: Cortisol, Plasma: >100 ug/dL

## 2021-01-27 MED ORDER — ADULT MULTIVITAMIN W/MINERALS CH
1.0000 | ORAL_TABLET | Freq: Every day | ORAL | Status: DC
  Administered 2021-01-27 – 2021-01-30 (×4): 1 via ORAL
  Filled 2021-01-27 (×4): qty 1

## 2021-01-27 MED ORDER — ENSURE ENLIVE PO LIQD
237.0000 mL | Freq: Three times a day (TID) | ORAL | Status: DC
  Administered 2021-01-27 – 2021-01-31 (×5): 237 mL via ORAL

## 2021-01-27 NOTE — Progress Notes (Addendum)
[id number] ID # [id number] translation code to speak spanish with patient. patient able to make all needs known bit of confusion 1000 complete ADL care done pt able to feed self

## 2021-01-27 NOTE — Plan of Care (Signed)

## 2021-01-27 NOTE — Progress Notes (Signed)
PROGRESS NOTE    Shelly Freeman  VCB:[id number] DOB: 02/06/1951 DOA: 01/25/2021 PCP: Janna Arch, MD   Brief Narrative:    70 y.o. female with medical history significant of hypertension, COPD, hypothyroidism, GERD, lupus, kidney stone, chronic back pain, OSA not on CPAP, positive blood culture with E. 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   Patient speaks Spanish.  History is obtained with help of iPad interpreter.  I also spoke with her granddaughter and her PCP on the phone.  Per report, patient fell in the early morning when she was going to the bathroom. She was laying on the right side for long time.  After fall, she complains of pain in the left hip. Actually, pt complains of pain all over, including both legs, both shoulders, both hands and left hip when I saw pt in ED. Patient is drowsy, mildly confused, but still oriented x3.  Patient moves all extremities.  No facial droop or slurred speech.  Patient states that she has mild shortness of breath, mild dry cough, no chest pain.  No fever or chills.  Patient reports dysuria, burning on urination, and increased urinary frequency.    Patient reports diarrhea.  No nausea, vomiting or abdominal pain. Of note, Dr. Coralie Common from [place] called me and provided  some important information, which is highly appreciated.  Per Dr. Ovid Curd, patient has been having diarrhea for several weeks.  They are currently doing work-up as outpatient for diarrhea.  Patient had negative C. difficile test 2 weeks ago.  Stool culture is pending.  Patient finished a course of Flagyl treatment empirically.  Her diarrhea has slightly improved.  Patient was admitted to stepdown status due to persistent hypotension.  Started on midodrine and stress dose steroids.  Also started on peripheral Levophed.  On the morning of 9/23 Levophed turned off as of 5 AM.  Patient's blood pressure  remained stable on just midodrine and IV hydrocortisone.  CT imaging survey significant for right-sided pyelonephritis.  Assessment & Plan:   Principal Problem:   Severe sepsis with septic shock (HCC) Active Problems:   Hypothyroidism   GERD (gastroesophageal reflux disease)   UTI (urinary tract infection)   COPD (chronic obstructive pulmonary disease) ([place])   Fall at home, initial encounter   HTN (hypertension)   Depression with anxiety   Acute metabolic encephalopathy  Severe sepsis with septic shock due to UTI and right-sided pyelonephritis  Initially patient had blood pressure 113/56, later on developed hypotension which is resistant to IV fluid resuscitation.   After giving 4L of normal saline bolus, patient's blood pressure still in 75/59.   Lactic acid is elevated 1.3, 3.6.   Patient also has leukocytosis with WBC 17.1, tachycardia with heart rate in 96.   Admitted to stepdown, peripheral Levophed started ICU consulted Levophed weaned off as of 5 AM on 9/23  Plan: Transfer to PCU Continue IV Rocephin Decrease rate of fluids to 75 cc/h Continue stress dose steroids Continue midodrine Trend procalcitonin Follow cultures Monitor vitals and fever curve   Hypothyroidism -Synthroid   GERD (gastroesophageal reflux disease) -Pepcid   COPD (chronic obstructive pulmonary disease) (HCC) -Bronchodilators   Fall at home, initial encounter:  -PT/OT when able to (not ordered yet)   HTN (hypertension) -Hold all blood pressure medications   Depression with anxiety -Continue home medications   Acute metabolic encephalopathy -Frequent neuro check     Diarrhea Chronic  diarrhea per outpatient PCP Negative C. difficile test 2 weeks ago and then again here Formed stool as of 923 Plan: DC isolation As needed Imodium  DVT prophylaxis: SQ Lovenox Code Status: Partial Family Communication: Granddaughter [phone] Disposition Plan: Status is: Inpatient  Remains  inpatient appropriate because:Inpatient level of care appropriate due to severity of illness  Dispo: The patient is from: Home              Anticipated d/c is to: Home              Patient currently is not medically stable to d/c.   Difficult to place patient No      . Level of care: Progressive Cardiac  Consultants:  ICU  Procedures:  None  Antimicrobials:  Ceftriaxone   Subjective: Patient seen and examined.  Sleeping but easily aroused.  History conducted in [place].  Appears chronically ill.  Objective: Vitals:   01/27/21 1100 01/27/21 1123 01/27/21 1130 01/27/21 1200  BP: 113/69 111/71  113/89  Pulse: 67 65  65  Resp: 19 12  17   Temp:   97.6 F (36.4 C)   TempSrc:   Oral   SpO2: 97% 96%  97%  Weight:      Height:        Intake/Output Summary (Last 24 hours) at 01/27/2021 1333 Last data filed at 01/27/2021 1300 Gross per 24 hour  Intake 5885.82 ml  Output 1201 ml  Net 4684.82 ml   Filed Weights   01/26/21 2110  Weight: 109.6 kg    Examination:  General exam: No acute distress.  Appears frail and chronically ill Respiratory system: Poor respiratory effort.  Lungs clear.  Normal work of breathing.  Room air Cardiovascular system: S1-S2, RRR, no murmurs, no pedal edema Gastrointestinal system: Soft, nondistended, mild tender to palpation suprapubic region, positive bowel sounds Central nervous system: Alert, oriented x2, no focal deficits Extremities: Symmetric 5 x 5 power. Skin: No rashes, lesions or ulcers Psychiatry: Judgement and insight appear impaired. Mood & affect flattened.     Data Reviewed: I have personally reviewed following labs and imaging studies  CBC: Recent Labs  Lab 01/25/21 2234 01/27/21 0641  WBC 17.1* 22.5*  HGB 14.2 12.6  HCT 44.8 38.8  MCV 93.5 91.9  PLT 157 829*   Basic Metabolic Panel: Recent Labs  Lab 01/25/21 2234 01/27/21 0641  NA 141 138  K 4.5 3.8  CL 106 108  CO2 27 20*  GLUCOSE 168* 149*  BUN 12  20  CREATININE 0.79 0.70  CALCIUM 8.4* 7.2*   GFR: Estimated Creatinine Clearance: 77 mL/min (by C-G formula based on SCr of 0.7 mg/dL). Liver Function Tests: Recent Labs  Lab 01/25/21 2234  AST 21  ALT 12  ALKPHOS 77  BILITOT 0.8  PROT 6.5  ALBUMIN 3.3*   No results for input(s): LIPASE, AMYLASE in the last 168 hours. No results for input(s): AMMONIA in the last 168 hours. Coagulation Profile: No results for input(s): INR, PROTIME in the last 168 hours. Cardiac Enzymes: Recent Labs  Lab 01/25/21 2234  CKTOTAL 146   BNP (last 3 results) No results for input(s): PROBNP in the last 8760 hours. HbA1C: No results for input(s): HGBA1C in the last 72 hours. CBG: Recent Labs  Lab 01/26/21 1055 01/26/21 2110 01/27/21 0851 01/27/21 1128  GLUCAP 129* 133* 124* 149*   Lipid Profile: No results for input(s): CHOL, HDL, LDLCALC, TRIG, CHOLHDL, LDLDIRECT in the last 72 hours. Thyroid Function Tests: Recent  Labs    01/27/21 0641  TSH 1.092   Anemia Panel: No results for input(s): VITAMINB12, FOLATE, FERRITIN, TIBC, IRON, RETICCTPCT in the last 72 hours. Sepsis Labs: Recent Labs  Lab 01/26/21 0915 01/26/21 0919 01/26/21 1458 01/26/21 1704 01/26/21 1918 01/27/21 0641  PROCALCITON 4.37  --   --   --   --  18.11  LATICACIDVEN  --  1.3 3.6* 1.5 1.5  --     Recent Results (from the past 240 hour(s))  Urine Culture     Status: Abnormal (Preliminary result)   Collection Time: 01/26/21  5:14 AM   Specimen: Urine, Clean Catch  Result Value Ref Range Status   Specimen Description   Final    URINE, CLEAN CATCH Performed at [facility], [address], [place], [place] [address]    Special Requests   Final    NONE Performed at [facility], [address] [place], [place] [address]    Culture (A)  Final    >=100,000 COLONIES/mL Lonell Grandchild NEGATIVE RODS SUSCEPTIBILITIES TO FOLLOW Performed at [facility], [place] [address] [place], [place]  [address]    Report Status PENDING  Incomplete  Resp Panel by RT-PCR (Flu A&B, Covid) Nasopharyngeal Swab     Status: None   Collection Time: 01/26/21  9:14 AM   Specimen: Nasopharyngeal Swab; Nasopharyngeal(NP) swabs in vial transport medium  Result Value Ref Range Status   SARS Coronavirus 2 by RT PCR NEGATIVE NEGATIVE Final    Comment: (NOTE) SARS-CoV-2 target nucleic acids are NOT DETECTED.  The SARS-CoV-2 RNA is generally detectable in upper respiratory specimens during the acute phase of infection. The lowest concentration of SARS-CoV-2 viral copies this assay can detect is 138 copies/mL. A negative result does not preclude SARS-Cov-2 infection and should not be used as the sole basis for treatment or other patient management decisions. A negative result may occur with  improper specimen collection/handling, submission of specimen other than nasopharyngeal swab, presence of viral mutation(s) within the areas targeted by this assay, and inadequate number of viral copies(<138 copies/mL). A negative result must be combined with clinical observations, patient history, and epidemiological information. The expected result is Negative.  Fact Sheet for Patients:  EntrepreneurPulse.com.au  Fact Sheet for Healthcare Providers:  IncredibleEmployment.be  This test is no t yet approved or cleared by the [place] FDA and  has been authorized for detection and/or diagnosis of SARS-CoV-2 by FDA under an Emergency Use Authorization (EUA). This EUA will remain  in effect (meaning this test can be used) for the duration of the COVID-19 declaration under Section 564(b)(1) of the Act, 21 U.S.C.section 360bbb-3(b)(1), unless the authorization is terminated  or revoked sooner.       Influenza A by PCR NEGATIVE NEGATIVE Final   Influenza B by PCR NEGATIVE NEGATIVE Final    Comment: (NOTE) The Xpert Xpress SARS-CoV-2/FLU/RSV plus assay is intended as an  aid in the diagnosis of influenza from Nasopharyngeal swab specimens and should not be used as a sole basis for treatment. Nasal washings and aspirates are unacceptable for Xpert Xpress SARS-CoV-2/FLU/RSV testing.  Fact Sheet for Patients: EntrepreneurPulse.com.au  Fact Sheet for Healthcare Providers: IncredibleEmployment.be  This test is not yet approved or cleared by the [place] FDA and has been authorized for detection and/or diagnosis of SARS-CoV-2 by FDA under an Emergency Use Authorization (EUA). This EUA will remain in effect (meaning this test can be used) for the duration of the COVID-19 declaration under Section  564(b)(1) of the Act, 21 U.S.C. section 360bbb-3(b)(1), unless the authorization is terminated or revoked.  Performed at [facility], [address] [place], [place] [address]   Culture, blood (x 2)     Status: None (Preliminary result)   Collection Time: 01/26/21  9:15 AM   Specimen: BLOOD  Result Value Ref Range Status   Specimen Description BLOOD  RAC  Final   Special Requests   Final    BOTTLES DRAWN AEROBIC AND ANAEROBIC Blood Culture results may not be optimal due to an inadequate volume of blood received in culture bottles   Culture   Final    NO GROWTH < 24 HOURS Performed at [facility], [address] [place], [place] [address]    Report Status PENDING  Incomplete  Culture, blood (x 2)     Status: None (Preliminary result)   Collection Time: 01/26/21  2:58 PM   Specimen: BLOOD  Result Value Ref Range Status   Specimen Description BLOOD BLOOD LEFT HAND  Final   Special Requests   Final    BOTTLES DRAWN AEROBIC AND ANAEROBIC Blood Culture adequate volume   Culture   Final    NO GROWTH < 24 HOURS Performed at [facility], [address], [place], [place] [address]    Report Status PENDING  Incomplete  MRSA Next Gen by PCR, Nasal     Status: Abnormal   Collection Time:  01/26/21  9:25 PM   Specimen: Nasal Mucosa; Nasal Swab  Result Value Ref Range Status   MRSA by PCR Next Gen DETECTED (A) NOT DETECTED Final    Comment: RESULT CALLED TO, READ BACK BY AND VERIFIED WITH: Trina Ao RN [phone] 01/26/21 HNM Performed at [facility], [place], [name] [address]   C Difficile Quick Screen w PCR reflex     Status: None   Collection Time: 01/27/21  6:35 AM   Specimen: STOOL  Result Value Ref Range Status   C Diff antigen NEGATIVE NEGATIVE Final   C Diff toxin NEGATIVE NEGATIVE Final   C Diff interpretation No C. difficile detected.  Final    Comment: Performed at [facility], [address] [place], [place] [address]  Gastrointestinal Panel by PCR , Stool     Status: None   Collection Time: 01/27/21  6:35 AM   Specimen: STOOL  Result Value Ref Range Status   Campylobacter species NOT DETECTED NOT DETECTED Final   Plesimonas shigelloides NOT DETECTED NOT DETECTED Final   Salmonella species NOT DETECTED NOT DETECTED Final   Yersinia enterocolitica NOT DETECTED NOT DETECTED Final   Vibrio species NOT DETECTED NOT DETECTED Final   Vibrio cholerae NOT DETECTED NOT DETECTED Final   Enteroaggregative E coli (EAEC) NOT DETECTED NOT DETECTED Final   Enteropathogenic E coli (EPEC) NOT DETECTED NOT DETECTED Final   Enterotoxigenic E coli (ETEC) NOT DETECTED NOT DETECTED Final   Shiga like toxin producing E coli (STEC) NOT DETECTED NOT DETECTED Final   Shigella/Enteroinvasive E coli (EIEC) NOT DETECTED NOT DETECTED Final   Cryptosporidium NOT DETECTED NOT DETECTED Final   Cyclospora cayetanensis NOT DETECTED NOT DETECTED Final   Entamoeba histolytica NOT DETECTED NOT DETECTED Final   Giardia lamblia NOT DETECTED NOT DETECTED Final   Adenovirus F40/41 NOT DETECTED NOT DETECTED Final   Astrovirus NOT DETECTED NOT DETECTED Final   Norovirus GI/GII NOT DETECTED NOT DETECTED Final   Rotavirus A NOT DETECTED NOT DETECTED Final  Sapovirus (I, II, IV, and V) NOT DETECTED NOT DETECTED Final    Comment: Performed at [facility], [address] [place], [place] [address]         Radiology Studies: CT ABDOMEN PELVIS WO CONTRAST  Result Date: 01/26/2021 CLINICAL DATA:  Abdominal pain. EXAM: CT ABDOMEN AND PELVIS WITHOUT CONTRAST TECHNIQUE: Multidetector CT imaging of the abdomen and pelvis was performed following the standard protocol without IV contrast. COMPARISON:  August 28, 2020 FINDINGS: Lower chest: Mild atelectasis is seen within the bilateral lung bases. Hepatobiliary: No focal liver abnormality is seen. Tiny, ill-defined gallstones are seen within the lumen of a moderately distended gallbladder. There is no evidence of gallbladder wall thickening or pericholecystic inflammation. Pancreas: Unremarkable. No pancreatic ductal dilatation or surrounding inflammatory changes. Spleen: Normal in size without focal abnormality. Adrenals/Urinary Tract: Adrenal glands are unremarkable. Kidneys are normal in size, without focal lesions. Prominent bilateral extrarenal pelvis are seen without evidence of renal calculi. Moderate to marked severity right-sided perinephric inflammatory fat stranding is noted. This represents a new finding when compared to the prior study. Bladder is unremarkable. Stomach/Bowel: There is a small hiatal hernia. Surgical sutures are seen within the gastric region. Surgically anastomosed bowel is also seen within the anterior aspect of the mid to upper left abdomen. The appendix is surgically absent. No evidence of bowel wall thickening, distention, or inflammatory changes. Vascular/Lymphatic: No significant vascular findings are present. No enlarged abdominal or pelvic lymph nodes. Reproductive: Uterus and bilateral adnexa are unremarkable. Other: No abdominal wall hernia or abnormality. No abdominopelvic ascites. Musculoskeletal: Multilevel degenerative changes seen throughout the lumbar spine.  IMPRESSION: 1. Findings concerning for the presence of acute pyelonephritis involving the right kidney. Correlation with urinalysis is recommended. 2. Cholelithiasis without evidence of acute cholecystitis. 3. Small hiatal hernia. 4. Evidence of prior gastric and small bowel surgery. Electronically Signed   By: Virgina Norfolk M.D.   On: 01/26/2021 21:11   DG Pelvis 1-2 Views  Result Date: 01/25/2021 CLINICAL DATA:  Fall. EXAM: PELVIS - 1-2 VIEW COMPARISON:  Pelvic radiograph dated 04/15/2017. FINDINGS: There is no acute fracture or dislocation. The bones are osteopenic. Mild bilateral hip arthritic changes. The soft tissues are unremarkable. IMPRESSION: No acute fracture or dislocation. Electronically Signed   By: Anner Crete M.D.   On: 01/25/2021 22:53   CT HEAD WO CONTRAST (5MM)  Result Date: 01/25/2021 CLINICAL DATA:  Trauma. EXAM: CT HEAD WITHOUT CONTRAST CT CERVICAL SPINE WITHOUT CONTRAST TECHNIQUE: Multidetector CT imaging of the head and cervical spine was performed following the standard protocol without intravenous contrast. Multiplanar CT image reconstructions of the cervical spine were also generated. COMPARISON:  Head CT dated 12/21/2020. FINDINGS: CT HEAD FINDINGS Brain: The ventricles and sulci appropriate size for patient's age. Mild periventricular and deep white matter chronic microvascular ischemic changes noted. There is no acute intracranial hemorrhage. No mass effect or midline shift. No extra-axial fluid collection. Vascular: No hyperdense vessel or unexpected calcification. Skull: Normal. Negative for fracture or focal lesion. Sinuses/Orbits: No acute finding. Other: None CT CERVICAL SPINE FINDINGS Alignment: No acute subluxation. Skull base and vertebrae: No acute fracture. Soft tissues and spinal canal: No prevertebral fluid or swelling. No visible canal hematoma. Disc levels:  Mild degenerative changes. Upper chest: Negative. Other: None IMPRESSION: 1. No acute intracranial  pathology. Mild chronic microvascular ischemic changes. 2. No acute/traumatic cervical spine pathology. Electronically Signed   By: Anner Crete M.D.   On: 01/25/2021 23:11   CT Cervical Spine Wo  Contrast  Result Date: 01/25/2021 CLINICAL DATA:  Trauma. EXAM: CT HEAD WITHOUT CONTRAST CT CERVICAL SPINE WITHOUT CONTRAST TECHNIQUE: Multidetector CT imaging of the head and cervical spine was performed following the standard protocol without intravenous contrast. Multiplanar CT image reconstructions of the cervical spine were also generated. COMPARISON:  Head CT dated 12/21/2020. FINDINGS: CT HEAD FINDINGS Brain: The ventricles and sulci appropriate size for patient's age. Mild periventricular and deep white matter chronic microvascular ischemic changes noted. There is no acute intracranial hemorrhage. No mass effect or midline shift. No extra-axial fluid collection. Vascular: No hyperdense vessel or unexpected calcification. Skull: Normal. Negative for fracture or focal lesion. Sinuses/Orbits: No acute finding. Other: None CT CERVICAL SPINE FINDINGS Alignment: No acute subluxation. Skull base and vertebrae: No acute fracture. Soft tissues and spinal canal: No prevertebral fluid or swelling. No visible canal hematoma. Disc levels:  Mild degenerative changes. Upper chest: Negative. Other: None IMPRESSION: 1. No acute intracranial pathology. Mild chronic microvascular ischemic changes. 2. No acute/traumatic cervical spine pathology. Electronically Signed   By: Anner Crete M.D.   On: 01/25/2021 23:11   DG Chest Portable 1 View  Result Date: 01/26/2021 CLINICAL DATA:  Fall. EXAM: PORTABLE CHEST 1 VIEW COMPARISON:  Chest radiograph dated 08/28/2020. FINDINGS: No focal consolidation, pleural effusion or pneumothorax. Mild cardiomegaly. Old healed right rib fractures. No acute osseous pathology. IMPRESSION: 1. No acute cardiopulmonary process. 2. Mild cardiomegaly. Electronically Signed   By: Anner Crete  M.D.   On: 01/26/2021 00:08   DG Knee Complete 4 Views Left  Result Date: 01/25/2021 CLINICAL DATA:  Left knee pain. EXAM: LEFT KNEE - COMPLETE 4+ VIEW COMPARISON:  December 21, 2020 FINDINGS: A left knee replacement is seen. There is no evidence of surrounding lucency to suggest the presence of hardware loosening or infection. No evidence of acute fracture or dislocation. A small joint effusion is noted. IMPRESSION: 1. Left knee replacement without evidence of hardware loosening or infection. 2. Small joint effusion. Electronically Signed   By: Virgina Norfolk M.D.   On: 01/25/2021 22:55        Scheduled Meds:  Chlorhexidine Gluconate Cloth  6 each Topical Q0600   enoxaparin (LOVENOX) injection  40 mg Subcutaneous Q24H   escitalopram  20 mg Oral Daily   famotidine  20 mg Oral BID   feeding supplement  237 mL Oral TID BM   ferrous sulfate  325 mg Oral Q M,W,F   folic acid  1 mg Oral Daily   gabapentin  800 mg Oral BID   hydrocortisone sodium succinate  100 mg Intravenous Q8H   levothyroxine  75 mcg Oral QAC breakfast   loperamide  2 mg Oral Q1200   midodrine  10 mg Oral TID WC   multivitamin with minerals  1 tablet Oral Daily   mupirocin ointment  1 application Nasal BID   OLANZapine  7.5 mg Oral QHS   traZODone  125 mg Oral QHS   Continuous Infusions:  sodium chloride Stopped (01/27/21 1007)   cefTRIAXone (ROCEPHIN)  IV Stopped (01/27/21 4627)   lactated ringers 75 mL/hr at 01/27/21 1300   metronidazole Stopped (01/27/21 1005)   norepinephrine (LEVOPHED) Adult infusion Stopped (01/27/21 0518)     LOS: 1 day    Time spent: 35 minutes    Sidney Ace, MD [facility] Pager [phone]  If 7PM-7AM, please contact night-coverage 01/27/2021, 1:33 PM

## 2021-01-27 NOTE — Progress Notes (Addendum)
Initial Nutrition Assessment  DOCUMENTATION CODES:   Morbid obesity  INTERVENTION:   -MVI with minerals daily -Ensure Enlive po BID, each supplement provides 350 kcal and 20 grams of protein  -Liberalize diet to regular to offer widest variety of food selections  NUTRITION DIAGNOSIS:   Inadequate oral intake related to decreased appetite as evidenced by per patient/family report, meal completion < 50%.  GOAL:   Patient will meet greater than or equal to 90% of their needs  MONITOR:   PO intake, Supplement acceptance, Labs, Weight trends, Skin, I & O's  REASON FOR ASSESSMENT:   Malnutrition Screening Tool    ASSESSMENT:   Shelly Freeman is a 70 y.o. female with medical history significant of hypertension, COPD, hypothyroidism, GERD, lupus, kidney stone, chronic back pain, OSA not on CPAP, positive blood culture with E. 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  Pt admitted with severe sepsis with septic shock secondary to UTI.   Reviewed I/O's: +4.1 L x 24 hours and +4.2 L since admission  UOP: 800 ml x 24 hours  Case discussed with RN, who reports pt is C-diff negative. She shares that pt is able to communicate her needs in [place], but uses the [employer] interpreter for more advanced concepts.   Spoke with pt at bedside, who reports feeling better today. She complains on being cold- RD provided pt with warm blanket. She reports she has had a poor appetite for the past 2 days due to feeling unwell. Per her report, she has not eaten anything today. Noted meal completions documented at 50%. Pt reports she has been drinking a lot of fluids, mostly water.  Per H&P, pt reports having diarrhea and decreased oral intake PTA.   Pt shares that she typically has a very good appetite. She consumes 3 meals per day (Breakfast: eggs and potatoes; Lunch: soup OR chicken, beans, and rice; Dinner:  meat, starch, and vegetable).   Pt denies any weight loss. Noted history of weight gain; suspect edema may be contributing.   Discussed importance of good meal and supplement intake to promote healing.   Medications reviewed ad include ferrous sulfate, folvite, solu-cortef, iodium, lactated ringers infusion @ 125 ml/hr, and levophed.   Labs reviewed: CBGS: 124 (inpatient orders for glycemic control are ).    NUTRITION - FOCUSED PHYSICAL EXAM:  Flowsheet Row Most Recent Value  Orbital Region No depletion  Upper Arm Region No depletion  Thoracic and Lumbar Region No depletion  Buccal Region No depletion  Temple Region No depletion  Clavicle Bone Region No depletion  Clavicle and Acromion Bone Region No depletion  Scapular Bone Region No depletion  Dorsal Hand No depletion  Patellar Region No depletion  Anterior Thigh Region No depletion  Posterior Calf Region No depletion  Edema (RD Assessment) Moderate  Hair Reviewed  Eyes Reviewed  Mouth Reviewed  Skin Reviewed  Nails Reviewed       Diet Order:   Diet Order             Diet Heart Room service appropriate? Yes; Fluid consistency: Thin  Diet effective now                   EDUCATION NEEDS:   Education needs have been addressed  Skin:  Skin Assessment: Reviewed RN Assessment  Last BM:  01/27/21 (type 5)  Height:   Ht Readings from Last 1 Encounters:  01/26/21  5\' 1"  (1.549 m)    Weight:   Wt Readings from Last 1 Encounters:  01/26/21 109.6 kg    Ideal Body Weight:  47.7 kg  BMI:  Body mass index is 45.65 kg/m.  Estimated Nutritional Needs:   Kcal:  1500-1700  Protein:  80-95 grams  Fluid:  > 1.5 L    Loistine Chance, RD, LDN, [place] Registered Dietitian II Certified Diabetes Care and Education Specialist Please refer to [facility] for RD and/or RD on-call/weekend/after hours pager

## 2021-01-28 LAB — URINE CULTURE: Culture: >=100000 — AB

## 2021-01-28 LAB — GLUCOSE, CAPILLARY: Glucose-Capillary: 158 mg/dL — ABNORMAL HIGH (ref 70–99)

## 2021-01-28 LAB — PROCALCITONIN: Procalcitonin: 12.88 ng/mL

## 2021-01-28 MED ORDER — SODIUM CHLORIDE 0.9 % IV SOLN
2.0000 g | Freq: Three times a day (TID) | INTRAVENOUS | Status: DC
  Administered 2021-01-28 – 2021-01-30 (×6): 2 g via INTRAVENOUS
  Filled 2021-01-28 (×8): qty 2

## 2021-01-28 MED ORDER — HYDROCORTISONE SOD SUC (PF) 100 MG IJ SOLR
75.0000 mg | Freq: Two times a day (BID) | INTRAMUSCULAR | Status: DC
  Administered 2021-01-28 – 2021-01-29 (×2): 75 mg via INTRAVENOUS
  Filled 2021-01-28 (×3): qty 1.5

## 2021-01-28 MED ORDER — MIDODRINE HCL 5 MG PO TABS: 5.0000 mg | ORAL_TABLET | Freq: Three times a day (TID) | ORAL | Status: DC

## 2021-01-28 MED ORDER — HYDROCORTISONE SOD SUC (PF) 100 MG IJ SOLR: 50.0000 mg | Freq: Three times a day (TID) | INTRAMUSCULAR | Status: DC

## 2021-01-28 MED ORDER — METHOCARBAMOL 500 MG PO TABS
500.0000 mg | ORAL_TABLET | Freq: Four times a day (QID) | ORAL | Status: DC | PRN
  Administered 2021-01-28 – 2021-01-31 (×4): 500 mg via ORAL
  Filled 2021-01-28 (×5): qty 1

## 2021-01-28 NOTE — Evaluation (Signed)
Physical Therapy Evaluation Patient Details Name: Shelly Freeman MRN: [id number] DOB: [birth year] Today's Date: 01/28/2021  History of Present Illness  Pt is a 70 y/o F admitted on 01/25/21 after presenting with c/o fall & AMS. CT imaging significant for R sided pyelonephritis. Pt being treated for severe sepsis with septic shock 2/2 UTI & R sided pyelonephritis. PMH: HTN, COPD, hypothyroidism, GERD, lupus, kideny stone, chronic back pain, OSA not on CPAP, dementia, L TKA 10/25/20, systemic lupus  Clinical Impression  Pt seen for PT evaluation with professional interpreter present. Pt requires min assist for bed mobility, supervision<>CGA for transfers, and supervision<>CGA for gait with RW to door & back of room. Pt demonstrates impaired gait pattern as noted below, with pt endorsing significant knee pain (6/10) but notes this is an improvement since prior to her TKA in June. Pt requesting to go back to bed at end of session with PT encouraging her to sit up as much as possible. Pt would benefit from STR upon d/c to maximize independence with functional mobility & reduce fall risk prior to return home alone.        Recommendations for follow up therapy are one component of a multi-disciplinary discharge planning process, led by the attending physician.  Recommendations may be updated based on patient status, additional functional criteria and insurance authorization.  Follow Up Recommendations SNF;Supervision for mobility/OOB    Equipment Recommendations  None recommended by PT    Recommendations for Other Services       Precautions / Restrictions Precautions Precautions: Fall Restrictions Weight Bearing Restrictions: No      Mobility  Bed Mobility Overal bed mobility: Needs Assistance Bed Mobility: Supine to Sit     Supine to sit: Min assist;HOB elevated     General bed mobility comments: cuing to use bed rails, extra time to complete bed mobility    Transfers Overall  transfer level: Needs assistance Equipment used: Rolling walker (2 wheeled) Transfers: Sit to/from Stand Sit to Stand: Min guard;Supervision         General transfer comment: min assist for sit>stand from sitting EOB, supervision sit>stand from recliner  Ambulation/Gait Ambulation/Gait assistance: Min guard;Supervision Gait Distance (Feet): 40 Feet Assistive device: Rolling walker (2 wheeled) Gait Pattern/deviations: Step-to pattern;Decreased stance time - left;Decreased stride length;Decreased step length - left;Decreased dorsiflexion - left;Decreased dorsiflexion - right;Decreased step length - right Gait velocity: significantly decreased   General Gait Details: decreased heel strike BLE, significantly decreased step length BLE  Stairs            Wheelchair Mobility    Modified Rankin (Stroke Patients Only)       Balance Overall balance assessment: Needs assistance Sitting-balance support: Bilateral upper extremity supported;Feet supported Sitting balance-Leahy Scale: Good     Standing balance support: Bilateral upper extremity supported;During functional activity Standing balance-Leahy Scale: Fair Standing balance comment: BUE support on RW                             Pertinent Vitals/Pain Pain Assessment: Faces Faces Pain Scale: Hurts even more Pain Location: L knee Pain Descriptors / Indicators: Discomfort Pain Intervention(s): Limited activity within patient's tolerance;Monitored during session;Patient requesting pain meds-RN notified    Home Living Family/patient expects to be discharged to:: Private residence Living Arrangements: Alone Available Help at Discharge:  (none) Type of Home: Apartment Home Access: Level entry     Home Layout: One level Home Equipment: [place] - 2 wheels  Prior Function           Comments: Pt reports she ambulates with RW, cooks & bathes/dresses herself but also endorses living a sedentary life. Pt  endorses 2 falls prior to admission.     Hand Dominance        Extremity/Trunk Assessment   Upper Extremity Assessment Upper Extremity Assessment: Generalized weakness    Lower Extremity Assessment Lower Extremity Assessment: Generalized weakness       Communication   Communication: Interpreter utilized (In person interpreter present Comptroller))  Cognition Arousal/Alertness: Awake/alert Behavior During Therapy: WFL for tasks assessed/performed Overall Cognitive Status: Within Functional Limits for tasks assessed                                        General Comments General comments (skin integrity, edema, etc.): HR up to 86 bpm with activity    Exercises     Assessment/Plan    PT Assessment Patient needs continued PT services  PT Problem List Decreased strength;Decreased mobility;Decreased safety awareness;Decreased balance;Pain;Decreased activity tolerance;Obesity       PT Treatment Interventions DME instruction;Therapeutic exercise;Gait training;Manual techniques;Balance training;Neuromuscular re-education;Modalities;Functional mobility training;Therapeutic activities;Patient/family education    PT Goals (Current goals can be found in the Care Plan section)  Acute Rehab PT Goals Patient Stated Goal: decreased pain PT Goal Formulation: With patient Time For Goal Achievement: 02/11/21 Potential to Achieve Goals: Good    Frequency Min 2X/week   Barriers to discharge Decreased caregiver support lives alone, no assist at d/c    Co-evaluation               AM-PAC PT "6 Clicks" Mobility  Outcome Measure Help needed turning from your back to your side while in a flat bed without using bedrails?: A Little Help needed moving from lying on your back to sitting on the side of a flat bed without using bedrails?: A Lot Help needed moving to and from a bed to a chair (including a wheelchair)?: A Little Help needed standing up from a  chair using your arms (e.g., wheelchair or bedside chair)?: A Little Help needed to walk in hospital room?: A Little Help needed climbing 3-5 steps with a railing? : A Lot 6 Click Score: 16    End of Session Equipment Utilized During Treatment: Gait belt Activity Tolerance: Patient tolerated treatment well;Patient limited by pain Patient left: with chair alarm set;in chair;with call bell/phone within reach Nurse Communication: Mobility status (c/o pain & request for pain medication) PT Visit Diagnosis: Difficulty in walking, not elsewhere classified (R26.2);Muscle weakness (generalized) (M62.81);Pain Pain - Right/Left: Left Pain - part of body: Knee    Time: 8502-7741 PT Time Calculation (min) (ACUTE ONLY): 20 min   Charges:   PT Evaluation $PT Eval Low Complexity: 1 Low PT Treatments $Therapeutic Activity: 8-22 mins        Lavone Nian, PT, DPT 01/28/21, 2:25 PM   Waunita Schooner 01/28/2021, 2:23 PM

## 2021-01-28 NOTE — Progress Notes (Signed)
PROGRESS NOTE    Shelly Freeman  ENI:[id number] DOB: February 12, 1951 DOA: 01/25/2021 PCP: Janna Arch, MD   Brief Narrative:    70 y.o. female with medical history significant of hypertension, COPD, hypothyroidism, GERD, lupus, kidney stone, chronic back pain, OSA not on CPAP, positive blood culture with E. 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   Patient speaks Spanish.  History is obtained with help of iPad interpreter.  I also spoke with her granddaughter and her PCP on the phone.  Per report, patient fell in the early morning when she was going to the bathroom. She was laying on the right side for long time.  After fall, she complains of pain in the left hip. Actually, pt complains of pain all over, including both legs, both shoulders, both hands and left hip when I saw pt in ED. Patient is drowsy, mildly confused, but still oriented x3.  Patient moves all extremities.  No facial droop or slurred speech.  Patient states that she has mild shortness of breath, mild dry cough, no chest pain.  No fever or chills.  Patient reports dysuria, burning on urination, and increased urinary frequency.    Patient reports diarrhea.  No nausea, vomiting or abdominal pain. Of note, Dr. Coralie Common from [facility] called me and provided  some important information, which is highly appreciated.  Per Dr. Ovid Curd, patient has been having diarrhea for several weeks.  They are currently doing work-up as outpatient for diarrhea.  Patient had negative C. difficile test 2 weeks ago.  Stool culture is pending.  Patient finished a course of Flagyl treatment empirically.  Her diarrhea has slightly improved.  Patient was admitted to stepdown status due to persistent hypotension.  Started on midodrine and stress dose steroids.  Also started on peripheral Levophed.  On the morning of 9/23 Levophed turned off as of 5 AM.  Patient's blood pressure  remained stable on just midodrine and IV hydrocortisone.  CT imaging survey significant for right-sided pyelonephritis.  Patient's blood pressure stabilized and she was transferred of ICU service on 9/23.  Assessment & Plan:   Principal Problem:   Severe sepsis with septic shock (HCC) Active Problems:   Hypothyroidism   GERD (gastroesophageal reflux disease)   UTI (urinary tract infection)   COPD (chronic obstructive pulmonary disease) ([place])   Fall at home, initial encounter   HTN (hypertension)   Depression with anxiety   Acute metabolic encephalopathy  Severe sepsis with septic shock due to UTI and right-sided pyelonephritis  Initially patient had blood pressure 113/56, later on developed hypotension which is resistant to IV fluid resuscitation.   After giving 4L of normal saline bolus, patient's blood pressure still in 75/59.   Lactic acid is elevated 1.3, 3.6.   Patient also has leukocytosis with WBC 17.1, tachycardia with heart rate in 96.   Admitted to stepdown, peripheral Levophed started ICU consulted Levophed weaned off as of 5 AM on 9/23 Transferred to PCU 9/23  Plan: Continue IV Rocephin Continue normal saline 75 cc/h Decrease dose of hydrocortisone 50 every 8, continue to taper Decrease dose of midodrine 5 3 times daily, continue to taper Follow cultures Monitor vitals and fever curve   Hypothyroidism -Synthroid   GERD (gastroesophageal reflux disease) -Pepcid   COPD (chronic obstructive pulmonary disease) ([place]) -Bronchodilators   Fall at home, initial encounter:  -Therapy ordered 9/24   HTN (hypertension) -Hold all blood pressure medications  Depression with anxiety -Continue home medications   Acute metabolic encephalopathy -Frequent neuro check     Diarrhea Chronic diarrhea per outpatient PCP Negative C. difficile test 2 weeks ago and then again here Formed stool as of 923 Plan: DC isolation As needed Imodium  DVT prophylaxis: SQ  Lovenox Code Status: Partial Family Communication: Granddaughter [phone] on 9/23 Disposition Plan: Status is: Inpatient  Remains inpatient appropriate because:Inpatient level of care appropriate due to severity of illness  Dispo: The patient is from: Home              Anticipated d/c is to: Home              Patient currently is not medically stable to d/c.   Difficult to place patient No      . Level of care: Progressive Cardiac  Consultants:  ICU  Procedures:  None  Antimicrobials:  Ceftriaxone   Subjective: Patient seen and examined.  Sitting up in bed.  Visibly no distress.  Does endorse pain to bilateral lower extremities, neck, back.  History conducted in [place].  Objective: Vitals:   01/28/21 0700 01/28/21 0737 01/28/21 0811 01/28/21 1137  BP:  132/69 (!) 148/80 (!) 153/92  Pulse: (!) 55 (!) 55 (!) 56 (!) 50  Resp: 16 16 19 20   Temp:  98.1 F (36.7 C) (!) 96.6 F (35.9 C) 98 F (36.7 C)  TempSrc:  Oral    SpO2: 96% 96% 95% 95%  Weight:      Height:        Intake/Output Summary (Last 24 hours) at 01/28/2021 1149 Last data filed at 01/28/2021 1030 Gross per 24 hour  Intake 2243.93 ml  Output 600 ml  Net 1643.93 ml   Filed Weights   01/26/21 2110  Weight: 109.6 kg    Examination:  General exam: No acute distress.  Sitting up in bed eating breakfast Respiratory system: Lungs clear.  Normal work of breathing.  Room air Cardiovascular system: S1-S2, RRR, no murmurs, no pedal edema Gastrointestinal system: Soft, nondistended, mild tender to palpation suprapubic region, positive bowel sounds Central nervous system: Alert, oriented x2, no focal deficits Extremities: Symmetric 5 x 5 power. Skin: No rashes, lesions or ulcers Psychiatry: Judgement and insight appear normal. Mood & affect flattened     Data Reviewed: I have personally reviewed following labs and imaging studies  CBC: Recent Labs  Lab 01/25/21 2234 01/27/21 0641  WBC 17.1*  22.5*  HGB 14.2 12.6  HCT 44.8 38.8  MCV 93.5 91.9  PLT 157 093*   Basic Metabolic Panel: Recent Labs  Lab 01/25/21 2234 01/27/21 0641  NA 141 138  K 4.5 3.8  CL 106 108  CO2 27 20*  GLUCOSE 168* 149*  BUN 12 20  CREATININE 0.79 0.70  CALCIUM 8.4* 7.2*   GFR: Estimated Creatinine Clearance: 77 mL/min (by C-G formula based on SCr of 0.7 mg/dL). Liver Function Tests: Recent Labs  Lab 01/25/21 2234  AST 21  ALT 12  ALKPHOS 77  BILITOT 0.8  PROT 6.5  ALBUMIN 3.3*   No results for input(s): LIPASE, AMYLASE in the last 168 hours. No results for input(s): AMMONIA in the last 168 hours. Coagulation Profile: No results for input(s): INR, PROTIME in the last 168 hours. Cardiac Enzymes: Recent Labs  Lab 01/25/21 2234  CKTOTAL 146   BNP (last 3 results) No results for input(s): PROBNP in the last 8760 hours. HbA1C: No results for input(s): HGBA1C in the last 72 hours.  CBG: Recent Labs  Lab 01/26/21 1055 01/26/21 2110 01/27/21 0851 01/27/21 1128 01/27/21 1637  GLUCAP 129* 133* 124* 149* 210*   Lipid Profile: No results for input(s): CHOL, HDL, LDLCALC, TRIG, CHOLHDL, LDLDIRECT in the last 72 hours. Thyroid Function Tests: Recent Labs    01/27/21 0641  TSH 1.092   Anemia Panel: No results for input(s): VITAMINB12, FOLATE, FERRITIN, TIBC, IRON, RETICCTPCT in the last 72 hours. Sepsis Labs: Recent Labs  Lab 01/26/21 0915 01/26/21 0919 01/26/21 1458 01/26/21 1704 01/26/21 1918 01/27/21 0641 01/28/21 0415  PROCALCITON 4.37  --   --   --   --  18.11 12.88  LATICACIDVEN  --  1.3 3.6* 1.5 1.5  --   --     Recent Results (from the past 240 hour(s))  Urine Culture     Status: Abnormal   Collection Time: 01/26/21  5:14 AM   Specimen: Urine, Clean Catch  Result Value Ref Range Status   Specimen Description   Final    URINE, CLEAN CATCH Performed at [facility], [address], [place], [place] [address]    Special Requests   Final     NONE Performed at [facility], [place], [place] [address]    Culture >=100,000 COLONIES/mL ENTEROBACTER CLOACAE (A)  Final   Report Status 01/28/2021 FINAL  Final   Organism ID, Bacteria ENTEROBACTER CLOACAE (A)  Final      Susceptibility   Enterobacter cloacae - MIC*    CEFAZOLIN >=64 RESISTANT Resistant     CEFEPIME <=0.12 SENSITIVE Sensitive     CIPROFLOXACIN 0.5 SENSITIVE Sensitive     GENTAMICIN >=16 RESISTANT Resistant     IMIPENEM <=0.25 SENSITIVE Sensitive     NITROFURANTOIN 64 INTERMEDIATE Intermediate     TRIMETH/SULFA >=320 RESISTANT Resistant     PIP/TAZO <=4 SENSITIVE Sensitive     * >=100,000 COLONIES/mL ENTEROBACTER CLOACAE  Resp Panel by RT-PCR (Flu A&B, Covid) Nasopharyngeal Swab     Status: None   Collection Time: 01/26/21  9:14 AM   Specimen: Nasopharyngeal Swab; Nasopharyngeal(NP) swabs in vial transport medium  Result Value Ref Range Status   SARS Coronavirus 2 by RT PCR NEGATIVE NEGATIVE Final    Comment: (NOTE) SARS-CoV-2 target nucleic acids are NOT DETECTED.  The SARS-CoV-2 RNA is generally detectable in upper respiratory specimens during the acute phase of infection. The lowest concentration of SARS-CoV-2 viral copies this assay can detect is 138 copies/mL. A negative result does not preclude SARS-Cov-2 infection and should not be used as the sole basis for treatment or other patient management decisions. A negative result may occur with  improper specimen collection/handling, submission of specimen other than nasopharyngeal swab, presence of viral mutation(s) within the areas targeted by this assay, and inadequate number of viral copies(<138 copies/mL). A negative result must be combined with clinical observations, patient history, and epidemiological information. The expected result is Negative.  Fact Sheet for Patients:  EntrepreneurPulse.com.au  Fact Sheet for Healthcare Providers:   IncredibleEmployment.be  This test is no t yet approved or cleared by the [place] FDA and  has been authorized for detection and/or diagnosis of SARS-CoV-2 by FDA under an Emergency Use Authorization (EUA). This EUA will remain  in effect (meaning this test can be used) for the duration of the COVID-19 declaration under Section 564(b)(1) of the Act, 21 U.S.C.section 360bbb-3(b)(1), unless the authorization is terminated  or revoked sooner.       Influenza A by PCR NEGATIVE NEGATIVE  Final   Influenza B by PCR NEGATIVE NEGATIVE Final    Comment: (NOTE) The Xpert Xpress SARS-CoV-2/FLU/RSV plus assay is intended as an aid in the diagnosis of influenza from Nasopharyngeal swab specimens and should not be used as a sole basis for treatment. Nasal washings and aspirates are unacceptable for Xpert Xpress SARS-CoV-2/FLU/RSV testing.  Fact Sheet for Patients: EntrepreneurPulse.com.au  Fact Sheet for Healthcare Providers: IncredibleEmployment.be  This test is not yet approved or cleared by the [place] FDA and has been authorized for detection and/or diagnosis of SARS-CoV-2 by FDA under an Emergency Use Authorization (EUA). This EUA will remain in effect (meaning this test can be used) for the duration of the COVID-19 declaration under Section 564(b)(1) of the Act, 21 U.S.C. section 360bbb-3(b)(1), unless the authorization is terminated or revoked.  Performed at [facility], [address] [place], [place] [address]   Culture, blood (x 2)     Status: None (Preliminary result)   Collection Time: 01/26/21  9:15 AM   Specimen: BLOOD  Result Value Ref Range Status   Specimen Description BLOOD  RAC  Final   Special Requests   Final    BOTTLES DRAWN AEROBIC AND ANAEROBIC Blood Culture results may not be optimal due to an inadequate volume of blood received in culture bottles   Culture   Final    NO GROWTH 2  DAYS Performed at [name], [address] [place], [place] [address]    Report Status PENDING  Incomplete  Culture, blood (x 2)     Status: None (Preliminary result)   Collection Time: 01/26/21  2:58 PM   Specimen: BLOOD  Result Value Ref Range Status   Specimen Description BLOOD BLOOD LEFT HAND  Final   Special Requests   Final    BOTTLES DRAWN AEROBIC AND ANAEROBIC Blood Culture adequate volume   Culture   Final    NO GROWTH 2 DAYS Performed at [facility], [address], [place], [place] [address]    Report Status PENDING  Incomplete  MRSA Next Gen by PCR, Nasal     Status: Abnormal   Collection Time: 01/26/21  9:25 PM   Specimen: Nasal Mucosa; Nasal Swab  Result Value Ref Range Status   MRSA by PCR Next Gen DETECTED (A) NOT DETECTED Final    Comment: RESULT CALLED TO, READ BACK BY AND VERIFIED WITH: Trina Ao RN [phone] 01/26/21 HNM Performed at [facility], [place], [name] [address]   C Difficile Quick Screen w PCR reflex     Status: None   Collection Time: 01/27/21  6:35 AM   Specimen: STOOL  Result Value Ref Range Status   C Diff antigen NEGATIVE NEGATIVE Final   C Diff toxin NEGATIVE NEGATIVE Final   C Diff interpretation No C. difficile detected.  Final    Comment: Performed at [facility], [address] [place], [place] [address]  Gastrointestinal Panel by PCR , Stool     Status: None   Collection Time: 01/27/21  6:35 AM   Specimen: STOOL  Result Value Ref Range Status   Campylobacter species NOT DETECTED NOT DETECTED Final   Plesimonas shigelloides NOT DETECTED NOT DETECTED Final   Salmonella species NOT DETECTED NOT DETECTED Final   Yersinia enterocolitica NOT DETECTED NOT DETECTED Final   Vibrio species NOT DETECTED NOT DETECTED Final   Vibrio cholerae NOT DETECTED NOT DETECTED Final   Enteroaggregative E coli (EAEC) NOT DETECTED NOT DETECTED  Final   Enteropathogenic E coli (EPEC) NOT  DETECTED NOT DETECTED Final   Enterotoxigenic E coli (ETEC) NOT DETECTED NOT DETECTED Final   Shiga like toxin producing E coli (STEC) NOT DETECTED NOT DETECTED Final   Shigella/Enteroinvasive E coli (EIEC) NOT DETECTED NOT DETECTED Final   Cryptosporidium NOT DETECTED NOT DETECTED Final   Cyclospora cayetanensis NOT DETECTED NOT DETECTED Final   Entamoeba histolytica NOT DETECTED NOT DETECTED Final   Giardia lamblia NOT DETECTED NOT DETECTED Final   Adenovirus F40/41 NOT DETECTED NOT DETECTED Final   Astrovirus NOT DETECTED NOT DETECTED Final   Norovirus GI/GII NOT DETECTED NOT DETECTED Final   Rotavirus A NOT DETECTED NOT DETECTED Final   Sapovirus (I, II, IV, and V) NOT DETECTED NOT DETECTED Final    Comment: Performed at [facility], [address] [place], [place] [address]         Radiology Studies: CT ABDOMEN PELVIS WO CONTRAST  Result Date: 01/26/2021 CLINICAL DATA:  Abdominal pain. EXAM: CT ABDOMEN AND PELVIS WITHOUT CONTRAST TECHNIQUE: Multidetector CT imaging of the abdomen and pelvis was performed following the standard protocol without IV contrast. COMPARISON:  August 28, 2020 FINDINGS: Lower chest: Mild atelectasis is seen within the bilateral lung bases. Hepatobiliary: No focal liver abnormality is seen. Tiny, ill-defined gallstones are seen within the lumen of a moderately distended gallbladder. There is no evidence of gallbladder wall thickening or pericholecystic inflammation. Pancreas: Unremarkable. No pancreatic ductal dilatation or surrounding inflammatory changes. Spleen: Normal in size without focal abnormality. Adrenals/Urinary Tract: Adrenal glands are unremarkable. Kidneys are normal in size, without focal lesions. Prominent bilateral extrarenal pelvis are seen without evidence of renal calculi. Moderate to marked severity right-sided perinephric inflammatory fat stranding is noted. This represents a new finding when compared to the prior study. Bladder is  unremarkable. Stomach/Bowel: There is a small hiatal hernia. Surgical sutures are seen within the gastric region. Surgically anastomosed bowel is also seen within the anterior aspect of the mid to upper left abdomen. The appendix is surgically absent. No evidence of bowel wall thickening, distention, or inflammatory changes. Vascular/Lymphatic: No significant vascular findings are present. No enlarged abdominal or pelvic lymph nodes. Reproductive: Uterus and bilateral adnexa are unremarkable. Other: No abdominal wall hernia or abnormality. No abdominopelvic ascites. Musculoskeletal: Multilevel degenerative changes seen throughout the lumbar spine. IMPRESSION: 1. Findings concerning for the presence of acute pyelonephritis involving the right kidney. Correlation with urinalysis is recommended. 2. Cholelithiasis without evidence of acute cholecystitis. 3. Small hiatal hernia. 4. Evidence of prior gastric and small bowel surgery. Electronically Signed   By: Virgina Norfolk M.D.   On: 01/26/2021 21:11        Scheduled Meds:  Chlorhexidine Gluconate Cloth  6 each Topical Q0600   enoxaparin (LOVENOX) injection  40 mg Subcutaneous Q24H   escitalopram  20 mg Oral Daily   famotidine  20 mg Oral BID   feeding supplement  237 mL Oral TID BM   ferrous sulfate  325 mg Oral Q M,W,F   folic acid  1 mg Oral Daily   gabapentin  800 mg Oral BID   hydrocortisone sodium succinate  75 mg Intravenous Q12H   levothyroxine  75 mcg Oral QAC breakfast   loperamide  2 mg Oral Q1200   midodrine  5 mg Oral TID WC   multivitamin with minerals  1 tablet Oral Daily   mupirocin ointment  1 application Nasal BID   OLANZapine  7.5 mg Oral QHS   traZODone  125 mg Oral QHS   Continuous Infusions:  sodium chloride Stopped (01/27/21 1007)   ceFEPime (MAXIPIME) IV     lactated ringers 75 mL/hr at 01/28/21 0826     LOS: 2 days    Time spent: 25 minutes    Sidney Ace, MD [facility] Pager [phone]  If 7PM-7AM, please contact night-coverage 01/28/2021, 11:49 AM

## 2021-01-28 NOTE — Progress Notes (Signed)
Patient transferred to 2A by ICU Valetta Fuller RN and Gerald Stabs NT without distress. Patient vitals WDL at time of arrival, oriented to unit, provided with additional blankets, connected to [place]. All personal items within reach at this time. Patient is resting in bed at this time with all needs met. Will continue to monitor closely.  Interpreter at bedside at this time.

## 2021-01-28 NOTE — Evaluation (Signed)
Occupational Therapy Evaluation Patient Details Name: Shelly Freeman MRN: [id number] DOB: Jan 19, 1951 Today's Date: 01/28/2021   History of Present Illness Pt is a 70 y/o F admitted on 01/25/21 after presenting with c/o fall & AMS. CT imaging significant for R sided pyelonephritis. Pt being treated for severe sepsis with septic shock 2/2 UTI & R sided pyelonephritis. PMH: HTN, COPD, hypothyroidism, GERD, lupus, kideny stone, chronic back pain, OSA not on CPAP, dementia, L TKA 10/25/20, systemic lupus   Clinical Impression   Pt seen for OT Evaluation this date in setting of acute hospitalization d/t fall. Pt reports being MOD I for fxl mobility at baseline and able to perform her basic self care. States she requires her dtr's assistance to perform IADLs such as meal prep. States that her daughter lives within a 5 minute drive. Pt present this date with decreased activity tolerance and general weakness superimposed on chronic pain (mostly in B LE, L worse than R). Pt requires MIN A for bed mobility, ADL transfers, and fxl mobility (RW for transfers and amb for UE Support). OT engages pt in toileting tasks with MOD A d/t limited ROM, weakness and pain. Pt returned to bed at end of session with all needs met and in reach. Will continue to follow. Anticipate pt will require STR d/t decreased INDEP With ADLs/ADL mobility.      Recommendations for follow up therapy are one component of a multi-disciplinary discharge planning process, led by the attending physician.  Recommendations may be updated based on patient status, additional functional criteria and insurance authorization.   Follow Up Recommendations  SNF    Equipment Recommendations  3 in 1 bedside commode;Tub/shower seat    Recommendations for Other Services       Precautions / Restrictions Precautions Precautions: Fall Restrictions Weight Bearing Restrictions: No      Mobility Bed Mobility Overal bed mobility: Needs Assistance Bed  Mobility: Sit to Supine     Supine to sit: Min assist;HOB elevated Sit to supine: Min assist   General bed mobility comments: MIN A to assist with LEs back to bed    Transfers Overall transfer level: Needs assistance Equipment used: Rolling walker (2 wheeled) Transfers: Sit to/from Stand Sit to Stand: Min guard;Min assist         General transfer comment: increased time    Balance Overall balance assessment: Needs assistance Sitting-balance support: Bilateral upper extremity supported;Feet supported Sitting balance-Leahy Scale: Good     Standing balance support: Bilateral upper extremity supported;During functional activity Standing balance-Leahy Scale: Fair Standing balance comment: BUE support on RW                           ADL either performed or assessed with clinical judgement   ADL Overall ADL's : Needs assistance/impaired                                       General ADL Comments: requires SETUP for seated UB ADLS, MOD/MAX A for seated LB ADLs, MIN A for ADL transfers and MIN A for fxl mobility with RW.     Vision Patient Visual Report: No change from baseline       Perception     Praxis      Pertinent Vitals/Pain Pain Assessment: Faces Faces Pain Scale: Hurts even more Pain Location: L knee Pain Descriptors / Indicators: Discomfort Pain  Intervention(s): Limited activity within patient's tolerance;Monitored during session;Patient requesting pain meds-RN notified;Repositioned     Hand Dominance Right   Extremity/Trunk Assessment Upper Extremity Assessment Upper Extremity Assessment: Generalized weakness   Lower Extremity Assessment Lower Extremity Assessment: Generalized weakness (limited L LE ROM for LB ADLs 2/2 h/o TKR and chronic pain)       Communication Communication Communication: Interpreter utilized (ipad interpreter, ID #[id number] Anne Hahn)   Cognition Arousal/Alertness: Awake/alert Behavior During Therapy:  WFL for tasks assessed/performed Overall Cognitive Status: Within Functional Limits for tasks assessed                                     General Comments  HR up to 86 bpm with activity    Exercises Other Exercises Other Exercises: OT ed with pt re: importance of OOB Activity, importance of improving INDEP with ADLs/IADLs in order to safely live at home. Pt with moderate reception. Somewhat confused.   Shoulder Instructions      Home Living Family/patient expects to be discharged to:: Private residence Living Arrangements: Alone Available Help at Discharge: Other (Comment) (none) Type of Home: Apartment Home Access: Level entry     Home Layout: One level     Bathroom Shower/Tub: Tub/shower unit         Home Equipment: Walker - 2 wheels          Prior Functioning/Environment Level of Independence: Independent with assistive device(s)        Comments: Pt reports she ambulates with RW, cooks & bathes/dresses herself but also endorses living a sedentary life. Pt endorses 2 falls prior to admission.        OT Problem List: Decreased strength;Decreased range of motion;Decreased activity tolerance;Impaired balance (sitting and/or standing)      OT Treatment/Interventions: Self-care/ADL training;Therapeutic exercise;DME and/or AE instruction;Patient/family education;Balance training;Therapeutic activities    OT Goals(Current goals can be found in the care plan section) Acute Rehab OT Goals Patient Stated Goal: decreased pain OT Goal Formulation: With patient Time For Goal Achievement: 02/11/21 Potential to Achieve Goals: Fair ADL Goals Pt Will Perform Lower Body Dressing: with min assist;with adaptive equipment;sit to/from stand Pt Will Transfer to Toilet: with supervision;ambulating (with LRAD to/from restroom) Pt Will Perform Toileting - Clothing Manipulation and hygiene: with min guard assist;with min assist;sit to/from stand  OT Frequency: Min  1X/week   Barriers to D/C: Decreased caregiver support          Co-evaluation              AM-PAC OT "6 Clicks" Daily Activity     Outcome Measure Help from another person eating meals?: None Help from another person taking care of personal grooming?: A Little Help from another person toileting, which includes using toliet, bedpan, or urinal?: A Lot Help from another person bathing (including washing, rinsing, drying)?: A Lot Help from another person to put on and taking off regular upper body clothing?: A Little Help from another person to put on and taking off regular lower body clothing?: A Lot 6 Click Score: 16   End of Session Equipment Utilized During Treatment: Gait belt;Rolling walker Nurse Communication: Mobility status  Activity Tolerance: Patient tolerated treatment well Patient left: in bed;with call bell/phone within reach;with bed alarm set  OT Visit Diagnosis: Unsteadiness on feet (R26.81);Other abnormalities of gait and mobility (R26.89);Pain Pain - Right/Left: Left Pain - part of body: Knee  Time: 2081-3887 OT Time Calculation (min): 38 min Charges:  OT General Charges $OT Visit: 1 Visit OT Evaluation $OT Eval Moderate Complexity: 1 Mod OT Treatments $Self Care/Home Management : 8-22 mins $Therapeutic Activity: 8-22 mins  Gerrianne Scale, MS, OTR/L ascom [phone] 01/28/21, 5:21 PM

## 2021-01-28 NOTE — Plan of Care (Signed)
  Problem: Education: Goal: Knowledge of General Education information will improve Description: Including pain rating scale, medication(s)/side effects and non-pharmacologic comfort measures Outcome: Progressing   Problem: Pain Managment: Goal: General experience of comfort will improve Outcome: Progressing   Problem: Safety: Goal: Ability to remain free from injury will improve Outcome: Progressing   

## 2021-01-28 NOTE — TOC Initial Note (Signed)
Transition of Care [facility]) - Initial/Assessment Note    Patient Details  Name: Shelly Freeman MRN: [id number] Date of Birth: 20-Oct-1950  Transition of Care [facility]) CM/SW Contact:    Magnus Ivan, LCSW Phone Number: 01/28/2021, 12:20 PM  Clinical Narrative:     CSW spoke with patient at bedside using Interpreter # [phone] for high risk screening. Patient lives alone and [facility]. [facility] provides medications, PCP care, and transportation to appointments. Patient uses a RW at home. Went to [facility] in June for short-term rehab. Patient denies needs at this time. TOC to follow.              Expected Discharge Plan: Home/Self Care Barriers to Discharge: Continued Medical Work up   Patient Goals and CMS Choice Patient states their goals for this hospitalization and ongoing recovery are:: to return home with [facility] services CMS Medicare.gov Compare Post Acute Care list provided to:: Patient Choice offered to / list presented to : Patient  Expected Discharge Plan and Services Expected Discharge Plan: Home/Self Care       Living arrangements for the past 2 months: Single Family Home                                      Prior Living Arrangements/Services Living arrangements for the past 2 months: Single Family Home Lives with:: Self Patient language and need for interpreter reviewed:: Yes Do you feel safe going back to the place where you live?: Yes          Current home services: DME Criminal Activity/Legal Involvement Pertinent to Current Situation/Hospitalization: No - Comment as needed  Activities of Daily Living Home Assistive Devices/Equipment: Walker (specify type) (4 legs) ADL Screening (condition at time of admission) Patient's cognitive ability adequate to safely complete daily activities?: Yes Is the patient deaf or have difficulty hearing?: No Does the patient have difficulty seeing, even when wearing glasses/contacts?: No Does  the patient have difficulty concentrating, remembering, or making decisions?: Yes Patient able to express need for assistance with ADLs?: Yes Does the patient have difficulty dressing or bathing?: Yes Independently performs ADLs?: No Communication: Independent (Spanish speaking) Dressing (OT): Needs assistance Is this a change from baseline?: Change from baseline, expected to last <3days Grooming: Needs assistance Is this a change from baseline?: Change from baseline, expected to last <3 days Feeding: Independent Bathing: Needs assistance Is this a change from baseline?: Change from baseline, expected to last <3 days Toileting: Needs assistance Is this a change from baseline?: Change from baseline, expected to last <3 days In/Out Bed: Needs assistance Is this a change from baseline?: Change from baseline, expected to last <3 days Walks in Home: Independent with device (comment) (4 leg walker) Does the patient have difficulty walking or climbing stairs?: Yes Weakness of Legs: Left Weakness of Arms/Hands: None  Permission Sought/Granted Permission sought to share information with : Chartered certified accountant granted to share information with : Yes, Verbal Permission Granted     Permission granted to share info w AGENCY: [facility] Representatives        Emotional Assessment       Orientation: : Oriented to Self, Oriented to Place, Oriented to  Time, Oriented to Situation Alcohol / Substance Use: Not Applicable Psych Involvement: No (comment)  Admission diagnosis:  UTI (urinary tract infection) [N39.0] Primary osteoarthritis of left knee [M17.12] Severe sepsis with  septic shock ([place]) [A41.9, R65.21] Sepsis due to gram-negative UTI ([place]) [A41.50, N39.0] KPVVZ-48 virus infection [U07.1] Patient Active Problem List   Diagnosis Date Noted   Sepsis due to gram-negative UTI ([place]) 01/26/2021   HTN (hypertension) 01/26/2021   Depression with anxiety 01/26/2021   Severe  sepsis with septic shock ([place]) 27/11/8673   Acute metabolic encephalopathy 44/92/0100   COVID-19 virus infection 01/26/2021   S/P TKR (total knee replacement) using cement, 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   Bilateral pneumonia 08/29/2020   AKI (acute kidney injury) ([place]) 08/29/2020   COPD (chronic obstructive pulmonary disease) ([place]) 08/29/2020   Fall at home, initial encounter 08/29/2020   CAP (community acquired pneumonia) 08/29/2020   Obesity, Class III, BMI 40-49.9 (morbid obesity) ([place]) 05/23/2019   UTI (urinary tract infection) 05/23/2019   Right rib fracture 05/23/2019   SIRS (systemic inflammatory response syndrome) ([place]) 05/23/2019   Bacteremia due to Gram-negative bacteria 05/23/2019   Acute bronchiolitis 01/28/2019   Left anterior knee pain 10/24/2016   Right upper quadrant pain 10/24/2016   Urination pain 06/26/2016   DDD (degenerative disc disease), lumbar 05/14/2016   Lipoma of skin 05/14/2016   Vitamin D deficiency, unspecified 05/14/2016   Major neurocognitive disorder, due to vascular disease, with behavioral disturbance, mild ([place]) 04/15/2016   Severe recurrent major depression with psychotic features ([place]) 04/04/2016   Lupus ([place]) 04/04/2016   GERD (gastroesophageal reflux disease) 04/04/2016   COPD with acute bronchitis ([place]) 04/04/2016   Acute respiratory failure with hypoxia ([place]) 04/03/2016   Encephalopathy, metabolic 71/21/9758   Hypokalemia 04/03/2016   Pain in limb 04/03/2016   Hypothyroidism 04/03/2016   Essential hypertension 04/03/2016   Intentional opiate overdose ([place]) 03/30/2016   Age related osteoporosis 03/19/2016   Chronic midline low back pain without sciatica 03/19/2016   Encounter for long-term (current) use of high-risk medication 03/19/2016   Primary osteoarthritis of both knees 03/19/2016   PCP:  Janna Arch, MD Pharmacy:   [id number] - [place], [place]  [address] Phone: [phone] Fax:  [phone]  [id number] - [place], [name] - [address] [name] [address] Phone: [phone] Fax: [place] [address] [place], [name] [address] [name] [address] Phone: [phone] Fax: [place], [name] - [place] [address] [place] [address] Phone: [phone] Fax: [phone]     Social Determinants of Health (SDOH) Interventions    Readmission Risk Interventions Readmission Risk Prevention Plan 01/28/2021 01/30/2019  Post Dischage Appt - Complete  Medication Screening - Complete  Transportation Screening Complete Complete  PCP or Specialist Appt within 3-5 Days Complete -  HRI or Home Care Consult Complete -  Social Work Consult for Recovery Care Planning/Counseling Complete -  Palliative Care Screening Not Applicable -  Medication Review (RN Care Manager) Complete -  Some recent data might be hidden

## 2021-01-29 LAB — BASIC METABOLIC PANEL
Anion gap: 9 (ref 5–15)
BUN: 18 mg/dL (ref 8–23)
CO2: 25 mmol/L (ref 22–32)
Calcium: 8.5 mg/dL — ABNORMAL LOW (ref 8.9–10.3)
Chloride: 109 mmol/L (ref 98–111)
Creatinine, Ser: 0.6 mg/dL (ref 0.44–1.00)
GFR, Estimated: >60 mL/min (ref >60)
Glucose, Bld: 114 mg/dL — ABNORMAL HIGH (ref 70–99)
Potassium: 4.4 mmol/L (ref 3.5–5.1)
Sodium: 143 mmol/L (ref 135–145)

## 2021-01-29 LAB — CBC WITH DIFFERENTIAL/PLATELET
Abs Immature Granulocytes: 0.11 10*3/uL — ABNORMAL HIGH (ref 0.00–0.07)
Basophils Absolute: 0 10*3/uL (ref 0.0–0.1)
Basophils Relative: 0 %
Eosinophils Absolute: 0 10*3/uL (ref 0.0–0.5)
Eosinophils Relative: 0 %
HCT: 40.1 % (ref 36.0–46.0)
Hemoglobin: 12.4 g/dL (ref 12.0–15.0)
Immature Granulocytes: 1 %
Lymphocytes Relative: 9 %
Lymphs Abs: 1.4 10*3/uL (ref 0.7–4.0)
MCH: 29.2 pg (ref 26.0–34.0)
MCHC: 30.9 g/dL (ref 30.0–36.0)
MCV: 94.6 fL (ref 80.0–100.0)
Monocytes Absolute: 0.6 10*3/uL (ref 0.1–1.0)
Monocytes Relative: 4 %
Neutro Abs: 12.4 10*3/uL — ABNORMAL HIGH (ref 1.7–7.7)
Neutrophils Relative %: 86 %
Platelets: 119 10*3/uL — ABNORMAL LOW (ref 150–400)
RBC: 4.24 MIL/uL (ref 3.87–5.11)
RDW: 16.6 % — ABNORMAL HIGH (ref 11.5–15.5)
WBC: 14.6 10*3/uL — ABNORMAL HIGH (ref 4.0–10.5)
nRBC: 0 % (ref 0.0–0.2)

## 2021-01-29 LAB — GLUCOSE, CAPILLARY
Glucose-Capillary: 118 mg/dL — ABNORMAL HIGH (ref 70–99)
Glucose-Capillary: 119 mg/dL — ABNORMAL HIGH (ref 70–99)
Glucose-Capillary: 202 mg/dL — ABNORMAL HIGH (ref 70–99)

## 2021-01-29 NOTE — Progress Notes (Signed)
PROGRESS NOTE    Shelly Freeman  JQZ:[id number] DOB: [birth year] DOA: 01/25/2021 PCP: Janna Arch, MD   Brief Narrative:    70 y.o. female with medical history significant of hypertension, COPD, hypothyroidism, GERD, lupus, kidney stone, chronic back pain, OSA not on CPAP, positive blood culture with E. 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   Patient speaks Spanish.  History is obtained with help of iPad interpreter.  I also spoke with her granddaughter and her PCP on the phone.  Per report, patient fell in the early morning when she was going to the bathroom. She was laying on the right side for long time.  After fall, she complains of pain in the left hip. Actually, pt complains of pain all over, including both legs, both shoulders, both hands and left hip when I saw pt in ED. Patient is drowsy, mildly confused, but still oriented x3.  Patient moves all extremities.  No facial droop or slurred speech.  Patient states that she has mild shortness of breath, mild dry cough, no chest pain.  No fever or chills.  Patient reports dysuria, burning on urination, and increased urinary frequency.    Patient reports diarrhea.  No nausea, vomiting or abdominal pain. Of note, Dr. Coralie Common from [facility] called me and provided  some important information, which is highly appreciated.  Per Dr. Ovid Curd, patient has been having diarrhea for several weeks.  They are currently doing work-up as outpatient for diarrhea.  Patient had negative C. difficile test 2 weeks ago.  Stool culture is pending.  Patient finished a course of Flagyl treatment empirically.  Her diarrhea has slightly improved.  Patient was admitted to stepdown status due to persistent hypotension.  Started on midodrine and stress dose steroids.  Also started on peripheral Levophed.  On the morning of 9/23 Levophed turned off as of 5 AM.  Patient's blood pressure  remained stable on just midodrine and IV hydrocortisone.  CT imaging survey significant for right-sided pyelonephritis.  Patient's blood pressure stabilized and she was transferred of ICU service on 9/23. Urine culture with Enterobacter.  Sensitive to cefepime.  Patient's main complaint is pain in bilateral feet and neck pain.  Assessment & Plan:   Principal Problem:   Severe sepsis with septic shock (HCC) Active Problems:   Hypothyroidism   GERD (gastroesophageal reflux disease)   UTI (urinary tract infection)   COPD (chronic obstructive pulmonary disease) ([place])   Fall at home, initial encounter   HTN (hypertension)   Depression with anxiety   Acute metabolic encephalopathy  Severe sepsis with septic shock due to UTI and right-sided pyelonephritis  Initially patient had blood pressure 113/56, later on developed hypotension which is resistant to IV fluid resuscitation.   After giving 4L of normal saline bolus, patient's blood pressure still in 75/59.   Lactic acid is elevated 1.3, 3.6.   Patient also has leukocytosis with WBC 17.1, tachycardia with heart rate in 96.   Admitted to stepdown, peripheral Levophed started ICU consulted Levophed weaned off as of 5 AM on 9/23 Transferred to PCU 9/23 Shock physiology resolved  Plan: Continue IV cefepime Can transition to p.o. at time of discharge Plan for 7 to 10-day antibiotic course DC IV fluids DC stress dose steroids DC midodrine Follow cultures Monitor vitals and fever curve   Hypothyroidism -Synthroid   GERD (gastroesophageal reflux disease) -Pepcid   COPD (chronic obstructive pulmonary disease) ([place]) -  Bronchodilators   Fall at home, initial encounter:  -Therapy ordered -Recommendation for skilled nursing facility -TOC to reach out to [facility] to notify   HTN (hypertension) -Home blood pressure medications on hold for now -Monitor BP and restart as appropriate   Depression with anxiety -Continue home  medications   Acute metabolic encephalopathy -Frequent neuro check     Diarrhea Chronic diarrhea per outpatient PCP Negative C. difficile test 2 weeks ago and then again here Formed stool as of 923 Plan: DC isolation As needed Imodium  DVT prophylaxis: SQ Lovenox Code Status: Partial Family Communication: Granddaughter [phone] on 9/23 Disposition Plan: Status is: Inpatient  Remains inpatient appropriate because:Inpatient level of care appropriate due to severity of illness  Dispo: The patient is from: Home              Anticipated d/c is to: Home              Patient currently is not medically stable to d/c.   Difficult to place patient No      . Level of care: Progressive Cardiac  Consultants:  ICU  Procedures:  None  Antimicrobials:  Ceftriaxone   Subjective: Patient seen and examined.  Working with physical therapy.  Visibly no distress.  Continues to endorse pain in bilateral feet.  Objective: Vitals:   01/28/21 1611 01/28/21 2022 01/29/21 0642 01/29/21 0758  BP: (!) 156/78 (!) 168/86 (!) 157/77 (!) 168/85  Pulse: (!) 55 (!) 56 (!) 56 (!) 51  Resp: 17 16 17 18   Temp: 97.6 F (36.4 C) 98.3 F (36.8 C) 97.9 F (36.6 C) 97.7 F (36.5 C)  TempSrc:   Oral Oral  SpO2: 93% 96% 95% 95%  Weight:      Height:        Intake/Output Summary (Last 24 hours) at 01/29/2021 1151 Last data filed at 01/29/2021 0950 Gross per 24 hour  Intake 1573.57 ml  Output 400 ml  Net 1173.57 ml   Filed Weights   01/26/21 2110  Weight: 109.6 kg    Examination:  General exam: No acute distress.  Walking with physical therapy Respiratory system: Lungs clear.  Normal work of breathing.  Room air Cardiovascular system: S1-S2, RRR, no murmurs, no pedal edema Gastrointestinal system: Soft, nondistended, mild TTP suprapubic region.  Positive bowel sounds Central nervous system: Alert, oriented x2, no focal deficits Extremities: Symmetric 5 x 5 power. Skin: No rashes,  lesions or ulcers Psychiatry: Judgement and insight appear normal. Mood & affect flattened     Data Reviewed: I have personally reviewed following labs and imaging studies  CBC: Recent Labs  Lab 01/25/21 2234 01/27/21 0641 01/29/21 0810  WBC 17.1* 22.5* 14.6*  NEUTROABS  --   --  12.4*  HGB 14.2 12.6 12.4  HCT 44.8 38.8 40.1  MCV 93.5 91.9 94.6  PLT 157 124* 937*   Basic Metabolic Panel: Recent Labs  Lab 01/25/21 2234 01/27/21 0641 01/29/21 0810  NA 141 138 143  K 4.5 3.8 4.4  CL 106 108 109  CO2 27 20* 25  GLUCOSE 168* 149* 114*  BUN 12 20 18   CREATININE 0.79 0.70 0.60  CALCIUM 8.4* 7.2* 8.5*   GFR: Estimated Creatinine Clearance: 77 mL/min (by C-G formula based on SCr of 0.6 mg/dL). Liver Function Tests: Recent Labs  Lab 01/25/21 2234  AST 21  ALT 12  ALKPHOS 77  BILITOT 0.8  PROT 6.5  ALBUMIN 3.3*   No results for input(s): LIPASE, AMYLASE in  the last 168 hours. No results for input(s): AMMONIA in the last 168 hours. Coagulation Profile: No results for input(s): INR, PROTIME in the last 168 hours. Cardiac Enzymes: Recent Labs  Lab 01/25/21 2234  CKTOTAL 146   BNP (last 3 results) No results for input(s): PROBNP in the last 8760 hours. HbA1C: No results for input(s): HGBA1C in the last 72 hours. CBG: Recent Labs  Lab 01/27/21 0851 01/27/21 1128 01/27/21 1637 01/28/21 1935 01/29/21 0759  GLUCAP 124* 149* 210* 158* 118*   Lipid Profile: No results for input(s): CHOL, HDL, LDLCALC, TRIG, CHOLHDL, LDLDIRECT in the last 72 hours. Thyroid Function Tests: Recent Labs    01/27/21 0641  TSH 1.092   Anemia Panel: No results for input(s): VITAMINB12, FOLATE, FERRITIN, TIBC, IRON, RETICCTPCT in the last 72 hours. Sepsis Labs: Recent Labs  Lab 01/26/21 0915 01/26/21 0919 01/26/21 1458 01/26/21 1704 01/26/21 1918 01/27/21 0641 01/28/21 0415  PROCALCITON 4.37  --   --   --   --  18.11 12.88  LATICACIDVEN  --  1.3 3.6* 1.5 1.5  --   --      Recent Results (from the past 240 hour(s))  Urine Culture     Status: Abnormal   Collection Time: 01/26/21  5:14 AM   Specimen: Urine, Clean Catch  Result Value Ref Range Status   Specimen Description   Final    URINE, CLEAN CATCH Performed at [facility], [address], [place], [place] [address]    Special Requests   Final    NONE Performed at [facility], [place], [place] [address]    Culture >=100,000 COLONIES/mL ENTEROBACTER CLOACAE (A)  Final   Report Status 01/28/2021 FINAL  Final   Organism ID, Bacteria ENTEROBACTER CLOACAE (A)  Final      Susceptibility   Enterobacter cloacae - MIC*    CEFAZOLIN >=64 RESISTANT Resistant     CEFEPIME <=0.12 SENSITIVE Sensitive     CIPROFLOXACIN 0.5 SENSITIVE Sensitive     GENTAMICIN >=16 RESISTANT Resistant     IMIPENEM <=0.25 SENSITIVE Sensitive     NITROFURANTOIN 64 INTERMEDIATE Intermediate     TRIMETH/SULFA >=320 RESISTANT Resistant     PIP/TAZO <=4 SENSITIVE Sensitive     * >=100,000 COLONIES/mL ENTEROBACTER CLOACAE  Resp Panel by RT-PCR (Flu A&B, Covid) Nasopharyngeal Swab     Status: None   Collection Time: 01/26/21  9:14 AM   Specimen: Nasopharyngeal Swab; Nasopharyngeal(NP) swabs in vial transport medium  Result Value Ref Range Status   SARS Coronavirus 2 by RT PCR NEGATIVE NEGATIVE Final    Comment: (NOTE) SARS-CoV-2 target nucleic acids are NOT DETECTED.  The SARS-CoV-2 RNA is generally detectable in upper respiratory specimens during the acute phase of infection. The lowest concentration of SARS-CoV-2 viral copies this assay can detect is 138 copies/mL. A negative result does not preclude SARS-Cov-2 infection and should not be used as the sole basis for treatment or other patient management decisions. A negative result may occur with  improper specimen collection/handling, submission of specimen other than nasopharyngeal swab, presence of viral mutation(s) within the areas  targeted by this assay, and inadequate number of viral copies(<138 copies/mL). A negative result must be combined with clinical observations, patient history, and epidemiological information. The expected result is Negative.  Fact Sheet for Patients:  EntrepreneurPulse.com.au  Fact Sheet for Healthcare Providers:  IncredibleEmployment.be  This test is no t yet approved or cleared by the [place] FDA and  has been  authorized for detection and/or diagnosis of SARS-CoV-2 by FDA under an Emergency Use Authorization (EUA). This EUA will remain  in effect (meaning this test can be used) for the duration of the COVID-19 declaration under Section 564(b)(1) of the Act, 21 U.S.C.section 360bbb-3(b)(1), unless the authorization is terminated  or revoked sooner.       Influenza A by PCR NEGATIVE NEGATIVE Final   Influenza B by PCR NEGATIVE NEGATIVE Final    Comment: (NOTE) The Xpert Xpress SARS-CoV-2/FLU/RSV plus assay is intended as an aid in the diagnosis of influenza from Nasopharyngeal swab specimens and should not be used as a sole basis for treatment. Nasal washings and aspirates are unacceptable for Xpert Xpress SARS-CoV-2/FLU/RSV testing.  Fact Sheet for Patients: EntrepreneurPulse.com.au  Fact Sheet for Healthcare Providers: IncredibleEmployment.be  This test is not yet approved or cleared by the [place] FDA and has been authorized for detection and/or diagnosis of SARS-CoV-2 by FDA under an Emergency Use Authorization (EUA). This EUA will remain in effect (meaning this test can be used) for the duration of the COVID-19 declaration under Section 564(b)(1) of the Act, 21 U.S.C. section 360bbb-3(b)(1), unless the authorization is terminated or revoked.  Performed at [facility], [address] [place], [place] [address]   Culture, blood (x 2)     Status: None (Preliminary result)    Collection Time: 01/26/21  9:15 AM   Specimen: BLOOD  Result Value Ref Range Status   Specimen Description BLOOD  RAC  Final   Special Requests   Final    BOTTLES DRAWN AEROBIC AND ANAEROBIC Blood Culture results may not be optimal due to an inadequate volume of blood received in culture bottles   Culture   Final    NO GROWTH 3 DAYS Performed at [facility], [address], [place], [place] [address]    Report Status PENDING  Incomplete  Culture, blood (x 2)     Status: None (Preliminary result)   Collection Time: 01/26/21  2:58 PM   Specimen: BLOOD  Result Value Ref Range Status   Specimen Description BLOOD BLOOD LEFT HAND  Final   Special Requests   Final    BOTTLES DRAWN AEROBIC AND ANAEROBIC Blood Culture adequate volume   Culture   Final    NO GROWTH 3 DAYS Performed at [facility], [address], [place], [place] [address]    Report Status PENDING  Incomplete  MRSA Next Gen by PCR, Nasal     Status: Abnormal   Collection Time: 01/26/21  9:25 PM   Specimen: Nasal Mucosa; Nasal Swab  Result Value Ref Range Status   MRSA by PCR Next Gen DETECTED (A) NOT DETECTED Final    Comment: RESULT CALLED TO, READ BACK BY AND VERIFIED WITH: Trina Ao RN [phone] 01/26/21 HNM Performed at [facility], [place], [name] [address]   C Difficile Quick Screen w PCR reflex     Status: None   Collection Time: 01/27/21  6:35 AM   Specimen: STOOL  Result Value Ref Range Status   C Diff antigen NEGATIVE NEGATIVE Final   C Diff toxin NEGATIVE NEGATIVE Final   C Diff interpretation No C. difficile detected.  Final    Comment: Performed at [facility], [address] [place], [place] [address]  Gastrointestinal Panel by PCR , Stool     Status: None   Collection Time: 01/27/21  6:35 AM   Specimen: STOOL  Result Value Ref Range  Status   Campylobacter species NOT DETECTED NOT DETECTED Final   Plesimonas shigelloides NOT DETECTED NOT  DETECTED Final   Salmonella species NOT DETECTED NOT DETECTED Final   Yersinia enterocolitica NOT DETECTED NOT DETECTED Final   Vibrio species NOT DETECTED NOT DETECTED Final   Vibrio cholerae NOT DETECTED NOT DETECTED Final   Enteroaggregative E coli (EAEC) NOT DETECTED NOT DETECTED Final   Enteropathogenic E coli (EPEC) NOT DETECTED NOT DETECTED Final   Enterotoxigenic E coli (ETEC) NOT DETECTED NOT DETECTED Final   Shiga like toxin producing E coli (STEC) NOT DETECTED NOT DETECTED Final   Shigella/Enteroinvasive E coli (EIEC) NOT DETECTED NOT DETECTED Final   Cryptosporidium NOT DETECTED NOT DETECTED Final   Cyclospora cayetanensis NOT DETECTED NOT DETECTED Final   Entamoeba histolytica NOT DETECTED NOT DETECTED Final   Giardia lamblia NOT DETECTED NOT DETECTED Final   Adenovirus F40/41 NOT DETECTED NOT DETECTED Final   Astrovirus NOT DETECTED NOT DETECTED Final   Norovirus GI/GII NOT DETECTED NOT DETECTED Final   Rotavirus A NOT DETECTED NOT DETECTED Final   Sapovirus (I, II, IV, and V) NOT DETECTED NOT DETECTED Final    Comment: Performed at [facility], [address] [place], [place] [address]         Radiology Studies: No results found.      Scheduled Meds:  Chlorhexidine Gluconate Cloth  6 each Topical Q0600   enoxaparin (LOVENOX) injection  40 mg Subcutaneous Q24H   escitalopram  20 mg Oral Daily   famotidine  20 mg Oral BID   feeding supplement  237 mL Oral TID BM   ferrous sulfate  325 mg Oral Q M,W,F   folic acid  1 mg Oral Daily   gabapentin  800 mg Oral BID   levothyroxine  75 mcg Oral QAC breakfast   loperamide  2 mg Oral Q1200   multivitamin with minerals  1 tablet Oral Daily   mupirocin ointment  1 application Nasal BID   OLANZapine  7.5 mg Oral QHS   traZODone  125 mg Oral QHS   Continuous Infusions:  sodium chloride Stopped (01/27/21 1007)   ceFEPime (MAXIPIME) IV 2 g (01/29/21 0641)   lactated ringers 75 mL/hr at 01/29/21 0025      LOS: 3 days    Time spent: 25 minutes    Sidney Ace, MD [facility] Pager [phone]  If 7PM-7AM, please contact night-coverage 01/29/2021, 11:51 AM

## 2021-01-29 NOTE — NC FL2 (Signed)
[place] LEVEL OF CARE SCREENING TOOL     IDENTIFICATION  Patient Name: Shelly Freeman Birthdate: June 13, 1950 Sex: female Admission Date (Current Location): 01/25/2021  [facility] and [place] Number:  Engineering geologist and Address:  [facility], [address], [place], [place] [address]      Provider Number: [id number]  Attending Physician Name and Address:  Sidney Ace, MD  Relative Name and Phone Number:  Katie, Faraone (Granddaughter)   [phone] [facility])    Current Level of Care: Hospital Recommended Level of Care: [place] Prior Approval Number:    Date Approved/Denied:   PASRR Number: [id number]  Discharge Plan:      Current Diagnoses: Patient Active Problem List   Diagnosis Date Noted   Sepsis due to gram-negative UTI ([place]) 01/26/2021   HTN (hypertension) 01/26/2021   Depression with anxiety 01/26/2021   Severe sepsis with septic shock ([place]) 07/37/1062   Acute metabolic encephalopathy 69/48/5462   COVID-19 virus infection 01/26/2021   S/P TKR (total knee replacement) using cement, 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   Bilateral pneumonia 08/29/2020   AKI (acute kidney injury) ([place]) 08/29/2020   COPD (chronic obstructive pulmonary disease) ([place]) 08/29/2020   Fall at home, initial encounter 08/29/2020   CAP (community acquired pneumonia) 08/29/2020   Obesity, Class III, BMI 40-49.9 (morbid obesity) ([place]) 05/23/2019   UTI (urinary tract infection) 05/23/2019   Right rib fracture 05/23/2019   SIRS (systemic inflammatory response syndrome) ([place]) 05/23/2019   Bacteremia due to Gram-negative bacteria 05/23/2019   Acute bronchiolitis 01/28/2019   Left anterior knee pain 10/24/2016   Right upper quadrant pain 10/24/2016   Urination pain 06/26/2016   DDD (degenerative disc disease), lumbar 05/14/2016   Lipoma of skin 05/14/2016   Vitamin D deficiency, unspecified 05/14/2016   Major neurocognitive disorder, due  to vascular disease, with behavioral disturbance, mild ([place]) 04/15/2016   Severe recurrent major depression with psychotic features ([place]) 04/04/2016   Lupus ([place]) 04/04/2016   GERD (gastroesophageal reflux disease) 04/04/2016   COPD with acute bronchitis ([place]) 04/04/2016   Acute respiratory failure with hypoxia (HCC) 04/03/2016   Encephalopathy, metabolic 70/35/0093   Hypokalemia 04/03/2016   Pain in limb 04/03/2016   Hypothyroidism 04/03/2016   Essential hypertension 04/03/2016   Intentional opiate overdose ([place]) 03/30/2016   Age related osteoporosis 03/19/2016   Chronic midline low back pain without sciatica 03/19/2016   Encounter for long-term (current) use of high-risk medication 03/19/2016   Primary osteoarthritis of both knees 03/19/2016    Orientation RESPIRATION BLADDER Height & Weight     Self, Time, Situation, Place  Normal Continent Weight: 241 lb 10 oz (109.6 kg) Height:  5\' 1"  (154.9 cm)  BEHAVIORAL SYMPTOMS/MOOD NEUROLOGICAL BOWEL NUTRITION STATUS      Continent Diet (Regular)  AMBULATORY STATUS COMMUNICATION OF NEEDS Skin   Limited Assist Verbally Bruising                       Personal Care Assistance Level of Assistance  Bathing, Feeding, Dressing Bathing Assistance: Limited assistance Feeding assistance: Independent Dressing Assistance: Limited assistance     Functional Limitations Info             SPECIAL CARE FACTORS FREQUENCY  PT (By licensed PT), OT (By licensed OT)     PT Frequency: 5 times per week OT Frequency: 5 times per week            Contractures      Additional Factors Info  Code Status, Allergies Code Status Info: partial Allergies Info: nka           Current Medications (01/29/2021):  This is the current hospital active medication list Current Facility-Administered Medications  Medication Dose Route Frequency Provider Last Rate Last Admin   0.9 %  sodium chloride infusion  250 mL Intravenous Continuous Benita Gutter, RPH   Stopped at 01/27/21 1007   acetaminophen (TYLENOL) tablet 650 mg  650 mg Oral Q6H PRN Ivor Costa, MD   650 mg at 01/27/21 0853   albuterol (PROVENTIL) (2.5 MG/3ML) 0.083% nebulizer solution 2.5 mg  2.5 mg Nebulization Q4H PRN Ivor Costa, MD       ceFEPIme (MAXIPIME) 2 g in sodium chloride 0.9 % 100 mL IVPB  2 g Intravenous Q8H Sreenath, Sudheer B, MD 200 mL/hr at 01/29/21 0641 2 g at 01/29/21 0641   Chlorhexidine Gluconate Cloth 2 % PADS 6 each  6 each Topical Q0600 Ivor Costa, MD   6 each at 01/29/21 0955   dextromethorphan-guaiFENesin ([place] DM) 30-600 MG per 12 hr tablet 1 tablet  1 tablet Oral BID PRN Ivor Costa, MD       enoxaparin (LOVENOX) injection 40 mg  40 mg Subcutaneous Q24H Ivor Costa, MD   40 mg at 01/29/21 0955   escitalopram (LEXAPRO) tablet 20 mg  20 mg Oral Daily Ivor Costa, MD   20 mg at 01/29/21 0954   famotidine (PEPCID) tablet 20 mg  20 mg Oral BID Ivor Costa, MD   20 mg at 01/29/21 0954   feeding supplement (ENSURE ENLIVE / ENSURE PLUS) liquid 237 mL  237 mL Oral TID BM Ralene Muskrat B, MD   237 mL at 01/29/21 0955   ferrous sulfate tablet 325 mg  325 mg Oral Q M,W,F Ivor Costa, MD   325 mg at 54/65/68 1275   folic acid (FOLVITE) tablet 1 mg  1 mg Oral Daily Ivor Costa, MD   1 mg at 01/29/21 0954   gabapentin (NEURONTIN) capsule 800 mg  800 mg Oral BID Ivor Costa, MD   800 mg at 01/29/21 0954   ipratropium-albuterol (DUONEB) 0.5-2.5 (3) MG/3ML nebulizer solution 3 mL  3 mL Nebulization Q6H PRN Ivor Costa, MD       lactated ringers infusion   Intravenous Continuous Ralene Muskrat B, MD 75 mL/hr at 01/29/21 0025 New Bag at 01/29/21 0025   levothyroxine (SYNTHROID) tablet 75 mcg  75 mcg Oral QAC breakfast Ivor Costa, MD   75 mcg at 01/29/21 1700   loperamide (IMODIUM) capsule 2 mg  2 mg Oral Q1200 Ivor Costa, MD   2 mg at 01/28/21 1217   methocarbamol (ROBAXIN) tablet 500 mg  500 mg Oral Q6H PRN Ralene Muskrat B, MD   500 mg at 01/29/21 0955    multivitamin with minerals tablet 1 tablet  1 tablet Oral Daily Ralene Muskrat B, MD   1 tablet at 01/29/21 0954   mupirocin ointment (BACTROBAN) 2 % 1 application  1 application Nasal BID Ivor Costa, MD   1 application at 17/49/44 0955   OLANZapine (ZYPREXA) tablet 7.5 mg  7.5 mg Oral QHS Ivor Costa, MD   7.5 mg at 01/28/21 2247   ondansetron (ZOFRAN) injection 4 mg  4 mg Intravenous Q8H PRN Ivor Costa, MD   4 mg at 01/27/21 1724   oxyCODONE-acetaminophen (PERCOCET/ROXICET) 5-325 MG per tablet 1 tablet  1 tablet Oral Q4H PRN Ivor Costa, MD   1 tablet at 01/29/21 [phone]  traZODone (DESYREL) tablet 125 mg  125 mg Oral QHS Ivor Costa, MD   125 mg at 01/28/21 2248     Discharge Medications: Please see discharge summary for a list of discharge medications.  Relevant Imaging Results:  Relevant Lab Results:   Additional Information SS #: [id number] / [facility] patient  Samarah Hogle E Maybelline Kolarik, LCSW

## 2021-01-29 NOTE — TOC Progression Note (Addendum)
Transition of Care [facility]) - Progression Note    Patient Details  Name: Ambree Frances MRN: [id number] Date of Birth: 07/17/1950  [facility]) CM/SW [place], LCSW Phone Number: 01/29/2021, 10:27 AM  Clinical Narrative:   PT is recommending SNF. Called [facility] and spoke with Receptionist who will request that the on call worker call CSW back to discuss.  CSW is starting SNF workup in preparation.   Expected Discharge Plan: Home/Self Care Barriers to Discharge: Continued Medical Work up  Expected Discharge Plan and Services Expected Discharge Plan: Home/Self Care       Living arrangements for the past 2 months: Single Family Home                                       Social Determinants of Health (SDOH) Interventions    Readmission Risk Interventions Readmission Risk Prevention Plan 01/28/2021 01/30/2019  Post Dischage Appt - Complete  Medication Screening - Complete  Transportation Screening Complete Complete  PCP or Specialist Appt within 3-5 Days Complete -  HRI or Home Care Consult Complete -  Social Work Consult for [place] Planning/Counseling Complete -  Palliative Care Screening Not Applicable -  Medication Review Press photographer) Complete -  Some recent data might be hidden

## 2021-01-30 LAB — RESP PANEL BY RT-PCR (FLU A&B, COVID) ARPGX2
Influenza A by PCR: NEGATIVE
Influenza B by PCR: NEGATIVE
SARS Coronavirus 2 by RT PCR: NEGATIVE

## 2021-01-30 LAB — GLUCOSE, CAPILLARY
Glucose-Capillary: 82 mg/dL (ref 70–99)
Glucose-Capillary: 104 mg/dL — ABNORMAL HIGH (ref 70–99)

## 2021-01-30 MED ORDER — ENOXAPARIN SODIUM 60 MG/0.6ML IJ SOSY
0.5000 mg/kg | PREFILLED_SYRINGE | INTRAMUSCULAR | Status: DC
  Administered 2021-01-31: 55 mg via SUBCUTANEOUS
  Filled 2021-01-30: qty 0.55

## 2021-01-30 MED ORDER — OXYCODONE HCL 5 MG PO TABS
5.0000 mg | ORAL_TABLET | ORAL | Status: DC | PRN
Start: 2021-01-30 — End: 2021-01-31
  Administered 2021-01-30 – 2021-01-31 (×4): 5 mg via ORAL
  Filled 2021-01-30 (×4): qty 1

## 2021-01-30 MED ORDER — CIPROFLOXACIN HCL 500 MG PO TABS
500.0000 mg | ORAL_TABLET | Freq: Two times a day (BID) | ORAL | Status: DC
  Administered 2021-01-31: 500 mg via ORAL
  Filled 2021-01-30 (×2): qty 1

## 2021-01-30 MED ORDER — OXYCODONE HCL 5 MG PO TABS: 5.0000 mg | ORAL_TABLET | ORAL | 0 refills | Status: AC | PRN

## 2021-01-30 MED ORDER — CIPROFLOXACIN HCL 500 MG PO TABS: 500.0000 mg | ORAL_TABLET | Freq: Two times a day (BID) | ORAL | 0 refills | Status: AC

## 2021-01-30 NOTE — TOC Progression Note (Signed)
Transition of Care [facility]) - Progression Note    Patient Details  Name: Shelly Freeman MRN: [id number] Date of Birth: May 19, 1950  Transition of Care [facility] CM/SW [place], LCSW Phone Number: 01/30/2021, 3:47 PM  Clinical Narrative:   CSW spoke with Aram Beecham at [facility] and they will have orders written by their doctor tomorrow. They are asking that pt be ready to be picked up by 11:00 AM tomorrow morning. CSW has passed this along to [facility], MD, and RN. [facility] will pick pt up.    Expected Discharge Plan: Home/Self Care Barriers to Discharge: Continued Medical Work up  Expected Discharge Plan and Services Expected Discharge Plan: Home/Self Care       Living arrangements for the past 2 months: Single Family Home Expected Discharge Date: 01/30/21                                     Social Determinants of Health (SDOH) Interventions    Readmission Risk Interventions Readmission Risk Prevention Plan 01/28/2021 01/30/2019  Post Dischage Appt - Complete  Medication Screening - Complete  Transportation Screening Complete Complete  PCP or Specialist Appt within 3-5 Days Complete -  HRI or Home Care Consult Complete -  Social Work Consult for [place] Planning/Counseling Complete -  Palliative Care Screening Not Applicable -  Medication Review [employer] photographer) Complete -  Some recent data might be hidden

## 2021-01-30 NOTE — Discharge Summary (Signed)
Physician Discharge Summary  Shelly Freeman FMB:[id number] DOB: 06-05-1950 DOA: 01/25/2021  PCP: Janna Arch, MD  Admit date: 01/25/2021 Discharge date: 01/30/2021  Admitted From: Home Disposition: Skilled nursing facility  Recommendations for Outpatient Follow-up:  Follow up with PCP in 1-2 weeks   Home Health: No Equipment/Devices: None  Discharge Condition: Stable CODE STATUS: Partial Diet recommendation: Regular  Brief/Interim Summary: 70 y.o. female with medical history significant of hypertension, COPD, hypothyroidism, GERD, lupus, kidney stone, chronic back pain, OSA not on CPAP, positive blood culture with E. 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   Patient speaks Spanish.  History is obtained with help of iPad interpreter.  I also spoke with her granddaughter and her PCP on the phone.  Per report, patient fell in the early morning when she was going to the bathroom. She was laying on the right side for long time.  After fall, she complains of pain in the left hip. Actually, pt complains of pain all over, including both legs, both shoulders, both hands and left hip when I saw pt in ED. Patient is drowsy, mildly confused, but still oriented x3.  Patient moves all extremities.  No facial droop or slurred speech.  Patient states that she has mild shortness of breath, mild dry cough, no chest pain.  No fever or chills.  Patient reports dysuria, burning on urination, and increased urinary frequency.    Patient reports diarrhea.  No nausea, vomiting or abdominal pain. Of note, Dr. Coralie Common from [facility] called me and provided  some important information, which is highly appreciated.  Per Dr. Ovid Curd, patient has been having diarrhea for several weeks.  They are currently doing work-up as outpatient for diarrhea.  Patient had negative C. difficile test 2 weeks ago.  Stool culture is pending.  Patient  finished a course of Flagyl treatment empirically.  Her diarrhea has slightly improved.   Patient was admitted to stepdown status due to persistent hypotension.  Started on midodrine and stress dose steroids.  Also started on peripheral Levophed.  On the morning of 9/23 Levophed turned off as of 5 AM.  Patient's blood pressure remained stable on just midodrine and IV hydrocortisone.  CT imaging survey significant for right-sided pyelonephritis.   Patient's blood pressure stabilized and she was transferred of ICU service on 9/23. Urine culture with Enterobacter.  Sensitive to cefepime.  Patient's main complaint is pain in bilateral feet and neck pain.  On day of discharge patient is medically stable.  IV antibiotics have been converted to p.o.  Will discharge on ciprofloxacin per sensitivities of Enterobacter UTI.  Complete additional 3 days to complete total 7-day course for complicated urinary tract infection.  Patient's main complaint at time of discharge is body pains.  Mainly in bilateral lower extremities and head.  Small amount of oxycodone prescribed on discharge.  Patient will discharge to skilled nursing facility.   Discharge Diagnoses:  Principal Problem:   Severe sepsis with septic shock ([place]) Active Problems:   Hypothyroidism   GERD (gastroesophageal reflux disease)   UTI (urinary tract infection)   COPD (chronic obstructive pulmonary disease) ([place])   Fall at home, initial encounter   HTN (hypertension)   Depression with anxiety   Acute metabolic encephalopathy  Severe sepsis with septic shock due to UTI and right-sided pyelonephritis  Initially patient had blood pressure 113/56, later on developed hypotension which is resistant to IV fluid resuscitation.  After giving 4L of normal saline bolus, patient's blood pressure still in 75/59.   Lactic acid is elevated 1.3, 3.6.   Patient also has leukocytosis with WBC 17.1, tachycardia with heart rate in 96.   Admitted to stepdown,  peripheral Levophed started ICU consulted Levophed weaned off as of 5 AM on 9/23 Transferred to PCU 9/23 Shock physiology resolved  Plan: Discharge to skilled nursing facility.  IV cefepime discontinued.  Transition to p.o. ciprofloxacin at time of discharge.  Complete additional 3 days to complete total 7-day antibiotic course for complicated urinary tract infection.   Hypothyroidism -Synthroid   GERD (gastroesophageal reflux disease) -Pepcid   COPD (chronic obstructive pulmonary disease) ([facility]) -Bronchodilators   Fall at home, initial encounter:  -Therapy ordered -Recommendation for skilled nursing facility -TOC to reach out to [facility] to notify   HTN (hypertension) -Home blood pressure medications on hold for now -Can restart on discharge   Depression with anxiety -Continue home medications   Acute metabolic encephalopathy -Frequent neuro check     Diarrhea Chronic diarrhea per outpatient PCP Negative C. difficile test 2 weeks ago and then again here Formed stool as of 923 Plan: DC isolation As needed Imodium  Discharge Instructions  Discharge Instructions     Diet - low sodium heart healthy   Complete by: As directed    Increase activity slowly   Complete by: As directed       Allergies as of 01/30/2021   No Known Allergies      Medication List     STOP taking these medications    celecoxib 200 MG capsule Commonly known as: CELEBREX   HYDROcodone-acetaminophen 5-325 MG tablet Commonly known as: NORCO/VICODIN   HYDROcodone-acetaminophen 7.5-325 MG tablet Commonly known as: NORCO   isosorbide mononitrate 30 MG 24 hr tablet Commonly known as: IMDUR   LORazepam 0.5 MG tablet Commonly known as: ATIVAN   methocarbamol 500 MG tablet Commonly known as: ROBAXIN   ondansetron 4 MG tablet Commonly known as: ZOFRAN   pantoprazole 20 MG tablet Commonly known as: PROTONIX   traMADol 50 MG tablet Commonly known as: ULTRAM       TAKE  these medications    acetaminophen 325 MG tablet Commonly known as: TYLENOL Take 650 mg by mouth 3 (three) times daily as needed for mild pain. Do not take more than 4 tablets in a day if you take Hydrocodone-Apap too.   albuterol 108 (90 Base) MCG/ACT inhaler Commonly known as: VENTOLIN HFA Inhale 2 puffs into the lungs every 4 (four) hours as needed for wheezing or shortness of breath.   alendronate 70 MG tablet Commonly known as: FOSAMAX Take 70 mg by mouth once a week. Take with a full glass of water on an empty stomach.   ciprofloxacin 500 MG tablet Commonly known as: CIPRO Take 1 tablet (500 mg total) by mouth 2 (two) times daily for 3 days. Start taking on: January 31, 2021   docusate sodium 100 MG capsule Commonly known as: COLACE Take 1 capsule (100 mg total) by mouth 2 (two) times daily.   enoxaparin 40 MG/0.4ML injection Commonly known as: LOVENOX Inject 0.4 mLs (40 mg total) into the skin daily for 14 days.   escitalopram 20 MG tablet Commonly known as: LEXAPRO Take 20 mg by mouth daily.   famotidine 20 MG tablet Commonly known as: PEPCID Take 20 mg by mouth 2 (two) times daily.   ferrous sulfate 325 (65 FE) MG tablet Take 325 mg by  mouth every Monday, Wednesday, and Friday.   folic acid 1 MG tablet Commonly known as: FOLVITE Take 1 mg by mouth daily.   gabapentin 800 MG tablet Commonly known as: NEURONTIN Take 800 mg by mouth 2 (two) times daily.   hydrocortisone cream 1 % Apply 1 application topically 3 (three) times daily as needed for itching.   ipratropium-albuterol 0.5-2.5 (3) MG/3ML Soln Commonly known as: DUONEB Take 3 mLs by nebulization every 6 (six) hours as needed (shortness of breath).   levothyroxine 75 MCG tablet Commonly known as: SYNTHROID Take 75 mcg by mouth daily before breakfast.   lidocaine 5 % Commonly known as: LIDODERM Place 1 patch onto the skin daily. Remove & Discard patch within 12 hours or as directed by MD    loperamide 2 MG capsule Commonly known as: IMODIUM Take 2 mg by mouth daily at 12 noon. For Diarrhea   metoprolol succinate 25 MG 24 hr tablet Commonly known as: TOPROL-XL Take 25 mg by mouth daily.   OLANZapine 7.5 MG tablet Commonly known as: ZYPREXA Take 7.5 mg by mouth at bedtime. What changed: Another medication with the same name was removed. Continue taking this medication, and follow the directions you see here.   oxyCODONE 5 MG immediate release tablet Commonly known as: Oxy IR/ROXICODONE Take 1 tablet (5 mg total) by mouth every 4 (four) hours as needed for up to 3 days for moderate pain. SNF use only   predniSONE 20 MG tablet Commonly known as: DELTASONE Take 20 mg by mouth as needed (asthma attack).   traZODone 100 MG tablet Commonly known as: DESYREL Take 125 mg by mouth at bedtime.   traZODone 50 MG tablet Commonly known as: DESYREL Take 25 mg by mouth at bedtime as needed for sleep.   Vitamin D (Ergocalciferol) 1.25 MG (50000 UNIT) Caps capsule Commonly known as: DRISDOL Take 50,000 Units by mouth every 7 (seven) days.        No Known Allergies  Consultations: ICU   Procedures/Studies: CT ABDOMEN PELVIS WO CONTRAST  Result Date: 01/26/2021 CLINICAL DATA:  Abdominal pain. EXAM: CT ABDOMEN AND PELVIS WITHOUT CONTRAST TECHNIQUE: Multidetector CT imaging of the abdomen and pelvis was performed following the standard protocol without IV contrast. COMPARISON:  August 28, 2020 FINDINGS: Lower chest: Mild atelectasis is seen within the bilateral lung bases. Hepatobiliary: No focal liver abnormality is seen. Tiny, ill-defined gallstones are seen within the lumen of a moderately distended gallbladder. There is no evidence of gallbladder wall thickening or pericholecystic inflammation. Pancreas: Unremarkable. No pancreatic ductal dilatation or surrounding inflammatory changes. Spleen: Normal in size without focal abnormality. Adrenals/Urinary Tract: Adrenal glands  are unremarkable. Kidneys are normal in size, without focal lesions. Prominent bilateral extrarenal pelvis are seen without evidence of renal calculi. Moderate to marked severity right-sided perinephric inflammatory fat stranding is noted. This represents a new finding when compared to the prior study. Bladder is unremarkable. Stomach/Bowel: There is a small hiatal hernia. Surgical sutures are seen within the gastric region. Surgically anastomosed bowel is also seen within the anterior aspect of the mid to upper left abdomen. The appendix is surgically absent. No evidence of bowel wall thickening, distention, or inflammatory changes. Vascular/Lymphatic: No significant vascular findings are present. No enlarged abdominal or pelvic lymph nodes. Reproductive: Uterus and bilateral adnexa are unremarkable. Other: No abdominal wall hernia or abnormality. No abdominopelvic ascites. Musculoskeletal: Multilevel degenerative changes seen throughout the lumbar spine. IMPRESSION: 1. Findings concerning for the presence of acute pyelonephritis involving the right  kidney. Correlation with urinalysis is recommended. 2. Cholelithiasis without evidence of acute cholecystitis. 3. Small hiatal hernia. 4. Evidence of prior gastric and small bowel surgery. Electronically Signed   By: Virgina Norfolk M.D.   On: 01/26/2021 21:11   DG Pelvis 1-2 Views  Result Date: 01/25/2021 CLINICAL DATA:  Fall. EXAM: PELVIS - 1-2 VIEW COMPARISON:  Pelvic radiograph dated 04/15/2017. FINDINGS: There is no acute fracture or dislocation. The bones are osteopenic. Mild bilateral hip arthritic changes. The soft tissues are unremarkable. IMPRESSION: No acute fracture or dislocation. Electronically Signed   By: Anner Crete M.D.   On: 01/25/2021 22:53   CT HEAD WO CONTRAST (5MM)  Result Date: 01/25/2021 CLINICAL DATA:  Trauma. EXAM: CT HEAD WITHOUT CONTRAST CT CERVICAL SPINE WITHOUT CONTRAST TECHNIQUE: Multidetector CT imaging of the head and  cervical spine was performed following the standard protocol without intravenous contrast. Multiplanar CT image reconstructions of the cervical spine were also generated. COMPARISON:  Head CT dated 12/21/2020. FINDINGS: CT HEAD FINDINGS Brain: The ventricles and sulci appropriate size for patient's age. Mild periventricular and deep white matter chronic microvascular ischemic changes noted. There is no acute intracranial hemorrhage. No mass effect or midline shift. No extra-axial fluid collection. Vascular: No hyperdense vessel or unexpected calcification. Skull: Normal. Negative for fracture or focal lesion. Sinuses/Orbits: No acute finding. Other: None CT CERVICAL SPINE FINDINGS Alignment: No acute subluxation. Skull base and vertebrae: No acute fracture. Soft tissues and spinal canal: No prevertebral fluid or swelling. No visible canal hematoma. Disc levels:  Mild degenerative changes. Upper chest: Negative. Other: None IMPRESSION: 1. No acute intracranial pathology. Mild chronic microvascular ischemic changes. 2. No acute/traumatic cervical spine pathology. Electronically Signed   By: Anner Crete M.D.   On: 01/25/2021 23:11   CT Cervical Spine Wo Contrast  Result Date: 01/25/2021 CLINICAL DATA:  Trauma. EXAM: CT HEAD WITHOUT CONTRAST CT CERVICAL SPINE WITHOUT CONTRAST TECHNIQUE: Multidetector CT imaging of the head and cervical spine was performed following the standard protocol without intravenous contrast. Multiplanar CT image reconstructions of the cervical spine were also generated. COMPARISON:  Head CT dated 12/21/2020. FINDINGS: CT HEAD FINDINGS Brain: The ventricles and sulci appropriate size for patient's age. Mild periventricular and deep white matter chronic microvascular ischemic changes noted. There is no acute intracranial hemorrhage. No mass effect or midline shift. No extra-axial fluid collection. Vascular: No hyperdense vessel or unexpected calcification. Skull: Normal. Negative for  fracture or focal lesion. Sinuses/Orbits: No acute finding. Other: None CT CERVICAL SPINE FINDINGS Alignment: No acute subluxation. Skull base and vertebrae: No acute fracture. Soft tissues and spinal canal: No prevertebral fluid or swelling. No visible canal hematoma. Disc levels:  Mild degenerative changes. Upper chest: Negative. Other: None IMPRESSION: 1. No acute intracranial pathology. Mild chronic microvascular ischemic changes. 2. No acute/traumatic cervical spine pathology. Electronically Signed   By: Anner Crete M.D.   On: 01/25/2021 23:11   DG Chest Portable 1 View  Result Date: 01/26/2021 CLINICAL DATA:  Fall. EXAM: PORTABLE CHEST 1 VIEW COMPARISON:  Chest radiograph dated 08/28/2020. FINDINGS: No focal consolidation, pleural effusion or pneumothorax. Mild cardiomegaly. Old healed right rib fractures. No acute osseous pathology. IMPRESSION: 1. No acute cardiopulmonary process. 2. Mild cardiomegaly. Electronically Signed   By: Anner Crete M.D.   On: 01/26/2021 00:08   DG Knee Complete 4 Views Left  Result Date: 01/25/2021 CLINICAL DATA:  Left knee pain. EXAM: LEFT KNEE - COMPLETE 4+ VIEW COMPARISON:  December 21, 2020 FINDINGS: A left knee replacement  is seen. There is no evidence of surrounding lucency to suggest the presence of hardware loosening or infection. No evidence of acute fracture or dislocation. A small joint effusion is noted. IMPRESSION: 1. Left knee replacement without evidence of hardware loosening or infection. 2. Small joint effusion. Electronically Signed   By: Virgina Norfolk M.D.   On: 01/25/2021 22:55   (Echo, Carotid, EGD, Colonoscopy, ERCP)    Subjective: Seen and examined on the day of discharge.  Stable no distress.  Does endorse pain in neck, back, bilateral feet.  Discharge Exam: Vitals:   01/30/21 0752 01/30/21 1117  BP: (!) 151/66 (!) 144/67  Pulse: 71 76  Resp: 17 19  Temp: 98.1 F (36.7 C) 98.3 F (36.8 C)  SpO2: 95% 96%   Vitals:    01/29/21 2056 01/30/21 0614 01/30/21 0752 01/30/21 1117  BP: (!) 123/59 (!) 143/67 (!) 151/66 (!) 144/67  Pulse: 82 72 71 76  Resp: 16 16 17 19   Temp: 99.9 F (37.7 C) 98.1 F (36.7 C) 98.1 F (36.7 C) 98.3 F (36.8 C)  TempSrc: Oral Oral  Axillary  SpO2: 98% 96% 95% 96%  Weight:      Height:        General: Pt is alert, awake, not in acute distress Cardiovascular: RRR, S1/S2 +, no rubs, no gallops Respiratory: CTA bilaterally, no wheezing, no rhonchi Abdominal: Soft, NT, ND, bowel sounds + Extremities: no edema, no cyanosis    The results of significant diagnostics from this hospitalization (including imaging, microbiology, ancillary and laboratory) are listed below for reference.     Microbiology: Recent Results (from the past 240 hour(s))  Urine Culture     Status: Abnormal   Collection Time: 01/26/21  5:14 AM   Specimen: Urine, Clean Catch  Result Value Ref Range Status   Specimen Description   Final    URINE, CLEAN CATCH Performed at [facility], [address], [place], [place] [address]    Special Requests   Final    NONE Performed at [facility], [place], [place] [address]    Culture >=100,000 COLONIES/mL ENTEROBACTER CLOACAE (A)  Final   Report Status 01/28/2021 FINAL  Final   Organism ID, Bacteria ENTEROBACTER CLOACAE (A)  Final      Susceptibility   Enterobacter cloacae - MIC*    CEFAZOLIN >=64 RESISTANT Resistant     CEFEPIME <=0.12 SENSITIVE Sensitive     CIPROFLOXACIN 0.5 SENSITIVE Sensitive     GENTAMICIN >=16 RESISTANT Resistant     IMIPENEM <=0.25 SENSITIVE Sensitive     NITROFURANTOIN 64 INTERMEDIATE Intermediate     TRIMETH/SULFA >=320 RESISTANT Resistant     PIP/TAZO <=4 SENSITIVE Sensitive     * >=100,000 COLONIES/mL ENTEROBACTER CLOACAE  Resp Panel by RT-PCR (Flu A&B, Covid) Nasopharyngeal Swab     Status: None   Collection Time: 01/26/21  9:14 AM   Specimen: Nasopharyngeal Swab; Nasopharyngeal(NP)  swabs in vial transport medium  Result Value Ref Range Status   SARS Coronavirus 2 by RT PCR NEGATIVE NEGATIVE Final    Comment: (NOTE) SARS-CoV-2 target nucleic acids are NOT DETECTED.  The SARS-CoV-2 RNA is generally detectable in upper respiratory specimens during the acute phase of infection. The lowest concentration of SARS-CoV-2 viral copies this assay can detect is 138 copies/mL. A negative result does not preclude SARS-Cov-2 infection and should not be used as the sole basis for treatment or other patient management decisions. A negative result may occur with  improper specimen collection/handling, submission of specimen other than nasopharyngeal swab, presence of viral mutation(s) within the areas targeted by this assay, and inadequate number of viral copies(<138 copies/mL). A negative result must be combined with clinical observations, patient history, and epidemiological information. The expected result is Negative.  Fact Sheet for Patients:  EntrepreneurPulse.com.au  Fact Sheet for Healthcare Providers:  IncredibleEmployment.be  This test is no t yet approved or cleared by the [place] FDA and  has been authorized for detection and/or diagnosis of SARS-CoV-2 by FDA under an Emergency Use Authorization (EUA). This EUA will remain  in effect (meaning this test can be used) for the duration of the COVID-19 declaration under Section 564(b)(1) of the Act, 21 U.S.C.section 360bbb-3(b)(1), unless the authorization is terminated  or revoked sooner.       Influenza A by PCR NEGATIVE NEGATIVE Final   Influenza B by PCR NEGATIVE NEGATIVE Final    Comment: (NOTE) The Xpert Xpress SARS-CoV-2/FLU/RSV plus assay is intended as an aid in the diagnosis of influenza from Nasopharyngeal swab specimens and should not be used as a sole basis for treatment. Nasal washings and aspirates are unacceptable for Xpert Xpress  SARS-CoV-2/FLU/RSV testing.  Fact Sheet for Patients: EntrepreneurPulse.com.au  Fact Sheet for Healthcare Providers: IncredibleEmployment.be  This test is not yet approved or cleared by the [place] FDA and has been authorized for detection and/or diagnosis of SARS-CoV-2 by FDA under an Emergency Use Authorization (EUA). This EUA will remain in effect (meaning this test can be used) for the duration of the COVID-19 declaration under Section 564(b)(1) of the Act, 21 U.S.C. section 360bbb-3(b)(1), unless the authorization is terminated or revoked.  Performed at [facility], [address] [place], [place] [address]   Culture, blood (x 2)     Status: None (Preliminary result)   Collection Time: 01/26/21  9:15 AM   Specimen: BLOOD  Result Value Ref Range Status   Specimen Description BLOOD  RAC  Final   Special Requests   Final    BOTTLES DRAWN AEROBIC AND ANAEROBIC Blood Culture results may not be optimal due to an inadequate volume of blood received in culture bottles   Culture   Final    NO GROWTH 4 DAYS Performed at [facility], [address], [place], [place] [address]    Report Status PENDING  Incomplete  Culture, blood (x 2)     Status: None (Preliminary result)   Collection Time: 01/26/21  2:58 PM   Specimen: BLOOD  Result Value Ref Range Status   Specimen Description BLOOD BLOOD LEFT HAND  Final   Special Requests   Final    BOTTLES DRAWN AEROBIC AND ANAEROBIC Blood Culture adequate volume   Culture   Final    NO GROWTH 4 DAYS Performed at [facility], [address] [place], [place] [address]    Report Status PENDING  Incomplete  MRSA Next Gen by PCR, Nasal     Status: Abnormal   Collection Time: 01/26/21  9:25 PM   Specimen: Nasal Mucosa; Nasal Swab  Result Value Ref Range Status   MRSA by PCR Next Gen DETECTED (A) NOT DETECTED Final    Comment: RESULT CALLED TO, READ BACK BY AND  VERIFIED WITH: [place] [id number] 01/26/21 HNM Performed at [facility], [place], [place] [address]   C Difficile Quick Screen w PCR reflex     Status: None   Collection Time: 01/27/21  6:35 AM  Specimen: STOOL  Result Value Ref Range Status   C Diff antigen NEGATIVE NEGATIVE Final   C Diff toxin NEGATIVE NEGATIVE Final   C Diff interpretation No C. difficile detected.  Final    Comment: Performed at [facility], [address] [place], [place] [address]  Gastrointestinal Panel by PCR , Stool     Status: None   Collection Time: 01/27/21  6:35 AM   Specimen: STOOL  Result Value Ref Range Status   Campylobacter species NOT DETECTED NOT DETECTED Final   Plesimonas shigelloides NOT DETECTED NOT DETECTED Final   Salmonella species NOT DETECTED NOT DETECTED Final   Yersinia enterocolitica NOT DETECTED NOT DETECTED Final   Vibrio species NOT DETECTED NOT DETECTED Final   Vibrio cholerae NOT DETECTED NOT DETECTED Final   Enteroaggregative E coli (EAEC) NOT DETECTED NOT DETECTED Final   Enteropathogenic E coli (EPEC) NOT DETECTED NOT DETECTED Final   Enterotoxigenic E coli (ETEC) NOT DETECTED NOT DETECTED Final   Shiga like toxin producing E coli (STEC) NOT DETECTED NOT DETECTED Final   Shigella/Enteroinvasive E coli (EIEC) NOT DETECTED NOT DETECTED Final   Cryptosporidium NOT DETECTED NOT DETECTED Final   Cyclospora cayetanensis NOT DETECTED NOT DETECTED Final   Entamoeba histolytica NOT DETECTED NOT DETECTED Final   Giardia lamblia NOT DETECTED NOT DETECTED Final   Adenovirus F40/41 NOT DETECTED NOT DETECTED Final   Astrovirus NOT DETECTED NOT DETECTED Final   Norovirus GI/GII NOT DETECTED NOT DETECTED Final   Rotavirus A NOT DETECTED NOT DETECTED Final   Sapovirus (I, II, IV, and V) NOT DETECTED NOT DETECTED Final    Comment: Performed at [facility], [address] [place], [place] [address]     Labs: BNP (last 3 results) No  results for input(s): BNP in the last 8760 hours. Basic Metabolic Panel: Recent Labs  Lab 01/25/21 2234 01/27/21 0641 01/29/21 0810  NA 141 138 143  K 4.5 3.8 4.4  CL 106 108 109  CO2 27 20* 25  GLUCOSE 168* 149* 114*  BUN 12 20 18   CREATININE 0.79 0.70 0.60  CALCIUM 8.4* 7.2* 8.5*   Liver Function Tests: Recent Labs  Lab 01/25/21 2234  AST 21  ALT 12  ALKPHOS 77  BILITOT 0.8  PROT 6.5  ALBUMIN 3.3*   No results for input(s): LIPASE, AMYLASE in the last 168 hours. No results for input(s): AMMONIA in the last 168 hours. CBC: Recent Labs  Lab 01/25/21 2234 01/27/21 0641 01/29/21 0810  WBC 17.1* 22.5* 14.6*  NEUTROABS  --   --  12.4*  HGB 14.2 12.6 12.4  HCT 44.8 38.8 40.1  MCV 93.5 91.9 94.6  PLT 157 124* 119*   Cardiac Enzymes: Recent Labs  Lab 01/25/21 2234  CKTOTAL 146   BNP: Invalid input(s): POCBNP CBG: Recent Labs  Lab 01/29/21 0759 01/29/21 1214 01/29/21 1603 01/30/21 0753 01/30/21 1120  GLUCAP 118* 119* 202* 82 104*   D-Dimer No results for input(s): DDIMER in the last 72 hours. Hgb A1c No results for input(s): HGBA1C in the last 72 hours. Lipid Profile No results for input(s): CHOL, HDL, LDLCALC, TRIG, CHOLHDL, LDLDIRECT in the last 72 hours. Thyroid function studies No results for input(s): TSH, T4TOTAL, T3FREE, THYROIDAB in the last 72 hours.  Invalid input(s): FREET3 Anemia work up No results for input(s): VITAMINB12, FOLATE, FERRITIN, TIBC, IRON, RETICCTPCT in the last 72 hours. Urinalysis    Component Value Date/Time   COLORURINE YELLOW (A) 01/26/2021 3474  APPEARANCEUR CLOUDY (A) 01/26/2021 0514   LABSPEC 1.014 01/26/2021 0514   PHURINE 8.0 01/26/2021 0514   GLUCOSEU NEGATIVE 01/26/2021 0514   HGBUR SMALL (A) 01/26/2021 0514   BILIRUBINUR NEGATIVE 01/26/2021 0514   KETONESUR 20 (A) 01/26/2021 0514   PROTEINUR 30 (A) 01/26/2021 0514   NITRITE NEGATIVE 01/26/2021 0514   LEUKOCYTESUR LARGE (A) 01/26/2021 0514   Sepsis  Labs Invalid input(s): PROCALCITONIN,  WBC,  LACTICIDVEN Microbiology Recent Results (from the past 240 hour(s))  Urine Culture     Status: Abnormal   Collection Time: 01/26/21  5:14 AM   Specimen: Urine, Clean Catch  Result Value Ref Range Status   Specimen Description   Final    URINE, CLEAN CATCH Performed at [facility], [address] [place], [place] [address]    Special Requests   Final    NONE Performed at [facility], [address], [place], [place] [address]    Culture >=100,000 COLONIES/mL ENTEROBACTER CLOACAE (A)  Final   Report Status 01/28/2021 FINAL  Final   Organism ID, Bacteria ENTEROBACTER CLOACAE (A)  Final      Susceptibility   Enterobacter cloacae - MIC*    CEFAZOLIN >=64 RESISTANT Resistant     CEFEPIME <=0.12 SENSITIVE Sensitive     CIPROFLOXACIN 0.5 SENSITIVE Sensitive     GENTAMICIN >=16 RESISTANT Resistant     IMIPENEM <=0.25 SENSITIVE Sensitive     NITROFURANTOIN 64 INTERMEDIATE Intermediate     TRIMETH/SULFA >=320 RESISTANT Resistant     PIP/TAZO <=4 SENSITIVE Sensitive     * >=100,000 COLONIES/mL ENTEROBACTER CLOACAE  Resp Panel by RT-PCR (Flu A&B, Covid) Nasopharyngeal Swab     Status: None   Collection Time: 01/26/21  9:14 AM   Specimen: Nasopharyngeal Swab; Nasopharyngeal(NP) swabs in vial transport medium  Result Value Ref Range Status   SARS Coronavirus 2 by RT PCR NEGATIVE NEGATIVE Final    Comment: (NOTE) SARS-CoV-2 target nucleic acids are NOT DETECTED.  The SARS-CoV-2 RNA is generally detectable in upper respiratory specimens during the acute phase of infection. The lowest concentration of SARS-CoV-2 viral copies this assay can detect is 138 copies/mL. A negative result does not preclude SARS-Cov-2 infection and should not be used as the sole basis for treatment or other patient management decisions. A negative result may occur with  improper specimen collection/handling, submission of specimen other than  nasopharyngeal swab, presence of viral mutation(s) within the areas targeted by this assay, and inadequate number of viral copies(<138 copies/mL). A negative result must be combined with clinical observations, patient history, and epidemiological information. The expected result is Negative.  Fact Sheet for Patients:  EntrepreneurPulse.com.au  Fact Sheet for Healthcare Providers:  IncredibleEmployment.be  This test is no t yet approved or cleared by the [place] FDA and  has been authorized for detection and/or diagnosis of SARS-CoV-2 by FDA under an Emergency Use Authorization (EUA). This EUA will remain  in effect (meaning this test can be used) for the duration of the COVID-19 declaration under Section 564(b)(1) of the Act, 21 U.S.C.section 360bbb-3(b)(1), unless the authorization is terminated  or revoked sooner.       Influenza A by PCR NEGATIVE NEGATIVE Final   Influenza B by PCR NEGATIVE NEGATIVE Final    Comment: (NOTE) The Xpert Xpress SARS-CoV-2/FLU/RSV plus assay is intended as an aid in the diagnosis of influenza from Nasopharyngeal swab specimens and should not be used as a sole basis for treatment. Nasal washings and aspirates are unacceptable for  Xpert Xpress SARS-CoV-2/FLU/RSV testing.  Fact Sheet for Patients: EntrepreneurPulse.com.au  Fact Sheet for Healthcare Providers: IncredibleEmployment.be  This test is not yet approved or cleared by the [place] FDA and has been authorized for detection and/or diagnosis of SARS-CoV-2 by FDA under an Emergency Use Authorization (EUA). This EUA will remain in effect (meaning this test can be used) for the duration of the COVID-19 declaration under Section 564(b)(1) of the Act, 21 U.S.C. section 360bbb-3(b)(1), unless the authorization is terminated or revoked.  Performed at [facility], [address] [place], [place]  [address]   Culture, blood (x 2)     Status: None (Preliminary result)   Collection Time: 01/26/21  9:15 AM   Specimen: BLOOD  Result Value Ref Range Status   Specimen Description BLOOD  RAC  Final   Special Requests   Final    BOTTLES DRAWN AEROBIC AND ANAEROBIC Blood Culture results may not be optimal due to an inadequate volume of blood received in culture bottles   Culture   Final    NO GROWTH 4 DAYS Performed at [facility], [address], [place], [place] [address]    Report Status PENDING  Incomplete  Culture, blood (x 2)     Status: None (Preliminary result)   Collection Time: 01/26/21  2:58 PM   Specimen: BLOOD  Result Value Ref Range Status   Specimen Description BLOOD BLOOD LEFT HAND  Final   Special Requests   Final    BOTTLES DRAWN AEROBIC AND ANAEROBIC Blood Culture adequate volume   Culture   Final    NO GROWTH 4 DAYS Performed at [facility], [address], [place], [place] [address]    Report Status PENDING  Incomplete  MRSA Next Gen by PCR, Nasal     Status: Abnormal   Collection Time: 01/26/21  9:25 PM   Specimen: Nasal Mucosa; Nasal Swab  Result Value Ref Range Status   MRSA by PCR Next Gen DETECTED (A) NOT DETECTED Final    Comment: RESULT CALLED TO, READ BACK BY AND VERIFIED WITH: Trina Ao RN [phone] 01/26/21 HNM Performed at [facility], [place], [name] [address]   C Difficile Quick Screen w PCR reflex     Status: None   Collection Time: 01/27/21  6:35 AM   Specimen: STOOL  Result Value Ref Range Status   C Diff antigen NEGATIVE NEGATIVE Final   C Diff toxin NEGATIVE NEGATIVE Final   C Diff interpretation No C. difficile detected.  Final    Comment: Performed at [facility], [address] [place], [place] [address]  Gastrointestinal Panel by PCR , Stool     Status: None   Collection Time: 01/27/21  6:35 AM   Specimen: STOOL  Result Value Ref Range Status   Campylobacter species NOT  DETECTED NOT DETECTED Final   Plesimonas shigelloides NOT DETECTED NOT DETECTED Final   Salmonella species NOT DETECTED NOT DETECTED Final   Yersinia enterocolitica NOT DETECTED NOT DETECTED Final   Vibrio species NOT DETECTED NOT DETECTED Final   Vibrio cholerae NOT DETECTED NOT DETECTED Final   Enteroaggregative E coli (EAEC) NOT DETECTED NOT DETECTED Final   Enteropathogenic E coli (EPEC) NOT DETECTED NOT DETECTED Final   Enterotoxigenic E coli (ETEC) NOT DETECTED NOT DETECTED Final   Shiga like toxin producing E coli (STEC) NOT DETECTED NOT DETECTED Final   Shigella/Enteroinvasive E coli (EIEC) NOT DETECTED NOT DETECTED Final   Cryptosporidium NOT  DETECTED NOT DETECTED Final   Cyclospora cayetanensis NOT DETECTED NOT DETECTED Final   Entamoeba histolytica NOT DETECTED NOT DETECTED Final   Giardia lamblia NOT DETECTED NOT DETECTED Final   Adenovirus F40/41 NOT DETECTED NOT DETECTED Final   Astrovirus NOT DETECTED NOT DETECTED Final   Norovirus GI/GII NOT DETECTED NOT DETECTED Final   Rotavirus A NOT DETECTED NOT DETECTED Final   Sapovirus (I, II, IV, and V) NOT DETECTED NOT DETECTED Final    Comment: Performed at [facility], [address] [place], [place] [address]     Time coordinating discharge: Over 30 minutes  SIGNED:   Sidney Ace, MD  [facility] 01/30/2021, 1:16 PM Pager   If 7PM-7AM, please contact night-coverage

## 2021-01-30 NOTE — TOC Progression Note (Signed)
Transition of Care [facility]) - Progression Note    Patient Details  Name: Bethany Hirt MRN: [id number] Date of Birth: 1950/08/22  Transition of Care [facility]) CM/SW Contact  Eileen Stanford, LCSW Phone Number: 01/30/2021, 1:43 PM  Clinical Narrative: CSW spoke with pt's granddaughter and she is agreeable to [facility]. CSW has called pt's Forensic psychologist and had to leave voicemail.      Expected Discharge Plan: Home/Self Care Barriers to Discharge: Continued Medical Work up  Expected Discharge Plan and Services Expected Discharge Plan: Home/Self Care       Living arrangements for the past 2 months: Single Family Home Expected Discharge Date: 01/30/21                                     Social Determinants of Health (SDOH) Interventions    Readmission Risk Interventions Readmission Risk Prevention Plan 01/28/2021 01/30/2019  Post Dischage Appt - Complete  Medication Screening - Complete  Transportation Screening Complete Complete  PCP or Specialist Appt within 3-5 Days Complete -  HRI or Home Care Consult Complete -  Social Work Consult for [place] Planning/Counseling Complete -  Palliative Care Screening Not Applicable -  Medication Review Press photographer) Complete -  Some recent data might be hidden

## 2021-01-30 NOTE — Care Management Important Message (Signed)
Important Message  Patient Details  Name: Shelly Freeman MRN: [id number] Date of Birth: 01/06/1951   Medicare Important Message Given:  Yes  Reviewed Medicare IM with patient with assistance of video interpreter.     Dannette Barbara 01/30/2021, 3:41 PM

## 2021-01-30 NOTE — Progress Notes (Signed)
PHARMACIST - PHYSICIAN COMMUNICATION  CONCERNING:  Enoxaparin (Lovenox) for DVT Prophylaxis    RECOMMENDATION: Patient was prescribed enoxaparin 40mg  q24 hours for VTE prophylaxis.   Filed Weights   01/26/21 2110  Weight: 109.6 kg (241 lb 10 oz)    Body mass index is 45.65 kg/m.  Estimated Creatinine Clearance: 77 mL/min (by C-G formula based on SCr of 0.6 mg/dL).   Based on [place] patient is candidate for enoxaparin 0.5mg /kg TBW SQ every 24 hours based on BMI being >30.  DESCRIPTION: Pharmacy has adjusted enoxaparin dose per [facility] policy.  Patient is now receiving enoxaparin 55 mg every 24 hours    Benita Gutter 01/30/2021 12:14 PM

## 2021-01-30 NOTE — Plan of Care (Signed)
  Problem: Education: Goal: Knowledge of General Education information will improve Description: Including pain rating scale, medication(s)/side effects and non-pharmacologic comfort measures Outcome: Progressing   Problem: Clinical Measurements: Goal: Will remain free from infection Outcome: Progressing   Problem: Pain Managment: Goal: General experience of comfort will improve Outcome: Not Progressing

## 2021-01-31 LAB — CULTURE, BLOOD (ROUTINE X 2)
Culture: NO GROWTH
Culture: NO GROWTH
Special Requests: ADEQUATE

## 2021-01-31 LAB — GLUCOSE, CAPILLARY
Glucose-Capillary: 85 mg/dL (ref 70–99)
Glucose-Capillary: 97 mg/dL (ref 70–99)

## 2021-01-31 NOTE — Progress Notes (Signed)
Attempted to call report x 3. Placed on hold with no answer.

## 2021-01-31 NOTE — Progress Notes (Signed)
Brief nonbillable note.  Patient was medically stabilized and prepared for discharge on 9/26.  Cannot transport to skilled nursing facility until 9/27.  No clinical changes.  Patient stable for discharge at this date.  Ralene Muskrat MD  No charge

## 2021-01-31 NOTE — TOC Transition Note (Addendum)
Transition of Care [facility]) - CM/SW Discharge Note   Patient Details  Name: Shelly Freeman MRN: [id number] Date of Birth: 1950/08/04  Transition of Care [facility]) CM/SW Contact:  Alberteen Sam, LCSW Phone Number: 01/31/2021, 10:21 AM   Clinical Narrative:     CSW notes patient to discharge to [facility] today, RN to call report to [phone] ask for 600 hall nurse (room 609). [facility] to pick up at 11:00 am.   No other discharge needs identified at this time.   Final next level of care: Skilled Nursing Facility Barriers to Discharge: No Barriers Identified   Patient Goals and CMS Choice Patient states their goals for this hospitalization and ongoing recovery are:: to go home CMS Medicare.gov Compare Post Acute Care list provided to:: Patient Represenative (must comment) Choice offered to / list presented to : [facility] POA / Guardian, Patient  Discharge Placement                Patient to be transferred to facility by: [facility]   Patient and family notified of of transfer: 01/31/21  Discharge Plan and Services                                     Social Determinants of Health (SDOH) Interventions     Readmission Risk Interventions Readmission Risk Prevention Plan 01/28/2021 01/30/2019  Post Dischage Appt - Complete  Medication Screening - Complete  Transportation Screening Complete Complete  PCP or Specialist Appt within 3-5 Days Complete -  HRI or Home Care Consult Complete -  Social Work Consult for [place] Planning/Counseling Complete -  Palliative Care Screening Not Applicable -  Medication Review Press photographer) Complete -  Some recent data might be hidden

## 2021-04-27 IMAGING — DX DG KNEE COMPLETE 4+V*R*
4 series · 4 of 4 positions shown · non-contrast
Comparison: None.

CLINICAL DATA: 69-year-old female with fall and trauma to the right
knee.

EXAM:
RIGHT KNEE - COMPLETE 4+ VIEW

[knee ap]
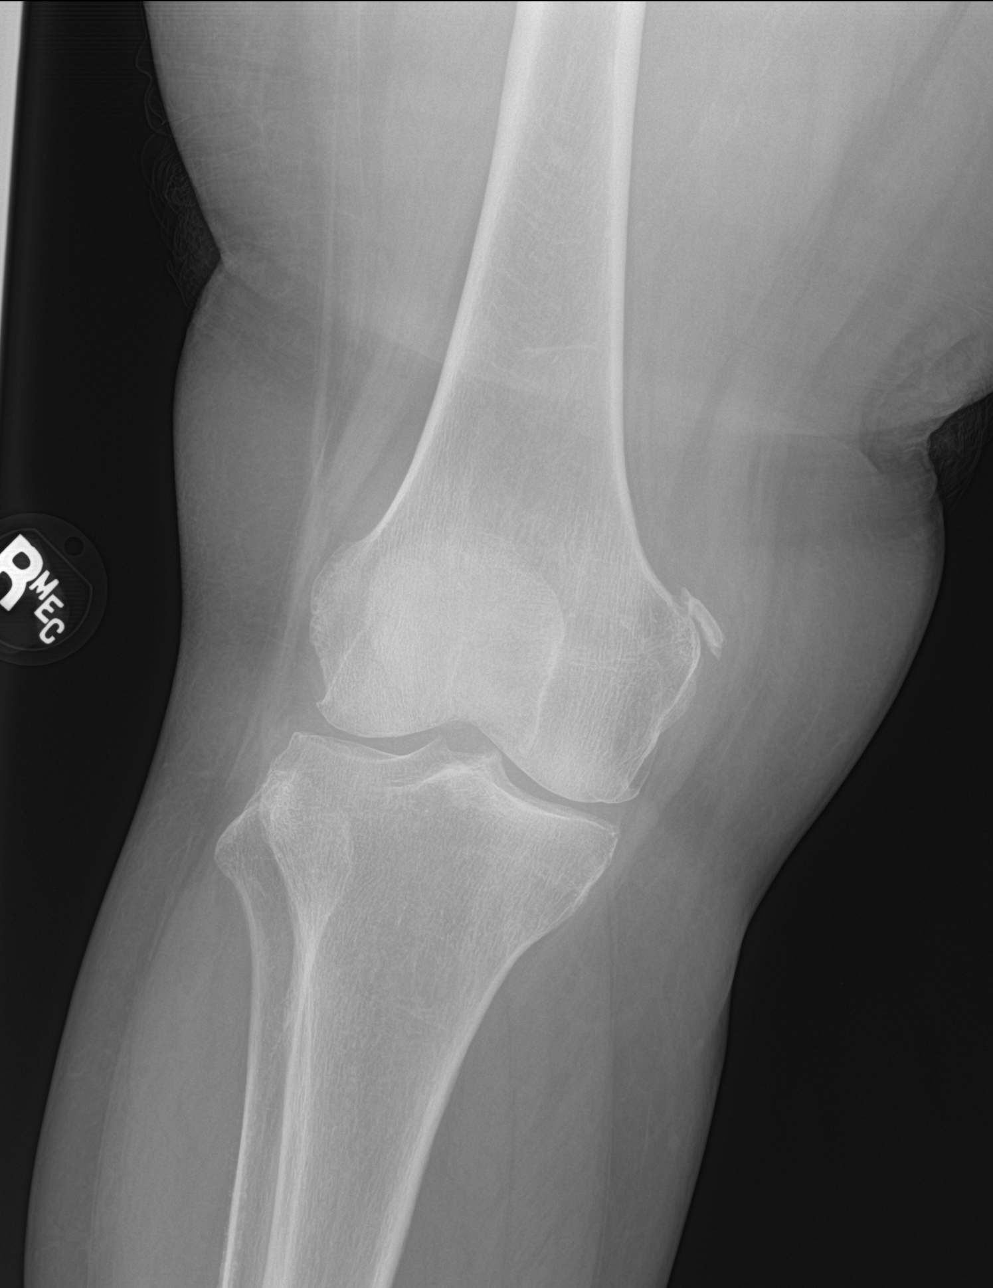

[knee lat]
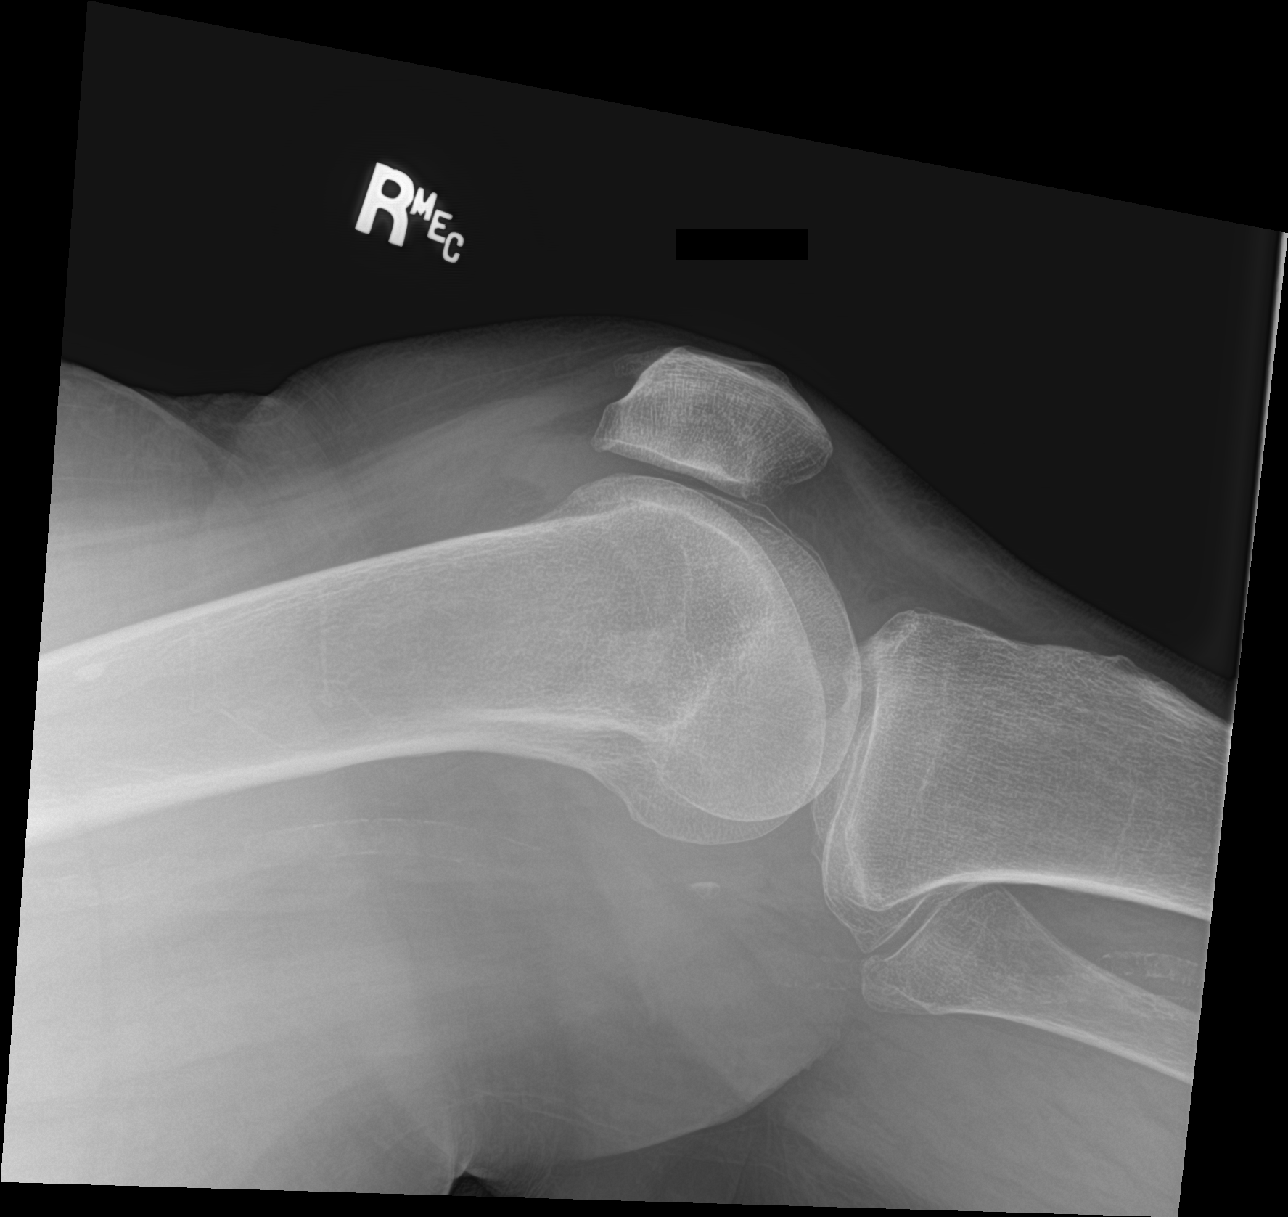

[knee obl (1 of 2)]
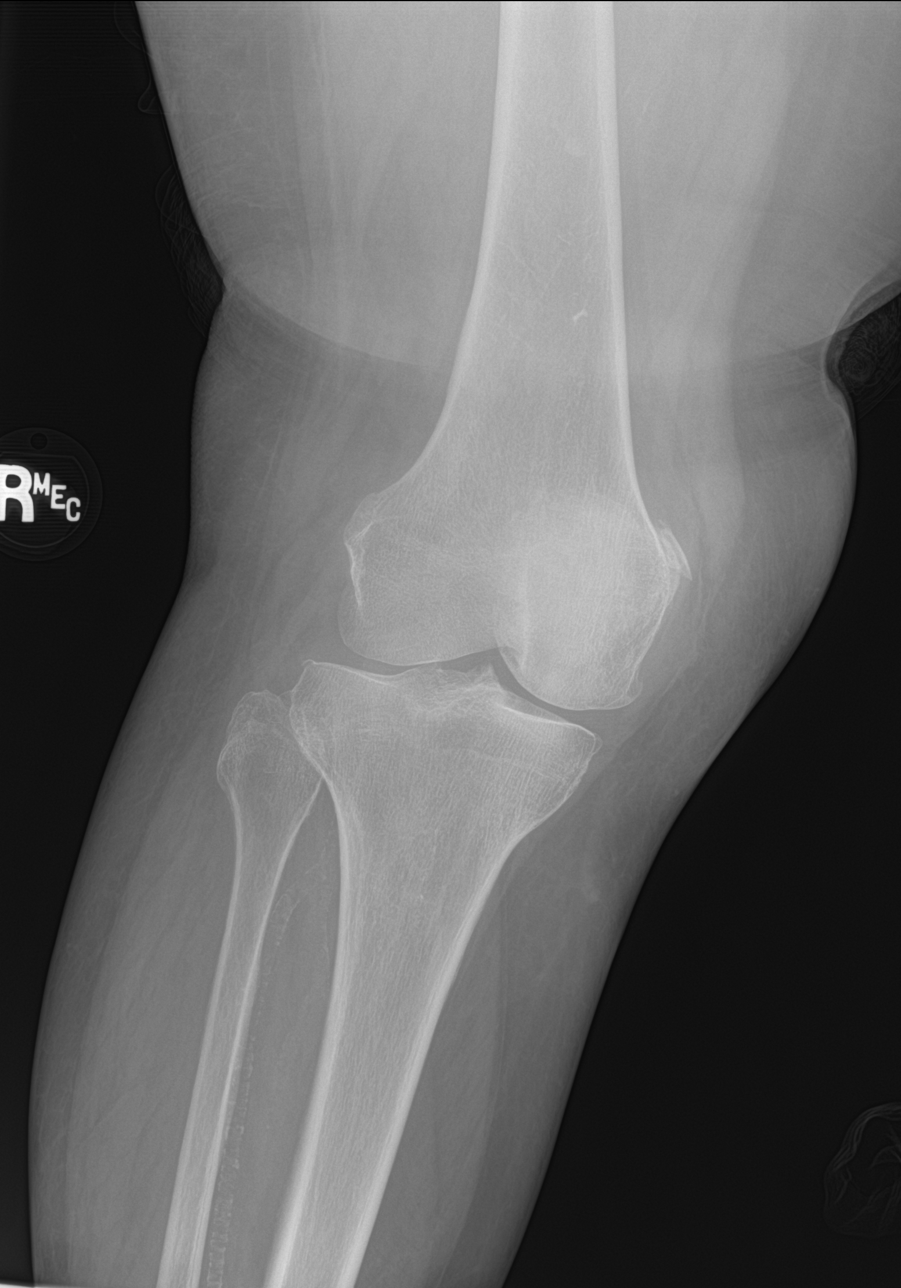

[knee obl (2 of 2)]
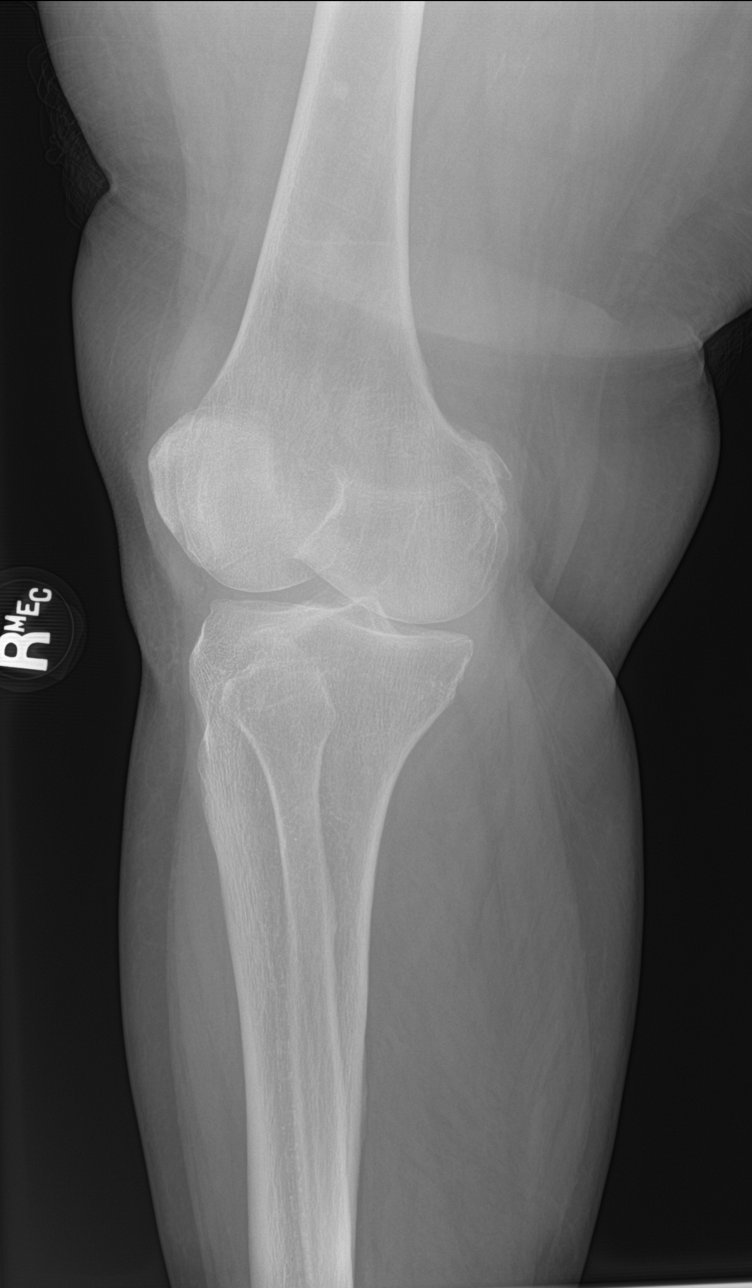

[4 of 4 positions shown; findings below may reference images not displayed]

FINDINGS: There is no acute fracture or dislocation. Mild arthritic changes of
the knee. There is a small suprapatellar effusion. The soft tissues
are unremarkable.
IMPRESSION: 1. No acute fracture or dislocation.
2. Small suprapatellar effusion.

## 2021-05-17 ENCOUNTER — Emergency Department: Payer: Medicare (Managed Care)

## 2021-05-17 ENCOUNTER — Other Ambulatory Visit: Payer: Self-pay

## 2021-05-17 ENCOUNTER — Encounter: Payer: Self-pay | Admitting: Emergency Medicine

## 2021-05-17 ENCOUNTER — Emergency Department
Admission: EM | Admit: 2021-05-17 | Discharge: 2021-05-17 | Disposition: A | Discharge status: Home / Self Care | Payer: Medicare (Managed Care) | Attending: Emergency Medicine | Admitting: Emergency Medicine

## 2021-05-17 DIAGNOSIS — E039 Hypothyroidism, unspecified: Secondary | ICD-10-CM | POA: Insufficient documentation

## 2021-05-17 DIAGNOSIS — Z96652 Presence of left artificial knee joint: Secondary | ICD-10-CM | POA: Insufficient documentation

## 2021-05-17 DIAGNOSIS — I10 Essential (primary) hypertension: Secondary | ICD-10-CM | POA: Diagnosis not present

## 2021-05-17 DIAGNOSIS — M791 Myalgia, unspecified site: Secondary | ICD-10-CM | POA: Insufficient documentation

## 2021-05-17 DIAGNOSIS — M25552 Pain in left hip: Secondary | ICD-10-CM | POA: Insufficient documentation

## 2021-05-17 DIAGNOSIS — R1030 Lower abdominal pain, unspecified: Secondary | ICD-10-CM | POA: Insufficient documentation

## 2021-05-17 DIAGNOSIS — J449 Chronic obstructive pulmonary disease, unspecified: Secondary | ICD-10-CM | POA: Diagnosis not present

## 2021-05-17 DIAGNOSIS — W01198A Fall on same level from slipping, tripping and stumbling with subsequent striking against other object, initial encounter: Secondary | ICD-10-CM | POA: Diagnosis not present

## 2021-05-17 DIAGNOSIS — R519 Headache, unspecified: Secondary | ICD-10-CM | POA: Diagnosis not present

## 2021-05-17 DIAGNOSIS — R300 Dysuria: Secondary | ICD-10-CM | POA: Diagnosis not present

## 2021-05-17 DIAGNOSIS — M25551 Pain in right hip: Secondary | ICD-10-CM | POA: Insufficient documentation

## 2021-05-17 DIAGNOSIS — N12 Tubulo-interstitial nephritis, not specified as acute or chronic: Secondary | ICD-10-CM | POA: Insufficient documentation

## 2021-05-17 DIAGNOSIS — W19XXXA Unspecified fall, initial encounter: Secondary | ICD-10-CM

## 2021-05-17 DIAGNOSIS — K219 Gastro-esophageal reflux disease without esophagitis: Secondary | ICD-10-CM | POA: Insufficient documentation

## 2021-05-17 DIAGNOSIS — Z20822 Contact with and (suspected) exposure to covid-19: Secondary | ICD-10-CM | POA: Insufficient documentation

## 2021-05-17 LAB — COMPREHENSIVE METABOLIC PANEL
ALT: 11 U/L (ref 0–44)
AST: 17 U/L (ref 15–41)
Albumin: 3.5 g/dL (ref 3.5–5.0)
Alkaline Phosphatase: 93 U/L (ref 38–126)
Anion gap: 6 (ref 5–15)
BUN: 12 mg/dL (ref 8–23)
CO2: 27 mmol/L (ref 22–32)
Calcium: 9 mg/dL (ref 8.9–10.3)
Chloride: 107 mmol/L (ref 98–111)
Creatinine, Ser: 1.05 mg/dL — ABNORMAL HIGH (ref 0.44–1.00)
GFR, Estimated: 58 mL/min — ABNORMAL LOW (ref >60)
Glucose, Bld: 130 mg/dL — ABNORMAL HIGH (ref 70–99)
Potassium: 4.9 mmol/L (ref 3.5–5.1)
Sodium: 140 mmol/L (ref 135–145)
Total Bilirubin: 0.7 mg/dL (ref 0.3–1.2)
Total Protein: 7 g/dL (ref 6.5–8.1)

## 2021-05-17 LAB — URINALYSIS, ROUTINE W REFLEX MICROSCOPIC
Bacteria, UA: NONE SEEN
Bilirubin Urine: NEGATIVE
Glucose, UA: NEGATIVE mg/dL
Hgb urine dipstick: NEGATIVE
Ketones, ur: NEGATIVE mg/dL
Nitrite: NEGATIVE
Protein, ur: NEGATIVE mg/dL
Specific Gravity, Urine: 1.011 (ref 1.005–1.030)
WBC, UA: >50 WBC/hpf — ABNORMAL HIGH (ref 0–5)
pH: 8 (ref 5.0–8.0)

## 2021-05-17 LAB — LACTIC ACID, PLASMA: Lactic Acid, Venous: 1.7 mmol/L (ref 0.5–1.9)

## 2021-05-17 LAB — CBC WITH DIFFERENTIAL/PLATELET
Abs Immature Granulocytes: 0.05 10*3/uL (ref 0.00–0.07)
Basophils Absolute: 0 10*3/uL (ref 0.0–0.1)
Basophils Relative: 0 %
Eosinophils Absolute: 0.1 10*3/uL (ref 0.0–0.5)
Eosinophils Relative: 1 %
HCT: 46.6 % — ABNORMAL HIGH (ref 36.0–46.0)
Hemoglobin: 14.6 g/dL (ref 12.0–15.0)
Immature Granulocytes: 1 %
Lymphocytes Relative: 11 %
Lymphs Abs: 1 10*3/uL (ref 0.7–4.0)
MCH: 30.7 pg (ref 26.0–34.0)
MCHC: 31.3 g/dL (ref 30.0–36.0)
MCV: 98.1 fL (ref 80.0–100.0)
Monocytes Absolute: 0.5 10*3/uL (ref 0.1–1.0)
Monocytes Relative: 6 %
Neutro Abs: 7.2 10*3/uL (ref 1.7–7.7)
Neutrophils Relative %: 81 %
Platelets: 141 10*3/uL — ABNORMAL LOW (ref 150–400)
RBC: 4.75 MIL/uL (ref 3.87–5.11)
RDW: 15.6 % — ABNORMAL HIGH (ref 11.5–15.5)
WBC: 8.9 10*3/uL (ref 4.0–10.5)
nRBC: 0 % (ref 0.0–0.2)

## 2021-05-17 LAB — RESP PANEL BY RT-PCR (FLU A&B, COVID) ARPGX2
Influenza A by PCR: NEGATIVE
Influenza B by PCR: NEGATIVE
SARS Coronavirus 2 by RT PCR: NEGATIVE

## 2021-05-17 MED ORDER — LACTATED RINGERS IV BOLUS
1000.0000 mL | Freq: Once | INTRAVENOUS | Status: AC
  Administered 2021-05-17: 1000 mL via INTRAVENOUS

## 2021-05-17 MED ORDER — SODIUM CHLORIDE 0.9 % IV SOLN
1.0000 g | Freq: Once | INTRAVENOUS | Status: AC
  Administered 2021-05-17: 1 g via INTRAVENOUS
  Filled 2021-05-17: qty 10

## 2021-05-17 MED ORDER — IOHEXOL 300 MG/ML  SOLN
100.0000 mL | Freq: Once | INTRAMUSCULAR | Status: AC | PRN
  Administered 2021-05-17: 100 mL via INTRAVENOUS

## 2021-05-17 MED ORDER — CEPHALEXIN 500 MG PO CAPS: 500.0000 mg | ORAL_CAPSULE | Freq: Three times a day (TID) | ORAL | 0 refills | Status: AC

## 2021-05-17 MED ORDER — ACETAMINOPHEN 325 MG PO TABS
650.0000 mg | ORAL_TABLET | Freq: Once | ORAL | Status: AC | PRN
  Administered 2021-05-17: 650 mg via ORAL
  Filled 2021-05-17: qty 2

## 2021-05-17 MED ORDER — IBUPROFEN 600 MG PO TABS
600.0000 mg | ORAL_TABLET | Freq: Once | ORAL | Status: AC
  Administered 2021-05-17: 600 mg via ORAL
  Filled 2021-05-17: qty 1

## 2021-05-17 NOTE — ED Notes (Signed)
Contact person called on informed pt is being discharged from ED. Granddaughter states that she is going to contact [facility] and see if they offer rides to pt.

## 2021-05-17 NOTE — ED Notes (Signed)
Per granddaughter [facility] is able to arrange transport for pt, unable to give ETA at this time

## 2021-05-17 NOTE — ED Notes (Signed)
Pt states that she got a call from [facility] confirming they would pick her up and return he to her home. Pt provided underwear and paper scrub pants. Used a walker to get to bathroom then was wheeled out to waiting room to wait for pick up.

## 2021-05-17 NOTE — ED Triage Notes (Signed)
Pt arrived via [facility] from home where she hit her medical alert button due to rolling over and off the bed, hitting her head on bed side toilet and her bottom landing on floor. Pt c/o bilateral hip/pelvis pain and posterior head pain. Pt denies LOC. Per EMS, pt was able to stand and pivot to stretcher.   CBG 149

## 2021-05-17 NOTE — ED Provider Notes (Signed)
[facility] Provider Note    Event Date/Time   First MD Initiated Contact with Patient 05/17/21 0410     (approximate)   History   Fall   HPI  Shelly Freeman is a 71 y.o. female  history of COPD, hypertension, lupus, hypothyroidism, who presents for evaluation after a fall.  Patient reports that she rolled out of bed this evening.  She hit her head on the bedside commode and fell onto her bottom.  She is complaining of bilateral hip pain and a headache.  Patient does report that over the last 2 days she has had dysuria, body aches, and lower cramping abdominal pain.  She denies nausea, vomiting, or diarrhea.  She also has a mild cough and a sore throat.     Past Medical History:  Diagnosis Date   Arthritis    Collagen vascular disease ([place])    COPD (chronic obstructive pulmonary disease) (HCC)    DDD (degenerative disc disease), lumbar 05/14/2016   Depression    Dizziness    Dyspnea    GERD (gastroesophageal reflux disease)    Heart valve problem    per grandaughter need surgery but they told her may not survive;   History of kidney stones    Hypertension    Hypothyroidism    Lupus ([place])    Neuromuscular disorder ([place])    Sleep apnea    no CPAP   Thyroid disease    Vitamin D deficiency 05/14/2016    Past Surgical History:  Procedure Laterality Date   APPENDECTOMY     BREAST BIOPSY Left    neg   CESAREAN SECTION     COLONOSCOPY WITH PROPOFOL N/A 07/01/2017   Procedure: COLONOSCOPY WITH PROPOFOL;  Surgeon: Manya Silvas, MD;  Location: [facility] ENDOSCOPY;  Service: Endoscopy;  Laterality: N/A;   ESOPHAGOGASTRODUODENOSCOPY (EGD) WITH PROPOFOL N/A 07/01/2017   Procedure: ESOPHAGOGASTRODUODENOSCOPY (EGD) WITH PROPOFOL;  Surgeon: Manya Silvas, MD;  Location: [facility] ENDOSCOPY;  Service: Endoscopy;  Laterality: N/A;   GASTRIC BYPASS     LITHOTRIPSY     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   Procedure: TOTAL KNEE ARTHROPLASTY;  Surgeon:  Hessie Knows, MD;  Location: [facility] ORS;  Service: Orthopedics;  Laterality: Left;   TUBAL LIGATION       Physical Exam   Triage Vital Signs: ED Triage Vitals  Enc Vitals Group     BP 05/17/21 0044 (!) 117/48     Pulse Rate 05/17/21 0044 96     Resp 05/17/21 0044 18     Temp 05/17/21 0044 (!) 102.3 F (39.1 C)     Temp Source 05/17/21 0044 Oral     SpO2 05/17/21 0044 94 %     Weight 05/17/21 0112 241 lb 10 oz (109.6 kg)     Height 05/17/21 0112 5\' 1"  (1.549 m)     Head Circumference --      Peak Flow --      Pain Score --      Pain Loc --      Pain Edu? --      Excl. in [place]? --     Most recent vital signs: Vitals:   05/17/21 0520 05/17/21 0605  BP: (!) 109/42 (!) 110/98  Pulse: 90 83  Resp: 19 (!) 21  Temp:    SpO2: 95% 95%     Constitutional: Alert and oriented. Well appearing and in no apparent distress. HEENT:      Head: Normocephalic and atraumatic.  Eyes: Conjunctivae are normal. Sclera is non-icteric.       Mouth/Throat: Mucous membranes are moist.       Neck: Supple with no signs of meningismus. Cardiovascular: Regular rate and rhythm. No murmurs, gallops, or rubs. 2+ symmetrical distal pulses are present in all extremities.  Respiratory: Normal respiratory effort. Lungs are clear to auscultation bilaterally.  Gastrointestinal: Soft, mild diffuse tenderness, and non distended with positive bowel sounds. No rebound or guarding. Genitourinary: No CVA tenderness. Musculoskeletal:  No edema, cyanosis, or erythema of extremities. Neurologic: Normal speech and language. Face is symmetric. Moving all extremities. No gross focal neurologic deficits are appreciated. Skin: Skin is warm, dry and intact. No rash noted. Psychiatric: Mood and affect are normal. Speech and behavior are normal.  ED Results / Procedures / Treatments   Labs (all labs ordered are listed, but only abnormal results are displayed) Labs Reviewed  COMPREHENSIVE METABOLIC PANEL -  Abnormal; Notable for the following components:      Result Value   Glucose, Bld 130 (*)    Creatinine, Ser 1.05 (*)    GFR, Estimated 58 (*)    All other components within normal limits  CBC WITH DIFFERENTIAL/PLATELET - Abnormal; Notable for the following components:   HCT 46.6 (*)    RDW 15.6 (*)    Platelets 141 (*)    All other components within normal limits  URINALYSIS, ROUTINE W REFLEX MICROSCOPIC - Abnormal; Notable for the following components:   Color, Urine YELLOW (*)    APPearance CLOUDY (*)    Leukocytes,Ua LARGE (*)    WBC, UA >50 (*)    All other components within normal limits  RESP PANEL BY RT-PCR (FLU A&B, COVID) ARPGX2  URINE CULTURE  LACTIC ACID, PLASMA     EKG  none   RADIOLOGY I, Rudene Re, attending MD, have personally viewed and interpreted the images obtained during this visit as below:  Chest x-ray with no signs of pneumonia, x-ray of the pelvis with no signs of fracture, CT of the head and cervical spine with no acute traumatic injuries.  CT abdomen pelvis concerning for pyelonephritis   ___________________________________________________ Interpretation by Radiologist:  DG Chest 2 View  Result Date: 05/17/2021 CLINICAL DATA:  Fever and cough. EXAM: CHEST - 2 VIEW COMPARISON:  Chest radiograph dated 01/25/2021. FINDINGS: No focal consolidation, pleural effusion, pneumothorax the diffuse chronic interstitial coarsening. Mild cardiomegaly. No acute osseous pathology. Degenerative changes of the spine with IMPRESSION: No active cardiopulmonary disease. Electronically Signed   By: Anner Crete M.D.   On: 05/17/2021 02:41   DG Pelvis 1-2 Views  Result Date: 05/17/2021 CLINICAL DATA:  Fall injury. EXAM: PELVIS - 1-2 VIEW COMPARISON:  CT abdomen and pelvis and reconstructions 01/26/2021. FINDINGS: There is mild osteopenia without single-view AP evidence of fractures. There is no widening of the SI joints, pubic symphysis with mild spurring of  both. Enthesopathic changes of the pelvic wings are again noted and partial joint space loss of both superior hips with tiny acetabular osteophytes. Degenerative change of the included lower lumbar segments are again shown. IMPRESSION: Osteopenia and degenerative change without evidence of fractures. Comparison to the previous exam reveals no significant interval change. Electronically Signed   By: Telford Nab M.D.   On: 05/17/2021 00:53   CT Head Wo Contrast  Result Date: 05/17/2021 CLINICAL DATA:  Neck trauma. EXAM: CT HEAD WITHOUT CONTRAST CT CERVICAL SPINE WITHOUT CONTRAST TECHNIQUE: Multidetector CT imaging of the head and cervical spine was performed following  the standard protocol without intravenous contrast. Multiplanar CT image reconstructions of the cervical spine were also generated. RADIATION DOSE REDUCTION: This exam was performed according to the departmental dose-optimization program which includes automated exposure control, adjustment of the mA and/or kV according to patient size and/or use of iterative reconstruction technique. COMPARISON:  CT dated 01/25/2021. FINDINGS: CT HEAD FINDINGS Brain: The ventricles and sulci appropriate size for patient's age. Mild periventricular and deep white matter chronic microvascular ischemic changes noted. There is no acute intracranial hemorrhage. No mass effect or midline shift. No extra-axial fluid collection. Vascular: No hyperdense vessel or unexpected calcification. Skull: Normal. Negative for fracture or focal lesion. Sinuses/Orbits: No acute finding. Other: None CT CERVICAL SPINE FINDINGS Alignment: No acute subluxation. Skull base and vertebrae: No acute fracture. Soft tissues and spinal canal: No prevertebral fluid or swelling. No visible canal hematoma. Disc levels:  Multilevel degenerative changes. Upper chest: Negative. Other: None IMPRESSION: 1. No acute intracranial pathology. Mild chronic microvascular ischemic changes. 2. No acute  cervical spine fracture or subluxation. Electronically Signed   By: Anner Crete M.D.   On: 05/17/2021 01:13   CT Cervical Spine Wo Contrast  Result Date: 05/17/2021 CLINICAL DATA:  Neck trauma. EXAM: CT HEAD WITHOUT CONTRAST CT CERVICAL SPINE WITHOUT CONTRAST TECHNIQUE: Multidetector CT imaging of the head and cervical spine was performed following the standard protocol without intravenous contrast. Multiplanar CT image reconstructions of the cervical spine were also generated. RADIATION DOSE REDUCTION: This exam was performed according to the departmental dose-optimization program which includes automated exposure control, adjustment of the mA and/or kV according to patient size and/or use of iterative reconstruction technique. COMPARISON:  CT dated 01/25/2021. FINDINGS: CT HEAD FINDINGS Brain: The ventricles and sulci appropriate size for patient's age. Mild periventricular and deep white matter chronic microvascular ischemic changes noted. There is no acute intracranial hemorrhage. No mass effect or midline shift. No extra-axial fluid collection. Vascular: No hyperdense vessel or unexpected calcification. Skull: Normal. Negative for fracture or focal lesion. Sinuses/Orbits: No acute finding. Other: None CT CERVICAL SPINE FINDINGS Alignment: No acute subluxation. Skull base and vertebrae: No acute fracture. Soft tissues and spinal canal: No prevertebral fluid or swelling. No visible canal hematoma. Disc levels:  Multilevel degenerative changes. Upper chest: Negative. Other: None IMPRESSION: 1. No acute intracranial pathology. Mild chronic microvascular ischemic changes. 2. No acute cervical spine fracture or subluxation. Electronically Signed   By: Anner Crete M.D.   On: 05/17/2021 01:13   CT ABDOMEN PELVIS W CONTRAST  Result Date: 05/17/2021 CLINICAL DATA:  71 year old female with history of abdominal pain and pelvic pain after a fall. EXAM: CT ABDOMEN AND PELVIS WITH CONTRAST TECHNIQUE:  Multidetector CT imaging of the abdomen and pelvis was performed using the standard protocol following bolus administration of intravenous contrast. CONTRAST:  161mL OMNIPAQUE IOHEXOL 300 MG/ML  SOLN COMPARISON:  CT the abdomen and pelvis 01/26/2021. FINDINGS: Lower chest: Small hiatal hernia. Hepatobiliary: No suspicious cystic or solid hepatic lesions. No intra or extrahepatic biliary ductal dilatation. Gallbladder is normal in appearance. Pancreas: No pancreatic mass. No pancreatic ductal dilatation. No pancreatic or peripancreatic fluid collections or inflammatory changes. Spleen: Unremarkable. Adrenals/Urinary Tract: No suspicious renal lesions. No hydroureteronephrosis. There is some asymmetric right-sided perinephric soft tissue stranding which is nonspecific, and appears chronic when compared to prior CT the abdomen and pelvis 01/26/2021. Urinary bladder wall is slightly irregular and asymmetrically thickened, most evident along the posterior wall and posterolaterally adjacent to the right ureterovesicular junction. Subtle urothelial enhancement is  also noted in these regions, best appreciated on axial image 83 of series 2, without discrete urothelial mass confidently identified. Bilateral adrenal glands are normal in appearance. 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 No pathologic dilatation of small bowel or colon. The appendix is not confidently identified and may be surgically absent. Regardless, there are no inflammatory changes noted adjacent to the cecum to suggest the presence of an acute appendicitis at this time. Vascular/Lymphatic: No aneurysm identified in the visualized abdominal vasculature. No lymphadenopathy noted in the abdomen or pelvis. Reproductive: Uterus is either severely atrophic or there has been prior supracervical hysterectomy. Ovaries are atrophic. Other: No significant volume of ascites.  No pneumoperitoneum. Musculoskeletal: There are  no acute displaced fractures or aggressive appearing lytic or blastic lesions noted in the visualized portions of the skeleton. IMPRESSION: 1. There is some chronic right-sided perinephric soft tissue stranding which appears similar to prior CT the abdomen and pelvis 01/26/2021. In the appropriate clinical setting, findings could be reflective of pyelonephritis, although the enhancement pattern of the right kidney is generally unremarkable, and not clearly indicative of acute pyelonephritis at this time; correlation with urinalysis is recommended. Additionally, there is some asymmetric mural thickening along the posterior and right posterolateral wall of the urinary bladder with some mild urothelial enhancement in this region. This is more asymmetric than typically seen with cystitis. Although no definite urothelial masses confidently identified, further evaluation by Urology is recommended in the near future to exclude the possibility of urothelial neoplasm. 2. No other potentially acute findings are noted elsewhere in the abdomen or pelvis. 3. Aortic atherosclerosis. 4. Additional incidental findings, as above. Electronically Signed   By: Vinnie Langton M.D.   On: 05/17/2021 05:56      PROCEDURES:  Critical Care performed: No  Procedures    IMPRESSION / MDM / ASSESSMENT AND PLAN / ED COURSE  I reviewed the triage vital signs and the nursing notes.  71 y.o. female  history of COPD, hypertension, lupus, hypothyroidism, who presents for evaluation after a fall after rolling out of bed this evening.  Patient hit her head and is complaining of diffuse headache and bilateral hip pain.  She is not on blood thinners, no LOC.  No signs of trauma of the head, no midline spine tenderness.  Full painless range of motion of all the joints in the lower extremities including the hips with no tenderness or deformities.  Patient also has had abdominal pain, dysuria, cough, sore throat and nausea.  Here had a fever  of 102F but no tachycardia, no tachypnea, no hypoxia.  Abdomen is soft with diffuse tenderness, no localized tenderness, rebound or guarding.  Lungs are clear to auscultation with no wheezing or crackles.  Normal work of breathing and normal sats.  Ddx: For the fever will check for COVID, flu, pneumonia, UTI, pyelonephritis, diverticulitis, appendicitis, colitis.   Plan: CT head and cervical spine to rule out traumatic injury.  Bilateral pelvis and hip x-rays.  COVID and flu swabs, chest x-ray, urinalysis, CBC, chemistry panel, lactic acid.  Patient placed on telemetry for close monitoring of cardiorespiratory status.  Tylenol given for fever and ibuprofen for headache.   MEDICATIONS GIVEN IN ED: Medications  cefTRIAXone (ROCEPHIN) 1 g in sodium chloride 0.9 % 100 mL IVPB (1 g Intravenous New Bag/Given 05/17/21 0630)  acetaminophen (TYLENOL) tablet 650 mg (650 mg Oral Given 05/17/21 0222)  ibuprofen (ADVIL) tablet 600 mg (600 mg Oral Given 05/17/21 0506)  lactated ringers  bolus 1,000 mL (0 mLs Intravenous Stopped 05/17/21 0630)  iohexol (OMNIPAQUE) 300 MG/ML solution 100 mL (100 mLs Intravenous Contrast Given 05/17/21 0535)     ED COURSE: Chest x-ray with no signs of pneumonia.  CT head and cervical spines with no signs of traumatic injury.  Pelvis x-ray with no signs of traumatic injury.  No leukocytosis.  Mild stable thrombocytopenia.  No electrolyte derangements.  Mild elevated creatinine for which patient was given fluids.  Lactic is normal.  COVID and flu negative.  UA with large leuks and greater than 50 WBCs but no bacteria or nitrites.  Therefore we will proceed with a CT of the abdomen to try to identify source of patient's infection.  _________________________ 6:29 AM on 05/17/2021 ----------------------------------------- CT concerning for possible pyelonephritis although radiologist did recommend a follow-up with urology as an outpatient to rule out malignancy due to the asymmetric  enhancement of the bladder and the kidney.  I discussed this recommendation and findings with the patient.  We will treat with Rocephin and send urine for culture.  With normal white count, no tachycardia, normal lactic acidosis I feel the patient is safe for discharge home with outpatient antibiotics although admission was considered.  Recommended close follow-up with PCP and discussed my standard return precautions for fevers after 24 hours of first dose of antibiotics, new or worsening abdominal pain, flank pain, nausea or vomiting.   Consults: None   EMR reviewed including patient's last visit with her rheumatology for her lupus.        FINAL CLINICAL IMPRESSION(S) / ED DIAGNOSES   Final diagnoses:  Pyelonephritis     Rx / DC Orders   ED Discharge Orders          Ordered    cephALEXin (KEFLEX) 500 MG capsule  3 times daily        05/17/21 5093    Ambulatory referral to Urology       Comments: Abnormal CT of the bladder and kidney   05/17/21 2671             Note:  This document was prepared using Dragon voice recognition software and may include unintentional dictation errors.   Alfred Levins, [place], MD 05/17/21 0630

## 2021-05-17 NOTE — Discharge Instructions (Addendum)
Como comentamos, su tomografa computarizada mostr inflamacin de parte de su vejiga y el rin derecho. Aunque esto podra deberse a una infeccin del tracto urinario, es muy importante que realice un seguimiento con urologa para asegurarse de que no se deba a algo ms preocupante, Risk manager. He enviado una derivacin a Development worker, community, pero si no tiene noticias de ellos dentro de una semana, asegrese de llamar para programar una cita. Tome los antibiticos completos segn lo prescrito. Beba mucha agua durante la prxima semana. Regrese a la sala de emergencias por dolor abdominal nuevo o que [place], dolor de [place], [place] o escalofros.  As we discussed the, your CT showed inflammation of the part of your bladder and the right kidney.  Although this could be because of a urinary tract infection, it is very important a follow-up with urology to make sure this is not due to something more severe such as cancer.  I have sent a referral to urology but if you do not hear from them within a week, make sure to call for an appointment.  Take the antibiotics fully as prescribed.  Drink lots of water for the next week.  Return to the emergency room for new or worsening abdominal pain, back pain, fever or chills.

## 2021-05-17 NOTE — ED Notes (Signed)
Tylenol given for fever, pt reports she has been coughing, CXR ordered.

## 2021-05-19 LAB — URINE CULTURE: Culture: >=100000 — AB

## 2021-05-25 ENCOUNTER — Encounter: Payer: Self-pay | Admitting: Urology

## 2021-05-25 ENCOUNTER — Other Ambulatory Visit: Payer: Self-pay

## 2021-05-25 ENCOUNTER — Ambulatory Visit (INDEPENDENT_AMBULATORY_CARE_PROVIDER_SITE_OTHER): Payer: Medicare (Managed Care) | Admitting: Urology

## 2021-05-25 VITALS — BP 132/68 | HR 68 | Ht 61.0 in | Wt 218.3 lb

## 2021-05-25 DIAGNOSIS — R300 Dysuria: Secondary | ICD-10-CM

## 2021-05-25 DIAGNOSIS — N12 Tubulo-interstitial nephritis, not specified as acute or chronic: Secondary | ICD-10-CM

## 2021-05-25 DIAGNOSIS — N3289 Other specified disorders of bladder: Secondary | ICD-10-CM

## 2021-05-25 LAB — MICROSCOPIC EXAMINATION
Bacteria, UA: NONE SEEN
Epithelial Cells (non renal): >10 /hpf — AB (ref 0–10)

## 2021-05-25 LAB — URINALYSIS, COMPLETE
Bilirubin, UA: NEGATIVE
Glucose, UA: NEGATIVE
Leukocytes,UA: NEGATIVE
Nitrite, UA: NEGATIVE
Protein,UA: NEGATIVE
RBC, UA: NEGATIVE
Specific Gravity, UA: 1.025 (ref 1.005–1.030)
Urobilinogen, Ur: 0.2 mg/dL (ref 0.2–1.0)
pH, UA: 5.5 (ref 5.0–7.5)

## 2021-05-25 NOTE — Patient Instructions (Signed)
Cistoscopia Cystoscopy La cistoscopia es un procedimiento que se utiliza para ayudar a Retail buyer y, a Clinical cytogeneticist, tratar afecciones que afectan las vas urinarias inferiores. Las vas urinarias inferiores incluyen la vejiga y Geologist, engineering. La uretra es el conducto por el que drena la orina de la vejiga. La cistoscopia se hace con un instrumento fino en forma de tubo con [name] luz y una cmara en el extremo (cistoscopio). El cistoscopio puede ser duro o flexible, segn el objetivo del procedimiento. El cistoscopio se inserta por la uretra e ingresa a la vejiga. La cistoscopia se puede recomendar en los siguientes casos: Infecciones de las vas urinarias que se repiten. Sangre en la orina (hematuria). Incapacidad para controlar la orina (incontinencia urinaria) o vejiga hiperactiva. Clulas inusuales que se encuentran en Truddie Coco de [place]. Una obstruccin en la uretra, como un clculo urinario. Dolor al [place]. Una anormalidad en la vejiga que se encuentra durante una pielografa intravenosa (PIV) o exploracin por tomografa computarizada (TC). La cistoscopia tambin puede realizarse para extraer Truddie Coco de tejido para examinarla con un microscopio (biopsia). Informe al mdico acerca de lo siguiente: Cualquier alergia que tenga. Todos los [employer] [place], incluidos vitaminas, hierbas, gotas oftlmicas, cremas y medicamentos de venta libre. Problemas previos que usted o algn miembro de su familia hayan tenido con los anestsicos. Cualquier trastorno de la sangre que tenga. Cirugas a las que se haya sometido. Cualquier afeccin mdica que tenga. Si est embarazada o podra estarlo. Cules son los riesgos? En general, se trata de un procedimiento seguro. Sin embargo, pueden ocurrir complicaciones, por ejemplo: Infeccin. Sangrado. Reacciones alrgicas a los medicamentos. Daos a Catering manager u otros rganos. Qu ocurre antes del procedimiento? Medicamentos Consulte al mdico  sobre: Quarry manager o suspender los medicamentos que [place] habitualmente. Esto es muy importante si toma medicamentos para la diabetes o anticoagulantes. Tomar medicamentos como aspirina e ibuprofeno. Estos medicamentos pueden tener un efecto anticoagulante en la [place]. No tome estos medicamentos a menos que el mdico se lo indique. Usar medicamentos de venta libre, vitaminas, hierbas y suplementos. Estudios Pueden hacerle un examen o estudios, tales como: Radiografas de la vejiga, la uretra o los riones. Exploracin por tomografa computarizada (TC) del abdomen o la pelvis. Anlisis de orina para verificar si hay signos de infeccin. Instrucciones generales Siga las instrucciones del mdico respecto de las restricciones de comidas o bebidas. Pregntele al mdico qu medidas se tomarn para ayudar a prevenir una infeccin. Estas medidas pueden incluir las siguientes: Lavar la piel con un jabn antisptico. Recibir antibiticos. Haga que un adulto responsable lo lleve a su casa desde el hospital o la clnica. Qu ocurre durante el procedimiento?  Le administrarn uno o ms de los siguientes medicamentos: Un medicamento para ayudar a Nurse, children's (sedante). Un medicamento para adormecer la zona (anestesia local). Se limpiar la zona que se encuentra alrededor de la abertura de Geologist, engineering. El cistoscopio se introducir por la uretra e ingresar a la vejiga. Un lquido estril fluir por el cistoscopio y Recruitment consultant vejiga. El lquido Building services engineer vejiga para que el mdico pueda examinar claramente las paredes de la vejiga. El mdico examinar la uretra y la vejiga. El mdico puede tomar una biopsia o extraer clculos. El cistoscopio se retirar y Hydrographic surveyor. El procedimiento puede variar segn el mdico y el hospital. Qu puedo esperar despus del procedimiento? Despus del procedimiento, es normal tener los siguientes sntomas: Algo de dolor o molestias en el abdomen y Retail buyer. Sntomas urinarios. Estos  incluyen los siguientes: Dolor o ardor leves al Garment/textile technologist. El dolor debe ceder unos minutos despus de Garment/textile technologist. Esto puede durar 1 semana. Una pequea cantidad de sangre en la [employer]. Sentir que Scientist, clinical (histocompatibility and immunogenetics) produce solo una pequea cantidad [place]. Siga estas instrucciones en su casa: Medicamentos Use los medicamentos de venta libre y los recetados solamente como se lo haya indicado el mdico. Si le recetaron un antibitico, tmelo como se lo haya indicado el mdico. No deje de tomar el antibitico aunque comience a sentirse mejor. Instrucciones generales Retome sus actividades normales segn lo indicado por el mdico. Pregntele al mdico qu actividades son seguras para usted. Si le administraron un sedante durante el procedimiento, puede afectarlo por varias horas. No conduzca ni opere maquinaria hasta que el mdico le indique que es seguro [place]. Observe si hay sangre en la orina. Si la cantidad de [employer] orina aumenta, comunquese con el [employer] instrucciones del mdico respecto de las restricciones de comidas o bebidas. Si se tom [place] de tejido para anlisis (biopsia) durante el procedimiento, depende de usted [employer] de la prueba. Consulte al mdico o pregunte en el departamento donde se realiza la prueba cundo estarn [employer]. Beber suficiente lquido como para Theatre manager la orina de color amarillo plido. Cumpla con todas las visitas de seguimiento. Esto es importante. Comunquese con un mdico si: Tiene un dolor que empeora o que no mejora con los medicamentos, especialmente al Garment/textile technologist. Tiene problemas para orinar. Observa ms sangre en la orina. Solicite ayuda de inmediato si: Hay cogulos de [employer]. Siente dolor abdominal. Tiene fiebre o escalofros. No puede orinar. Resumen La cistoscopia es un procedimiento que se [place] para ayudar a Retail buyer y, a  Clinical cytogeneticist, tratar afecciones que afectan las vas urinarias inferiores. La cistoscopia se hace con un instrumento fino en forma de tubo con una luz y una cmara en el extremo. Despus del procedimiento, es comn tener algo de sensibilidad o dolor en el abdomen y Geologist, engineering. Observe si hay sangre en la orina. Si la cantidad de [employer] orina aumenta, comunquese con el mdico. Si le recetaron un antibitico, tmelo como se lo haya indicado el mdico. No deje de tomar el antibitico aunque comience a sentirse mejor. Esta informacin no tiene Marine scientist el consejo del mdico. Asegrese de hacerle al mdico cualquier pregunta que tenga. Document Revised: 02/15/2020 Document Reviewed: 02/15/2020 Elsevier Patient Education  2022 [employer].

## 2021-05-25 NOTE — Progress Notes (Signed)
05/25/21 10:07 AM   Shelly Freeman 22-May-1950 [id number]  CC: Recurrent pyelonephritis, bladder wall thickening, history of nephrolithiasis  HPI: 71 year old female here for the above issues.  Today's visit was conducted using a Patent attorney.  Briefly she presented to the ER in September 2022 and January 2023 after a confusion/fall.  She was diagnosed with UTIs and possible right-sided pyelonephritis both times, initially with Enterobacter, then with E. coli.  She does report some problems with urgency, frequency, dysuria, but difficult to ascertain if this has resolved with antibiotics, she states that it comes and goes.    CT scan at both visits at time of UTI showed significant right-sided perinephric stranding, but no definite hydronephrosis, and on the most recent CT there is significant asymmetric right-sided bladder wall thickening.  She is a non-smoker, denies any gross hematuria.  She has extensive history of nephrolithiasis including numerous lithotripsies, as well as a left-sided PCNL over 20 years ago.  Urinalysis today is pending  PMH: Past Medical History:  Diagnosis Date   Arthritis    Collagen vascular disease ([place])    COPD (chronic obstructive pulmonary disease) (HCC)    DDD (degenerative disc disease), lumbar 05/14/2016   Depression    Dizziness    Dyspnea    GERD (gastroesophageal reflux disease)    Heart valve problem    per grandaughter need surgery but they told her may not survive;   History of kidney stones    Hypertension    Hypothyroidism    Lupus ([place])    Neuromuscular disorder ([place])    Sleep apnea    no CPAP   Thyroid disease    Vitamin D deficiency 05/14/2016    Surgical History: Past Surgical History:  Procedure Laterality Date   APPENDECTOMY     BREAST BIOPSY Left    neg   CESAREAN SECTION     COLONOSCOPY WITH PROPOFOL N/A 07/01/2017   Procedure: COLONOSCOPY WITH PROPOFOL;  Surgeon: Manya Silvas, MD;  [facility]  ENDOSCOPY;  Service: Endoscopy;  Laterality: N/A;   ESOPHAGOGASTRODUODENOSCOPY (EGD) WITH PROPOFOL N/A 07/01/2017   Procedure: ESOPHAGOGASTRODUODENOSCOPY (EGD) WITH PROPOFOL;  Surgeon: Manya Silvas, MD;  Location: [facility] ENDOSCOPY;  Service: Endoscopy;  Laterality: N/A;   GASTRIC BYPASS     LITHOTRIPSY     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 She has never used smokeless tobacco. She reports that she does not drink alcohol and does not use drugs.  Physical Exam: BP 132/68 (BP Location: Left Arm, Patient Position: Sitting, Cuff Size: Large)    Pulse 68    Ht 5\' 1"  (1.549 m)    Wt 218 lb 4.8 oz (99 kg)    BMI 41.25 kg/m    Constitutional:  Alert and oriented, No acute distress. Cardiovascular: No clubbing, cyanosis, or edema. Respiratory: Normal respiratory effort, no increased work of breathing. GI: Abdomen is soft, nontender, nondistended, no abdominal masses   Laboratory Data: Reviewed, see HPI  Pertinent Imaging: I have personally viewed and interpreted the CT scans from September 2022 and 05/17/2021.  There is moderate asymmetric right-sided bladder wall thickening on the most  recent CT, unclear if related to cystitis versus malignancy.  Stable right-sided perinephric stranding on both scans, no delayed images.  Assessment & Plan:   71 year old female with confusion/fall in September 2022 and January 2023 likely related to UTI/right-sided pyelonephritis.  Both CT scans show right-sided perinephric stranding of unclear etiology, and the most recent CT shows significant asymmetric right-sided bladder wall thickening that I think warrants further evaluation  with cystoscopy.  We discussed possible etiology including recurrent UTI/cystitis, pyelonephritis, bladder cancer/CIS.  Follow-up urinalysis today, will send for culture and atypicals  Follow-up for cystoscopy and cytology in 2 to 3 weeks, may warrant right retrograde pyelogram or diagnostic right ureteroscopy in the future   Nickolas Madrid, MD 05/25/2021  [facility] [address], [place], [place] [address] [phone]

## 2021-05-29 LAB — CULTURE, URINE COMPREHENSIVE

## 2021-06-05 LAB — MYCOPLASMA / UREAPLASMA CULTURE
Mycoplasma hominis Culture: NEGATIVE
Ureaplasma urealyticum: NEGATIVE

## 2021-06-08 ENCOUNTER — Ambulatory Visit (INDEPENDENT_AMBULATORY_CARE_PROVIDER_SITE_OTHER): Payer: Medicare (Managed Care) | Admitting: Urology

## 2021-06-08 ENCOUNTER — Other Ambulatory Visit: Payer: Self-pay

## 2021-06-08 ENCOUNTER — Encounter: Payer: Self-pay | Admitting: Urology

## 2021-06-08 VITALS — BP 151/82 | HR 66 | Ht 61.0 in | Wt 218.0 lb

## 2021-06-08 DIAGNOSIS — R9389 Abnormal findings on diagnostic imaging of other specified body structures: Secondary | ICD-10-CM

## 2021-06-08 DIAGNOSIS — R300 Dysuria: Secondary | ICD-10-CM

## 2021-06-08 MED ORDER — CEPHALEXIN 250 MG PO CAPS
500.0000 mg | ORAL_CAPSULE | Freq: Once | ORAL | Status: AC
  Administered 2021-06-08: 500 mg via ORAL

## 2021-06-08 NOTE — Progress Notes (Signed)
Cystoscopy Procedure Note:  Indication: Bladder wall thickening on CT  After informed consent and discussion of the procedure and its risks, Shelly Freeman was positioned and prepped in the standard fashion. Cystoscopy was performed with a flexible cystoscope. The urethra, bladder neck and entire bladder was visualized in a standard fashion. The ureteral orifices were visualized in their normal location and orientation.  The bladder mucosa was grossly normal throughout, no abnormalities on retroflexion.  Cytology sent.  Assessment and Plan: Call with cytology results, follow-up with urology as needed if normal  Nickolas Madrid, MD 06/08/2021

## 2021-06-12 LAB — CYTOLOGY - NON PAP

## 2021-06-13 ENCOUNTER — Telehealth: Payer: Self-pay

## 2021-06-13 NOTE — Telephone Encounter (Signed)
-----   Message from Billey Co, MD sent at 06/13/2021  7:57 AM EST ----- Good news, no cancer cells on urine specimen.  Can follow-up with urology as needed  Nickolas Madrid, MD 06/13/2021

## 2021-06-13 NOTE — Telephone Encounter (Signed)
Called pt informed her of the information below. Pt voiced understanding.  

## 2021-08-23 ENCOUNTER — Encounter: Payer: Self-pay | Admitting: Gastroenterology

## 2021-08-23 NOTE — H&P (Signed)
? ?Pre-Procedure H&P ?  ?Patient ID: Shelly Freeman is a 71 y.o. female. ? ?Gastroenterology Provider: [name], DO ? ?Referring Provider: Laurine Blazer, PA ?PCP: Janna Arch, MD ? ?Date: 08/24/2021 ? ?HPI ?Shelly Freeman is a 71 y.o. female who presents today for Colonoscopy for Persistent diarrhea, personal history of colon polyps. ?Patient with reported history of C. difficile colitis status post vancomycin and Dificid reportedly without response.  She was also diagnosed with norovirus simultaneously.  She tested positive for C. difficile toxin however no white count abdominal pain and diarrhea discontinued.  Has noted some improvement in bowel movements with Questran. Overall she ahs greatly improved. ? ? She also notes as of late worsening right upper quadrant pain with eating.  Overall her appetite has been good and she has not lost any weight. ? ?Colonoscopy last performed 2019 with internal hemorrhoids and biopsies were negative for microscopic colitis.  Fair visualization.  Patient self-reports polyps in 2010 colonoscopy.  She is status post gastric bypass.  Normal EGD in 2019. ? ?Hemoglobin 14.9 MCV 97 platelets 159,000 ? ?Past Medical History:  ?Diagnosis Date  ? Arthritis   ? Collagen vascular disease ([place])   ? COPD (chronic obstructive pulmonary disease) ([place])   ? DDD (degenerative disc disease), lumbar 05/14/2016  ? Depression   ? Dizziness   ? Dyspnea   ? GERD (gastroesophageal reflux disease)   ? Heart valve problem   ? per grandaughter need surgery but they told her may not survive;  ? History of kidney stones   ? Hypertension   ? Hypothyroidism   ? Lupus ([place])   ? Neuromuscular disorder ([place])   ? Sleep apnea   ? no CPAP  ? Thyroid disease   ? Vitamin D deficiency 05/14/2016  ? ? ?Past Surgical History:  ?Procedure Laterality Date  ? APPENDECTOMY    ? BREAST BIOPSY Left   ? neg  ? CESAREAN SECTION    ? COLONOSCOPY WITH PROPOFOL N/A 07/01/2017  ? Procedure: COLONOSCOPY WITH  PROPOFOL;  Surgeon: Manya Silvas, MD;  Location[facility] ENDOSCOPY;  Service: Endoscopy;  Laterality: N/A;  ? ESOPHAGOGASTRODUODENOSCOPY (EGD) WITH PROPOFOL N/A 07/01/2017  ? Procedure: ESOPHAGOGASTRODUODENOSCOPY (EGD) WITH PROPOFOL;  Surgeon: Manya Silvas, MD;  [facility] ENDOSCOPY;  Service: Endoscopy;  Laterality: N/A;  ? GASTRIC BYPASS    ? LITHOTRIPSY    ? 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 Procedure: TOTAL KNEE ARTHROPLASTY;  Surgeon: Hessie Knows, MD;  Location: [facility] ORS;  Service: Orthopedics;  Laterality: Left;  ? TUBAL LIGATION    ? ? ?Family History ?No h/o GI disease or malignancy ? ?Review of Systems  ?Constitutional:  Negative for activity change, appetite change, chills, diaphoresis, fatigue, fever and unexpected weight change.  ?HENT:  Negative for trouble swallowing and voice change.   ?Respiratory:  Negative for shortness of breath and wheezing.   ?Cardiovascular:  Negative for chest pain, palpitations and leg swelling.  ?Gastrointestinal:  Positive for diarrhea. Negative for abdominal distention, abdominal pain, anal bleeding, blood in stool, constipation, nausea, rectal pain and vomiting.  ?Musculoskeletal:  Negative for arthralgias and myalgias.  ?Skin:  Negative for color change and pallor.  ?Neurological:  Negative for dizziness, syncope and weakness.  ?Psychiatric/Behavioral:  Negative for confusion.   ?All other systems reviewed and are negative.  ? ?Medications ?No current facility-administered medications on file prior to encounter.  ? ?Current Outpatient Medications on File Prior to Encounter  ?Medication Sig Dispense Refill  ?  busPIRone (BUSPAR) 5 MG tablet Take 5 mg by mouth 2 (two) times daily.    ? cholestyramine (QUESTRAN) 4 GM/DOSE powder Take by mouth 3 (three) times daily with meals.    ? escitalopram (LEXAPRO) 20 MG tablet Take 20 mg by mouth daily.    ? famotidine (PEPCID) 20 MG tablet Take 20 mg by mouth 2 (two) times daily.    ? ferrous sulfate 325 (65 FE) MG  tablet Take 325 mg by mouth every Monday, Wednesday, and Friday.    ? folic acid (FOLVITE) 1 MG tablet Take 1 mg by mouth daily.    ? gabapentin (NEURONTIN) 600 MG tablet Take by mouth.    ? levothyroxine (SYNTHROID, LEVOTHROID) 75 MCG tablet Take 75 mcg by mouth daily before breakfast.    ? metoprolol succinate (TOPROL-XL) 25 MG 24 hr tablet Take 25 mg by mouth daily.     ? acetaminophen (TYLENOL) 325 MG tablet Take 650 mg by mouth 3 (three) times daily as needed for mild pain. Do not take more than 4 tablets in a day if you take Hydrocodone-Apap too.    ? albuterol (VENTOLIN HFA) 108 (90 Base) MCG/ACT inhaler Inhale 2 puffs into the lungs every 4 (four) hours as needed for wheezing or shortness of breath.    ? alendronate (FOSAMAX) 70 MG tablet Take 70 mg by mouth once a week. Take with a full glass of water on an empty stomach.    ? busPIRone (BUSPAR) 7.5 MG tablet Take 7.5 mg by mouth 2 (two) times daily.    ? docusate sodium (COLACE) 100 MG capsule Take 1 capsule (100 mg total) by mouth 2 (two) times daily. 10 capsule 0  ? hydrocortisone cream 1 % Apply 1 application topically 3 (three) times daily as needed for itching.    ? ibuprofen (ADVIL) 400 MG tablet Take by mouth.    ? ipratropium-albuterol (DUONEB) 0.5-2.5 (3) MG/3ML SOLN Take 3 mLs by nebulization every 6 (six) hours as needed (shortness of breath).    ? leflunomide (ARAVA) 10 MG tablet Take 10 mg by mouth daily.    ? lidocaine (LIDODERM) 5 % Place 1 patch onto the skin daily. Remove & Discard patch within 12 hours or as directed by MD    ? loperamide (IMODIUM) 2 MG capsule Take 2 mg by mouth daily at 12 noon. For Diarrhea    ? meloxicam (MOBIC) 15 MG tablet Take 15 mg by mouth daily.    ? NYSTATIN powder SMARTSIG:2 Topical Twice Daily    ? OLANZapine (ZYPREXA) 2.5 MG tablet Take 2.5 mg by mouth at bedtime.    ? OLANZapine (ZYPREXA) 5 MG tablet Take 5 mg by mouth at bedtime.    ? oxybutynin (DITROPAN-XL) 5 MG 24 hr tablet Take 5 mg by mouth daily.     ? predniSONE (DELTASONE) 20 MG tablet Take 20 mg by mouth as needed (asthma attack).    ? traZODone (DESYREL) 100 MG tablet Take 125 mg by mouth at bedtime.    ? traZODone (DESYREL) 50 MG tablet Take 25 mg by mouth at bedtime as needed for sleep.    ? Vitamin D, Ergocalciferol, (DRISDOL) 1.25 MG (50000 UNIT) CAPS capsule Take 50,000 Units by mouth every 7 (seven) days.    ? ? ?Pertinent medications related to GI and procedure were reviewed by me with the patient prior to the procedure ? ?No current facility-administered medications for this encounter. ?  ?  ? ?No Known Allergies ?Allergies were  reviewed by me prior to the procedure ? ?Objective  ? ?Body mass index is 41.38 kg/m?. ?Vitals:  ? 08/24/21 1245  ?BP: 128/68  ?Pulse: (!) 59  ?Resp: 16  ?Temp: 97.6 ?F (36.4 ?C)  ?TempSrc: Temporal  ?SpO2: 99%  ?Weight: 99.3 kg  ?Height: '5\' 1"'$  (1.549 m)  ? ? ? ?Physical Exam ?Vitals and nursing note reviewed.  ?Constitutional:   ?   General: She is not in acute distress. ?   Appearance: Normal appearance. She is obese. She is not ill-appearing, toxic-appearing or diaphoretic.  ?HENT:  ?   Head: Normocephalic and atraumatic.  ?   Nose: Nose normal.  ?   Mouth/Throat:  ?   Mouth: Mucous membranes are moist.  ?   Pharynx: Oropharynx is clear.  ?Eyes:  ?   General: No scleral icterus. ?   Extraocular Movements: Extraocular movements intact.  ?Cardiovascular:  ?   Rate and Rhythm: Regular rhythm. Bradycardia present.  ?   Heart sounds: Normal heart sounds. No murmur heard. ?  No friction rub. No gallop.  ?Pulmonary:  ?   Effort: Pulmonary effort is normal. No respiratory distress.  ?   Breath sounds: Normal breath sounds. No wheezing, rhonchi or rales.  ?Abdominal:  ?   General: Bowel sounds are normal. There is no distension.  ?   Palpations: Abdomen is soft.  ?   Tenderness: There is no abdominal tenderness. There is no guarding or rebound.  ?Musculoskeletal:  ?   Cervical back: Neck supple.  ?   Right lower leg: No edema.  ?    Left lower leg: No edema.  ?Skin: ?   General: Skin is warm and dry.  ?   Coloration: Skin is not jaundiced or pale.  ?Neurological:  ?   General: No focal deficit present.  ?   Mental Status: She i

## 2021-08-24 ENCOUNTER — Encounter: Payer: Self-pay | Admitting: Gastroenterology

## 2021-08-24 ENCOUNTER — Ambulatory Visit: Payer: Medicare (Managed Care) | Admitting: Anesthesiology

## 2021-08-24 ENCOUNTER — Ambulatory Visit
Admission: RE | Admit: 2021-08-24 | Discharge: 2021-08-24 | Disposition: A | Discharge status: Home / Self Care | Payer: Medicare (Managed Care) | Attending: Gastroenterology | Admitting: Gastroenterology

## 2021-08-24 ENCOUNTER — Encounter
Admission: RE | Disposition: A | Discharge status: Home / Self Care | Payer: Self-pay | Source: Home / Self Care | Attending: Gastroenterology

## 2021-08-24 DIAGNOSIS — J449 Chronic obstructive pulmonary disease, unspecified: Secondary | ICD-10-CM | POA: Diagnosis not present

## 2021-08-24 DIAGNOSIS — E039 Hypothyroidism, unspecified: Secondary | ICD-10-CM | POA: Insufficient documentation

## 2021-08-24 DIAGNOSIS — K529 Noninfective gastroenteritis and colitis, unspecified: Secondary | ICD-10-CM | POA: Insufficient documentation

## 2021-08-24 DIAGNOSIS — G4730 Sleep apnea, unspecified: Secondary | ICD-10-CM | POA: Diagnosis not present

## 2021-08-24 DIAGNOSIS — G709 Myoneural disorder, unspecified: Secondary | ICD-10-CM | POA: Diagnosis not present

## 2021-08-24 DIAGNOSIS — F419 Anxiety disorder, unspecified: Secondary | ICD-10-CM | POA: Diagnosis not present

## 2021-08-24 DIAGNOSIS — Z6841 Body Mass Index (BMI) 40.0 and over, adult: Secondary | ICD-10-CM | POA: Insufficient documentation

## 2021-08-24 DIAGNOSIS — K219 Gastro-esophageal reflux disease without esophagitis: Secondary | ICD-10-CM | POA: Diagnosis not present

## 2021-08-24 DIAGNOSIS — K635 Polyp of colon: Secondary | ICD-10-CM | POA: Diagnosis not present

## 2021-08-24 DIAGNOSIS — F32A Depression, unspecified: Secondary | ICD-10-CM | POA: Insufficient documentation

## 2021-08-24 DIAGNOSIS — Z8619 Personal history of other infectious and parasitic diseases: Secondary | ICD-10-CM | POA: Insufficient documentation

## 2021-08-24 DIAGNOSIS — I10 Essential (primary) hypertension: Secondary | ICD-10-CM | POA: Insufficient documentation

## 2021-08-24 DIAGNOSIS — Z9884 Bariatric surgery status: Secondary | ICD-10-CM | POA: Insufficient documentation

## 2021-08-24 DIAGNOSIS — K640 First degree hemorrhoids: Secondary | ICD-10-CM | POA: Diagnosis not present

## 2021-08-24 HISTORY — PX: COLONOSCOPY: SHX5424

## 2021-08-24 SURGERY — COLONOSCOPY
Anesthesia: General

## 2021-08-24 MED ORDER — PROPOFOL 10 MG/ML IV BOLUS
INTRAVENOUS | Status: DC | PRN
  Administered 2021-08-24: 100 mg via INTRAVENOUS

## 2021-08-24 MED ORDER — SODIUM CHLORIDE 0.9 % IV SOLN: INTRAVENOUS | Status: DC

## 2021-08-24 MED ORDER — PROPOFOL 500 MG/50ML IV EMUL
INTRAVENOUS | Status: DC | PRN
  Administered 2021-08-24: 175 ug/kg/min via INTRAVENOUS

## 2021-08-24 MED ORDER — PHENYLEPHRINE 80 MCG/ML (10ML) SYRINGE FOR IV PUSH (FOR BLOOD PRESSURE SUPPORT)
PREFILLED_SYRINGE | INTRAVENOUS | Status: AC
  Filled 2021-08-24: qty 10

## 2021-08-24 MED ORDER — PROPOFOL 500 MG/50ML IV EMUL
INTRAVENOUS | Status: AC
  Filled 2021-08-24: qty 50

## 2021-08-24 NOTE — Transfer of Care (Signed)
Immediate Anesthesia Transfer of Care Note ? ?Patient: Shelly Freeman ? ?Procedure(s) Performed: COLONOSCOPY ? ?Patient Location: Endoscopy Unit ? ?Anesthesia Type:General ? ?Level of Consciousness: awake ? ?Airway & Oxygen Therapy: Patient Spontanous Breathing ? ?Post-op Assessment: Report given to RN and Post -op Vital signs reviewed and stable ? ?Post vital signs: Reviewed and stable ? ?Last Vitals:  ?Vitals Value Taken Time  ?BP 128/84 08/24/21 1430  ?Temp    ?Pulse 56 08/24/21 1430  ?Resp 14 08/24/21 1430  ?SpO2 98 % 08/24/21 1430  ? ? ?Last Pain:  ?Vitals:  ? 08/24/21 1427  ?TempSrc:   ?PainSc: 0-No pain  ?   ? ?  ? ?Complications: No notable events documented. ?

## 2021-08-24 NOTE — Op Note (Signed)
[facility] ?Gastroenterology ?Patient Name: Shelly Freeman ?Procedure Date: 08/24/2021 1:54 PM ?MRN: [id number] ?Account #: [id number] ?Date of Birth: 01-18-1951 ?Admit Type: Outpatient ?Age: 71 ?Room: [facility] ENDO ROOM 1 ?Gender: Female ?Note Status: Finalized ?Instrument Name: Colonoscope [id number] ?Procedure:             Colonoscopy ?Indications:           Chronic diarrhea ?Providers:             [name] DO, DO ?Referring MD:          Benjiman Core, MD (Referring MD) ?Medicines:             Monitored Anesthesia Care ?Complications:         No immediate complications. Estimated blood loss:  ?                       Minimal. ?Procedure:             Pre-Anesthesia Assessment: ?                       - Prior to the procedure, a History and Physical was  ?                       performed, and patient medications and allergies were  ?                       reviewed. The patient is competent. The risks and  ?                       benefits of the procedure and the sedation options and  ?                       risks were discussed with the patient. All questions  ?                       were answered and informed consent was obtained.  ?                       Patient identification and proposed procedure were  ?                       verified by the physician, the nurse, the anesthetist  ?                       and the technician in the endoscopy suite. Mental  ?                       Status Examination: alert and oriented. Airway  ?                       Examination: normal oropharyngeal airway and neck  ?                       mobility. Respiratory Examination: clear to  ?                       auscultation. CV Examination: RRR, no murmurs, no S3  ?  or S4. Prophylactic Antibiotics: The patient does not  ?                       require prophylactic antibiotics. Prior  ?                       Anticoagulants: The patient has taken no previous  ?                        anticoagulant or antiplatelet agents. ASA Grade  ?                       Assessment: III - A patient with severe systemic  ?                       disease. After reviewing the risks and benefits, the  ?                       patient was deemed in satisfactory condition to  ?                       undergo the procedure. The anesthesia plan was to use  ?                       monitored anesthesia care (MAC). Immediately prior to  ?                       administration of medications, the patient was  ?                       re-assessed for adequacy to receive sedatives. The  ?                       heart rate, respiratory rate, oxygen saturations,  ?                       blood pressure, adequacy of pulmonary ventilation, and  ?                       response to care were monitored throughout the  ?                       procedure. The physical status of the patient was  ?                       re-assessed after the procedure. ?                       After obtaining informed consent, the colonoscope was  ?                       passed under direct vision. Throughout the procedure,  ?                       the patient's blood pressure, pulse, and oxygen  ?                       saturations were monitored continuously. The  ?  Colonoscope was introduced through the anus and  ?                       advanced to the the terminal ileum, with  ?                       identification of the appendiceal orifice and IC  ?                       valve. The colonoscopy was performed without  ?                       difficulty. The patient tolerated the procedure well.  ?                       The quality of the bowel preparation was evaluated  ?                       using the BBPS [facility] Bowel Preparation Scale) with  ?                       scores of: Right Colon = 3, Transverse Colon = 3 and  ?                       Left Colon = 3 (entire mucosa seen well with no  ?                       residual staining,  small fragments of stool or opaque  ?                       liquid). The total BBPS score equals 9. The terminal  ?                       ileum, ileocecal valve, appendiceal orifice, and  ?                       rectum were photographed. ?Findings: ?     The perianal and digital rectal examinations were normal. Pertinent  ?     negatives include normal sphincter tone. ?     The terminal ileum appeared normal. Estimated blood loss: none. ?     Non-bleeding internal hemorrhoids were found during retroflexion. The  ?     hemorrhoids were Grade I (internal hemorrhoids that do not prolapse).  ?     Estimated blood loss: none. ?     Normal mucosa was found in the entire colon. Biopsies for histology were  ?     taken with a cold forceps from the right colon and left colon for  ?     evaluation of microscopic colitis. Estimated blood loss was minimal. ?     A 1 to 2 mm polyp was found in the transverse colon. The polyp was  ?     sessile. The polyp was removed with a cold biopsy forceps. Resection and  ?     retrieval were complete. Estimated blood loss was minimal. ?     The exam was otherwise without abnormality on direct and retroflexion  ?     views. ?Impression:            -  The examined portion of the ileum was normal. ?                       - Non-bleeding internal hemorrhoids. ?                       - Normal mucosa in the entire examined colon. Biopsied. ?                       - One 1 to 2 mm polyp in the transverse colon, removed  ?                       with a cold biopsy forceps. Resected and retrieved. ?                       - The examination was otherwise normal on direct and  ?                       retroflexion views. ?Recommendation:        - Discharge patient to home. ?                       - Resume previous diet. ?                       - Continue present medications. ?                       - Await pathology results. ?                       - Repeat colonoscopy for surveillance based on  ?                        pathology results. ?                       - Return to GI office as previously scheduled. ?                       - The findings and recommendations were discussed with  ?                       the patient via medical interpreter. ?Procedure Code(s):     --- Professional --- ?                       [phone], Colonoscopy, flexible; with biopsy, single or  ?                       multiple ?Diagnosis Code(s):     --- Professional --- ?                       K64.0, First degree hemorrhoids ?                       K63.5, Polyp of colon ?                       K52.9, Noninfective gastroenteritis and colitis,  ?  unspecified ?CPT copyright 2019 American Medical Association. All rights reserved. ?The codes documented in this report are preliminary and upon coder review may  ?be revised to meet current compliance requirements. ?Attending Participation: ?     I personally performed the entire procedure. ?Volney American, DO ?[name] DO, DO ?08/24/2021 2:30:22 PM ?This report has been signed electronically. ?Number of Addenda: 0 ?Note Initiated On: 08/24/2021 1:54 PM ?Scope Withdrawal Time: 0 hours 12 minutes 24 seconds  ?Total Procedure Duration: 0 hours 21 minutes 18 seconds  ?Estimated Blood Loss:  Estimated blood loss was minimal. ?     [facility] ?

## 2021-08-24 NOTE — Anesthesia Preprocedure Evaluation (Signed)
Anesthesia Evaluation  ?Patient identified by MRN, date of birth, ID band ?Patient awake ? ?General Assessment Comment:Patient appears to be poor historian and denies many of the medical issues that are listed in her chart ? ?Patient says she had a problem with either spinal or epidural anesthesia 15 years ago after bariatric surgery; she says she had prolonged back pain, headaches, and bruising at the site ? ?Reviewed: ?Allergy & Precautions, NPO status , Patient's Chart, lab work & pertinent test results ? ?History of Anesthesia Complications ?(+) POST - OP SPINAL HEADACHE and history of anesthetic complications ? ?Airway ?Mallampati: I ? ?TM Distance: >3 FB ?Neck ROM: Full ? ? ? Dental ? ?(+) Poor Dentition, Missing, Dental Advidsory Given, Caps ?  ?Pulmonary ?neg shortness of breath, sleep apnea , COPD, neg recent URI, Patient abstained from smoking.Not current smoker,  ?  ?Pulmonary exam normal ?breath sounds clear to auscultation ? ? ? ? ? ? Cardiovascular ?Exercise Tolerance: Good ?METShypertension, (-) angina(-) CAD and (-) Past MI (-) dysrhythmias  ?Rhythm:Regular Rate:Normal ?- Systolic murmurs ?TTE 2021: ?INTERPRETATION  ?NORMAL LEFT VENTRICULAR SYSTOLIC FUNCTION ? WITH MILD LVH  ?NORMAL RIGHT VENTRICULAR SYSTOLIC FUNCTION  ?MILD VALVULAR REGURGITATION (See above)  ?NO VALVULAR STENOSIS  ?MILD MR, TR  ?EF 55-60%  ? ?  ?Neuro/Psych ?PSYCHIATRIC DISORDERS Anxiety Depression  Neuromuscular disease   ? GI/Hepatic ?Neg liver ROS, GERD  ,(+)  ?  ? (-) substance abuse ? ,   ?Endo/Other  ?neg diabetesHypothyroidism Morbid obesity ? Renal/GU ?negative Renal ROS  ? ?  ?Musculoskeletal ? ? Abdominal ?(+) + obese,   ?Peds ? Hematology ?  ?Anesthesia Other Findings ?Past Medical History: ?No date: Arthritis ?No date: Collagen vascular disease ([place]) ?No date: COPD (chronic obstructive pulmonary disease) ([place]) ?05/14/2016: DDD (degenerative disc disease), lumbar ?No date: Depression ?No  date: Dizziness ?No date: Dyspnea ?No date: GERD (gastroesophageal reflux disease) ?No date: Heart valve problem ?    Comment:  per grandaughter need surgery but they told her may not  ?             survive; ?No date: History of kidney stones ?No date: Hypertension ?No date: Hypothyroidism ?No date: Lupus ([place]) ?No date: Neuromuscular disorder ([place]) ?No date: Sleep apnea ?    Comment:  no CPAP ?No date: Thyroid disease ?05/14/2016: Vitamin D deficiency ? Reproductive/Obstetrics ? ?  ? ? ? ? ? ? ? ? ? ? ? ? ? ?  ?  ? ? ? ? ? ? ? ? ?Anesthesia Physical ? ?Anesthesia Plan ? ?ASA: 3 ? ?Anesthesia Plan: General  ? ?Post-op Pain Management:   ? ?Induction: Intravenous ? ?PONV Risk Score and Plan: 4 or greater and Propofol infusion and TIVA ? ?Airway Management Planned: Natural Airway and Nasal Cannula ? ?Additional Equipment: None ? ?Intra-op Plan:  ? ?Post-operative Plan:  ? ?Informed Consent: I have reviewed the patients History and Physical, chart, labs and discussed the procedure including the risks, benefits and alternatives for the proposed anesthesia with the patient or authorized representative who has indicated his/her understanding and acceptance.  ? ? ? ?Dental advisory given and Interpreter used for interveiw (in-person spanish interpreter at bedside) ? ?Plan Discussed with: CRNA and Surgeon ? ?Anesthesia Plan Comments:   ? ? ? ? ? ? ?Anesthesia Quick Evaluation ? ?

## 2021-08-24 NOTE — Interval H&P Note (Signed)
History and Physical Interval Note: Preprocedure H&P from 08/24/21 ? was reviewed and there was no interval change after seeing and examining the patient.  Written consent was obtained from the patient after discussion of risks, benefits, and alternatives. Patient has consented to proceed with Colonoscopy with possible intervention ? ? ?08/24/2021 ?12:59 PM ? ?Shelly Freeman  has presented today for surgery, with the diagnosis of Diarrhea, unspecified type (R19.7).  The various methods of treatment have been discussed with the patient and family. After consideration of risks, benefits and other options for treatment, the patient has consented to  Procedure(s) with comments: ?COLONOSCOPY (N/A) - SPANISH INTERPRETER as a surgical intervention.  The patient's history has been reviewed, patient examined, no change in status, stable for surgery.  I have reviewed the patient's chart and labs.  Questions were answered to the patient's satisfaction.   ? ? ?[name] ? ? ?

## 2021-08-25 ENCOUNTER — Encounter: Payer: Self-pay | Admitting: Gastroenterology

## 2021-08-25 NOTE — Anesthesia Postprocedure Evaluation (Signed)
Anesthesia Post Note ? ?Patient: Shelly Freeman ? ?Procedure(s) Performed: COLONOSCOPY ? ?Patient location during evaluation: Endoscopy ?Anesthesia Type: General ?Level of consciousness: awake and alert ?Pain management: pain level controlled ?Vital Signs Assessment: post-procedure vital signs reviewed and stable ?Respiratory status: spontaneous breathing, nonlabored ventilation, respiratory function stable and patient connected to nasal cannula oxygen ?Cardiovascular status: blood pressure returned to baseline and stable ?Postop Assessment: no apparent nausea or vomiting ?Anesthetic complications: no ? ? ?No notable events documented. ? ? ?Last Vitals:  ?Vitals:  ? 08/24/21 1437 08/24/21 1457  ?BP: 130/87 (!) 153/75  ?Pulse:  (!) 59  ?Resp:  18  ?Temp:    ?SpO2:  100%  ?  ?Last Pain:  ?Vitals:  ? 08/25/21 0729  ?TempSrc:   ?PainSc: 0-No pain  ? ? ?  ?  ?  ?  ?  ?  ? ?Martha Clan ? ? ? ? ?

## 2021-08-28 LAB — SURGICAL PATHOLOGY

## 2021-10-31 ENCOUNTER — Encounter: Payer: Self-pay | Admitting: Ophthalmology

## 2021-11-04 DIAGNOSIS — Z9841 Cataract extraction status, right eye: Secondary | ICD-10-CM

## 2021-11-04 HISTORY — DX: Cataract extraction status, right eye: Z98.41

## 2021-11-09 NOTE — Discharge Instructions (Signed)

## 2021-11-13 ENCOUNTER — Other Ambulatory Visit: Payer: Self-pay

## 2021-11-13 ENCOUNTER — Ambulatory Visit
Admission: RE | Admit: 2021-11-13 | Discharge: 2021-11-13 | Disposition: A | Discharge status: Home / Self Care | Payer: Medicare (Managed Care) | Source: Ambulatory Visit | Attending: Ophthalmology | Admitting: Ophthalmology

## 2021-11-13 ENCOUNTER — Encounter: Payer: Self-pay | Admitting: Ophthalmology

## 2021-11-13 ENCOUNTER — Ambulatory Visit: Payer: Medicare (Managed Care) | Admitting: Anesthesiology

## 2021-11-13 ENCOUNTER — Encounter
Admission: RE | Disposition: A | Discharge status: Home / Self Care | Payer: Self-pay | Source: Ambulatory Visit | Attending: Ophthalmology

## 2021-11-13 DIAGNOSIS — H2512 Age-related nuclear cataract, left eye: Secondary | ICD-10-CM | POA: Insufficient documentation

## 2021-11-13 DIAGNOSIS — E039 Hypothyroidism, unspecified: Secondary | ICD-10-CM | POA: Insufficient documentation

## 2021-11-13 DIAGNOSIS — G4730 Sleep apnea, unspecified: Secondary | ICD-10-CM | POA: Diagnosis not present

## 2021-11-13 DIAGNOSIS — K219 Gastro-esophageal reflux disease without esophagitis: Secondary | ICD-10-CM | POA: Diagnosis not present

## 2021-11-13 DIAGNOSIS — J449 Chronic obstructive pulmonary disease, unspecified: Secondary | ICD-10-CM | POA: Diagnosis not present

## 2021-11-13 DIAGNOSIS — I10 Essential (primary) hypertension: Secondary | ICD-10-CM | POA: Diagnosis not present

## 2021-11-13 HISTORY — PX: CATARACT EXTRACTION W/PHACO: SHX586

## 2021-11-13 SURGERY — PHACOEMULSIFICATION, CATARACT, WITH IOL INSERTION
Anesthesia: Monitor Anesthesia Care | Site: Eye | Laterality: Left

## 2021-11-13 MED ORDER — OXYCODONE HCL 5 MG/5ML PO SOLN: 5.0000 mg | Freq: Once | ORAL | Status: DC | PRN

## 2021-11-13 MED ORDER — TETRACAINE HCL 0.5 % OP SOLN
1.0000 [drp] | OPHTHALMIC | Status: DC | PRN
  Administered 2021-11-13 (×3): 1 [drp] via OPHTHALMIC

## 2021-11-13 MED ORDER — LACTATED RINGERS IV SOLN: INTRAVENOUS | Status: DC

## 2021-11-13 MED ORDER — MOXIFLOXACIN HCL 0.5 % OP SOLN
OPHTHALMIC | Status: DC | PRN
  Administered 2021-11-13: 0.2 mL via OPHTHALMIC

## 2021-11-13 MED ORDER — LIDOCAINE HCL (PF) 2 % IJ SOLN
INTRAOCULAR | Status: DC | PRN
  Administered 2021-11-13: 1 mL via INTRAOCULAR

## 2021-11-13 MED ORDER — FENTANYL CITRATE (PF) 100 MCG/2ML IJ SOLN
INTRAMUSCULAR | Status: DC | PRN
Start: 2021-11-13 — End: 2021-11-13
  Administered 2021-11-13: 50 ug via INTRAVENOUS

## 2021-11-13 MED ORDER — MIDAZOLAM HCL 2 MG/2ML IJ SOLN
INTRAMUSCULAR | Status: DC | PRN
  Administered 2021-11-13: 1 mg via INTRAVENOUS

## 2021-11-13 MED ORDER — ACETAMINOPHEN 325 MG PO TABS
650.0000 mg | ORAL_TABLET | Freq: Four times a day (QID) | ORAL | Status: DC | PRN
  Administered 2021-11-13: 650 mg via ORAL

## 2021-11-13 MED ORDER — SIGHTPATH DOSE#1 NA HYALUR & NA CHOND-NA HYALUR IO KIT
PACK | INTRAOCULAR | Status: DC | PRN
  Administered 2021-11-13: 1 via OPHTHALMIC

## 2021-11-13 MED ORDER — SIGHTPATH DOSE#1 BSS IO SOLN
INTRAOCULAR | Status: DC | PRN
  Administered 2021-11-13: 82 mL via OPHTHALMIC

## 2021-11-13 MED ORDER — ARMC OPHTHALMIC DILATING DROPS
1.0000 | OPHTHALMIC | Status: DC | PRN
  Administered 2021-11-13 (×3): 1 via OPHTHALMIC

## 2021-11-13 MED ORDER — SIGHTPATH DOSE#1 BSS IO SOLN
INTRAOCULAR | Status: DC | PRN
  Administered 2021-11-13: 15 mL

## 2021-11-13 MED ORDER — OXYCODONE HCL 5 MG PO TABS: 5.0000 mg | ORAL_TABLET | Freq: Once | ORAL | Status: DC | PRN

## 2021-11-13 MED ORDER — FENTANYL CITRATE PF 50 MCG/ML IJ SOSY: 25.0000 ug | PREFILLED_SYRINGE | INTRAMUSCULAR | Status: DC | PRN

## 2021-11-13 SURGICAL SUPPLY — 14 items
CATARACT SUITE SIGHTPATH (MISCELLANEOUS) ×2 IMPLANT
DISSECTOR HYDRO NUCLEUS 50X22 (MISCELLANEOUS) ×2 IMPLANT
FEE CATARACT SUITE SIGHTPATH (MISCELLANEOUS) ×1 IMPLANT
GLOVE SURG GAMMEX PI TX LF 7.5 (GLOVE) ×2 IMPLANT
GLOVE SURG SYN 8.5  E (GLOVE) ×1
GLOVE SURG SYN 8.5 E (GLOVE) ×1 IMPLANT
GLOVE SURG SYN 8.5 PF PI (GLOVE) ×1 IMPLANT
LENS IOL TECNIS EYHANCE 24.5 (Intraocular Lens) ×1 IMPLANT
NDL FILTER BLUNT 18X1 1/2 (NEEDLE) ×1 IMPLANT
NEEDLE FILTER BLUNT 18X 1/2SAF (NEEDLE) ×1
NEEDLE FILTER BLUNT 18X1 1/2 (NEEDLE) ×1 IMPLANT
SYR 3ML LL SCALE MARK (SYRINGE) ×2 IMPLANT
SYR 5ML LL (SYRINGE) ×2 IMPLANT
WATER STERILE IRR 250ML POUR (IV SOLUTION) ×2 IMPLANT

## 2021-11-13 NOTE — Op Note (Signed)
OPERATIVE NOTE  Shelly Freeman [id number] 11/13/2021   PREOPERATIVE DIAGNOSIS:  Nuclear sclerotic cataract left eye.  H25.12   POSTOPERATIVE DIAGNOSIS:    Nuclear sclerotic cataract left eye.     PROCEDURE:  Phacoemusification with posterior chamber intraocular lens placement of the left eye   LENS:   Implant Name Type Inv. Item Serial No. Manufacturer Lot No. LRB No. Used Action  LENS IOL TECNIS EYHANCE 24.5 - [id number] Intraocular Lens LENS IOL TECNIS EYHANCE 24.5 [id number] [employer]  Left 1 Implanted      Procedure(s): CATARACT EXTRACTION PHACO AND INTRAOCULAR LENS PLACEMENT (IOC) LEFT 3.63 00:35.9 (Left)  DIB00 +24.5   ULTRASOUND TIME: 0 minutes 35 seconds.  CDE 3.63   SURGEON:  Benay Pillow, MD, MPH   ANESTHESIA:  Topical with tetracaine drops augmented with 1% preservative-free intracameral lidocaine.  ESTIMATED BLOOD LOSS: <1 mL   COMPLICATIONS:  None.   DESCRIPTION OF PROCEDURE:  The patient was identified in the holding room and transported to the operating room and placed in the supine position under the operating microscope.  The left eye was identified as the operative eye and it was prepped and draped in the usual sterile ophthalmic fashion.   A 1.0 millimeter clear-corneal paracentesis was made at the 5:00 position. 0.5 ml of preservative-free 1% lidocaine with epinephrine was injected into the anterior chamber.  The anterior chamber was filled with viscoelastic.  A 2.4 millimeter keratome was used to make a near-clear corneal incision at the 2:00 position.  A curvilinear capsulorrhexis was made with a cystotome and capsulorrhexis forceps.  Balanced salt solution was used to hydrodissect and hydrodelineate the nucleus.   Phacoemulsification was then used in stop and chop fashion to remove the lens nucleus and epinucleus.  The remaining cortex was then removed using the irrigation and aspiration handpiece. Viscoelastic was then placed into the capsular bag to  distend it for lens placement.  A lens was then injected into the capsular bag.  The remaining viscoelastic was aspirated.   Wounds were hydrated with balanced salt solution.  The anterior chamber was inflated to a physiologic pressure with balanced salt solution.  Intracameral vigamox 0.1 mL undiltued was injected into the eye and a drop placed onto the ocular surface.  No wound leaks were noted.  The patient was taken to the recovery room in stable condition without complications of anesthesia or surgery  Benay Pillow 11/13/2021, 12:29 PM

## 2021-11-13 NOTE — Transfer of Care (Signed)
Immediate Anesthesia Transfer of Care Note  Patient: Shelly Freeman  Procedure(s) Performed: CATARACT EXTRACTION PHACO AND INTRAOCULAR LENS PLACEMENT (IOC) LEFT 3.63 00:35.9 (Left: Eye)  Patient Location: PACU  Anesthesia Type: MAC  Level of Consciousness: awake, alert  and patient cooperative  Airway and Oxygen Therapy: Patient Spontanous Breathing and Patient connected to supplemental oxygen  Post-op Assessment: Post-op Vital signs reviewed, Patient's Cardiovascular Status Stable, Respiratory Function Stable, Patent Airway and No signs of Nausea or vomiting  Post-op Vital Signs: Reviewed and stable  Complications: No notable events documented.

## 2021-11-13 NOTE — Anesthesia Preprocedure Evaluation (Signed)
Anesthesia Evaluation  Patient identified by MRN, date of birth, ID band Patient awake    Reviewed: Allergy & Precautions, H&P , NPO status , Patient's Chart, lab work & pertinent test results, reviewed documented beta blocker date and time   Airway Mallampati: II  TM Distance: >3 FB Neck ROM: full    Dental no notable dental hx.    Pulmonary shortness of breath, sleep apnea , pneumonia, COPD,    Pulmonary exam normal breath sounds clear to auscultation       Cardiovascular Exercise Tolerance: Good hypertension, negative cardio ROS   Rhythm:regular Rate:Normal     Neuro/Psych negative neurological ROS  negative psych ROS   GI/Hepatic Neg liver ROS, GERD  ,  Endo/Other  Hypothyroidism   Renal/GU Renal disease  negative genitourinary   Musculoskeletal   Abdominal   Peds  Hematology negative hematology ROS (+)   Anesthesia Other Findings   Reproductive/Obstetrics negative OB ROS                             Anesthesia Physical Anesthesia Plan  ASA: 2  Anesthesia Plan: MAC   Post-op Pain Management:    Induction:   PONV Risk Score and Plan: 2 and Treatment may vary due to age or medical condition and Midazolam  Airway Management Planned:   Additional Equipment:   Intra-op Plan:   Post-operative Plan:   Informed Consent: I have reviewed the patients History and Physical, chart, labs and discussed the procedure including the risks, benefits and alternatives for the proposed anesthesia with the patient or authorized representative who has indicated his/her understanding and acceptance.     Dental Advisory Given  Plan Discussed with: CRNA  Anesthesia Plan Comments:         Anesthesia Quick Evaluation

## 2021-11-13 NOTE — H&P (Signed)
[facility]   Primary Care Physician:  Janna Arch, MD Ophthalmologist: Dr. Benay Pillow  Pre-Procedure History & Physical: HPI:  Shelly Freeman is a 71 y.o. female here for cataract surgery.   Past Medical History:  Diagnosis Date   Arthritis    Collagen vascular disease ([place])    COPD (chronic obstructive pulmonary disease) (HCC)    DDD (degenerative disc disease), lumbar 05/14/2016   Depression    Dizziness    Dyspnea    GERD (gastroesophageal reflux disease)    Heart valve problem    per grandaughter need surgery but they told her may not survive;   History of kidney stones    Hypertension    Hypothyroidism    Lupus ([place])    Neuromuscular disorder ([place])    Sleep apnea    no CPAP   Thyroid disease    Vitamin D deficiency 05/14/2016    Past Surgical History:  Procedure Laterality Date   APPENDECTOMY     BREAST BIOPSY Left    neg   CESAREAN SECTION     COLONOSCOPY N/A 08/24/2021   Procedure: COLONOSCOPY;  Surgeon: [name], DO;  Location: [facility] ENDOSCOPY;  Service: Gastroenterology;  Laterality: N/A;  SPANISH INTERPRETER   COLONOSCOPY WITH PROPOFOL N/A 07/01/2017   Procedure: COLONOSCOPY WITH PROPOFOL;  Surgeon: Manya Silvas, MD;  Location: [facility] ENDOSCOPY;  Service: Endoscopy;  Laterality: N/A;   ESOPHAGOGASTRODUODENOSCOPY (EGD) WITH PROPOFOL N/A 07/01/2017   Procedure: ESOPHAGOGASTRODUODENOSCOPY (EGD) WITH PROPOFOL;  Surgeon: Manya Silvas, MD;  Location: [facility] ENDOSCOPY;  Service: Endoscopy;  Laterality: N/A;   GASTRIC BYPASS     LITHOTRIPSY     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   Procedure: TOTAL KNEE ARTHROPLASTY;  Surgeon: Hessie Knows, MD;  Location: [facility] ORS;  Service: Orthopedics;  Laterality: Left;   TUBAL LIGATION      Prior to Admission medications   Medication Sig Start Date End Date Taking? Authorizing Provider  acetaminophen (TYLENOL) 325 MG tablet Take 650 mg by mouth 3 (three) times daily as needed for mild  pain. Do not take more than 4 tablets in a day if you take Hydrocodone-Apap too.   Yes [provider]  albuterol (VENTOLIN HFA) 108 (90 Base) MCG/ACT inhaler Inhale 2 puffs into the lungs every 4 (four) hours as needed for wheezing or shortness of breath.   Yes [provider]  alendronate (FOSAMAX) 70 MG tablet Take 70 mg by mouth once a week. Take with a full glass of water on an empty stomach.   Yes [provider]  busPIRone (BUSPAR) 7.5 MG tablet Take 15 mg by mouth 2 (two) times daily. 05/07/21  Yes [provider]  calcium carbonate (TUMS EX) 750 MG chewable tablet Chew 1 tablet by mouth 4 (four) times daily as needed for heartburn.   Yes [provider]  cetirizine (ZYRTEC) 10 MG tablet Take 10 mg by mouth daily.   Yes [provider]  diphenhydrAMINE (BENADRYL) 25 MG tablet Take 25 mg by mouth every 6 (six) hours as needed for itching.   Yes [provider]  docusate sodium (COLACE) 100 MG capsule Take 1 capsule (100 mg total) by mouth 2 (two) times daily. 10/27/20  Yes Duanne Guess, PA-C  escitalopram (LEXAPRO) 20 MG tablet Take 20 mg by mouth daily.   Yes [provider]  famotidine (PEPCID) 20 MG tablet Take 20 mg by mouth 2 (two) times daily.   Yes [provider]  ferrous sulfate 325 (65 FE) MG tablet Take 325 mg by mouth every Monday, Wednesday, and Friday.   Yes [provider]  fluticasone (FLONASE) 50 MCG/ACT nasal spray Place into both nostrils daily.   Yes [provider]  folic acid (FOLVITE) 1 MG tablet Take 1 mg by mouth daily.   Yes [provider]  gabapentin (NEURONTIN) 600 MG tablet Take 600 mg by mouth 2 (two) times daily. 05/07/21  Yes [provider]  ibuprofen (ADVIL) 400 MG tablet Take 400 mg by mouth every 8 (eight) hours as needed. 05/09/21  Yes [provider]  ipratropium-albuterol (DUONEB) 0.5-2.5 (3) MG/3ML SOLN Take 3 mLs by nebulization  every 6 (six) hours as needed (shortness of breath).   Yes [provider]  leflunomide (ARAVA) 10 MG tablet Take 10 mg by mouth daily. 05/07/21  Yes [provider]  levothyroxine (SYNTHROID, LEVOTHROID) 75 MCG tablet Take 75 mcg by mouth daily before breakfast.   Yes [provider]  lidocaine (LIDODERM) 5 % Place 1 patch onto the skin daily. Remove & Discard patch within 12 hours or as directed by MD   Yes [provider]  liver oil-zinc oxide (DESITIN) 40 % ointment Apply 1 Application topically as needed for irritation.   Yes [provider]  loperamide (IMODIUM) 2 MG capsule Take 2 mg by mouth daily at 12 noon. For Diarrhea 01/20/21  Yes [provider]  meloxicam (MOBIC) 15 MG tablet Take 15 mg by mouth daily. 05/17/21  Yes [provider]  metoprolol succinate (TOPROL-XL) 25 MG 24 hr tablet Take 25 mg by mouth daily.    Yes [provider]  NYSTATIN powder SMARTSIG:2 Topical Twice Daily 05/17/21  Yes [provider]  OLANZapine (ZYPREXA) 2.5 MG tablet Take 2.5 mg by mouth at bedtime. 05/07/21  Yes [provider]  OLANZapine (ZYPREXA) 5 MG tablet Take 5 mg by mouth at bedtime. 05/07/21  Yes [provider]  Olopatadine HCl (PATADAY) 0.2 % SOLN Apply to eye daily as needed.   Yes [provider]  oxybutynin (DITROPAN-XL) 5 MG 24 hr tablet Take 5 mg by mouth daily. 05/07/21  Yes [provider]  predniSONE (DELTASONE) 20 MG tablet Take 20 mg by mouth as needed (asthma attack).   Yes [provider]  Psyllium (METAMUCIL PO) Take 4 capsules by mouth daily.   Yes [provider]  QUEtiapine (SEROQUEL) 50 MG tablet Take 50 mg by mouth at bedtime.   Yes [provider]  traZODone (DESYREL) 100 MG tablet Take 125 mg by mouth at bedtime.   Yes [provider]  traZODone (DESYREL) 50 MG tablet Take 25 mg by mouth at bedtime as needed for sleep. 01/05/21  Yes  [provider]  Vitamin D, Ergocalciferol, (DRISDOL) 1.25 MG (50000 UNIT) CAPS capsule Take 50,000 Units by mouth every 7 (seven) days.   Yes [provider]  busPIRone (BUSPAR) 5 MG tablet Take 5 mg by mouth 2 (two) times daily. Patient not taking: Reported on 10/31/2021 06/07/21   [provider]  hydrocortisone cream 1 % Apply 1 application topically 3 (three) times daily as needed for itching.    [provider]    Allergies as of 09/25/2021   (No Known Allergies)    Family History  Problem Relation Age of Onset   Breast cancer Maternal Aunt    Breast cancer Cousin     Social History   Socioeconomic History   Marital status:  Divorced    Spouse name: Not on file   Number of children: Not on file   Years of education: Not on file   Highest education level: Not on file  Occupational History   Not on file  Tobacco Use   Smoking status: Never   Smokeless tobacco: Never  Vaping Use   Vaping Use: Never used  Substance and Sexual Activity   Alcohol use: No   Drug use: No   Sexual activity: Not Currently    Birth control/protection: Abstinence, Post-menopausal  Other Topics Concern   Not on file  Social History Narrative   Not on file   Social Determinants of Health   Financial Resource Strain: Not on file  Food Insecurity: Not on file  Transportation Needs: Not on file  Physical Activity: Not on file  Stress: Not on file  Social Connections: Not on file  Intimate Partner Violence: Not on file    Review of Systems: See HPI, otherwise negative ROS  Physical Exam: BP (!) 109/56   Pulse (!) 52   Temp (!) 97.5 F (36.4 C) (Temporal)   Resp 18   Ht '5\' 1"'$  (1.549 m)   Wt 95.8 kg   SpO2 98%   BMI 39.92 kg/m  General:   Alert, cooperative in NAD Head:  Normocephalic and atraumatic. Respiratory:  Normal work of breathing. Cardiovascular:  RRR  Impression/Plan: Shelly Freeman is here for cataract surgery.  Risks, benefits,  limitations, and alternatives regarding cataract surgery have been reviewed with the patient.  Questions have been answered.  All parties agreeable.   Benay Pillow, MD  11/13/2021, 12:00 PM

## 2021-11-13 NOTE — Anesthesia Postprocedure Evaluation (Signed)
Anesthesia Post Note  Patient: Nurse, adult  Procedure(s) Performed: CATARACT EXTRACTION PHACO AND INTRAOCULAR LENS PLACEMENT (IOC) LEFT 3.63 00:35.9 (Left: Eye)     Patient location during evaluation: PACU Anesthesia Type: MAC Level of consciousness: awake and alert Pain management: pain level controlled Vital Signs Assessment: post-procedure vital signs reviewed and stable Respiratory status: spontaneous breathing, nonlabored ventilation, respiratory function stable and patient connected to nasal cannula oxygen Cardiovascular status: stable and blood pressure returned to baseline Postop Assessment: no apparent nausea or vomiting Anesthetic complications: no   No notable events documented.  Keatyn Luck, [place]

## 2021-11-13 NOTE — Anesthesia Procedure Notes (Signed)
Procedure Name: MAC Date/Time: 11/13/2021 12:09 PM  Performed by: Dionne Bucy, CRNAPre-anesthesia Checklist: Patient identified, Emergency Drugs available, Suction available, Patient being monitored and Timeout performed Patient Re-evaluated:Patient Re-evaluated prior to induction Oxygen Delivery Method: Nasal cannula Placement Confirmation: positive ETCO2

## 2021-11-14 ENCOUNTER — Encounter: Payer: Self-pay | Admitting: Ophthalmology

## 2021-11-21 ENCOUNTER — Other Ambulatory Visit: Payer: Self-pay | Admitting: Family Medicine

## 2021-11-21 DIAGNOSIS — R102 Pelvic and perineal pain: Secondary | ICD-10-CM

## 2021-11-22 ENCOUNTER — Other Ambulatory Visit: Payer: Self-pay | Admitting: Family Medicine

## 2021-11-22 ENCOUNTER — Ambulatory Visit
Admission: RE | Admit: 2021-11-22 | Discharge: 2021-11-22 | Disposition: A | Discharge status: Home / Self Care | Payer: Medicare (Managed Care) | Source: Ambulatory Visit | Attending: Family Medicine | Admitting: Family Medicine

## 2021-11-22 DIAGNOSIS — R109 Unspecified abdominal pain: Secondary | ICD-10-CM | POA: Insufficient documentation

## 2021-11-22 DIAGNOSIS — R102 Pelvic and perineal pain: Secondary | ICD-10-CM

## 2021-11-27 ENCOUNTER — Encounter: Payer: Self-pay | Admitting: Ophthalmology

## 2021-11-30 NOTE — Discharge Instructions (Signed)

## 2021-12-03 NOTE — Anesthesia Preprocedure Evaluation (Signed)
Anesthesia Evaluation  Patient identified by MRN, date of birth, ID band Patient awake    Reviewed: Allergy & Precautions, H&P , NPO status , Patient's Chart, lab work & pertinent test results, reviewed documented beta blocker date and time   Airway Mallampati: III  TM Distance: >3 FB Neck ROM: full    Dental  (+) Missing, Poor Dentition, Chipped   Pulmonary shortness of breath, sleep apnea and Continuous Positive Airway Pressure Ventilation , COPD,    Pulmonary exam normal breath sounds clear to auscultation       Cardiovascular Exercise Tolerance: Poor hypertension, Pt. on home beta blockers  Rhythm:regular Rate:Normal  ECHO 2021: INTERPRETATION  NORMAL LEFT VENTRICULAR SYSTOLIC FUNCTION  WITH MILD LVH  NORMAL RIGHT VENTRICULAR SYSTOLIC FUNCTION  MILD VALVULAR REGURGITATION (See above)  NO VALVULAR STENOSIS  MILD MR, TR  EF 55-60%    Neuro/Psych PSYCHIATRIC DISORDERS negative neurological ROS     GI/Hepatic Neg liver ROS, GERD  Controlled and Medicated,  Endo/Other  Hypothyroidism Morbid obesity  Renal/GU Renal disease  negative genitourinary   Musculoskeletal  (+) Arthritis , Luupus   Abdominal (+) + obese,   Peds  Hematology negative hematology ROS (+)   Anesthesia Other Findings   Reproductive/Obstetrics negative OB ROS                           Anesthesia Physical  Anesthesia Plan  ASA: 3  Anesthesia Plan: MAC   Post-op Pain Management:    Induction:   PONV Risk Score and Plan: 2 and Treatment may vary due to age or medical condition and Midazolam  Airway Management Planned: Natural Airway  Additional Equipment:   Intra-op Plan:   Post-operative Plan:   Informed Consent: I have reviewed the patients History and Physical, chart, labs and discussed the procedure including the risks, benefits and alternatives for the proposed anesthesia with the patient or  authorized representative who has indicated his/her understanding and acceptance.     Dental Advisory Given  Plan Discussed with: CRNA  Anesthesia Plan Comments:       Anesthesia Quick Evaluation

## 2021-12-04 ENCOUNTER — Encounter
Admission: RE | Disposition: A | Discharge status: Home / Self Care | Payer: Self-pay | Source: Ambulatory Visit | Attending: Ophthalmology

## 2021-12-04 ENCOUNTER — Ambulatory Visit
Admission: RE | Admit: 2021-12-04 | Discharge: 2021-12-04 | Disposition: A | Discharge status: Home / Self Care | Payer: Medicare (Managed Care) | Source: Ambulatory Visit | Attending: Ophthalmology | Admitting: Ophthalmology

## 2021-12-04 ENCOUNTER — Other Ambulatory Visit: Payer: Self-pay

## 2021-12-04 ENCOUNTER — Ambulatory Visit: Payer: Medicare (Managed Care) | Admitting: Anesthesiology

## 2021-12-04 ENCOUNTER — Ambulatory Visit (AMBULATORY_SURGERY_CENTER): Payer: Medicare (Managed Care) | Admitting: Anesthesiology

## 2021-12-04 ENCOUNTER — Encounter: Payer: Self-pay | Admitting: Ophthalmology

## 2021-12-04 DIAGNOSIS — J449 Chronic obstructive pulmonary disease, unspecified: Secondary | ICD-10-CM | POA: Insufficient documentation

## 2021-12-04 DIAGNOSIS — G4733 Obstructive sleep apnea (adult) (pediatric): Secondary | ICD-10-CM

## 2021-12-04 DIAGNOSIS — H2511 Age-related nuclear cataract, right eye: Secondary | ICD-10-CM | POA: Insufficient documentation

## 2021-12-04 DIAGNOSIS — G4730 Sleep apnea, unspecified: Secondary | ICD-10-CM | POA: Diagnosis not present

## 2021-12-04 DIAGNOSIS — Z9989 Dependence on other enabling machines and devices: Secondary | ICD-10-CM

## 2021-12-04 DIAGNOSIS — E039 Hypothyroidism, unspecified: Secondary | ICD-10-CM | POA: Insufficient documentation

## 2021-12-04 DIAGNOSIS — K219 Gastro-esophageal reflux disease without esophagitis: Secondary | ICD-10-CM | POA: Insufficient documentation

## 2021-12-04 DIAGNOSIS — I10 Essential (primary) hypertension: Secondary | ICD-10-CM | POA: Diagnosis not present

## 2021-12-04 HISTORY — PX: CATARACT EXTRACTION W/PHACO: SHX586

## 2021-12-04 SURGERY — PHACOEMULSIFICATION, CATARACT, WITH IOL INSERTION
Anesthesia: Monitor Anesthesia Care | Site: Eye | Laterality: Right

## 2021-12-04 MED ORDER — ARMC OPHTHALMIC DILATING DROPS
1.0000 | OPHTHALMIC | Status: DC | PRN
  Administered 2021-12-04 (×3): 1 via OPHTHALMIC

## 2021-12-04 MED ORDER — LIDOCAINE HCL (PF) 2 % IJ SOLN
INTRAOCULAR | Status: DC | PRN
  Administered 2021-12-04: 1 mL via INTRAOCULAR

## 2021-12-04 MED ORDER — FENTANYL CITRATE (PF) 100 MCG/2ML IJ SOLN
INTRAMUSCULAR | Status: DC | PRN
  Administered 2021-12-04: 50 ug via INTRAVENOUS

## 2021-12-04 MED ORDER — MOXIFLOXACIN HCL 0.5 % OP SOLN
OPHTHALMIC | Status: DC | PRN
  Administered 2021-12-04: 0.2 mL via OPHTHALMIC

## 2021-12-04 MED ORDER — SIGHTPATH DOSE#1 BSS IO SOLN
INTRAOCULAR | Status: DC | PRN
  Administered 2021-12-04: 15 mL

## 2021-12-04 MED ORDER — ACETAMINOPHEN 325 MG PO TABS
650.0000 mg | ORAL_TABLET | Freq: Four times a day (QID) | ORAL | Status: DC | PRN
  Administered 2021-12-04: 650 mg via ORAL

## 2021-12-04 MED ORDER — MIDAZOLAM HCL 2 MG/2ML IJ SOLN
INTRAMUSCULAR | Status: DC | PRN
  Administered 2021-12-04: 1 mg via INTRAVENOUS

## 2021-12-04 MED ORDER — SIGHTPATH DOSE#1 BSS IO SOLN
INTRAOCULAR | Status: DC | PRN
  Administered 2021-12-04: 79 mL via OPHTHALMIC

## 2021-12-04 MED ORDER — TETRACAINE HCL 0.5 % OP SOLN
1.0000 [drp] | OPHTHALMIC | Status: DC | PRN
  Administered 2021-12-04 (×3): 1 [drp] via OPHTHALMIC

## 2021-12-04 MED ORDER — SIGHTPATH DOSE#1 NA HYALUR & NA CHOND-NA HYALUR IO KIT
PACK | INTRAOCULAR | Status: DC | PRN
  Administered 2021-12-04: 1 via OPHTHALMIC

## 2021-12-04 MED ORDER — LACTATED RINGERS IV SOLN: INTRAVENOUS | Status: DC

## 2021-12-04 SURGICAL SUPPLY — 14 items
CATARACT SUITE SIGHTPATH (MISCELLANEOUS) ×2 IMPLANT
DISSECTOR HYDRO NUCLEUS 50X22 (MISCELLANEOUS) ×2 IMPLANT
FEE CATARACT SUITE SIGHTPATH (MISCELLANEOUS) ×1 IMPLANT
GLOVE SURG GAMMEX PI TX LF 7.5 (GLOVE) ×2 IMPLANT
GLOVE SURG SYN 8.5  E (GLOVE) ×1
GLOVE SURG SYN 8.5 E (GLOVE) ×1 IMPLANT
GLOVE SURG SYN 8.5 PF PI (GLOVE) ×1 IMPLANT
LENS IOL TECNIS EYHANCE 23.5 (Intraocular Lens) ×1 IMPLANT
NDL FILTER BLUNT 18X1 1/2 (NEEDLE) ×1 IMPLANT
NEEDLE FILTER BLUNT 18X 1/2SAF (NEEDLE) ×1
NEEDLE FILTER BLUNT 18X1 1/2 (NEEDLE) ×1 IMPLANT
SYR 3ML LL SCALE MARK (SYRINGE) ×2 IMPLANT
SYR 5ML LL (SYRINGE) ×2 IMPLANT
WATER STERILE IRR 250ML POUR (IV SOLUTION) ×2 IMPLANT

## 2021-12-04 NOTE — Anesthesia Procedure Notes (Signed)
Procedure Name: MAC Date/Time: 12/04/2021 10:48 AM  Performed by: Tollie Eth, CRNAPre-anesthesia Checklist: Patient identified, Emergency Drugs available, Suction available and Patient being monitored Patient Re-evaluated:Patient Re-evaluated prior to induction Oxygen Delivery Method: Nasal cannula Induction Type: IV induction Placement Confirmation: positive ETCO2

## 2021-12-04 NOTE — H&P (Signed)
[facility]   Primary Care Physician:  Janna Arch, MD Ophthalmologist: Dr. Benay Pillow  Pre-Procedure History & Physical: HPI:  Shelly Freeman is a 71 y.o. female here for cataract surgery.   Past Medical History:  Diagnosis Date   Arthritis    Collagen vascular disease ([place])    COPD (chronic obstructive pulmonary disease) (HCC)    DDD (degenerative disc disease), lumbar 05/14/2016   Depression    Dizziness    Dyspnea    GERD (gastroesophageal reflux disease)    Heart valve problem    per grandaughter need surgery but they told her may not survive;   History of kidney stones    Hypertension    Hypothyroidism    Lupus ([place])    Neuromuscular disorder ([place])    Sleep apnea    no CPAP   Thyroid disease    Vitamin D deficiency 05/14/2016    Past Surgical History:  Procedure Laterality Date   APPENDECTOMY     BREAST BIOPSY Left    neg   CATARACT EXTRACTION W/PHACO Left 11/13/2021   Procedure: CATARACT EXTRACTION PHACO AND INTRAOCULAR LENS PLACEMENT (IOC) LEFT 3.63 00:35.9;  Surgeon: Eulogio Bear, MD;  Location: [place];  Service: Ophthalmology;  Laterality: Left;   CESAREAN SECTION     COLONOSCOPY N/A 08/24/2021   Procedure: COLONOSCOPY;  Surgeon: [name], DO;  Location: [facility] ENDOSCOPY;  Service: Gastroenterology;  Laterality: N/A;  SPANISH INTERPRETER   COLONOSCOPY WITH PROPOFOL N/A 07/01/2017   Procedure: COLONOSCOPY WITH PROPOFOL;  Surgeon: Manya Silvas, MD;  Location: [facility] ENDOSCOPY;  Service: Endoscopy;  Laterality: N/A;   ESOPHAGOGASTRODUODENOSCOPY (EGD) WITH PROPOFOL N/A 07/01/2017   Procedure: ESOPHAGOGASTRODUODENOSCOPY (EGD) WITH PROPOFOL;  Surgeon: Manya Silvas, MD;  Location: [facility] ENDOSCOPY;  Service: Endoscopy;  Laterality: N/A;   GASTRIC BYPASS     LITHOTRIPSY     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   Procedure: TOTAL KNEE ARTHROPLASTY;  Surgeon: Hessie Knows, MD;  Location: [facility] ORS;  Service:  Orthopedics;  Laterality: Left;   TUBAL LIGATION      Prior to Admission medications   Medication Sig Start Date End Date Taking? Authorizing Provider  acetaminophen (TYLENOL) 325 MG tablet Take 650 mg by mouth 3 (three) times daily as needed for mild pain. Do not take more than 4 tablets in a day if you take Hydrocodone-Apap too.   Yes [provider]  albuterol (VENTOLIN HFA) 108 (90 Base) MCG/ACT inhaler Inhale 2 puffs into the lungs every 4 (four) hours as needed for wheezing or shortness of breath.   Yes [provider]  alendronate (FOSAMAX) 70 MG tablet Take 70 mg by mouth once a week. Take with a full glass of water on an empty stomach.   Yes [provider]  busPIRone (BUSPAR) 7.5 MG tablet Take 15 mg by mouth 2 (two) times daily. 05/07/21  Yes [provider]  calcium carbonate (TUMS EX) 750 MG chewable tablet Chew 1 tablet by mouth 4 (four) times daily as needed for heartburn.   Yes [provider]  cetirizine (ZYRTEC) 10 MG tablet Take 10 mg by mouth daily.   Yes [provider]  diphenhydrAMINE (BENADRYL) 25 MG tablet Take 25 mg by mouth every 6 (six) hours as needed for itching.   Yes [provider]  docusate sodium (COLACE) 100 MG capsule Take 1 capsule (100 mg total) by mouth 2 (two) times daily. 10/27/20  Yes Duanne Guess, PA-C  escitalopram (  LEXAPRO) 20 MG tablet Take 20 mg by mouth daily.   Yes [provider]  famotidine (PEPCID) 20 MG tablet Take 20 mg by mouth 2 (two) times daily.   Yes [provider]  ferrous sulfate 325 (65 FE) MG tablet Take 325 mg by mouth every Monday, Wednesday, and Friday.   Yes [provider]  fluticasone (FLONASE) 50 MCG/ACT nasal spray Place into both nostrils daily.   Yes [provider]  folic acid (FOLVITE) 1 MG tablet Take 1 mg by mouth daily.   Yes [provider]  gabapentin (NEURONTIN) 600 MG tablet Take 600 mg by mouth 2 (two)  times daily. 05/07/21  Yes [provider]  ibuprofen (ADVIL) 400 MG tablet Take 400 mg by mouth every 8 (eight) hours as needed. 05/09/21  Yes [provider]  ipratropium-albuterol (DUONEB) 0.5-2.5 (3) MG/3ML SOLN Take 3 mLs by nebulization every 6 (six) hours as needed (shortness of breath).   Yes [provider]  leflunomide (ARAVA) 10 MG tablet Take 10 mg by mouth daily. 05/07/21  Yes [provider]  levothyroxine (SYNTHROID, LEVOTHROID) 75 MCG tablet Take 75 mcg by mouth daily before breakfast.   Yes [provider]  liver oil-zinc oxide (DESITIN) 40 % ointment Apply 1 Application topically as needed for irritation.   Yes [provider]  loperamide (IMODIUM) 2 MG capsule Take 2 mg by mouth daily at 12 noon. For Diarrhea 01/20/21  Yes [provider]  meloxicam (MOBIC) 15 MG tablet Take 15 mg by mouth daily. 05/17/21  Yes [provider]  metoprolol succinate (TOPROL-XL) 25 MG 24 hr tablet Take 25 mg by mouth daily.    Yes [provider]  OLANZapine (ZYPREXA) 2.5 MG tablet Take 2.5 mg by mouth at bedtime. 05/07/21  Yes [provider]  OLANZapine (ZYPREXA) 5 MG tablet Take 5 mg by mouth at bedtime. 05/07/21  Yes [provider]  Olopatadine HCl (PATADAY) 0.2 % SOLN Apply to eye daily as needed.   Yes [provider]  oxybutynin (DITROPAN-XL) 5 MG 24 hr tablet Take 5 mg by mouth daily. 05/07/21  Yes [provider]  Psyllium (METAMUCIL PO) Take 4 capsules by mouth daily.   Yes [provider]  QUEtiapine (SEROQUEL) 50 MG tablet Take 50 mg by mouth at bedtime.   Yes [provider]  traZODone (DESYREL) 100 MG tablet Take 125 mg by mouth at bedtime.   Yes [provider]  traZODone (DESYREL) 50 MG tablet Take 25 mg by mouth at bedtime as needed for sleep. 01/05/21  Yes [provider]  Vitamin D, Ergocalciferol, (DRISDOL) 1.25 MG (50000 UNIT) CAPS capsule Take  50,000 Units by mouth every 7 (seven) days.   Yes [provider]  busPIRone (BUSPAR) 5 MG tablet Take 5 mg by mouth 2 (two) times daily. Patient not taking: Reported on 10/31/2021 06/07/21   [provider]  hydrocortisone cream 1 % Apply 1 application topically 3 (three) times daily as needed for itching.    [provider]  lidocaine (LIDODERM) 5 % Place 1 patch onto the skin daily. Remove & Discard patch within 12 hours or as directed by MD    [provider]  NYSTATIN powder SMARTSIG:2 Topical Twice Daily 05/17/21   [provider]  predniSONE (DELTASONE) 20 MG tablet Take 20 mg by mouth as needed (asthma attack).    [provider]    Allergies as of 09/25/2021   (No Known  Allergies)    Family History  Problem Relation Age of Onset   Breast cancer Maternal Aunt    Breast cancer Cousin     Social History   Socioeconomic History   Marital status: Divorced    Spouse name: Not on file   Number of children: Not on file   Years of education: Not on file   Highest education level: Not on file  Occupational History   Not on file  Tobacco Use   Smoking status: Never   Smokeless tobacco: Never  Vaping Use   Vaping Use: Never used  Substance and Sexual Activity   Alcohol use: No   Drug use: No   Sexual activity: Not Currently    Birth control/protection: Abstinence, Post-menopausal  Other Topics Concern   Not on file  Social History Narrative   Not on file   Social Determinants of Health   Financial Resource Strain: Not on file  Food Insecurity: Not on file  Transportation Needs: Not on file  Physical Activity: Not on file  Stress: Not on file  Social Connections: Not on file  Intimate Partner Violence: Not on file    Review of Systems: See HPI, otherwise negative ROS  Physical Exam: BP (!) 107/58   Pulse (!) 49   Temp 97.6 F (36.4 C) (Temporal)   Ht '5\' 1"'$  (1.549 m)   Wt 96.2 kg   SpO2 98%   BMI 40.06  kg/m  General:   Alert, cooperative in NAD Head:  Normocephalic and atraumatic. Respiratory:  Normal work of breathing. Cardiovascular:  RRR  Impression/Plan: Shelly Freeman is here for cataract surgery.  Risks, benefits, limitations, and alternatives regarding cataract surgery have been reviewed with the patient.  Questions have been answered.  All parties agreeable.   Benay Pillow, MD  12/04/2021, 10:36 AM

## 2021-12-04 NOTE — Op Note (Signed)
OPERATIVE NOTE  Shelly Freeman [id number] 12/04/2021   PREOPERATIVE DIAGNOSIS:  Nuclear sclerotic cataract right eye.  H25.11   POSTOPERATIVE DIAGNOSIS:    Nuclear sclerotic cataract right eye.     PROCEDURE:  Phacoemusification with posterior chamber intraocular lens placement of the right eye   LENS:   Implant Name Type Inv. Item Serial No. Manufacturer Lot No. LRB No. Used Action  LENS IOL TECNIS EYHANCE 23.5 - [id number] Intraocular Lens LENS IOL TECNIS EYHANCE 23.5 [id number] [employer]  Right 1 Implanted       Procedure(s) with comments: CATARACT EXTRACTION PHACO AND INTRAOCULAR LENS PLACEMENT (IOC) RIGHT (Right) - 3.11 00:32.6  DIB00 +23.5   ULTRASOUND TIME: 0 minutes 32 seconds.  CDE 3.11   SURGEON:  Benay Pillow, MD, MPH  ANESTHESIOLOGIST: Anesthesiologist: [place] Ouch, MD CRNA: Tollie Eth, CRNA   ANESTHESIA:  Topical with tetracaine drops augmented with 1% preservative-free intracameral lidocaine.  ESTIMATED BLOOD LOSS: less than 1 mL.   COMPLICATIONS:  None.   DESCRIPTION OF PROCEDURE:  The patient was identified in the holding room and transported to the operating room and placed in the supine position under the operating microscope.  The right eye was identified as the operative eye and it was prepped and draped in the usual sterile ophthalmic fashion.   A 1.0 millimeter clear-corneal paracentesis was made at the 10:30 position. 0.5 ml of preservative-free 1% lidocaine with epinephrine was injected into the anterior chamber.  The anterior chamber was filled with viscoelastic.  A 2.4 millimeter keratome was used to make a near-clear corneal incision at the 8:00 position.  A curvilinear capsulorrhexis was made with a cystotome and capsulorrhexis forceps.  Balanced salt solution was used to hydrodissect and hydrodelineate the nucleus.   Phacoemulsification was then used in stop and chop fashion to remove the lens nucleus and epinucleus.  The  remaining cortex was then removed using the irrigation and aspiration handpiece. Viscoelastic was then placed into the capsular bag to distend it for lens placement.  A lens was then injected into the capsular bag.  The remaining viscoelastic was aspirated.   Wounds were hydrated with balanced salt solution.  The anterior chamber was inflated to a physiologic pressure with balanced salt solution.   Intracameral vigamox 0.1 mL undiluted was injected into the eye and a drop placed onto the ocular surface.  No wound leaks were noted.  The patient was taken to the recovery room in stable condition without complications of anesthesia or surgery  Benay Pillow 12/04/2021, 11:03 AM

## 2021-12-04 NOTE — Transfer of Care (Signed)
Immediate Anesthesia Transfer of Care Note  Patient: Shelly Freeman  Procedure(s) Performed: CATARACT EXTRACTION PHACO AND INTRAOCULAR LENS PLACEMENT (IOC) RIGHT (Right: Eye)  Patient Location: PACU  Anesthesia Type: MAC  Level of Consciousness: awake, alert  and patient cooperative  Airway and Oxygen Therapy: Patient Spontanous Breathing and Patient connected to supplemental oxygen  Post-op Assessment: Post-op Vital signs reviewed, Patient's Cardiovascular Status Stable, Respiratory Function Stable, Patent Airway and No signs of Nausea or vomiting  Post-op Vital Signs: Reviewed and stable  Complications: No notable events documented.

## 2021-12-04 NOTE — Anesthesia Postprocedure Evaluation (Signed)
Anesthesia Post Note  Patient: Nurse, adult  Procedure(s) Performed: CATARACT EXTRACTION PHACO AND INTRAOCULAR LENS PLACEMENT (IOC) RIGHT (Right: Eye)     Patient location during evaluation: PACU Anesthesia Type: MAC Level of consciousness: awake and alert Pain management: pain level controlled Vital Signs Assessment: post-procedure vital signs reviewed and stable Respiratory status: spontaneous breathing, nonlabored ventilation and respiratory function stable Cardiovascular status: blood pressure returned to baseline and stable Postop Assessment: no apparent nausea or vomiting Anesthetic complications: no   No notable events documented.  Iran Ouch

## 2022-01-02 ENCOUNTER — Ambulatory Visit (INDEPENDENT_AMBULATORY_CARE_PROVIDER_SITE_OTHER): Payer: Medicare (Managed Care) | Admitting: Urology

## 2022-01-02 ENCOUNTER — Other Ambulatory Visit
Admission: RE | Admit: 2022-01-02 | Discharge: 2022-01-02 | Disposition: A | Discharge status: Home / Self Care | Payer: Medicare (Managed Care) | Source: Ambulatory Visit | Attending: Urology | Admitting: Urology

## 2022-01-02 ENCOUNTER — Encounter: Payer: Self-pay | Admitting: Urology

## 2022-01-02 ENCOUNTER — Other Ambulatory Visit: Payer: Self-pay | Admitting: *Deleted

## 2022-01-02 VITALS — BP 105/65 | HR 68 | Ht 61.0 in | Wt 213.8 lb

## 2022-01-02 DIAGNOSIS — R300 Dysuria: Secondary | ICD-10-CM | POA: Insufficient documentation

## 2022-01-02 DIAGNOSIS — R350 Frequency of micturition: Secondary | ICD-10-CM

## 2022-01-02 DIAGNOSIS — R109 Unspecified abdominal pain: Secondary | ICD-10-CM

## 2022-01-02 DIAGNOSIS — R3989 Other symptoms and signs involving the genitourinary system: Secondary | ICD-10-CM

## 2022-01-02 DIAGNOSIS — R3915 Urgency of urination: Secondary | ICD-10-CM | POA: Diagnosis not present

## 2022-01-02 LAB — URINALYSIS, COMPLETE (UACMP) WITH MICROSCOPIC
Glucose, UA: NEGATIVE mg/dL
Nitrite: NEGATIVE
Specific Gravity, Urine: 1.025 (ref 1.005–1.030)
WBC, UA: >50 WBC/hpf (ref 0–5)
pH: 5.5 (ref 5.0–8.0)

## 2022-01-02 MED ORDER — SULFAMETHOXAZOLE-TRIMETHOPRIM 800-160 MG PO TABS: 1.0000 | ORAL_TABLET | Freq: Two times a day (BID) | ORAL | 0 refills | Status: DC

## 2022-01-02 NOTE — Patient Instructions (Signed)
Disuria Dysuria La disuria es dolor o molestia durante la miccin. El dolor o la molestia se pueden sentir en la parte del cuerpo que transporta la orina fuera de la vejiga (uretra) o en el tejido que rodea los genitales. El dolor tambin se puede sentir en la zona de la ingle, en la parte inferior del abdomen y de la zona lumbar. Quizs tenga que orinar con frecuencia o la sensacin repentina de tener que orinar (tenesmo vesical). La disuria puede afectar a los hombres, pero es ms frecuente en las mujeres. La causa puede deberse a muchos problemas diferentes: Infeccin de las vas urinarias. Clculos renales o en la vejiga. Ciertas infecciones de transmisin sexual (ITS), como la clamidia. Deshidratacin. Inflamacin de los tejidos de la vagina. Uso de ciertos medicamentos. Uso de ciertos jabones o productos perfumados que provocan irritacin. Siga estas instrucciones en su casa: Medicamentos Use los medicamentos de venta libre y los recetados solamente como se lo haya indicado el mdico. Si le recetaron un antibitico, tmelo como se lo haya indicado el mdico. No deje de tomar el antibitico aunque comience a sentirse mejor. Qu debe comer y beber  Electronics engineer suficiente lquido como para Theatre manager la orina de color amarillo plido. Evite las bebidas con cafena, el t y el alcohol. Estas bebidas pueden irritar la vejiga y Actuary disuria. En los hombres, el alcohol puede irritar la prstata. Instrucciones generales Controle su afeccin para detectar cualquier cambio. Orine con frecuencia. Evite retener la orina durante largos perodos. Si es [place], debe limpiarse de adelante hacia atrs despus de Garment/textile technologist o defecar. Use cada trozo de papel higinico una sola vez. Vaciar la vejiga despus de Adult nurse. Cumpla con todas las visitas de seguimiento. Esto es importante. Si le realizaron pruebas para Product manager causa de la disuria, es su responsabilidad retirar los resultados de [place].  Consulte al mdico o pregunte en el departamento donde se realiza la prueba cundo estarn [employer]. Comunquese con un mdico si: Tiene fiebre. Siente dolor en la espalda o a los costados del cuerpo. Tiene nuseas o vmitos. Observa sangre en la orina. Est orinando con ms frecuencia que lo habitual. Solicite ayuda de inmediato si: El dolor es intenso y no se Engineer, production con los medicamentos. No puede comer ni beber sin vomitar. Se siente confundido. Tiene el latido cardaco acelerado en reposo. Tiene temblores o escalofros. Se siente muy dbil. Resumen La disuria es dolor o molestia al Garment/textile technologist. Existen muchas afecciones que pueden causar disuria. Si tiene disuria, es posible que tenga que orinar con frecuencia o tenga la sensacin repentina de tener que orinar (tenesmo vesical). Controle su afeccin para Actuary cambio. Cumpla con todas las visitas de seguimiento. Asegrese de orinar con frecuencia y beber suficiente lquido para mantener la orina de color amarillo plido. Esta informacin no tiene Marine scientist el consejo del mdico. Asegrese de hacerle al mdico cualquier pregunta que tenga. Document Revised: 02/12/2020 Document Reviewed: 02/12/2020 Elsevier Patient Education  [employer].

## 2022-01-02 NOTE — Progress Notes (Signed)
   01/02/2022 12:51 PM   Breta Larna Daughters 22-Oct-1950 [id number]  Reason for visit: Dysuria, right-sided pain, history recurrent UTI  HPI: I saw Mrs. Ky today for the above issues.  Today's visit was conducted using a Patent attorney.  I originally met her in January 2023 after she had been diagnosed multiple times in the ER with possible right-sided hydronephrosis with cultures positive for Enterobacter and E. coli.  CT scans at that time showed right-sided perinephric standing but no definitive hydronephrosis and no evidence of stones.  She also has a history of a left-sided PCNL over 20 years ago.  She reports some intermittent right-sided flank pain but primarily some dysuria and urgency/frequency with urination over the last few days.  She is concerned this could be a UTI.  She denies any fevers, chills, or gross hematuria.  Urinalysis today suspicious for infection, 11-20 squamous cells, greater than 50 WBCs, 0-5 RBCs, few bacteria, WBC clumps present, small leukocytes, sent for culture.  I personally viewed and interpreted the abdominal ultrasound from 11/22/2021 that shows no hydronephrosis or stones.  Suspect UTI as the etiology of her dysuria.  Started on Bactrim DS twice daily and cultures sent.  Consider cross-sectional imaging in the future if no improvement or fevers, but reassuring abdominal ultrasound last month.  Would also recommend considering topical estrogen cream and cranberry tablets in the future for UTI prevention   Billey Co, MD  [place] [facility] [address] [place], [place] [address] [phone]

## 2022-01-04 ENCOUNTER — Telehealth: Payer: Self-pay

## 2022-01-04 DIAGNOSIS — N390 Urinary tract infection, site not specified: Secondary | ICD-10-CM

## 2022-01-04 LAB — URINE CULTURE: Culture: 30000 — AB

## 2022-01-04 MED ORDER — NITROFURANTOIN MONOHYD MACRO 100 MG PO CAPS: 100.0000 mg | ORAL_CAPSULE | Freq: Two times a day (BID) | ORAL | 0 refills | Status: AC

## 2022-01-04 NOTE — Telephone Encounter (Signed)
-----   Message from Billey Co, MD sent at 01/04/2022  7:57 AM EDT ----- Please add nitrofurantoin '100mg'$  BID X 7 days based on culture results, should finish bactrim as well  Nickolas Madrid, MD 01/04/2022

## 2022-01-04 NOTE — Telephone Encounter (Signed)
Called pt informed her of the information below. Pt voiced understanding. RX sent.  

## 2022-01-09 LAB — MISC LABCORP TEST (SEND OUT): Labcorp test code: 86884

## 2022-01-26 NOTE — Progress Notes (Deleted)
01/29/2022 8:57 AM   [name] [birth year] [id number]  Referring provider: Janna Arch, [address] [place],  [place] [id number]  Urological history: 1.  Nephrolithiasis -Left PCNL greater than 20 years ago  2.  Bladder wall thickening on CT -cysto (06/2021) - NED -Urine cytology (06/2021) -negative  No chief complaint on file.   HPI: Shelly Freeman is a 71 y.o. female who presents today for follow-up after being treated for UTI.  When she was seen by Dr. Diamantina Providence in January 02, 2022, she was reporting intermittent right-sided flank pain with dysuria, urgency and frequency.  Urine culture was positive for Enterococcus faecalis and E. coli, but it was a dirty specimen.  Today, ***  UA ***   PMH: Past Medical History:  Diagnosis Date   Arthritis    Collagen vascular disease (HCC)    COPD (chronic obstructive pulmonary disease) (HCC)    DDD (degenerative disc disease), lumbar 05/14/2016   Depression    Dizziness    Dyspnea    GERD (gastroesophageal reflux disease)    Heart valve problem    per grandaughter need surgery but they told her may not survive;   History of kidney stones    Hypertension    Hypothyroidism    Lupus ([place])    Neuromuscular disorder ([place])    Sleep apnea    no CPAP   Thyroid disease    Vitamin D deficiency 05/14/2016    Surgical History: Past Surgical History:  Procedure Laterality Date   APPENDECTOMY     BREAST BIOPSY Left    neg   CATARACT EXTRACTION W/PHACO Left 11/13/2021   Procedure: CATARACT EXTRACTION PHACO AND INTRAOCULAR LENS PLACEMENT (IOC) LEFT 3.63 00:35.9;  Surgeon: Eulogio Bear, MD;  Location: [facility];  Service: Ophthalmology;  Laterality: Left;   CATARACT EXTRACTION W/PHACO Right 12/04/2021   Procedure: CATARACT EXTRACTION PHACO AND INTRAOCULAR LENS PLACEMENT (IOC) RIGHT;  Surgeon: Eulogio Bear, MD;  Location: [facility];  Service: Ophthalmology;  Laterality: Right;   3.11 00:32.6   CESAREAN SECTION     COLONOSCOPY N/A 08/24/2021   Procedure: COLONOSCOPY;  Surgeon: [name], DO;  Location: [facility] ENDOSCOPY;  Service: Gastroenterology;  Laterality: N/A;  SPANISH INTERPRETER   COLONOSCOPY WITH PROPOFOL N/A 07/01/2017   Procedure: COLONOSCOPY WITH PROPOFOL;  Surgeon: Manya Silvas, MD;  Location: [facility] ENDOSCOPY;  Service: Endoscopy;  Laterality: N/A;   ESOPHAGOGASTRODUODENOSCOPY (EGD) WITH PROPOFOL N/A 07/01/2017   Procedure: ESOPHAGOGASTRODUODENOSCOPY (EGD) WITH PROPOFOL;  Surgeon: Manya Silvas, MD;  Location: [facility] ENDOSCOPY;  Service: Endoscopy;  Laterality: N/A;   GASTRIC BYPASS     LITHOTRIPSY     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   Procedure: TOTAL KNEE ARTHROPLASTY;  Surgeon: Hessie Knows, MD;  Location: [facility];  Service: Orthopedics;  Laterality: Left;   TUBAL LIGATION      Home Medications:  Allergies as of 01/29/2022   No Known Allergies      Medication List        Accurate as of January 26, 2022  8:57 AM. If you have any questions, ask your nurse or doctor.          acetaminophen 650 MG CR tablet Commonly known as: TYLENOL SMARTSIG:1 By Mouth Daily   albuterol 108 (90 Base) MCG/ACT inhaler Commonly known as: VENTOLIN HFA Inhale into the lungs. Inhale 2 puffs into the lungs every 4 (four) hours as needed for wheezing or shortness of breath.   alendronate 70  MG tablet Commonly known as: FOSAMAX Take 70 mg by mouth once a week. Take with a full glass of water on an empty stomach.   busPIRone 15 MG tablet Commonly known as: BUSPAR Take 15 mg by mouth 2 (two) times daily.   calcium carbonate 750 MG chewable tablet Commonly known as: TUMS EX Chew 1 tablet by mouth 4 (four) times daily as needed for heartburn.   celecoxib 100 MG capsule Commonly known as: CELEBREX Take 1 capsule by mouth 2 (two) times daily.   cetirizine 10 MG tablet Commonly known as: ZYRTEC Take 10 mg by mouth daily.    cholecalciferol 25 MCG (1000 UNIT) tablet Commonly known as: VITAMIN D3 Take 1,000 Units by mouth daily.   Diclofenac Sodium 3 % Gel apply small amount twice daily to knee as needed for pain   Dificid 200 MG Tabs tablet Generic drug: fidaxomicin   Difluprednate 0.05 % Emul Apply to eye.   diphenhydrAMINE 25 MG tablet Commonly known as: BENADRYL Take 25 mg by mouth every 6 (six) hours as needed for itching.   docusate sodium 100 MG capsule Commonly known as: COLACE Take 1 capsule (100 mg total) by mouth 2 (two) times daily.   escitalopram 20 MG tablet Commonly known as: LEXAPRO Take 20 mg by mouth daily.   famotidine 20 MG tablet Commonly known as: PEPCID Take 20 mg by mouth 2 (two) times daily.   ferrous sulfate 325 (65 FE) MG tablet Take 325 mg by mouth every Monday, Wednesday, and Friday.   fluticasone 50 MCG/ACT nasal spray Commonly known as: FLONASE Place into both nostrils daily.   folic acid 1 MG tablet Commonly known as: FOLVITE Take 1 mg by mouth daily.   gabapentin 600 MG tablet Commonly known as: NEURONTIN Take 600 mg by mouth 2 (two) times daily.   hydrocortisone cream 1 % Apply 1 application topically 3 (three) times daily as needed for itching.   ibuprofen 400 MG tablet Commonly known as: ADVIL Take 400 mg by mouth every 8 (eight) hours as needed.   ipratropium-albuterol 0.5-2.5 (3) MG/3ML Soln Commonly known as: DUONEB Take 3 mLs by nebulization every 6 (six) hours as needed (shortness of breath).   leflunomide 10 MG tablet Commonly known as: ARAVA Take 10 mg by mouth daily.   levothyroxine 75 MCG tablet Commonly known as: SYNTHROID Take 75 mcg by mouth daily before breakfast.   lidocaine 5 % Commonly known as: LIDODERM Place 1 patch onto the skin daily. Remove & Discard patch within 12 hours or as directed by MD   liver oil-zinc oxide 40 % ointment Commonly known as: DESITIN Apply 1 Application topically as needed for irritation.    Desitin 40 % Pste Generic drug: Zinc Oxide SMARTSIG:sparingly Topical Twice Daily   loperamide 2 MG capsule Commonly known as: IMODIUM Take 2 mg by mouth daily at 12 noon. For Diarrhea   meloxicam 15 MG tablet Commonly known as: MOBIC Take 15 mg by mouth daily.   metoprolol succinate 25 MG 24 hr tablet Commonly known as: TOPROL-XL Take 25 mg by mouth daily.   nystatin powder Generic drug: nystatin SMARTSIG:2 Topical Twice Daily   OLANZapine 2.5 MG tablet Commonly known as: ZYPREXA Take 2.5 mg by mouth at bedtime.   OLANZapine 5 MG tablet Commonly known as: ZYPREXA Take 5 mg by mouth at bedtime.   oxybutynin 10 MG 24 hr tablet Commonly known as: DITROPAN-XL Take 10 mg by mouth daily.   pantoprazole 40 MG tablet  Commonly known as: PROTONIX Take by mouth.   Pataday 0.2 % Soln Generic drug: Olopatadine HCl Apply to eye daily as needed.   predniSONE 20 MG tablet Commonly known as: DELTASONE Take 20 mg by mouth as needed (asthma attack).   Psyllium Fiber 0.52 g Caps Take 0.52 Gy-cm2 by mouth 4 (four) times daily.   QUEtiapine 50 MG tablet Commonly known as: SEROQUEL Take 50 mg by mouth at bedtime.   sulfamethoxazole-trimethoprim 800-160 MG tablet Commonly known as: BACTRIM DS Take 1 tablet by mouth 2 (two) times daily.   traZODone 100 MG tablet Commonly known as: DESYREL Take 125 mg by mouth at bedtime.   traZODone 50 MG tablet Commonly known as: DESYREL Take 25 mg by mouth at bedtime as needed for sleep.   TURMERIC PO Take by mouth.   Vigamox 0.5 % ophthalmic solution Generic drug: moxifloxacin Apply to eye.   Vitamin D (Ergocalciferol) 1.25 MG (50000 UNIT) Caps capsule Commonly known as: DRISDOL Take 50,000 Units by mouth every 7 (seven) days.        Allergies: No Known Allergies  Family History: Family History  Problem Relation Age of Onset   Breast cancer Maternal Aunt    Breast cancer Cousin     Social History:  reports that she  has never smoked. She has never used smokeless tobacco. She reports that she does not drink alcohol and does not use drugs.  ROS: Pertinent ROS in HPI  Physical Exam: There were no vitals taken for this visit.  Constitutional:  Well nourished. Alert and oriented, No acute distress. HEENT: [place] AT, moist mucus membranes.  Trachea midline, no masses. Cardiovascular: No clubbing, cyanosis, or edema. Respiratory: Normal respiratory effort, no increased work of breathing. GI: Abdomen is soft, non tender, non distended, no abdominal masses. Liver and spleen not palpable.  No hernias appreciated.  Stool sample for occult testing is not indicated.   GU: No CVA tenderness.  No bladder fullness or masses.  *** external genitalia, *** pubic hair distribution, no lesions.  Normal urethral meatus, no lesions, no prolapse, no discharge.   No urethral masses, tenderness and/or tenderness. No bladder fullness, tenderness or masses. *** vagina mucosa, *** estrogen effect, no discharge, no lesions, *** pelvic support, *** cystocele and *** rectocele noted.  No cervical motion tenderness.  Uterus is freely mobile and non-fixed.  No adnexal/parametria masses or tenderness noted.  Anus and perineum are without rashes or lesions.   ***  Skin: No rashes, bruises or suspicious lesions. Lymph: No cervical or inguinal adenopathy. Neurologic: Grossly intact, no focal deficits, moving all 4 extremities. Psychiatric: Normal mood and affect.    Laboratory Data: Urinalysis ***   I have reviewed the labs.   Pertinent Imaging: ***  Assessment & Plan:  ***  1. UTI  *** No follow-ups on file.  These notes generated with voice recognition software. I apologize for typographical errors.  [place], [place] [address]  [place], [place] [address] [phone]

## 2022-01-29 ENCOUNTER — Ambulatory Visit: Payer: Medicare (Managed Care) | Admitting: Urology

## 2022-01-29 DIAGNOSIS — N390 Urinary tract infection, site not specified: Secondary | ICD-10-CM

## 2022-01-29 DIAGNOSIS — B9620 Unspecified Escherichia coli [E. coli] as the cause of diseases classified elsewhere: Secondary | ICD-10-CM

## 2022-03-06 ENCOUNTER — Other Ambulatory Visit: Payer: Self-pay | Admitting: Family Medicine

## 2022-03-06 DIAGNOSIS — Z1231 Encounter for screening mammogram for malignant neoplasm of breast: Secondary | ICD-10-CM

## 2022-04-05 ENCOUNTER — Encounter: Payer: Self-pay | Admitting: Family Medicine

## 2022-04-06 ENCOUNTER — Other Ambulatory Visit: Payer: Self-pay | Admitting: Family Medicine

## 2022-04-06 DIAGNOSIS — M7989 Other specified soft tissue disorders: Secondary | ICD-10-CM

## 2022-04-13 ENCOUNTER — Ambulatory Visit
Admission: RE | Admit: 2022-04-13 | Discharge: 2022-04-13 | Disposition: A | Discharge status: Home / Self Care | Payer: Medicare (Managed Care) | Source: Ambulatory Visit | Attending: Family Medicine | Admitting: Family Medicine

## 2022-04-13 DIAGNOSIS — M7989 Other specified soft tissue disorders: Secondary | ICD-10-CM | POA: Diagnosis present

## 2022-04-25 ENCOUNTER — Other Ambulatory Visit: Payer: Self-pay

## 2022-04-25 ENCOUNTER — Ambulatory Visit (INDEPENDENT_AMBULATORY_CARE_PROVIDER_SITE_OTHER): Payer: Medicare (Managed Care) | Admitting: Surgery

## 2022-04-25 ENCOUNTER — Encounter: Payer: Self-pay | Admitting: Surgery

## 2022-04-25 VITALS — BP 155/79 | HR 55 | Temp 98.7°F | Ht 61.0 in | Wt 217.0 lb

## 2022-04-25 DIAGNOSIS — K429 Umbilical hernia without obstruction or gangrene: Secondary | ICD-10-CM

## 2022-04-25 DIAGNOSIS — R222 Localized swelling, mass and lump, trunk: Secondary | ICD-10-CM | POA: Diagnosis not present

## 2022-04-25 NOTE — Patient Instructions (Addendum)
CT scheduled 05/14/22 @ 10 am @ [facility] Drink 2 cups of water prior to having your CT done. Do not eat food prior to your CT.      Please see your appointment listed below.

## 2022-04-25 NOTE — Progress Notes (Signed)
Patient ID: Shelly Freeman, female   DOB: 21-Jun-1950, 71 y.o.   MRN: [id number]  HPI Shelly Freeman is a 71 y.o. female in consultation for a left upper chest soft tissue mass.  She reports that over the last few years he has been increasing in size and there is discomfort and mild pain that is intermittent.  He denies any weight loss.  No fevers no chills no B type symptoms.  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 He does have multiple abdominal operations. Does have a history of COPD, depression, lupus  and arthritis. She does have limited mobility due to to pains in her knees. Hx left knee replacement, She uses a cane Recent CBC and CMP  was nml Surgical history significant for appendectomy, gastric bypass, C section, ventral hernia repair.  She states that some of the surgeries were done in [place] (paretns are from [place] )and some of them were done at [facility]  HPI  Past Medical History:  Diagnosis Date   Arthritis    Collagen vascular disease ([place])    COPD (chronic obstructive pulmonary disease) ([place])    DDD (degenerative disc disease), lumbar 05/14/2016   Depression    Dizziness    Dyspnea    GERD (gastroesophageal reflux disease)    Heart valve problem    per grandaughter need surgery but they told her may not survive;   History of kidney stones    Hypertension    Hypothyroidism    Lupus ([place])    Neuromuscular disorder ([place])    Sleep apnea    no CPAP   Thyroid disease    Vitamin D deficiency 05/14/2016    Past Surgical History:  Procedure Laterality Date   APPENDECTOMY     BREAST BIOPSY Left    neg   CATARACT EXTRACTION W/PHACO Left 11/13/2021   Procedure: CATARACT EXTRACTION PHACO AND INTRAOCULAR LENS PLACEMENT (IOC) LEFT 3.63 00:35.9;  Surgeon: Eulogio Bear, MD;  Location: [place];  Service: Ophthalmology;  Laterality: Left;   CATARACT EXTRACTION  W/PHACO Right 12/04/2021   Procedure: CATARACT EXTRACTION PHACO AND INTRAOCULAR LENS PLACEMENT (IOC) RIGHT;  Surgeon: Eulogio Bear, MD;  Location: [place];  Service: Ophthalmology;  Laterality: Right;  3.11 00:32.6   CESAREAN SECTION     COLONOSCOPY N/A 08/24/2021   Procedure: COLONOSCOPY;  Surgeon: [name], DO;  Location: [facility] ENDOSCOPY;  Service: Gastroenterology;  Laterality: N/A;  SPANISH INTERPRETER   COLONOSCOPY WITH PROPOFOL N/A 07/01/2017   Procedure: COLONOSCOPY WITH PROPOFOL;  Surgeon: Manya Silvas, MD;  Location: [facility] ENDOSCOPY;  Service: Endoscopy;  Laterality: N/A;   ESOPHAGOGASTRODUODENOSCOPY (EGD) WITH PROPOFOL N/A 07/01/2017   Procedure: ESOPHAGOGASTRODUODENOSCOPY (EGD) WITH PROPOFOL;  Surgeon: Manya Silvas, MD;  Location: [facility] ENDOSCOPY;  Service: Endoscopy;  Laterality: N/A;   GASTRIC BYPASS     LITHOTRIPSY     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   Procedure: TOTAL KNEE ARTHROPLASTY;  Surgeon: Hessie Knows, MD;  Location: [facility]S;  Service: Orthopedics;  Laterality: Left;   TUBAL LIGATION      Family History  Problem Relation Age of Onset   Breast cancer Maternal Aunt    Breast cancer Cousin     Social History Social History   Tobacco Use   Smoking status: Never   Smokeless tobacco: Never  Vaping Use   Vaping Use: Never used  Substance Use Topics  Alcohol use: No   Drug use: No    No Known Allergies  Current Outpatient Medications  Medication Sig Dispense Refill   acetaminophen (TYLENOL) 650 MG CR tablet SMARTSIG:1 By Mouth Daily     albuterol (VENTOLIN HFA) 108 (90 Base) MCG/ACT inhaler Inhale into the lungs. Inhale 2 puffs into the lungs every 4 (four) hours as needed for wheezing or shortness of breath.     alendronate (FOSAMAX) 70 MG tablet Take 70 mg by mouth once a week. Take with a full glass of water on an empty stomach.     busPIRone (BUSPAR) 15 MG tablet Take 15 mg by mouth 2 (two) times daily.      calcium carbonate (TUMS EX) 750 MG chewable tablet Chew 1 tablet by mouth 4 (four) times daily as needed for heartburn.     celecoxib (CELEBREX) 100 MG capsule Take 1 capsule by mouth 2 (two) times daily.     cetirizine (ZYRTEC) 10 MG tablet Take 10 mg by mouth daily.     cholecalciferol (VITAMIN D3) 25 MCG (1000 UNIT) tablet Take 1,000 Units by mouth daily.     DESITIN 40 % PSTE SMARTSIG:sparingly Topical Twice Daily     Diclofenac Sodium 3 % GEL apply small amount twice daily to knee as needed for pain     DIFICID 200 MG TABS tablet      Difluprednate 0.05 % EMUL Apply to eye.     diphenhydrAMINE (BENADRYL) 25 MG tablet Take 25 mg by mouth every 6 (six) hours as needed for itching.     docusate sodium (COLACE) 100 MG capsule Take 1 capsule (100 mg total) by mouth 2 (two) times daily. 10 capsule 0   escitalopram (LEXAPRO) 20 MG tablet Take 20 mg by mouth daily.     famotidine (PEPCID) 20 MG tablet Take 20 mg by mouth 2 (two) times daily.     ferrous sulfate 325 (65 FE) MG tablet Take 325 mg by mouth every Monday, Wednesday, and Friday.     fluticasone (FLONASE) 50 MCG/ACT nasal spray Place into both nostrils daily.     folic acid (FOLVITE) 1 MG tablet Take 1 mg by mouth daily.     gabapentin (NEURONTIN) 600 MG tablet Take 600 mg by mouth 2 (two) times daily.     hydrocortisone cream 1 % Apply 1 application topically 3 (three) times daily as needed for itching.     ibuprofen (ADVIL) 400 MG tablet Take 400 mg by mouth every 8 (eight) hours as needed.     ipratropium-albuterol (DUONEB) 0.5-2.5 (3) MG/3ML SOLN Take 3 mLs by nebulization every 6 (six) hours as needed (shortness of breath).     leflunomide (ARAVA) 10 MG tablet Take 10 mg by mouth daily.     levothyroxine (SYNTHROID, LEVOTHROID) 75 MCG tablet Take 75 mcg by mouth daily before breakfast.     lidocaine (LIDODERM) 5 % Place 1 patch onto the skin daily. Remove & Discard patch within 12 hours or as directed by MD     liver oil-zinc oxide  (DESITIN) 40 % ointment Apply 1 Application topically as needed for irritation.     meloxicam (MOBIC) 15 MG tablet Take 15 mg by mouth daily.     metoprolol succinate (TOPROL-XL) 25 MG 24 hr tablet Take 25 mg by mouth daily.      OLANZapine (ZYPREXA) 2.5 MG tablet Take 2.5 mg by mouth at bedtime.     OLANZapine (ZYPREXA) 5 MG tablet Take 5 mg by  mouth at bedtime.     Olopatadine HCl (PATADAY) 0.2 % SOLN Apply to eye daily as needed.     oxybutynin (DITROPAN-XL) 10 MG 24 hr tablet Take 10 mg by mouth daily.     pantoprazole (PROTONIX) 40 MG tablet Take by mouth.     Psyllium Fiber 0.52 g CAPS Take 0.52 Gy-cm2 by mouth 4 (four) times daily.     QUEtiapine (SEROQUEL) 50 MG tablet Take 50 mg by mouth at bedtime.     traZODone (DESYREL) 100 MG tablet Take 125 mg by mouth at bedtime.     traZODone (DESYREL) 50 MG tablet Take 25 mg by mouth at bedtime as needed for sleep.     TURMERIC PO Take by mouth.     VIGAMOX 0.5 % ophthalmic solution Apply to eye.     Vitamin D, Ergocalciferol, (DRISDOL) 1.25 MG (50000 UNIT) CAPS capsule Take 50,000 Units by mouth every 7 (seven) days.     No current facility-administered medications for this visit.     Review of Systems Full ROS  was asked and was negative except for the information on the HPI  Physical Exam Blood pressure (!) 155/79, pulse (!) 55, temperature 98.7 F (37.1 C), temperature source Oral, height '5\' 1"'$  (1.549 m), weight 217 lb (98.4 kg), SpO2 97 %. CONSTITUTIONAL: NAD. EYES: Pupils are equal, round,  Sclera are non-icteric. EARS, NOSE, MOUTH AND THROAT:  The oral mucosa is pink and moist. Hearing is intact to voice. LYMPH NODES:  Lymph nodes in the neck are normal. RESPIRATORY:  Lungs are clear. There is normal respiratory effort, with equal breath sounds bilaterally, and without pathologic use of accessory muscles. CARDIOVASCULAR: Heart is regular without murmurs, gallops, or rubs. Chest: 10 x 8 cm left upper anterior soft tissue mass,  c/w lipoma GI: The abdomen is  soft, nontender, and nondistended. Midline scar with some fullness and bulging, ? Hernia , exam is limited due to large pannus  There are no palpable masses. There is no hepatosplenomegaly. There are normal bowel sounds in all quadrants. GU: Rectal deferred.   MUSCULOSKELETAL: Normal muscle strength and tone. No cyanosis or edema.   SKIN: Turgor is good and there are no pathologic skin lesions or ulcers. NEUROLOGIC: Motor and sensation is grossly normal. Cranial nerves are grossly intact. PSYCH:  Oriented to person, place and time. Affect is normal.  Data Reviewed  I have personally reviewed the patient's imaging, laboratory findings and medical records.    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  Given its size I would like to further evaluate with a CT scan of the chest.  She does also have a questionable ventral hernia with some abdominal discomfort that I will also like to interrogate.  I will order CT scan of the abdomen pelvis to further evaluate the abdominal wall anatomy. As we have appropriate workup we will likely proceed with excision of the lipoma and given its size probably in the OR.  Discussed with the patient in detail about my thought process.  Please note that I spent 45 minutes in this encounter including personally reviewing imaging studies, coordinating her care, placing orders and performing appropriate documentation   Caroleen Hamman, MD FACS General Surgeon 04/25/2022, 11:59 AM

## 2022-04-26 ENCOUNTER — Encounter: Payer: Self-pay | Admitting: Surgery

## 2022-05-14 ENCOUNTER — Ambulatory Visit
Admission: RE | Admit: 2022-05-14 | Discharge: 2022-05-14 | Disposition: A | Discharge status: Home / Self Care | Payer: Medicare (Managed Care) | Source: Ambulatory Visit | Attending: Surgery | Admitting: Surgery

## 2022-05-14 DIAGNOSIS — R222 Localized swelling, mass and lump, trunk: Secondary | ICD-10-CM | POA: Diagnosis not present

## 2022-05-14 DIAGNOSIS — K429 Umbilical hernia without obstruction or gangrene: Secondary | ICD-10-CM | POA: Insufficient documentation

## 2022-05-14 MED ORDER — IOHEXOL 300 MG/ML  SOLN
100.0000 mL | Freq: Once | INTRAMUSCULAR | Status: AC | PRN
  Administered 2022-05-14: 100 mL via INTRAVENOUS

## 2022-05-16 ENCOUNTER — Encounter: Payer: Self-pay | Admitting: Surgery

## 2022-05-16 ENCOUNTER — Ambulatory Visit (INDEPENDENT_AMBULATORY_CARE_PROVIDER_SITE_OTHER): Payer: Medicare (Managed Care) | Admitting: Surgery

## 2022-05-16 VITALS — BP 115/74 | HR 66 | Temp 98.2°F | Ht 61.0 in | Wt 218.4 lb

## 2022-05-16 DIAGNOSIS — D171 Benign lipomatous neoplasm of skin and subcutaneous tissue of trunk: Secondary | ICD-10-CM | POA: Diagnosis not present

## 2022-05-16 NOTE — Patient Instructions (Addendum)
Cleotis Nipper programadora de [place], [place], lo llamar dentro de 24 a 48 horas para programar su cita. Si no ha tenido noticias suyas despus de 48 horas, llame a nuestra oficina. Tenga la hoja azul disponible cuando llame para anotar informacin importante.  Si tiene alguna inquietud o pregunta, no dude en llamar a nuestra oficina.  Extraccin de un lipoma, cuidados posteriores Lipoma Removal, Care After La siguiente informacin ofrece orientacin sobre cmo cuidarse despus del procedimiento. El mdico tambin podr darle instrucciones ms especficas. Comunquese con el mdico si tiene problemas o preguntas. Qu puedo esperar despus del procedimiento? Despus del procedimiento, es normal tener los siguientes sntomas: Dolor leve. Hinchazn. Moretones. Siga estas indicaciones en su casa: Baos  No tome baos de inmersin, no nade ni use el jacuzzi hasta que el mdico lo autorice. Pregntele al mdico si puede ducharse. Thurston Pounds solo le permitan darse baos de [place]. Mantenga la venda (vendaje) limpia y seca hasta que el mdico le diga que se la puede quitar. Cuidado de la incisin   Siga las instrucciones del mdico acerca del cuidado de la incisin. Asegrese de hacer lo siguiente: Lvese las manos con agua y jabn durante al menos 20 segundos antes y despus de [place]arry manager el vendaje. Use desinfectante para manos si no dispone de [place]. Cambie el vendaje como se lo haya indicado el mdico. No retire los puntos (suturas), la goma para cerrar la piel o las tiras [place]. Es posible que estos cierres cutneos deban quedar puestos en la piel durante 2 semanas o ms tiempo. Si los bordes de las tiras adhesivas empiezan a despegarse y Therapist, sports, puede recortar los que estn sueltos. No retire las tiras [facility] por completo a menos que el mdico se lo indique. Controle la zona de la incisin todos los das para detectar signos de infeccin. Est atento a los siguientes signos: Aumento  del enrojecimiento, la hinchazn o Conservation officer, historic buildings. Lquido o sangre. Calor. Pus o mal olor. Medicamentos Use los medicamentos de venta libre y los recetados solamente como se lo haya indicado el mdico. Si le recetaron un antibitico, tmelo como se lo haya indicado el mdico. No deje de usar el antibitico aunque comience a Sports administrator. Indicaciones generales  Si le administraron un sedante durante el procedimiento, puede afectarlo por varias horas. No conduzca ni opere maquinaria hasta que el mdico le indique que es seguro [place]. No consuma ningn producto que contenga nicotina o tabaco antes del procedimiento. Estos productos incluyen cigarrillos, tabaco para Higher education careers adviser y aparatos de vapeo, como los Psychologist, sport and exercise. Estos pueden retrasar la cicatrizacin despus de la ciruga. Si necesita ayuda para dejar de consumir estos productos, consulte al [employer]. Retome sus actividades normales segn lo indicado por el mdico. Pregntele al mdico qu actividades son seguras para usted. Concurra a [place]. Esto es importante. Comunquese con un mdico si: Aumentan el enrojecimiento, la hinchazn o el dolor alrededor de su incisin. Presenta lquido o sangre provenientes de la incisin. La incisin est caliente al tacto. Tiene pus o percibe que sale mal olor de su incisin. Su dolor no se alivia con medicamentos. Solicite ayuda de inmediato si: Siente escalofros o tiene fiebre. Siente dolor intenso. Resumen Despus del procedimiento, es normal tener algo de dolor, hinchazn y [place]. Siga las instrucciones del mdico acerca del cuidado de la incisin. Comunquese con el mdico si tiene signos de infeccin, como un aumento del enrojecimiento, la hinchazn o Conservation officer, historic buildings. Esta informacin no tiene Psychologist, clinical  consejo del mdico. Asegrese de hacerle al mdico cualquier pregunta que tenga.

## 2022-05-17 ENCOUNTER — Telehealth: Payer: Self-pay | Admitting: Surgery

## 2022-05-17 NOTE — Telephone Encounter (Signed)
Called patient back, she is made aware of all dates regarding her surgery, this is also mailed to her.

## 2022-05-17 NOTE — Telephone Encounter (Signed)
Not able to leave a message, no voice mail.   Pre-Admission date/time, and Surgery date at [facility].  Surgery Date: 06/14/22 Preadmission Testing Date: 06/07/22 (phone 8a-1p)  Also patient needs to call at [phone], between 1-3:00pm the day before surgery, to find out what time to arrive for surgery.

## 2022-05-18 NOTE — Progress Notes (Signed)
Outpatient Surgical Follow Up  05/18/2022  Shelly Freeman is an 72 y.o. female.   Chief Complaint  Patient presents with   Follow-up    Left chest wall mass Umbilical hernia    HPI: Shelly Freeman is a 72 y.o. female following for  a left upper chest soft tissue mass.  She reports that over the last few years he has been increasing in size and there is discomfort and mild pain that is intermittent.  He denies any weight loss.  No fevers no chills no B type symptoms.  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 Did have also a CT scan of personally reviewed as well as the abdomen.  There is evidence of soft tissue mass here for chest wall.  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 SHe does have multiple abdominal operations. Does have a history of COPD, depression, lupus  and arthritis. She does have limited mobility due to to pains in her knees. Hx left knee replacement, She uses a cane  Surgical history significant for appendectomy, gastric bypass, C section, ventral hernia repair.  She states that some of the surgeries were done in [place] (paretns are from [place] )and some of them were done at [facility]    Past Medical History:  Diagnosis Date   Arthritis    Collagen vascular disease ([place])    COPD (chronic obstructive pulmonary disease) ([place])    DDD (degenerative disc disease), lumbar 05/14/2016   Depression    Dizziness    Dyspnea    GERD (gastroesophageal reflux disease)    Heart valve problem    per grandaughter need surgery but they told her may not survive;   History of kidney stones    Hypertension    Hypothyroidism    Lupus ([place])    Neuromuscular disorder ([place])    Sleep apnea    no CPAP   Thyroid disease    Vitamin D deficiency 05/14/2016    Past Surgical History:  Procedure Laterality Date   APPENDECTOMY     BREAST BIOPSY Left    neg   CATARACT EXTRACTION W/PHACO Left 11/13/2021    Procedure: CATARACT EXTRACTION PHACO AND INTRAOCULAR LENS PLACEMENT (IOC) LEFT 3.63 00:35.9;  Surgeon: Eulogio Bear, MD;  Location: [place];  Service: Ophthalmology;  Laterality: Left;   CATARACT EXTRACTION W/PHACO Right 12/04/2021   Procedure: CATARACT EXTRACTION PHACO AND INTRAOCULAR LENS PLACEMENT (IOC) RIGHT;  Surgeon: Eulogio Bear, MD;  Location: [place];  Service: Ophthalmology;  Laterality: Right;  3.11 00:32.6   CESAREAN SECTION     COLONOSCOPY N/A 08/24/2021   Procedure: COLONOSCOPY;  Surgeon: [name], DO;  Location: [facility] ENDOSCOPY;  Service: Gastroenterology;  Laterality: N/A;  SPANISH INTERPRETER   COLONOSCOPY WITH PROPOFOL N/A 07/01/2017   Procedure: COLONOSCOPY WITH PROPOFOL;  Surgeon: Manya Silvas, MD;  Location: [facility] ENDOSCOPY;  Service: Endoscopy;  Laterality: N/A;   ESOPHAGOGASTRODUODENOSCOPY (EGD) WITH PROPOFOL N/A 07/01/2017   Procedure: ESOPHAGOGASTRODUODENOSCOPY (EGD) WITH PROPOFOL;  Surgeon: Manya Silvas, MD;  Location: [facility] ENDOSCOPY;  Service: Endoscopy;  Laterality: N/A;   GASTRIC BYPASS     LITHOTRIPSY     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   Procedure: TOTAL KNEE ARTHROPLASTY;  Surgeon: Hessie Knows, MD;  Location: [facility] ORS;  Service: Orthopedics;  Laterality: Left;   TUBAL LIGATION      Family History  Problem Relation Age of Onset  Breast cancer Maternal Aunt    Breast cancer Cousin     Social History:  reports that she has never smoked. She has never used smokeless tobacco. She reports that she does not drink alcohol and does not use drugs.  Allergies: No Known Allergies  Medications reviewed.    ROS Full ROS performed and is otherwise negative other than what is stated in HPI   BP 115/74   Pulse 66   Temp 98.2 F (36.8 C) (Oral)   Ht '5\' 1"'$  (1.549 m)   Wt 218 lb 6.4 oz (99.1 kg)   SpO2 93%   BMI 41.27 kg/m   Physical Exam CONSTITUTIONAL: NAD. EYES: Pupils are equal, round,   Sclera are non-icteric. EARS, NOSE, MOUTH AND THROAT:  The oral mucosa is pink and moist. Hearing is intact to voice. LYMPH NODES:  Lymph nodes in the neck are normal. RESPIRATORY:  Lungs are clear. There is normal respiratory effort, with equal breath sounds bilaterally, and without pathologic use of accessory muscles. CARDIOVASCULAR: Heart is regular without murmurs, gallops, or rubs. Chest: 10 x 8 cm left upper anterior soft tissue mass, c/w lipoma GI: The abdomen is  soft, nontender, and nondistended. Midline scar with some fullness and bulging,  There are no palpable masses. There is no hepatosplenomegaly. There are normal bowel sounds in all quadrants. GU: Rectal deferred.   MUSCULOSKELETAL: Normal muscle strength and tone. No cyanosis or edema.   SKIN: Turgor is good and there are no pathologic skin lesions or ulcers. NEUROLOGIC: Motor and sensation is grossly normal. Cranial nerves are grossly intact. PSYCH:  Oriented to person, place and time. Affect is normal.   Assessment/Plan: 72 year old female with asymptomatic anterior chest wall soft tissue mass consistent with normal.  Discussed with patient in the OR setting.  Procedure discussed with patient detail.  Risk, benefits and possible complication including but not limited to Bleeding, infection, pain bleeding.  She understands and wishes to proceed  Please note that I spent 30 minutes in this encounter including personally reviewing imaging studies, coordinating her care, placing orders and performing appropriate documentation    Caroleen Hamman, MD [place] Surgeon

## 2022-05-18 NOTE — H&P (View-Only) (Signed)
Outpatient Surgical Follow Up  05/18/2022  Shelly Freeman is an 72 y.o. female.   Chief Complaint  Patient presents with   Follow-up    Left chest wall mass Umbilical hernia    HPI: Shelly Freeman is a 72 y.o. female following for  a left upper chest soft tissue mass.  She reports that over the last few years he has been increasing in size and there is discomfort and mild pain that is intermittent.  He denies any weight loss.  No fevers no chills no B type symptoms.  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 Did have also a CT scan of personally reviewed as well as the abdomen.  There is evidence of soft tissue mass here for chest wall.  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 SHe does have multiple abdominal operations. Does have a history of COPD, depression, lupus  and arthritis. She does have limited mobility due to to pains in her knees. Hx left knee replacement, She uses a cane  Surgical history significant for appendectomy, gastric bypass, C section, ventral hernia repair.  She states that some of the surgeries were done in [place] (paretns are from [place] )and some of them were done at [facility]    Past Medical History:  Diagnosis Date   Arthritis    Collagen vascular disease ([place])    COPD (chronic obstructive pulmonary disease) ([place])    DDD (degenerative disc disease), lumbar 05/14/2016   Depression    Dizziness    Dyspnea    GERD (gastroesophageal reflux disease)    Heart valve problem    per grandaughter need surgery but they told her may not survive;   History of kidney stones    Hypertension    Hypothyroidism    Lupus ([place])    Neuromuscular disorder ([place])    Sleep apnea    no CPAP   Thyroid disease    Vitamin D deficiency 05/14/2016    Past Surgical History:  Procedure Laterality Date   APPENDECTOMY     BREAST BIOPSY Left    neg   CATARACT EXTRACTION W/PHACO Left 11/13/2021    Procedure: CATARACT EXTRACTION PHACO AND INTRAOCULAR LENS PLACEMENT (IOC) LEFT 3.63 00:35.9;  Surgeon: Eulogio Bear, MD;  Location: [place];  Service: Ophthalmology;  Laterality: Left;   CATARACT EXTRACTION W/PHACO Right 12/04/2021   Procedure: CATARACT EXTRACTION PHACO AND INTRAOCULAR LENS PLACEMENT (IOC) RIGHT;  Surgeon: Eulogio Bear, MD;  Location: [facility];  Service: Ophthalmology;  Laterality: Right;  3.11 00:32.6   CESAREAN SECTION     COLONOSCOPY N/A 08/24/2021   Procedure: COLONOSCOPY;  Surgeon: [name], DO;  [facility] ENDOSCOPY;  Service: Gastroenterology;  Laterality: N/A;  SPANISH INTERPRETER   COLONOSCOPY WITH PROPOFOL N/A 07/01/2017   Procedure: COLONOSCOPY WITH PROPOFOL;  Surgeon: Manya Silvas, MD;  Location: [facility] ENDOSCOPY;  Service: Endoscopy;  Laterality: N/A;   ESOPHAGOGASTRODUODENOSCOPY (EGD) WITH PROPOFOL N/A 07/01/2017   Procedure: ESOPHAGOGASTRODUODENOSCOPY (EGD) WITH PROPOFOL;  Surgeon: Manya Silvas, MD;  Location: [facility] ENDOSCOPY;  Service: Endoscopy;  Laterality: N/A;   GASTRIC BYPASS     LITHOTRIPSY     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   Procedure: TOTAL KNEE ARTHROPLASTY;  Surgeon: Hessie Knows, MD;  Location: [facility] ORS;  Service: Orthopedics;  Laterality: Left;   TUBAL LIGATION      Family History  Problem Relation Age of Onset  Breast cancer Maternal Aunt    Breast cancer Cousin     Social History:  reports that she has never smoked. She has never used smokeless tobacco. She reports that she does not drink alcohol and does not use drugs.  Allergies: No Known Allergies  Medications reviewed.    ROS Full ROS performed and is otherwise negative other than what is stated in HPI   BP 115/74   Pulse 66   Temp 98.2 F (36.8 C) (Oral)   Ht '5\' 1"'$  (1.549 m)   Wt 218 lb 6.4 oz (99.1 kg)   SpO2 93%   BMI 41.27 kg/m   Physical Exam CONSTITUTIONAL: NAD. EYES: Pupils are equal, round,   Sclera are non-icteric. EARS, NOSE, MOUTH AND THROAT:  The oral mucosa is pink and moist. Hearing is intact to voice. LYMPH NODES:  Lymph nodes in the neck are normal. RESPIRATORY:  Lungs are clear. There is normal respiratory effort, with equal breath sounds bilaterally, and without pathologic use of accessory muscles. CARDIOVASCULAR: Heart is regular without murmurs, gallops, or rubs. Chest: 10 x 8 cm left upper anterior soft tissue mass, c/w lipoma GI: The abdomen is  soft, nontender, and nondistended. Midline scar with some fullness and bulging,  There are no palpable masses. There is no hepatosplenomegaly. There are normal bowel sounds in all quadrants. GU: Rectal deferred.   MUSCULOSKELETAL: Normal muscle strength and tone. No cyanosis or edema.   SKIN: Turgor is good and there are no pathologic skin lesions or ulcers. NEUROLOGIC: Motor and sensation is grossly normal. Cranial nerves are grossly intact. PSYCH:  Oriented to person, place and time. Affect is normal.   Assessment/Plan: 72 year old female with asymptomatic anterior chest wall soft tissue mass consistent with normal.  Discussed with patient in the OR setting.  Procedure discussed with patient detail.  Risk, benefits and possible complication including but not limited to Bleeding, infection, pain bleeding.  She understands and wishes to proceed  Please note that I spent 30 minutes in this encounter including personally reviewing imaging studies, coordinating her care, placing orders and performing appropriate documentation    Caroleen Hamman, MD [place] Surgeon

## 2022-05-29 ENCOUNTER — Ambulatory Visit
Admission: RE | Admit: 2022-05-29 | Discharge: 2022-05-29 | Disposition: A | Discharge status: Home / Self Care | Payer: Medicare (Managed Care) | Source: Ambulatory Visit | Attending: Family Medicine | Admitting: Family Medicine

## 2022-05-29 DIAGNOSIS — Z1231 Encounter for screening mammogram for malignant neoplasm of breast: Secondary | ICD-10-CM | POA: Insufficient documentation

## 2022-05-29 DIAGNOSIS — I38 Endocarditis, valve unspecified: Secondary | ICD-10-CM | POA: Insufficient documentation

## 2022-06-07 ENCOUNTER — Other Ambulatory Visit: Payer: Self-pay

## 2022-06-07 ENCOUNTER — Encounter
Admission: RE | Admit: 2022-06-07 | Discharge: 2022-06-07 | Disposition: A | Discharge status: Home / Self Care | Payer: Medicare (Managed Care) | Source: Ambulatory Visit | Attending: Surgery | Admitting: Surgery

## 2022-06-07 DIAGNOSIS — Z01812 Encounter for preprocedural laboratory examination: Secondary | ICD-10-CM

## 2022-06-07 HISTORY — DX: Angina pectoris, unspecified: I20.9

## 2022-06-07 NOTE — Patient Instructions (Addendum)
Your procedure is scheduled on: 06/14/22 - Thursday Report to the Registration Desk on the 1st floor of the [facility]. To find out your arrival time, please call [phone] between 1PM - 3PM on: 06/13/22 - Wednesday If your arrival time is 6:00 am, do not arrive prior to that time as the [facility] entrance doors do not open until 6:00 am.  REMEMBER: Instructions that are not followed completely may result in serious medical risk, up to and including death; or upon the discretion of your surgeon and anesthesiologist your surgery may need to be rescheduled.  Do not eat food or drink any liquids after midnight the night before surgery.  No gum chewing, lozengers or hard candies.  TAKE THESE MEDICATIONS THE MORNING OF SURGERY WITH A SIP OF WATER:  - busPIRone (BUSPAR)  - celecoxib (CELEBREX)  - escitalopram (LEXAPRO)  - gabapentin (NEURONTIN)  - levothyroxine (SYNTHROID) - metoprolol succinate (TOPROL-XL)  - oxybutynin (DITROPAN-XL)  - pantoprazole (PROTONIX)   Use albuterol (VENTOLIN HFA)  on the day of surgery and bring to the hospital.  One week prior to surgery: meloxicam (MOBIC)  Stop Anti-inflammatories (NSAIDS) such as Advil, Aleve, Ibuprofen, Motrin, Naproxen, Naprosyn and Aspirin based products such as Excedrin, Goodys Powder, BC Powder.  Stop ANY OVER THE COUNTER supplements until after surgery. folic acid (FOLVITE) , cholecalciferol (VITAMIN D3)    You may however, continue to take Tylenol if needed for pain up until the day of surgery.  No Alcohol for 24 hours before or after surgery.  No Smoking including e-cigarettes for 24 hours prior to surgery.  No chewable tobacco products for at least 6 hours prior to surgery.  No nicotine patches on the day of surgery.  Do not use any "recreational" drugs for at least a week prior to your surgery.  Please be advised that the combination of cocaine and anesthesia may have negative outcomes, up to and including death. If  you test positive for cocaine, your surgery will be cancelled.  On the morning of surgery brush your teeth with toothpaste and water, you may rinse your mouth with mouthwash if you wish. Do not swallow any toothpaste or mouthwash.  Use CHG Soap or wipes as directed on instruction sheet.  Do not wear jewelry, make-up, hairpins, clips or nail polish.  Do not wear lotions, powders, or perfumes.   Do not shave body from the neck down 48 hours prior to surgery just in case you cut yourself which could leave a site for infection. Also, freshly shaved skin may become irritated if using the CHG soap.  Contact lenses, hearing aids and dentures may not be worn into surgery.  Do not bring valuables to the hospital. [facility] is not responsible for any missing/lost belongings or valuables.   Notify your doctor if there is any change in your medical condition (cold, fever, infection).  Wear comfortable clothing (specific to your surgery type) to the hospital.  After surgery, you can help prevent lung complications by doing breathing exercises.  Take deep breaths and cough every 1-2 hours. Your doctor may order a device called an Incentive Spirometer to help you take deep breaths. When coughing or sneezing, hold a pillow firmly against your incision with both hands. This is called "splinting." Doing this helps protect your incision. It also decreases belly discomfort.  If you are being admitted to the hospital overnight, leave your suitcase in the car. After surgery it may be brought to your room.  If you  are being discharged the day of surgery, you will not be allowed to drive home. You will need a responsible individual to drive you home and stay with you for 24 hours after surgery.   If you are taking public transportation, you will need to have a responsible adult (18 years or older) with you. Please confirm with your physician that it is acceptable to use public transportation.   Please  call the [facility] Dept. at [phone] if you have any questions about these instructions.  Surgery Visitation Policy:  Patients undergoing a surgery or procedure may have two family members or support persons with them as long as the person is not COVID-19 positive or experiencing its symptoms.   Inpatient Visitation:    Visiting hours are 7 a.m. to 8 p.m. Up to four visitors are allowed at one time in a patient room. The visitors may rotate out with other people during the day. One designated support person (adult) may remain overnight.  Due to an increase in RSV and influenza rates and associated hospitalizations, children ages 23 and under will not be able to visit patients in [facility]. Masks continue to be strongly recommended.  Su procedimiento est programado para el: 12/07/22 Shelly Freeman Presntese en el mostrador de Press photographer [place]. Para saber su hora de llegada, llame al [phone] entre la 1:00 p. m. y las 3:00 p. m. el: 06/13/22 - mircoles Si su hora de llegada es las 6:00 a. m., no llegue antes de esa hora ya que las puertas de [place] [facility] [place] no se abren hasta las 6:00 a. m.   RECORDAR: Las instrucciones que no se siguen completamente pueden generar riesgos mdicos graves, que pueden llegar hasta la [place]; o, segn el criterio de su cirujano y Environmental health practitioner, es posible que sea Firefighter su Leisure centre manager.   No coma ni beba lquidos despus de la medianoche del da anterior a la ciruga. No mascar chicle, pastillas ni caramelos duros.   TOMA ESTOS MEDICAMENTOS LA MAANA DE LA CIRUGA CON UN sorbo de agua:   - busPIRone (BUSPAR) - celecoxib (CELEBREX) - escitalopram (LEXAPRO) - gabapentina (NEURONTIN) - levotiroxina (SINTROIDE) - succinato de metoprolol (TOPROL-XL) - oxibutinina (DITROPAN-XL) - pantoprazol (PROTONIX)   Use albuterol (VENTOLIN HFA) el da de la ciruga y [name] al hospital.    Shelly Freeman semana antes de la ciruga: meloxicam (MOBIC) Detenga los antiinflamatorios (AINE) como Advil, Aleve, Ibuprofeno, Motrin, Naproxen, Naprosyn y productos a base de aspirina como Excedrin, Goodys Powder, [employer].  Suspenda CUALQUIER suplemento de venta libre hasta despus de la [place]. cido flico (FOLVITE), colecalciferol (VITAMINA D3)  Sin embargo, puede continuar tomando Tylenol si es necesario para Data processing manager da de la [place].  No consumir alcohol durante 24 horas antes o despus de la [place].  No fumar, incluidos los cigarrillos electrnicos, durante las 24 horas previas a la [place]. No consumir productos de tabaco masticables durante al menos 6 horas antes de la [place]. Sin parches de Special educational needs teacher de la [place].  No utilice ninguna droga "recreativa" durante al menos una semana antes de la [place]. Tenga en cuenta que la combinacin de cocana y anestesia puede [employer], que pueden llegar hasta la [place]. Si su prueba de cocana da positivo, su ciruga ser cancelada.  La maana de la ciruga cepille sus dientes con pasta dental y agua, puede enjuagarse la boca con enjuague bucal si lo desea. No ingiera  pasta de dientes ni enjuague bucal.  Utilice jabn o toallitas CHG como se indica en la hoja de instrucciones.  No use joyas, maquillaje, horquillas, clips ni esmalte de uas.  No use lociones, polvos ni perfumes.  No se afeite el cuerpo desde el cuello hacia abajo 48 horas antes de la ciruga en caso de que se corte, lo que podra dejar un sitio de infeccin. Adems, la piel recin afeitada puede irritarse si se utiliza el jabn CHG.  No se pueden usar lentes de contacto, audfonos ni dentaduras postizas durante la [place].  No lleve objetos de valor al hospital. [facility] no es responsable de ninguna pertenencia u objeto de valor perdido o perdido.  Notifique a su mdico si hay algn cambio en su condicin mdica (resfriado, fiebre,  infeccin).  Lleve ropa cmoda (especfica para su tipo de [place]) al hospital.  Despus de la ciruga, usted puede ayudar a prevenir complicaciones pulmonares haciendo ejercicios de respiracin. Respire profundamente y tosa cada 1 o 2 horas. Su mdico puede indicarle un dispositivo llamado espirmetro incentivador para ayudarle a respirar profundamente. Al toser o Brewing technologist, sostenga firmemente una almohada contra la incisin con ambas manos. Esto se llama "ferulizacin". Hacer esto ayuda a proteger su incisin. [place] molestias abdominales.  Si vas a pasar la noche en el hospital, deja tu maleta en el coche. Despus de la [place], es posible que lo lleven a su habitacin.  Si le dan el alta el da de la [place], no se le permitir conducir a casa. Necesitar que una persona responsable lo lleve a su casa y se quede con usted durante 24 horas despus de la [place].  Si viaja en transporte pblico, deber ir acompaado de un adulto responsable (de 18 aos o ms). Confirme con su mdico que es aceptable utilizar el transporte pblico.  Llame al Departamento de pruebas previas a la admisin al [phone] si tiene alguna pregunta sobre estas instrucciones.  Poltica de visitas a ciruga:  [employer] se someten a [place] o procedimiento pueden [employer] familiares o personas de apoyo con ellos, siempre y cuando la persona no sea positiva para COVID-19 ni experimente sus sntomas.  Visitas para pacientes hospitalizados:  El horario de visita es de 7 a 20 horas. Se permiten hasta cuatro visitantes a la vez en la habitacin de un paciente. Los visitantes podrn rotar con Holiday representative. Shelly Freeman persona de apoyo designada (adulto) podr pasar la noche.  Debido a un aumento en las tasas de VSR e influenza y las hospitalizaciones asociadas, los nios menores de 12 aos no podrn visitar a los pacientes en los hospitales [place]. Se siguen  recomendando encarecidamente las mascarillas.

## 2022-06-07 NOTE — Pre-Procedure Instructions (Signed)
PAT call completed with patient, translator at [facility], and [facility] patient care nurse. Instructions will be faxed to [facility].

## 2022-06-14 ENCOUNTER — Other Ambulatory Visit: Payer: Self-pay

## 2022-06-14 ENCOUNTER — Encounter
Admission: RE | Disposition: A | Discharge status: Home / Self Care | Payer: Self-pay | Source: Ambulatory Visit | Attending: Surgery

## 2022-06-14 ENCOUNTER — Ambulatory Visit (INDEPENDENT_AMBULATORY_CARE_PROVIDER_SITE_OTHER): Payer: Medicare (Managed Care) | Admitting: Surgery

## 2022-06-14 ENCOUNTER — Ambulatory Visit: Payer: Medicare (Managed Care) | Admitting: Certified Registered"

## 2022-06-14 ENCOUNTER — Ambulatory Visit
Admission: RE | Admit: 2022-06-14 | Discharge: 2022-06-14 | Disposition: A | Discharge status: Home / Self Care | Payer: Medicare (Managed Care) | Source: Ambulatory Visit | Attending: Surgery | Admitting: Surgery

## 2022-06-14 DIAGNOSIS — L7632 Postprocedural hematoma of skin and subcutaneous tissue following other procedure: Secondary | ICD-10-CM

## 2022-06-14 DIAGNOSIS — E039 Hypothyroidism, unspecified: Secondary | ICD-10-CM | POA: Insufficient documentation

## 2022-06-14 DIAGNOSIS — Z9884 Bariatric surgery status: Secondary | ICD-10-CM | POA: Diagnosis not present

## 2022-06-14 DIAGNOSIS — F32A Depression, unspecified: Secondary | ICD-10-CM | POA: Insufficient documentation

## 2022-06-14 DIAGNOSIS — M1990 Unspecified osteoarthritis, unspecified site: Secondary | ICD-10-CM | POA: Diagnosis not present

## 2022-06-14 DIAGNOSIS — K429 Umbilical hernia without obstruction or gangrene: Secondary | ICD-10-CM | POA: Insufficient documentation

## 2022-06-14 DIAGNOSIS — M329 Systemic lupus erythematosus, unspecified: Secondary | ICD-10-CM | POA: Insufficient documentation

## 2022-06-14 DIAGNOSIS — Z01812 Encounter for preprocedural laboratory examination: Secondary | ICD-10-CM

## 2022-06-14 DIAGNOSIS — G4730 Sleep apnea, unspecified: Secondary | ICD-10-CM | POA: Diagnosis not present

## 2022-06-14 DIAGNOSIS — Z96652 Presence of left artificial knee joint: Secondary | ICD-10-CM | POA: Diagnosis not present

## 2022-06-14 DIAGNOSIS — D171 Benign lipomatous neoplasm of skin and subcutaneous tissue of trunk: Secondary | ICD-10-CM | POA: Insufficient documentation

## 2022-06-14 DIAGNOSIS — K219 Gastro-esophageal reflux disease without esophagitis: Secondary | ICD-10-CM | POA: Insufficient documentation

## 2022-06-14 DIAGNOSIS — I10 Essential (primary) hypertension: Secondary | ICD-10-CM | POA: Insufficient documentation

## 2022-06-14 DIAGNOSIS — J449 Chronic obstructive pulmonary disease, unspecified: Secondary | ICD-10-CM | POA: Insufficient documentation

## 2022-06-14 DIAGNOSIS — Z09 Encounter for follow-up examination after completed treatment for conditions other than malignant neoplasm: Secondary | ICD-10-CM

## 2022-06-14 HISTORY — PX: LIPOMA EXCISION: SHX5283

## 2022-06-14 SURGERY — EXCISION LIPOMA
Anesthesia: General | Laterality: Left

## 2022-06-14 MED ORDER — LACTATED RINGERS IV SOLN: INTRAVENOUS | Status: DC

## 2022-06-14 MED ORDER — CEFAZOLIN SODIUM-DEXTROSE 2-4 GM/100ML-% IV SOLN
2.0000 g | INTRAVENOUS | Status: AC
  Administered 2022-06-14: 2 g via INTRAVENOUS

## 2022-06-14 MED ORDER — ONDANSETRON HCL 4 MG/2ML IJ SOLN
INTRAMUSCULAR | Status: AC
  Filled 2022-06-14: qty 2

## 2022-06-14 MED ORDER — LIDOCAINE HCL (PF) 2 % IJ SOLN
INTRAMUSCULAR | Status: AC
  Filled 2022-06-14: qty 5

## 2022-06-14 MED ORDER — CHLORHEXIDINE GLUCONATE CLOTH 2 % EX PADS: 6.0000 | MEDICATED_PAD | Freq: Once | CUTANEOUS | Status: DC

## 2022-06-14 MED ORDER — FENTANYL CITRATE (PF) 100 MCG/2ML IJ SOLN
25.0000 ug | INTRAMUSCULAR | Status: DC | PRN
  Administered 2022-06-14 (×4): 25 ug via INTRAVENOUS

## 2022-06-14 MED ORDER — BUPIVACAINE LIPOSOME 1.3 % IJ SUSP
INTRAMUSCULAR | Status: AC
  Filled 2022-06-14: qty 20

## 2022-06-14 MED ORDER — GLYCOPYRROLATE 0.2 MG/ML IJ SOLN
INTRAMUSCULAR | Status: AC
  Filled 2022-06-14: qty 1

## 2022-06-14 MED ORDER — FENTANYL CITRATE (PF) 100 MCG/2ML IJ SOLN
INTRAMUSCULAR | Status: AC
  Filled 2022-06-14: qty 2

## 2022-06-14 MED ORDER — GLYCOPYRROLATE 0.2 MG/ML IJ SOLN
INTRAMUSCULAR | Status: DC | PRN
  Administered 2022-06-14: .2 mg via INTRAVENOUS

## 2022-06-14 MED ORDER — CHLORHEXIDINE GLUCONATE CLOTH 2 % EX PADS
6.0000 | MEDICATED_PAD | Freq: Once | CUTANEOUS | Status: AC
  Administered 2022-06-14: 6 via TOPICAL

## 2022-06-14 MED ORDER — ACETAMINOPHEN 500 MG PO TABS: 1000.0000 mg | ORAL_TABLET | ORAL | Status: AC

## 2022-06-14 MED ORDER — EPHEDRINE 5 MG/ML INJ
INTRAVENOUS | Status: AC
  Filled 2022-06-14: qty 5

## 2022-06-14 MED ORDER — ONDANSETRON HCL 4 MG/2ML IJ SOLN: 4.0000 mg | Freq: Once | INTRAMUSCULAR | Status: DC | PRN

## 2022-06-14 MED ORDER — CHLORHEXIDINE GLUCONATE 0.12 % MT SOLN
OROMUCOSAL | Status: AC
  Administered 2022-06-14: 15 mL via OROMUCOSAL
  Filled 2022-06-14: qty 15

## 2022-06-14 MED ORDER — EPHEDRINE SULFATE (PRESSORS) 50 MG/ML IJ SOLN
INTRAMUSCULAR | Status: DC | PRN
  Administered 2022-06-14: 10 mg via INTRAVENOUS

## 2022-06-14 MED ORDER — BUPIVACAINE HCL (PF) 0.25 % IJ SOLN
INTRAMUSCULAR | Status: AC
  Filled 2022-06-14: qty 30

## 2022-06-14 MED ORDER — CELECOXIB 200 MG PO CAPS
ORAL_CAPSULE | ORAL | Status: AC
  Filled 2022-06-14: qty 1

## 2022-06-14 MED ORDER — FENTANYL CITRATE (PF) 100 MCG/2ML IJ SOLN
INTRAMUSCULAR | Status: DC | PRN
  Administered 2022-06-14: 25 ug via INTRAVENOUS
  Administered 2022-06-14: 50 ug via INTRAVENOUS
  Administered 2022-06-14: 25 ug via INTRAVENOUS

## 2022-06-14 MED ORDER — LIDOCAINE HCL (CARDIAC) PF 100 MG/5ML IV SOSY
PREFILLED_SYRINGE | INTRAVENOUS | Status: DC | PRN
  Administered 2022-06-14: 100 mg via INTRAVENOUS

## 2022-06-14 MED ORDER — DEXAMETHASONE SODIUM PHOSPHATE 10 MG/ML IJ SOLN
INTRAMUSCULAR | Status: DC | PRN
  Administered 2022-06-14: 10 mg via INTRAVENOUS

## 2022-06-14 MED ORDER — ORAL CARE MOUTH RINSE: 15.0000 mL | Freq: Once | OROMUCOSAL | Status: AC

## 2022-06-14 MED ORDER — TRAMADOL HCL 50 MG PO TABS: 50.0000 mg | ORAL_TABLET | Freq: Four times a day (QID) | ORAL | 0 refills | Status: DC | PRN

## 2022-06-14 MED ORDER — PROPOFOL 10 MG/ML IV BOLUS
INTRAVENOUS | Status: DC | PRN
  Administered 2022-06-14: 140 mg via INTRAVENOUS

## 2022-06-14 MED ORDER — DEXAMETHASONE SODIUM PHOSPHATE 10 MG/ML IJ SOLN
INTRAMUSCULAR | Status: AC
  Filled 2022-06-14: qty 1

## 2022-06-14 MED ORDER — PROPOFOL 10 MG/ML IV BOLUS
INTRAVENOUS | Status: AC
  Filled 2022-06-14: qty 20

## 2022-06-14 MED ORDER — 0.9 % SODIUM CHLORIDE (POUR BTL) OPTIME
TOPICAL | Status: DC | PRN
  Administered 2022-06-14: 500 mL

## 2022-06-14 MED ORDER — CEFAZOLIN SODIUM-DEXTROSE 2-4 GM/100ML-% IV SOLN
INTRAVENOUS | Status: AC
  Filled 2022-06-14: qty 100

## 2022-06-14 MED ORDER — ONDANSETRON HCL 4 MG/2ML IJ SOLN
INTRAMUSCULAR | Status: DC | PRN
  Administered 2022-06-14: 4 mg via INTRAVENOUS

## 2022-06-14 MED ORDER — CHLORHEXIDINE GLUCONATE 0.12 % MT SOLN: 15.0000 mL | Freq: Once | OROMUCOSAL | Status: AC

## 2022-06-14 MED ORDER — CELECOXIB 200 MG PO CAPS: 200.0000 mg | ORAL_CAPSULE | ORAL | Status: DC

## 2022-06-14 MED ORDER — ACETAMINOPHEN 500 MG PO TABS
ORAL_TABLET | ORAL | Status: AC
  Administered 2022-06-14: 1000 mg via ORAL
  Filled 2022-06-14: qty 2

## 2022-06-14 MED ORDER — FENTANYL CITRATE (PF) 100 MCG/2ML IJ SOLN: 25.0000 ug | INTRAMUSCULAR | Status: DC | PRN

## 2022-06-14 SURGICAL SUPPLY — 28 items
ADH SKN CLS APL DERMABOND .7 (GAUZE/BANDAGES/DRESSINGS) ×1
APL PRP STRL LF DISP 70% ISPRP (MISCELLANEOUS) ×1
BLADE SURG 15 STRL LF DISP TIS (BLADE) ×1 IMPLANT
BLADE SURG 15 STRL SS (BLADE) ×1
CHLORAPREP W/TINT 26 (MISCELLANEOUS) ×1 IMPLANT
DERMABOND ADVANCED .7 DNX12 (GAUZE/BANDAGES/DRESSINGS) ×1 IMPLANT
DRAPE LAPAROTOMY 100X77 ABD (DRAPES) ×1 IMPLANT
ELECT REM PT RETURN 9FT ADLT (ELECTROSURGICAL) ×1
ELECTRODE REM PT RTRN 9FT ADLT (ELECTROSURGICAL) ×1 IMPLANT
GAUZE 4X4 16PLY ~~LOC~~+RFID DBL (SPONGE) ×1 IMPLANT
GLOVE BIO SURGEON STRL SZ7 (GLOVE) ×1 IMPLANT
GOWN STRL REUS W/ TWL LRG LVL3 (GOWN DISPOSABLE) ×2 IMPLANT
GOWN STRL REUS W/TWL LRG LVL3 (GOWN DISPOSABLE) ×2
KIT TURNOVER KIT A (KITS) ×1 IMPLANT
LABEL OR SOLS (LABEL) ×1 IMPLANT
MANIFOLD NEPTUNE II (INSTRUMENTS) ×1 IMPLANT
NEEDLE HYPO 22GX1.5 SAFETY (NEEDLE) ×1 IMPLANT
NS IRRIG 500ML POUR BTL (IV SOLUTION) ×1 IMPLANT
PACK BASIN MINOR ARMC (MISCELLANEOUS) ×1 IMPLANT
SPONGE T-LAP 18X18 ~~LOC~~+RFID (SPONGE) ×1 IMPLANT
SUT MNCRL 4-0 (SUTURE) ×1
SUT MNCRL 4-0 27XMFL (SUTURE) ×1
SUT VIC AB 0 CT1 36 (SUTURE) ×1 IMPLANT
SUT VIC AB 2-0 CT2 27 (SUTURE) ×1 IMPLANT
SUTURE MNCRL 4-0 27XMF (SUTURE) ×1 IMPLANT
SYR 10ML LL (SYRINGE) ×1 IMPLANT
TRAP FLUID SMOKE EVACUATOR (MISCELLANEOUS) ×1 IMPLANT
WATER STERILE IRR 500ML POUR (IV SOLUTION) ×1 IMPLANT

## 2022-06-14 NOTE — Op Note (Signed)
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   15 blade knife used to create an incision and the subcutaneous tissue was dissected free with hemostats.  The anterior fascia was incised and we visualized the lipoma. The mass was dissected free from adjacent structures using Metzenbaum scissors, mass was removed..  It was sent for permanent pathology.  Hemostasis obtained with pressure.  The wound was closed in a 2 layer fashion with dermal layer using interrupted 2-0 Vicryl.  The skin was closed in a subcuticular fashion using 4-0 Monocryl.  Dermabond was applied.  No complications. The  patient tolerated procedure well.

## 2022-06-14 NOTE — Interval H&P Note (Signed)
History and Physical Interval Note:  06/14/2022 7:23 AM  Shelly Freeman  has presented today for surgery, with the diagnosis of lipoma chest wall.  The various methods of treatment have been discussed with the patient and family. After consideration of risks, benefits and other options for treatment, the patient has consented to  Procedure(s): EXCISION LIPOMA, left chest wall (Left) as a surgical intervention.  The patient's history has been reviewed, patient examined, no change in status, stable for surgery.  I have reviewed the patient's chart and labs.  Questions were answered to the patient's satisfaction.     [place]

## 2022-06-14 NOTE — Anesthesia Procedure Notes (Signed)
Procedure Name: LMA Insertion Date/Time: 06/14/2022 7:51 AM  Performed by: Aline Brochure, CRNAPre-anesthesia Checklist: Patient identified, Patient being monitored, Timeout performed, Emergency Drugs available and Suction available Patient Re-evaluated:Patient Re-evaluated prior to induction Oxygen Delivery Method: Circle system utilized Preoxygenation: Pre-oxygenation with 100% oxygen Induction Type: IV induction Ventilation: Mask ventilation without difficulty LMA: LMA inserted LMA Size: 4.0 Tube type: Oral Number of attempts: 1 Placement Confirmation: positive ETCO2 and breath sounds checked- equal and bilateral Tube secured with: Tape Dental Injury: Teeth and Oropharynx as per pre-operative assessment

## 2022-06-14 NOTE — Patient Instructions (Signed)
Keep ice on the area until tomorrow.   Follow up here at 11 am with Dr Dahlia Byes.  Pain medication as needed.

## 2022-06-14 NOTE — Anesthesia Postprocedure Evaluation (Signed)
Anesthesia Post Note  Patient: Shelly Freeman  Procedure(s) Performed: EXCISION LIPOMA, left chest wall (Left)  Anesthetic complications: no   No notable events documented.   Last Vitals:  Vitals:   06/14/22 0834 06/14/22 0845  BP: (!) 146/72 (!) 141/63  Pulse: 73 70  Resp: (!) 23 17  Temp: (!) 36.1 C   SpO2: 99% 93%    Last Pain:  Vitals:   06/14/22 0834  TempSrc:   PainSc: [place] Takuma Cifelli

## 2022-06-14 NOTE — Discharge Instructions (Addendum)
Extraccin de un lipoma, cuidados posteriores Lipoma Removal, Care After La siguiente informacin ofrece orientacin sobre cmo cuidarse despus del procedimiento. El mdico tambin podr darle instrucciones ms especficas. Comunquese con el mdico si tiene problemas o preguntas. Qu puedo esperar despus del procedimiento? Despus del procedimiento, es normal tener los siguientes sntomas: Dolor leve. Hinchazn. [place]. Siga estas indicaciones en su casa: Baos  No tome baos de inmersin, no nade ni use el jacuzzi hasta que el mdico lo autorice. Pregntele al mdico si puede ducharse. Thurston Pounds solo le permitan darse baos de [place]. Mantenga la venda (vendaje) limpia y seca hasta que el mdico le diga que se la puede quitar. Cuidado de la incisin   Siga las instrucciones del mdico acerca del cuidado de la incisin. Asegrese de hacer lo siguiente: Lvese las manos con agua y jabn durante al menos 20 segundos antes y despus de [place]ar[place] manager el vendaje. Use desinfectante para manos si no dispone de [place] y [place]. Cambie el vendaje como se lo haya indicado el mdico. No retire los puntos (suturas), la goma para cerrar la piel o las tiras [place]. Es posible que estos cierres cutneos deban quedar puestos en la piel durante 2 semanas o ms tiempo. Si los bordes de las tiras adhesivas empiezan a despegarse y Therapist, sports, puede recortar los que estn sueltos. No retire las tiras [facility] por completo a menos que el mdico se lo indique. Controle la zona de la incisin todos los das para detectar signos de infeccin. Est atento a los siguientes signos: Aumento del enrojecimiento, la hinchazn o Conservation officer, historic buildings. Lquido o sangre. [place]. Pus o mal olor. Medicamentos Use los medicamentos de venta libre y los recetados solamente como se lo haya indicado el mdico. Si le recetaron un antibitico, tmelo como se lo haya indicado el mdico. No deje de usar el antibitico aunque comience a Consulting civil engineer. Indicaciones generales  Si le administraron un sedante durante el procedimiento, puede afectarlo por varias horas. No conduzca ni opere maquinaria hasta que el mdico le indique que es seguro [place]. No consuma ningn producto que contenga nicotina o tabaco antes del procedimiento. Estos productos incluyen cigarrillos, tabaco para Higher education careers adviser y aparatos de vapeo, como los Psychologist, sport and exercise. Estos pueden retrasar la cicatrizacin despus de la ciruga. Si necesita ayuda para dejar de consumir estos productos, consulte al [employer]. Retome sus actividades normales segn lo indicado por el mdico. Pregntele al mdico qu actividades son seguras para usted. Concurra a [place]. Esto es importante. Comunquese con un mdico si: Aumentan el enrojecimiento, la hinchazn o el dolor alrededor de su incisin. Presenta lquido o sangre provenientes de la incisin. La incisin est caliente al tacto. Tiene pus o percibe que sale mal olor de su incisin. Su dolor no se alivia con medicamentos. Solicite ayuda de inmediato si: Siente escalofros o tiene fiebre. Siente dolor intenso. Resumen Despus del procedimiento, es normal tener algo de dolor, hinchazn y [place]. Siga las instrucciones del mdico acerca del cuidado de la incisin. Comunquese con el mdico si tiene signos de infeccin, como un aumento del enrojecimiento, la hinchazn o Conservation officer, historic buildings. Esta informacin no tiene Marine scientist el consejo del mdico. Asegrese de hacerle al mdico cualquier pregunta que tenga.     CIRUGIA AMBULATORIA       Instruccionnes de alta    Date Toma Copier) 06/14/2022   1.  Las drogas que se Statistician en su cuerpo [employer], asi que por las proximas 24 horas usted  no debe:   Conducir Scientist, research (medical)) un automovil   Hacer ninguna decision legal   Tomar ninguna bebida alcoholica  2.  A) [place] puede comenzar una dieta regular.  Es mejor que hoy empiece con  liquidos y gradualmente anada comidas solidas.       B) Puede comer cualquier comida que desee pero es mejor empezar con liquidos, luego sopitas con galletas saladas y gradualmente llegar a las comidas solidas.  3.  Por favor avise a su medico inmediatamente si usted tiene algun sangrado anormal, tiene dificultad con la respiracion, enrojecimiento y Social research officer, government en el sitio de la cirugia, [place], fiebro o dolor que se alivia con [place].  4.  A) Su visita posoperatoria (despues de su operacion) es con el  Dr. Toney Rakes  Date 06/28/2022                    Time 1:45 pm        B)  Por favor llame para hacer la cita posoperatoria.  5.  Istrucciones especificas :

## 2022-06-14 NOTE — Progress Notes (Signed)
This RN spoke with granddaughter over the phone, [name], and went over discharge instructions. Patient granddaughter verified they will be notified when biopsy results are in, this RN confirmed that patient will be notified with results. Patient nor family had any other questions or concerns at this time. Patient and family verbally acknowledged understanding of d/c instructions. This RN also spoke with [facility] facility, Dr, Kieth Brightly, and went over discharge instructions as well.

## 2022-06-14 NOTE — Transfer of Care (Signed)
Immediate Anesthesia Transfer of Care Note  Patient: Shelly Freeman  Procedure(s) Performed: EXCISION LIPOMA, left chest wall (Left)  Patient Location: PACU  Anesthesia Type:General  Level of Consciousness: awake, alert , and oriented  Airway & Oxygen Therapy: Patient Spontanous Breathing and Patient connected to face mask oxygen  Post-op Assessment: Report given to RN and Post -op Vital signs reviewed and stable  Post vital signs: Reviewed and stable  Last Vitals:  Vitals Value Taken Time  BP 146/72   Temp    Pulse 73 06/14/22 0835  Resp 31 06/14/22 0835  SpO2 99 % 06/14/22 0835  Vitals shown include unvalidated device data.  Last Pain:  Vitals:   06/14/22 0700  TempSrc: Oral         Complications: No notable events documented.

## 2022-06-15 ENCOUNTER — Encounter: Payer: Self-pay | Admitting: Surgery

## 2022-06-15 ENCOUNTER — Ambulatory Visit (INDEPENDENT_AMBULATORY_CARE_PROVIDER_SITE_OTHER): Payer: Medicare (Managed Care) | Admitting: Surgery

## 2022-06-15 VITALS — BP 102/82 | HR 69 | Temp 97.7°F | Wt 218.0 lb

## 2022-06-15 DIAGNOSIS — Z09 Encounter for follow-up examination after completed treatment for conditions other than malignant neoplasm: Secondary | ICD-10-CM

## 2022-06-15 DIAGNOSIS — D171 Benign lipomatous neoplasm of skin and subcutaneous tissue of trunk: Secondary | ICD-10-CM

## 2022-06-15 LAB — SURGICAL PATHOLOGY

## 2022-06-15 MED ORDER — OXYCODONE-ACETAMINOPHEN 5-325 MG PO TABS: 1.0000 | ORAL_TABLET | ORAL | 0 refills | Status: AC | PRN

## 2022-06-15 NOTE — Progress Notes (Signed)
Is a 72 year old female that I did an excision of an anterior chest wall lipoma.  She did very well and went to distant living facility today.  They report that around 1-1 30 there was evidence of an expanding area in the surgical site.  She had increasing pain.  Tolan to come directly to the office. Vital signs have been stable.  She has been awake and alert but with persistent pain.  PE NAD BO ok There Is evidence of a large hematoma on the anterior chest wall on the left side.no infection Was able to prep and drape the left chest wall and using lidocaine 1% with epinephrine I use about 10 cc.  I removed Dermabond and was able to do an I&D of the large hematoma.  Multiple clots were evaluated.  Hemostasis was achieved with pressure.  There was an area that it was raw and I used a figure-of-eight 2-0 Vicryl.  There was not an active or pulsatile bleeding.  I loosely approximated the subcutaneous tissue with multiple 2-0 Vicryl's placed a pressure dressing.  There was no evidence of any further bleeding.  A/P Hematoma following excision.  I will see her back tomorrow.  No need for hospitalization transfusion or further interventions at this time.

## 2022-06-15 NOTE — Progress Notes (Signed)
Shelly Freeman is following after excision of chest wall lipoma complicated by hematoma that was you acquitted yesterday. She has significant pain and tramadol is not working. Good breakfast.  Signs have been stable.  PE NAD Well with dressing in place.  There is some serosanguineous drainage which is not unexpected.  No evidence of active bleeding no evidence of infection.  There is ecchymosis around the chest wall from the hematoma.  A/P some of the chest wall after lipoma excision.  For now we will continue as needed dressings.  I will see her back on Monday. For hospitalization at this time Switch medicines to Percocet since she has significant pain that can be seen with hematomas

## 2022-06-15 NOTE — Patient Instructions (Addendum)
Si tiene alguna inquietud o pregunta, no dude en llamar a nuestra oficina. Vea la cita de seguimiento a continuacin.  Hematoma Hematoma Un hematoma es una acumulacin de [place]. El hematoma puede producirse en los siguientes lugares: Debajo de la piel. En un rgano. En un espacio del cuerpo. En el espacio de Insurance claims handler. En otros tejidos. La sangre puede espesarse (coagularse) para formar un bulto que se puede ver y Quarry manager. El bulto suele ser duro y puede volverse doloroso y sensible al tacto. El bulto puede ser muy pequeo o [place]. La mayora de los hematomas mejoran en unos pocos das o [place]. Sin embargo, algunos pueden ser graves y Nurse, adult. Cules son las causas? Las causas de esta afeccin son: [name] lesin. La prdida de sangre debajo de la piel. Problemas debido a cirugas. Afecciones mdicas que causan sangrado o moretones. Qu incrementa el riesgo? Es ms probable que contraiga esta afeccin si: Es Media planner. Utiliza medicamentos que [employer]. [place] antiinflamatorios no esteroideos (AINE) con frecuencia, como ibuprofeno, para Best boy. Practica deportes de contacto. Cules son los signos o sntomas? Los sntomas dependen de la ubicacin que tiene el hematoma en el cuerpo. Si el hematoma se encuentra debajo de la piel, existe: Un bulto firme en el cuerpo. Dolor y sensibilidad en la zona. Moretones. La piel [employer] bulto puede ser de color [place], [place] oscuro, rojo prpura o Careers adviser. Si el hematoma est en un lugar profundo de los tejidos o espacios corporales, puede haber: Education officer, community. Esto puede causar dolor en el vientre (abdomen), debilidad, desmayos y falta de aire. Sangre dentro de la cabeza. Esto puede causar dolor de cabeza, debilidad, dificultad para hablar o comprender el habla, o desmayos. Cmo se trata? El tratamiento depende de la causa, del tamao y de la ubicacin del hematoma. El tratamiento  puede incluir: No hacer nada. Muchos hematomas desaparecen por s solos, sin tratamiento. Ciruga o control minucioso. Esto puede ser necesario para los hematomas grandes o los hematomas que afectan a los rganos del cuerpo. Medicamentos. Estos pueden administrarse si el hematoma se debe a Risk manager. Siga estas indicaciones en su casa: Control del dolor, la rigidez y la hinchazn  Aplique hielo sobre la zona lesionada si se lo indican. Para hacer esto: Ponga el hielo en una bolsa plstica. Coloque una toalla entre la piel y Therapist, nutritional. Deje el hielo durante 20 minutos, de 2 a 3 veces por da, durante los [employer]. Si la piel se le pone de color rojo brillante, quite el hielo de inmediato para evitar daos en la piel. El riesgo de dao en la piel es mayor si no puede sentir dolor, calor o fro. Aplique calor sobre la zona lesionada, si se lo indican. Hgalo con la frecuencia que le haya indicado el mdico. Use la fuente de calor que el mdico le recomiende, como una compresa de calor hmedo o una almohadilla trmica. Coloque una toalla entre la piel y la fuente de Freight forwarder. Aplique calor durante 20 a 30 minutos. Si la piel se le pone de color rojo brillante, retire Nurse, children's de inmediato para evitar quemaduras. El riesgo de quemaduras es mayor si no puede sentir el dolor, el calor o el fro. Cuando est sentado o acostado, mantenga la zona lesionada por encima del nivel del corazn. Envuelva la zona afectada con una venda elstica, si as se lo indic su mdico. No ajuste demasiado la venda. Si el hematoma  est en una pierna o en un pie y [employer], el mdico puede darle [place]. Utilcelas como se lo haya indicado el mdico. Indicaciones generales Use los medicamentos de venta libre y los recetados solamente como se lo haya indicado el mdico. Concurra a todas las visitas de seguimiento. Es posible que su mdico desee ver cmo est recuperndose el hematoma con el  tratamiento. Comunquese con un mdico si: Tiene fiebre. [place]. Comienzan a aparecerle ms hematomas. El dolor [place]. El dolor no mejora con medicamentos. La piel sobre el hematoma se abre o comienza a Therapist, art. Solicite ayuda de inmediato si: El hematoma est en su pecho o su vientre y usted: Se desmaya. Se siente dbil. Le falta el aire. Tiene un hematoma en el cuero cabelludo causado por una cada o una lesin, y usted: Tiene dolor de cabeza que [place]. Tiene dificultad para hablar o comprender el lenguaje. Le disminuye el nivel de alerta o se desmaya. Estos sntomas pueden Sales executive. Solicite ayuda de inmediato. Llame al 911. No espere a ver si los sntomas desaparecen. No conduzca por sus propios medios [employer]. Esta informacin no tiene Marine scientist el consejo del mdico. Asegrese de hacerle al mdico cualquier pregunta que tenga. Document Revised: 11/06/2021 Document Reviewed: 11/06/2021 Elsevier Patient Education  [place].

## 2022-06-18 ENCOUNTER — Ambulatory Visit (INDEPENDENT_AMBULATORY_CARE_PROVIDER_SITE_OTHER): Payer: Medicare (Managed Care) | Admitting: Surgery

## 2022-06-18 ENCOUNTER — Encounter: Payer: Self-pay | Admitting: Surgery

## 2022-06-18 ENCOUNTER — Other Ambulatory Visit: Payer: Self-pay

## 2022-06-18 VITALS — BP 106/80 | HR 78 | Temp 98.3°F | Ht 61.0 in | Wt 219.0 lb

## 2022-06-18 DIAGNOSIS — L7632 Postprocedural hematoma of skin and subcutaneous tissue following other procedure: Secondary | ICD-10-CM

## 2022-06-18 DIAGNOSIS — D171 Benign lipomatous neoplasm of skin and subcutaneous tissue of trunk: Secondary | ICD-10-CM

## 2022-06-18 DIAGNOSIS — Z09 Encounter for follow-up examination after completed treatment for conditions other than malignant neoplasm: Secondary | ICD-10-CM

## 2022-06-18 NOTE — Patient Instructions (Signed)
Please keep follow up appointment on 06/28/22.

## 2022-06-18 NOTE — Anesthesia Postprocedure Evaluation (Signed)
Anesthesia Post Note  Patient: Shelly Freeman  Procedure(s) Performed: EXCISION LIPOMA, left chest wall (Left)  Anesthetic complications: no   No notable events documented.   Last Vitals:  Vitals:   06/14/22 0923 06/14/22 1020  BP: (!) 144/82 (!) 135/95  Pulse: 72 67  Resp: 16 16  Temp: (!) 36.1 C   SpO2: 99%     Last Pain:  Vitals:   06/14/22 0923  TempSrc: Temporal  PainSc: [place] Mareli Antunes

## 2022-06-18 NOTE — Anesthesia Preprocedure Evaluation (Signed)
Anesthesia Evaluation  Patient identified by MRN, date of birth, ID band Patient awake    Reviewed: Allergy & Precautions, H&P , NPO status , Patient's Chart, lab work & pertinent test results, reviewed documented beta blocker date and time   Airway Mallampati: II   Neck ROM: full    Dental  (+) Poor Dentition   Pulmonary shortness of breath and with exertion, sleep apnea , pneumonia, resolved, COPD,  COPD inhaler   Pulmonary exam normal        Cardiovascular Exercise Tolerance: Poor hypertension, On Medications + angina with exertion Normal cardiovascular exam Rhythm:regular Rate:Normal     Neuro/Psych  PSYCHIATRIC DISORDERS Anxiety Depression   Dementia  Neuromuscular disease    GI/Hepatic Neg liver ROS,GERD  Medicated,,  Endo/Other  Hypothyroidism    Renal/GU Renal disease  negative genitourinary   Musculoskeletal   Abdominal   Peds  Hematology  (+) Blood dyscrasia, anemia   Anesthesia Other Findings Past Medical History: No date: Anginal pain (HCC) No date: Arthritis No date: Collagen vascular disease (HCC) No date: COPD (chronic obstructive pulmonary disease) ([place]) 05/14/2016: DDD (degenerative disc disease), lumbar No date: Depression No date: Dizziness No date: Dyspnea No date: GERD (gastroesophageal reflux disease) No date: Heart valve problem     Comment:  per grandaughter need surgery but they told her may not               survive; No date: History of kidney stones No date: Hypertension No date: Hypothyroidism No date: Lupus ([place]) No date: Neuromuscular disorder ([place]) No date: Sleep apnea     Comment:  no CPAP No date: Thyroid disease 05/14/2016: Vitamin D deficiency Past Surgical History: No date: APPENDECTOMY No date: BREAST BIOPSY; Left     Comment:  neg 11/13/2021: CATARACT EXTRACTION W/PHACO; Left     Comment:  Procedure: CATARACT EXTRACTION PHACO AND INTRAOCULAR               LENS  PLACEMENT (IOC) LEFT 3.63 00:35.9;  Surgeon: Eulogio Bear, MD;  Location: [place];                Service: Ophthalmology;  Laterality: Left; 12/04/2021: CATARACT EXTRACTION W/PHACO; Right     Comment:  Procedure: CATARACT EXTRACTION PHACO AND INTRAOCULAR               LENS PLACEMENT (IOC) RIGHT;  Surgeon: Eulogio Bear,              MD;  Location: [place];  Service:               Ophthalmology;  Laterality: Right;  3.11 00:32.6 No date: CESAREAN SECTION     Comment:  x 2 08/24/2021: COLONOSCOPY; N/A     Comment:  Procedure: COLONOSCOPY;  Surgeon: [name],              DO;  Location: [facility] ENDOSCOPY;  Service:               Gastroenterology;  Laterality: N/A;  SPANISH INTERPRETER 07/01/2017: COLONOSCOPY WITH PROPOFOL; N/A     Comment:  Procedure: COLONOSCOPY WITH PROPOFOL;  Surgeon: Manya Silvas, MD;  [facility];  Service:               Endoscopy;  Laterality: N/A; 07/01/2017: ESOPHAGOGASTRODUODENOSCOPY (EGD)  WITH PROPOFOL; N/A     Comment:  Procedure: ESOPHAGOGASTRODUODENOSCOPY (EGD) WITH               PROPOFOL;  Surgeon: Manya Silvas, MD;  Location:               [facility] ENDOSCOPY;  Service: Endoscopy;  Laterality: N/A; No date: GASTRIC BYPASS 06/14/2022: LIPOMA EXCISION; Left     Comment:  Procedure: EXCISION LIPOMA, left chest wall;  Surgeon:               Jules Husbands, MD;  Location: [facility] ORS;  Service:               General;  Laterality: Left; No date: LITHOTRIPSY 10/25/2020: TOTAL KNEE ARTHROPLASTY; Left     Comment:  Procedure: TOTAL KNEE ARTHROPLASTY;  Surgeon: Hessie Knows, MD;  Location: [facility] ORS;  Service: Orthopedics;               Laterality: Left; No date: TUBAL LIGATION BMI    Body Mass Index: 41.28 kg/m     Reproductive/Obstetrics negative OB ROS                             Anesthesia Physical Anesthesia Plan  ASA:  3  Anesthesia Plan: General and General LMA   Post-op Pain Management:    Induction:   PONV Risk Score and Plan:   Airway Management Planned:   Additional Equipment:   Intra-op Plan:   Post-operative Plan:   Informed Consent: I have reviewed the patients History and Physical, chart, labs and discussed the procedure including the risks, benefits and alternatives for the proposed anesthesia with the patient or authorized representative who has indicated his/her understanding and acceptance.     Dental Advisory Given  Plan Discussed with: CRNA  Anesthesia Plan Comments:        Anesthesia Quick Evaluation

## 2022-06-18 NOTE — Progress Notes (Signed)
S/p chest wall lipoma excision complicated by hematoma.  Doing well.  No fevers no chills.  Pain is much better.  No more bleeding.  PE NAD Healing well without infection.  There is ecchymosis on the left chest wall.  Evidence of hematomas or active bleeding  A/P doing much better.  No issues at this time.  Continue follow-up in a week or 2.

## 2022-06-28 ENCOUNTER — Encounter: Payer: Self-pay | Admitting: Physician Assistant

## 2022-06-28 ENCOUNTER — Other Ambulatory Visit: Payer: Self-pay

## 2022-06-28 ENCOUNTER — Ambulatory Visit (INDEPENDENT_AMBULATORY_CARE_PROVIDER_SITE_OTHER): Payer: Medicare (Managed Care) | Admitting: Physician Assistant

## 2022-06-28 VITALS — BP 103/50 | HR 64 | Temp 97.8°F | Ht 61.0 in | Wt 219.0 lb

## 2022-06-28 DIAGNOSIS — Z09 Encounter for follow-up examination after completed treatment for conditions other than malignant neoplasm: Secondary | ICD-10-CM

## 2022-06-28 DIAGNOSIS — D171 Benign lipomatous neoplasm of skin and subcutaneous tissue of trunk: Secondary | ICD-10-CM

## 2022-06-28 NOTE — Patient Instructions (Signed)

## 2022-06-28 NOTE — Progress Notes (Signed)
[facility] POST-OP OFFICE VISIT  06/28/2022  HPI: Shelly Freeman is a 72 y.o. female 14 days s/p Excision of 15x 10 cm left chest wall soft tissue mass with Dr Dahlia Byes which was complicated by post-op hematoma.   She presents today for follow up Wound is healing well Minimal soreness; much improved No fever, chills Wound is no longer swollen, no evidence of residual hematoma nor infection   Vital signs: BP (!) 103/50   Pulse 64   Temp 97.8 F (36.6 C) (Oral)   Ht 5' 1"$  (1.549 m)   Wt 219 lb (99.3 kg)   SpO2 94%   BMI 41.38 kg/m    Physical Exam: Constitutional: Well appearing female, NAD Skin: Healing incision to the left upper chest wall; dermabond is clumping and falling off, there is scant healing ecchymosis, no erythema, no swelling, no drainage   Assessment/Plan: This is a 72 y.o. female 14 days s/p Excision of 15x 10 cm left chest wall soft tissue mass with Dr Dahlia Byes which was complicated by post-op hematoma.    - Nothing further from surgical perspective; wound continues to heal well without issue. No further evidence of hematoma nor swelling/drainage  - She can follow up on as needed basis; She understands to call with questions/concerns  -- Edison Simon, PA-C [facility] 06/28/2022, 2:17 PM M-F: 7am - 4pm

## 2022-07-20 ENCOUNTER — Encounter: Payer: Self-pay | Admitting: Emergency Medicine

## 2022-07-20 ENCOUNTER — Emergency Department: Payer: Medicare (Managed Care)

## 2022-07-20 ENCOUNTER — Other Ambulatory Visit: Payer: Self-pay

## 2022-07-20 ENCOUNTER — Inpatient Hospital Stay
Admission: EM | Admit: 2022-07-20 | Discharge: 2022-07-26 | DRG: 871 | Disposition: A | Discharge status: Home / Self Care | Payer: Medicare (Managed Care) | Attending: Internal Medicine | Admitting: Internal Medicine

## 2022-07-20 DIAGNOSIS — K920 Hematemesis: Secondary | ICD-10-CM

## 2022-07-20 DIAGNOSIS — K529 Noninfective gastroenteritis and colitis, unspecified: Secondary | ICD-10-CM | POA: Diagnosis present

## 2022-07-20 DIAGNOSIS — R6520 Severe sepsis without septic shock: Secondary | ICD-10-CM | POA: Diagnosis present

## 2022-07-20 DIAGNOSIS — N12 Tubulo-interstitial nephritis, not specified as acute or chronic: Secondary | ICD-10-CM | POA: Diagnosis not present

## 2022-07-20 DIAGNOSIS — I10 Essential (primary) hypertension: Secondary | ICD-10-CM | POA: Diagnosis present

## 2022-07-20 DIAGNOSIS — N179 Acute kidney failure, unspecified: Secondary | ICD-10-CM | POA: Diagnosis present

## 2022-07-20 DIAGNOSIS — E039 Hypothyroidism, unspecified: Secondary | ICD-10-CM | POA: Diagnosis present

## 2022-07-20 DIAGNOSIS — R1110 Vomiting, unspecified: Secondary | ICD-10-CM

## 2022-07-20 DIAGNOSIS — Z1152 Encounter for screening for COVID-19: Secondary | ICD-10-CM | POA: Diagnosis not present

## 2022-07-20 DIAGNOSIS — N170 Acute kidney failure with tubular necrosis: Secondary | ICD-10-CM | POA: Diagnosis present

## 2022-07-20 DIAGNOSIS — G4730 Sleep apnea, unspecified: Secondary | ICD-10-CM | POA: Diagnosis present

## 2022-07-20 DIAGNOSIS — K226 Gastro-esophageal laceration-hemorrhage syndrome: Secondary | ICD-10-CM | POA: Diagnosis present

## 2022-07-20 DIAGNOSIS — B9620 Unspecified Escherichia coli [E. coli] as the cause of diseases classified elsewhere: Secondary | ICD-10-CM | POA: Diagnosis present

## 2022-07-20 DIAGNOSIS — J449 Chronic obstructive pulmonary disease, unspecified: Secondary | ICD-10-CM | POA: Diagnosis present

## 2022-07-20 DIAGNOSIS — D62 Acute posthemorrhagic anemia: Secondary | ICD-10-CM | POA: Diagnosis present

## 2022-07-20 DIAGNOSIS — A498 Other bacterial infections of unspecified site: Secondary | ICD-10-CM | POA: Diagnosis not present

## 2022-07-20 DIAGNOSIS — D696 Thrombocytopenia, unspecified: Secondary | ICD-10-CM | POA: Diagnosis present

## 2022-07-20 DIAGNOSIS — M329 Systemic lupus erythematosus, unspecified: Secondary | ICD-10-CM | POA: Diagnosis present

## 2022-07-20 DIAGNOSIS — I998 Other disorder of circulatory system: Secondary | ICD-10-CM | POA: Diagnosis present

## 2022-07-20 DIAGNOSIS — N390 Urinary tract infection, site not specified: Secondary | ICD-10-CM | POA: Diagnosis not present

## 2022-07-20 DIAGNOSIS — Z9884 Bariatric surgery status: Secondary | ICD-10-CM

## 2022-07-20 DIAGNOSIS — M1990 Unspecified osteoarthritis, unspecified site: Secondary | ICD-10-CM | POA: Diagnosis present

## 2022-07-20 DIAGNOSIS — Z79890 Hormone replacement therapy (postmenopausal): Secondary | ICD-10-CM

## 2022-07-20 DIAGNOSIS — Z90710 Acquired absence of both cervix and uterus: Secondary | ICD-10-CM

## 2022-07-20 DIAGNOSIS — K219 Gastro-esophageal reflux disease without esophagitis: Secondary | ICD-10-CM | POA: Diagnosis present

## 2022-07-20 DIAGNOSIS — Z87440 Personal history of urinary (tract) infections: Secondary | ICD-10-CM

## 2022-07-20 DIAGNOSIS — N10 Acute tubulo-interstitial nephritis: Secondary | ICD-10-CM | POA: Diagnosis present

## 2022-07-20 DIAGNOSIS — Z6841 Body Mass Index (BMI) 40.0 and over, adult: Secondary | ICD-10-CM | POA: Diagnosis not present

## 2022-07-20 DIAGNOSIS — Z87442 Personal history of urinary calculi: Secondary | ICD-10-CM

## 2022-07-20 DIAGNOSIS — Z1612 Extended spectrum beta lactamase (ESBL) resistance: Secondary | ICD-10-CM | POA: Diagnosis present

## 2022-07-20 DIAGNOSIS — F333 Major depressive disorder, recurrent, severe with psychotic symptoms: Secondary | ICD-10-CM | POA: Diagnosis present

## 2022-07-20 DIAGNOSIS — R197 Diarrhea, unspecified: Secondary | ICD-10-CM

## 2022-07-20 DIAGNOSIS — A419 Sepsis, unspecified organism: Principal | ICD-10-CM | POA: Diagnosis present

## 2022-07-20 DIAGNOSIS — J9601 Acute respiratory failure with hypoxia: Secondary | ICD-10-CM | POA: Diagnosis present

## 2022-07-20 DIAGNOSIS — F0392 Unspecified dementia, unspecified severity, with psychotic disturbance: Secondary | ICD-10-CM | POA: Diagnosis present

## 2022-07-20 DIAGNOSIS — Z96652 Presence of left artificial knee joint: Secondary | ICD-10-CM | POA: Diagnosis present

## 2022-07-20 DIAGNOSIS — Z961 Presence of intraocular lens: Secondary | ICD-10-CM | POA: Diagnosis present

## 2022-07-20 DIAGNOSIS — Z79899 Other long term (current) drug therapy: Secondary | ICD-10-CM

## 2022-07-20 DIAGNOSIS — Z7983 Long term (current) use of bisphosphonates: Secondary | ICD-10-CM

## 2022-07-20 LAB — URINALYSIS, ROUTINE W REFLEX MICROSCOPIC
Bilirubin Urine: NEGATIVE
Glucose, UA: NEGATIVE mg/dL
Ketones, ur: NEGATIVE mg/dL
Nitrite: POSITIVE — AB
Protein, ur: 100 mg/dL — AB
Specific Gravity, Urine: 1.023 (ref 1.005–1.030)
WBC, UA: >50 WBC/hpf (ref 0–5)
pH: 6 (ref 5.0–8.0)

## 2022-07-20 LAB — CBC
HCT: 37.1 % (ref 36.0–46.0)
HCT: 37.1 % (ref 36.0–46.0)
Hemoglobin: 11.6 g/dL — ABNORMAL LOW (ref 12.0–15.0)
Hemoglobin: 11.5 g/dL — ABNORMAL LOW (ref 12.0–15.0)
MCH: 32.3 pg (ref 26.0–34.0)
MCH: 31.9 pg (ref 26.0–34.0)
MCHC: 31.3 g/dL (ref 30.0–36.0)
MCHC: 31 g/dL (ref 30.0–36.0)
MCV: 103.3 fL — ABNORMAL HIGH (ref 80.0–100.0)
MCV: 102.8 fL — ABNORMAL HIGH (ref 80.0–100.0)
Platelets: 169 10*3/uL (ref 150–400)
Platelets: 131 10*3/uL — ABNORMAL LOW (ref 150–400)
RBC: 3.59 MIL/uL — ABNORMAL LOW (ref 3.87–5.11)
RBC: 3.61 MIL/uL — ABNORMAL LOW (ref 3.87–5.11)
RDW: 13.2 % (ref 11.5–15.5)
RDW: 13.2 % (ref 11.5–15.5)
WBC: 21.4 10*3/uL — ABNORMAL HIGH (ref 4.0–10.5)
WBC: 18.9 10*3/uL — ABNORMAL HIGH (ref 4.0–10.5)
nRBC: 0 % (ref 0.0–0.2)
nRBC: 0 % (ref 0.0–0.2)

## 2022-07-20 LAB — COMPREHENSIVE METABOLIC PANEL
ALT: 17 U/L (ref 0–44)
AST: 35 U/L (ref 15–41)
Albumin: 2.9 g/dL — ABNORMAL LOW (ref 3.5–5.0)
Alkaline Phosphatase: 76 U/L (ref 38–126)
Anion gap: 14 (ref 5–15)
BUN: 24 mg/dL — ABNORMAL HIGH (ref 8–23)
CO2: 20 mmol/L — ABNORMAL LOW (ref 22–32)
Calcium: 8.5 mg/dL — ABNORMAL LOW (ref 8.9–10.3)
Chloride: 99 mmol/L (ref 98–111)
Creatinine, Ser: 1.35 mg/dL — ABNORMAL HIGH (ref 0.44–1.00)
GFR, Estimated: 42 mL/min — ABNORMAL LOW (ref >60)
Glucose, Bld: 154 mg/dL — ABNORMAL HIGH (ref 70–99)
Potassium: 3.9 mmol/L (ref 3.5–5.1)
Sodium: 133 mmol/L — ABNORMAL LOW (ref 135–145)
Total Bilirubin: 1 mg/dL (ref 0.3–1.2)
Total Protein: 6.5 g/dL (ref 6.5–8.1)

## 2022-07-20 LAB — RESP PANEL BY RT-PCR (RSV, FLU A&B, COVID)  RVPGX2
Influenza A by PCR: NEGATIVE
Influenza B by PCR: NEGATIVE
Resp Syncytial Virus by PCR: NEGATIVE
SARS Coronavirus 2 by RT PCR: NEGATIVE

## 2022-07-20 LAB — LACTIC ACID, PLASMA
Lactic Acid, Venous: 4.4 mmol/L (ref 0.5–1.9)
Lactic Acid, Venous: 2.3 mmol/L (ref 0.5–1.9)

## 2022-07-20 LAB — TROPONIN I (HIGH SENSITIVITY)
Troponin I (High Sensitivity): 8 ng/L (ref <18)
Troponin I (High Sensitivity): 9 ng/L (ref <18)

## 2022-07-20 LAB — LIPASE, BLOOD: Lipase: 26 U/L (ref 11–51)

## 2022-07-20 LAB — BRAIN NATRIURETIC PEPTIDE: B Natriuretic Peptide: 1248.7 pg/mL — ABNORMAL HIGH (ref 0.0–100.0)

## 2022-07-20 MED ORDER — ONDANSETRON HCL 4 MG/2ML IJ SOLN
4.0000 mg | Freq: Four times a day (QID) | INTRAMUSCULAR | Status: DC | PRN
  Administered 2022-07-22: 4 mg via INTRAVENOUS
  Filled 2022-07-20: qty 2

## 2022-07-20 MED ORDER — IPRATROPIUM-ALBUTEROL 0.5-2.5 (3) MG/3ML IN SOLN: 3.0000 mL | RESPIRATORY_TRACT | Status: DC | PRN

## 2022-07-20 MED ORDER — ESCITALOPRAM OXALATE 10 MG PO TABS
20.0000 mg | ORAL_TABLET | Freq: Every day | ORAL | Status: DC
  Administered 2022-07-21 – 2022-07-26 (×6): 20 mg via ORAL
  Filled 2022-07-20 (×6): qty 2

## 2022-07-20 MED ORDER — FAMOTIDINE 20 MG PO TABS: 20.0000 mg | ORAL_TABLET | Freq: Two times a day (BID) | ORAL | Status: DC

## 2022-07-20 MED ORDER — SODIUM CHLORIDE 0.9 % IV BOLUS: 1000.0000 mL | Freq: Once | INTRAVENOUS | Status: DC

## 2022-07-20 MED ORDER — TRAZODONE HCL 50 MG PO TABS
125.0000 mg | ORAL_TABLET | Freq: Every evening | ORAL | Status: DC | PRN
  Administered 2022-07-20 – 2022-07-25 (×5): 125 mg via ORAL
  Filled 2022-07-20 (×5): qty 1

## 2022-07-20 MED ORDER — ACETAMINOPHEN 650 MG RE SUPP
650.0000 mg | Freq: Four times a day (QID) | RECTAL | Status: DC | PRN
  Administered 2022-07-21: 650 mg via RECTAL
  Filled 2022-07-20: qty 1

## 2022-07-20 MED ORDER — VANCOMYCIN HCL IN DEXTROSE 1-5 GM/200ML-% IV SOLN: 1000.0000 mg | Freq: Once | INTRAVENOUS | Status: DC

## 2022-07-20 MED ORDER — SODIUM CHLORIDE 0.9 % IV SOLN
2.0000 g | Freq: Once | INTRAVENOUS | Status: AC
  Administered 2022-07-20: 2 g via INTRAVENOUS
  Filled 2022-07-20: qty 12.5

## 2022-07-20 MED ORDER — FOLIC ACID 1 MG PO TABS
1.0000 mg | ORAL_TABLET | Freq: Every day | ORAL | Status: DC
  Administered 2022-07-21 – 2022-07-26 (×6): 1 mg via ORAL
  Filled 2022-07-20 (×6): qty 1

## 2022-07-20 MED ORDER — LORATADINE 10 MG PO TABS
10.0000 mg | ORAL_TABLET | Freq: Every day | ORAL | Status: DC
  Administered 2022-07-21 – 2022-07-26 (×6): 10 mg via ORAL
  Filled 2022-07-20 (×6): qty 1

## 2022-07-20 MED ORDER — QUETIAPINE FUMARATE 25 MG PO TABS
50.0000 mg | ORAL_TABLET | Freq: Every day | ORAL | Status: DC
  Administered 2022-07-21 – 2022-07-25 (×5): 50 mg via ORAL
  Filled 2022-07-20 (×5): qty 2

## 2022-07-20 MED ORDER — GABAPENTIN 300 MG PO CAPS
300.0000 mg | ORAL_CAPSULE | Freq: Three times a day (TID) | ORAL | Status: DC
  Administered 2022-07-20 – 2022-07-26 (×17): 300 mg via ORAL
  Filled 2022-07-20 (×17): qty 1

## 2022-07-20 MED ORDER — METRONIDAZOLE 500 MG/100ML IV SOLN
500.0000 mg | Freq: Once | INTRAVENOUS | Status: AC
  Administered 2022-07-20: 500 mg via INTRAVENOUS
  Filled 2022-07-20: qty 100

## 2022-07-20 MED ORDER — IOHEXOL 300 MG/ML  SOLN
80.0000 mL | Freq: Once | INTRAMUSCULAR | Status: AC | PRN
  Administered 2022-07-20: 80 mL via INTRAVENOUS

## 2022-07-20 MED ORDER — PANTOPRAZOLE SODIUM 40 MG IV SOLR
40.0000 mg | Freq: Two times a day (BID) | INTRAVENOUS | Status: DC
  Administered 2022-07-20 – 2022-07-22 (×4): 40 mg via INTRAVENOUS
  Filled 2022-07-20 (×4): qty 10

## 2022-07-20 MED ORDER — ENOXAPARIN SODIUM 60 MG/0.6ML IJ SOSY
0.5000 mg/kg | PREFILLED_SYRINGE | INTRAMUSCULAR | Status: DC
  Administered 2022-07-20 – 2022-07-25 (×6): 50 mg via SUBCUTANEOUS
  Filled 2022-07-20 (×6): qty 0.6

## 2022-07-20 MED ORDER — SODIUM CHLORIDE 0.9 % IV SOLN
2.0000 g | INTRAVENOUS | Status: DC
  Administered 2022-07-20 – 2022-07-21 (×2): 2 g via INTRAVENOUS
  Filled 2022-07-20 (×2): qty 20

## 2022-07-20 MED ORDER — LACTATED RINGERS IV SOLN: INTRAVENOUS | Status: AC

## 2022-07-20 MED ORDER — OXYBUTYNIN CHLORIDE ER 5 MG PO TB24
5.0000 mg | ORAL_TABLET | Freq: Every day | ORAL | Status: DC
  Administered 2022-07-20 – 2022-07-25 (×6): 5 mg via ORAL
  Filled 2022-07-20 (×6): qty 1

## 2022-07-20 MED ORDER — BUSPIRONE HCL 10 MG PO TABS
15.0000 mg | ORAL_TABLET | Freq: Two times a day (BID) | ORAL | Status: DC
  Administered 2022-07-20 – 2022-07-26 (×12): 15 mg via ORAL
  Filled 2022-07-20 (×13): qty 2

## 2022-07-20 MED ORDER — DIPHENHYDRAMINE HCL 12.5 MG/5ML PO ELIX
12.5000 mg | ORAL_SOLUTION | Freq: Once | ORAL | Status: AC
  Administered 2022-07-20: 12.5 mg via ORAL
  Filled 2022-07-20: qty 5

## 2022-07-20 MED ORDER — SODIUM CHLORIDE 0.9 % IV BOLUS
1000.0000 mL | Freq: Once | INTRAVENOUS | Status: AC
  Administered 2022-07-20: 1000 mL via INTRAVENOUS

## 2022-07-20 MED ORDER — ACETAMINOPHEN 325 MG PO TABS
650.0000 mg | ORAL_TABLET | Freq: Four times a day (QID) | ORAL | Status: DC | PRN
  Administered 2022-07-20: 650 mg via ORAL
  Filled 2022-07-20: qty 2

## 2022-07-20 MED ORDER — HYDROMORPHONE HCL 1 MG/ML IJ SOLN
0.5000 mg | INTRAMUSCULAR | Status: DC | PRN
  Administered 2022-07-20 – 2022-07-22 (×7): 0.5 mg via INTRAVENOUS
  Filled 2022-07-20 (×7): qty 1

## 2022-07-20 MED ORDER — SODIUM CHLORIDE 0.9% FLUSH
3.0000 mL | Freq: Two times a day (BID) | INTRAVENOUS | Status: DC
  Administered 2022-07-20 – 2022-07-26 (×12): 3 mL via INTRAVENOUS

## 2022-07-20 MED ORDER — LACTATED RINGERS IV BOLUS
1000.0000 mL | Freq: Once | INTRAVENOUS | Status: AC
  Administered 2022-07-20: 1000 mL via INTRAVENOUS

## 2022-07-20 MED ORDER — ONDANSETRON HCL 4 MG/2ML IJ SOLN
4.0000 mg | Freq: Once | INTRAMUSCULAR | Status: AC
  Administered 2022-07-20: 4 mg via INTRAVENOUS
  Filled 2022-07-20: qty 2

## 2022-07-20 MED ORDER — FENTANYL CITRATE PF 50 MCG/ML IJ SOSY
50.0000 ug | PREFILLED_SYRINGE | Freq: Once | INTRAMUSCULAR | Status: AC
  Administered 2022-07-20: 50 ug via INTRAVENOUS
  Filled 2022-07-20: qty 1

## 2022-07-20 MED ORDER — ONDANSETRON HCL 4 MG PO TABS: 4.0000 mg | ORAL_TABLET | Freq: Four times a day (QID) | ORAL | Status: DC | PRN

## 2022-07-20 MED ORDER — METOPROLOL SUCCINATE ER 25 MG PO TB24
25.0000 mg | ORAL_TABLET | Freq: Every day | ORAL | Status: DC
  Administered 2022-07-21: 25 mg via ORAL
  Filled 2022-07-20: qty 1

## 2022-07-20 MED ORDER — LEVOTHYROXINE SODIUM 50 MCG PO TABS
75.0000 ug | ORAL_TABLET | Freq: Every day | ORAL | Status: DC
  Administered 2022-07-21 – 2022-07-26 (×6): 75 ug via ORAL
  Filled 2022-07-20: qty 2
  Filled 2022-07-20 (×2): qty 1
  Filled 2022-07-20: qty 2
  Filled 2022-07-20 (×2): qty 1

## 2022-07-20 NOTE — Consult Note (Signed)
PHARMACY -  BRIEF ANTIBIOTIC NOTE   Pharmacy has received consult(s) for Vancomycin and Cefepime from an ED provider.  The patient's profile has been reviewed for ht/wt/allergies/indication/available labs.    One time order(s) placed for Vancomycin 1g IV and Cefepime 2g IV x 1 dose each.  Further antibiotics/pharmacy consults should be ordered by admitting physician if indicated.                       Thank you, Pearla Dubonnet 07/20/2022  2:05 PM

## 2022-07-20 NOTE — Assessment & Plan Note (Signed)
Per chart review, patient has a history of recurrent UTI and pyelonephritis in the past with most cultures growing pansensitive E. coli, but occasionally has had Enterobacter with multiple resistances.  - Urine culture pending - S/p cefepime - Transition to ceftriaxone 2 g daily - Dilaudid for pain control

## 2022-07-20 NOTE — Assessment & Plan Note (Addendum)
Patient states her vomit was initially normal but then turned bright red after numerous episodes.  Most consistent with Mallory-Weiss tear.  No additional episodes since arrival to the ED. Hemoglobin is slightly decreased compared to prior but this could be due to acute illness.   - Hold off on GI consultation unless additional episodes should occur or hemoglobin continues to decrease - Recheck CBC this PM - Protonix twice daily

## 2022-07-20 NOTE — Assessment & Plan Note (Signed)
No wheezing on examination and patient declines cough.  -Continue home bronchodilators

## 2022-07-20 NOTE — Sepsis Progress Note (Signed)
Sepsis protocol monitored by eLink ?

## 2022-07-20 NOTE — Assessment & Plan Note (Addendum)
Potentially due to underlying infection and sepsis.  No additional episodes on arrival to the ED.    - Zofran as needed for nausea

## 2022-07-20 NOTE — Assessment & Plan Note (Signed)
Not currently on any DMARDs or immunosuppressants

## 2022-07-20 NOTE — ED Notes (Signed)
Admitting MD states that she is going to discontinue vancomycin

## 2022-07-20 NOTE — ED Provider Notes (Signed)
[facility] Provider Note    Event Date/Time   First MD Initiated Contact with Patient 07/20/22 1146     (approximate)   History   Diarrhea   HPI  Shelly Freeman is a 72 y.o. female with history of dementia, COPD, hypertension, GERD, here with diarrhea, nausea, shortness of breath.  The patient arrives confused, unable to provide much history.  Per report, the patient has had diarrhea, vomiting, and increased work of breathing.  This started last night around 1 AM.  She is seen by the [facility], and had a sat taken at home that was 85%.  She was subsequently placed on oxygen and brought here.  She has had multiple episodes of diarrhea and vomiting today.  She is unable to provide additional history my assessment.     Physical Exam   Triage Vital Signs: ED Triage Vitals  Enc Vitals Group     BP 07/20/22 1153 (!) 153/62     Pulse Rate 07/20/22 1153 88     Resp 07/20/22 1153 18     Temp 07/20/22 1153 98.4 F (36.9 C)     Temp Source 07/20/22 1153 Oral     SpO2 07/20/22 1153 100 %     Weight --      Height --      Head Circumference --      Peak Flow --      Pain Score 07/20/22 1150 10     Pain Loc --      Pain Edu? --      Excl. in [place]? --     Most recent vital signs: Vitals:   07/20/22 1751 07/20/22 1947  BP: 109/61 (!) 116/42  Pulse: 90 100  Resp: 20 17  Temp: 98.1 F (36.7 C) 98.4 F (36.9 C)  SpO2: 97% 97%     General: Awake, no distress.  CV:  Good peripheral perfusion.  Regular rate and rhythm. Resp:  Moderate tachypnea, on nasal cannula.  Breath sounds diminished but overall symmetric. Abd:  No distention.  No significant tenderness.  No rebound guarding. Other:  Mildly dry mucous membranes.   ED Results / Procedures / Treatments   Labs (all labs ordered are listed, but only abnormal results are displayed) Labs Reviewed  COMPREHENSIVE METABOLIC PANEL - Abnormal; Notable for the following components:      Result Value    Sodium 133 (*)    CO2 20 (*)    Glucose, Bld 154 (*)    BUN 24 (*)    Creatinine, Ser 1.35 (*)    Calcium 8.5 (*)    Albumin 2.9 (*)    GFR, Estimated 42 (*)    All other components within normal limits  CBC - Abnormal; Notable for the following components:   WBC 21.4 (*)    RBC 3.59 (*)    Hemoglobin 11.6 (*)    MCV 103.3 (*)    All other components within normal limits  URINALYSIS, ROUTINE W REFLEX MICROSCOPIC - Abnormal; Notable for the following components:   Color, Urine AMBER (*)    APPearance CLOUDY (*)    Hgb urine dipstick SMALL (*)    Protein, ur 100 (*)    Nitrite POSITIVE (*)    Leukocytes,Ua LARGE (*)    Bacteria, UA MANY (*)    All other components within normal limits  BRAIN NATRIURETIC PEPTIDE - Abnormal; Notable for the following components:   B Natriuretic Peptide 1,248.7 (*)  All other components within normal limits  LACTIC ACID, PLASMA - Abnormal; Notable for the following components:   Lactic Acid, Venous 4.4 (*)    All other components within normal limits  LACTIC ACID, PLASMA - Abnormal; Notable for the following components:   Lactic Acid, Venous 2.3 (*)    All other components within normal limits  RESP PANEL BY RT-PCR (RSV, FLU A&B, COVID)  [id number]  CULTURE, BLOOD (ROUTINE X 2)  CULTURE, BLOOD (ROUTINE X 2)  URINE CULTURE  LIPASE, BLOOD  CBC  HIV ANTIBODY (ROUTINE TESTING W REFLEX)  CBC  BASIC METABOLIC PANEL  TROPONIN I (HIGH SENSITIVITY)  TROPONIN I (HIGH SENSITIVITY)     EKG Normal sinus rhythm, ventricular rate 89.  Significant baseline artifact.  PR 146, QRS 91, QTc 421.  No acute ST elevations or depressions.   RADIOLOGY Chest x-ray: No active disease   I also independently reviewed and agree with radiologist interpretations.   PROCEDURES:  Critical Care performed: No  .1-3 Lead EKG Interpretation  Performed by: Duffy Bruce, MD Authorized by: Duffy Bruce, MD     Interpretation: normal     ECG rate:  70-90    ECG rate assessment: normal     Rhythm: sinus rhythm     Ectopy: none     Conduction: normal   Comments:     Indication: Weakness, n/v .Critical Care  Performed by: Duffy Bruce, MD Authorized by: Duffy Bruce, MD   Critical care provider statement:    Critical care time (minutes):  30   Critical care was necessary to treat or prevent imminent or life-threatening deterioration of the following conditions:  Cardiac failure, circulatory failure and sepsis   Critical care was time spent personally by me on the following activities:  Development of treatment plan with patient or surrogate, discussions with consultants, evaluation of patient's response to treatment, examination of patient, ordering and review of laboratory studies, ordering and review of radiographic studies, ordering and performing treatments and interventions, pulse oximetry, re-evaluation of patient's condition and review of old charts     MEDICATIONS ORDERED IN ED: Medications  lactated ringers infusion ( Intravenous Infusion Verify 07/20/22 1801)  enoxaparin (LOVENOX) injection 50 mg (50 mg Subcutaneous Given 07/20/22 2023)  sodium chloride flush (NS) 0.9 % injection 3 mL (has no administration in time range)  cefTRIAXone (ROCEPHIN) 2 g in sodium chloride 0.9 % 100 mL IVPB (2 g Intravenous New Bag/Given 07/20/22 2030)  ondansetron (ZOFRAN) tablet 4 mg (has no administration in time range)    Or  ondansetron (ZOFRAN) injection 4 mg (has no administration in time range)  acetaminophen (TYLENOL) tablet 650 mg (650 mg Oral Given 07/20/22 1621)    Or  acetaminophen (TYLENOL) suppository 650 mg ( Rectal See Alternative 07/20/22 1621)  HYDROmorphone (DILAUDID) injection 0.5 mg (0.5 mg Intravenous Given 07/20/22 1822)  ipratropium-albuterol (DUONEB) 0.5-2.5 (3) MG/3ML nebulizer solution 3 mL (has no administration in time range)  busPIRone (BUSPAR) tablet 15 mg (15 mg Oral Given 07/20/22 2022)  loratadine (CLARITIN) tablet  10 mg (has no administration in time range)  escitalopram (LEXAPRO) tablet 20 mg (has no administration in time range)  folic acid (FOLVITE) tablet 1 mg (has no administration in time range)  gabapentin (NEURONTIN) capsule 300 mg (300 mg Oral Given 07/20/22 2022)  levothyroxine (SYNTHROID) tablet 75 mcg (has no administration in time range)  metoprolol succinate (TOPROL-XL) 24 hr tablet 25 mg (has no administration in time range)  oxybutynin (DITROPAN-XL) 24 hr tablet 5  mg (5 mg Oral Given 07/20/22 2023)  QUEtiapine (SEROQUEL) tablet 50 mg (has no administration in time range)  traZODone (DESYREL) tablet 125 mg (has no administration in time range)  pantoprazole (PROTONIX) injection 40 mg (40 mg Intravenous Given 07/20/22 1625)  lactated ringers bolus 1,000 mL (0 mLs Intravenous Stopped 07/20/22 1420)  ondansetron (ZOFRAN) injection 4 mg (4 mg Intravenous Given 07/20/22 1251)  iohexol (OMNIPAQUE) 300 MG/ML solution 80 mL (80 mLs Intravenous Contrast Given 07/20/22 1356)  ceFEPIme (MAXIPIME) 2 g in sodium chloride 0.9 % 100 mL IVPB (0 g Intravenous Stopped 07/20/22 1456)  metroNIDAZOLE (FLAGYL) IVPB 500 mg (0 mg Intravenous Stopped 07/20/22 1620)  sodium chloride 0.9 % bolus 1,000 mL (0 mLs Intravenous Stopped 07/20/22 1620)  fentaNYL (SUBLIMAZE) injection 50 mcg (50 mcg Intravenous Given 07/20/22 1503)     IMPRESSION / MDM / ASSESSMENT AND PLAN / ED COURSE  I reviewed the triage vital signs and the nursing notes.                              Differential diagnosis includes, but is not limited to, sepsis secondary to UTI, enteritis, diverticulitis, COVID-19, viral illness, foodborne illness, pneumonia  Patient's presentation is most consistent with acute presentation with potential threat to life or bodily function.  The patient is on the cardiac monitor to evaluate for evidence of arrhythmia and/or significant heart rate changes   72 year old female with history of COPD, GERD, lupus, previous  UTIs, dementia, here with nausea, vomiting, diarrhea, and fever.  Patient mildly tachycardic on arrival, with diffuse pain.  Lab work concerning for sepsis and patient started on broad-spectrum empiric antibiotics.  White count 21,000.  Lactic acid 4.4.  CMP with possible mild prerenal AKI.  Troponin negative.  EKG nonischemic.  Urinalysis consistent with UTI.  Chest x-ray clear.  CT abdomen/pelvis obtained, shows likely acute pyelonephritis.  Patient also has a possible cervical mass which will need OB follow-up.  I suspect her shortness of breath is due to pain from her pyelonephritis.  Will give analgesia, IV fluids, and admit.  Of note, BNP is significantly elevated.  She does not appear overtly hypervolemic.  Will be cautious with fluid resuscitation as patient does have normal blood pressure, but will give fluids for her significant lactic acid elevation at this time.   FINAL CLINICAL IMPRESSION(S) / ED DIAGNOSES   Final diagnoses:  Sepsis secondary to UTI [facility])     Rx / DC Orders   ED Discharge Orders     None        Note:  This document was prepared using Dragon voice recognition software and may include unintentional dictation errors.   Duffy Bruce, MD 07/20/22 2031

## 2022-07-20 NOTE — Assessment & Plan Note (Addendum)
Per triage, [facility] RN evaluated patient at home and she was saturating at 85% on room air. She is now on 1 L and saturations are well controlled. Considered aspiration pneumonia but no infiltrates on CXR. Potentially due to sepsis vs poor effort. BNP is elevated but no findings on exam to suggest HF. No wheezing to suggest COPD.   - Continue supplemental oxygen to maintain oxygen saturation above 92% - Wean as tolerated

## 2022-07-20 NOTE — Consult Note (Signed)
CODE SEPSIS - PHARMACY COMMUNICATION  **Broad Spectrum Antibiotics should be administered within 1 hour of Sepsis diagnosis**  Time Code Sepsis Called/Page Received: 1403  Antibiotics Ordered: vancomycin, cefepime, flagyl  Time of 1st antibiotic administration: flagyl@1421   Additional action taken by pharmacy: none  If necessary, Name of Provider/Nurse Contacted: n/a    Pearla Dubonnet ,PharmD Clinical Pharmacist  07/20/2022  2:51 PM

## 2022-07-20 NOTE — ED Triage Notes (Signed)
Patient to ED via [facility] from home for diarrhea and vomiting that started around 1am. Patient was evaluated by [facility]- O2 was initially 85% RA and placed on 2L by [facility] RN. 7 episodes of diarrhea and 3 of vomiting. Patient dry heaving during triage.

## 2022-07-20 NOTE — Assessment & Plan Note (Signed)
Secondary to underlying infection, vomiting/diarrhea and sepsis. No evidence of hydronephrosis on image  - IV fluids ordered - Repeat BMP in the a.m. - Avoid nephrotoxic agents

## 2022-07-20 NOTE — ED Notes (Signed)
Attempted to call granddaughter to give update, without answer.

## 2022-07-20 NOTE — H&P (Addendum)
History and Physical    Patient: Shelly Freeman [id number] DOB: Feb 18, 1951 DOA: 07/20/2022 DOS: the patient was seen and examined on 07/20/2022 PCP: Janna Arch, MD  Patient coming from: Home  Chief Complaint:  Chief Complaint  Patient presents with   Diarrhea   HPI: Shelly Freeman is a 72 y.o. female with medical history significant of recurrent UTI, SLE, collagen vascular disease, hypothyroidism, depression/anxiety, COPD, sleep apnea, who presents to the ED due to vomiting and diarrhea.  [name] states that she woke up around 1 AM today and had severe nausea, vomiting and diarrhea that has persisted innumerable times throughout the day.  She endorses fevers and bilateral lower abdominal pain.  She endorses dysuria but denies any urinary frequency.  She notes that her vomit initially appeared normal, but subsequently became bright red and she was experiencing chest pain.  Her chest pain has self resolved at this time.  She denies any shortness of breath or palpitations.  She does not note any of the symptoms before last night.  She endorses bilateral feet swelling that has been going on for few days.  ED Course:  On arrival to the ED, patient was hypertensive at 153/62 with heart rate of 85.  She was saturating at 100% on 2 L of supplemental oxygen.  Per nursing report, PA-C ER and placed patient on oxygen after O2 saturation was 85% at home.  She was afebrile at 98.4.  Initial workup notable for WBC of 21.4, hemoglobin of 11.6, sodium of 133, bicarb 20, glucose 154, BUN 24, creatinine 1.35, and GFR 42.  BNP elevated at 1200, however troponin negative x 2.  Urinalysis was obtained that demonstrates large leukocytes and positive nitrites and many bacteria.  CT of the abdomen was obtained that demonstrated pyelonephritis of the right.  Due to concern for urosepsis patient was started on cefepime, vancomycin and Flagyl.  TRH contacted for admission.  Review of Systems: As  mentioned in the history of present illness. All other systems reviewed and are negative.  Past Medical History:  Diagnosis Date   Anginal pain ([place])    Arthritis    Collagen vascular disease ([place])    COPD (chronic obstructive pulmonary disease) (HCC)    DDD (degenerative disc disease), lumbar 05/14/2016   Depression    Dizziness    Dyspnea    GERD (gastroesophageal reflux disease)    Heart valve problem    per grandaughter need surgery but they told her may not survive;   History of kidney stones    Hypertension    Hypothyroidism    Lupus ([place])    Neuromuscular disorder ([place])    Sleep apnea    no CPAP   Thyroid disease    Vitamin D deficiency 05/14/2016   Past Surgical History:  Procedure Laterality Date   APPENDECTOMY     BREAST BIOPSY Left    neg   CATARACT EXTRACTION W/PHACO Left 11/13/2021   Procedure: CATARACT EXTRACTION PHACO AND INTRAOCULAR LENS PLACEMENT (IOC) LEFT 3.63 00:35.9;  Surgeon: Eulogio Bear, MD;  Location: [facility];  Service: Ophthalmology;  Laterality: Left;   CATARACT EXTRACTION W/PHACO Right 12/04/2021   Procedure: CATARACT EXTRACTION PHACO AND INTRAOCULAR LENS PLACEMENT (IOC) RIGHT;  Surgeon: Eulogio Bear, MD;  Location: [facility];  Service: Ophthalmology;  Laterality: Right;  3.11 00:32.6   CESAREAN SECTION     x 2   COLONOSCOPY N/A 08/24/2021   Procedure: COLONOSCOPY;  Surgeon: [name], DO;  Location:  [facility] ENDOSCOPY;  Service: Gastroenterology;  Laterality: N/A;  SPANISH INTERPRETER   COLONOSCOPY WITH PROPOFOL N/A 07/01/2017   Procedure: COLONOSCOPY WITH PROPOFOL;  Surgeon: Manya Silvas, MD;  Location[facility] ENDOSCOPY;  Service: Endoscopy;  Laterality: N/A;   ESOPHAGOGASTRODUODENOSCOPY (EGD) WITH PROPOFOL N/A 07/01/2017   Procedure: ESOPHAGOGASTRODUODENOSCOPY (EGD) WITH PROPOFOL;  Surgeon: Manya Silvas, MD;  Location: [facility] ENDOSCOPY;  Service: Endoscopy;  Laterality: N/A;   GASTRIC BYPASS      LIPOMA EXCISION Left 06/14/2022   Procedure: EXCISION LIPOMA, left chest wall;  Surgeon: Jules Husbands, MD;  Location: [facility] ORS;  Service: General;  Laterality: Left;   LITHOTRIPSY     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   Procedure: TOTAL KNEE ARTHROPLASTY;  Surgeon: Hessie Knows, MD;  Location: [facility] ORS;  Service: Orthopedics;  Laterality: Left;   TUBAL LIGATION     Social History:  reports that she has never smoked. She has never used smokeless tobacco. She reports that she does not drink alcohol and does not use drugs.  Allergies  Allergen Reactions   Cholestyramine Other (See Comments)    severe    Family History  Problem Relation Age of Onset   Breast cancer Maternal Aunt    Breast cancer Cousin     Prior to Admission medications   Medication Sig Start Date End Date Taking? Authorizing Provider  acetaminophen (TYLENOL) 650 MG CR tablet Take 650 mg by mouth 3 (three) times daily as needed for pain. 07/06/22  Yes [provider]  alendronate (FOSAMAX) 70 MG tablet Take 70 mg by mouth once a week. Take with a full glass of water on an empty stomach.   Yes [provider]  busPIRone (BUSPAR) 15 MG tablet Take 15 mg by mouth 2 (two) times daily. 12/05/21  Yes [provider]  calcium carbonate (TUMS EX) 750 MG chewable tablet Chew 1 tablet by mouth 4 (four) times daily as needed for heartburn.   Yes [provider]  celecoxib (CELEBREX) 100 MG capsule Take 1 capsule by mouth 2 (two) times daily. 12/14/21  Yes [provider]  cetirizine (ZYRTEC) 10 MG tablet Take 10 mg by mouth daily.   Yes [provider]  cholecalciferol (VITAMIN D3) 25 MCG (1000 UNIT) tablet Take 1,000 Units by mouth daily. 12/05/21  Yes [provider]  cyanocobalamin (VITAMIN B12) 1000 MCG tablet Take 1,000 mcg by mouth daily. 07/06/22  Yes [provider]  DESITIN 40 % PSTE  07/07/21  Yes [provider]  diclofenac Sodium (VOLTAREN) 1 %  GEL Apply 2 g topically 4 (four) times daily.   Yes [provider]  escitalopram (LEXAPRO) 20 MG tablet Take 20 mg by mouth daily.   Yes [provider]  estradiol (ESTRACE) 0.1 MG/GM vaginal cream Place 0.5 g vaginally daily. Insert 1/2 gram into vagina every night as directed for two weeks then use twice weekly on Monday and Friday nights only 07/19/22  Yes [provider]  famotidine (PEPCID) 20 MG tablet Take 20 mg by mouth 2 (two) times daily.   Yes [provider]  ferrous sulfate 325 (65 FE) MG tablet Take 325 mg by mouth every Monday, Wednesday, and Friday.   Yes [provider]  fluticasone (FLONASE) 50 MCG/ACT nasal spray Place into both nostrils daily.   Yes [provider]  folic acid (FOLVITE) 1 MG tablet Take 1 mg by mouth daily.   Yes [provider]  gabapentin (NEURONTIN) 600 MG tablet Take  600 mg by mouth 3 (three) times daily. 05/07/21  Yes [provider]  hydrocortisone cream 1 % Apply 1 application topically 3 (three) times daily as needed for itching.   Yes [provider]  ibuprofen (ADVIL) 400 MG tablet Take 400 mg by mouth every 8 (eight) hours as needed. 05/09/21  Yes [provider]  ipratropium-albuterol (DUONEB) 0.5-2.5 (3) MG/3ML SOLN Take 3 mLs by nebulization every 6 (six) hours as needed (shortness of breath).   Yes [provider]  levothyroxine (SYNTHROID, LEVOTHROID) 75 MCG tablet Take 75 mcg by mouth daily before breakfast.   Yes [provider]  lidocaine (LIDODERM) 5 % Place 1 patch onto the skin daily. Remove & Discard patch within 12 hours or as directed by MD   Yes [provider]  methenamine (HIPREX) 1 g tablet Take 1 g by mouth daily. 07/19/22  Yes [provider]  metoprolol succinate (TOPROL-XL) 25 MG 24 hr tablet Take 25 mg by mouth daily.    Yes [provider]  oxybutynin (DITROPAN-XL) 5 MG 24 hr tablet Take 5 mg by mouth  in the morning and at bedtime. 12/05/21  Yes [provider]  pantoprazole (PROTONIX) 20 MG tablet Take 20 mg by mouth daily. 08/08/21 08/08/22 Yes [provider]  Psyllium (METAMUCIL) 0.36 g CAPS Take 0.4 g by mouth daily. Take 4 capsules by mouth daily 06/07/22  Yes [provider]  QUEtiapine (SEROQUEL) 50 MG tablet Take 50 mg by mouth at bedtime.   Yes [provider]  traMADol (ULTRAM) 50 MG tablet Take 1 tablet (50 mg total) by mouth every 6 (six) hours as needed. Patient taking differently: Take 50 mg by mouth 3 (three) times daily as needed for moderate pain. 06/14/22 06/14/23 Yes Pabon, Diego F, MD  traZODone (DESYREL) 100 MG tablet Take 125 mg by mouth at bedtime.   Yes [provider]  traZODone (DESYREL) 50 MG tablet Take 25 mg by mouth at bedtime as needed for sleep. 01/05/21  Yes [provider]  albuterol (VENTOLIN HFA) 108 (90 Base) MCG/ACT inhaler Inhale into the lungs. Inhale 2 puffs into the lungs every 4 (four) hours as needed for wheezing or shortness of breath.    [provider]  Difluprednate 0.05 % EMUL Apply to eye. Patient not taking: Reported on 07/20/2022 12/06/21   [provider]  diphenhydrAMINE (BENADRYL) 25 MG tablet Take 25 mg by mouth every 6 (six) hours as needed for itching.    [provider]  docusate sodium (COLACE) 100 MG capsule Take 1 capsule (100 mg total) by mouth 2 (two) times daily. Patient not taking: Reported on 07/20/2022 10/27/20   Duanne Guess, PA-C  leflunomide (ARAVA) 10 MG tablet Take 10 mg by mouth daily. Patient not taking: Reported on 07/20/2022 05/07/21   [provider]  liver oil-zinc oxide (DESITIN) 40 % ointment Apply 1 Application topically as needed for irritation.    [provider]  meloxicam (MOBIC) 15 MG tablet Take 15 mg by mouth daily. 05/17/21   [provider]  nystatin powder Apply 1 Application topically 2 (two) times daily.     [provider]  OLANZapine (ZYPREXA) 2.5 MG tablet Take 2.5 mg by mouth at bedtime. Patient not taking: Reported on 07/20/2022 05/07/21   [provider]  OLANZapine (ZYPREXA) 5 MG tablet Take 5 mg by mouth at bedtime. Patient not taking: Reported on 07/20/2022 05/07/21   [provider]  Olopatadine HCl (  PATADAY) 0.2 % SOLN Apply to eye daily as needed.    [provider]  Psyllium Fiber 0.52 g CAPS Take 0.52 Gy-cm2 by mouth 4 (four) times daily. 12/05/21   [provider]  VIGAMOX 0.5 % ophthalmic solution Apply to eye. Patient not taking: Reported on 07/20/2022 11/08/21   [provider]  Vitamin D, Ergocalciferol, (DRISDOL) 1.25 MG (50000 UNIT) CAPS capsule Take 50,000 Units by mouth every 7 (seven) days. Patient not taking: Reported on 07/20/2022    [provider]    Physical Exam: Vitals:   07/20/22 1153 07/20/22 1519  BP: (!) 153/62   Pulse: 88   Resp: 18   Temp: 98.4 F (36.9 C)   TempSrc: Oral   SpO2: 100%   Weight:  98.7 kg  Height:  5\' 1"  (1.549 m)   Physical Exam Vitals and nursing note reviewed.  Constitutional:      General: She is not in acute distress.    Appearance: She is obese. She is ill-appearing. She is not toxic-appearing.  HENT:     Head: Normocephalic and atraumatic.     Mouth/Throat:     Mouth: Mucous membranes are dry.     Pharynx: Oropharynx is clear.  Eyes:     Conjunctiva/sclera: Conjunctivae normal.     Pupils: Pupils are equal, round, and reactive to light.  Cardiovascular:     Rate and Rhythm: Normal rate and regular rhythm.     Heart sounds: No murmur heard. Pulmonary:     Effort: Pulmonary effort is normal. No respiratory distress.     Breath sounds: Normal breath sounds. No wheezing, rhonchi or rales.  Abdominal:     General: Bowel sounds are normal. There is no distension.     Palpations: Abdomen is soft.     Tenderness: There is abdominal tenderness (Bilateral lower panus TTP,).  There is no guarding.  Musculoskeletal:     Right lower leg: No edema.     Left lower leg: No edema.  Skin:    General: Skin is warm and dry.  Neurological:     Mental Status: She is alert.     Comments:  Patient is alert and oriented No focal weakness noted.  No facial asymmetry or dysarthria. Repetitive motions of the tongue and mouth  Psychiatric:        Mood and Affect: Mood normal.        Behavior: Behavior normal. Behavior is cooperative.    Data Reviewed: CBC with WBC of 21.4, hemoglobin of 11.6, MCV of 100.3, platelets of 169 CMP with sodium of 133, potassium 3.9, bicarb 20, glucose 154, BUN 24, creatinine 1.35, anion gap 14, albumin 2.9, AST 35, ALT 17, GFR 42 Lactic acid elevated 4.4 BNP elevated at 1248 Troponin negative x 2 COVID-19, influenza and RSV PCR negative Urinalysis with amber color, small hemoglobin, large leukocytes, positive nitrites, proteinuria, and many bacteria.  EKG personally.  Artifact making it difficult to discern whether sinus tachycardia or atrial flutter.  Will order repeat.  CT ABDOMEN PELVIS W CONTRAST  Result Date: 07/20/2022 CLINICAL DATA:  Abdominal pain, acute, nonlocalized EXAM: CT ABDOMEN AND PELVIS WITH CONTRAST TECHNIQUE: Multidetector CT imaging of the abdomen and pelvis was performed using the standard protocol following bolus administration of intravenous contrast. RADIATION DOSE REDUCTION: This exam was performed according to the departmental dose-optimization program which includes automated exposure control, adjustment of the mA and/or kV according to patient size and/or use of iterative reconstruction technique. CONTRAST:  10mL  OMNIPAQUE IOHEXOL 300 MG/ML  SOLN COMPARISON:  CT 05/14/2022. FINDINGS: Lower chest: Left basilar linear atelectasis/scarring. Hepatobiliary: No focal liver abnormality is seen. The gallbladder is unremarkable. Pancreas: Unremarkable. No pancreatic ductal dilatation or surrounding inflammatory changes. Spleen:  Normal in size without focal abnormality. Adrenals/Urinary Tract: Adrenal glands are unremarkable. There is asymmetric right bladder wall thickening with adjacent stranding. There is prominence of the right renal collecting system with urothelial enhancement. There is right-sided perinephric stranding. No obstructing stone identified. Stomach/Bowel: Small hiatal hernia. There is no evidence of bowel obstruction.No evidence of appendicitis. Vascular/Lymphatic: Scattered atherosclerosis. No AAA. No lymphadenopathy. Reproductive: Evidence of prior supracervical hysterectomy. There is a round enhancing lesion within the vaginal cuff/cervix, with fluid in the vaginal canal. This lesion measures 3.2 x 3.5 x 3.8 cm (series 2, image 88, series 7, image 65. This may have been present on prior exams but much less conspicuous. Other: Trace free fluid the pelvis. No bowel containing hernia. No free air. Musculoskeletal: No acute osseous abnormality. No suspicious osseous lesion. IMPRESSION: Asymmetric right bladder wall thickening with adjacent stranding, prominence of the right renal collecting system, urothelial enhancement, and right perinephric stranding. Findings are concerning for ascending urinary tract infection. Recommend correlation with urinalysis. Evidence of prior supracervical hysterectomy. Rounded enhancing lesion within the vaginal cuff/cervix, with fluid in the vaginal canal, measuring 3.2 x 3.5 x 3.8 cm. This could represent cervicitis but recommend follow-up with gyn to exclude underlying neoplasm. Trace free fluid in the pelvis. Electronically Signed   By: Maurine Simmering M.D.   On: 07/20/2022 14:40   DG Chest Portable 1 View  Result Date: 07/20/2022 CLINICAL DATA:  Shortness of breath. EXAM: PORTABLE CHEST 1 VIEW COMPARISON:  05/17/2021 FINDINGS: Rotated film. The cardiopericardial silhouette is within normal limits for size. Old right rib fractures again noted Telemetry leads overlie the chest. IMPRESSION:  No active disease. Electronically Signed   By: Misty Stanley M.D.   On: 07/20/2022 12:33    There are no new results to review at this time.  Assessment and Plan:  * Sepsis ([place]) On examination, patient is noted to be borderline tachycardic with rates between 95 and 105, tachypneic, with lab work notable for WBC of 21.4 and lactic acid of 4.4, CT abdomen demonstrating right-sided pyelonephritis consistent with urosepsis.  She was noted to be hypoxic when [facility] RN evaluated her and she has developed an AKI.  - S/p 2 L bolus (> 30 cc/lg IBW) - Continue maintenance fluids at 100 cc/h - Monitor closely for evidence of hypervolemia given elevated BNP - Urine and blood cultures pending - Management of pyelonephritis as noted below  Acute pyelonephritis Per chart review, patient has a history of recurrent UTI and pyelonephritis in the past with most cultures growing pansensitive E. coli, but occasionally has had Enterobacter with multiple resistances.  - Urine culture pending - S/p cefepime - Transition to ceftriaxone 2 g daily - Dilaudid for pain control  AKI (acute kidney injury) ([place]) Secondary to underlying infection, vomiting/diarrhea and sepsis. No evidence of hydronephrosis on image  - IV fluids ordered - Repeat BMP in the a.m. - Avoid nephrotoxic agents  Acute respiratory failure with hypoxia ([place]) Per triage, [facility] RN evaluated patient at home and she was saturating at 85% on room air. She is now on 1 L and saturations are well controlled. Considered aspiration pneumonia but no infiltrates on CXR. Potentially due to sepsis vs poor effort. BNP is elevated but no findings on exam to suggest HF.  No wheezing to suggest COPD.   - Continue supplemental oxygen to maintain oxygen saturation above 92% - Wean as tolerated  Vomiting and diarrhea Potentially due to underlying infection and sepsis.  No additional episodes on arrival to the ED.    - Zofran as needed for  nausea  Hematemesis Patient states her vomit was initially normal but then turned bright red after numerous episodes.  Most consistent with Mallory-Weiss tear.  No additional episodes since arrival to the ED. Hemoglobin is slightly decreased compared to prior but this could be due to acute illness.   - Hold off on GI consultation unless additional episodes should occur or hemoglobin continues to decrease - Recheck CBC this PM - Protonix twice daily  Severe recurrent major depression with psychotic features ([place]) Per chart review, patient was previously treated with olanzapine, but is not currently taking any longer.  She has some signs on exam concerning for tardive dyskinesia.  - Continue home BuSpar and trazodone - Outpatient follow-up with PCP  COPD (chronic obstructive pulmonary disease) (HCC) No wheezing on examination and patient declines cough.  -Continue home bronchodilators  Hypothyroidism - Continue home levothyroxine  Essential hypertension - Continue home metoprolol to avoid rebound tachycardia  Lupus (HCC) Not currently on any DMARDs or immunosuppressants  Advance Care Planning:   Code Status: Full Code verified by patient  Consults: None  Family Communication: No family at bedside  Severity of Illness: The appropriate patient status for this patient is INPATIENT. Inpatient status is judged to be reasonable and necessary in order to provide the required intensity of service to ensure the patient's safety. The patient's presenting symptoms, physical exam findings, and initial radiographic and laboratory data in the context of their chronic comorbidities is felt to place them at high risk for further clinical deterioration. Furthermore, it is not anticipated that the patient will be medically stable for discharge from the hospital within 2 midnights of admission.   * I certify that at the point of admission it is my clinical judgment that the patient will require  inpatient hospital care spanning beyond 2 midnights from the point of admission due to high intensity of service, high risk for further deterioration and high frequency of surveillance required.*  Author: Jose Persia, MD 07/20/2022 4:17 PM  For on call review [email or web address].

## 2022-07-20 NOTE — Assessment & Plan Note (Signed)
Per chart review, patient was previously treated with olanzapine, but is not currently taking any longer.  She has some signs on exam concerning for tardive dyskinesia.  - Continue home BuSpar and trazodone - Outpatient follow-up with PCP

## 2022-07-20 NOTE — ED Notes (Signed)
VO by MD to collect 2nd lactic after 2L of fluids

## 2022-07-20 NOTE — Assessment & Plan Note (Signed)
On examination, patient is noted to be borderline tachycardic with rates between 95 and 105, tachypneic, with lab work notable for WBC of 21.4 and lactic acid of 4.4, CT abdomen demonstrating right-sided pyelonephritis consistent with urosepsis.  She was noted to be hypoxic when [facility] RN evaluated her and she has developed an AKI.  - S/p 2 L bolus (> 30 cc/lg IBW) - Continue maintenance fluids at 100 cc/h - Monitor closely for evidence of hypervolemia given elevated BNP - Urine and blood cultures pending - Management of pyelonephritis as noted below

## 2022-07-20 NOTE — Assessment & Plan Note (Signed)
Continue home levothyroxine 

## 2022-07-20 NOTE — Assessment & Plan Note (Signed)
-   Continue home metoprolol to avoid rebound tachycardia

## 2022-07-21 DIAGNOSIS — A419 Sepsis, unspecified organism: Secondary | ICD-10-CM | POA: Diagnosis not present

## 2022-07-21 DIAGNOSIS — N170 Acute kidney failure with tubular necrosis: Secondary | ICD-10-CM

## 2022-07-21 DIAGNOSIS — R6520 Severe sepsis without septic shock: Secondary | ICD-10-CM | POA: Diagnosis not present

## 2022-07-21 LAB — BASIC METABOLIC PANEL
Anion gap: 10 (ref 5–15)
BUN: 20 mg/dL (ref 8–23)
CO2: 21 mmol/L — ABNORMAL LOW (ref 22–32)
Calcium: 7.8 mg/dL — ABNORMAL LOW (ref 8.9–10.3)
Chloride: 103 mmol/L (ref 98–111)
Creatinine, Ser: 1.25 mg/dL — ABNORMAL HIGH (ref 0.44–1.00)
GFR, Estimated: 46 mL/min — ABNORMAL LOW (ref >60)
Glucose, Bld: 101 mg/dL — ABNORMAL HIGH (ref 70–99)
Potassium: 3.8 mmol/L (ref 3.5–5.1)
Sodium: 134 mmol/L — ABNORMAL LOW (ref 135–145)

## 2022-07-21 LAB — CBC
HCT: 34.5 % — ABNORMAL LOW (ref 36.0–46.0)
Hemoglobin: 10.9 g/dL — ABNORMAL LOW (ref 12.0–15.0)
MCH: 32.4 pg (ref 26.0–34.0)
MCHC: 31.6 g/dL (ref 30.0–36.0)
MCV: 102.7 fL — ABNORMAL HIGH (ref 80.0–100.0)
Platelets: 115 10*3/uL — ABNORMAL LOW (ref 150–400)
RBC: 3.36 MIL/uL — ABNORMAL LOW (ref 3.87–5.11)
RDW: 13.4 % (ref 11.5–15.5)
WBC: 20.3 10*3/uL — ABNORMAL HIGH (ref 4.0–10.5)
nRBC: 0 % (ref 0.0–0.2)

## 2022-07-21 LAB — HIV ANTIBODY (ROUTINE TESTING W REFLEX): HIV Screen 4th Generation wRfx: NONREACTIVE

## 2022-07-21 MED ORDER — VANCOMYCIN HCL 2000 MG/400ML IV SOLN
2000.0000 mg | Freq: Once | INTRAVENOUS | Status: AC
  Administered 2022-07-21: 2000 mg via INTRAVENOUS
  Filled 2022-07-21: qty 400

## 2022-07-21 MED ORDER — LACTATED RINGERS IV BOLUS
1000.0000 mL | Freq: Once | INTRAVENOUS | Status: AC
  Administered 2022-07-21: 1000 mL via INTRAVENOUS

## 2022-07-21 MED ORDER — ACETAMINOPHEN 10 MG/ML IV SOLN
1000.0000 mg | Freq: Once | INTRAVENOUS | Status: AC
  Administered 2022-07-21: 1000 mg via INTRAVENOUS
  Filled 2022-07-21: qty 100

## 2022-07-21 MED ORDER — VANCOMYCIN HCL IN DEXTROSE 1-5 GM/200ML-% IV SOLN: 1000.0000 mg | Freq: Once | INTRAVENOUS | Status: DC

## 2022-07-21 MED ORDER — SODIUM CHLORIDE 0.9 % IV BOLUS
250.0000 mL | Freq: Once | INTRAVENOUS | Status: AC
  Administered 2022-07-21: 250 mL via INTRAVENOUS

## 2022-07-21 MED ORDER — LACTATED RINGERS IV SOLN: INTRAVENOUS | Status: AC

## 2022-07-21 NOTE — Progress Notes (Signed)
Progress Note   Patient: Shelly Freeman [id number] DOB: May 19, 1950 DOA: 07/20/2022     1 DOS: the patient was seen and examined on 07/21/2022   Brief hospital course:  Shelly Freeman is a 72 y.o. female with medical history significant of recurrent UTI, SLE, collagen vascular disease, hypothyroidism, depression/anxiety, COPD, sleep apnea, who presents to the ED due to vomiting and diarrhea.   Ms. Shelly Freeman states that she woke up around 1 AM today and had severe nausea, vomiting and diarrhea that has persisted innumerable times throughout the day.  She endorses fevers and bilateral lower abdominal pain.  She endorses dysuria but denies any urinary frequency.  She notes that her vomit initially appeared normal, but subsequently became bright red and she was experiencing chest pain.  Her chest pain has self resolved at this time.  She denies any shortness of breath or palpitations.  She does not note any of the symptoms before last night.  She endorses bilateral feet swelling that has been going on for few days.   ED Course:  On arrival to the ED, patient was hypertensive at 153/62 with heart rate of 85.  She was saturating at 100% on 2 L of supplemental oxygen.  Per nursing report, PA-C ER and placed patient on oxygen after O2 saturation was 85% at home.  She was afebrile at 98.4.  Initial workup notable for WBC of 21.4, hemoglobin of 11.6, sodium of 133, bicarb 20, glucose 154, BUN 24, creatinine 1.35, and GFR 42.  BNP elevated at 1200, however troponin negative x 2.  Urinalysis was obtained that demonstrates large leukocytes and positive nitrites and many bacteria.  CT of the abdomen was obtained that demonstrated pyelonephritis of the right.  Due to concern for urosepsis patient was started on cefepime, vancomycin and Flagyl.  TRH contacted for admission.     Assessment and Plan: Sepsis (Shelly Freeman) from acute pyelonephritis As evidenced by tachycardia, marked leukocytosis, lactic acid of 4.4  which shows a downward trend associated with AKI and acute hypoxic respiratory failure (room air pulse oximetry was 85% requiring oxygen supplementation at 2 L) Patient continues to have a fever with a Tmax of 100.6, relative hypotension and persistent leukocytosis CT abdomen demonstrated right-sided pyelonephritis.   Will give 1 L fluid bolus now Prior urine culture yielded E. coli and Enterobacter. Continue empiric antibiotic therapy with Rocephin Will give a one-time dose of vancomycin Follow-up results of blood and urine culture Send stool for C. difficile toxin and stool PCR     AKI (acute kidney injury) ([place]) Secondary to ATN from volume depletion related to GI losses from vomiting/diarrhea and sepsis.  No evidence of hydronephrosis on image Baseline serum creatinine is 1.05 and on admission it was 1.35 Continue hydration Repeat renal parameters in am     Acute respiratory failure with hypoxia ([place]) Patient was noted to have room air pulse oximetry of 85% and is currently on 1 L of oxygen per pulse with 92%. Will attempt to wean off oxygen as tolerated       Acute gastroenteritis Will send stool PCR as well as C. difficile toxin    Hematemesis Patient states her vomit was initially normal but then turned bright red after numerous episodes.  Most consistent with Mallory-Weiss tear.   No additional episodes since arrival to the ED.  Hemoglobin has remained stable.  Protonix twice daily    Severe recurrent major depression with psychotic features [facility]) Per chart review, patient was previously treated with olanzapine, but  is not currently taking any longer.  She has some signs on exam concerning for tardive dyskinesia. Continue home BuSpar and trazodone Outpatient follow-up with PCP    COPD (chronic obstructive pulmonary disease) (HCC) No wheezing on examination and patient declines cough. Continue home bronchodilators    Hypothyroidism - Continue home  levothyroxine   Essential hypertension - Hold metoprolol due to relative hypotension   Lupus (HCC) Not currently on any DMARDs or immunosuppressants    Morbid obesity (BMI 41) Complicates overall prognosis and care      Subjective: Patient is seen and examined at the bedside. Continues to have diarrhea as well as fever  Physical Exam: Vitals:   07/21/22 0616 07/21/22 0646 07/21/22 0830 07/21/22 1122  BP: (!) 85/68  (!) 94/57 (!) 92/34  Pulse: 87  74 77  Resp: 20  18 14   Temp: (!) 100.6 F (38.1 C) 100.1 F (37.8 C) (!) 97.5 F (36.4 C) 98.3 F (36.8 C)  TempSrc: Rectal Rectal Oral Oral  SpO2: 98%  98% 100%  Weight:   99.4 kg   Height:        Vitals and nursing note reviewed.  Constitutional:      General: She is not in acute distress.    Appearance: She is obese. She is ill-appearing. She is not toxic-appearing.  HENT:     Head: Normocephalic and atraumatic.     Mouth/Throat:     Mouth: Mucous membranes are dry.     Pharynx: Oropharynx is clear.  Eyes:     Conjunctiva/sclera: Conjunctivae normal.     Pupils: Pupils are equal, round, and reactive to light.  Cardiovascular:     Rate and Rhythm: Normal rate and regular rhythm.     Heart sounds: No murmur heard. Pulmonary:     Effort: Pulmonary effort is normal. No respiratory distress.     Breath sounds: Normal breath sounds. No wheezing, rhonchi or rales.  Abdominal:     General: Bowel sounds are normal. There is no distension.     Palpations: Abdomen is soft.     Tenderness: There is abdominal tenderness (Bilateral lower panus TTP,). There is no guarding.  Musculoskeletal:     Right lower leg: No edema.     Left lower leg: No edema.  Skin:    General: Skin is warm and dry.  Neurological:     Mental Status: She is alert.     Comments:  Patient is alert and oriented No focal weakness noted.  No facial asymmetry or dysarthria. Repetitive motions of the tongue and mouth  Psychiatric:        Mood and  Affect: Mood normal.        Behavior: Behavior normal. Behavior is cooperative.    Data Reviewed: Labs reviewed.  Noted to have an AKI with serum creatinine of 1.25 compared to baseline of 1.05.  White count 20.3. There are no new results to review at this time.  Family Communication:   Disposition: Status is: Inpatient Remains inpatient appropriate because: Patient remains on IV antibiotic therapy for sepsis  Planned Discharge Destination: Home    Time spent: 35 minutes  Author: Collier Bullock, MD 07/21/2022 12:01 PM  For on call review [email or web address].

## 2022-07-21 NOTE — Progress Notes (Signed)
0030 Patient temp was elevated to 104.2 rectally. Patient Yellow MEWS. Patient was packed with ice, Tylenol was given and NP was called for further orders. Cooling blanket ordered.  0057 Patient was given Dilaudid for pain. During the shift unable to sustained b/p. Patient was still c/o pain. Tylenol IV was given. A 250 cc NS  bolus. Order for Strict I&O's and daily weights. Will continue to monitor sign for fluid overload.

## 2022-07-21 NOTE — Plan of Care (Signed)

## 2022-07-21 NOTE — Consult Note (Signed)
Consult History and Physical   SERVICE: Gynecology   Patient Name: Shelly Freeman Patient MRN:   [id number]  CC: Asked to consult for vaginal lesion seen on CT scan.  HPI: Shelly Freeman is a 72 y.o. who was unable to coherently answer any questions at the time of exam.   Past Obstetrical History: OB History   No obstetric history on file.     Past Medical History: Past Medical History:  Diagnosis Date   Anginal pain ([place])    Arthritis    Collagen vascular disease ([place])    COPD (chronic obstructive pulmonary disease) (HCC)    DDD (degenerative disc disease), lumbar 05/14/2016   Depression    Dizziness    Dyspnea    GERD (gastroesophageal reflux disease)    Heart valve problem    per grandaughter need surgery but they told her may not survive;   History of kidney stones    Hypertension    Hypothyroidism    Lupus ([place])    Neuromuscular disorder ([place])    Sleep apnea    no CPAP   Thyroid disease    Vitamin D deficiency 05/14/2016    Past Surgical History:   Past Surgical History:  Procedure Laterality Date   APPENDECTOMY     BREAST BIOPSY Left    neg   CATARACT EXTRACTION W/PHACO Left 11/13/2021   Procedure: CATARACT EXTRACTION PHACO AND INTRAOCULAR LENS PLACEMENT (IOC) LEFT 3.63 00:35.9;  Surgeon: Eulogio Bear, MD;  Location: [place];  Service: Ophthalmology;  Laterality: Left;   CATARACT EXTRACTION W/PHACO Right 12/04/2021   Procedure: CATARACT EXTRACTION PHACO AND INTRAOCULAR LENS PLACEMENT (IOC) RIGHT;  Surgeon: Eulogio Bear, MD;  Location: [facility];  Service: Ophthalmology;  Laterality: Right;  3.11 00:32.6   CESAREAN SECTION     x 2   COLONOSCOPY N/A 08/24/2021   Procedure: COLONOSCOPY;  Surgeon: [name], DO;  Location: [facility] ENDOSCOPY;  Service: Gastroenterology;  Laterality: N/A;  SPANISH INTERPRETER   COLONOSCOPY WITH PROPOFOL N/A 07/01/2017   Procedure: COLONOSCOPY WITH PROPOFOL;  Surgeon: Manya Silvas, MD;  Location: [facility] ENDOSCOPY;  Service: Endoscopy;  Laterality: N/A;   ESOPHAGOGASTRODUODENOSCOPY (EGD) WITH PROPOFOL N/A 07/01/2017   Procedure: ESOPHAGOGASTRODUODENOSCOPY (EGD) WITH PROPOFOL;  Surgeon: Manya Silvas, MD;  Location: [facility] ENDOSCOPY;  Service: Endoscopy;  Laterality: N/A;   GASTRIC BYPASS     LIPOMA EXCISION Left 06/14/2022   Procedure: EXCISION LIPOMA, left chest wall;  Surgeon: Jules Husbands, MD;  Location: [facility] ORS;  Service: General;  Laterality: Left;   LITHOTRIPSY     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   Procedure: TOTAL KNEE ARTHROPLASTY;  Surgeon: Hessie Knows, MD;  Location: [facility] ORS;  Service: Orthopedics;  Laterality: Left;   TUBAL LIGATION      Family History:  family history includes Breast cancer in her cousin and maternal aunt.  Social History:  Social History   Socioeconomic History   Marital status: Divorced    Spouse name: Not on file   Number of children: Not on file   Years of education: Not on file   Highest education level: Not on file  Occupational History   Not on file  Tobacco Use   Smoking status: Never   Smokeless tobacco: Never  Vaping Use   Vaping Use: Never used  Substance and Sexual Activity   Alcohol use: No   Drug use: No   Sexual activity: Not Currently    Birth control/protection: Abstinence, Post-menopausal  Other Topics Concern   Not on file  Social History Narrative   Lives alone   Social Determinants of Health   Financial Resource Strain: Not on file  Food Insecurity: Patient Unable To Answer (07/20/2022)   Hunger Vital Sign    Worried About Running Out of Food in the Last Year: Patient unable to answer    [place] in the Last Year: Patient unable to answer  Transportation Needs: Patient Unable To Answer (07/20/2022)   PRAPARE - Transportation    Lack of Transportation (Medical): Patient unable to answer    Lack of Transportation (Non-Medical): Patient unable to answer  Physical  Activity: Not on file  Stress: Not on file  Social Connections: Not on file  Intimate Partner Violence: Patient Unable To Answer (07/20/2022)   Humiliation, Afraid, Rape, and Kick questionnaire    Fear of Current or Ex-Partner: Patient unable to answer    Emotionally Abused: Patient unable to answer    Physically Abused: Patient unable to answer    Sexually Abused: Patient unable to answer    Home Medications:  Medications reconciled in EPIC  No current facility-administered medications on file prior to encounter.   Current Outpatient Medications on File Prior to Encounter  Medication Sig Dispense Refill   acetaminophen (TYLENOL) 650 MG CR tablet Take 650 mg by mouth 3 (three) times daily as needed for pain.     alendronate (FOSAMAX) 70 MG tablet Take 70 mg by mouth once a week. Take with a full glass of water on an empty stomach.     busPIRone (BUSPAR) 15 MG tablet Take 15 mg by mouth 2 (two) times daily.     calcium carbonate (TUMS EX) 750 MG chewable tablet Chew 1 tablet by mouth 4 (four) times daily as needed for heartburn.     celecoxib (CELEBREX) 100 MG capsule Take 1 capsule by mouth 2 (two) times daily.     cetirizine (ZYRTEC) 10 MG tablet Take 10 mg by mouth daily.     cholecalciferol (VITAMIN D3) 25 MCG (1000 UNIT) tablet Take 1,000 Units by mouth daily.     cyanocobalamin (VITAMIN B12) 1000 MCG tablet Take 1,000 mcg by mouth daily.     DESITIN 40 % PSTE      diclofenac Sodium (VOLTAREN) 1 % GEL Apply 2 g topically 4 (four) times daily.     escitalopram (LEXAPRO) 20 MG tablet Take 20 mg by mouth daily.     estradiol (ESTRACE) 0.1 MG/GM vaginal cream Place 0.5 g vaginally daily. Insert 1/2 gram into vagina every night as directed for two weeks then use twice weekly on Monday and Friday nights only     famotidine (PEPCID) 20 MG tablet Take 20 mg by mouth 2 (two) times daily.     ferrous sulfate 325 (65 FE) MG tablet Take 325 mg by mouth every Monday, Wednesday, and Friday.      fluticasone (FLONASE) 50 MCG/ACT nasal spray Place into both nostrils daily.     folic acid (FOLVITE) 1 MG tablet Take 1 mg by mouth daily.     gabapentin (NEURONTIN) 600 MG tablet Take 600 mg by mouth 3 (three) times daily.     hydrocortisone cream 1 % Apply 1 application topically 3 (three) times daily as needed for itching.     ibuprofen (ADVIL) 400 MG tablet Take 400 mg by mouth every 8 (eight) hours as needed.     ipratropium-albuterol (DUONEB) 0.5-2.5 (3) MG/3ML SOLN Take 3 mLs by  nebulization every 6 (six) hours as needed (shortness of breath).     levothyroxine (SYNTHROID, LEVOTHROID) 75 MCG tablet Take 75 mcg by mouth daily before breakfast.     lidocaine (LIDODERM) 5 % Place 1 patch onto the skin daily. Remove & Discard patch within 12 hours or as directed by MD     methenamine (HIPREX) 1 g tablet Take 1 g by mouth daily.     metoprolol succinate (TOPROL-XL) 25 MG 24 hr tablet Take 25 mg by mouth daily.      oxybutynin (DITROPAN-XL) 5 MG 24 hr tablet Take 5 mg by mouth in the morning and at bedtime.     pantoprazole (PROTONIX) 20 MG tablet Take 20 mg by mouth daily.     Psyllium (METAMUCIL) 0.36 g CAPS Take 0.4 g by mouth daily. Take 4 capsules by mouth daily     QUEtiapine (SEROQUEL) 50 MG tablet Take 50 mg by mouth at bedtime.     traMADol (ULTRAM) 50 MG tablet Take 1 tablet (50 mg total) by mouth every 6 (six) hours as needed. (Patient taking differently: Take 50 mg by mouth 3 (three) times daily as needed for moderate pain.) 15 tablet 0   traZODone (DESYREL) 100 MG tablet Take 125 mg by mouth at bedtime.     traZODone (DESYREL) 50 MG tablet Take 25 mg by mouth at bedtime as needed for sleep.     albuterol (VENTOLIN HFA) 108 (90 Base) MCG/ACT inhaler Inhale into the lungs. Inhale 2 puffs into the lungs every 4 (four) hours as needed for wheezing or shortness of breath.     Difluprednate 0.05 % EMUL Apply to eye. (Patient not taking: Reported on 07/20/2022)     diphenhydrAMINE  (BENADRYL) 25 MG tablet Take 25 mg by mouth every 6 (six) hours as needed for itching.     docusate sodium (COLACE) 100 MG capsule Take 1 capsule (100 mg total) by mouth 2 (two) times daily. (Patient not taking: Reported on 07/20/2022) 10 capsule 0   leflunomide (ARAVA) 10 MG tablet Take 10 mg by mouth daily. (Patient not taking: Reported on 07/20/2022)     liver oil-zinc oxide (DESITIN) 40 % ointment Apply 1 Application topically as needed for irritation.     meloxicam (MOBIC) 15 MG tablet Take 15 mg by mouth daily.     nystatin powder Apply 1 Application topically 2 (two) times daily.     OLANZapine (ZYPREXA) 2.5 MG tablet Take 2.5 mg by mouth at bedtime. (Patient not taking: Reported on 07/20/2022)     OLANZapine (ZYPREXA) 5 MG tablet Take 5 mg by mouth at bedtime. (Patient not taking: Reported on 07/20/2022)     Olopatadine HCl (PATADAY) 0.2 % SOLN Apply to eye daily as needed.     Psyllium Fiber 0.52 g CAPS Take 0.52 Gy-cm2 by mouth 4 (four) times daily.     VIGAMOX 0.5 % ophthalmic solution Apply to eye. (Patient not taking: Reported on 07/20/2022)     Vitamin D, Ergocalciferol, (DRISDOL) 1.25 MG (50000 UNIT) CAPS capsule Take 50,000 Units by mouth every 7 (seven) days. (Patient not taking: Reported on 07/20/2022)      Allergies:  Allergies  Allergen Reactions   Cholestyramine Other (See Comments)    severe    Physical Exam:  Temp:  [97.5 F (36.4 C)-104.2 F (40.1 C)] 98.3 F (36.8 C) (03/16 1122) Pulse Rate:  [74-105] 77 (03/16 1122) Resp:  [14-22] 14 (03/16 1122) BP: (85-133)/(34-118) 92/34 (03/16 1122) SpO2:  [90 %-  100 %] 100 % (03/16 1122) Weight:  [99.4 kg] 99.4 kg (03/16 0830)   General Appearance:  Acute distress, alert and oriented x3 Pelvic:  Pt was guarding and vocalized great discomfort during the exam. At the end of the vagina a small area of discoloration that had the appearance of a small lump about 1cm in diameter.  no lesions, normal vaginal mucosa, no vaginal  bleeding or discharge, no pelvic organ prolapse,    Labs/Studies:   CBC and Coags:  Lab Results  Component Value Date   WBC 20.3 (H) 07/21/2022   NEUTOPHILPCT 81 05/17/2021   EOSPCT 1 05/17/2021   BASOPCT 0 05/17/2021   LYMPHOPCT 11 05/17/2021   HGB 10.9 (L) 07/21/2022   HCT 34.5 (L) 07/21/2022   MCV 102.7 (H) 07/21/2022   PLT 115 (L) 07/21/2022   INR 0.9 05/27/2019   CMP:  Lab Results  Component Value Date   NA 134 (L) 07/21/2022   K 3.8 07/21/2022   CL 103 07/21/2022   CO2 21 (L) 07/21/2022   BUN 20 07/21/2022   CREATININE 1.25 (H) 07/21/2022   CREATININE 1.35 (H) 07/20/2022   CREATININE 1.05 (H) 05/17/2021   PROT 6.5 07/20/2022   BILITOT 1.0 07/20/2022   BILIDIR <0.1 (L) 03/31/2016   ALT 17 07/20/2022   AST 35 07/20/2022   ALKPHOS 76 07/20/2022    Other Imaging: CT ABDOMEN PELVIS W CONTRAST  Result Date: 07/20/2022 CLINICAL DATA:  Abdominal pain, acute, nonlocalized EXAM: CT ABDOMEN AND PELVIS WITH CONTRAST TECHNIQUE: Multidetector CT imaging of the abdomen and pelvis was performed using the standard protocol following bolus administration of intravenous contrast. RADIATION DOSE REDUCTION: This exam was performed according to the departmental dose-optimization program which includes automated exposure control, adjustment of the mA and/or kV according to patient size and/or use of iterative reconstruction technique. CONTRAST:  34mL OMNIPAQUE IOHEXOL 300 MG/ML  SOLN COMPARISON:  CT 05/14/2022. FINDINGS: Lower chest: Left basilar linear atelectasis/scarring. Hepatobiliary: No focal liver abnormality is seen. The gallbladder is unremarkable. Pancreas: Unremarkable. No pancreatic ductal dilatation or surrounding inflammatory changes. Spleen: Normal in size without focal abnormality. Adrenals/Urinary Tract: Adrenal glands are unremarkable. There is asymmetric right bladder wall thickening with adjacent stranding. There is prominence of the right renal collecting system with  urothelial enhancement. There is right-sided perinephric stranding. No obstructing stone identified. Stomach/Bowel: Small hiatal hernia. There is no evidence of bowel obstruction.No evidence of appendicitis. Vascular/Lymphatic: Scattered atherosclerosis. No AAA. No lymphadenopathy. Reproductive: Evidence of prior supracervical hysterectomy. There is a round enhancing lesion within the vaginal cuff/cervix, with fluid in the vaginal canal. This lesion measures 3.2 x 3.5 x 3.8 cm (series 2, image 88, series 7, image 65. This may have been present on prior exams but much less conspicuous. Other: Trace free fluid the pelvis. No bowel containing hernia. No free air. Musculoskeletal: No acute osseous abnormality. No suspicious osseous lesion. IMPRESSION: Asymmetric right bladder wall thickening with adjacent stranding, prominence of the right renal collecting system, urothelial enhancement, and right perinephric stranding. Findings are concerning for ascending urinary tract infection. Recommend correlation with urinalysis. Evidence of prior supracervical hysterectomy. Rounded enhancing lesion within the vaginal cuff/cervix, with fluid in the vaginal canal, measuring 3.2 x 3.5 x 3.8 cm. This could represent cervicitis but recommend follow-up with gyn to exclude underlying neoplasm. Trace free fluid in the pelvis. Electronically Signed   By: Maurine Simmering M.D.   On: 07/20/2022 14:40   DG Chest Portable 1 View  Result Date: 07/20/2022 CLINICAL  DATA:  Shortness of breath. EXAM: PORTABLE CHEST 1 VIEW COMPARISON:  05/17/2021 FINDINGS: Rotated film. The cardiopericardial silhouette is within normal limits for size. Old right rib fractures again noted Telemetry leads overlie the chest. IMPRESSION: No active disease. Electronically Signed   By: Misty Stanley M.D.   On: 07/20/2022 12:33     Assessment / Plan:   Shelly Freeman is a 72 y.o. who presents with ***  1. ***   Thank you for the opportunity to be involved with  this pt's care.

## 2022-07-22 DIAGNOSIS — A419 Sepsis, unspecified organism: Secondary | ICD-10-CM | POA: Diagnosis not present

## 2022-07-22 DIAGNOSIS — R6520 Severe sepsis without septic shock: Secondary | ICD-10-CM | POA: Diagnosis not present

## 2022-07-22 DIAGNOSIS — N170 Acute kidney failure with tubular necrosis: Secondary | ICD-10-CM | POA: Diagnosis not present

## 2022-07-22 LAB — CBC
HCT: 31 % — ABNORMAL LOW (ref 36.0–46.0)
Hemoglobin: 10 g/dL — ABNORMAL LOW (ref 12.0–15.0)
MCH: 32.6 pg (ref 26.0–34.0)
MCHC: 32.3 g/dL (ref 30.0–36.0)
MCV: 101 fL — ABNORMAL HIGH (ref 80.0–100.0)
Platelets: 119 10*3/uL — ABNORMAL LOW (ref 150–400)
RBC: 3.07 MIL/uL — ABNORMAL LOW (ref 3.87–5.11)
RDW: 13.7 % (ref 11.5–15.5)
WBC: 13.2 10*3/uL — ABNORMAL HIGH (ref 4.0–10.5)
nRBC: 0 % (ref 0.0–0.2)

## 2022-07-22 LAB — BASIC METABOLIC PANEL
Anion gap: 6 (ref 5–15)
BUN: 16 mg/dL (ref 8–23)
CO2: 20 mmol/L — ABNORMAL LOW (ref 22–32)
Calcium: 7.5 mg/dL — ABNORMAL LOW (ref 8.9–10.3)
Chloride: 109 mmol/L (ref 98–111)
Creatinine, Ser: 0.98 mg/dL (ref 0.44–1.00)
GFR, Estimated: >60 mL/min (ref >60)
Glucose, Bld: 101 mg/dL — ABNORMAL HIGH (ref 70–99)
Potassium: 3.6 mmol/L (ref 3.5–5.1)
Sodium: 135 mmol/L (ref 135–145)

## 2022-07-22 LAB — URINE CULTURE: Culture: 80000 — AB

## 2022-07-22 MED ORDER — SODIUM CHLORIDE 0.9 % IV SOLN
1.0000 g | Freq: Three times a day (TID) | INTRAVENOUS | Status: AC
  Administered 2022-07-22 – 2022-07-25 (×12): 1 g via INTRAVENOUS
  Filled 2022-07-22: qty 1
  Filled 2022-07-22 (×3): qty 20
  Filled 2022-07-22: qty 1
  Filled 2022-07-22 (×2): qty 20
  Filled 2022-07-22: qty 1
  Filled 2022-07-22 (×4): qty 20

## 2022-07-22 MED ORDER — ACETAMINOPHEN 500 MG PO TABS
1000.0000 mg | ORAL_TABLET | Freq: Four times a day (QID) | ORAL | Status: DC | PRN
  Administered 2022-07-22 – 2022-07-25 (×9): 1000 mg via ORAL
  Filled 2022-07-22 (×9): qty 2

## 2022-07-22 MED ORDER — PANTOPRAZOLE SODIUM 40 MG PO TBEC
40.0000 mg | DELAYED_RELEASE_TABLET | Freq: Every day | ORAL | Status: DC
  Administered 2022-07-23 – 2022-07-26 (×4): 40 mg via ORAL
  Filled 2022-07-22 (×4): qty 1

## 2022-07-22 MED ORDER — SODIUM CHLORIDE 0.9 % IV SOLN: 1.0000 g | INTRAVENOUS | Status: DC

## 2022-07-22 MED ORDER — PHENOL 1.4 % MT LIQD
1.0000 | OROMUCOSAL | Status: DC | PRN
  Administered 2022-07-22: 1 via OROMUCOSAL
  Filled 2022-07-22: qty 177

## 2022-07-22 NOTE — Progress Notes (Signed)
Progress Note   Patient: Shelly Freeman [id number] DOB: 06-12-1950 DOA: 07/20/2022     2 DOS: the patient was seen and examined on 07/22/2022   Brief hospital course:  Shelly Freeman is a 72 y.o. female with medical history significant of recurrent UTI, SLE, collagen vascular disease, hypothyroidism, depression/anxiety, COPD, sleep apnea, who presents to the ED due to vomiting and diarrhea.   [name] states that she woke up around 1 AM today and had severe nausea, vomiting and diarrhea that has persisted innumerable times throughout the day.  She endorses fevers and bilateral lower abdominal pain.  She endorses dysuria but denies any urinary frequency.  She notes that her vomit initially appeared normal, but subsequently became bright red and she was experiencing chest pain.  Her chest pain has self resolved at this time.  She denies any shortness of breath or palpitations.  She does not note any of the symptoms before last night.  She endorses bilateral feet swelling that has been going on for few days.   ED Course:  On arrival to the ED, patient was hypertensive at 153/62 with heart rate of 85.  She was saturating at 100% on 2 L of supplemental oxygen.  Per nursing report, PA-C ER and placed patient on oxygen after O2 saturation was 85% at home.  She was afebrile at 98.4.  Initial workup notable for WBC of 21.4, hemoglobin of 11.6, sodium of 133, bicarb 20, glucose 154, BUN 24, creatinine 1.35, and GFR 42.  BNP elevated at 1200, however troponin negative x 2.  Urinalysis was obtained that demonstrates large leukocytes and positive nitrites and many bacteria.  CT of the abdomen was obtained that demonstrated pyelonephritis of the right.  Due to concern for urosepsis patient was started on cefepime, vancomycin and Flagyl.  TRH contacted for admission.    Assessment and Plan: Sepsis ([place]) from acute pyelonephritis As evidenced by tachycardia, marked leukocytosis, lactic acid of 4.4  which showed a downward trend associated with AKI and acute hypoxic respiratory failure (room air pulse oximetry was 85% requiring oxygen supplementation at 2 L) Patient continues to have a fever with a Tmax of 100.9 down from 104.2 in the early hours of the morning.  Blood pressure has improved and leukocytosis shows a downward trend 20.3 >> 13.2 CT abdomen demonstrated right-sided pyelonephritis.   Urine culture yielded 80,000 CFU of ESBL E. Coli Will discontinue Rocephin and start patient on IV meropenem       AKI (acute kidney injury) ([place]) Secondary to ATN from volume depletion related to GI losses from vomiting/diarrhea and sepsis.  No evidence of hydronephrosis on image Baseline serum creatinine is 1.05 and on admission it was 1.35 Renal function has improved with hydration      Acute respiratory failure with hypoxia (HCC) Patient was noted to have room air pulse oximetry of 85% and is currently on 1 L of oxygen per pulse with 92%. Will attempt to wean off oxygen as tolerated         Acute gastroenteritis Awaiting results of stool studies     Hematemesis Patient states her vomit was initially normal but then turned bright red after numerous episodes.  Most consistent with Mallory-Weiss tear.   No additional episodes since arrival to the ED.  Noted to have a drop in her H&H which may be hemo dilutional Switch PPI to oral     Severe recurrent major depression with psychotic features [facility]) Per chart review, patient was previously treated with  olanzapine, but is not currently taking any longer.  She has some signs on exam concerning for tardive dyskinesia. Continue home BuSpar and trazodone Outpatient follow-up with PCP     COPD (chronic obstructive pulmonary disease) (HCC) No wheezing on examination and patient declines cough. Continue home bronchodilators     Hypothyroidism - Continue home levothyroxine   Essential hypertension - Hold metoprolol due to relative  hypotension   Lupus (HCC) Not currently on any DMARDs or immunosuppressants       Morbid obesity (BMI 41) Complicates overall prognosis and care          Subjective: Patient is seen and examined at the bedside.  Complains of a headache and a sore throat.  Physical Exam: Vitals:   07/22/22 0724 07/22/22 0831 07/22/22 0835 07/22/22 0845  BP: (!) 107/53 (!) 93/50  (!) 130/52  Pulse:  67    Resp:      Temp:  98.3 F (36.8 C)    TempSrc:  Oral    SpO2: 98% 97% 95% 94%  Weight:      Height:       Vitals and nursing note reviewed.  Constitutional:      General: She is not in acute distress.    Appearance: She is obese. She is ill-appearing. She is not toxic-appearing.  HENT:     Head: Normocephalic and atraumatic.     Mouth/Throat:     Mouth: Mucous membranes are moist.     Pharynx: Oropharynx is clear.  Eyes:     Conjunctiva/sclera: Conjunctivae normal.     Pupils: Pupils are equal, round, and reactive to light.  Cardiovascular:     Rate and Rhythm: Normal rate and regular rhythm.     Heart sounds: No murmur heard. Pulmonary:     Effort: Pulmonary effort is normal. No respiratory distress.     Breath sounds: Normal breath sounds. No wheezing, rhonchi or rales.  Abdominal:     General: Bowel sounds are normal. There is no distension.     Palpations: Abdomen is soft.     Tenderness: There is abdominal tenderness (Bilateral lower panus TTP,). There is no guarding.  Musculoskeletal:     Right lower leg: No edema.     Left lower leg: No edema.  Skin:    General: Skin is warm and dry.  Neurological:     Mental Status: She is alert.     Comments:  Patient is alert and oriented No focal weakness noted.  No facial asymmetry or dysarthria. Repetitive motions of the tongue and mouth  Psychiatric:        Mood and Affect: Mood normal.        Behavior: Behavior normal. Behavior is cooperative.     Data Reviewed: Labs reviewed.  Leukocytosis shows a downward trend.   Renal function has normalized There are no new results to review at this time.  Family Communication: Called and updated patient's granddaughter [name] over the phone.  All questions and concerns have been addressed.  She verbalizes understanding and agrees with the plan  Disposition: Status is: Inpatient Remains inpatient appropriate because: Remains on IV antibiotics for ESBL E. coli infection  Planned Discharge Destination: TBD    Time spent: 35 minutes  Author: Collier Bullock, MD 07/22/2022 11:01 AM  For on call review [email or web address].

## 2022-07-23 ENCOUNTER — Inpatient Hospital Stay: Payer: Medicare (Managed Care)

## 2022-07-23 DIAGNOSIS — R6520 Severe sepsis without septic shock: Secondary | ICD-10-CM | POA: Diagnosis not present

## 2022-07-23 DIAGNOSIS — N170 Acute kidney failure with tubular necrosis: Secondary | ICD-10-CM | POA: Diagnosis not present

## 2022-07-23 DIAGNOSIS — A419 Sepsis, unspecified organism: Secondary | ICD-10-CM | POA: Diagnosis not present

## 2022-07-23 LAB — GASTROINTESTINAL PANEL BY PCR, STOOL (REPLACES STOOL CULTURE)

## 2022-07-23 MED ORDER — TRAMADOL HCL 50 MG PO TABS
50.0000 mg | ORAL_TABLET | Freq: Four times a day (QID) | ORAL | Status: DC | PRN
  Administered 2022-07-23 – 2022-07-25 (×4): 50 mg via ORAL
  Filled 2022-07-23 (×4): qty 1

## 2022-07-23 NOTE — Progress Notes (Addendum)
Progress Note   Patient: Shelly Freeman [id number] DOB: [birth year] DOA: 07/20/2022     3 DOS: the patient was seen and examined on 07/23/2022   Brief hospital course:  Shelly Freeman is a 72 y.o. female with medical history significant of recurrent UTI, SLE, collagen vascular disease, hypothyroidism, depression/anxiety, COPD, sleep apnea, who presents to the ED due to vomiting and diarrhea.   Shelly Freeman states that she woke up around 1 AM today and had severe nausea, vomiting and diarrhea that has persisted innumerable times throughout the day.  She endorses fevers and bilateral lower abdominal pain.  She endorses dysuria but denies any urinary frequency.  She notes that her vomit initially appeared normal, but subsequently became bright red and she was experiencing chest pain.  Her chest pain has self resolved at this time.  She denies any shortness of breath or palpitations.  She does not note any of the symptoms before last night.  She endorses bilateral feet swelling that has been going on for few days.   ED Course:  On arrival to the ED, patient was hypertensive at 153/62 with heart rate of 85.  She was saturating at 100% on 2 L of supplemental oxygen.  Per nursing report, PA-C ER and placed patient on oxygen after O2 saturation was 85% at home.  She was afebrile at 98.4.  Initial workup notable for WBC of 21.4, hemoglobin of 11.6, sodium of 133, bicarb 20, glucose 154, BUN 24, creatinine 1.35, and GFR 42.  BNP elevated at 1200, however troponin negative x 2.  Urinalysis was obtained that demonstrates large leukocytes and positive nitrites and many bacteria.  CT of the abdomen was obtained that demonstrated pyelonephritis of the right.  Due to concern for urosepsis patient was started on cefepime, vancomycin and Flagyl.  TRH contacted for admission.    Assessment and Plan:  Sepsis ([place]) from acute pyelonephritis As evidenced by tachycardia, marked leukocytosis, lactic acid of 4.4  which showed a downward trend associated with AKI and acute hypoxic respiratory failure (room air pulse oximetry was 85% requiring oxygen supplementation at 2 L) Patient continues to have a fever with a Tmax of 100.7 down from 104.2 in the early hours of the morning.  Blood pressure has improved and leukocytosis shows a downward trend 20.3 >> 13.2 She has been weaned off oxygen CT abdomen demonstrated right-sided pyelonephritis.   Urine culture yielded 80,000 CFU of ESBL E. Coli Continue IV meropenem  # 2       AKI (acute kidney injury) ([place]) Secondary to ATN from volume depletion related to GI losses from vomiting/diarrhea and sepsis.  No evidence of hydronephrosis on image Baseline serum creatinine is 1.05 and on admission it was 1.35 Renal function has improved with hydration       Acute respiratory failure with hypoxia (HCC) Patient was noted to have room air pulse oximetry of 85% and is currently on 1 L of oxygen per pulse with 92%. Patient has been weaned off oxygen         Acute gastroenteritis Awaiting results of stool studies     Hematemesis Patient states her vomit was initially normal but then turned bright red after numerous episodes.  Most consistent with Mallory-Weiss tear.   No additional episodes since arrival to the ED.  Noted to have a drop in her H&H which may be hemo dilutional Switch PPI to oral     Severe recurrent major depression with psychotic features [facility]) Per chart review, patient was  previously treated with olanzapine, but is not currently taking any longer. Continue home BuSpar and trazodone Outpatient follow-up with PCP     COPD (chronic obstructive pulmonary disease) (HCC) No wheezing on examination and patient declines cough. Continue home bronchodilators     Hypothyroidism - Continue home levothyroxine   Essential hypertension - Hold metoprolol since patient normotensive   Lupus ([place]) Not currently on any DMARDs or  immunosuppressants       Morbid obesity (BMI 41) Complicates overall prognosis and care     Abnormal CT scan finding Patient noted to have a rounded enhancing lesion within the vaginal cuff/cervix, with fluid in the vaginal canal, measuring 3.2 x 3.5 x 3.8 cm. This could represent cervicitis but recommend follow-up with gyn to exclude underlying neoplasm. Appreciate GYN input Follow-up results of pelvic ultrasound       Subjective: Patient is seen and examined at the bedside.  Continues to complain of having loose stools.  Physical Exam: Vitals:   07/22/22 2331 07/23/22 0504 07/23/22 0754 07/23/22 1215  BP: (!) 102/45 (!) 145/73 (!) 112/44 (!) 141/60  Pulse: 72 85 67 70  Resp: 20 20 16 18   Temp: 99 F (37.2 C) (!) 100.7 F (38.2 C) 98.9 F (37.2 C) 98.2 F (36.8 C)  TempSrc: Oral Oral    SpO2: 97% 100% 99% 100%  Weight:  98.6 kg    Height:       Vitals and nursing note reviewed.  Constitutional:      General: She is not in acute distress.    Appearance: She is obese. She is not toxic-appearing.  HENT:     Head: Normocephalic and atraumatic.     Mouth/Throat:     Mouth: Mucous membranes are moist.     Pharynx: Oropharynx is clear.  Eyes:     Conjunctiva/sclera: Conjunctivae normal.     Pupils: Pupils are equal, round, and reactive to light.  Cardiovascular:     Rate and Rhythm: Normal rate and regular rhythm.     Heart sounds: No murmur heard. Pulmonary:     Effort: Pulmonary effort is normal. No respiratory distress.     Breath sounds: Normal breath sounds. No wheezing, rhonchi or rales.  Abdominal:     General: Bowel sounds are normal. There is no distension.     Palpations: Abdomen is soft.     Tenderness: There is abdominal tenderness (Bilateral lower panus TTP,). There is no guarding.  Musculoskeletal:     Right lower leg: No edema.     Left lower leg: No edema.  Skin:    General: Skin is warm and dry.  Neurological:     Mental Status: She is alert.      Comments:  Patient is alert and oriented No focal weakness noted.  No facial asymmetry or dysarthria. Repetitive motions of the tongue and mouth  Psychiatric:        Mood and Affect: Mood normal.        Behavior: Behavior normal. Behavior is cooperative.       Data Reviewed:  There are no new results to review at this time.  Family Communication:   Disposition: Status is: Inpatient Remains inpatient appropriate because: On IV antibiotics for ESBL E. coli UTI  Planned Discharge Destination: Home    Time spent: 32 minutes  Author: Collier Bullock, MD 07/23/2022 12:39 PM  For on call review [email or web address].

## 2022-07-24 DIAGNOSIS — Z1612 Extended spectrum beta lactamase (ESBL) resistance: Secondary | ICD-10-CM | POA: Diagnosis not present

## 2022-07-24 DIAGNOSIS — N390 Urinary tract infection, site not specified: Secondary | ICD-10-CM

## 2022-07-24 DIAGNOSIS — B9620 Unspecified Escherichia coli [E. coli] as the cause of diseases classified elsewhere: Secondary | ICD-10-CM | POA: Diagnosis not present

## 2022-07-24 DIAGNOSIS — N170 Acute kidney failure with tubular necrosis: Secondary | ICD-10-CM | POA: Diagnosis not present

## 2022-07-24 DIAGNOSIS — R6520 Severe sepsis without septic shock: Secondary | ICD-10-CM | POA: Diagnosis not present

## 2022-07-24 DIAGNOSIS — A419 Sepsis, unspecified organism: Secondary | ICD-10-CM | POA: Diagnosis not present

## 2022-07-24 DIAGNOSIS — N10 Acute tubulo-interstitial nephritis: Secondary | ICD-10-CM | POA: Diagnosis not present

## 2022-07-24 LAB — BASIC METABOLIC PANEL
Anion gap: 9 (ref 5–15)
BUN: 10 mg/dL (ref 8–23)
CO2: 26 mmol/L (ref 22–32)
Calcium: 8.6 mg/dL — ABNORMAL LOW (ref 8.9–10.3)
Chloride: 108 mmol/L (ref 98–111)
Creatinine, Ser: 0.85 mg/dL (ref 0.44–1.00)
GFR, Estimated: >60 mL/min (ref >60)
Glucose, Bld: 97 mg/dL (ref 70–99)
Potassium: 4 mmol/L (ref 3.5–5.1)
Sodium: 139 mmol/L (ref 135–145)

## 2022-07-24 LAB — CBC WITH DIFFERENTIAL/PLATELET
Abs Immature Granulocytes: 0.06 10*3/uL (ref 0.00–0.07)
Basophils Absolute: 0 10*3/uL (ref 0.0–0.1)
Basophils Relative: 0 %
Eosinophils Absolute: 0.1 10*3/uL (ref 0.0–0.5)
Eosinophils Relative: 2 %
HCT: 31.8 % — ABNORMAL LOW (ref 36.0–46.0)
Hemoglobin: 10 g/dL — ABNORMAL LOW (ref 12.0–15.0)
Immature Granulocytes: 1 %
Lymphocytes Relative: 17 %
Lymphs Abs: 1.3 10*3/uL (ref 0.7–4.0)
MCH: 31.6 pg (ref 26.0–34.0)
MCHC: 31.4 g/dL (ref 30.0–36.0)
MCV: 100.6 fL — ABNORMAL HIGH (ref 80.0–100.0)
Monocytes Absolute: 0.6 10*3/uL (ref 0.1–1.0)
Monocytes Relative: 8 %
Neutro Abs: 5.3 10*3/uL (ref 1.7–7.7)
Neutrophils Relative %: 72 %
Platelets: 147 10*3/uL — ABNORMAL LOW (ref 150–400)
RBC: 3.16 MIL/uL — ABNORMAL LOW (ref 3.87–5.11)
RDW: 13.9 % (ref 11.5–15.5)
WBC: 7.3 10*3/uL (ref 4.0–10.5)
nRBC: 0 % (ref 0.0–0.2)

## 2022-07-24 MED ORDER — METOPROLOL SUCCINATE ER 25 MG PO TB24
25.0000 mg | ORAL_TABLET | Freq: Every day | ORAL | Status: DC
  Administered 2022-07-24 – 2022-07-26 (×3): 25 mg via ORAL
  Filled 2022-07-24 (×3): qty 1

## 2022-07-24 NOTE — Consult Note (Signed)
NAME: Shelly Freeman  DOB: 19-Apr-1951  MRN: [id number]  Date/Time: 07/24/2022 9:48 AM  REQUESTING PROVIDER: Dr.Agbata Subjective:  REASON FOR CONSULT: ESBL Ecoli UTI ? Shelly Freeman is a 72 y.o. female with a history of SLE, collagen vascular disease, COPD, sleep apnea, recurrent UTI presented on 07/20/22 with severe nausea vomiting and diarrhea and body ache of 1 day duration.  She states she also had fever of up to 102.8 after 2 days.  She had an in urine and bilateral flank pain for a few days. In the ED vitals BP of 153/62, temperature 98.4, pulse 88, respiratory rate 18 and sats 100%.  Temperature then increased 102.8 WBC 21.4, Hb 11.6, creatinine 1.35.  Platelet 169.  UA noted to have more than 50 WBC.  Urine culture was sent.  Blood culture was sent. Stool was sent for C. difficile and GI panel PCR and they both were negative.  CT abdomen and pelvis revealed asymmetric right bladder wall thickening with adjacent stranding.  Also noted was prominence of right renal collecting system with urothelial enhancement.  There was right-sided perinephric stranding.  No stone There was evidence of prior supracervical hysterectomy.  There was also a round enhancing lesion within the vaginal cuff cervix with fluid in the vaginal canal this was measuring 3.20 3.5 to 3.8 cm. Patient was initially started on vancomycin ceftriaxone and Flagyl on 07/20/2022.  On 07/22/2022 because the urine culture came back as ESBL E. coli she was switched to a Carbapenem.   Past Medical History:  Diagnosis Date   Anginal pain ([place])    Arthritis    Collagen vascular disease ([place])    COPD (chronic obstructive pulmonary disease) (HCC)    DDD (degenerative disc disease), lumbar 05/14/2016   Depression    Dizziness    Dyspnea    GERD (gastroesophageal reflux disease)    Heart valve problem    per grandaughter need surgery but they told her may not survive;   History of kidney stones    Hypertension     Hypothyroidism    Lupus ([place])    Neuromuscular disorder ([place])    Sleep apnea    no CPAP   Thyroid disease    Vitamin D deficiency 05/14/2016    Past Surgical History:  Procedure Laterality Date   APPENDECTOMY     BREAST BIOPSY Left    neg   CATARACT EXTRACTION W/PHACO Left 11/13/2021   Procedure: CATARACT EXTRACTION PHACO AND INTRAOCULAR LENS PLACEMENT (IOC) LEFT 3.63 00:35.9;  Surgeon: Eulogio Bear, MD;  Location: [place];  Service: Ophthalmology;  Laterality: Left;   CATARACT EXTRACTION W/PHACO Right 12/04/2021   Procedure: CATARACT EXTRACTION PHACO AND INTRAOCULAR LENS PLACEMENT (IOC) RIGHT;  Surgeon: Eulogio Bear, MD;  Location: [facility];  Service: Ophthalmology;  Laterality: Right;  3.11 00:32.6   CESAREAN SECTION     x 2   COLONOSCOPY N/A 08/24/2021   Procedure: COLONOSCOPY;  Surgeon: [name], DO;  [facility] ENDOSCOPY;  Service: Gastroenterology;  Laterality: N/A;  SPANISH INTERPRETER   COLONOSCOPY WITH PROPOFOL N/A 07/01/2017   Procedure: COLONOSCOPY WITH PROPOFOL;  Surgeon: Manya Silvas, MD;  Location: [facility] ENDOSCOPY;  Service: Endoscopy;  Laterality: N/A;   ESOPHAGOGASTRODUODENOSCOPY (EGD) WITH PROPOFOL N/A 07/01/2017   Procedure: ESOPHAGOGASTRODUODENOSCOPY (EGD) WITH PROPOFOL;  Surgeon: Manya Silvas, MD;  Location: [facility] ENDOSCOPY;  Service: Endoscopy;  Laterality: N/A;   GASTRIC BYPASS     LIPOMA EXCISION Left 06/14/2022   Procedure: EXCISION LIPOMA, left  chest wall;  Surgeon: Jules Husbands, MD;  Location: [facility] ORS;  Service: General;  Laterality: Left;   LITHOTRIPSY     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   Procedure: TOTAL KNEE ARTHROPLASTY;  Surgeon: Hessie Knows, MD;  Location: [facility] ORS;  Service: Orthopedics;  Laterality: Left;   TUBAL LIGATION      Social History   Socioeconomic History   Marital status: Divorced    Spouse name: Not on file   Number of children: Not on file   Years of  education: Not on file   Highest education level: Not on file  Occupational History   Not on file  Tobacco Use   Smoking status: Never   Smokeless tobacco: Never  Vaping Use   Vaping Use: Never used  Substance and Sexual Activity   Alcohol use: No   Drug use: No   Sexual activity: Not Currently    Birth control/protection: Abstinence, Post-menopausal  Other Topics Concern   Not on file  Social History Narrative   Lives alone   Social Determinants of Health   Financial Resource Strain: Not on file  Food Insecurity: Patient Unable To Answer (07/20/2022)   Hunger Vital Sign    Worried About Running Out of Food in the Last Year: Patient unable to answer    Ran Out of Food in the Last Year: Patient unable to answer  Transportation Needs: Patient Unable To Answer (07/20/2022)   PRAPARE - Transportation    Lack of Transportation (Medical): Patient unable to answer    Lack of Transportation (Non-Medical): Patient unable to answer  Physical Activity: Not on file  Stress: Not on file  Social Connections: Not on file  Intimate Partner Violence: Patient Unable To Answer (07/20/2022)   Humiliation, Afraid, Rape, and Kick questionnaire    Fear of Current or Ex-Partner: Patient unable to answer    Emotionally Abused: Patient unable to answer    Physically Abused: Patient unable to answer    Sexually Abused: Patient unable to answer    Family History  Problem Relation Age of Onset   Breast cancer Maternal Aunt    Breast cancer Cousin    Allergies  Allergen Reactions   Cholestyramine Other (See Comments)    severe   I? Current Facility-Administered Medications  Medication Dose Route Frequency Provider Last Rate Last Admin   acetaminophen (TYLENOL) tablet 1,000 mg  1,000 mg Oral Q6H PRN Sharion Settler, NP   1,000 mg at 07/24/22 0502   busPIRone (BUSPAR) tablet 15 mg  15 mg Oral BID Jose Persia, MD   15 mg at 07/24/22 0905   enoxaparin (LOVENOX) injection 50 mg  0.5 mg/kg  Subcutaneous Q24H Jose Persia, MD   50 mg at 07/23/22 2117   escitalopram (LEXAPRO) tablet 20 mg  20 mg Oral Daily Jose Persia, MD   20 mg at [id number]   folic acid (FOLVITE) tablet 1 mg  1 mg Oral Daily Jose Persia, MD   1 mg at 07/24/22 0905   gabapentin (NEURONTIN) capsule 300 mg  300 mg Oral TID Jose Persia, MD   300 mg at 07/24/22 0905   ipratropium-albuterol (DUONEB) 0.5-2.5 (3) MG/3ML nebulizer solution 3 mL  3 mL Nebulization Q4H PRN Jose Persia, MD       levothyroxine (SYNTHROID) tablet 75 mcg  75 mcg Oral Q0600 Jose Persia, MD   75 mcg at 07/24/22 0502   loratadine (CLARITIN) tablet 10 mg  10 mg Oral Daily Jose Persia, MD  10 mg at 07/24/22 0905   meropenem (MERREM) 1 g in sodium chloride 0.9 % 100 mL IVPB  1 g Intravenous Q8H Agbata, Tochukwu, MD 200 mL/hr at 07/24/22 0503 1 g at 07/24/22 0503   ondansetron (ZOFRAN) tablet 4 mg  4 mg Oral Q6H PRN Jose Persia, MD       Or   ondansetron (ZOFRAN) injection 4 mg  4 mg Intravenous Q6H PRN Jose Persia, MD   4 mg at 07/22/22 0815   oxybutynin (DITROPAN-XL) 24 hr tablet 5 mg  5 mg Oral QHS Jose Persia, MD   5 mg at 07/23/22 2117   pantoprazole (PROTONIX) EC tablet 40 mg  40 mg Oral Daily Agbata, Tochukwu, MD   40 mg at 07/24/22 0905   phenol (CHLORASEPTIC) mouth spray 1 spray  1 spray Mouth/Throat PRN Agbata, Tochukwu, MD   1 spray at 07/22/22 1012   QUEtiapine (SEROQUEL) tablet 50 mg  50 mg Oral QHS Jose Persia, MD   50 mg at 07/23/22 2117   sodium chloride flush (NS) 0.9 % injection 3 mL  3 mL Intravenous Q12H Jose Persia, MD   3 mL at 07/24/22 0906   traMADol (ULTRAM) tablet 50 mg  50 mg Oral Q6H PRN Agbata, Tochukwu, MD   50 mg at 07/23/22 2117   traZODone (DESYREL) tablet 125 mg  125 mg Oral QHS PRN Jose Persia, MD   125 mg at 07/23/22 2117     Abtx:  Anti-infectives (From admission, onward)    Start     Dose/Rate Route Frequency Ordered Stop   07/22/22 0930  meropenem (MERREM)  1 g in sodium chloride 0.9 % 100 mL IVPB        1 g 200 mL/hr over 30 Minutes Intravenous Every 8 hours 07/22/22 0834     07/22/22 0915  ertapenem (INVANZ) 1,000 mg in sodium chloride 0.9 % 100 mL IVPB  Status:  Discontinued        1 g 200 mL/hr over 30 Minutes Intravenous Every 24 hours 07/22/22 0815 07/22/22 0834   07/21/22 0945  vancomycin (VANCOREADY) IVPB 2000 mg/400 mL        2,000 mg 200 mL/hr over 120 Minutes Intravenous  Once 07/21/22 0847 07/21/22 1023   07/21/22 0930  vancomycin (VANCOCIN) IVPB 1000 mg/200 mL premix  Status:  Discontinued        1,000 mg 200 mL/hr over 60 Minutes Intravenous  Once 07/21/22 0841 07/21/22 0847   07/20/22 2200  cefTRIAXone (ROCEPHIN) 2 g in sodium chloride 0.9 % 100 mL IVPB  Status:  Discontinued        2 g 200 mL/hr over 30 Minutes Intravenous Every 24 hours 07/20/22 1549 07/22/22 0815   07/20/22 1415  ceFEPIme (MAXIPIME) 2 g in sodium chloride 0.9 % 100 mL IVPB        2 g 200 mL/hr over 30 Minutes Intravenous  Once 07/20/22 1403 07/20/22 1456   07/20/22 1415  metroNIDAZOLE (FLAGYL) IVPB 500 mg        500 mg 100 mL/hr over 60 Minutes Intravenous  Once 07/20/22 1403 07/20/22 1620   07/20/22 1415  vancomycin (VANCOCIN) IVPB 1000 mg/200 mL premix  Status:  Discontinued        1,000 mg 200 mL/hr over 60 Minutes Intravenous  Once 07/20/22 1403 07/20/22 1601       REVIEW OF SYSTEMS:  Const: negative fever, negative chills, negative weight loss Eyes: negative diplopia or visual changes, negative eye pain ENT: negative  coryza, negative sore throat Resp: negative cough, hemoptysis, dyspnea Cards: negative for chest pain, palpitations, lower extremity edema GU: negative for frequency, dysuria and hematuria GI: Negative for abdominal pain, diarrhea, bleeding, constipation Skin: negative for rash and pruritus Heme: negative for easy bruising and gum/nose bleeding MS: negative for myalgias, arthralgias, back pain and muscle  weakness Neurolo:negative for headaches, dizziness, vertigo, memory problems  Psych: negative for feelings of anxiety, depression  Endocrine: negative for thyroid, diabetes Allergy/Immunology- cholestyramine Objective:  VITALS:  BP (!) 126/56 (BP Location: Right Arm)   Pulse 60   Temp 98 F (36.7 C) (Oral)   Resp 17   Ht 5\' 1"  (1.549 m)   Wt 98.6 kg   SpO2 98%   BMI 41.07 kg/m   PHYSICAL EXAM:  General: Alert, cooperative, no distress, appears stated age.  Head: Normocephalic, without obvious abnormality, atraumatic. Eyes: Conjunctivae clear, anicteric sclerae. Pupils are equal ENT Nares normal. No drainage or sinus tenderness. Lips, mucosa, and tongue normal. No Thrush Neck: Supple, symmetrical, no adenopathy, thyroid: non tender no carotid bruit and no JVD. Back: No CVA tenderness. Lungs: Clear to auscultation bilaterally. No Wheezing or Rhonchi. No rales. Heart: Regular rate and rhythm, no murmur, rub or gallop. Abdomen: Soft, non-tender,not distended. Bowel sounds normal. No masses Extremities: atraumatic, no cyanosis. No edema. No clubbing Skin: No rashes or lesions. Or bruising Lymph: Cervical, supraclavicular normal. Neurologic: Grossly non-focal Pertinent Labs Lab Results CBC    Latest Ref Rng & Units 07/24/2022    4:38 AM 07/22/2022    4:44 AM 07/21/2022    4:20 AM  CBC  WBC 4.0 - 10.5 K/uL 7.3  13.2  20.3   Hemoglobin 12.0 - 15.0 g/dL 10.0  10.0  10.9   Hematocrit 36.0 - 46.0 % 31.8  31.0  34.5   Platelets 150 - 400 K/uL 147  119  115        Latest Ref Rng & Units 07/24/2022    4:38 AM 07/22/2022    4:44 AM 07/21/2022    4:20 AM  CMP  Glucose 70 - 99 mg/dL 97  101  101   BUN 8 - 23 mg/dL 10  16  20    Creatinine 0.44 - 1.00 mg/dL 0.85  0.98  1.25   Sodium 135 - 145 mmol/L 139  135  134   Potassium 3.5 - 5.1 mmol/L 4.0  3.6  3.8   Chloride 98 - 111 mmol/L 108  109  103   CO2 22 - 32 mmol/L 26  20  21    Calcium 8.9 - 10.3 mg/dL 8.6  7.5  7.8        Microbiology: Recent Results (from the past 240 hour(s))  Resp panel by RT-PCR (RSV, Flu A&B, Covid) Anterior Nasal Swab     Status: None   Collection Time: 07/20/22 12:40 PM   Specimen: Anterior Nasal Swab  Result Value Ref Range Status   SARS Coronavirus 2 by RT PCR NEGATIVE NEGATIVE Final    Comment: (NOTE) SARS-CoV-2 target nucleic acids are NOT DETECTED.  The SARS-CoV-2 RNA is generally detectable in upper respiratory specimens during the acute phase of infection. The lowest concentration of SARS-CoV-2 viral copies this assay can detect is 138 copies/mL. A negative result does not preclude SARS-Cov-2 infection and should not be used as the sole basis for treatment or other patient management decisions. A negative result may occur with  improper specimen collection/handling, submission of specimen other than nasopharyngeal swab, presence of viral mutation(s)  within the areas targeted by this assay, and inadequate number of viral copies(<138 copies/mL). A negative result must be combined with clinical observations, patient history, and epidemiological information. The expected result is Negative.  Fact Sheet for Patients:  EntrepreneurPulse.com.au  Fact Sheet for Healthcare Providers:  IncredibleEmployment.be  This test is no t yet approved or cleared by the [place] FDA and  has been authorized for detection and/or diagnosis of SARS-CoV-2 by FDA under an Emergency Use Authorization (EUA). This EUA will remain  in effect (meaning this test can be used) for the duration of the COVID-19 declaration under Section 564(b)(1) of the Act, 21 U.S.C.section 360bbb-3(b)(1), unless the authorization is terminated  or revoked sooner.       Influenza A by PCR NEGATIVE NEGATIVE Final   Influenza B by PCR NEGATIVE NEGATIVE Final    Comment: (NOTE) The Xpert Xpress SARS-CoV-2/FLU/RSV plus assay is intended as an aid in the diagnosis of  influenza from Nasopharyngeal swab specimens and should not be used as a sole basis for treatment. Nasal washings and aspirates are unacceptable for Xpert Xpress SARS-CoV-2/FLU/RSV testing.  Fact Sheet for Patients: EntrepreneurPulse.com.au  Fact Sheet for Healthcare Providers: IncredibleEmployment.be  This test is not yet approved or cleared by the [place] FDA and has been authorized for detection and/or diagnosis of SARS-CoV-2 by FDA under an Emergency Use Authorization (EUA). This EUA will remain in effect (meaning this test can be used) for the duration of the COVID-19 declaration under Section 564(b)(1) of the Act, 21 U.S.C. section 360bbb-3(b)(1), unless the authorization is terminated or revoked.     Resp Syncytial Virus by PCR NEGATIVE NEGATIVE Final    Comment: (NOTE) Fact Sheet for Patients: EntrepreneurPulse.com.au  Fact Sheet for Healthcare Providers: IncredibleEmployment.be  This test is not yet approved or cleared by the [place] FDA and has been authorized for detection and/or diagnosis of SARS-CoV-2 by FDA under an Emergency Use Authorization (EUA). This EUA will remain in effect (meaning this test can be used) for the duration of the COVID-19 declaration under Section 564(b)(1) of the Act, 21 U.S.C. section 360bbb-3(b)(1), unless the authorization is terminated or revoked.  Performed at [facility], [address] [place], [place] [address]   Blood culture (routine x 2)     Status: None (Preliminary result)   Collection Time: 07/20/22 12:40 PM   Specimen: BLOOD  Result Value Ref Range Status   Specimen Description BLOOD BLOOD RIGHT FOREARM  Final   Special Requests   Final    BOTTLES DRAWN AEROBIC AND ANAEROBIC Blood Culture adequate volume   Culture   Final    NO GROWTH 4 DAYS Performed at [facility], [address], [place], [place] [address]     Report Status PENDING  Incomplete  Blood culture (routine x 2)     Status: None (Preliminary result)   Collection Time: 07/20/22 12:40 PM   Specimen: BLOOD  Result Value Ref Range Status   Specimen Description BLOOD LEFT ANTECUBITAL  Final   Special Requests   Final    BOTTLES DRAWN AEROBIC AND ANAEROBIC Blood Culture adequate volume   Culture   Final    NO GROWTH 4 DAYS Performed at [facility], [address] [place], [place] [address]    Report Status PENDING  Incomplete  Urine Culture (for pregnant, neutropenic or urologic patients or patients with an indwelling urinary catheter)     Status: Abnormal   Collection Time: 07/20/22  2:19 PM  Specimen: Urine, Clean Catch  Result Value Ref Range Status   Specimen Description   Final    URINE, CLEAN CATCH Performed at [facility], [address], [place], [place] [address]    Special Requests   Final    NONE Performed at [facility], [address] [place], [place] [address]    Culture (A)  Final    80,000 COLONIES/mL ESCHERICHIA COLI Confirmed Extended Spectrum Beta-Lactamase Producer (ESBL).  In bloodstream infections from ESBL organisms, carbapenems are preferred over piperacillin/tazobactam. They are shown to have a lower risk of mortality.    Report Status 07/22/2022 FINAL  Final   Organism ID, Bacteria ESCHERICHIA COLI (A)  Final      Susceptibility   Escherichia coli - MIC*    AMPICILLIN >=32 RESISTANT Resistant     CEFAZOLIN >=64 RESISTANT Resistant     CEFEPIME >=32 RESISTANT Resistant     CEFTRIAXONE >=64 RESISTANT Resistant     CIPROFLOXACIN >=4 RESISTANT Resistant     GENTAMICIN <=1 SENSITIVE Sensitive     IMIPENEM <=0.25 SENSITIVE Sensitive     NITROFURANTOIN <=16 SENSITIVE Sensitive     TRIMETH/SULFA <=20 SENSITIVE Sensitive     AMPICILLIN/SULBACTAM >=32 RESISTANT Resistant     PIP/TAZO 16 SENSITIVE Sensitive     * 80,000 COLONIES/mL ESCHERICHIA COLI  Gastrointestinal Panel  by PCR , Stool     Status: None   Collection Time: 07/21/22 10:45 AM   Specimen: Stool  Result Value Ref Range Status   Campylobacter species NOT DETECTED NOT DETECTED Final   Plesimonas shigelloides NOT DETECTED NOT DETECTED Final   Salmonella species NOT DETECTED NOT DETECTED Final   Yersinia enterocolitica NOT DETECTED NOT DETECTED Final   Vibrio species NOT DETECTED NOT DETECTED Final   Vibrio cholerae NOT DETECTED NOT DETECTED Final   Enteroaggregative E coli (EAEC) NOT DETECTED NOT DETECTED Final   Enteropathogenic E coli (EPEC) NOT DETECTED NOT DETECTED Final   Enterotoxigenic E coli (ETEC) NOT DETECTED NOT DETECTED Final   Shiga like toxin producing E coli (STEC) NOT DETECTED NOT DETECTED Final   Shigella/Enteroinvasive E coli (EIEC) NOT DETECTED NOT DETECTED Final   Cryptosporidium NOT DETECTED NOT DETECTED Final   Cyclospora cayetanensis NOT DETECTED NOT DETECTED Final   Entamoeba histolytica NOT DETECTED NOT DETECTED Final   Giardia lamblia NOT DETECTED NOT DETECTED Final   Adenovirus F40/41 NOT DETECTED NOT DETECTED Final   Astrovirus NOT DETECTED NOT DETECTED Final   Norovirus GI/GII NOT DETECTED NOT DETECTED Final   Rotavirus A NOT DETECTED NOT DETECTED Final   Sapovirus (I, II, IV, and V) NOT DETECTED NOT DETECTED Final    Comment: Performed at [facility], [address] [place], [place] [address]    IMAGING RESULTS: CT abdomen and pelvis as noted above. I have personally reviewed the films ? Impression/Recommendation Acute pyelonephritis on the right side with sepsis presentation. ESBL E. coli in urine culture Initially started on ceftriaxone and then switched to carbapenem 48 hours later.  So she is day 3 of appropriate antibiotic therapy.  She is feeling better.  Leukocytosis has resolved.  As blood culture is negative we will Treat her with minimum of 7 days of antibiotic therapy.  Bactrim susceptible to the ESBL E. coli so that is an option on  discharge. The CT abdomen and pelvis also showed a collection at the vaginal cuff and possible in the cervix. Recommend GYN consult for pelvic examination/vaginal ultrasound. Will also check a bladder  scan for incomplete emptying.  AKI has resolved.  Anemia  Mild thrombocytopenia  Lupus currently not on any medication.  Followed by rheumatologist at [facility].  History of left TKA  Anxiety/depression on multiple medication  Hypothyroidism on Synthroid. ___________________________________________________ Discussed with patient, requesting provider Note:  This document was prepared using Dragon voice recognition software and may include unintentional dictation errors.

## 2022-07-24 NOTE — TOC Progression Note (Signed)
Transition of Care [facility]) - Progression Note    Patient Details  Name: Raylie Bertini MRN: [id number] Date of Birth: 03/29/1951  Transition of Care [facility]) CM/SW Contact  Laurena Slimmer, RN Phone Number: 07/24/2022, 10:04 PM  Clinical Narrative:    Case reviewed for DME needs and changes in discharge disposition.         Expected Discharge Plan and Services                                               Social Determinants of Health (SDOH) Interventions SDOH Screenings   Food Insecurity: Patient Unable To Answer (07/20/2022)  Housing: Low Risk  (07/20/2022)  Transportation Needs: Patient Unable To Answer (07/20/2022)  Utilities: Patient Unable To Answer (07/20/2022)  Alcohol Screen: Low Risk  (12/09/2017)  Tobacco Use: Low Risk  (07/20/2022)    Readmission Risk Interventions    01/28/2021   12:19 PM  Readmission Risk Prevention Plan  Transportation Screening Complete  PCP or Specialist Appt within 3-5 Days Complete  HRI or [place] Complete  Social Work Consult for [place] Planning/Counseling Complete  Palliative Care Screening Not Applicable  Medication Review Press photographer) Complete

## 2022-07-24 NOTE — Progress Notes (Addendum)
Progress Note   Patient: Shelly Freeman [id number] DOB: 11/16/1950 DOA: 07/20/2022     4 DOS: the patient was seen and examined on 07/24/2022   Brief hospital course:  Meghna Chick is a 72 y.o. female with medical history significant of recurrent UTI, SLE, collagen vascular disease, hypothyroidism, depression/anxiety, COPD, sleep apnea, who presents to the ED due to vomiting and diarrhea.   Ms. Authement states that she woke up around 1 AM today and had severe nausea, vomiting and diarrhea that has persisted innumerable times throughout the day.  She endorses fevers and bilateral lower abdominal pain.  She endorses dysuria but denies any urinary frequency.  She notes that her vomit initially appeared normal, but subsequently became bright red and she was experiencing chest pain.  Her chest pain has self resolved at this time.  She denies any shortness of breath or palpitations.  She does not note any of the symptoms before last night.  She endorses bilateral feet swelling that has been going on for few days.   ED Course:  On arrival to the ED, patient was hypertensive at 153/62 with heart rate of 85.  She was saturating at 100% on 2 L of supplemental oxygen.  Per nursing report, PA-C ER and placed patient on oxygen after O2 saturation was 85% at home.  She was afebrile at 98.4.  Initial workup notable for WBC of 21.4, hemoglobin of 11.6, sodium of 133, bicarb 20, glucose 154, BUN 24, creatinine 1.35, and GFR 42.  BNP elevated at 1200, however troponin negative x 2.  Urinalysis was obtained that demonstrates large leukocytes and positive nitrites and many bacteria.  CT of the abdomen was obtained that demonstrated pyelonephritis of the right.  Due to concern for urosepsis patient was started on cefepime, vancomycin and Flagyl.  TRH contacted for admission.       Assessment and Plan: sepsis ([place]) from acute pyelonephritis As evidenced by tachycardia, marked leukocytosis, lactic acid of  4.4 which showed a downward trend associated with AKI and acute hypoxic respiratory failure (room air pulse oximetry was 85% requiring oxygen supplementation at 2 L) Patient continues to have a fever with a Tmax of 100.8. Blood pressure has improved and leukocytosis has normalized She has been weaned off oxygen CT abdomen demonstrated right-sided pyelonephritis.   Urine culture yielded 80,000 CFU of ESBL E. Coli Continue IV meropenem  # 3 Will consult ID for recommendations on duration of antibiotic therapy       AKI (acute kidney injury) ([place]) Secondary to ATN from volume depletion related to GI losses from vomiting/diarrhea and sepsis.  No evidence of hydronephrosis on image Baseline serum creatinine is 1.05 and on admission it was 1.35 Renal function has improved with hydration and is back to normal       Acute respiratory failure with hypoxia (HCC) Patient was noted to have room air pulse oximetry of 85% and initially required oxygen supplementation at 2 L Patient has been weaned off oxygen         Acute gastroenteritis Stool studies are negative Supportive care     Hematemesis (resolved) Patient states her vomit was initially normal but then turned bright red after numerous episodes.  Most consistent with Mallory-Weiss tear.   No additional episodes since arrival to the ED.  Noted to have a drop in her H&H which may be hemo dilutional Switch PPI to oral     Severe recurrent major depression with psychotic features [facility]) Per chart review, patient was previously  treated with olanzapine, but is not currently taking any longer. Continue home BuSpar and trazodone Outpatient follow-up with PCP     COPD (chronic obstructive pulmonary disease) (HCC) No wheezing on examination and patient declines cough. Continue home bronchodilators     Hypothyroidism - Continue home levothyroxine   Essential hypertension - Resume Toprol-XL 25 mg   Lupus (HCC) Not currently on any  DMARDs or immunosuppressants       Morbid obesity (BMI 41) Complicates overall prognosis and care     Abnormal CT scan finding Patient noted to have a rounded enhancing lesion within the vaginal cuff/cervix, with fluid in the vaginal canal, measuring 3.2 x 3.5 x 3.8 cm. This could represent cervicitis but recommend follow-up with gyn to exclude underlying neoplasm. Appreciate GYN input Pelvic ultrasound showed rounded soft tissue in the vaginal cuff corresponding to the density seen on the CT. Follow-up with GYN as an outpatient            Subjective: Patient is seen and examined at the bedside.  Continues to spike low-grade fever but feels better.  Requested for her diet to be advanced  Physical Exam: Vitals:   07/23/22 1957 07/24/22 0034 07/24/22 0432 07/24/22 0835  BP: (!) 136/53 (!) 150/75 (!) 131/50 (!) 126/56  Pulse: 74 76 89 60  Resp: 18 18 18 17   Temp: 99.1 F (37.3 C) 99.9 F (37.7 C) (!) 100.8 F (38.2 C) 98 F (36.7 C)  TempSrc: Oral Oral Oral Oral  SpO2: 93%  95% 98%  Weight:   98.6 kg   Height:       Vitals and nursing note reviewed.  Constitutional:      General: She is not in acute distress.    Appearance: She is obese. She is not toxic-appearing.  HENT:     Head: Normocephalic and atraumatic.     Mouth/Throat:     Mouth: Mucous membranes are moist.     Pharynx: Oropharynx is clear.  Eyes:     Conjunctiva/sclera: Conjunctivae normal.     Pupils: Pupils are equal, round, and reactive to light.  Cardiovascular:     Rate and Rhythm: Normal rate and regular rhythm.     Heart sounds: No murmur heard. Pulmonary:     Effort: Pulmonary effort is normal. No respiratory distress.     Breath sounds: Normal breath sounds. No wheezing, rhonchi or rales.  Abdominal:     General: Bowel sounds are normal. There is no distension.     Palpations: Abdomen is soft.     Tenderness: There is abdominal tenderness (suprapubic). There is no guarding.   Musculoskeletal:     Right lower leg: No edema.     Left lower leg: No edema.  Skin:    General: Skin is warm and dry.  Neurological:     Mental Status: She is alert.     Comments:  Patient is alert and oriented No focal weakness noted.  No facial asymmetry or dysarthria. Repetitive motions of the tongue and mouth  Psychiatric:        Mood and Affect: Mood normal.        Behavior: Behavior normal. Behavior is cooperative.    Data Reviewed: Labs reviewed.  Within normal limits There are no new results to review at this time.  Family Communication:   Disposition: Status is: Inpatient Remains inpatient appropriate because: Remains on IV antibiotics for ESBL E. coli UTI  Planned Discharge Destination: Home    Time spent: 59  minutes  Author: Collier Bullock, MD 07/24/2022 12:12 PM  For on call review [email or web address].

## 2022-07-25 DIAGNOSIS — Z1612 Extended spectrum beta lactamase (ESBL) resistance: Secondary | ICD-10-CM | POA: Diagnosis not present

## 2022-07-25 DIAGNOSIS — N390 Urinary tract infection, site not specified: Secondary | ICD-10-CM | POA: Diagnosis not present

## 2022-07-25 DIAGNOSIS — E039 Hypothyroidism, unspecified: Secondary | ICD-10-CM | POA: Diagnosis not present

## 2022-07-25 DIAGNOSIS — J449 Chronic obstructive pulmonary disease, unspecified: Secondary | ICD-10-CM | POA: Diagnosis not present

## 2022-07-25 DIAGNOSIS — N10 Acute tubulo-interstitial nephritis: Secondary | ICD-10-CM | POA: Diagnosis not present

## 2022-07-25 DIAGNOSIS — A419 Sepsis, unspecified organism: Secondary | ICD-10-CM | POA: Diagnosis not present

## 2022-07-25 DIAGNOSIS — B9620 Unspecified Escherichia coli [E. coli] as the cause of diseases classified elsewhere: Secondary | ICD-10-CM | POA: Diagnosis not present

## 2022-07-25 LAB — CULTURE, BLOOD (ROUTINE X 2)
Culture: NO GROWTH
Culture: NO GROWTH
Special Requests: ADEQUATE
Special Requests: ADEQUATE

## 2022-07-25 MED ORDER — ACETAMINOPHEN 325 MG PO TABS
650.0000 mg | ORAL_TABLET | Freq: Four times a day (QID) | ORAL | Status: DC | PRN
  Administered 2022-07-25 – 2022-07-26 (×2): 650 mg via ORAL
  Filled 2022-07-25 (×2): qty 2

## 2022-07-25 MED ORDER — VITAMIN D 25 MCG (1000 UNIT) PO TABS
1000.0000 [IU] | ORAL_TABLET | Freq: Every day | ORAL | Status: DC
  Administered 2022-07-25 – 2022-07-26 (×2): 1000 [IU] via ORAL
  Filled 2022-07-25 (×2): qty 1

## 2022-07-25 MED ORDER — IPRATROPIUM-ALBUTEROL 0.5-2.5 (3) MG/3ML IN SOLN: 3.0000 mL | Freq: Four times a day (QID) | RESPIRATORY_TRACT | Status: DC | PRN

## 2022-07-25 MED ORDER — DICLOFENAC SODIUM 1 % EX GEL
2.0000 g | Freq: Four times a day (QID) | CUTANEOUS | Status: DC
  Administered 2022-07-25 – 2022-07-26 (×4): 2 g via TOPICAL
  Filled 2022-07-25: qty 100

## 2022-07-25 MED ORDER — IBUPROFEN 400 MG PO TABS: 400.0000 mg | ORAL_TABLET | Freq: Once | ORAL | Status: DC

## 2022-07-25 MED ORDER — CELECOXIB 100 MG PO CAPS
100.0000 mg | ORAL_CAPSULE | Freq: Two times a day (BID) | ORAL | Status: DC
  Administered 2022-07-25 – 2022-07-26 (×3): 100 mg via ORAL
  Filled 2022-07-25 (×3): qty 1

## 2022-07-25 MED ORDER — SODIUM CHLORIDE 0.9 % IV SOLN
1.0000 g | INTRAVENOUS | Status: DC
  Administered 2022-07-26: 1000 mg via INTRAVENOUS
  Filled 2022-07-25: qty 1

## 2022-07-25 MED ORDER — VITAMIN B-12 1000 MCG PO TABS
1000.0000 ug | ORAL_TABLET | Freq: Every day | ORAL | Status: DC
  Administered 2022-07-25 – 2022-07-26 (×2): 1000 ug via ORAL
  Filled 2022-07-25 (×2): qty 1

## 2022-07-25 NOTE — Progress Notes (Signed)
[place] [facility] [place] NAME: Shelly Freeman    MR#:  [id number]  DATE OF BIRTH:  [birth year]  SUBJECTIVE:   Patient complains of some dizziness. Had headache earlier. No family at bedside. Complains of generalized body ache. Did not want to work with PT.   VITALS:  Blood pressure (!) 118/59, pulse 73, temperature 99.1 F (37.3 C), temperature source Oral, resp. rate 16, height 5\' 1"  (1.549 m), weight 98.6 kg, SpO2 98 %.  PHYSICAL EXAMINATION:   GENERAL:  72 y.o.-year-old patient with no acute distress. obese LUNGS: Normal breath sounds bilaterally, no wheezing CARDIOVASCULAR: S1, S2 normal. No murmur   ABDOMEN: Soft, nontender, nondistended. Bowel sounds present.  EXTREMITIES: No  edema b/l.    NEUROLOGIC: nonfocal  patient is alert and awake SKIN: No obvious rash, lesion, or ulcer.   LABORATORY PANEL:  CBC Recent Labs  Lab 07/24/22 0438  WBC 7.3  HGB 10.0*  HCT 31.8*  PLT 147*    Chemistries  Recent Labs  Lab 07/20/22 1240 07/21/22 0420 07/24/22 0438  NA 133*   < > 139  K 3.9   < > 4.0  CL 99   < > 108  CO2 20*   < > 26  GLUCOSE 154*   < > 97  BUN 24*   < > 10  CREATININE 1.35*   < > 0.85  CALCIUM 8.5*   < > 8.6*  AST 35  --   --   ALT 17  --   --   ALKPHOS 76  --   --   BILITOT 1.0  --   --    < > = values in this interval not displayed.    Assessment and Plan  Sepsis ([place]) due to acute pyelonephritis --came in with tachycardia, marked leukocytosis, lactic acid of 4.4 which showed a downward trend associated with AKI and acute hypoxic respiratory failure (room air pulse oximetry was 85% requiring oxygen supplementation at 2 L) -- Blood pressure has improved and leukocytosis has normalized --She has been weaned off oxygen --CT abdomen demonstrated right-sided pyelonephritis.   --Urine culture yielded 80,000 CFU of ESBL E. Coli -- IV meropenem  # 4--change to po bactrim from am per ID recommendation (total 7  days)   AKI (acute kidney injury) ([place]) --Secondary to ATN from volume depletion related to GI losses from vomiting/diarrhea and sepsis.  --No evidence of hydronephrosis on image --Baseline serum creatinine is 1.05 and on admission it was 1.35 --Renal function has improved with hydration and is back to normal   Acute respiratory failure with hypoxia (HCC) COPD (chronic obstructive pulmonary disease) (HCC) --No wheezing on examination and patient declines cough. --Continue home bronchodilators --Patient was noted to have room air pulse oximetry of 85% and initially required oxygen supplementation at 2 L --no respiratory distress--will wean off oxygen   Acute gastroenteritis--resolved Stool studies are negative Supportive care   Hematemesis (resolved) --Patient states her vomit was initially normal but then turned bright red after numerous episodes.  Most consistent with Mallory-Weiss tear.   --No additional episodes since arrival to the ED.  - cont po ppi  Severe recurrent major depression with psychotic features ([place]) --Per chart review, patient was previously treated with olanzapine, but is not currently taking any longer. --Continue home BuSpar and trazodone --Outpatient follow-up with PCP   Hypothyroidism - Continue home levothyroxine   Essential hypertension - Resume Toprol-XL 25 mg   Lupus (HCC) Not  currently on any DMARDs or immunosuppressants   Morbid obesity (BMI 41) Complicates overall prognosis and care  Abnormal CT scan finding Patient noted to have a rounded enhancing lesion within the vaginal cuff/cervix, with fluid in the vaginal canal, measuring 3.2 x 3.5 x 3.8 cm. This could represent cervicitis but recommend follow-up with gyn to exclude underlying neoplasm. Appreciate GYN input Pelvic ultrasound showed rounded soft tissue in the vaginal cuff corresponding to the density seen on the CT. Follow-up with GYN as an outpatient    Family communication :left  VM for granddter Consults :ID, GYN CODE STATUS: FULL DVT Prophylaxis :lovenox Level of care: Med-Surg Status is: Inpatient Remains inpatient appropriate because: will continue monitor for one more day and if she remains stable discharge on oral antibiotic tomorrow    TOTAL TIME TAKING CARE OF THIS PATIENT: 35 minutes.  >50% time spent on counselling and coordination of care  Note: This dictation was prepared with Dragon dictation along with smaller phrase technology. Any transcriptional errors that result from this process are unintentional.  Fritzi Mandes M.D    [facility]   CC: Primary care physician; Janna Arch, MD

## 2022-07-25 NOTE — Progress Notes (Signed)
Mobility Specialist - Progress Note   07/25/22 1514  Mobility  Activity Ambulated with assistance to bathroom  Level of Assistance Standby assist, set-up cues, supervision of patient - no hands on  Assistive Device Front wheel walker  Distance Ambulated (ft) 12 ft  Activity Response Tolerated well  $Mobility charge 1 Mobility   MS responding to call light. Pt supine upon entry, utilizing RA. Pt requesting to use the bathroom. Pt completed bed mob MinA for BLE. Pt STS to RW and amb SBA. Pt amb to the bathroom, left sitting on commode with instructions to use the call bell when done.   Candie Mile Mobility Specialist 07/25/22 3:19 PM

## 2022-07-25 NOTE — Progress Notes (Signed)
PT Cancellation Note  Patient Details Name: Shelly Freeman MRN: [id number] DOB: [birth year]   Cancelled Treatment:    Reason Eval/Treat Not Completed: Patient declined, no reason specified Spanish interpretor [name] assisting t/o eval attempt.  On arrival pt laying in bed c/o headache and dizziness.  She was willing to answer some questions but was completely unwilling to try doing any mobility/ambulation at this point due to her dizziness.  She could not expound on dizziness apart from saying that it was too much for her to even think about getting up/moving right now.  Despite gentle encouragement pt flatly refused, open to trying tomorrow if her symptoms are improved.    Kreg Shropshire, DPT 07/25/2022, 3:09 PM

## 2022-07-25 NOTE — Progress Notes (Signed)
Date of Admission:  07/20/2022   Total days of antibiotics ***        Day ***        Day ***        Day ***   ID: Shelly Freeman is a 72 y.o. female with  *** Principal Problem:   Sepsis ([place]) Active Problems:   Acute respiratory failure with hypoxia (HCC)   Hypothyroidism   Essential hypertension   Severe recurrent major depression with psychotic features (HCC)   Lupus (HCC)   AKI (acute kidney injury) ([place])   COPD (chronic obstructive pulmonary disease) (HCC)   Vomiting and diarrhea   Acute pyelonephritis   Hematemesis    Subjective: ***  Medications:   busPIRone  15 mg Oral BID   enoxaparin (LOVENOX) injection  0.5 mg/kg Subcutaneous Q24H   escitalopram  20 mg Oral Daily   folic acid  1 mg Oral Daily   gabapentin  300 mg Oral TID   levothyroxine  75 mcg Oral Q0600   loratadine  10 mg Oral Daily   metoprolol succinate  25 mg Oral Daily   oxybutynin  5 mg Oral QHS   pantoprazole  40 mg Oral Daily   QUEtiapine  50 mg Oral QHS   sodium chloride flush  3 mL Intravenous Q12H    Objective: Vital signs in last 24 hours: Patient Vitals for the past 24 hrs:  BP Temp Temp src Pulse Resp SpO2  07/25/22 0810 (!) 118/59 99.1 F (37.3 C) Oral 73 16 98 %  07/25/22 0444 (!) 143/58 98.5 F (36.9 C) -- 62 16 98 %  07/24/22 1953 120/66 98.3 F (36.8 C) Oral 74 16 99 %  07/24/22 1555 120/65 98.2 F (36.8 C) Oral 76 16 99 %     LDA Foley Central lines Other catheters  PHYSICAL EXAM:  General: Alert, cooperative, no distress, appears stated age.  Head: Normocephalic, without obvious abnormality, atraumatic. Eyes: Conjunctivae clear, anicteric sclerae. Pupils are equal ENT Nares normal. No drainage or sinus tenderness. Lips, mucosa, and tongue normal. No Thrush Neck: Supple, symmetrical, no adenopathy, thyroid: non tender no carotid bruit and no JVD. Back: No CVA tenderness. Lungs: Clear to auscultation bilaterally. No Wheezing or Rhonchi. No rales. Heart:  Regular rate and rhythm, no murmur, rub or gallop. Abdomen: Soft, non-tender,not distended. Bowel sounds normal. No masses Extremities: atraumatic, no cyanosis. No edema. No clubbing Skin: No rashes or lesions. Or bruising Lymph: Cervical, supraclavicular normal. Neurologic: Grossly non-focal  Lab Results    Latest Ref Rng & Units 07/24/2022    4:38 AM 07/22/2022    4:44 AM 07/21/2022    4:20 AM  CBC  WBC 4.0 - 10.5 K/uL 7.3  13.2  20.3   Hemoglobin 12.0 - 15.0 g/dL 10.0  10.0  10.9   Hematocrit 36.0 - 46.0 % 31.8  31.0  34.5   Platelets 150 - 400 K/uL 147  119  115        Latest Ref Rng & Units 07/24/2022    4:38 AM 07/22/2022    4:44 AM 07/21/2022    4:20 AM  CMP  Glucose 70 - 99 mg/dL 97  101  101   BUN 8 - 23 mg/dL 10  16  20    Creatinine 0.44 - 1.00 mg/dL 0.85  0.98  1.25   Sodium 135 - 145 mmol/L 139  135  134   Potassium 3.5 - 5.1 mmol/L 4.0  3.6  3.8   Chloride  98 - 111 mmol/L 108  109  103   CO2 22 - 32 mmol/L 26  20  21    Calcium 8.9 - 10.3 mg/dL 8.6  7.5  7.8       Microbiology:  Studies/Results: No results found.   Assessment/Plan: Acute pyelonephritis on the right side with sepsis presentation. ESBL E. coli in urine culture Initially started on ceftriaxone and then switched to carbapenem 48 hours later.  So she is day 3 of appropriate antibiotic therapy.  She is feeling better.  Leukocytosis has resolved.  As blood culture is negative we will Treat her with minimum of 7 days of antibiotic therapy.  Bactrim susceptible to the ESBL E. coli so that is an option on discharge. The CT abdomen and pelvis also showed a collection at the vaginal cuff and possible in the cervix. Recommend GYN consult for pelvic examination/vaginal ultrasound. Will also check a bladder scan for incomplete emptying.   AKI has resolved.   Anemia   Mild thrombocytopenia   Lupus currently not on any medication.  Followed by [facility]ologist at [facility].   History of left TKA    Anxiety/depression on multiple medication   Hypothyroidism on Synthroid.

## 2022-07-26 DIAGNOSIS — N12 Tubulo-interstitial nephritis, not specified as acute or chronic: Secondary | ICD-10-CM

## 2022-07-26 DIAGNOSIS — N179 Acute kidney failure, unspecified: Secondary | ICD-10-CM | POA: Diagnosis not present

## 2022-07-26 DIAGNOSIS — A498 Other bacterial infections of unspecified site: Secondary | ICD-10-CM

## 2022-07-26 DIAGNOSIS — Z1612 Extended spectrum beta lactamase (ESBL) resistance: Secondary | ICD-10-CM

## 2022-07-26 DIAGNOSIS — M329 Systemic lupus erythematosus, unspecified: Secondary | ICD-10-CM

## 2022-07-26 DIAGNOSIS — E039 Hypothyroidism, unspecified: Secondary | ICD-10-CM | POA: Diagnosis not present

## 2022-07-26 DIAGNOSIS — A419 Sepsis, unspecified organism: Secondary | ICD-10-CM | POA: Diagnosis not present

## 2022-07-26 MED ORDER — SULFAMETHOXAZOLE-TRIMETHOPRIM 800-160 MG PO TABS: 1.0000 | ORAL_TABLET | Freq: Two times a day (BID) | ORAL | Status: DC

## 2022-07-26 MED ORDER — SULFAMETHOXAZOLE-TRIMETHOPRIM 800-160 MG PO TABS: 1.0000 | ORAL_TABLET | Freq: Two times a day (BID) | ORAL | 0 refills | Status: AC

## 2022-07-26 NOTE — Evaluation (Signed)
Physical Therapy Evaluation Patient Details Name: Shelly Freeman MRN: [id number] DOB: 1951-03-02 Today's Date: 07/26/2022  History of Present Illness  Pt is a 72 y/o F admitted on 07/20/22 after presenting to the ED with c/o vomiting & diarrhea. Pt is being treated for sepsis 2/2 acute pyelonephritis, AKI, & acute respiratory failure with hypoxia. The CT abdomen and pelvis also showed a collection at the vaginal cuff and possible in the cervix. PMH: recurrent UTI, SLE, collagen vascular disease, hypothyroidism, depression/anxiety, COPD, sleep apnea  97%, 71 bpm, 137/90 (103), dizzy but no nystagmus noted  Clinical Impression  Pt seen for PT evaluation with pt agreeable. In person interpreter Estill Bamberg B) present for session. Pt requires max encouragement for participation as she reports she feels "bad". [facility] reports [facility], but lives alone in an apartment with level entry, & uses RW & w/c for mobilization. On this date, pt is able to complete bed mobility with mod I with use of hospital bed features, STS with decreased awareness of safe hand placement, & ambulates to door & back with RW.  Pt requires max encouragement for all mobility throughout session, as well as to sit in the recliner at end of session. Spoke with MD who reports pt was evaluated by [facility] nurses earlier today & they report pt is at her baseline. Recommend HHPT f/u.   Recommendations for follow up therapy are one component of a multi-disciplinary discharge planning process, led by the attending physician.  Recommendations may be updated based on patient status, additional functional criteria and insurance authorization.  Follow Up Recommendations Home health PT      Assistance Recommended at Discharge Intermittent Supervision/Assistance  Patient can return home with the following  A little help with walking and/or transfers;A little help with bathing/dressing/bathroom;Assistance with cooking/housework;Assist for  transportation;Help with stairs or ramp for entrance    Equipment Recommendations None recommended by PT  Recommendations for Other Services       Functional Status Assessment Patient has had a recent decline in their functional status and demonstrates the ability to make significant improvements in function in a reasonable and predictable amount of time.     Precautions / Restrictions Precautions Precautions: Fall Restrictions Weight Bearing Restrictions: No      Mobility  Bed Mobility Overal bed mobility: Modified Independent Bed Mobility: Supine to Sit     Supine to sit: Modified independent (Device/Increase time), HOB elevated     General bed mobility comments: extra time, use of bed rails, HOB elevated    Transfers Overall transfer level: Needs assistance Equipment used: Rolling walker (2 wheels) Transfers: Sit to/from Stand Sit to Stand: Supervision           General transfer comment: Pt able to transfer STS from EOB with supervision, decreased awareness of safe hand placement, as pt transfers STS with BUE on RW    Ambulation/Gait Ambulation/Gait assistance: Supervision Gait Distance (Feet): 25 Feet Assistive device: Rolling walker (2 wheels) Gait Pattern/deviations: Decreased step length - right, Decreased step length - left, Decreased stride length Gait velocity: decreased        Stairs            Wheelchair Mobility    Modified Rankin (Stroke Patients Only)       Balance Overall balance assessment: Needs assistance Sitting-balance support: Feet supported Sitting balance-Leahy Scale: Good Sitting balance - Comments: pt able to don socks sitting EOB without LOB   Standing balance support: Bilateral upper extremity supported, During  functional activity Standing balance-Leahy Scale: Fair                               Pertinent Vitals/Pain Pain Assessment Pain Assessment: Faces Faces Pain Scale: Hurts a little bit Pain  Location: generalized Pain Descriptors / Indicators: Discomfort Pain Intervention(s): Monitored during session    Home Living Family/patient expects to be discharged to:: Private residence Living Arrangements: Alone   Type of Home: Apartment Home Access: Level entry       Home Layout: One level Home Equipment: Conservation officer, nature (2 wheels);Wheelchair - manual      Prior Function               Mobility Comments: Pt reports she's ambulatory with RW, living alone but [facility] during the day. Also uses a w/c.       Hand Dominance        Extremity/Trunk Assessment   Upper Extremity Assessment Upper Extremity Assessment: Generalized weakness    Lower Extremity Assessment Lower Extremity Assessment: Generalized weakness       Communication   Communication: Interpreter utilized Civil engineer, contracting used Estill Bamberg B.))  Cognition Arousal/Alertness: Awake/alert Behavior During Therapy: WFL for tasks assessed/performed Overall Cognitive Status: Within Functional Limits for tasks assessed                                 General Comments: requires max encouragement for participation        General Comments General comments (skin integrity, edema, etc.): Pt c/o dizziness but no nystagmus noted. BP 137/90 mmHg MAP 103, HR 71 bpm, SpO2 97% on room air.    Exercises     Assessment/Plan    PT Assessment Patient needs continued PT services  PT Problem List Decreased strength;Decreased activity tolerance;Decreased balance;Decreased mobility;Decreased knowledge of use of DME;Decreased safety awareness       PT Treatment Interventions DME instruction;Therapeutic exercise;Gait training;Balance training;Stair training;Neuromuscular re-education;Functional mobility training;Patient/family education;Therapeutic activities;Modalities    PT Goals (Current goals can be found in the Care Plan section)  Acute Rehab PT Goals Patient Stated Goal: feel better PT  Goal Formulation: With patient Time For Goal Achievement: 08/09/22 Potential to Achieve Goals: Fair    Frequency Min 2X/week     Co-evaluation               AM-PAC PT "6 Clicks" Mobility  Outcome Measure Help needed turning from your back to your side while in a flat bed without using bedrails?: None Help needed moving from lying on your back to sitting on the side of a flat bed without using bedrails?: A Little Help needed moving to and from a bed to a chair (including a wheelchair)?: A Little Help needed standing up from a chair using your arms (e.g., wheelchair or bedside chair)?: A Little Help needed to walk in hospital room?: A Little Help needed climbing 3-5 steps with a railing? : A Little 6 Click Score: 19    End of Session   Activity Tolerance: Patient tolerated treatment well;Patient limited by fatigue Patient left: in chair;with call bell/phone within reach;with chair alarm set Nurse Communication: Mobility status PT Visit Diagnosis: Muscle weakness (generalized) (M62.81)    Time: [id number] PT Time Calculation (min) (ACUTE ONLY): 20 min   Charges:   PT Evaluation $PT Eval Low Complexity: [place]  Sabra Heck, PT, DPT 07/26/22, 12:59 PM   Waunita Schooner 07/26/2022, 12:56 PM

## 2022-07-26 NOTE — Discharge Summary (Signed)
Physician Discharge Summary   Patient: Shelly Freeman MRN: [id number] DOB: 08-22-1950  Admit date:     07/20/2022  Discharge date: 07/26/22  Discharge Physician: Shelly Freeman   PCP: Shelly Arch, MD   Recommendations at discharge:    F/u OB-GYN Dr Leafy Ro for ?cervicitis F/u  Dr Lutricia Feil at [facility] on your appt  Discharge Diagnoses: Principal Problem:   Sepsis [facility]) Active Problems:   Pyelonephritis   AKI (acute kidney injury) ([place])   Acute respiratory failure with hypoxia ([place])   Vomiting and diarrhea   Hematemesis   Severe recurrent major depression with psychotic features ([place])   COPD (chronic obstructive pulmonary disease) ([place])   Hypothyroidism   Essential hypertension   Lupus ([place])   Infection due to ESBL-producing Escherichia coli Shelly Freeman is a 72 y.o. female with medical history significant of recurrent UTI, SLE, collagen vascular disease, hypothyroidism, depression/anxiety, COPD, sleep apnea, who presents to the ED due to vomiting and diarrhea.   Sepsis ([place]) due to acute pyelonephritis --came in with tachycardia, marked leukocytosis, lactic acid of 4.4 which showed a downward trend associated with AKI and acute hypoxic respiratory failure (room air pulse oximetry was 85% requiring oxygen supplementation at 2 L) -- Blood pressure has improved and leukocytosis has normalized --She has been weaned off oxygen --CT abdomen demonstrated right-sided pyelonephritis.   --Urine culture yielded 80,000 CFU of ESBL E. Coli -- IV meropenem  # 5--change to po bactrim  per ID recommendation (total 7 days)   AKI (acute kidney injury) ([place]) --Secondary to ATN from volume depletion related to GI losses from vomiting/diarrhea and sepsis.  --No evidence of hydronephrosis on image --Baseline serum creatinine is 1.05 and on admission it was 1.35 --Renal function has improved with hydration and is back to normal   Acute respiratory failure with hypoxia (HCC) COPD (chronic  obstructive pulmonary disease) (HCC) --No wheezing on examination and patient declines cough. --Continue home bronchodilators --Patient was noted to have room air pulse oximetry of 85% and initially required oxygen supplementation at 2 L --no respiratory distress--will wean off oxygen   Acute gastroenteritis Stool studies are negative Supportive care Pt reports loose stools about 3 times a day --consume yoghurt   Hematemesis (resolved) --Patient states her vomit was initially normal but then turned bright red after numerous episodes.  Most consistent with Mallory-Weiss tear.   --No additional episodes since arrival to the ED.  - cont po ppi   Severe recurrent major depression with psychotic features ([place]) --Per chart review, patient was previously treated with olanzapine, but is not currently taking any longer. --Continue home BuSpar and trazodone --Outpatient follow-up with PCP   Hypothyroidism - Continue home levothyroxine   Essential hypertension - Resume Toprol-XL 25 mg   Lupus (HCC) Not currently on any DMARDs or immunosuppressants   Morbid obesity (BMI 41) Complicates overall prognosis and care   Abnormal CT scan finding Patient noted to have a rounded enhancing lesion within the vaginal cuff/cervix, with fluid in the vaginal canal, measuring 3.2 x 3.5 x 3.8 cm. This could represent cervicitis but recommend follow-up with gyn to exclude underlying neoplasm. Appreciate GYN input Pelvic ultrasound showed rounded soft tissue in the vaginal cuff corresponding to the density seen on the CT. Follow-up with GYN as an outpatient     Family communication :left VM for granddter Consults :ID, GYN CODE STATUS: FULL DVT Prophylaxis :lovenox  Overall improving--ambulating by self in the room D/w [facility] RN's/CM    Disposition: Home Diet recommendation:  Cardiac diet DISCHARGE MEDICATION: Allergies as of 07/26/2022       Reactions   Cholestyramine Other (See Comments)    severe        Medication List     STOP taking these medications    diphenhydrAMINE 25 MG tablet Commonly known as: BENADRYL   ibuprofen 400 MG tablet Commonly known as: ADVIL       TAKE these medications    acetaminophen 650 MG CR tablet Commonly known as: TYLENOL Take 650 mg by mouth 3 (three) times daily as needed for pain.   albuterol 108 (90 Base) MCG/ACT inhaler Commonly known as: VENTOLIN HFA Inhale into the lungs. Inhale 2 puffs into the lungs every 4 (four) hours as needed for wheezing or shortness of breath.   alendronate 70 MG tablet Commonly known as: FOSAMAX Take 70 mg by mouth once a week. Take with a full glass of water on an empty stomach.   busPIRone 15 MG tablet Commonly known as: BUSPAR Take 15 mg by mouth 2 (two) times daily.   calcium carbonate 750 MG chewable tablet Commonly known as: TUMS EX Chew 1 tablet by mouth 4 (four) times daily as needed for heartburn.   celecoxib 100 MG capsule Commonly known as: CELEBREX Take 1 capsule by mouth 2 (two) times daily.   cetirizine 10 MG tablet Commonly known as: ZYRTEC Take 10 mg by mouth daily.   cholecalciferol 25 MCG (1000 UT) tablet Generic drug: Cholecalciferol Take 1,000 Units by mouth daily.   cyanocobalamin 1000 MCG tablet Commonly known as: VITAMIN B12 Take 1,000 mcg by mouth daily.   diclofenac Sodium 1 % Gel Commonly known as: VOLTAREN Apply 2 g topically 4 (four) times daily.   escitalopram 20 MG tablet Commonly known as: LEXAPRO Take 20 mg by mouth daily.   estradiol 0.1 MG/GM vaginal cream Commonly known as: ESTRACE Place 0.5 g vaginally daily. Insert 1/2 gram into vagina every night as directed for two weeks then use twice weekly on Monday and Friday nights only   famotidine 20 MG tablet Commonly known as: PEPCID Take 20 mg by mouth 2 (two) times daily.   ferrous sulfate 325 (65 FE) MG tablet Take 325 mg by mouth every Monday, Wednesday, and Friday.   fluticasone 50  MCG/ACT nasal spray Commonly known as: FLONASE Place into both nostrils daily.   folic acid 1 MG tablet Commonly known as: FOLVITE Take 1 mg by mouth daily.   gabapentin 600 MG tablet Commonly known as: NEURONTIN Take 600 mg by mouth 3 (three) times daily.   hydrocortisone cream 1 % Apply 1 application topically 3 (three) times daily as needed for itching.   ipratropium-albuterol 0.5-2.5 (3) MG/3ML Soln Commonly known as: DUONEB Take 3 mLs by nebulization every 6 (six) hours as needed (shortness of breath).   levothyroxine 75 MCG tablet Commonly known as: SYNTHROID Take 75 mcg by mouth daily before breakfast.   lidocaine 5 % Commonly known as: LIDODERM Place 1 patch onto the skin daily. Remove & Discard patch within 12 hours or as directed by MD   liver oil-zinc oxide 40 % ointment Commonly known as: DESITIN Apply 1 Application topically as needed for irritation. What changed: Another medication with the same name was removed. Continue taking this medication, and follow the directions you see here.   Metamucil 0.36 g Caps Generic drug: Psyllium Take 0.4 g by mouth daily. Take 4 capsules by mouth daily   methenamine 1 g tablet Commonly known as: HIPREX  Take 1 g by mouth daily.   metoprolol succinate 25 MG 24 hr tablet Commonly known as: TOPROL-XL Take 25 mg by mouth daily.   nystatin powder Generic drug: nystatin Apply 1 Application topically 2 (two) times daily.   oxybutynin 5 MG 24 hr tablet Commonly known as: DITROPAN-XL Take 5 mg by mouth in the morning and at bedtime.   pantoprazole 20 MG tablet Commonly known as: PROTONIX Take 20 mg by mouth daily.   Pataday 0.2 % Soln Generic drug: Olopatadine HCl Apply to eye daily as needed.   QUEtiapine 50 MG tablet Commonly known as: SEROQUEL Take 50 mg by mouth at bedtime.   sulfamethoxazole-trimethoprim 800-160 MG tablet Commonly known as: BACTRIM DS Take 1 tablet by mouth every 12 (twelve) hours for 2  days. Start taking on: July 27, 2022   traMADol 50 MG tablet Commonly known as: Ultram Take 1 tablet (50 mg total) by mouth every 6 (six) hours as needed. What changed:  when to take this reasons to take this   traZODone 100 MG tablet Commonly known as: DESYREL Take 125 mg by mouth at bedtime.   traZODone 50 MG tablet Commonly known as: DESYREL Take 25 mg by mouth at bedtime as needed for sleep.        Follow-up Information     Benjaman Kindler, MD Follow up.   Specialty: Obstetrics and Gynecology Why: Hopsital f/u  for cervicitis. The office will call you for your appointment. Contact information: [name] [address] [phone]         Shelly Arch, MD. Go on 07/27/2022.   Specialty: Internal Medicine Why: Go at 11:00am. Contact information: [place] [address] [phone]                 Filed Weights   07/21/22 0830 07/23/22 0504 07/24/22 0432  Weight: 99.4 kg 98.6 kg 98.6 kg     Condition at discharge: fair  The results of significant diagnostics from this hospitalization (including imaging, microbiology, ancillary and laboratory) are listed below for reference.   Imaging Studies: US PELVIS LIMITED (TRANSABDOMINAL ONLY)  Result Date: 07/23/2022 CLINICAL DATA:  Vaginal lesion seen on CT. EXAM: LIMITED ULTRASOUND OF PELVIS TECHNIQUE: Limited transabdominal ultrasound examination of the pelvis was performed. COMPARISON:  CT a of the abdomen pelvis dated 07/20/2022. FINDINGS: There is a 3.9 x 3.6 x 3.9 cm rounded soft tissue in the vaginal cuff. This is not evaluated on this ultrasound but may represent residual cervical or endometrial tissue. MRI may provide better characterization if clinically indicated. There is no drainable fluid collection. IMPRESSION: Rounded soft tissue in the vaginal cuff corresponding to the density seen on the CT. Electronically Signed   By: Anner Crete M.D.   On: 07/23/2022  20:46   CT ABDOMEN PELVIS W CONTRAST  Result Date: 07/20/2022 CLINICAL DATA:  Abdominal pain, acute, nonlocalized EXAM: CT ABDOMEN AND PELVIS WITH CONTRAST TECHNIQUE: Multidetector CT imaging of the abdomen and pelvis was performed using the standard protocol following bolus administration of intravenous contrast. RADIATION DOSE REDUCTION: This exam was performed according to the departmental dose-optimization program which includes automated exposure control, adjustment of the mA and/or kV according to patient size and/or use of iterative reconstruction technique. CONTRAST:  13mL OMNIPAQUE IOHEXOL 300 MG/ML  SOLN COMPARISON:  CT 05/14/2022. FINDINGS: Lower chest: Left basilar linear atelectasis/scarring. Hepatobiliary: No focal liver abnormality is seen. The gallbladder is unremarkable. Pancreas: Unremarkable. No pancreatic ductal dilatation or surrounding  inflammatory changes. Spleen: Normal in size without focal abnormality. Adrenals/Urinary Tract: Adrenal glands are unremarkable. There is asymmetric right bladder wall thickening with adjacent stranding. There is prominence of the right renal collecting system with urothelial enhancement. There is right-sided perinephric stranding. No obstructing stone identified. Stomach/Bowel: Small hiatal hernia. There is no evidence of bowel obstruction.No evidence of appendicitis. Vascular/Lymphatic: Scattered atherosclerosis. No AAA. No lymphadenopathy. Reproductive: Evidence of prior supracervical hysterectomy. There is a round enhancing lesion within the vaginal cuff/cervix, with fluid in the vaginal canal. This lesion measures 3.2 x 3.5 x 3.8 cm (series 2, image 88, series 7, image 65. This may have been present on prior exams but much less conspicuous. Other: Trace free fluid the pelvis. No bowel containing hernia. No free air. Musculoskeletal: No acute osseous abnormality. No suspicious osseous lesion. IMPRESSION: Asymmetric right bladder wall thickening with  adjacent stranding, prominence of the right renal collecting system, urothelial enhancement, and right perinephric stranding. Findings are concerning for ascending urinary tract infection. Recommend correlation with urinalysis. Evidence of prior supracervical hysterectomy. Rounded enhancing lesion within the vaginal cuff/cervix, with fluid in the vaginal canal, measuring 3.2 x 3.5 x 3.8 cm. This could represent cervicitis but recommend follow-up with gyn to exclude underlying neoplasm. Trace free fluid in the pelvis. Electronically Signed   By: Maurine Simmering M.D.   On: 07/20/2022 14:40   DG Chest Portable 1 View  Result Date: 07/20/2022 CLINICAL DATA:  Shortness of breath. EXAM: PORTABLE CHEST 1 VIEW COMPARISON:  05/17/2021 FINDINGS: Rotated film. The cardiopericardial silhouette is within normal limits for size. Old right rib fractures again noted Telemetry leads overlie the chest. IMPRESSION: No active disease. Electronically Signed   By: Misty Stanley M.D.   On: 07/20/2022 12:33    Microbiology: Results for orders placed or performed during the hospital encounter of 07/20/22  Resp panel by RT-PCR (RSV, Flu A&B, Covid) Anterior Nasal Swab     Status: None   Collection Time: 07/20/22 12:40 PM   Specimen: Anterior Nasal Swab  Result Value Ref Range Status   SARS Coronavirus 2 by RT PCR NEGATIVE NEGATIVE Final    Comment: (NOTE) SARS-CoV-2 target nucleic acids are NOT DETECTED.  The SARS-CoV-2 RNA is generally detectable in upper respiratory specimens during the acute phase of infection. The lowest concentration of SARS-CoV-2 viral copies this assay can detect is 138 copies/mL. A negative result does not preclude SARS-Cov-2 infection and should not be used as the sole basis for treatment or other patient management decisions. A negative result may occur with  improper specimen collection/handling, submission of specimen other than nasopharyngeal swab, presence of viral mutation(s) within  the areas targeted by this assay, and inadequate number of viral copies(<138 copies/mL). A negative result must be combined with clinical observations, patient history, and epidemiological information. The expected result is Negative.  Fact Sheet for Patients:  EntrepreneurPulse.com.au  Fact Sheet for Healthcare Providers:  IncredibleEmployment.be  This test is no t yet approved or cleared by the [place] FDA and  has been authorized for detection and/or diagnosis of SARS-CoV-2 by FDA under an Emergency Use Authorization (EUA). This EUA will remain  in effect (meaning this test can be used) for the duration of the COVID-19 declaration under Section 564(b)(1) of the Act, 21 U.S.C.section 360bbb-3(b)(1), unless the authorization is terminated  or revoked sooner.       Influenza A by PCR NEGATIVE NEGATIVE Final   Influenza B by PCR NEGATIVE NEGATIVE Final    Comment: (NOTE) The  Xpert Xpress SARS-CoV-2/FLU/RSV plus assay is intended as an aid in the diagnosis of influenza from Nasopharyngeal swab specimens and should not be used as a sole basis for treatment. Nasal washings and aspirates are unacceptable for Xpert Xpress SARS-CoV-2/FLU/RSV testing.  Fact Sheet for Patients: EntrepreneurPulse.com.au  Fact Sheet for Healthcare Providers: IncredibleEmployment.be  This test is not yet approved or cleared by the [place] FDA and has been authorized for detection and/or diagnosis of SARS-CoV-2 by FDA under an Emergency Use Authorization (EUA). This EUA will remain in effect (meaning this test can be used) for the duration of the COVID-19 declaration under Section 564(b)(1) of the Act, 21 U.S.C. section 360bbb-3(b)(1), unless the authorization is terminated or revoked.     Resp Syncytial Virus by PCR NEGATIVE NEGATIVE Final    Comment: (NOTE) Fact Sheet for  Patients: EntrepreneurPulse.com.au  Fact Sheet for Healthcare Providers: IncredibleEmployment.be  This test is not yet approved or cleared by the [place] FDA and has been authorized for detection and/or diagnosis of SARS-CoV-2 by FDA under an Emergency Use Authorization (EUA). This EUA will remain in effect (meaning this test can be used) for the duration of the COVID-19 declaration under Section 564(b)(1) of the Act, 21 U.S.C. section 360bbb-3(b)(1), unless the authorization is terminated or revoked.  Performed at [facility], [address] [place], [place] [address]   Blood culture (routine x 2)     Status: None   Collection Time: 07/20/22 12:40 PM   Specimen: BLOOD  Result Value Ref Range Status   Specimen Description   Final    BLOOD BLOOD RIGHT FOREARM Performed at [facility], [address] [place], [place] [address]    Special Requests   Final    BOTTLES DRAWN AEROBIC AND ANAEROBIC Blood Culture adequate volume Performed at [facility], [address] [place], [place] [address]    Culture   Final    NO GROWTH 5 DAYS Performed at [facility], [place] [address] [place], [place] [address]    Report Status 07/25/2022 FINAL  Final  Blood culture (routine x 2)     Status: None   Collection Time: 07/20/22 12:40 PM   Specimen: BLOOD  Result Value Ref Range Status   Specimen Description   Final    BLOOD LEFT ANTECUBITAL Performed at [facility], [address] [place], [place] [address]    Special Requests   Final    BOTTLES DRAWN AEROBIC AND ANAEROBIC Blood Culture adequate volume Performed at [facility], [address], [place], [place] [address]    Culture   Final    NO GROWTH 5 DAYS Performed at [facility], [place] [address], [place], [place] [address]    Report Status 07/25/2022 FINAL  Final  Urine Culture (for pregnant, neutropenic or urologic  patients or patients with an indwelling urinary catheter)     Status: Abnormal   Collection Time: 07/20/22  2:19 PM   Specimen: Urine, Clean Catch  Result Value Ref Range Status   Specimen Description   Final    URINE, CLEAN CATCH Performed at [facility], [address] [place], [place] [address]    Special Requests   Final    NONE Performed at [facility], [address] [place],  [address]    Culture (A)  Final    80,000 COLONIES/mL ESCHERICHIA COLI Confirmed Extended Spectrum Beta-Lactamase Producer (ESBL).  In bloodstream infections from ESBL organisms, carbapenems are  preferred over piperacillin/tazobactam. They are shown to have a lower risk of mortality.    Report Status 07/22/2022 FINAL  Final   Organism ID, Bacteria ESCHERICHIA COLI (A)  Final      Susceptibility   Escherichia coli - MIC*    AMPICILLIN >=32 RESISTANT Resistant     CEFAZOLIN >=64 RESISTANT Resistant     CEFEPIME >=32 RESISTANT Resistant     CEFTRIAXONE >=64 RESISTANT Resistant     CIPROFLOXACIN >=4 RESISTANT Resistant     GENTAMICIN <=1 SENSITIVE Sensitive     IMIPENEM <=0.25 SENSITIVE Sensitive     NITROFURANTOIN <=16 SENSITIVE Sensitive     TRIMETH/SULFA <=20 SENSITIVE Sensitive     AMPICILLIN/SULBACTAM >=32 RESISTANT Resistant     PIP/TAZO 16 SENSITIVE Sensitive     * 80,000 COLONIES/mL ESCHERICHIA COLI  Gastrointestinal Panel by PCR , Stool     Status: None   Collection Time: 07/21/22 10:45 AM   Specimen: Stool  Result Value Ref Range Status   Campylobacter species NOT DETECTED NOT DETECTED Final   Plesimonas shigelloides NOT DETECTED NOT DETECTED Final   Salmonella species NOT DETECTED NOT DETECTED Final   Yersinia enterocolitica NOT DETECTED NOT DETECTED Final   Vibrio species NOT DETECTED NOT DETECTED Final   Vibrio cholerae NOT DETECTED NOT DETECTED Final   Enteroaggregative E coli (EAEC) NOT DETECTED NOT DETECTED Final   Enteropathogenic E coli (EPEC) NOT  DETECTED NOT DETECTED Final   Enterotoxigenic E coli (ETEC) NOT DETECTED NOT DETECTED Final   Shiga like toxin producing E coli (STEC) NOT DETECTED NOT DETECTED Final   Shigella/Enteroinvasive E coli (EIEC) NOT DETECTED NOT DETECTED Final   Cryptosporidium NOT DETECTED NOT DETECTED Final   Cyclospora cayetanensis NOT DETECTED NOT DETECTED Final   Entamoeba histolytica NOT DETECTED NOT DETECTED Final   Giardia lamblia NOT DETECTED NOT DETECTED Final   Adenovirus F40/41 NOT DETECTED NOT DETECTED Final   Astrovirus NOT DETECTED NOT DETECTED Final   Norovirus GI/GII NOT DETECTED NOT DETECTED Final   Rotavirus A NOT DETECTED NOT DETECTED Final   Sapovirus (I, II, IV, and V) NOT DETECTED NOT DETECTED Final    Comment: Performed at [facility], [address] [place], [place] [address]    Labs: CBC: Recent Labs  Lab 07/20/22 1240 07/20/22 2059 07/21/22 0420 07/22/22 0444 07/24/22 0438  WBC 21.4* 18.9* 20.3* 13.2* 7.3  NEUTROABS  --   --   --   --  5.3  HGB 11.6* 11.5* 10.9* 10.0* 10.0*  HCT 37.1 37.1 34.5* 31.0* 31.8*  MCV 103.3* 102.8* 102.7* 101.0* 100.6*  PLT 169 131* 115* 119* [id number]*   Basic Metabolic Panel: Recent Labs  Lab 07/20/22 1240 07/21/22 0420 07/22/22 0444 07/24/22 0438  NA 133* 134* 135 139  K 3.9 3.8 3.6 4.0  CL 99 103 109 108  CO2 20* 21* 20* 26  GLUCOSE 154* 101* 101* 97  BUN 24* 20 16 10   CREATININE 1.35* 1.25* 0.98 0.85  CALCIUM 8.5* 7.8* 7.5* 8.6*   Liver Function Tests: Recent Labs  Lab 07/20/22 1240  AST 35  ALT 17  ALKPHOS 76  BILITOT 1.0  PROT 6.5  ALBUMIN 2.9*    Discharge time spent: greater than 30 minutes.  Signed: Fritzi Mandes, MD [facility] 07/26/2022

## 2022-07-26 NOTE — Progress Notes (Signed)
Patient AVS discussed and spanish Interp utilized. Patient provided AVS in her primary language and in [place]. Copy also provided to [facility] driver for facility. Patient acknowledge full understanding of medications and appointments. Patient stated that she did not want to go to [facility] but she did want to go home. Patient's personal belonging packed up and given to her along with a Yellow envelope that was in her chart.  [facility] transport has arrived and patient wheeled down to the receiving driver for [facility].Patient Discharged.

## 2022-07-26 NOTE — TOC Transition Note (Signed)
Transition of Care [facility]) - CM/SW Discharge Note   Patient Details  Name: Shelly Freeman MRN: [id number] Date of Birth: 25-Sep-1950  [facility]) CM/SW Contact:  Beverly Sessions, RN Phone Number: 07/26/2022, 10:53 AM   Clinical Narrative:    Patient to discharge today MD has notified family Israel and Zigmund Daniel with [facility] to bedside today to see patient They will arrange transport for discharge today [facility] request AVS and dc summary to be in DC packet.  Bedside RN notified         Patient Goals and CMS Choice      Discharge Placement                         Discharge Plan and Services Additional resources added to the After Visit Summary for                                       Social Determinants of Health (SDOH) Interventions SDOH Screenings   Food Insecurity: Patient Unable To Answer (07/20/2022)  Housing: Low Risk  (07/20/2022)  Transportation Needs: Patient Unable To Answer (07/20/2022)  Utilities: Patient Unable To Answer (07/20/2022)  Alcohol Screen: Low Risk  (12/09/2017)  Tobacco Use: Low Risk  (07/20/2022)     Readmission Risk Interventions    01/28/2021   12:19 PM  Readmission Risk Prevention Plan  Transportation Screening Complete  PCP or Specialist Appt within 3-5 Days Complete  HRI or [place] Complete  Social Work Consult for [place] Planning/Counseling Complete  Palliative Care Screening Not Applicable  Medication Review Press photographer) Complete

## 2022-07-26 NOTE — Progress Notes (Signed)
Mobility Specialist - Progress Note   07/26/22 0900  Mobility  Activity Ambulated with assistance in room;Ambulated with assistance to bathroom  Level of Assistance Standby assist, set-up cues, supervision of patient - no hands on  Assistive Device Front wheel walker  Distance Ambulated (ft) 15 ft  Activity Response Tolerated well  $Mobility charge 1 Mobility     Pt using restroom upon arrival, utilizing RA. Pt performed seated peri-care independently. STS and ambulation to sink for ADLs with supervision. No LOB. Good standing balance. SOB with activity, however O2 sats in mid-high 90s. Pt ambulated back to bed with vc to keep RW on ground and cues for navigating RW in tight spaces. Pt returned supine modI and able to reposition to [facility] with trendelenburg feature. Reports pain in abdominal area. Pt left in bed with alarm set, needs in reach. Assisted with breakfast tray set-up prior to exit. RN at bedside.    Kathee Delton Mobility Specialist 07/26/22, 10:01 AM

## 2022-09-13 ENCOUNTER — Inpatient Hospital Stay
Admission: EM | Admit: 2022-09-13 | Discharge: 2022-09-18 | DRG: 690 | Disposition: A | Discharge status: Home Health | Payer: Medicare (Managed Care) | Attending: Internal Medicine | Admitting: Internal Medicine

## 2022-09-13 ENCOUNTER — Encounter: Payer: Self-pay | Admitting: Emergency Medicine

## 2022-09-13 ENCOUNTER — Emergency Department: Payer: Medicare (Managed Care)

## 2022-09-13 ENCOUNTER — Other Ambulatory Visit: Payer: Self-pay

## 2022-09-13 DIAGNOSIS — Z9884 Bariatric surgery status: Secondary | ICD-10-CM

## 2022-09-13 DIAGNOSIS — I10 Essential (primary) hypertension: Secondary | ICD-10-CM | POA: Diagnosis present

## 2022-09-13 DIAGNOSIS — K219 Gastro-esophageal reflux disease without esophagitis: Secondary | ICD-10-CM | POA: Diagnosis present

## 2022-09-13 DIAGNOSIS — N12 Tubulo-interstitial nephritis, not specified as acute or chronic: Secondary | ICD-10-CM | POA: Diagnosis present

## 2022-09-13 DIAGNOSIS — J4489 Other specified chronic obstructive pulmonary disease: Secondary | ICD-10-CM | POA: Diagnosis present

## 2022-09-13 DIAGNOSIS — F418 Other specified anxiety disorders: Secondary | ICD-10-CM | POA: Diagnosis present

## 2022-09-13 DIAGNOSIS — I272 Pulmonary hypertension, unspecified: Secondary | ICD-10-CM | POA: Diagnosis present

## 2022-09-13 DIAGNOSIS — Z8619 Personal history of other infectious and parasitic diseases: Secondary | ICD-10-CM | POA: Diagnosis not present

## 2022-09-13 DIAGNOSIS — Z79890 Hormone replacement therapy (postmenopausal): Secondary | ICD-10-CM | POA: Diagnosis not present

## 2022-09-13 DIAGNOSIS — Z96652 Presence of left artificial knee joint: Secondary | ICD-10-CM | POA: Diagnosis present

## 2022-09-13 DIAGNOSIS — Z9841 Cataract extraction status, right eye: Secondary | ICD-10-CM | POA: Diagnosis not present

## 2022-09-13 DIAGNOSIS — R197 Diarrhea, unspecified: Secondary | ICD-10-CM | POA: Diagnosis not present

## 2022-09-13 DIAGNOSIS — Z9842 Cataract extraction status, left eye: Secondary | ICD-10-CM

## 2022-09-13 DIAGNOSIS — F32A Depression, unspecified: Secondary | ICD-10-CM | POA: Diagnosis present

## 2022-09-13 DIAGNOSIS — N390 Urinary tract infection, site not specified: Secondary | ICD-10-CM | POA: Diagnosis not present

## 2022-09-13 DIAGNOSIS — B9620 Unspecified Escherichia coli [E. coli] as the cause of diseases classified elsewhere: Secondary | ICD-10-CM | POA: Diagnosis not present

## 2022-09-13 DIAGNOSIS — G894 Chronic pain syndrome: Secondary | ICD-10-CM | POA: Diagnosis present

## 2022-09-13 DIAGNOSIS — A498 Other bacterial infections of unspecified site: Secondary | ICD-10-CM

## 2022-09-13 DIAGNOSIS — N10 Acute tubulo-interstitial nephritis: Secondary | ICD-10-CM | POA: Diagnosis not present

## 2022-09-13 DIAGNOSIS — Z803 Family history of malignant neoplasm of breast: Secondary | ICD-10-CM

## 2022-09-13 DIAGNOSIS — R079 Chest pain, unspecified: Principal | ICD-10-CM

## 2022-09-13 DIAGNOSIS — M150 Primary generalized (osteo)arthritis: Secondary | ICD-10-CM | POA: Diagnosis present

## 2022-09-13 DIAGNOSIS — E039 Hypothyroidism, unspecified: Secondary | ICD-10-CM | POA: Diagnosis present

## 2022-09-13 DIAGNOSIS — Z87442 Personal history of urinary calculi: Secondary | ICD-10-CM | POA: Diagnosis not present

## 2022-09-13 DIAGNOSIS — Z6841 Body Mass Index (BMI) 40.0 and over, adult: Secondary | ICD-10-CM

## 2022-09-13 DIAGNOSIS — M159 Polyosteoarthritis, unspecified: Secondary | ICD-10-CM | POA: Diagnosis present

## 2022-09-13 DIAGNOSIS — N136 Pyonephrosis: Principal | ICD-10-CM | POA: Diagnosis present

## 2022-09-13 DIAGNOSIS — R6520 Severe sepsis without septic shock: Secondary | ICD-10-CM

## 2022-09-13 DIAGNOSIS — N3090 Cystitis, unspecified without hematuria: Secondary | ICD-10-CM

## 2022-09-13 DIAGNOSIS — J449 Chronic obstructive pulmonary disease, unspecified: Secondary | ICD-10-CM | POA: Diagnosis not present

## 2022-09-13 DIAGNOSIS — E559 Vitamin D deficiency, unspecified: Secondary | ICD-10-CM | POA: Diagnosis present

## 2022-09-13 DIAGNOSIS — M329 Systemic lupus erythematosus, unspecified: Secondary | ICD-10-CM | POA: Diagnosis present

## 2022-09-13 DIAGNOSIS — Z888 Allergy status to other drugs, medicaments and biological substances status: Secondary | ICD-10-CM | POA: Diagnosis not present

## 2022-09-13 DIAGNOSIS — G4730 Sleep apnea, unspecified: Secondary | ICD-10-CM | POA: Diagnosis present

## 2022-09-13 DIAGNOSIS — Z87440 Personal history of urinary (tract) infections: Secondary | ICD-10-CM

## 2022-09-13 DIAGNOSIS — F419 Anxiety disorder, unspecified: Secondary | ICD-10-CM | POA: Diagnosis present

## 2022-09-13 DIAGNOSIS — Z7983 Long term (current) use of bisphosphonates: Secondary | ICD-10-CM

## 2022-09-13 DIAGNOSIS — Z79899 Other long term (current) drug therapy: Secondary | ICD-10-CM

## 2022-09-13 DIAGNOSIS — Z961 Presence of intraocular lens: Secondary | ICD-10-CM | POA: Diagnosis present

## 2022-09-13 LAB — LACTIC ACID, PLASMA
Lactic Acid, Venous: 1.2 mmol/L (ref 0.5–1.9)
Lactic Acid, Venous: 1 mmol/L (ref 0.5–1.9)

## 2022-09-13 LAB — COMPREHENSIVE METABOLIC PANEL
ALT: 12 U/L (ref 0–44)
AST: 20 U/L (ref 15–41)
Albumin: 3.2 g/dL — ABNORMAL LOW (ref 3.5–5.0)
Alkaline Phosphatase: 89 U/L (ref 38–126)
Anion gap: 10 (ref 5–15)
BUN: 17 mg/dL (ref 8–23)
CO2: 19 mmol/L — ABNORMAL LOW (ref 22–32)
Calcium: 8.7 mg/dL — ABNORMAL LOW (ref 8.9–10.3)
Chloride: 103 mmol/L (ref 98–111)
Creatinine, Ser: 0.99 mg/dL (ref 0.44–1.00)
GFR, Estimated: >60 mL/min (ref >60)
Glucose, Bld: 117 mg/dL — ABNORMAL HIGH (ref 70–99)
Potassium: 4 mmol/L (ref 3.5–5.1)
Sodium: 132 mmol/L — ABNORMAL LOW (ref 135–145)
Total Bilirubin: 0.7 mg/dL (ref 0.3–1.2)
Total Protein: 6.3 g/dL — ABNORMAL LOW (ref 6.5–8.1)

## 2022-09-13 LAB — URINALYSIS, COMPLETE (UACMP) WITH MICROSCOPIC
Bilirubin Urine: NEGATIVE
Glucose, UA: NEGATIVE mg/dL
Ketones, ur: NEGATIVE mg/dL
Nitrite: NEGATIVE
Protein, ur: NEGATIVE mg/dL
Specific Gravity, Urine: 1.003 — ABNORMAL LOW (ref 1.005–1.030)
WBC, UA: >50 WBC/hpf (ref 0–5)
pH: 8 (ref 5.0–8.0)

## 2022-09-13 LAB — TROPONIN I (HIGH SENSITIVITY)
Troponin I (High Sensitivity): 8 ng/L (ref <18)
Troponin I (High Sensitivity): 6 ng/L (ref <18)

## 2022-09-13 LAB — CBC
HCT: 38.2 % (ref 36.0–46.0)
Hemoglobin: 12.3 g/dL (ref 12.0–15.0)
MCH: 30.8 pg (ref 26.0–34.0)
MCHC: 32.2 g/dL (ref 30.0–36.0)
MCV: 95.5 fL (ref 80.0–100.0)
Platelets: 166 10*3/uL (ref 150–400)
RBC: 4 MIL/uL (ref 3.87–5.11)
RDW: 13.2 % (ref 11.5–15.5)
WBC: 14 10*3/uL — ABNORMAL HIGH (ref 4.0–10.5)
nRBC: 0 % (ref 0.0–0.2)

## 2022-09-13 MED ORDER — GABAPENTIN 300 MG PO CAPS
600.0000 mg | ORAL_CAPSULE | Freq: Three times a day (TID) | ORAL | Status: DC
  Administered 2022-09-13 – 2022-09-17 (×15): 600 mg via ORAL
  Filled 2022-09-13 (×15): qty 2

## 2022-09-13 MED ORDER — ACETAMINOPHEN 325 MG PO TABS
650.0000 mg | ORAL_TABLET | Freq: Four times a day (QID) | ORAL | Status: DC | PRN
  Administered 2022-09-14 – 2022-09-17 (×5): 650 mg via ORAL
  Filled 2022-09-13 (×5): qty 2

## 2022-09-13 MED ORDER — TRAMADOL HCL 50 MG PO TABS
50.0000 mg | ORAL_TABLET | Freq: Three times a day (TID) | ORAL | Status: DC | PRN
  Administered 2022-09-13 – 2022-09-17 (×10): 50 mg via ORAL
  Filled 2022-09-13 (×11): qty 1

## 2022-09-13 MED ORDER — PANTOPRAZOLE SODIUM 20 MG PO TBEC
20.0000 mg | DELAYED_RELEASE_TABLET | Freq: Every day | ORAL | Status: DC
  Administered 2022-09-13 – 2022-09-17 (×5): 20 mg via ORAL
  Filled 2022-09-13 (×5): qty 1

## 2022-09-13 MED ORDER — SODIUM CHLORIDE 0.9 % IV BOLUS
1000.0000 mL | Freq: Once | INTRAVENOUS | Status: AC
  Administered 2022-09-13: 1000 mL via INTRAVENOUS

## 2022-09-13 MED ORDER — ONDANSETRON HCL 4 MG/2ML IJ SOLN
4.0000 mg | Freq: Once | INTRAMUSCULAR | Status: AC
  Administered 2022-09-13: 4 mg via INTRAVENOUS
  Filled 2022-09-13: qty 2

## 2022-09-13 MED ORDER — METOPROLOL SUCCINATE ER 25 MG PO TB24
25.0000 mg | ORAL_TABLET | Freq: Every day | ORAL | Status: DC
  Administered 2022-09-13 – 2022-09-17 (×5): 25 mg via ORAL
  Filled 2022-09-13 (×5): qty 1

## 2022-09-13 MED ORDER — IOHEXOL 350 MG/ML SOLN
100.0000 mL | Freq: Once | INTRAVENOUS | Status: AC | PRN
  Administered 2022-09-13: 100 mL via INTRAVENOUS

## 2022-09-13 MED ORDER — LEVOTHYROXINE SODIUM 50 MCG PO TABS
75.0000 ug | ORAL_TABLET | Freq: Every day | ORAL | Status: DC
  Administered 2022-09-14 – 2022-09-18 (×5): 75 ug via ORAL
  Filled 2022-09-13 (×5): qty 2

## 2022-09-13 MED ORDER — BUSPIRONE HCL 10 MG PO TABS
15.0000 mg | ORAL_TABLET | Freq: Two times a day (BID) | ORAL | Status: DC
  Administered 2022-09-13 – 2022-09-17 (×10): 15 mg via ORAL
  Filled 2022-09-13 (×7): qty 2
  Filled 2022-09-13: qty 3
  Filled 2022-09-13 (×2): qty 2

## 2022-09-13 MED ORDER — SODIUM CHLORIDE 0.9 % IV SOLN
2.0000 g | Freq: Once | INTRAVENOUS | Status: DC
  Administered 2022-09-13: 2 g via INTRAVENOUS
  Filled 2022-09-13: qty 20

## 2022-09-13 MED ORDER — TRAZODONE HCL 50 MG PO TABS
25.0000 mg | ORAL_TABLET | Freq: Every evening | ORAL | Status: DC | PRN
  Administered 2022-09-14 – 2022-09-17 (×3): 25 mg via ORAL
  Filled 2022-09-13 (×3): qty 1

## 2022-09-13 MED ORDER — PIPERACILLIN-TAZOBACTAM 3.375 G IVPB 30 MIN
3.3750 g | Freq: Once | INTRAVENOUS | Status: AC
  Administered 2022-09-13: 3.375 g via INTRAVENOUS
  Filled 2022-09-13: qty 50

## 2022-09-13 MED ORDER — MIRABEGRON ER 25 MG PO TB24
25.0000 mg | ORAL_TABLET | Freq: Every day | ORAL | Status: DC
  Administered 2022-09-13 – 2022-09-17 (×5): 25 mg via ORAL
  Filled 2022-09-13 (×7): qty 1

## 2022-09-13 MED ORDER — LORAZEPAM 2 MG/ML IJ SOLN
1.0000 mg | Freq: Once | INTRAMUSCULAR | Status: AC
  Administered 2022-09-13: 1 mg via INTRAVENOUS
  Filled 2022-09-13: qty 1

## 2022-09-13 NOTE — Consult Note (Signed)
Pharmacy Antibiotic Note  Shelly Freeman is a 72 y.o. female admitted on 09/13/2022 with pyelonephritis.  Pharmacy has been consulted for meropenem dosing.  Urine culture from 07/20/22: ESBL E. Coli (S= meropenem, Zosyn, SMX/TMP, Macrobid, gent)  Zosyn 3.375 g IV x 1 given 5/9 @ 1211  Plan: Start meropenem 1 gram IV every 8 hours Follow renal function and cultures for adjustments  Height: 5\' 1"  (154.9 cm) Weight: 99.8 kg (220 lb 1.6 oz) IBW/kg (Calculated) : 47.8  Temp (24hrs), Avg:98.1 F (36.7 C), Min:98.1 F (36.7 C), Max:98.1 F (36.7 C)  Recent Labs  Lab 09/13/22 0523  WBC 14.0*  CREATININE 0.99    Estimated Creatinine Clearance: 57.3 mL/min (by C-G formula based on SCr of 0.99 mg/dL).    Allergies  Allergen Reactions   Cholestyramine Other (See Comments)    severe    Antimicrobials this admission: Meropenem 5/9 >>  Ceftriaxone x 1 5/9 Zosyn x 1 5/9  Dose adjustments this admission: N/A  Microbiology results: 5/9 BCx: pending 5/9 UCx: pending   Thank you for allowing pharmacy to be a part of this patient's care.  Barrie Folk, PharmD 09/13/2022 12:27 PM

## 2022-09-13 NOTE — Assessment & Plan Note (Signed)
BP stable Titrate home regimen 

## 2022-09-13 NOTE — ED Triage Notes (Signed)
Pt arrived via [facility] from home with reports of central chest pain and back pain, per EMS pt has been c/o pain since yesterday, on arrival pt is anxious, c/o pain all over.

## 2022-09-13 NOTE — Assessment & Plan Note (Signed)
CT imaging w/ asthma and bronchitic changes  Appears stable from a respiratory standpoint at present  Continue home regimen for now  Monitor  Follow closely

## 2022-09-13 NOTE — ED Provider Notes (Signed)
8:36 AM Assumed care for off going team.   Blood pressure (!) 121/55, pulse 83, temperature 98.1 F (36.7 C), temperature source Oral, resp. rate 18, height 5\' 1"  (1.549 m), weight 99.8 kg, SpO2 96 %.  See their HPI for full report but in brief pending repeat trop/CT     IMPRESSION: 1. Normal thoracic and abdominal aorta without aneurysm or dissection. 2. Stable enlargement of the pulmonary arterial trunk suggesting pulmonary hypertension. 3. Patchy ground-glass opacity in both lungs could suggest small airways disease such as asthma constrictive bronchiolitis. Asymmetric edema, atypical/viral pneumonia or hypersensitivity pneumonitis. 4. Stable slipped Nissen fundoplication with a moderate-sized recurrent hiatal hernia. 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 5. Right-sided hydroureteronephrosis without obvious cause. Possible recently passed calculus. No obvious changes of pyelonephritis. Asymmetric right-sided bladder wall thickening could suggest cystitis. Recommend correlation with urinalysis.   Emphysema (ICD10-J43.9).   8:40 AM I reviewed patient's hospital admission from 07/20/2022 where she was admitted for sepsis due to acute pyelonephritis where she was on IV meropenem and then changed to Bactrim.  I reviewed patient's urine culture where it was sensitive to Zosyn, Bactrim, imipenem.   7 days ago her white count was 8.  Reevaluated patient she reports right flank tenderness as well as nausea, temperatures at home up to 100.  She reports increasing dysuria that started 1 to 2 days ago as well.  This can seems consistent with UTI and possibly pyelonephritis given concern for hydroureter on the right.  She does report history of kidney stones but no significant blood in the urine so seems less likely.  I discussed with patient admission versus going home patient wanted to be admitted.  Did discuss with patient's pace team and hopefully can be a short admission.  I changed  her antibiotics to Zosyn given my concern that she was resistant to ceftriaxone previously.  I will discuss the hospital team for admission    Concha Se, MD 09/13/22 1018

## 2022-09-13 NOTE — Assessment & Plan Note (Signed)
+   generalized pain which appears to be chronic  Monitor for now  Cont home tramadol

## 2022-09-13 NOTE — ED Notes (Signed)
Blue and red top sent to lab with save labels.

## 2022-09-13 NOTE — Assessment & Plan Note (Signed)
Cont synthroid 

## 2022-09-13 NOTE — Assessment & Plan Note (Addendum)
Noted right-sided flank pain with worsening urinary incontinence Imaging with Right-sided hydroureteronephrosis with question of passed stone and concern for cystitis WBC 14 UA indicative of infection  Urine culture March 2024 with  ESBL E. coli  Started on IV Zosyn in ER Will discuss with pharmacy about potential transition to carbapenem Repeat urine culture Consider urology consult as clinically indicated

## 2022-09-13 NOTE — Assessment & Plan Note (Signed)
Continue home BuSpar and trazodone

## 2022-09-13 NOTE — Assessment & Plan Note (Signed)
PPI ?

## 2022-09-13 NOTE — ED Provider Notes (Signed)
[facility] Provider Note    Event Date/Time   First MD Initiated Contact with Patient 09/13/22 [phone]     (approximate)  History   Chief Complaint: Chest Pain  HPI  Shelly Freeman is a 72 y.o. female with a past medical history of COPD, gastric reflux, hypertension, anxiety, presents to the emergency department with complaints of diffuse pain.  According to the patient since yesterday she has been experiencing pain all over her body.  Patient describes pain in her head going down to her feet.  Denies any shortness of breath cough or fever.  No vomiting but does states she has had several episodes of loose stool.  Patient does state she suffers from anxiety she has had similar symptoms before.  Physical Exam   Triage Vital Signs: ED Triage Vitals  Enc Vitals Group     BP 09/13/22 0503 (!) 109/44     Pulse Rate 09/13/22 0503 80     Resp 09/13/22 0503 (!) 26     Temp 09/13/22 0503 98.1 F (36.7 C)     Temp Source 09/13/22 0503 Oral     SpO2 09/13/22 0503 98 %     Weight 09/13/22 0515 220 lb 1.6 oz (99.8 kg)     Height 09/13/22 0515 5\' 1"  (1.549 m)     Head Circumference --      Peak Flow --      Pain Score --      Pain Loc --      Pain Edu? --      Excl. in GC? --     Most recent vital signs: Vitals:   09/13/22 0503  BP: (!) 109/44  Pulse: 80  Resp: (!) 26  Temp: 98.1 F (36.7 C)  SpO2: 98%    General: Awake, no distress.  Moderately anxious appearing. CV:  Good peripheral perfusion.  Regular rate and rhythm  Resp:  Normal effort.  Equal breath sounds bilaterally.  Abd:  No distention.  Soft, nontender.  No rebound or guarding.  Benign abdomen.  ED Results / Procedures / Treatments   EKG  EKG viewed and interpreted by myself shows a sinus rhythm at 80 bpm with a narrow QRS, normal axis, normal intervals, nonspecific but no concerning ST changes  RADIOLOGY  I have reviewed and interpreted the chest x-ray images.  Patient appears to  have some small opacities in the right lung field.  No obvious consolidation. Radiology has read the x-ray as negative.  No acute findings compared to 07/20/2022.   MEDICATIONS ORDERED IN ED: Medications  sodium chloride 0.9 % bolus 1,000 mL (has no administration in time range)  LORazepam (ATIVAN) injection 1 mg (has no administration in time range)     IMPRESSION / MDM / ASSESSMENT AND PLAN / ED COURSE  I reviewed the triage vital signs and the nursing notes.  Patient's presentation is most consistent with acute presentation with potential threat to life or bodily function.  Patient presents emergency department for pain all over.  Patient describes headache pain in her arms or legs as well as her chest.  Denies any abdominal pain and she has a benign abdominal exam.  Given the complaint of chest pain in addition to pain all over we will check labs including a CBC CMP and a troponin.  Will obtain a chest x-ray EKG.  We will dose IV fluids Ativan and continue to closely monitor.  Patient's workup is overall reassuring slight leukocytosis however largely  unchanged from historical values.  Chemistry is reassuring.  Troponin is negative.  Chest x-ray is clear EKG shows no concerning findings.  Patient still complaining of pain mostly in her head down into her posterior chest and back.  Given the patient's complaints other lab work reassuring we will proceed with a CTA of the chest abdomen/pelvis to rule out dissection.  We will also check a urine sample.  I believe that the patient's urinalysis and CT are negative the patient could be safely discharged home with PCP follow-up.  Patient agreeable to plan of care.  CTA pending.  Patient care signed out to oncoming provider.  FINAL CLINICAL IMPRESSION(S) / ED DIAGNOSES   Chest pain    Note:  This document was prepared using Dragon voice recognition software and may include unintentional dictation errors.   Minna Antis, MD 09/13/22  [phone]

## 2022-09-13 NOTE — H&P (Addendum)
History and Physical    Patient: Shelly Freeman GEX:[id number] DOB: 11/01/1950 DOA: 09/13/2022 DOS: the patient was seen and examined on 09/13/2022 PCP: Unice Bailey, MD  Patient coming from: Home  Chief Complaint:  Chief Complaint  Patient presents with   Chest Pain   HPI: Shelly Freeman is a 72 y.o. female with medical history significant of COPD, GERD, hypothyroidism, lupus, generalized pain, depression presenting with pyelonephritis.  Patient ports worsening right flank pain over the past 2 to 3 days.  Baseline urinary incontinence which has gotten worse.  Trace hematuria.  Patient reports generalized pain over the entire body that is fairly chronic.  Patient states this has not changed.  No fevers or chills.  Mild nausea.  Mild generalized abdominal pain which is unchanged.  Right flank pain has been significant.  No hemiparesis or confusion.  Reports remote history of kidney stones in the past including lithotripsy.  Patient noted to have been admitted March 15 through March 21 of this year for sepsis secondary to UTI and AKI.  Urine culture showed ESBL E. coli. Presented to the ER afebrile, hemodynamically stable.  White count 14, hemoglobin 12.3, platelets 166, urinalysis indicative of infection.  Creatinine 0.99.  CTA of the chest abdomen pelvis with right-sided hydroureteronephrosis without obvious cause as well as cystitis.  Also with imaging concerning for pulmonary hypertension as well as asthma.  Review of Systems: As mentioned in the history of present illness. All other systems reviewed and are negative. Past Medical History:  Diagnosis Date   Anginal pain (HCC)    Arthritis    Collagen vascular disease (HCC)    COPD (chronic obstructive pulmonary disease) (HCC)    DDD (degenerative disc disease), lumbar 05/14/2016   Depression    Dizziness    Dyspnea    GERD (gastroesophageal reflux disease)    Heart valve problem    per grandaughter need surgery but they told her  may not survive;   History of kidney stones    Hypertension    Hypothyroidism    Lupus (HCC)    Neuromuscular disorder (HCC)    Sleep apnea    no CPAP   Thyroid disease    Vitamin D deficiency 05/14/2016   Past Surgical History:  Procedure Laterality Date   APPENDECTOMY     BREAST BIOPSY Left    neg   CATARACT EXTRACTION W/PHACO Left 11/13/2021   Procedure: CATARACT EXTRACTION PHACO AND INTRAOCULAR LENS PLACEMENT (IOC) LEFT 3.63 00:35.9;  Surgeon: Nevada Crane, MD;  Location: [facility];  Service: Ophthalmology;  Laterality: Left;   CATARACT EXTRACTION W/PHACO Right 12/04/2021   Procedure: CATARACT EXTRACTION PHACO AND INTRAOCULAR LENS PLACEMENT (IOC) RIGHT;  Surgeon: Nevada Crane, MD;  Location: [facility];  Service: Ophthalmology;  Laterality: Right;  3.11 00:32.6   CESAREAN SECTION     x 2   COLONOSCOPY N/A 08/24/2021   Procedure: COLONOSCOPY;  Surgeon: Jaynie Collins, DO;  Location: [facility];  Service: Gastroenterology;  Laterality: N/A;  SPANISH INTERPRETER   COLONOSCOPY WITH PROPOFOL N/A 07/01/2017   Procedure: COLONOSCOPY WITH PROPOFOL;  Surgeon: Scot Jun, MD;  Location: [facility] END[facility]COPY;  Service: Endoscopy;  Laterality: N/A;   ESOPHAGOGASTRODUODENOSCOPY (EGD) WITH PROPOFOL N/A 07/01/2017   Procedure: ESOPHAGOGASTRODUODENOSCOPY (EGD) WITH PROPOFOL;  Surgeon: Scot Jun, MD;  Location: [facility] END[facility]COPY;  Service: Endoscopy;  Laterality: N/A;   GASTRIC BYPASS     LIPOMA EXCISION Left 06/14/2022   Procedure: EXCISION LIPOMA, left chest wall;  Surgeon: Leafy Ro, MD;  Location: [facility] ORS;  Service: General;  Laterality: Left;   LITHOTRIPSY     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   Procedure: TOTAL KNEE ARTHROPLASTY;  Surgeon: Kennedy Bucker, MD;  Location: [facility] ORS;  Service: Orthopedics;  Laterality: Left;   TUBAL LIGATION     Social History:  reports that she has never smoked. She has never used smokeless tobacco.  She reports that she does not drink alcohol and does not use drugs.  Allergies  Allergen Reactions   Cholestyramine Other (See Comments)    severe    Family History  Problem Relation Age of Onset   Breast cancer Maternal Aunt    Breast cancer Cousin     Prior to Admission medications   Medication Sig Start Date End Date Taking? Authorizing Provider  acetaminophen (TYLENOL) 650 MG CR tablet Take 650 mg by mouth 3 (three) times daily as needed for pain. 07/06/22  Yes [provider]  alendronate (FOSAMAX) 70 MG tablet Take 70 mg by mouth once a week. Take with a full glass of water on an empty stomach.   Yes [provider]  busPIRone (BUSPAR) 15 MG tablet Take 15 mg by mouth 2 (two) times daily. 12/05/21  Yes [provider]  celecoxib (CELEBREX) 100 MG capsule Take 1 capsule by mouth 2 (two) times daily. 12/14/21  Yes [provider]  cetirizine (ZYRTEC) 10 MG tablet Take 10 mg by mouth daily.   Yes [provider]  cholecalciferol (VITAMIN D3) 25 MCG (1000 UNIT) tablet Take 1,000 Units by mouth daily. 12/05/21  Yes [provider]  cyanocobalamin (VITAMIN B12) 1000 MCG tablet Take 1,000 mcg by mouth daily. 07/06/22  Yes [provider]  escitalopram (LEXAPRO) 20 MG tablet Take 20 mg by mouth daily.   Yes [provider]  estradiol (ESTRACE) 0.1 MG/GM vaginal cream Place 0.5 g vaginally daily. Insert 1/2 gram into vagina every night as directed for two weeks then use twice weekly on Monday and Friday nights only 07/19/22  Yes [provider]  famotidine (PEPCID) 20 MG tablet Take 20 mg by mouth 2 (two) times daily.   Yes [provider]  ferrous sulfate 325 (65 FE) MG tablet Take 325 mg by mouth every Monday, Wednesday, and Friday.   Yes [provider]  fluticasone (FLONASE) 50 MCG/ACT nasal spray Place into both nostrils daily.   Yes [provider]  folic acid (FOLVITE) 1 MG tablet Take 1  mg by mouth daily.   Yes [provider]  gabapentin (NEURONTIN) 600 MG tablet Take 600 mg by mouth 3 (three) times daily. 05/07/21  Yes [provider]  levothyroxine (SYNTHROID, LEVOTHROID) 75 MCG tablet Take 75 mcg by mouth daily before breakfast.   Yes [provider]  methenamine (HIPREX) 1 g tablet Take 1 g by mouth daily. 07/19/22  Yes [provider]  mirabegron ER (MYRBETRIQ) 25 MG TB24 tablet Take 1 tablet by mouth daily. 08/31/22  Yes [provider]  oxybutynin (DITROPAN-XL) 5 MG 24 hr tablet Take 5 mg by mouth in the morning and at bedtime. 12/05/21  Yes [provider]  pantoprazole (PROTONIX) 20 MG tablet Take 20 mg by mouth daily. 08/08/22  Yes [provider]  Psyllium (METAMUCIL) 0.36 g CAPS Take 0.4 g by mouth daily. Take 4 capsules by mouth daily 06/07/22  Yes [provider]  QUEtiapine (SEROQUEL) 50 MG tablet Take 50 mg by mouth at bedtime.  Yes [provider]  traMADol (ULTRAM) 50 MG tablet Take 1 tablet (50 mg total) by mouth every 6 (six) hours as needed. Patient taking differently: Take 50 mg by mouth 3 (three) times daily as needed for moderate pain. 06/14/22 06/14/23 Yes Pabon, Diego F, MD  traZODone (DESYREL) 100 MG tablet Take 125 mg by mouth at bedtime.   Yes [provider]  traZODone (DESYREL) 50 MG tablet Take 25 mg by mouth at bedtime as needed for sleep. 01/05/21  Yes [provider]  albuterol (VENTOLIN HFA) 108 (90 Base) MCG/ACT inhaler Inhale into the lungs. Inhale 2 puffs into the lungs every 4 (four) hours as needed for wheezing or shortness of breath.    [provider]  calcium carbonate (TUMS EX) 750 MG chewable tablet Chew 1 tablet by mouth 4 (four) times daily as needed for heartburn.    [provider]  diclofenac Sodium (VOLTAREN) 1 % GEL Apply 2 g topically 4 (four) times daily.    [provider]  hydrocortisone cream 1 % Apply 1 application  topically 3 (three) times daily as needed for itching.    [provider]  ipratropium-albuterol (DUONEB) 0.5-2.5 (3) MG/3ML SOLN Take 3 mLs by nebulization every 6 (six) hours as needed (shortness of breath).    [provider]  lidocaine (LIDODERM) 5 % Place 1 patch onto the skin daily. Remove & Discard patch within 12 hours or as directed by MD    [provider]  liver oil-zinc oxide (DESITIN) 40 % ointment Apply 1 Application topically as needed for irritation.    [provider]  metoprolol succinate (TOPROL-XL) 25 MG 24 hr tablet Take 25 mg by mouth daily.     [provider]  nystatin powder Apply 1 Application topically 2 (two) times daily.    [provider]  Olopatadine HCl (PATADAY) 0.2 % SOLN Apply to eye daily as needed.    [provider]    Physical Exam: Vitals:   09/13/22 0503 09/13/22 0515 09/13/22 0600  BP: (!) 109/44  (!) 121/55  Pulse: 80  83  Resp: (!) 26  18  Temp: 98.1 F (36.7 C)    TempSrc: Oral    SpO2: 98%  96%  Weight:  99.8 kg   Height:  5\' 1"  (1.549 m)    Physical Exam Constitutional:      Appearance: She is obese.  HENT:     Head: Normocephalic and atraumatic.     Nose: Nose normal.     Mouth/Throat:     Mouth: Mucous membranes are moist.  Eyes:     Pupils: Pupils are equal, round, and reactive to light.  Cardiovascular:     Rate and Rhythm: Normal rate and regular rhythm.  Pulmonary:     Effort: Pulmonary effort is normal.  Abdominal:     General: Bowel sounds are normal.     Comments: + r flank pain   Musculoskeletal:        General: Normal range of motion.     Cervical back: Normal range of motion.  Skin:    General: Skin is warm.  Neurological:     General: No focal deficit present.  Psychiatric:        Mood and Affect: Mood normal.     Data Reviewed:  There are no new results to review at this time. CT Angio Chest/Abd/Pel for Dissection W and/or Wo  Contrast CLINICAL DATA:  Acute aortic syndrome. Chest and  upper back pain since yesterday.  EXAM: CT ANGIOGRAPHY CHEST, ABDOMEN AND PELVIS  TECHNIQUE: Non-contrast CT of the chest was initially obtained.  Multidetector CT imaging through the chest, abdomen and pelvis was performed using the standard protocol during bolus administration of intravenous contrast. Multiplanar reconstructed images and MIPs were obtained and reviewed to evaluate the vascular anatomy.  RADIATION DOSE REDUCTION: This exam was performed according to the departmental dose-optimization program which includes automated exposure control, adjustment of the mA and/or kV according to patient size and/or use of iterative reconstruction technique.  CONTRAST:  OMNIPAQUE IOHEXOL 350 MG/ML SOLN  COMPARISON:  CT chest 05/14/2022 and CT abdomen/pelvis 07/20/2022  FINDINGS: CTA CHEST FINDINGS  Cardiovascular: The heart is normal in size. No pericardial effusion. The aorta is normal in caliber. No dissection. Minimal scattered atherosclerotic calcifications. The branch vessels are patent. Stable scattered coronary artery calcifications.  Stable enlargement of the pulmonary arterial trunk measuring 3.1 cm and suggesting pulmonary hypertension. The pulmonary arterial tree is well opacified. No filling defects to suggest pulmonary embolism.  Mediastinum/Nodes: No mediastinal or hilar mass or lymphadenopathy. Stable slipped Nissen fundoplication with a moderate-sized recurrent hiatal hernia.  Lungs/Pleura: Stable underlying emphysematous changes and pulmonary scarring. Patchy E ground-glass opacity in both lungs could suggest small airways disease such as asthma constrictive bronchiolitis. Asymmetric edema, atypical/viral pneumonia or hypersensitivity pneumonitis. No focal airspace consolidation. No worrisome pulmonary lesions or pulmonary nodules. No pleural effusions or pleural nodules.  Musculoskeletal:  No breast masses, supraclavicular or axillary adenopathy. The bony thorax is intact.  Review of the MIP images confirms the above findings.  CTA ABDOMEN AND PELVIS FINDINGS  VASCULAR  Aorta: Normal caliber. Minimal scattered atherosclerotic calcifications for age. No dissection.  Celiac: Mild ostial calcifications but no stenosis or dissection.  SMA: Normal  Renals: Normal  IMA: Normal  Inflow: Normal  Veins: Normal  Review of the MIP images confirms the above findings.  NON-VASCULAR  Hepatobiliary: No hepatic lesions or intrahepatic biliary dilatation. The gallbladder is unremarkable. No common bile duct dilatation.  Pancreas: Normal  Spleen: Normal  Adrenals/Urinary Tract: The adrenal glands are normal. The right kidney demonstrates mild hydronephrosis. There is also mild right hydroureter and perinephric interstitial changes. No renal or obstructing ureteral calculi are identified. Could not exclude a recently passed calculus. No definite findings for pyelonephritis recommend correlation with urinalysis. The bladder demonstrates mild asymmetric wall thickening on the right side. Could not exclude cystitis.  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 The small bowel and colon are unremarkable. No inflammatory changes obstructive findings.  Lymphatic: No abdominal or pelvic lymphadenopathy.  Reproductive: The uterus and ovaries are unremarkable.  Other: No pelvic mass or adenopathy. No free pelvic fluid collections. No inguinal mass or adenopathy. No abdominal wall hernia or subcutaneous lesions.  Musculoskeletal: No significant bony findings.  Review of the MIP images confirms the above findings.  IMPRESSION: 1. Normal thoracic and abdominal aorta without aneurysm or dissection. 2. Stable enlargement of the pulmonary arterial trunk suggesting pulmonary  hypertension. 3. Patchy ground-glass opacity in both lungs could suggest small airways disease such as asthma constrictive bronchiolitis. Asymmetric edema, atypical/viral pneumonia or hypersensitivity pneumonitis. 4. Stable slipped Nissen fundoplication with a moderate-sized recurrent hiatal hernia. 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 5. Right-sided hydroureteronephrosis without obvious cause. Possible recently passed calculus. No obvious changes of pyelonephritis. Asymmetric right-sided bladder wall thickening could suggest cystitis. Recommend correlation with urinalysis.  Emphysema (ICD10-J43.9).  Electronically Signed   By: Rudie Meyer M.D.   On: 09/13/2022 08:09 DG Chest Portable 1 View CLINICAL DATA:  Pain in central chest.  EXAM: PORTABLE CHEST 1 VIEW  COMPARISON:  07/20/2022  FINDINGS: Heart size and mediastinal contours are unremarkable. No pleural effusion or edema. No airspace opacities. Scarring noted within the left midlung. Remote right posterior rib fractures.  IMPRESSION: 1. No acute findings. 2. Left midlung scarring.  Electronically Signed   By: Signa Kell M.D.   On: 09/13/2022 05:57  Lab Results  Component Value Date   WBC 14.0 (H) 09/13/2022   HGB 12.3 09/13/2022   HCT 38.2 09/13/2022   MCV 95.5 09/13/2022   PLT 166 09/13/2022   Last metabolic panel Lab Results  Component Value Date   GLUCOSE 117 (H) 09/13/2022   NA 132 (L) 09/13/2022   K 4.0 09/13/2022   CL 103 09/13/2022   CO2 19 (L) 09/13/2022   BUN 17 09/13/2022   CREATININE 0.99 09/13/2022   GFRNONAA >60 09/13/2022   CALCIUM 8.7 (L) 09/13/2022   PHOS 2.7 03/31/2016   PROT 6.3 (L) 09/13/2022   ALBUMIN 3.2 (L) 09/13/2022   BILITOT 0.7 09/13/2022   ALKPHOS 89 09/13/2022   AST 20 09/13/2022   ALT 12 09/13/2022   ANIONGAP 10 09/13/2022    Assessment and Plan: * Pyelonephritis Noted right-sided flank pain with worsening urinary incontinence Imaging with Right-sided  hydroureteronephrosis with question of passed stone and concern for cystitis WBC 14 UA indicative of infection  Urine culture March 2024 with  ESBL E. coli  Started on IV Zosyn in ER Will discuss with pharmacy about potential transition to carbapenem Repeat urine culture Consider urology consult as clinically indicated   History of ESBL E. coli infection 07/2022 Urine culture w/ ESBL EColi sensitive to zosyn  Noted active pyelonephritis and cystitis on imaging  Started on IV zosyn in the ER  Will discuss w/ pharmacy about transition to carbapenem coverage pending urine culture ID consult as clinically indicated  Follow   COPD (chronic obstructive pulmonary disease) (HCC) CT imaging w/ asthma and bronchitic changes  Appears stable from a respiratory standpoint at present  Continue home regimen for now  Monitor  Follow closely  Hypothyroidism Cont synthroid   Essential hypertension BP stable  Titrate home regimen    Primary osteoarthritis involving multiple joints + generalized pain which appears to be chronic  Monitor for now  Cont home tramadol    Depression with anxiety Continue home BuSpar and trazodone   GERD (gastroesophageal reflux disease) PPI   Greater than 50% was spent in counseling and coordination of care with patient Total encounter time 80 minutes or more    Advance Care Planning:   Code Status: Prior   Consults: None. Consider urology and infectious disease as clinically indicated   Family Communication: No family at the bedside     Severity of Illness: The appropriate patient status for this patient is INPATIENT. Inpatient status is judged to be reasonable and necessary in order to provide the required intensity of service to ensure the patient's safety. The patient's presenting symptoms, physical exam findings, and initial radiographic and laboratory data in the context of their chronic comorbidities is felt to place them at high risk for  further clinical deterioration. Furthermore, it is not anticipated that the patient will be medically stable for discharge from the hospital within 2 midnights of admission.   * I certify that at the point of admission it is  my clinical judgment that the patient will require inpatient hospital care spanning beyond 2 midnights from the point of admission due to high intensity of service, high risk for further deterioration and high frequency of surveillance required.*  Author: Floydene Flock, MD 09/13/2022 11:43 AM  For on call review [email or web address].

## 2022-09-13 NOTE — Assessment & Plan Note (Signed)
07/2022 Urine culture w/ ESBL EColi sensitive to zosyn  Noted active pyelonephritis and cystitis on imaging  Started on IV zosyn in the ER  Will discuss w/ pharmacy about transition to carbapenem coverage pending urine culture ID consult as clinically indicated  Follow

## 2022-09-13 NOTE — Assessment & Plan Note (Signed)
Appears stable from a respiratory standpoint Continue home regimen Follow

## 2022-09-14 LAB — CBC
HCT: 39.3 % (ref 36.0–46.0)
Hemoglobin: 12.3 g/dL (ref 12.0–15.0)
MCH: 30.7 pg (ref 26.0–34.0)
MCHC: 31.3 g/dL (ref 30.0–36.0)
MCV: 98 fL (ref 80.0–100.0)
Platelets: 174 10*3/uL (ref 150–400)
RBC: 4.01 MIL/uL (ref 3.87–5.11)
RDW: 13.5 % (ref 11.5–15.5)
WBC: 11.7 10*3/uL — ABNORMAL HIGH (ref 4.0–10.5)
nRBC: 0 % (ref 0.0–0.2)

## 2022-09-14 LAB — BASIC METABOLIC PANEL
Anion gap: 7 (ref 5–15)
BUN: 14 mg/dL (ref 8–23)
CO2: 23 mmol/L (ref 22–32)
Calcium: 8.6 mg/dL — ABNORMAL LOW (ref 8.9–10.3)
Chloride: 107 mmol/L (ref 98–111)
Creatinine, Ser: 0.99 mg/dL (ref 0.44–1.00)
GFR, Estimated: >60 mL/min (ref >60)
Glucose, Bld: 100 mg/dL — ABNORMAL HIGH (ref 70–99)
Potassium: 3.8 mmol/L (ref 3.5–5.1)
Sodium: 137 mmol/L (ref 135–145)

## 2022-09-14 LAB — URINE CULTURE: Culture: 80000 — AB

## 2022-09-14 MED ORDER — ENOXAPARIN SODIUM 60 MG/0.6ML IJ SOSY
0.5000 mg/kg | PREFILLED_SYRINGE | INTRAMUSCULAR | Status: DC
  Administered 2022-09-14 – 2022-09-17 (×4): 50 mg via SUBCUTANEOUS
  Filled 2022-09-14 (×4): qty 0.6

## 2022-09-14 MED ORDER — HYDROMORPHONE HCL 1 MG/ML IJ SOLN
0.5000 mg | INTRAMUSCULAR | Status: DC | PRN
  Administered 2022-09-14 – 2022-09-16 (×11): 0.5 mg via INTRAVENOUS
  Filled 2022-09-14 (×11): qty 0.5

## 2022-09-14 MED ORDER — SODIUM CHLORIDE 0.9 % IV SOLN
1.0000 g | Freq: Three times a day (TID) | INTRAVENOUS | Status: DC
  Administered 2022-09-14 – 2022-09-18 (×13): 1 g via INTRAVENOUS
  Filled 2022-09-14: qty 20
  Filled 2022-09-14: qty 1
  Filled 2022-09-14 (×4): qty 20
  Filled 2022-09-14: qty 1
  Filled 2022-09-14: qty 20
  Filled 2022-09-14: qty 1
  Filled 2022-09-14 (×2): qty 20
  Filled 2022-09-14 (×3): qty 1
  Filled 2022-09-14: qty 20

## 2022-09-14 NOTE — Progress Notes (Signed)
PROGRESS NOTE  Shelly Freeman  WUJ:[id number] DOB: 30-Jul-1950 DOA: 09/13/2022 PCP: Unice Bailey, MD   Brief Narrative: Patient is a 72 year old female with history of COPD, GERD, nephrolithiasis hypothyroidism, lupus, depression, chronic pain syndrome who presented with complaint of right flank pain about 2 to 3 days, generalized body ache.  Patient was recently admitted on March and was managed for sepsis secondary to UTI, AKI.  Urine culture at that time showed ESBL E. coli.  On presentation she was afebrile, hemodynamically stable.  Lab work showed WC count of 14, UA was suspicious of UTI.  CT chest/abdomen/pelvis showed right-sided hydroureteronephrosis as well as cystitis.  Patient started on antibiotic.    Assessment & Plan:  Principal Problem:   Pyelonephritis Active Problems:   History of ESBL E. coli infection   COPD (chronic obstructive pulmonary disease) (HCC)   Hypothyroidism   Essential hypertension   GERD (gastroesophageal reflux disease)   Depression with anxiety   Primary osteoarthritis involving multiple joints   Cystitis  Right-sided flank pain/UTI: Presented with right-sided flank pain. CT imaging showed right-sided hydroureteronephrosis without obvious cause, suspected to be from recently passed calculus.  No evidence of pyelonephritis as per CT.  Asymmetric right sided bladder wall thickening suggesting cystitis. Case was discussed with urology this morning, Dr. Vanna Scotland.  She recommended follow-up with urology as an outpatient and she will arrange it  continue current antibiotics.  History of ESBL E. coli infection so currently on meropenem.  Urine culture showing 80,000 colonies of gram-negative rods.  Blood cultures have not shown any growth.  Depending upon the culture report, will determine if you need ID on board. Leukocytosis improving this morning.  History of COPD/pulmonary hypertension: As seen on the CT.  We recommend outpatient follow-up with  pulmonology.  CT also showed patchy groundglass opacity in both lungs.  Patient is asymptomatic and is saturating  fine on room air.  Continue home regimen  Hypothyroidism: Continue Synthyroid  Hypertension: Currently blood stable.  Continue home medications  Chronic pain syndrome/osteoarthritis of multiple joints: Continue supportive care, pain management.  Patient has chronic pain syndrome and always complains of back pain, generalized pain  Depression/anxiety: On BuSpar, trazodone  GERD: Continue PPI  Obesity: BMI of 41.5        DVT prophylaxis:Lovenox     Code Status: Prior  Family Communication: None at the bedside  Patient status:Inpatient  Patient is from :home  Anticipated discharge NF:AOZH  Estimated DC date:1-2 days   Consultants: None  Procedures:None  Antimicrobials:  Anti-infectives (From admission, onward)    Start     Dose/Rate Route Frequency Ordered Stop   09/14/22 0815  meropenem (MERREM) 1 g in sodium chloride 0.9 % 100 mL IVPB        1 g 200 mL/hr over 30 Minutes Intravenous Every 8 hours 09/14/22 0720     09/13/22 0845  piperacillin-tazobactam (ZOSYN) IVPB 3.375 g        3.375 g 100 mL/hr over 30 Minutes Intravenous  Once 09/13/22 0842 09/13/22 1241   09/13/22 0830  cefTRIAXone (ROCEPHIN) 2 g in sodium chloride 0.9 % 100 mL IVPB  Status:  Discontinued        2 g 200 mL/hr over 30 Minutes Intravenous  Once 09/13/22 0865 09/13/22 1157       Subjective: Patient seen and examined at bedside today.  Hemodynamically stable.  Afebrile.  Lying in bed.  Complains of pain on her back and generalized abdominal pain.  Abdomen soft,  nondistended nontender on examination.  Objective: Vitals:   09/13/22 1714 09/13/22 1753 09/13/22 1943 09/14/22 0509  BP:  (!) 129/51 (!) 134/55 (!) 123/59  Pulse:  74 80 80  Resp:  20 18 19   Temp:  98.3 F (36.8 C) 98.3 F (36.8 C) 98.3 F (36.8 C)  TempSrc:  Oral Oral Oral  SpO2: 96% 100% 100% 99%  Weight:       Height:        Intake/Output Summary (Last 24 hours) at 09/14/2022 1039 Last data filed at 09/14/2022 1096 Gross per 24 hour  Intake --  Output 1200 ml  Net -1200 ml   Filed Weights   09/13/22 0515  Weight: 99.8 kg    Examination:  General exam: Crying with pain, lying in bed, morbidly obese HEENT: PERRL Respiratory system:  no wheezes or crackles  Cardiovascular system: S1 & S2 heard, RRR.  Gastrointestinal system: Abdomen is mildly distended, soft and nontender. Central nervous system: Alert and oriented Extremities: No edema, no clubbing ,no cyanosis Skin: No rashes, no ulcers,no icterus     Data Reviewed: I have personally reviewed following labs and imaging studies  CBC: Recent Labs  Lab 09/13/22 0523 09/14/22 0738  WBC 14.0* 11.7*  HGB 12.3 12.3  HCT 38.2 39.3  MCV 95.5 98.0  PLT 166 174   Basic Metabolic Panel: Recent Labs  Lab 09/13/22 0523 09/14/22 0738  NA 132* 137  K 4.0 3.8  CL 103 107  CO2 19* 23  GLUCOSE 117* 100*  BUN 17 14  CREATININE 0.99 0.99  CALCIUM 8.7* 8.6*     Recent Results (from the past 240 hour(s))  Urine Culture     Status: Abnormal (Preliminary result)   Collection Time: 09/13/22  6:44 AM   Specimen: Urine, Clean Catch  Result Value Ref Range Status   Specimen Description   Final    URINE, CLEAN CATCH Performed at [facility], [address] [place], [place] [address]    Special Requests   Final    NONE Performed at [facility], [address], [place], [place] [address]    Culture (A)  Final    80,000 COLONIES/mL Romie Minus NEGATIVE RODS SUSCEPTIBILITIES TO FOLLOW Performed at [facility], [address] [place], [place] [address]    Report Status PENDING  Incomplete  Blood culture (routine x 2)     Status: None (Preliminary result)   Collection Time: 09/13/22 12:39 PM   Specimen: BLOOD RIGHT HAND  Result Value Ref Range Status   Specimen Description BLOOD RIGHT HAND  Final    Special Requests   Final    BOTTLES DRAWN AEROBIC AND ANAEROBIC Blood Culture adequate volume   Culture   Final    NO GROWTH < 24 HOURS Performed at [facility], [address], [place], [place] [address]    Report Status PENDING  Incomplete  Blood culture (routine x 2)     Status: None (Preliminary result)   Collection Time: 09/13/22 12:39 PM   Specimen: BLOOD LEFT ARM  Result Value Ref Range Status   Specimen Description BLOOD LEFT ARM  Final   Special Requests   Final    BOTTLES DRAWN AEROBIC AND ANAEROBIC Blood Culture adequate volume   Culture   Final    NO GROWTH < 24 HOURS Performed at [facility], [address] [place], [place] [address]    Report Status PENDING  Incomplete     Radiology Studies:  CT Angio Chest/Abd/Pel for Dissection W and/or Wo Contrast  Result Date: 09/13/2022 CLINICAL DATA:  Acute aortic syndrome. Chest and upper back pain since yesterday. EXAM: CT ANGIOGRAPHY CHEST, ABDOMEN AND PELVIS TECHNIQUE: Non-contrast CT of the chest was initially obtained. Multidetector CT imaging through the chest, abdomen and pelvis was performed using the standard protocol during bolus administration of intravenous contrast. Multiplanar reconstructed images and MIPs were obtained and reviewed to evaluate the vascular anatomy. RADIATION DOSE REDUCTION: This exam was performed according to the departmental dose-optimization program which includes automated exposure control, adjustment of the mA and/or kV according to patient size and/or use of iterative reconstruction technique. CONTRAST:  OMNIPAQUE IOHEXOL 350 MG/ML SOLN COMPARISON:  CT chest 05/14/2022 and CT abdomen/pelvis 07/20/2022 FINDINGS: CTA CHEST FINDINGS Cardiovascular: The heart is normal in size. No pericardial effusion. The aorta is normal in caliber. No dissection. Minimal scattered atherosclerotic calcifications. The branch vessels are patent. Stable scattered coronary artery calcifications.  Stable enlargement of the pulmonary arterial trunk measuring 3.1 cm and suggesting pulmonary hypertension. The pulmonary arterial tree is well opacified. No filling defects to suggest pulmonary embolism. Mediastinum/Nodes: No mediastinal or hilar mass or lymphadenopathy. Stable slipped Nissen fundoplication with a moderate-sized recurrent hiatal hernia. Lungs/Pleura: Stable underlying emphysematous changes and pulmonary scarring. Patchy E ground-glass opacity in both lungs could suggest small airways disease such as asthma constrictive bronchiolitis. Asymmetric edema, atypical/viral pneumonia or hypersensitivity pneumonitis. No focal airspace consolidation. No worrisome pulmonary lesions or pulmonary nodules. No pleural effusions or pleural nodules. Musculoskeletal: No breast masses, supraclavicular or axillary adenopathy. The bony thorax is intact. Review of the MIP images confirms the above findings. CTA ABDOMEN AND PELVIS FINDINGS VASCULAR Aorta: Normal caliber. Minimal scattered atherosclerotic calcifications for age. No dissection. Celiac: Mild ostial calcifications but no stenosis or dissection. SMA: Normal Renals: Normal IMA: Normal Inflow: Normal Veins: Normal Review of the MIP images confirms the above findings. NON-VASCULAR Hepatobiliary: No hepatic lesions or intrahepatic biliary dilatation. The gallbladder is unremarkable. No common bile duct dilatation. Pancreas: Normal Spleen: Normal Adrenals/Urinary Tract: The adrenal glands are normal. The right kidney demonstrates mild hydronephrosis. There is also mild right hydroureter and perinephric interstitial changes. No renal or obstructing ureteral calculi are identified. Could not exclude a recently passed calculus. No definite findings for pyelonephritis recommend correlation with urinalysis. The bladder demonstrates mild asymmetric wall thickening on the right side. Could not exclude cystitis. Stomach/Bowel: Surgical changes involving the stomach with a  slipped Nissen fundoplication and surgical changes from gastric bypass surgery. The small bowel and colon are unremarkable. No inflammatory changes obstructive findings. Lymphatic: No abdominal or pelvic lymphadenopathy. Reproductive: The uterus and ovaries are unremarkable. Other: No pelvic mass or adenopathy. No free pelvic fluid collections. No inguinal mass or adenopathy. No abdominal wall hernia or subcutaneous lesions. Musculoskeletal: No significant bony findings. Review of the MIP images confirms the above findings. IMPRESSION: 1. Normal thoracic and abdominal aorta without aneurysm or dissection. 2. Stable enlargement of the pulmonary arterial trunk suggesting pulmonary hypertension. 3. Patchy ground-glass opacity in both lungs could suggest small airways disease such as asthma constrictive bronchiolitis. Asymmetric edema, atypical/viral pneumonia or hypersensitivity pneumonitis. 4. Stable slipped Nissen fundoplication with a moderate-sized recurrent hiatal hernia. 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 5. Right-sided hydroureteronephrosis without obvious cause. Possible recently passed calculus. No obvious changes of pyelonephritis. Asymmetric right-sided bladder wall thickening could suggest cystitis. Recommend correlation with urinalysis. Emphysema (ICD10-J43.9). Electronically Signed   By: Rudie Meyer M.D.   On: 09/13/2022 08:09  DG Chest Portable 1 View  Result Date: 09/13/2022 CLINICAL DATA:  Pain in central chest. EXAM: PORTABLE CHEST 1 VIEW COMPARISON:  07/20/2022 FINDINGS: Heart size and mediastinal contours are unremarkable. No pleural effusion or edema. No airspace opacities. Scarring noted within the left midlung. Remote right posterior rib fractures. IMPRESSION: 1. No acute findings. 2. Left midlung scarring. Electronically Signed   By: Signa Kell M.D.   On: 09/13/2022 05:57    Scheduled Meds:  busPIRone  15 mg Oral BID   gabapentin  600 mg Oral TID   levothyroxine  75 mcg  Oral QAC breakfast   metoprolol succinate  25 mg Oral Daily   mirabegron ER  25 mg Oral Daily   pantoprazole  20 mg Oral Daily   Continuous Infusions:  meropenem (MERREM) IV       LOS: 1 day   Burnadette Pop, MD [facility] P5/02/2023, 10:39 AM

## 2022-09-14 NOTE — Consult Note (Signed)
Pharmacy Antibiotic Note  Shelly Freeman is a 72 y.o. female admitted on 09/13/2022 with pyelonephritis.  Pharmacy has been consulted for meropenem dosing.  Urine culture from 07/20/22: ESBL E. Coli (S= meropenem, Zosyn, SMX/TMP, Macrobid, gent)  Plan: Continue meropenem IV 1 gram every 8 hours Follow renal function and cultures for adjustments  Height: 5\' 1"  (154.9 cm) Weight: 99.8 kg (220 lb 1.6 oz) IBW/kg (Calculated) : 47.8  Temp (24hrs), Avg:98.3 F (36.8 C), Min:98.1 F (36.7 C), Max:98.5 F (36.9 C)  Recent Labs  Lab 09/13/22 0523 09/13/22 1239 09/13/22 1437 09/14/22 0738  WBC 14.0*  --   --   --   CREATININE 0.99  --   --  0.99  LATICACIDVEN  --  1.2 1.0  --      Estimated Creatinine Clearance: 57.3 mL/min (by C-G formula based on SCr of 0.99 mg/dL).    Allergies  Allergen Reactions   Cholestyramine Other (See Comments)    severe    Antimicrobials this admission: Meropenem 5/9 >>  Ceftriaxone x 1 5/9 Zosyn x 1 5/9  Dose adjustments this admission: N/A  Microbiology results: 5/9 BCx: NG < 24 hours 5/9 UCx: E. Coli - awaiting susceptibilities  Thank you for allowing pharmacy to be a part of this patient's care.    Elliot Gurney, PharmD, BCPS Clinical Pharmacist  09/14/2022 8:10 AM

## 2022-09-14 NOTE — TOC Initial Note (Signed)
Transition of Care [facility]) - Initial/Assessment Note    Patient Details  Name: Shelly Freeman MRN: [id number] Date of Birth: 1950/11/24  [facility]) CM/SW Contact:    Chapman Fitch, RN Phone Number: 09/14/2022, 2:39 PM  Clinical Narrative:                    Admitted for: UTI Admitted from: From home with [facility] PCP:Dr Vanhorn through [facility] Current home health/prior home health/DME: [facility]  Per Charisse March with [facility] patient lives at home alone.  Comes to [facility] 5 days a week  Plan for patient to return home at discharge, and [facility] to transport.  Per Charisse March patient ambulates independently with [facility].  Per Charisse March with  [facility] if patient is not medically ready for discharge today then she would not be able to discharge until Monday.  TOC supervisor notified.      Patient Goals and CMS Choice            Expected Discharge Plan and Services                                              Prior Living Arrangements/Services                       Activities of Daily Living Home Assistive Devices/Equipment: Dan Humphreys (specify type) ADL Screening (condition at time of admission) Patient's cognitive ability adequate to safely complete daily activities?: Yes Is the patient deaf or have difficulty hearing?: No Does the patient have difficulty seeing, even when wearing glasses/contacts?: No Does the patient have difficulty concentrating, remembering, or making decisions?: No Patient able to express need for assistance with ADLs?: Yes Does the patient have difficulty dressing or bathing?: No Independently performs ADLs?: Yes (appropriate for developmental age) Does the patient have difficulty walking or climbing stairs?: No Weakness of Legs: None Weakness of Arms/Hands: None  Permission Sought/Granted                  Emotional Assessment              Admission diagnosis:  Pyelonephritis [N12] Chest pain, unspecified type  [R07.9] Patient Active Problem List   Diagnosis Date Noted   Cystitis 09/13/2022   History of ESBL E. 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   Bilateral pneumonia 08/29/2020   AKI (acute kidney injury) (HCC) 08/29/2020   COPD (chronic obstructive pulmonary disease) (HCC) 08/29/2020   Fall at home, initial encounter 08/29/2020   CAP (community acquired pneumonia) 08/29/2020   Osteoarthritis of left knee 10/01/2019   Diarrhea 09/17/2019   Dizziness 09/17/2019   Other fatigue 06/09/2019   Shortness of breath 06/09/2019   Obesity, Class III, BMI 40-49.9 (morbid obesity) (HCC) 05/23/2019   UTI (urinary tract infection) 05/23/2019   Right rib fracture 05/23/2019   SIRS (systemic inflammatory response syndrome) (HCC)  05/23/2019   Bacteremia due to Gram-negative bacteria 05/23/2019   Chest pain at rest 05/19/2019   Pain in rib 05/19/2019   Acute bronchiolitis 01/28/2019   Iron deficiency anemia due to chronic blood loss 10/29/2018   Primary osteoarthritis involving multiple joints 10/29/2018   Right carpal tunnel syndrome 07/31/2017   Cervical radiculopathy 07/16/2017   Left anterior knee pain 10/24/2016   Right upper quadrant pain 10/24/2016   Urination pain 06/26/2016   DDD (degenerative disc disease), cervical 05/14/2016   Lipoma of skin 05/14/2016   Vitamin D deficiency 05/14/2016   Major neurocognitive disorder, due to vascular disease, with behavioral disturbance, mild  (HCC) 04/15/2016   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/04/2016   Lupus (HCC) 04/04/2016   GERD (gastroesophageal reflux disease) 04/04/2016   COPD with acute bronchitis (HCC) 04/04/2016   Acute respiratory failure with hypoxia (HCC) 04/03/2016   Encephalopathy, metabolic 04/03/2016   Hypokalemia 04/03/2016   Pain in limb 04/03/2016   Hypothyroidism 04/03/2016   Essential hypertension 04/03/2016   Intentional opiate overdose (HCC) 03/30/2016   Age related osteoporosis 03/19/2016   Right arm pain 03/19/2016   Encounter for long-term (current) use of high-risk medication 03/19/2016   Primary osteoarthritis of both knees 03/19/2016   PCP:  Unice Bailey, MD Pharmacy:   [facility] - [place], [place] - [address] [place] [address] Phone: [phone] Fax: [phone]  [facility] - [place], [place] - [address] [place] [address] Phone: [phone] Fax: [phone]     Social Determinants of Health (SDOH) Social History: SDOH Screenings   Food Insecurity: No Food Insecurity (09/13/2022)  Housing: Low Risk  (09/13/2022)  Transportation Needs: No Transportation Needs (09/13/2022)  Utilities: Not At Risk (09/13/2022)  Alcohol Screen: Low Risk  (12/09/2017)  Tobacco Use: Low Risk  (09/13/2022)   SDOH Interventions:     Readmission Risk Interventions    01/28/2021   12:19 PM  Readmission Risk Prevention Plan  Transportation Screening Complete  PCP or Specialist Appt within 3-5 Days Complete  HRI or Home Care Consult Complete  Social Work Consult for Recovery Care Planning/Counseling Complete  Palliative Care Screening Not Applicable  Medication Review Oceanographer) Complete

## 2022-09-14 NOTE — Plan of Care (Signed)

## 2022-09-15 DIAGNOSIS — E039 Hypothyroidism, unspecified: Secondary | ICD-10-CM

## 2022-09-15 DIAGNOSIS — I10 Essential (primary) hypertension: Secondary | ICD-10-CM

## 2022-09-15 DIAGNOSIS — Z8619 Personal history of other infectious and parasitic diseases: Secondary | ICD-10-CM | POA: Diagnosis not present

## 2022-09-15 DIAGNOSIS — J449 Chronic obstructive pulmonary disease, unspecified: Secondary | ICD-10-CM | POA: Diagnosis not present

## 2022-09-15 DIAGNOSIS — N12 Tubulo-interstitial nephritis, not specified as acute or chronic: Secondary | ICD-10-CM | POA: Diagnosis not present

## 2022-09-15 LAB — CBC
HCT: 41.6 % (ref 36.0–46.0)
Hemoglobin: 12.9 g/dL (ref 12.0–15.0)
MCH: 30.9 pg (ref 26.0–34.0)
MCHC: 31 g/dL (ref 30.0–36.0)
MCV: 99.5 fL (ref 80.0–100.0)
Platelets: 190 10*3/uL (ref 150–400)
RBC: 4.18 MIL/uL (ref 3.87–5.11)
RDW: 13.4 % (ref 11.5–15.5)
WBC: 8.3 10*3/uL (ref 4.0–10.5)
nRBC: 0 % (ref 0.0–0.2)

## 2022-09-15 LAB — GASTROINTESTINAL PANEL BY PCR, STOOL (REPLACES STOOL CULTURE)

## 2022-09-15 LAB — C DIFFICILE QUICK SCREEN W PCR REFLEX
C Diff antigen: NEGATIVE
C Diff interpretation: NOT DETECTED
C Diff toxin: NEGATIVE

## 2022-09-15 LAB — URINE CULTURE

## 2022-09-15 LAB — CULTURE, BLOOD (ROUTINE X 2): Culture: NO GROWTH

## 2022-09-15 MED ORDER — ONDANSETRON HCL 4 MG/2ML IJ SOLN
4.0000 mg | Freq: Four times a day (QID) | INTRAMUSCULAR | Status: DC | PRN
  Administered 2022-09-16 – 2022-09-17 (×2): 4 mg via INTRAVENOUS
  Filled 2022-09-15 (×2): qty 2

## 2022-09-15 NOTE — Plan of Care (Signed)

## 2022-09-15 NOTE — Evaluation (Signed)
Physical Therapy Evaluation Patient Details Name: Shelly Freeman MRN: [id number] DOB: 1950-07-03 Today's Date: 09/15/2022  History of Present Illness  Pt admitted to [facility] on 09/13/22 for c/o central chest and back pain. Baseline incontinence has gotten worse. Dx with pyelonephritis vs cystitis. Significant PMH includes: COPD, gastric reflux, HTN, hypothyroidism, lupus, depression, and anxiety.   Clinical Impression  Pt is a 72 year old F admitted to hospital on 09/13/22 for pyelonephritis. At baseline, pt has assist for bathing, uses RW for ambulation, and attends the [facility] program 5x/wk.   Pt presents with decreased activity tolerance, mild BLE weakness, decreased standing balance, and increased pain levels, resulting in impaired functional mobility from baseline. Due to deficits, pt required mod I for bed mobility, CGA for transfers with RW, and CGA to ambulate short distance at bedside with RW. Further mobility limited secondary to c/o severe dizziness when standing, and feeling like the room is spinning. Gait deficits including (narrow BOS, decreased step length/foot clearance), pain levels, and decreased balance currently increase her risk of falls with gait.   Deficits limit the pt's ability to safely and independently perform ADL's, transfer, and ambulate. Pt will benefit from acute skilled PT services to address deficits for return to baseline function. Pt with limited assistance at home. Based on current presentation it is likely that she will require additional daily assist at home for completion of ADL's and IADL's. Pt will benefit from continued therapy services, as well, at DC.         Recommendations for follow up therapy are one component of a multi-disciplinary discharge planning process, led by the attending physician.  Recommendations may be updated based on patient status, additional functional criteria and insurance authorization.  Follow Up Recommendations Can patient physically  be transported by private vehicle: Yes     Assistance Recommended at Discharge Frequent or constant Supervision/Assistance  Patient can return home with the following  A little help with walking and/or transfers;A little help with bathing/dressing/bathroom;Assistance with cooking/housework;Assist for transportation    Equipment Recommendations None recommended by PT  Recommendations for Other Services  OT consult    Functional Status Assessment Patient has had a recent decline in their functional status and demonstrates the ability to make significant improvements in function in a reasonable and predictable amount of time.     Precautions / Restrictions Precautions Precautions: Fall Restrictions Weight Bearing Restrictions: No      Mobility  Bed Mobility Overal bed mobility: Modified Independent             General bed mobility comments: increased time/effort due to abomdinal pain    Transfers Overall transfer level: Needs assistance Equipment used: Rolling walker (2 wheels) Transfers: Sit to/from Stand Sit to Stand: Min guard           General transfer comment: increased time/effort with BUE support on RW    Ambulation/Gait Ambulation/Gait assistance: Min guard Gait Distance (Feet): 2 Feet Assistive device: Rolling walker (2 wheels)         General Gait Details: for safety to ambulate short distance in room due to c/o severe dizziness. Demonstrates slowed cadence, decreased step length/foot clearance bil, and narrow BOS.      Balance Overall balance assessment: Needs assistance   Sitting balance-Leahy Scale: Normal     Standing balance support: During functional activity, Bilateral upper extremity supported Standing balance-Leahy Scale: Fair Standing balance comment: standing balance in RW  Pertinent Vitals/Pain Pain Assessment Pain Assessment: 0-10 Pain Score: 8  Pain Location: stomach Pain  Descriptors / Indicators: Aching Pain Intervention(s): Limited activity within patient's tolerance, Monitored during session, Repositioned    Home Living Family/patient expects to be discharged to:: Private residence Living Arrangements: Alone Available Help at Discharge:  (None; states she has [facility] helper come once a week to assist with bathing) Type of Home: Apartment Home Access: Level entry       Home Layout: One level Home Equipment: Agricultural consultant (2 wheels);Wheelchair Financial trader (4 wheels);Cane - single point;Tub bench;BSC/3in1      Prior Function               Mobility Comments: Pt reports she's ambulatory with RW, living alone but attends [facility] during the day. Also uses a w/c.       Hand Dominance   Dominant Hand: Right    Extremity/Trunk Assessment   Upper Extremity Assessment Upper Extremity Assessment: Overall WFL for tasks assessed    Lower Extremity Assessment Lower Extremity Assessment: Overall WFL for tasks assessed       Communication   Communication: No difficulties  Cognition Arousal/Alertness: Awake/alert Behavior During Therapy: WFL for tasks assessed/performed Overall Cognitive Status: Within Functional Limits for tasks assessed                                             Exercises Other Exercises Other Exercises: Participates in bed mobility, transfers, and gait with RW. Limited secondary to c/o dizziness. Other Exercises: Pt educated re: PT role/POC, DC recommendations, and safety with functional mobility. She verbalized understanding.   Assessment/Plan    PT Assessment Patient needs continued PT services  PT Problem List Decreased strength;Decreased activity tolerance;Decreased balance;Decreased mobility;Pain       PT Treatment Interventions DME instruction;Gait training;Therapeutic exercise;Therapeutic activities;Functional mobility training;Balance training;Neuromuscular re-education;Patient/family  education    PT Goals (Current goals can be found in the Care Plan section)  Acute Rehab PT Goals Patient Stated Goal: "go home" PT Goal Formulation: With patient Time For Goal Achievement: 10/06/22 Potential to Achieve Goals: Fair    Frequency Min 2X/week        AM-PAC PT "6 Clicks" Mobility  Outcome Measure Help needed turning from your back to your side while in a flat bed without using bedrails?: None Help needed moving from lying on your back to sitting on the side of a flat bed without using bedrails?: None Help needed moving to and from a bed to a chair (including a wheelchair)?: A Little Help needed standing up from a chair using your arms (e.g., wheelchair or bedside chair)?: A Little Help needed to walk in hospital room?: A Little Help needed climbing 3-5 steps with a railing? : A Little 6 Click Score: 20    End of Session Equipment Utilized During Treatment: Gait belt Activity Tolerance: Patient tolerated treatment well;Patient limited by pain (dizziness) Patient left: in bed;with call bell/phone within reach (no bed alarm warranted; pt safe for IND transfers to/from [facility] for toileting) Nurse Communication: Mobility status PT Visit Diagnosis: Unsteadiness on feet (R26.81);Difficulty in walking, not elsewhere classified (R26.2);Pain Pain - Right/Left:  (stomach)    Time: 1610-9604 PT Time Calculation (min) (ACUTE ONLY): 18 min   Charges:   PT Evaluation $PT Eval Low Complexity: 1 Low         Vira Blanco, PT, DPT  12:37 PM,09/15/22 Physical Therapist - [facility]

## 2022-09-15 NOTE — Progress Notes (Signed)
PROGRESS NOTE  Shelly Freeman  WUJ:[id number] DOB: 1950/09/26 DOA: 09/13/2022 PCP: Unice Bailey, MD   Brief Narrative: Patient is a 72 year old female with history of COPD, GERD, nephrolithiasis hypothyroidism, lupus, depression, chronic pain syndrome who presented with complaint of right flank pain about 2 to 3 days, generalized body ache.  Patient was recently admitted on March and was managed for sepsis secondary to UTI, AKI.  Urine culture at that time showed ESBL E. coli.  On presentation she was afebrile, hemodynamically stable.  Lab work showed WC count of 14, UA was suspicious of UTI.  CT chest/abdomen/pelvis showed right-sided hydroureteronephrosis as well as cystitis.  Patient started on antibiotic.    5/11: Vital stable.  Continue to have lower abdominal and flank pain.  Urine cultures again with ESBL E. coli.  PT is recommending SNF but patient wants to go home, need to clarify from [facility] whether she can get home health while staying on [facility] program.  She will not be able to return until Monday as [facility] has to pick her up.  Assessment & Plan:  Principal Problem:   Pyelonephritis Active Problems:   History of ESBL E. coli infection   COPD (chronic obstructive pulmonary disease) (HCC)   Hypothyroidism   Essential hypertension   GERD (gastroesophageal reflux disease)   Depression with anxiety   Primary osteoarthritis involving multiple joints   Cystitis  Right-sided flank pain/UTI: Presented with right-sided flank pain. CT imaging showed right-sided hydroureteronephrosis without obvious cause, suspected to be from recently passed calculus.  No evidence of pyelonephritis as per CT.  Asymmetric right sided bladder wall thickening suggesting cystitis. Case was discussed with urology this morning, Dr. Vanna Scotland.  She recommended follow-up with urology as an outpatient and she will arrange it  continue current antibiotics.  History of ESBL E. coli infection so currently on  meropenem.  Repeat urine culture remain the same blood cultures have not shown any growth.  -Continue with meropenem  History of COPD/pulmonary hypertension: As seen on the CT.  We recommend outpatient follow-up with pulmonology.  CT also showed patchy groundglass opacity in both lungs.  Patient is asymptomatic and is saturating  fine on room air.  Continue home regimen  Hypothyroidism: Continue Synthyroid  Hypertension: Currently blood stable.  Continue home medications  Chronic pain syndrome/osteoarthritis of multiple joints: Continue supportive care, pain management.  Patient has chronic pain syndrome and always complains of back pain, generalized pain  Depression/anxiety: On BuSpar, trazodone  GERD: Continue PPI  Obesity: BMI of 41.5  DVT prophylaxis:Lovenox     Code Status: Prior  Family Communication: None at the bedside  Patient status:Inpatient  Patient is from :home  Anticipated discharge NF:AOZH  Estimated DC date:1-2 days   Consultants: None  Procedures:None  Antimicrobials:  Anti-infectives (From admission, onward)    Start     Dose/Rate Route Frequency Ordered Stop   09/14/22 0815  meropenem (MERREM) 1 g in sodium chloride 0.9 % 100 mL IVPB        1 g 200 mL/hr over 30 Minutes Intravenous Every 8 hours 09/14/22 0720     09/13/22 0845  piperacillin-tazobactam (ZOSYN) IVPB 3.375 g        3.375 g 100 mL/hr over 30 Minutes Intravenous  Once 09/13/22 0842 09/13/22 1241   09/13/22 0830  cefTRIAXone (ROCEPHIN) 2 g in sodium chloride 0.9 % 100 mL IVPB  Status:  Discontinued        2 g 200 mL/hr over 30 Minutes Intravenous  Once 09/13/22 2841 09/13/22 1157       Subjective: Patient was seen and examined today.  Continues to have lower abdominal and bilateral flank pain.  Objective: Vitals:   09/14/22 0509 09/14/22 1912 09/15/22 0515 09/15/22 0742  BP: (!) 123/59 121/60 (!) 101/53 129/64  Pulse: 80 65 (!) 59 60  Resp: 19 18 17 16   Temp: 98.3 F (36.8  C) 98.5 F (36.9 C) 97.9 F (36.6 C) 97.9 F (36.6 C)  TempSrc: Oral Oral Oral Oral  SpO2: 99% 97% 95% 97%  Weight:      Height:        Intake/Output Summary (Last 24 hours) at 09/15/2022 1509 Last data filed at 09/15/2022 1435 Gross per 24 hour  Intake 100 ml  Output --  Net 100 ml    Filed Weights   09/13/22 0515  Weight: 99.8 kg    Examination:  General.  Frail elderly lady, in no acute distress. Pulmonary.  Lungs clear bilaterally, normal respiratory effort. CV.  Regular rate and rhythm, no JVD, rub or murmur. Abdomen.  Soft, mild lower abdominal and flank tenderness, nondistended, BS positive. CNS.  Alert and oriented .  No focal neurologic deficit. Extremities.  No edema, no cyanosis, pulses intact and symmetrical. Psychiatry.  Judgment and insight appears normal.    Data Reviewed: I have personally reviewed following labs and imaging studies  CBC: Recent Labs  Lab 09/13/22 0523 09/14/22 0738 09/15/22 0543  WBC 14.0* 11.7* 8.3  HGB 12.3 12.3 12.9  HCT 38.2 39.3 41.6  MCV 95.5 98.0 99.5  PLT 166 174 190    Basic Metabolic Panel: Recent Labs  Lab 09/13/22 0523 09/14/22 0738  NA 132* 137  K 4.0 3.8  CL 103 107  CO2 19* 23  GLUCOSE 117* 100*  BUN 17 14  CREATININE 0.99 0.99  CALCIUM 8.7* 8.6*      Recent Results (from the past 240 hour(s))  Urine Culture     Status: Abnormal   Collection Time: 09/13/22  6:44 AM   Specimen: Urine, Clean Catch  Result Value Ref Range Status   Specimen Description   Final    URINE, CLEAN CATCH Performed at [facility], [address] [place], [place] [address]    Special Requests   Final    NONE Performed at [facility], [address] [place], [place] [address]    Culture (A)  Final    80,000 COLONIES/mL ESCHERICHIA COLI Confirmed Extended Spectrum Beta-Lactamase Producer (ESBL).  In bloodstream infections from ESBL organisms, carbapenems are preferred over  piperacillin/tazobactam. They are shown to have a lower risk of mortality.    Report Status 09/15/2022 FINAL  Final   Organism ID, Bacteria ESCHERICHIA COLI (A)  Final      Susceptibility   Escherichia coli - MIC*    AMPICILLIN >=32 RESISTANT Resistant     CEFAZOLIN >=64 RESISTANT Resistant     CEFEPIME >=32 RESISTANT Resistant     CEFTRIAXONE >=64 RESISTANT Resistant     CIPROFLOXACIN >=4 RESISTANT Resistant     GENTAMICIN <=1 SENSITIVE Sensitive     IMIPENEM <=0.25 SENSITIVE Sensitive     NITROFURANTOIN <=16 SENSITIVE Sensitive     TRIMETH/SULFA <=20 SENSITIVE Sensitive     AMPICILLIN/SULBACTAM >=32 RESISTANT Resistant     PIP/TAZO 16 SENSITIVE Sensitive     * 80,000 COLONIES/mL ESCHERICHIA COLI  Blood culture (routine x 2)     Status: None (Preliminary result)   Collection Time: 09/13/22 12:39  PM   Specimen: BLOOD RIGHT HAND  Result Value Ref Range Status   Specimen Description BLOOD RIGHT HAND  Final   Special Requests   Final    BOTTLES DRAWN AEROBIC AND ANAEROBIC Blood Culture adequate volume   Culture   Final    NO GROWTH 2 DAYS Performed at [facility], [address] [place], [place] [address]    Report Status PENDING  Incomplete  Blood culture (routine x 2)     Status: None (Preliminary result)   Collection Time: 09/13/22 12:39 PM   Specimen: BLOOD LEFT ARM  Result Value Ref Range Status   Specimen Description BLOOD LEFT ARM  Final   Special Requests   Final    BOTTLES DRAWN AEROBIC AND ANAEROBIC Blood Culture adequate volume   Culture   Final    NO GROWTH 2 DAYS Performed at [facility], [address], [place], [place] [address]    Report Status PENDING  Incomplete     Radiology Studies: No results found.  Scheduled Meds:  busPIRone  15 mg Oral BID   enoxaparin (LOVENOX) injection  0.5 mg/kg Subcutaneous Q24H   gabapentin  600 mg Oral TID   levothyroxine  75 mcg Oral QAC breakfast   metoprolol succinate  25 mg Oral Daily    mirabegron ER  25 mg Oral Daily   pantoprazole  20 mg Oral Daily   Continuous Infusions:  meropenem (MERREM) IV 1 g (09/15/22 1401)     LOS: 2 days   Arnetha Courser, MD [facility] P5/03/2023, 3:09 PM

## 2022-09-16 DIAGNOSIS — N12 Tubulo-interstitial nephritis, not specified as acute or chronic: Secondary | ICD-10-CM | POA: Diagnosis not present

## 2022-09-16 DIAGNOSIS — E039 Hypothyroidism, unspecified: Secondary | ICD-10-CM | POA: Diagnosis not present

## 2022-09-16 DIAGNOSIS — J449 Chronic obstructive pulmonary disease, unspecified: Secondary | ICD-10-CM | POA: Diagnosis not present

## 2022-09-16 DIAGNOSIS — Z8619 Personal history of other infectious and parasitic diseases: Secondary | ICD-10-CM | POA: Diagnosis not present

## 2022-09-16 LAB — CREATININE, SERUM
Creatinine, Ser: 0.86 mg/dL (ref 0.44–1.00)
GFR, Estimated: >60 mL/min (ref >60)

## 2022-09-16 LAB — CULTURE, BLOOD (ROUTINE X 2)

## 2022-09-16 MED ORDER — KETOROLAC TROMETHAMINE 30 MG/ML IJ SOLN
INTRAMUSCULAR | Status: AC
  Administered 2022-09-16: 30 mg via INTRAVENOUS
  Filled 2022-09-16: qty 1

## 2022-09-16 MED ORDER — KETOROLAC TROMETHAMINE 30 MG/ML IJ SOLN: 30.0000 mg | Freq: Once | INTRAMUSCULAR | Status: AC

## 2022-09-16 MED ORDER — SODIUM CHLORIDE 0.9 % IV SOLN: INTRAVENOUS | Status: DC | PRN

## 2022-09-16 MED ORDER — KETOROLAC TROMETHAMINE 15 MG/ML IJ SOLN
15.0000 mg | Freq: Four times a day (QID) | INTRAMUSCULAR | Status: DC | PRN
  Administered 2022-09-16 – 2022-09-18 (×5): 15 mg via INTRAVENOUS
  Filled 2022-09-16 (×5): qty 1

## 2022-09-16 NOTE — Progress Notes (Signed)
PROGRESS NOTE  Shelly Freeman  ZOX:[id number] DOB: 01-01-1951 DOA: 09/13/2022 PCP: Unice Bailey, MD   Brief Narrative: Patient is a 72 year old female with history of COPD, GERD, nephrolithiasis hypothyroidism, lupus, depression, chronic pain syndrome who presented with complaint of right flank pain about 2 to 3 days, generalized body ache.  Patient was recently admitted on March and was managed for sepsis secondary to UTI, AKI.  Urine culture at that time showed ESBL E. coli.  On presentation she was afebrile, hemodynamically stable.  Lab work showed WC count of 14, UA was suspicious of UTI.  CT chest/abdomen/pelvis showed right-sided hydroureteronephrosis as well as cystitis.  Patient started on antibiotic.    5/11: Vital stable.  Continue to have lower abdominal and flank pain.  Urine cultures again with ESBL E. coli.  PT is recommending SNF but patient wants to go home, need to clarify from [facility] whether she can get home health while staying on [facility].  She will not be able to return until Monday as [facility] has to pick her up.  5/12: Hemodynamically stable, continue to have pain-Toradol worked better than Dilaudid so it was discontinued. Message sent to ID for their recommendations in terms of duration and type of antibiotic due to recurrent ESBL infection.  They will see her tomorrow.  Assessment & Plan:  Principal Problem:   Pyelonephritis Active Problems:   History of ESBL E. coli infection   COPD (chronic obstructive pulmonary disease) (HCC)   Hypothyroidism   Essential hypertension   GERD (gastroesophageal reflux disease)   Depression with anxiety   Primary osteoarthritis involving multiple joints   Cystitis  Right-sided flank pain/UTI: Presented with right-sided flank pain. CT imaging showed right-sided hydroureteronephrosis without obvious cause, suspected to be from recently passed calculus.  No evidence of pyelonephritis as per CT.  Asymmetric right sided bladder wall  thickening suggesting cystitis. Case was discussed with urology this morning, Dr. Vanna Scotland.  She recommended follow-up with urology as an outpatient and she will arrange it  continue current antibiotics.  History of ESBL E. coli infection so currently on meropenem.  Repeat urine culture remain the same blood cultures have not shown any growth.  -Continue with meropenem -Follow-up ID recommendations for discharge antibiotics tomorrow  History of COPD/pulmonary hypertension: As seen on the CT.  We recommend outpatient follow-up with pulmonology.  CT also showed patchy groundglass opacity in both lungs.  Patient is asymptomatic and is saturating  fine on room air.  Continue home regimen  Hypothyroidism: Continue Synthyroid  Hypertension: Currently blood stable.  Continue home medications  Chronic pain syndrome/osteoarthritis of multiple joints: Continue supportive care, pain management.  Patient has chronic pain syndrome and always complains of back pain, generalized pain  Depression/anxiety: On BuSpar, trazodone  GERD: Continue PPI  Obesity: BMI of 41.5  DVT prophylaxis:Lovenox     Code Status: Prior  Family Communication: Tried calling granddaughter with no response.  Patient status:Inpatient  Patient is from :home  Anticipated discharge WJ:XBJY  Estimated DC date:1-2 days   Consultants: Infectious disease  Procedures:None  Antimicrobials:  Anti-infectives (From admission, onward)    Start     Dose/Rate Route Frequency Ordered Stop   09/14/22 0815  meropenem (MERREM) 1 g in sodium chloride 0.9 % 100 mL IVPB        1 g 200 mL/hr over 30 Minutes Intravenous Every 8 hours 09/14/22 0720     09/13/22 0845  piperacillin-tazobactam (ZOSYN) IVPB 3.375 g  3.375 g 100 mL/hr over 30 Minutes Intravenous  Once 09/13/22 0842 09/13/22 1241   09/13/22 0830  cefTRIAXone (ROCEPHIN) 2 g in sodium chloride 0.9 % 100 mL IVPB  Status:  Discontinued        2 g 200 mL/hr over  30 Minutes Intravenous  Once 09/13/22 1610 09/13/22 1157       Subjective: Patient was seen and examined today.  She was crying with pain stating that Dilaudid is not helping.  Objective: Vitals:   09/15/22 1537 09/15/22 2001 09/16/22 0319 09/16/22 0809  BP: 123/62 (!) 140/44 120/71 115/66  Pulse: 70 65 75 64  Resp: 16 20 20 18   Temp:  98.4 F (36.9 C) 97.7 F (36.5 C) 97.8 F (36.6 C)  TempSrc: Oral Oral Oral Oral  SpO2: 97% 99% 98% 97%  Weight:      Height:        Intake/Output Summary (Last 24 hours) at 09/16/2022 1322 Last data filed at 09/15/2022 1435 Gross per 24 hour  Intake 100 ml  Output --  Net 100 ml    Filed Weights   09/13/22 0515  Weight: 99.8 kg    Examination:  General.  Frail elderly lady, in no acute distress. Pulmonary.  Lungs clear bilaterally, normal respiratory effort. CV.  Regular rate and rhythm, no JVD, rub or murmur. Abdomen.  Soft, nontender, nondistended, BS positive. CNS.  Alert and oriented .  No focal neurologic deficit. Extremities.  No edema, no cyanosis, pulses intact and symmetrical. Psychiatry.  Judgment and insight appears normal. .    Data Reviewed: I have personally reviewed following labs and imaging studies  CBC: Recent Labs  Lab 09/13/22 0523 09/14/22 0738 09/15/22 0543  WBC 14.0* 11.7* 8.3  HGB 12.3 12.3 12.9  HCT 38.2 39.3 41.6  MCV 95.5 98.0 99.5  PLT 166 174 190    Basic Metabolic Panel: Recent Labs  Lab 09/13/22 0523 09/14/22 0738 09/16/22 0529  NA 132* 137  --   K 4.0 3.8  --   CL 103 107  --   CO2 19* 23  --   GLUCOSE 117* 100*  --   BUN 17 14  --   CREATININE 0.99 0.99 0.86  CALCIUM 8.7* 8.6*  --       Recent Results (from the past 240 hour(s))  Urine Culture     Status: Abnormal   Collection Time: 09/13/22  6:44 AM   Specimen: Urine, Clean Catch  Result Value Ref Range Status   Specimen Description   Final    URINE, CLEAN CATCH Performed at [facility], [address], [place], [place] [address]    Special Requests   Final    NONE Performed at [facility], [address] [place], [place] [address]    Culture (A)  Final    80,000 COLONIES/mL ESCHERICHIA COLI Confirmed Extended Spectrum Beta-Lactamase Producer (ESBL).  In bloodstream infections from ESBL organisms, carbapenems are preferred over piperacillin/tazobactam. They are shown to have a lower risk of mortality.    Report Status 09/15/2022 FINAL  Final   Organism ID, Bacteria ESCHERICHIA COLI (A)  Final      Susceptibility   Escherichia coli - MIC*    AMPICILLIN >=32 RESISTANT Resistant     CEFAZOLIN >=64 RESISTANT Resistant     CEFEPIME >=32 RESISTANT Resistant     CEFTRIAXONE >=64 RESISTANT Resistant     CIPROFLOXACIN >=4 RESISTANT Resistant     GENTAMICIN <=1 SENSITIVE Sensitive  IMIPENEM <=0.25 SENSITIVE Sensitive     NITROFURANTOIN <=16 SENSITIVE Sensitive     TRIMETH/SULFA <=20 SENSITIVE Sensitive     AMPICILLIN/SULBACTAM >=32 RESISTANT Resistant     PIP/TAZO 16 SENSITIVE Sensitive     * 80,000 COLONIES/mL ESCHERICHIA COLI  Blood culture (routine x 2)     Status: None (Preliminary result)   Collection Time: 09/13/22 12:39 PM   Specimen: BLOOD RIGHT HAND  Result Value Ref Range Status   Specimen Description BLOOD RIGHT HAND  Final   Special Requests   Final    BOTTLES DRAWN AEROBIC AND ANAEROBIC Blood Culture adequate volume   Culture   Final    NO GROWTH 3 DAYS Performed at [facility], [address] [place], [place] [address]    Report Status PENDING  Incomplete  Blood culture (routine x 2)     Status: None (Preliminary result)   Collection Time: 09/13/22 12:39 PM   Specimen: BLOOD LEFT ARM  Result Value Ref Range Status   Specimen Description BLOOD LEFT ARM  Final   Special Requests   Final    BOTTLES DRAWN AEROBIC AND ANAEROBIC Blood Culture adequate volume   Culture   Final    NO GROWTH 3 DAYS Performed at [facility], [address], [place], [place] [address]    Report Status PENDING  Incomplete  C Difficile Quick Screen w PCR reflex     Status: None   Collection Time: 09/15/22  3:25 PM   Specimen: STOOL  Result Value Ref Range Status   C Diff antigen NEGATIVE NEGATIVE Final   C Diff toxin NEGATIVE NEGATIVE Final   C Diff interpretation No C. difficile detected.  Final    Comment: Performed at [facility], [address] [place], [place] [address]  Gastrointestinal Panel by PCR , Stool     Status: None   Collection Time: 09/15/22  3:25 PM   Specimen: Stool  Result Value Ref Range Status   Campylobacter species NOT DETECTED NOT DETECTED Final   Plesimonas shigelloides NOT DETECTED NOT DETECTED Final   Salmonella species NOT DETECTED NOT DETECTED Final   Yersinia enterocolitica NOT DETECTED NOT DETECTED Final   Vibrio species NOT DETECTED NOT DETECTED Final   Vibrio cholerae NOT DETECTED NOT DETECTED Final   Enteroaggregative E coli (EAEC) NOT DETECTED NOT DETECTED Final   Enteropathogenic E coli (EPEC) NOT DETECTED NOT DETECTED Final   Enterotoxigenic E coli (ETEC) NOT DETECTED NOT DETECTED Final   Shiga like toxin producing E coli (STEC) NOT DETECTED NOT DETECTED Final   Shigella/Enteroinvasive E coli (EIEC) NOT DETECTED NOT DETECTED Final   Cryptosporidium NOT DETECTED NOT DETECTED Final   Cyclospora cayetanensis NOT DETECTED NOT DETECTED Final   Entamoeba histolytica NOT DETECTED NOT DETECTED Final   Giardia lamblia NOT DETECTED NOT DETECTED Final   Adenovirus F40/41 NOT DETECTED NOT DETECTED Final   Astrovirus NOT DETECTED NOT DETECTED Final   Norovirus GI/GII NOT DETECTED NOT DETECTED Final   Rotavirus A NOT DETECTED NOT DETECTED Final   Sapovirus (I, II, IV, and V) NOT DETECTED NOT DETECTED Final    Comment: Performed at [facility], [address] [place], [place] [address]     Radiology Studies: No results found.  Scheduled Meds:  busPIRone  15 mg Oral BID    enoxaparin (LOVENOX) injection  0.5 mg/kg Subcutaneous Q24H   gabapentin  600 mg Oral TID   levothyroxine  75 mcg Oral QAC breakfast  metoprolol succinate  25 mg Oral Daily   mirabegron ER  25 mg Oral Daily   pantoprazole  20 mg Oral Daily   Continuous Infusions:  meropenem (MERREM) IV 1 g (09/16/22 0544)     LOS: 3 days   Arnetha Courser, MD [facility] P5/04/2023, 1:22 PM

## 2022-09-17 DIAGNOSIS — N12 Tubulo-interstitial nephritis, not specified as acute or chronic: Secondary | ICD-10-CM

## 2022-09-17 DIAGNOSIS — E039 Hypothyroidism, unspecified: Secondary | ICD-10-CM | POA: Diagnosis not present

## 2022-09-17 DIAGNOSIS — B9620 Unspecified Escherichia coli [E. coli] as the cause of diseases classified elsewhere: Secondary | ICD-10-CM

## 2022-09-17 DIAGNOSIS — N390 Urinary tract infection, site not specified: Secondary | ICD-10-CM

## 2022-09-17 DIAGNOSIS — Z8619 Personal history of other infectious and parasitic diseases: Secondary | ICD-10-CM | POA: Diagnosis not present

## 2022-09-17 DIAGNOSIS — J449 Chronic obstructive pulmonary disease, unspecified: Secondary | ICD-10-CM | POA: Diagnosis not present

## 2022-09-17 LAB — CULTURE, BLOOD (ROUTINE X 2)
Culture: NO GROWTH
Special Requests: ADEQUATE

## 2022-09-17 NOTE — TOC Progression Note (Signed)
Transition of Care [facility]) - Progression Note    Patient Details  Name: Shelly Freeman MRN: [id number] Date of Birth: 05-04-1951  Transition of Care [facility]) CM/SW Contact  Margarito Liner, LCSW Phone Number: 09/17/2022, 10:15 AM  Clinical Narrative:  CSW received call from [name], RN with [facility]. She is aware of SNF recommendation. They will give patient PT and OT at their center and extra home care. They are still planning to transport patient if discharged today but need to pick her up before 2:00. MD is aware.   Expected Discharge Plan and Services                                               Social Determinants of Health (SDOH) Interventions SDOH Screenings   Food Insecurity: No Food Insecurity (09/13/2022)  Housing: Low Risk  (09/13/2022)  Transportation Needs: No Transportation Needs (09/13/2022)  Utilities: Not At Risk (09/13/2022)  Alcohol Screen: Low Risk  (12/09/2017)  Tobacco Use: Low Risk  (09/13/2022)    Readmission Risk Interventions    01/28/2021   12:19 PM  Readmission Risk Prevention Plan  Transportation Screening Complete  PCP or Specialist Appt within 3-5 Days Complete  HRI or Home Care Consult Complete  Social Work Consult for Recovery Care Planning/Counseling Complete  Palliative Care Screening Not Applicable  Medication Review Ocean[employer]) Complete

## 2022-09-17 NOTE — Consult Note (Addendum)
NAME: Shelly Freeman  DOB: 1951/01/18  MRN: [id number]  Date/Time: 09/17/2022 11:09 AM  REQUESTING PROVIDER: Dr.Amin Subjective:  REASON FOR CONSULT: esbl ecoli UTI ?pt is a vague historian Shelly Freeman is a 72 y.o. female with a history of SLE, collagen vascular disease, COPD, sleep apnea, recurrent UTI  presented with generalized body ache which is chronic and pain both flanks Rt > left Nausea, vomiting of 4 days duration She was in the hospital in March 2024 for similar complaints and UC had ESBL ecoli and CT abdomen showed mild dilatation of rt renal collecting system and possible uti. There was also a collection above the vaginal cuff- she saw gyn Dr.Beasley on 08/31/22 and a transvaginal ultrasound was planned but not done yet She saw Rheumatologist Dr.Patel on 09/06/22 and was given a tapering course of steroids and labs were sent. Anti DNA < 1   In the ED vitals were normal  09/13/22  BP 134/55 !  Temp 98.3 F (36.8 C)  Pulse Rate 80  Resp 18  SpO2 100 %    Latest Reference Range & Units 09/13/22  WBC 4.0 - 10.5 K/uL 14.0 (H)  Hemoglobin 12.0 - 15.0 g/dL 40.9  HCT 81.1 - 91.4 % 38.2  Platelets 150 - 400 K/uL 166  Creatinine 0.44 - 1.00 mg/dL 7.82   Bc sent UA more than 50 wbc UC- ESBL ecoli She is currently on meropenem and I am asked to see her for antibiotic recommendation CT abdomen again showed rt mild hydronephrosis with no obvious stones Rt sided bladder wall thickening Past Medical History:  Diagnosis Date   Anginal pain (HCC)    Arthritis    Collagen vascular disease (HCC)    COPD (chronic obstructive pulmonary disease) (HCC)    DDD (degenerative disc disease), lumbar 05/14/2016   Depression    Dizziness    Dyspnea    GERD (gastroesophageal reflux disease)    Heart valve problem    per grandaughter need surgery but they told her may not survive;   History of kidney stones    Hypertension    Hypothyroidism    Lupus (HCC)    Neuromuscular disorder  (HCC)    Sleep apnea    no CPAP   Thyroid disease    Vitamin D deficiency 05/14/2016    Past Surgical History:  Procedure Laterality Date   APPENDECTOMY     BREAST BIOPSY Left    neg   CATARACT EXTRACTION W/PHACO Left 11/13/2021   Procedure: CATARACT EXTRACTION PHACO AND INTRAOCULAR LENS PLACEMENT (IOC) LEFT 3.63 00:35.9;  Surgeon: Nevada Crane, MD;  Location: [facility];  Service: Ophthalmology;  Laterality: Left;   CATARACT EXTRACTION W/PHACO Right 12/04/2021   Procedure: CATARACT EXTRACTION PHACO AND INTRAOCULAR LENS PLACEMENT (IOC) RIGHT;  Surgeon: Nevada Crane, MD;  Location: [facility];  Service: Ophthalmology;  Laterality: Right;  3.11 00:32.6   CESAREAN SECTION     x 2   COLONOSCOPY N/A 08/24/2021   Procedure: COLONOSCOPY;  Surgeon: Jaynie Collins, DO;  Location: [facility];  Service: Gastroenterology;  Laterality: N/A;  SPANISH INTERPRETER   COLONOSCOPY WITH PROPOFOL N/A 07/01/2017   Procedure: COLONOSCOPY WITH PROPOFOL;  Surgeon: Scot Jun, MD;  Location: [facility] END[facility];  Service: Endoscopy;  Laterality: N/A;   ESOPHAGOGASTRODUODENOSCOPY (EGD) WITH PROPOFOL N/A 07/01/2017   Procedure: ESOPHAGOGASTRODUODENOSCOPY (EGD) WITH PROPOFOL;  Surgeon: Scot Jun, MD;  Location: [facility] END[facility]COPY;  Service: Endoscopy;  Laterality: N/A;   GASTRIC BYPASS  LIPOMA EXCISION Left 06/14/2022   Procedure: EXCISION LIPOMA, left chest wall;  Surgeon: Leafy Ro, MD;  Location: [facility]S;  Service: General;  Laterality: Left;   LITHOTRIPSY     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   Procedure: TOTAL KNEE ARTHROPLASTY;  Surgeon: Kennedy Bucker, MD;  Location: [facility];  Service: Orthopedics;  Laterality: Left;   TUBAL LIGATION      Social History   Socioeconomic History   Marital status: Divorced    Spouse name: Not on file   Number of children: Not on file   Years of education: Not on file   Highest education level: Not on file   Occupational History   Not on file  Tobacco Use   Smoking status: Never   Smokeless tobacco: Never  Vaping Use   Vaping Use: Never used  Substance and Sexual Activity   Alcohol use: No   Drug use: No   Sexual activity: Not Currently    Birth control/protection: Abstinence, Post-menopausal  Other Topics Concern   Not on file  Social History Narrative   Lives alone   Social Determinants of Health   Financial Resource Strain: Not on file  Food Insecurity: No Food Insecurity (09/13/2022)   Hunger Vital Sign    Worried About Running Out of Food in the Last Year: Never true    Ran Out of Food in the Last Year: Never true  Transportation Needs: No Transportation Needs (09/13/2022)   PRAPARE - Administrator, Civil Service (Medical): No    Lack of Transportation (Non-Medical): No  Physical Activity: Not on file  Stress: Not on file  Social Connections: Not on file  Intimate Partner Violence: Not At Risk (09/13/2022)   Humiliation, Afraid, Rape, and Kick questionnaire    Fear of Current or Ex-Partner: No    Emotionally Abused: No    Physically Abused: No    Sexually Abused: No    Family History  Problem Relation Age of Onset   Breast cancer Maternal Aunt    Breast cancer Cousin    Allergies  Allergen Reactions   Cholestyramine Other (See Comments)    severe   I? Current Facility-Administered Medications  Medication Dose Route Frequency Provider Last Rate Last Admin   0.9 %  sodium chloride infusion   Intravenous PRN Arnetha Courser, MD 10 mL/hr at 09/17/22 0507 New Bag at 09/17/22 0507   acetaminophen (TYLENOL) tablet 650 mg  650 mg Oral Q6H PRN Floydene Flock, MD   650 mg at 09/16/22 1613   busPIRone (BUSPAR) tablet 15 mg  15 mg Oral BID Floydene Flock, MD   15 mg at 09/17/22 0823   enoxaparin (LOVENOX) injection 50 mg  0.5 mg/kg Subcutaneous Q24H Burnadette Pop, MD   50 mg at 09/16/22 1051   gabapentin (NEURONTIN) capsule 600 mg  600 mg Oral TID Floydene Flock, MD   600 mg at 09/17/22 0823   ketorolac (TORADOL) 15 MG/ML injection 15 mg  15 mg Intravenous Q6H PRN Arnetha Courser, MD   15 mg at 09/17/22 0140   levothyroxine (SYNTHROID) tablet 75 mcg  75 mcg Oral QAC breakfast Floydene Flock, MD   75 mcg at 09/17/22 0508   meropenem (MERREM) 1 g in sodium chloride 0.9 % 100 mL IVPB  1 g Intravenous Q8H Adhikari, Amrit, MD 200 mL/hr at 09/17/22 0507 1 g at 09/17/22 0507   metoprolol succinate (TOPROL-XL) 24 hr tablet 25 mg  25 mg Oral  Daily Floydene Flock, MD   25 mg at 09/17/22 1610   mirabegron ER (MYRBETRIQ) tablet 25 mg  25 mg Oral Daily Floydene Flock, MD   25 mg at 09/17/22 0824   ondansetron [facility]) injection 4 mg  4 mg Intravenous Q6H PRN Arnetha Courser, MD   4 mg at 09/16/22 1917   pantoprazole (PROTONIX) EC tablet 20 mg  20 mg Oral Daily Floydene Flock, MD   20 mg at 09/17/22 9604   traMADol (ULTRAM) tablet 50 mg  50 mg Oral TID PRN Floydene Flock, MD   50 mg at 09/16/22 2146   traZODone (DESYREL) tablet 25 mg  25 mg Oral QHS PRN Floydene Flock, MD   25 mg at 09/16/22 2146     Abtx:  Anti-infectives (From admission, onward)    Start     Dose/Rate Route Frequency Ordered Stop   09/14/22 0815  meropenem (MERREM) 1 g in sodium chloride 0.9 % 100 mL IVPB        1 g 200 mL/hr over 30 Minutes Intravenous Every 8 hours 09/14/22 0720     09/13/22 0845  piperacillin-tazobactam (ZOSYN) IVPB 3.375 g        3.375 g 100 mL/hr over 30 Minutes Intravenous  Once 09/13/22 0842 09/13/22 1241   09/13/22 0830  cefTRIAXone (ROCEPHIN) 2 g in sodium chloride 0.9 % 100 mL IVPB  Status:  Discontinued        2 g 200 mL/hr over 30 Minutes Intravenous  Once 09/13/22 5409 09/13/22 1157       REVIEW OF SYSTEMS:  Const: negative fever, negative chills, negative weight loss Eyes: negative diplopia or visual changes, negative eye pain ENT: negative coryza, negative sore throat Resp: negative cough, hemoptysis, dyspnea Cards: negative for chest  pain, palpitations, lower extremity edema GU: negative for frequency, dysuria and hematuria GI: rt upper quadrant abdominal pain, diarrhea,nausea Skin: negative for rash and pruritus Heme: negative for easy bruising and gum/nose bleeding MS: pain is inconsistent- she said first her whole back hutrt and then rt side, and left and now back to right Neurolo:negative for headaches, dizziness, vertigo, memory problems  Psych: anxiety, depression  Endocrine: negative for  diabetes Allergy/Immunology- cholestyramine ?  Objective:  VITALS:  BP (!) 127/53 (BP Location: Right Arm)   Pulse (!) 58   Temp 98.2 F (36.8 C) (Oral)   Resp 15   Ht 5\' 1"  (1.549 m)   Wt 99.8 kg   SpO2 97%   BMI 41.59 kg/m   PHYSICAL EXAM:  General: Alert, cooperative, some distress, appears stated age.  Head: Normocephalic, without obvious abnormality, atraumatic. Eyes: Conjunctivae clear, anicteric sclerae. Pupils are equal ENT Nares normal. No drainage or sinus tenderness. Lips, mucosa, and tongue normal. No Thrush Neck: Supple, symmetrical, no adenopathy, thyroid: non tender no carotid bruit and no JVD. Back: No CVA tenderness. Lungs: Clear to auscultation bilaterally. No Wheezing or Rhonchi. No rales. Heart: Regular rate and rhythm, no murmur, rub or gallop. Abdomen: Soft, non-tender,not distended. Bowel sounds normal. No masses Extremities: atraumatic, no cyanosis. No edema. No clubbing Skin: No rashes or lesions. Or bruising Lymph: Cervical, supraclavicular normal. Neurologic: Grossly non-focal Pertinent Labs Lab Results CBC    Component Value Date/Time   WBC 8.3 09/15/2022 0543   RBC 4.18 09/15/2022 0543   HGB 12.9 09/15/2022 0543   HCT 41.6 09/15/2022 0543   PLT 190 09/15/2022 0543   MCV 99.5 09/15/2022 0543   MCH 30.9 09/15/2022 0543  MCHC 31.0 09/15/2022 0543   RDW 13.4 09/15/2022 0543   LYMPHSABS 1.3 07/24/2022 0438   MONOABS 0.6 07/24/2022 0438   EOSABS 0.1 07/24/2022 0438    BASOSABS 0.0 07/24/2022 0438       Latest Ref Rng & Units 09/16/2022    5:29 AM 09/14/2022    7:38 AM 09/13/2022    5:23 AM  CMP  Glucose 70 - 99 mg/dL  102  725   BUN 8 - 23 mg/dL  14  17   Creatinine 3.66 - 1.00 mg/dL 4.40  3.47  4.25   Sodium 135 - 145 mmol/L  137  132   Potassium 3.5 - 5.1 mmol/L  3.8  4.0   Chloride 98 - 111 mmol/L  107  103   CO2 22 - 32 mmol/L  23  19   Calcium 8.9 - 10.3 mg/dL  8.6  8.7   Total Protein 6.5 - 8.1 g/dL   6.3   Total Bilirubin 0.3 - 1.2 mg/dL   0.7   Alkaline Phos 38 - 126 U/L   89   AST 15 - 41 U/L   20   ALT 0 - 44 U/L   12       Microbiology: Recent Results (from the past 240 hour(s))  Urine Culture     Status: Abnormal   Collection Time: 09/13/22  6:44 AM   Specimen: Urine, Clean Catch  Result Value Ref Range Status   Specimen Description   Final    URINE, CLEAN CATCH Performed at [facility], [address], [place], [place] [address]    Special Requests   Final    NONE Performed at [facility], [address] [place], [place] [address]    Culture (A)  Final    80,000 COLONIES/mL ESCHERICHIA COLI Confirmed Extended Spectrum Beta-Lactamase Producer (ESBL).  In bloodstream infections from ESBL organisms, carbapenems are preferred over piperacillin/tazobactam. They are shown to have a lower risk of mortality.    Report Status 09/15/2022 FINAL  Final   Organism ID, Bacteria ESCHERICHIA COLI (A)  Final      Susceptibility   Escherichia coli - MIC*    AMPICILLIN >=32 RESISTANT Resistant     CEFAZOLIN >=64 RESISTANT Resistant     CEFEPIME >=32 RESISTANT Resistant     CEFTRIAXONE >=64 RESISTANT Resistant     CIPROFLOXACIN >=4 RESISTANT Resistant     GENTAMICIN <=1 SENSITIVE Sensitive     IMIPENEM <=0.25 SENSITIVE Sensitive     NITROFURANTOIN <=16 SENSITIVE Sensitive     TRIMETH/SULFA <=20 SENSITIVE Sensitive     AMPICILLIN/SULBACTAM >=32 RESISTANT Resistant     PIP/TAZO 16 SENSITIVE Sensitive     *  80,000 COLONIES/mL ESCHERICHIA COLI  Blood culture (routine x 2)     Status: None (Preliminary result)   Collection Time: 09/13/22 12:39 PM   Specimen: BLOOD RIGHT HAND  Result Value Ref Range Status   Specimen Description BLOOD RIGHT HAND  Final   Special Requests   Final    BOTTLES DRAWN AEROBIC AND ANAEROBIC Blood Culture adequate volume   Culture   Final    NO GROWTH 4 DAYS Performed at [facility], [address] [place], [place] [address]    Report Status PENDING  Incomplete  Blood culture (routine x 2)     Status: None (Preliminary result)   Collection Time: 09/13/22 12:39 PM   Specimen: BLOOD LEFT ARM  Result Value Ref Range Status   Specimen Description  BLOOD LEFT ARM  Final   Special Requests   Final    BOTTLES DRAWN AEROBIC AND ANAEROBIC Blood Culture adequate volume   Culture   Final    NO GROWTH 4 DAYS Performed at [facility], [address] [place], [place] [address]    Report Status PENDING  Incomplete  C Difficile Quick Screen w PCR reflex     Status: None   Collection Time: 09/15/22  3:25 PM   Specimen: STOOL  Result Value Ref Range Status   C Diff antigen NEGATIVE NEGATIVE Final   C Diff toxin NEGATIVE NEGATIVE Final   C Diff interpretation No C. difficile detected.  Final    Comment: Performed at [facility], [address] [place], [place] [address]  Gastrointestinal Panel by PCR , Stool     Status: None   Collection Time: 09/15/22  3:25 PM   Specimen: Stool  Result Value Ref Range Status   Campylobacter species NOT DETECTED NOT DETECTED Final   Plesimonas shigelloides NOT DETECTED NOT DETECTED Final   Salmonella species NOT DETECTED NOT DETECTED Final   Yersinia enterocolitica NOT DETECTED NOT DETECTED Final   Vibrio species NOT DETECTED NOT DETECTED Final   Vibrio cholerae NOT DETECTED NOT DETECTED Final   Enteroaggregative E coli (EAEC) NOT DETECTED NOT DETECTED Final   Enteropathogenic E coli (EPEC) NOT DETECTED  NOT DETECTED Final   Enterotoxigenic E coli (ETEC) NOT DETECTED NOT DETECTED Final   Shiga like toxin producing E coli (STEC) NOT DETECTED NOT DETECTED Final   Shigella/Enteroinvasive E coli (EIEC) NOT DETECTED NOT DETECTED Final   Cryptosporidium NOT DETECTED NOT DETECTED Final   Cyclospora cayetanensis NOT DETECTED NOT DETECTED Final   Entamoeba histolytica NOT DETECTED NOT DETECTED Final   Giardia lamblia NOT DETECTED NOT DETECTED Final   Adenovirus F40/41 NOT DETECTED NOT DETECTED Final   Astrovirus NOT DETECTED NOT DETECTED Final   Norovirus GI/GII NOT DETECTED NOT DETECTED Final   Rotavirus A NOT DETECTED NOT DETECTED Final   Sapovirus (I, II, IV, and V) NOT DETECTED NOT DETECTED Final    Comment: Performed at [facility], [address] [place], [place] [address]    IMAGING RESULTS: I have personally reviewed the films ? Impression/Recommendation ? Pt presenting with whole body ache especially back And then pain moved to rt flank She also has nausea and vomiting 2 episodes of watery stool today No fever or leucocytosis  ESBL E. coli complicated UTI- rt pyelonephritis recurrent The CT abdomen and pelvis shows rt mild hydronephrosis Get bladder scan to look for residue Urology consult Treat with 2 weeks of antibiotics- can change meropenem to bactrim DS I BID until 09/27/22. While on bactrim meed Cbc/CMP in 7 days . Need to closely watch for hyperkalemia or AKI     Diarrhea- today- no leucocytosis/no fever, no suspicion for cdiff currently but monitor closely  Lupus currently not on any medication.  Followed by rheumatologist at [facility] clinic.recent short course of steroids   History of left TKA   Anxiety/depression on multiple medication   Hypothyroidism on Synthroid.  Follow up with gyn for Transvag ultrasound ? ___________________________________________________ Discussed with patient, requesting provider Note:  This document was prepared using  Dragon voice recognition software and may include unintentional dictation errors.

## 2022-09-17 NOTE — Care Management Important Message (Signed)
Important Message  Patient Details  Name: Shelly Freeman MRN: [id number] Date of Birth: 1950/09/28   Medicare Important Message Given:  N/A - LOS <3 / Initial given by admissions     Olegario Messier A Marthe Dant 09/17/2022, 9:24 AM

## 2022-09-17 NOTE — Progress Notes (Signed)
PROGRESS NOTE  Shelly Freeman  ZOX:[id number] DOB: [birth year] DOA: 09/13/2022 PCP: Unice Bailey, MD   Brief Narrative: Patient is a 72 year old female with history of COPD, GERD, nephrolithiasis hypothyroidism, lupus, depression, chronic pain syndrome who presented with complaint of right flank pain about 2 to 3 days, generalized body ache.  Patient was recently admitted on March and was managed for sepsis secondary to UTI, AKI.  Urine culture at that time showed ESBL E. coli.  On presentation she was afebrile, hemodynamically stable.  Lab work showed WC count of 14, UA was suspicious of UTI.  CT chest/abdomen/pelvis showed right-sided hydroureteronephrosis as well as cystitis.  Patient started on antibiotic.    5/11: Vital stable.  Continue to have lower abdominal and flank pain.  Urine cultures again with ESBL E. coli.  PT is recommending SNF but patient wants to go home, need to clarify from [facility] whether she can get home health while staying on [facility].  She will not be able to return until Monday as [facility] has to pick her up.  5/12: Hemodynamically stable, continue to have pain-Toradol worked better than Dilaudid so it was discontinued. Message sent to ID for their recommendations in terms of duration and type of antibiotic due to recurrent ESBL infection.  They will see her tomorrow.  5/13: Continue to have left flank pain but improved than before.  [facility] will arrange home health services and just want her to be discharged to them.  Pending ID consult for the type and duration of IV antibiotics.  If remains stable should be able to go back tomorrow morning.[facility] wants discharge before 11  Assessment & Plan:  Principal Problem:   Pyelonephritis Active Problems:   History of ESBL E. coli infection   COPD (chronic obstructive pulmonary disease) (HCC)   Hypothyroidism   Essential hypertension   GERD (gastroesophageal reflux disease)   Depression with anxiety   Primary  osteoarthritis involving multiple joints   Cystitis  Right-sided flank pain/UTI: Presented with right-sided flank pain. CT imaging showed right-sided hydroureteronephrosis without obvious cause, suspected to be from recently passed calculus.  No evidence of pyelonephritis as per CT.  Asymmetric right sided bladder wall thickening suggesting cystitis. Case was discussed with urology this morning, Dr. Vanna Scotland.  She recommended follow-up with urology as an outpatient and she will arrange it  continue current antibiotics.  History of ESBL E. coli infection so currently on meropenem.  Repeat urine culture remain the same blood cultures have not shown any growth.  -Continue with meropenem -Follow-up ID recommendations for discharge antibiotics tomorrow  History of COPD/pulmonary hypertension: As seen on the CT.  We recommend outpatient follow-up with pulmonology.  CT also showed patchy groundglass opacity in both lungs.  Patient is asymptomatic and is saturating  fine on room air.  Continue home regimen  Hypothyroidism: Continue Synthyroid  Hypertension: Currently blood stable.  Continue home medications  Chronic pain syndrome/osteoarthritis of multiple joints: Continue supportive care, pain management.  Patient has chronic pain syndrome and always complains of back pain, generalized pain  Depression/anxiety: On BuSpar, trazodone  GERD: Continue PPI  Obesity: BMI of 41.5  DVT prophylaxis:Lovenox     Code Status: Prior  Family Communication: Tried calling granddaughter with no response.  Patient status:Inpatient  Patient is from :home  Anticipated discharge WJ:XBJY  Estimated DC date:1-2 days   Consultants: Infectious disease  Procedures:None  Antimicrobials:  Anti-infectives (From admission, onward)    Start     Dose/Rate Route Frequency  Ordered Stop   09/14/22 0815  meropenem (MERREM) 1 g in sodium chloride 0.9 % 100 mL IVPB        1 g 200 mL/hr over 30 Minutes  Intravenous Every 8 hours 09/14/22 0720     09/13/22 0845  piperacillin-tazobactam (ZOSYN) IVPB 3.375 g        3.375 g 100 mL/hr over 30 Minutes Intravenous  Once 09/13/22 0842 09/13/22 1241   09/13/22 0830  cefTRIAXone (ROCEPHIN) 2 g in sodium chloride 0.9 % 100 mL IVPB  Status:  Discontinued        2 g 200 mL/hr over 30 Minutes Intravenous  Once 09/13/22 0822 09/13/22 1157       Subjective: Patient with improved pain, although continue to have left flank pain.  Toradol works better than Dilaudid.  Objective: Vitals:   09/16/22 1533 09/16/22 1909 09/17/22 0339 09/17/22 0800  BP: 135/63 131/66 (!) 117/51 (!) 127/53  Pulse: 66 82 (!) 54 (!) 58  Resp: 20 20 16 15   Temp: 98 F (36.7 C) 97.6 F (36.4 C) 97.8 F (36.6 C) 98.2 F (36.8 C)  TempSrc: Oral Oral Oral Oral  SpO2: 96% 100% 98% 97%  Weight:      Height:        Intake/Output Summary (Last 24 hours) at 09/17/2022 1421 Last data filed at 09/17/2022 1317 Gross per 24 hour  Intake 345.7 ml  Output --  Net 345.7 ml    Filed Weights   09/13/22 0515  Weight: 99.8 kg    Examination:  General.  Obese lady, in no acute distress. Pulmonary.  Lungs clear bilaterally, normal respiratory effort. CV.  Regular rate and rhythm, no JVD, rub or murmur. Abdomen.  Soft, nontender, nondistended, BS positive. CNS.  Alert and oriented .  No focal neurologic deficit. Extremities.  No edema, no cyanosis, pulses intact and symmetrical. Psychiatry.  Judgment and insight appears normal.  .    Data Reviewed: I have personally reviewed following labs and imaging studies  CBC: Recent Labs  Lab 09/13/22 0523 09/14/22 0738 09/15/22 0543  WBC 14.0* 11.7* 8.3  HGB 12.3 12.3 12.9  HCT 38.2 39.3 41.6  MCV 95.5 98.0 99.5  PLT 166 174 190    Basic Metabolic Panel: Recent Labs  Lab 09/13/22 0523 09/14/22 0738 09/16/22 0529  NA 132* 137  --   K 4.0 3.8  --   CL 103 107  --   CO2 19* 23  --   GLUCOSE 117* 100*  --   BUN 17 14   --   CREATININE 0.99 0.99 0.86  CALCIUM 8.7* 8.6*  --       Recent Results (from the past 240 hour(s))  Urine Culture     Status: Abnormal   Collection Time: 09/13/22  6:44 AM   Specimen: Urine, Clean Catch  Result Value Ref Range Status   Specimen Description   Final    URINE, CLEAN CATCH Performed at [facility], [address] [place], [place] [address]    Special Requests   Final    NONE Performed at [facility], [address] [place], [place] [address]    Culture (A)  Final    80,000 COLONIES/mL ESCHERICHIA COLI Confirmed Extended Spectrum Beta-Lactamase Producer (ESBL).  In bloodstream infections from ESBL organisms, carbapenems are preferred over piperacillin/tazobactam. They are shown to have a lower risk of mortality.    Report Status 09/15/2022 FINAL  Final   Organism ID, Bacteria ESCHERICHIA COLI (  A)  Final      Susceptibility   Escherichia coli - MIC*    AMPICILLIN >=32 RESISTANT Resistant     CEFAZOLIN >=64 RESISTANT Resistant     CEFEPIME >=32 RESISTANT Resistant     CEFTRIAXONE >=64 RESISTANT Resistant     CIPROFLOXACIN >=4 RESISTANT Resistant     GENTAMICIN <=1 SENSITIVE Sensitive     IMIPENEM <=0.25 SENSITIVE Sensitive     NITROFURANTOIN <=16 SENSITIVE Sensitive     TRIMETH/SULFA <=20 SENSITIVE Sensitive     AMPICILLIN/SULBACTAM >=32 RESISTANT Resistant     PIP/TAZO 16 SENSITIVE Sensitive     * 80,000 COLONIES/mL ESCHERICHIA COLI  Blood culture (routine x 2)     Status: None (Preliminary result)   Collection Time: 09/13/22 12:39 PM   Specimen: BLOOD RIGHT HAND  Result Value Ref Range Status   Specimen Description BLOOD RIGHT HAND  Final   Special Requests   Final    BOTTLES DRAWN AEROBIC AND ANAEROBIC Blood Culture adequate volume   Culture   Final    NO GROWTH 4 DAYS Performed at [facility], [address] [place], [place] [address]    Report Status PENDING  Incomplete  Blood culture (routine x 2)     Status:  None (Preliminary result)   Collection Time: 09/13/22 12:39 PM   Specimen: BLOOD LEFT ARM  Result Value Ref Range Status   Specimen Description BLOOD LEFT ARM  Final   Special Requests   Final    BOTTLES DRAWN AEROBIC AND ANAEROBIC Blood Culture adequate volume   Culture   Final    NO GROWTH 4 DAYS Performed at [facility], [address], [place], [place] [address]    Report Status PENDING  Incomplete  C Difficile Quick Screen w PCR reflex     Status: None   Collection Time: 09/15/22  3:25 PM   Specimen: STOOL  Result Value Ref Range Status   C Diff antigen NEGATIVE NEGATIVE Final   C Diff toxin NEGATIVE NEGATIVE Final   C Diff interpretation No C. difficile detected.  Final    Comment: Performed at [facility], [address] [place], [place] [address]  Gastrointestinal Panel by PCR , Stool     Status: None   Collection Time: 09/15/22  3:25 PM   Specimen: Stool  Result Value Ref Range Status   Campylobacter species NOT DETECTED NOT DETECTED Final   Plesimonas shigelloides NOT DETECTED NOT DETECTED Final   Salmonella species NOT DETECTED NOT DETECTED Final   Yersinia enterocolitica NOT DETECTED NOT DETECTED Final   Vibrio species NOT DETECTED NOT DETECTED Final   Vibrio cholerae NOT DETECTED NOT DETECTED Final   Enteroaggregative E coli (EAEC) NOT DETECTED NOT DETECTED Final   Enteropathogenic E coli (EPEC) NOT DETECTED NOT DETECTED Final   Enterotoxigenic E coli (ETEC) NOT DETECTED NOT DETECTED Final   Shiga like toxin producing E coli (STEC) NOT DETECTED NOT DETECTED Final   Shigella/Enteroinvasive E coli (EIEC) NOT DETECTED NOT DETECTED Final   Cryptosporidium NOT DETECTED NOT DETECTED Final   Cyclospora cayetanensis NOT DETECTED NOT DETECTED Final   Entamoeba histolytica NOT DETECTED NOT DETECTED Final   Giardia lamblia NOT DETECTED NOT DETECTED Final   Adenovirus F40/41 NOT DETECTED NOT DETECTED Final   Astrovirus NOT DETECTED NOT DETECTED Final    Norovirus GI/GII NOT DETECTED NOT DETECTED Final   Rotavirus A NOT DETECTED NOT DETECTED Final   Sapovirus (I, II, IV, and V) NOT DETECTED NOT  DETECTED Final    Comment: Performed at [facility], [address], [place], [place] [address]     Radiology Studies: No results found.  Scheduled Meds:  busPIRone  15 mg Oral BID   enoxaparin (LOVENOX) injection  0.5 mg/kg Subcutaneous Q24H   gabapentin  600 mg Oral TID   levothyroxine  75 mcg Oral QAC breakfast   metoprolol succinate  25 mg Oral Daily   mirabegron ER  25 mg Oral Daily   pantoprazole  20 mg Oral Daily   Continuous Infusions:  sodium chloride Stopped (09/17/22 0601)   meropenem (MERREM) IV Stopped (09/17/22 0537)     LOS: 4 days   Arnetha Courser, MD [facility] P5/13/2024, 2:21 PM

## 2022-09-18 DIAGNOSIS — N12 Tubulo-interstitial nephritis, not specified as acute or chronic: Secondary | ICD-10-CM | POA: Diagnosis not present

## 2022-09-18 LAB — CULTURE, BLOOD (ROUTINE X 2): Special Requests: ADEQUATE

## 2022-09-18 MED ORDER — SULFAMETHOXAZOLE-TRIMETHOPRIM 800-160 MG PO TABS: 1.0000 | ORAL_TABLET | Freq: Two times a day (BID) | ORAL | 0 refills | Status: AC

## 2022-09-18 NOTE — Progress Notes (Signed)
Allison Rakers to be D/C'd Home per MD order.  Discussed prescriptions and follow up appointments with the patient. Prescriptions given to patient, medication list explained in detail. Pt verbalized understanding.  Allergies as of 09/18/2022       Reactions   Cholestyramine Other (See Comments)   severe        Medication List     TAKE these medications    acetaminophen 650 MG CR tablet Commonly known as: TYLENOL Take 650 mg by mouth 3 (three) times daily as needed for pain.   albuterol 108 (90 Base) MCG/ACT inhaler Commonly known as: VENTOLIN HFA Inhale into the lungs. Inhale 2 puffs into the lungs every 4 (four) hours as needed for wheezing or shortness of breath.   alendronate 70 MG tablet Commonly known as: FOSAMAX Take 70 mg by mouth once a week. Take with a full glass of water on an empty stomach.   busPIRone 15 MG tablet Commonly known as: BUSPAR Take 15 mg by mouth 2 (two) times daily.   calcium carbonate 750 MG chewable tablet Commonly known as: TUMS EX Chew 1 tablet by mouth 4 (four) times daily as needed for heartburn.   celecoxib 100 MG capsule Commonly known as: CELEBREX Take 1 capsule by mouth 2 (two) times daily.   cetirizine 10 MG tablet Commonly known as: ZYRTEC Take 10 mg by mouth daily.   cholecalciferol 25 MCG (1000 UNIT) tablet Commonly known as: VITAMIN D3 Take 1,000 Units by mouth daily.   cyanocobalamin 1000 MCG tablet Commonly known as: VITAMIN B12 Take 1,000 mcg by mouth daily.   diclofenac Sodium 1 % Gel Commonly known as: VOLTAREN Apply 2 g topically 4 (four) times daily.   escitalopram 20 MG tablet Commonly known as: LEXAPRO Take 20 mg by mouth daily.   estradiol 0.1 MG/GM vaginal cream Commonly known as: ESTRACE Place 0.5 g vaginally daily. Insert 1/2 gram into vagina every night as directed for two weeks then use twice weekly on Monday and Friday nights only   famotidine 20 MG tablet Commonly known as: PEPCID Take 20 mg  by mouth 2 (two) times daily.   ferrous sulfate 325 (65 FE) MG tablet Take 325 mg by mouth every Monday, Wednesday, and Friday.   fluticasone 50 MCG/ACT nasal spray Commonly known as: FLONASE Place into both nostrils daily.   folic acid 1 MG tablet Commonly known as: FOLVITE Take 1 mg by mouth daily.   gabapentin 600 MG tablet Commonly known as: NEURONTIN Take 600 mg by mouth 3 (three) times daily.   hydrocortisone cream 1 % Apply 1 application topically 3 (three) times daily as needed for itching.   ipratropium-albuterol 0.5-2.5 (3) MG/3ML Soln Commonly known as: DUONEB Take 3 mLs by nebulization every 6 (six) hours as needed (shortness of breath).   levothyroxine 75 MCG tablet Commonly known as: SYNTHROID Take 75 mcg by mouth daily before breakfast.   lidocaine 5 % Commonly known as: LIDODERM Place 1 patch onto the skin daily. Remove & Discard patch within 12 hours or as directed by MD   liver oil-zinc oxide 40 % ointment Commonly known as: DESITIN Apply 1 Application topically as needed for irritation.   Metamucil 0.36 g Caps Generic drug: Psyllium Take 0.4 g by mouth daily. Take 4 capsules by mouth daily   methenamine 1 g tablet Commonly known as: HIPREX Take 1 g by mouth daily.   metoprolol succinate 25 MG 24 hr tablet Commonly known as: TOPROL-XL Take 25 mg by  mouth daily.   mirabegron ER 25 MG Tb24 tablet Commonly known as: MYRBETRIQ Take 1 tablet by mouth daily.   nystatin powder Generic drug: nystatin Apply 1 Application topically 2 (two) times daily.   oxybutynin 5 MG 24 hr tablet Commonly known as: DITROPAN-XL Take 5 mg by mouth in the morning and at bedtime.   pantoprazole 20 MG tablet Commonly known as: PROTONIX Take 20 mg by mouth daily.   Pataday 0.2 % Soln Generic drug: Olopatadine HCl Apply to eye daily as needed.   QUEtiapine 50 MG tablet Commonly known as: SEROQUEL Take 50 mg by mouth at bedtime.    sulfamethoxazole-trimethoprim 800-160 MG tablet Commonly known as: BACTRIM DS Take 1 tablet by mouth 2 (two) times daily for 9 days.   traMADol 50 MG tablet Commonly known as: Ultram Take 1 tablet (50 mg total) by mouth every 6 (six) hours as needed. What changed:  when to take this reasons to take this   traZODone 100 MG tablet Commonly known as: DESYREL Take 125 mg by mouth at bedtime.   traZODone 50 MG tablet Commonly known as: DESYREL Take 25 mg by mouth at bedtime as needed for sleep.        Vitals:   09/18/22 0511 09/18/22 0834  BP: 136/67 (!) 129/57  Pulse: (!) 50 (!) 46  Resp: 16 18  Temp: 98.2 F (36.8 C) 97.8 F (36.6 C)  SpO2: 99%     Skin clean, dry and intact without evidence of skin break down, no evidence of skin tears noted. IV catheter discontinued intact. Site without signs and symptoms of complications. Dressing and pressure applied. Pt denies pain at this time. No complaints noted.  An After Visit Summary was printed and given to the patient. Patient escorted via WC, and D/C home via private auto.  Tamrah Victorino C. Jilda Roche

## 2022-09-18 NOTE — Discharge Summary (Signed)
Physician Discharge Summary  Shelly Freeman ZOX:[id number] DOB: [birth year] DOA: 09/13/2022  PCP: Unice Bailey, MD  Admit date: 09/13/2022 Discharge date: 09/18/2022  Admitted From: Home Disposition:  Home  Recommendations for Outpatient Follow-up:  Follow up with PCP in 1-2 weeks  Home Health:PT   Discharge Condition:Stable  CODE STATUS:Full  Diet recommendation:  as tolerated   Brief/Interim Summary: Patient is a 72 year old female with history of COPD, GERD, nephrolithiasis hypothyroidism, lupus, depression, chronic pain syndrome who presented with complaint of right flank pain about 2 to 3 days, generalized body ache.  Patient was recently admitted on March and was managed for sepsis secondary to UTI, AKI.  Urine culture at that time showed ESBL E. coli.  On presentation she was afebrile, hemodynamically stable.  Lab work showed WC count of 14, UA was suspicious of UTI.  CT chest/abdomen/pelvis showed right-sided hydroureteronephrosis as well as cystitis.  Patient started on antibiotic.     5/11: Vital stable.  Continue to have lower abdominal and flank pain.  Urine cultures again with ESBL E. coli.  PT is recommending SNF but patient wants to go home, need to clarify from [facility] whether she can get home health while staying on [facility].  She will not be able to return until Monday as [facility] has to pick her up.   5/12: Hemodynamically stable, continue to have pain-Toradol worked better than Dilaudid so it was discontinued. Message sent to ID for their recommendations in terms of duration and type of antibiotic due to recurrent ESBL infection.  They will see her tomorrow.   5/13: Continue to have left flank pain but improved than before. [facility] will arrange home health services and just want her to be discharged to them.   5/14: Stable for DC - pain improved but not completely resolved - Discussed need for repeat evaluation/imaging and possible need for Urology consult in the  outpatient setting but to follow up with PCP first.ID recommending 2 weeks of antibiotics with bactrim DS I BID until 09/27/22. Follow metabolic panel weekly.  Discharge Diagnoses:  Principal Problem:   Pyelonephritis Active Problems:   History of ESBL E. coli infection   COPD (chronic obstructive pulmonary disease) (HCC)   Hypothyroidism   Essential hypertension   GERD (gastroesophageal reflux disease)   Depression with anxiety   Primary osteoarthritis involving multiple joints   Cystitis  Right-sided flank pain/UTI:  CT imaging showed right-sided hydroureteronephrosis without obvious cause, suspected to be from recently passed calculus.  No evidence of pyelonephritis as per CT.  Asymmetric right sided bladder wall thickening suggesting cystitis. Case was discussed with urology this morning, Dr. Vanna Scotland - recommended follow-up with urology as an outpatient and she will arrange it continue current antibiotics.  History of ESBL E. coli infection - transition to bactrim DS per ID.   History of COPD/pulmonary hypertension: As seen on the CT.  We recommend outpatient follow-up with pulmonology.  CT also showed patchy groundglass opacity in both lungs.  Patient is asymptomatic and is saturating  fine on room air.  Continue home regimen   Hypothyroidism: Continue Synthyroid   Hypertension: Currently blood stable.  Continue home medications   Chronic pain syndrome/osteoarthritis of multiple joints: Continue supportive care, pain management.  Patient has chronic pain syndrome and always complains of back pain, generalized pain   Depression/anxiety: On BuSpar, trazodone   GERD: Continue PPI   Obesity: BMI of 41.5   Discharge Instructions  Discharge Instructions     Discharge patient  Complete by: As directed    Discharge disposition: 06-Home-Health Care Svc   Discharge patient date: 09/18/2022      Allergies as of 09/18/2022       Reactions   Cholestyramine Other (See  Comments)   severe        Medication List     TAKE these medications    acetaminophen 650 MG CR tablet Commonly known as: TYLENOL Take 650 mg by mouth 3 (three) times daily as needed for pain.   albuterol 108 (90 Base) MCG/ACT inhaler Commonly known as: VENTOLIN HFA Inhale into the lungs. Inhale 2 puffs into the lungs every 4 (four) hours as needed for wheezing or shortness of breath.   alendronate 70 MG tablet Commonly known as: FOSAMAX Take 70 mg by mouth once a week. Take with a full glass of water on an empty stomach.   busPIRone 15 MG tablet Commonly known as: BUSPAR Take 15 mg by mouth 2 (two) times daily.   calcium carbonate 750 MG chewable tablet Commonly known as: TUMS EX Chew 1 tablet by mouth 4 (four) times daily as needed for heartburn.   celecoxib 100 MG capsule Commonly known as: CELEBREX Take 1 capsule by mouth 2 (two) times daily.   cetirizine 10 MG tablet Commonly known as: ZYRTEC Take 10 mg by mouth daily.   cholecalciferol 25 MCG (1000 UNIT) tablet Commonly known as: VITAMIN D3 Take 1,000 Units by mouth daily.   cyanocobalamin 1000 MCG tablet Commonly known as: VITAMIN B12 Take 1,000 mcg by mouth daily.   diclofenac Sodium 1 % Gel Commonly known as: VOLTAREN Apply 2 g topically 4 (four) times daily.   escitalopram 20 MG tablet Commonly known as: LEXAPRO Take 20 mg by mouth daily.   estradiol 0.1 MG/GM vaginal cream Commonly known as: ESTRACE Place 0.5 g vaginally daily. Insert 1/2 gram into vagina every night as directed for two weeks then use twice weekly on Monday and Friday nights only   famotidine 20 MG tablet Commonly known as: PEPCID Take 20 mg by mouth 2 (two) times daily.   ferrous sulfate 325 (65 FE) MG tablet Take 325 mg by mouth every Monday, Wednesday, and Friday.   fluticasone 50 MCG/ACT nasal spray Commonly known as: FLONASE Place into both nostrils daily.   folic acid 1 MG tablet Commonly known as: FOLVITE Take  1 mg by mouth daily.   gabapentin 600 MG tablet Commonly known as: NEURONTIN Take 600 mg by mouth 3 (three) times daily.   hydrocortisone cream 1 % Apply 1 application topically 3 (three) times daily as needed for itching.   ipratropium-albuterol 0.5-2.5 (3) MG/3ML Soln Commonly known as: DUONEB Take 3 mLs by nebulization every 6 (six) hours as needed (shortness of breath).   levothyroxine 75 MCG tablet Commonly known as: SYNTHROID Take 75 mcg by mouth daily before breakfast.   lidocaine 5 % Commonly known as: LIDODERM Place 1 patch onto the skin daily. Remove & Discard patch within 12 hours or as directed by MD   liver oil-zinc oxide 40 % ointment Commonly known as: DESITIN Apply 1 Application topically as needed for irritation.   Metamucil 0.36 g Caps Generic drug: Psyllium Take 0.4 g by mouth daily. Take 4 capsules by mouth daily   methenamine 1 g tablet Commonly known as: HIPREX Take 1 g by mouth daily.   metoprolol succinate 25 MG 24 hr tablet Commonly known as: TOPROL-XL Take 25 mg by mouth daily.   mirabegron ER 25 MG  Tb24 tablet Commonly known as: MYRBETRIQ Take 1 tablet by mouth daily.   nystatin powder Generic drug: nystatin Apply 1 Application topically 2 (two) times daily.   oxybutynin 5 MG 24 hr tablet Commonly known as: DITROPAN-XL Take 5 mg by mouth in the morning and at bedtime.   pantoprazole 20 MG tablet Commonly known as: PROTONIX Take 20 mg by mouth daily.   Pataday 0.2 % Soln Generic drug: Olopatadine HCl Apply to eye daily as needed.   QUEtiapine 50 MG tablet Commonly known as: SEROQUEL Take 50 mg by mouth at bedtime.   sulfamethoxazole-trimethoprim 800-160 MG tablet Commonly known as: BACTRIM DS Take 1 tablet by mouth 2 (two) times daily for 9 days.   traMADol 50 MG tablet Commonly known as: Ultram Take 1 tablet (50 mg total) by mouth every 6 (six) hours as needed. What changed:  when to take this reasons to take this    traZODone 100 MG tablet Commonly known as: DESYREL Take 125 mg by mouth at bedtime.   traZODone 50 MG tablet Commonly known as: DESYREL Take 25 mg by mouth at bedtime as needed for sleep.        Follow-up Information     Schedule an appointment as soon as possible for a visit  with Unice Bailey, MD.   Specialty: Internal Medicine Contact information: [address] [place] [address] [phone]                Allergies  Allergen Reactions   Cholestyramine Other (See Comments)    severe    Consultations: ID   Procedures/Studies: CT Angio Chest/Abd/Pel for Dissection W and/or Wo Contrast  Result Date: 09/13/2022 CLINICAL DATA:  Acute aortic syndrome. Chest and upper back pain since yesterday. EXAM: CT ANGIOGRAPHY CHEST, ABDOMEN AND PELVIS TECHNIQUE: Non-contrast CT of the chest was initially obtained. Multidetector CT imaging through the chest, abdomen and pelvis was performed using the standard protocol during bolus administration of intravenous contrast. Multiplanar reconstructed images and MIPs were obtained and reviewed to evaluate the vascular anatomy. RADIATION DOSE REDUCTION: This exam was performed according to the departmental dose-optimization program which includes automated exposure control, adjustment of the mA and/or kV according to patient size and/or use of iterative reconstruction technique. CONTRAST:  OMNIPAQUE IOHEXOL 350 MG/ML SOLN COMPARISON:  CT chest 05/14/2022 and CT abdomen/pelvis 07/20/2022 FINDINGS: CTA CHEST FINDINGS Cardiovascular: The heart is normal in size. No pericardial effusion. The aorta is normal in caliber. No dissection. Minimal scattered atherosclerotic calcifications. The branch vessels are patent. Stable scattered coronary artery calcifications. Stable enlargement of the pulmonary arterial trunk measuring 3.1 cm and suggesting pulmonary hypertension. The pulmonary arterial tree is well opacified. No filling defects to  suggest pulmonary embolism. Mediastinum/Nodes: No mediastinal or hilar mass or lymphadenopathy. Stable slipped Nissen fundoplication with a moderate-sized recurrent hiatal hernia. Lungs/Pleura: Stable underlying emphysematous changes and pulmonary scarring. Patchy E ground-glass opacity in both lungs could suggest small airways disease such as asthma constrictive bronchiolitis. Asymmetric edema, atypical/viral pneumonia or hypersensitivity pneumonitis. No focal airspace consolidation. No worrisome pulmonary lesions or pulmonary nodules. No pleural effusions or pleural nodules. Musculoskeletal: No breast masses, supraclavicular or axillary adenopathy. The bony thorax is intact. Review of the MIP images confirms the above findings. CTA ABDOMEN AND PELVIS FINDINGS VASCULAR Aorta: Normal caliber. Minimal scattered atherosclerotic calcifications for age. No dissection. Celiac: Mild ostial calcifications but no stenosis or dissection. SMA: Normal Renals: Normal IMA: Normal Inflow: Normal Veins: Normal Review of the MIP images confirms  the above findings. NON-VASCULAR Hepatobiliary: No hepatic lesions or intrahepatic biliary dilatation. The gallbladder is unremarkable. No common bile duct dilatation. Pancreas: Normal Spleen: Normal Adrenals/Urinary Tract: The adrenal glands are normal. The right kidney demonstrates mild hydronephrosis. There is also mild right hydroureter and perinephric interstitial changes. No renal or obstructing ureteral calculi are identified. Could not exclude a recently passed calculus. No definite findings for pyelonephritis recommend correlation with urinalysis. The bladder demonstrates mild asymmetric wall thickening on the right side. Could not exclude cystitis. 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 The small bowel and colon are unremarkable. No inflammatory changes obstructive findings.  Lymphatic: No abdominal or pelvic lymphadenopathy. Reproductive: The uterus and ovaries are unremarkable. Other: No pelvic mass or adenopathy. No free pelvic fluid collections. No inguinal mass or adenopathy. No abdominal wall hernia or subcutaneous lesions. Musculoskeletal: No significant bony findings. Review of the MIP images confirms the above findings. IMPRESSION: 1. Normal thoracic and abdominal aorta without aneurysm or dissection. 2. Stable enlargement of the pulmonary arterial trunk suggesting pulmonary hypertension. 3. Patchy ground-glass opacity in both lungs could suggest small airways disease such as asthma constrictive bronchiolitis. Asymmetric edema, atypical/viral pneumonia or hypersensitivity pneumonitis. 4. Stable slipped Nissen fundoplication with a moderate-sized recurrent hiatal hernia. 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 5. Right-sided hydroureteronephrosis without obvious cause. Possible recently passed calculus. No obvious changes of pyelonephritis. Asymmetric right-sided bladder wall thickening could suggest cystitis. Recommend correlation with urinalysis. Emphysema (ICD10-J43.9). Electronically Signed   By: Rudie Meyer M.D.   On: 09/13/2022 08:09   DG Chest Portable 1 View  Result Date: 09/13/2022 CLINICAL DATA:  Pain in central chest. EXAM: PORTABLE CHEST 1 VIEW COMPARISON:  07/20/2022 FINDINGS: Heart size and mediastinal contours are unremarkable. No pleural effusion or edema. No airspace opacities. Scarring noted within the left midlung. Remote right posterior rib fractures. IMPRESSION: 1. No acute findings. 2. Left midlung scarring. Electronically Signed   By: Signa Kell M.D.   On: 09/13/2022 05:57     Subjective: No acute issues/events overnight   Discharge Exam: Vitals:   09/17/22 1932 09/18/22 0511  BP: (!) 110/98 136/67  Pulse: 66 (!) 50  Resp: 18 16  Temp: 97.8 F (36.6 C) 98.2 F (36.8 C)  SpO2: 98% 99%   Vitals:   09/17/22 0800 09/17/22 1702  09/17/22 1932 09/18/22 0511  BP: (!) 127/53 (!) 127/56 (!) 110/98 136/67  Pulse: (!) 58 60 66 (!) 50  Resp: 15 16 18 16   Temp: 98.2 F (36.8 C) 98 F (36.7 C) 97.8 F (36.6 C) 98.2 F (36.8 C)  TempSrc: Oral   Oral  SpO2: 97% 99% 98% 99%  Weight:      Height:        General: Pt is alert, awake, not in acute distress Cardiovascular: RRR, S1/S2 +, no rubs, no gallops Respiratory: CTA bilaterally, no wheezing, no rhonchi Abdominal: Soft, NT, ND, bowel sounds + Extremities: no edema, no cyanosis    The results of significant diagnostics from this hospitalization (including imaging, microbiology, ancillary and laboratory) are listed below for reference.     Microbiology: Recent Results (from the past 240 hour(s))  Urine Culture     Status: Abnormal   Collection Time: 09/13/22  6:44 AM   Specimen: Urine, Clean Catch  Result Value Ref Range Status   Specimen Description   Final    URINE, CLEAN CATCH Performed at [facility], [address] [place], [place] [address]    Special Requests   Final    NONE Performed at [facility], [address] [place], [place] [address]    Culture (A)  Final    80,000 COLONIES/mL ESCHERICHIA COLI Confirmed Extended Spectrum Beta-Lactamase Producer (ESBL).  In bloodstream infections from ESBL organisms, carbapenems are preferred over piperacillin/tazobactam. They are shown to have a lower risk of mortality.    Report Status 09/15/2022 FINAL  Final   Organism ID, Bacteria ESCHERICHIA COLI (A)  Final      Susceptibility   Escherichia coli - MIC*    AMPICILLIN >=32 RESISTANT Resistant     CEFAZOLIN >=64 RESISTANT Resistant     CEFEPIME >=32 RESISTANT Resistant     CEFTRIAXONE >=64 RESISTANT Resistant     CIPROFLOXACIN >=4 RESISTANT Resistant     GENTAMICIN <=1 SENSITIVE Sensitive     IMIPENEM <=0.25 SENSITIVE Sensitive     NITROFURANTOIN <=16 SENSITIVE Sensitive     TRIMETH/SULFA <=20 SENSITIVE Sensitive      AMPICILLIN/SULBACTAM >=32 RESISTANT Resistant     PIP/TAZO 16 SENSITIVE Sensitive     * 80,000 COLONIES/mL ESCHERICHIA COLI  Blood culture (routine x 2)     Status: None (Preliminary result)   Collection Time: 09/13/22 12:39 PM   Specimen: BLOOD RIGHT HAND  Result Value Ref Range Status   Specimen Description BLOOD RIGHT HAND  Final   Special Requests   Final    BOTTLES DRAWN AEROBIC AND ANAEROBIC Blood Culture adequate volume   Culture   Final    NO GROWTH 4 DAYS Performed at [facility], [address] [place], [place] [address]    Report Status PENDING  Incomplete  Blood culture (routine x 2)     Status: None (Preliminary result)   Collection Time: 09/13/22 12:39 PM   Specimen: BLOOD LEFT ARM  Result Value Ref Range Status   Specimen Description BLOOD LEFT ARM  Final   Special Requests   Final    BOTTLES DRAWN AEROBIC AND ANAEROBIC Blood Culture adequate volume   Culture   Final    NO GROWTH 4 DAYS Performed at [facility], [address], [place], [place] [address]    Report Status PENDING  Incomplete  C Difficile Quick Screen w PCR reflex     Status: None   Collection Time: 09/15/22  3:25 PM   Specimen: STOOL  Result Value Ref Range Status   C Diff antigen NEGATIVE NEGATIVE Final   C Diff toxin NEGATIVE NEGATIVE Final   C Diff interpretation No C. difficile detected.  Final    Comment: Performed at [facility], [address] [place], [place] [address]  Gastrointestinal Panel by PCR , Stool     Status: None   Collection Time: 09/15/22  3:25 PM   Specimen: Stool  Result Value Ref Range Status   Campylobacter species NOT DETECTED NOT DETECTED Final   Plesimonas shigelloides NOT DETECTED NOT DETECTED Final   Salmonella species NOT DETECTED NOT DETECTED Final   Yersinia enterocolitica NOT DETECTED NOT DETECTED Final   Vibrio species NOT DETECTED NOT DETECTED Final   Vibrio cholerae NOT DETECTED NOT DETECTED Final   Enteroaggregative E  coli (EAEC) NOT DETECTED NOT DETECTED Final   Enteropathogenic E coli (EPEC) NOT DETECTED NOT DETECTED Final   Enterotoxigenic E coli (ETEC) NOT DETECTED NOT DETECTED Final   Shiga like toxin producing E coli (STEC) NOT DETECTED NOT DETECTED Final   Shigella/Enteroinvasive E coli (EIEC)  NOT DETECTED NOT DETECTED Final   Cryptosporidium NOT DETECTED NOT DETECTED Final   Cyclospora cayetanensis NOT DETECTED NOT DETECTED Final   Entamoeba histolytica NOT DETECTED NOT DETECTED Final   Giardia lamblia NOT DETECTED NOT DETECTED Final   Adenovirus F40/41 NOT DETECTED NOT DETECTED Final   Astrovirus NOT DETECTED NOT DETECTED Final   Norovirus GI/GII NOT DETECTED NOT DETECTED Final   Rotavirus A NOT DETECTED NOT DETECTED Final   Sapovirus (I, II, IV, and V) NOT DETECTED NOT DETECTED Final    Comment: Performed at [facility], [address] [place], [place] [address]     Labs: BNP (last 3 results) Recent Labs    07/20/22 1240  BNP 1,248.7*   Basic Metabolic Panel: Recent Labs  Lab 09/13/22 0523 09/14/22 0738 09/16/22 0529  NA 132* 137  --   K 4.0 3.8  --   CL 103 107  --   CO2 19* 23  --   GLUCOSE 117* 100*  --   BUN 17 14  --   CREATININE 0.99 0.99 0.86  CALCIUM 8.7* 8.6*  --    Liver Function Tests: Recent Labs  Lab 09/13/22 0523  AST 20  ALT 12  ALKPHOS 89  BILITOT 0.7  PROT 6.3*  ALBUMIN 3.2*   No results for input(s): "LIPASE", "AMYLASE" in the last 168 hours. No results for input(s): "AMMONIA" in the last 168 hours. CBC: Recent Labs  Lab 09/13/22 0523 09/14/22 0738 09/15/22 0543  WBC 14.0* 11.7* 8.3  HGB 12.3 12.3 12.9  HCT 38.2 39.3 41.6  MCV 95.5 98.0 99.5  PLT 166 174 190   Cardiac Enzymes: No results for input(s): "CKTOTAL", "CKMB", "CKMBINDEX", "TROPONINI" in the last 168 hours. BNP: Invalid input(s): "POCBNP" CBG: No results for input(s): "GLUCAP" in the last 168 hours. D-Dimer No results for input(s): "DDIMER" in the last 72  hours. Hgb A1c No results for input(s): "HGBA1C" in the last 72 hours. Lipid Profile No results for input(s): "CHOL", "HDL", "LDLCALC", "TRIG", "CHOLHDL", "LDLDIRECT" in the last 72 hours. Thyroid function studies No results for input(s): "TSH", "T4TOTAL", "T3FREE", "THYROIDAB" in the last 72 hours.  Invalid input(s): "FREET3" Anemia work up No results for input(s): "VITAMINB12", "FOLATE", "FERRITIN", "TIBC", "IRON", "RETICCTPCT" in the last 72 hours. Urinalysis    Component Value Date/Time   COLORURINE STRAW (A) 09/13/2022 0644   APPEARANCEUR HAZY (A) 09/13/2022 0644   APPEARANCEUR Clear 05/25/2021 1033   LABSPEC 1.003 (L) 09/13/2022 0644   PHURINE 8.0 09/13/2022 0644   GLUCOSEU NEGATIVE 09/13/2022 0644   HGBUR SMALL (A) 09/13/2022 0644   BILIRUBINUR NEGATIVE 09/13/2022 0644   BILIRUBINUR Negative 05/25/2021 1033   KETONESUR NEGATIVE 09/13/2022 0644   PROTEINUR NEGATIVE 09/13/2022 0644   NITRITE NEGATIVE 09/13/2022 0644   LEUKOCYTESUR LARGE (A) 09/13/2022 0644   Sepsis Labs Recent Labs  Lab 09/13/22 0523 09/14/22 0738 09/15/22 0543  WBC 14.0* 11.7* 8.3   Microbiology Recent Results (from the past 240 hour(s))  Urine Culture     Status: Abnormal   Collection Time: 09/13/22  6:44 AM   Specimen: Urine, Clean Catch  Result Value Ref Range Status   Specimen Description   Final    URINE, CLEAN CATCH Performed at [facility], [address] [place], [place] [address]    Special Requests   Final    NONE Performed at [facility], [address] [place], [place] [address]    Culture (A)  Final    80,000 COLONIES/mL  ESCHERICHIA COLI Confirmed Extended Spectrum Beta-Lactamase Producer (ESBL).  In bloodstream infections from ESBL organisms, carbapenems are preferred over piperacillin/tazobactam. They are shown to have a lower risk of mortality.    Report Status 09/15/2022 FINAL  Final   Organism ID, Bacteria ESCHERICHIA COLI (A)  Final       Susceptibility   Escherichia coli - MIC*    AMPICILLIN >=32 RESISTANT Resistant     CEFAZOLIN >=64 RESISTANT Resistant     CEFEPIME >=32 RESISTANT Resistant     CEFTRIAXONE >=64 RESISTANT Resistant     CIPROFLOXACIN >=4 RESISTANT Resistant     GENTAMICIN <=1 SENSITIVE Sensitive     IMIPENEM <=0.25 SENSITIVE Sensitive     NITROFURANTOIN <=16 SENSITIVE Sensitive     TRIMETH/SULFA <=20 SENSITIVE Sensitive     AMPICILLIN/SULBACTAM >=32 RESISTANT Resistant     PIP/TAZO 16 SENSITIVE Sensitive     * 80,000 COLONIES/mL ESCHERICHIA COLI  Blood culture (routine x 2)     Status: None (Preliminary result)   Collection Time: 09/13/22 12:39 PM   Specimen: BLOOD RIGHT HAND  Result Value Ref Range Status   Specimen Description BLOOD RIGHT HAND  Final   Special Requests   Final    BOTTLES DRAWN AEROBIC AND ANAEROBIC Blood Culture adequate volume   Culture   Final    NO GROWTH 4 DAYS Performed at [facility], [address] [place], [place] [address]    Report Status PENDING  Incomplete  Blood culture (routine x 2)     Status: None (Preliminary result)   Collection Time: 09/13/22 12:39 PM   Specimen: BLOOD LEFT ARM  Result Value Ref Range Status   Specimen Description BLOOD LEFT ARM  Final   Special Requests   Final    BOTTLES DRAWN AEROBIC AND ANAEROBIC Blood Culture adequate volume   Culture   Final    NO GROWTH 4 DAYS Performed at [facility], [address], [place], [place] [address]    Report Status PENDING  Incomplete  C Difficile Quick Screen w PCR reflex     Status: None   Collection Time: 09/15/22  3:25 PM   Specimen: STOOL  Result Value Ref Range Status   C Diff antigen NEGATIVE NEGATIVE Final   C Diff toxin NEGATIVE NEGATIVE Final   C Diff interpretation No C. difficile detected.  Final    Comment: Performed at [facility], [address] [place], [place] [address]  Gastrointestinal Panel by PCR , Stool     Status: None   Collection Time:  09/15/22  3:25 PM   Specimen: Stool  Result Value Ref Range Status   Campylobacter species NOT DETECTED NOT DETECTED Final   Plesimonas shigelloides NOT DETECTED NOT DETECTED Final   Salmonella species NOT DETECTED NOT DETECTED Final   Yersinia enterocolitica NOT DETECTED NOT DETECTED Final   Vibrio species NOT DETECTED NOT DETECTED Final   Vibrio cholerae NOT DETECTED NOT DETECTED Final   Enteroaggregative E coli (EAEC) NOT DETECTED NOT DETECTED Final   Enteropathogenic E coli (EPEC) NOT DETECTED NOT DETECTED Final   Enterotoxigenic E coli (ETEC) NOT DETECTED NOT DETECTED Final   Shiga like toxin producing E coli (STEC) NOT DETECTED NOT DETECTED Final   Shigella/Enteroinvasive E coli (EIEC) NOT DETECTED NOT DETECTED Final   Cryptosporidium NOT DETECTED NOT DETECTED Final   Cyclospora cayetanensis NOT DETECTED NOT DETECTED Final   Entamoeba histolytica NOT DETECTED NOT DETECTED Final   Giardia lamblia NOT DETECTED NOT DETECTED Final  Adenovirus F40/41 NOT DETECTED NOT DETECTED Final   Astrovirus NOT DETECTED NOT DETECTED Final   Norovirus GI/GII NOT DETECTED NOT DETECTED Final   Rotavirus A NOT DETECTED NOT DETECTED Final   Sapovirus (I, II, IV, and V) NOT DETECTED NOT DETECTED Final    Comment: Performed at [facility], [address] [place], [place] [address]     Time coordinating discharge: Over 30 minutes  SIGNED:   Azucena Fallen, DO [facility] 09/18/2022, 7:09 AM Pager   If 7PM-7AM, please contact night-coverage [email or web address]

## 2022-09-18 NOTE — TOC Transition Note (Signed)
Transition of Care [facility]) - CM/SW Discharge Note   Patient Details  Name: Shelly Freeman MRN: [id number] Date of Birth: 07-Aug-1950  Transition of Care [facility]) CM/SW Contact:  Darleene Cleaver, LCSW Phone Number: 09/18/2022, 11:56 AM   Clinical Narrative:     CSW received phone call [name] [phone] from [facility], they are able to pick patient up today and transport her to the [facility] program.  CSW attempted to contact patient's granddaughter, and left a voice mail to inform her that patient is discharging today.  Patient was offered SNF placement and she declined.  Per [facility] they will provide therapy for patient at the day program.  TOC signing off, please reconsult if other social work needs arise.     Final next level of care: Home/Self Care Barriers to Discharge: Barriers Resolved   Patient Goals and CMS Choice CMS Medicare.gov Compare Post Acute Care list provided to:: Patient Choice offered to / list presented to : Patient  Discharge Placement                    Name of family member notified: Left voice mail on granddaughter [name] phone [phone] Patient and family notified of of transfer: 09/18/22  Discharge Plan and Services Additional resources added to the After Visit Summary for                                       Social Determinants of Health (SDOH) Interventions SDOH Screenings   Food Insecurity: No Food Insecurity (09/13/2022)  Housing: Low Risk  (09/13/2022)  Transportation Needs: No Transportation Needs (09/13/2022)  Utilities: Not At Risk (09/13/2022)  Alcohol Screen: Low Risk  (12/09/2017)  Tobacco Use: Low Risk  (09/13/2022)     Readmission Risk Interventions    01/28/2021   12:19 PM  Readmission Risk Prevention Plan  Transportation Screening Complete  PCP or Specialist Appt within 3-5 Days Complete  HRI or Home Care Consult Complete  Social Work Consult for Recovery Care Planning/Counseling Complete  Palliative Care Screening Not  Applicable  Medication Review Oceanographer) Complete

## 2022-09-26 NOTE — Progress Notes (Signed)
09/27/2022 12:36 PM   Shelly Freeman 1950/09/11 [id number]  Referring provider: Unice Bailey, MD [address] [place],  [place] [address]  Urological history: 1. Nephrolithiasis -ESWL x several -[facility] > 20 years ago  2. rUTI's -contributing factors of age and GSM -cysto (12/2021) NED -urine cytology (06/2021) negative  -documented urine cultures over the last year  01/02/2022 enterococcus faecalis and E.coli  07/20/2022 E.coli  09/13/2022 E.coli  Chief Complaint  Patient presents with   Follow-up    HPI: Shelly Freeman is a 72 y.o. female who presents today for follow up after hospitalization with interpretive services, Shelly Freeman [phone]  Previous records reviewed.   Shelly Freeman was admitted on 09/13/2022 for ESBL E.coli pyelonephritis.  CT w/wo right sided hydroureteronephrosis.    Shelly Freeman is a difficult historian and even the interpreter had a difficult time understanding what Shelly Freeman was trying to convey.    When Shelly Freeman presented to the emergency room on Sep 13, 2022, her urological symptoms consisted of nausea, right-sided back pain that radiated to the right waist, chills, frequency, dysuria and low blood pressure.  Now, Shelly Freeman is no longer having the symptoms.  Shelly Freeman does have baseline symptoms of nocturia x 4-5 and 4-5 trips to the restroom during the day.  Shelly Freeman states this is gone on for several years.  Shelly Freeman also states that Shelly Freeman had to be hospitalized as a child for infections and Shelly Freeman has been plagued with UTIs her whole life.  UA today is yellow clear, specific gravity 1.025, pH 5.0, 0-5 WBCs, 0-2 RBCs, greater than 10 epithelial cells, calcium oxalate crystals present and a few bacteria.  PVR 27 mL  PMH: Past Medical History:  Diagnosis Date   Anginal pain (HCC)    Arthritis    Collagen vascular disease (HCC)    COPD (chronic obstructive pulmonary disease) (HCC)    DDD (degenerative disc disease), lumbar 05/14/2016   Depression    Dizziness    Dyspnea    GERD  (gastroesophageal reflux disease)    Heart valve problem    per grandaughter need surgery but they told her may not survive;   History of kidney stones    Hypertension    Hypothyroidism    Lupus (HCC)    Neuromuscular disorder (HCC)    Sleep apnea    no CPAP   Thyroid disease    Vitamin D deficiency 05/14/2016    Surgical History: Past Surgical History:  Procedure Laterality Date   APPENDECTOMY     BREAST BIOPSY Left    neg   CATARACT EXTRACTION W/PHACO Left 11/13/2021   Procedure: CATARACT EXTRACTION PHACO AND INTRAOCULAR LENS PLACEMENT (IOC) LEFT 3.63 00:35.9;  Surgeon: Nevada Crane, MD;  Location: [facility];  Service: Ophthalmology;  Laterality: Left;   CATARACT EXTRACTION W/PHACO Right 12/04/2021   Procedure: CATARACT EXTRACTION PHACO AND INTRAOCULAR LENS PLACEMENT (IOC) RIGHT;  Surgeon: Nevada Crane, MD;  Location: [facility];  Service: Ophthalmology;  Laterality: Right;  3.11 00:32.6   CESAREAN SECTION     x 2   COLONOSCOPY N/A 08/24/2021   Procedure: COLONOSCOPY;  Surgeon: Jaynie Collins, DO;  Location: [facility];  Service: Gastroenterology;  Laterality: N/A;  SPANISH INTERPRETER   COLONOSCOPY WITH PROPOFOL N/A 07/01/2017   Procedure: COLONOSCOPY WITH PROPOFOL;  Surgeon: Scot Jun, MD;  Location: [facility];  Service: Endoscopy;  Laterality: N/A;   ESOPHAGOGASTRODUODENOSCOPY (EGD) WITH PROPOFOL N/A 07/01/2017   Procedure: ESOPHAGOGASTRODUODENOSCOPY (EGD) WITH PROPOFOL;  Surgeon: Scot Jun,  MD;  Location: [facility] ENDOSCOPY;  Service: Endoscopy;  Laterality: N/A;   GASTRIC BYPASS     LIPOMA EXCISION Left 06/14/2022   Procedure: EXCISION LIPOMA, left chest wall;  Surgeon: Leafy Ro, MD;  Location: [facility] ORS;  Service: General;  Laterality: Left;   LITHOTRIPSY     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   Procedure: TOTAL KNEE ARTHROPLASTY;  Surgeon: Kennedy Bucker, MD;  Location: [facility] ORS;  Service: Orthopedics;   Laterality: Left;   TUBAL LIGATION      Home Medications:  Allergies as of 09/27/2022       Reactions   Cholestyramine Other (See Comments)   severe        Medication List        Accurate as of Sep 27, 2022 12:36 PM. If you have any questions, ask your nurse or doctor.          acetaminophen 650 MG CR tablet Commonly known as: TYLENOL Take 650 mg by mouth 3 (three) times daily as needed for pain.   albuterol 108 (90 Base) MCG/ACT inhaler Commonly known as: VENTOLIN HFA Inhale into the lungs. Inhale 2 puffs into the lungs every 4 (four) hours as needed for wheezing or shortness of breath.   alendronate 70 MG tablet Commonly known as: FOSAMAX Take 70 mg by mouth once a week. Take with a full glass of water on an empty stomach.   busPIRone 15 MG tablet Commonly known as: BUSPAR Take 15 mg by mouth 2 (two) times daily.   calcium carbonate 750 MG chewable tablet Commonly known as: TUMS EX Chew 1 tablet by mouth 4 (four) times daily as needed for heartburn.   celecoxib 100 MG capsule Commonly known as: CELEBREX Take 1 capsule by mouth 2 (two) times daily.   cetirizine 10 MG tablet Commonly known as: ZYRTEC Take 10 mg by mouth daily.   cholecalciferol 25 MCG (1000 UNIT) tablet Commonly known as: VITAMIN D3 Take 1,000 Units by mouth daily.   cyanocobalamin 1000 MCG tablet Commonly known as: VITAMIN B12 Take 1,000 mcg by mouth daily.   diclofenac Sodium 1 % Gel Commonly known as: VOLTAREN Apply 2 g topically 4 (four) times daily.   escitalopram 20 MG tablet Commonly known as: LEXAPRO Take 20 mg by mouth daily.   estradiol 0.1 MG/GM vaginal cream Commonly known as: ESTRACE Place 0.5 g vaginally daily. Insert 1/2 gram into vagina every night as directed for two weeks then use twice weekly on Monday and Friday nights only   famotidine 20 MG tablet Commonly known as: PEPCID Take 20 mg by mouth 2 (two) times daily.   ferrous sulfate 325 (65 FE) MG  tablet Take 325 mg by mouth every Monday, Wednesday, and Friday.   fluticasone 50 MCG/ACT nasal spray Commonly known as: FLONASE Place into both nostrils daily.   folic acid 1 MG tablet Commonly known as: FOLVITE Take 1 mg by mouth daily.   gabapentin 600 MG tablet Commonly known as: NEURONTIN Take 600 mg by mouth 3 (three) times daily.   hydrocortisone cream 1 % Apply 1 application topically 3 (three) times daily as needed for itching.   ipratropium-albuterol 0.5-2.5 (3) MG/3ML Soln Commonly known as: DUONEB Take 3 mLs by nebulization every 6 (six) hours as needed (shortness of breath).   levothyroxine 75 MCG tablet Commonly known as: SYNTHROID Take 75 mcg by mouth daily before breakfast.   lidocaine 5 % Commonly known as: LIDODERM Place 1 patch onto the skin daily. Remove &  Discard patch within 12 hours or as directed by MD   liver oil-zinc oxide 40 % ointment Commonly known as: DESITIN Apply 1 Application topically as needed for irritation.   Metamucil 0.36 g Caps Generic drug: Psyllium Take 0.4 g by mouth daily. Take 4 capsules by mouth daily   methenamine 1 g tablet Commonly known as: HIPREX Take 1 g by mouth daily.   metoprolol succinate 25 MG 24 hr tablet Commonly known as: TOPROL-XL Take 25 mg by mouth daily.   mirabegron ER 25 MG Tb24 tablet Commonly known as: MYRBETRIQ Take 1 tablet by mouth daily.   nystatin powder Generic drug: nystatin Apply 1 Application topically 2 (two) times daily.   oxybutynin 5 MG 24 hr tablet Commonly known as: DITROPAN-XL Take 5 mg by mouth in the morning and at bedtime.   pantoprazole 20 MG tablet Commonly known as: PROTONIX Take 20 mg by mouth daily.   Pataday 0.2 % Soln Generic drug: Olopatadine HCl Apply to eye daily as needed.   QUEtiapine 50 MG tablet Commonly known as: SEROQUEL Take 50 mg by mouth at bedtime.   sulfamethoxazole-trimethoprim 800-160 MG tablet Commonly known as: BACTRIM DS Take 1 tablet  by mouth 2 (two) times daily for 9 days.   traMADol 50 MG tablet Commonly known as: Ultram Take 1 tablet (50 mg total) by mouth every 6 (six) hours as needed. What changed:  when to take this reasons to take this   traZODone 100 MG tablet Commonly known as: DESYREL Take 125 mg by mouth at bedtime.   traZODone 50 MG tablet Commonly known as: DESYREL Take 25 mg by mouth at bedtime as needed for sleep.        Allergies:  Allergies  Allergen Reactions   Cholestyramine Other (See Comments)    severe    Family History: Family History  Problem Relation Age of Onset   Breast cancer Maternal Aunt    Breast cancer Cousin     Social History:  reports that Shelly Freeman has never smoked. Shelly Freeman has never used smokeless tobacco. Shelly Freeman reports that Shelly Freeman does not drink alcohol and does not use drugs.  ROS: Pertinent ROS in HPI  Physical Exam: BP 110/76   Pulse 77   Ht 5\' 1"  (1.549 m)   Wt 220 lb (99.8 kg)   BMI 41.57 kg/m   Constitutional:  Well nourished. Alert and oriented, No acute distress. HEENT: [place] AT, moist mucus membranes.  Trachea midline Cardiovascular: No clubbing, cyanosis, or edema. Respiratory: Normal respiratory effort, no increased work of breathing. Neurologic: Grossly intact, no focal deficits, moving all 4 extremities. Psychiatric: Normal mood and affect.    Laboratory Data: Lab Results  Component Value Date   WBC 8.3 09/15/2022   HGB 12.9 09/15/2022   HCT 41.6 09/15/2022   MCV 99.5 09/15/2022   PLT 190 09/15/2022    Lab Results  Component Value Date   CREATININE 0.86 09/16/2022    Lab Results  Component Value Date   AST 20 09/13/2022   Lab Results  Component Value Date   ALT 12 09/13/2022    Urinalysis    Component Value Date/Time   COLORURINE STRAW (A) 09/13/2022 0644   APPEARANCEUR HAZY (A) 09/13/2022 0644   APPEARANCEUR Clear 05/25/2021 1033   LABSPEC 1.003 (L) 09/13/2022 0644   PHURINE 8.0 09/13/2022 0644   GLUCOSEU NEGATIVE 09/13/2022  0644   HGBUR SMALL (A) 09/13/2022 0644   BILIRUBINUR NEGATIVE 09/13/2022 0644   BILIRUBINUR Negative 05/25/2021 1033  KETONESUR NEGATIVE 09/13/2022 0644   PROTEINUR NEGATIVE 09/13/2022 0644   NITRITE NEGATIVE 09/13/2022 0644   LEUKOCYTESUR LARGE (A) 09/13/2022 0644  I have reviewed the labs.   Pertinent Imaging:  09/27/22 10:54  Scan Result 27 ml    Assessment & Plan:    1. Right hydronephrosis -looking over the last several CT's it appears that the right hydronephrosis is waxing and waning  -may be in response to inflammation -her serum creatinine is normal -will have her return in one month for repeat UA, BMP and RUS for monitoring  -return precautions reviewed  2. rUTI's -continue vaginal estrogen cream  -return in one month for more discussion as there was some language barrier with this particular interpreter  No follow-ups on file.  These notes generated with voice recognition software. I apologize for typographical errors.  Cloretta Ned  [facility] [address]  [address] [place], [place] [address] [phone]

## 2022-09-27 ENCOUNTER — Ambulatory Visit (INDEPENDENT_AMBULATORY_CARE_PROVIDER_SITE_OTHER): Payer: Medicare (Managed Care) | Admitting: Urology

## 2022-09-27 VITALS — BP 110/76 | HR 77 | Ht 61.0 in | Wt 220.0 lb

## 2022-09-27 DIAGNOSIS — N1330 Unspecified hydronephrosis: Secondary | ICD-10-CM | POA: Diagnosis not present

## 2022-09-27 LAB — URINALYSIS, COMPLETE
Bilirubin, UA: NEGATIVE
Glucose, UA: NEGATIVE
Ketones, UA: NEGATIVE
Leukocytes,UA: NEGATIVE
Nitrite, UA: NEGATIVE
Protein,UA: NEGATIVE
RBC, UA: NEGATIVE
Specific Gravity, UA: 1.025 (ref 1.005–1.030)
Urobilinogen, Ur: 0.2 mg/dL (ref 0.2–1.0)
pH, UA: 5 (ref 5.0–7.5)

## 2022-09-27 LAB — MICROSCOPIC EXAMINATION: Epithelial Cells (non renal): >10 /hpf — AB (ref 0–10)

## 2022-09-27 LAB — BLADDER SCAN AMB NON-IMAGING: Scan Result: 27

## 2022-09-29 LAB — CULTURE, URINE COMPREHENSIVE

## 2022-10-02 ENCOUNTER — Other Ambulatory Visit: Payer: Self-pay | Admitting: Family Medicine

## 2022-10-02 LAB — CULTURE, URINE COMPREHENSIVE

## 2022-10-02 MED ORDER — NITROFURANTOIN MONOHYD MACRO 100 MG PO CAPS: 100.0000 mg | ORAL_CAPSULE | Freq: Two times a day (BID) | ORAL | 0 refills | Status: DC

## 2022-10-24 ENCOUNTER — Encounter: Payer: Self-pay | Admitting: Urology

## 2022-11-09 ENCOUNTER — Ambulatory Visit (INDEPENDENT_AMBULATORY_CARE_PROVIDER_SITE_OTHER): Payer: Medicare (Managed Care) | Admitting: Urology

## 2022-11-09 ENCOUNTER — Encounter: Payer: Self-pay | Admitting: Urology

## 2022-11-09 VITALS — BP 117/77 | HR 79 | Ht 61.0 in | Wt 220.0 lb

## 2022-11-09 DIAGNOSIS — N1330 Unspecified hydronephrosis: Secondary | ICD-10-CM | POA: Diagnosis not present

## 2022-11-09 DIAGNOSIS — Z87440 Personal history of urinary (tract) infections: Secondary | ICD-10-CM | POA: Diagnosis not present

## 2022-11-09 DIAGNOSIS — N3946 Mixed incontinence: Secondary | ICD-10-CM | POA: Diagnosis not present

## 2022-11-09 LAB — URINALYSIS, COMPLETE
Bilirubin, UA: NEGATIVE
Glucose, UA: NEGATIVE
Ketones, UA: NEGATIVE
Nitrite, UA: NEGATIVE
RBC, UA: NEGATIVE
Specific Gravity, UA: 1.02 (ref 1.005–1.030)
Urobilinogen, Ur: 0.2 mg/dL (ref 0.2–1.0)
pH, UA: 5.5 (ref 5.0–7.5)

## 2022-11-09 LAB — MICROSCOPIC EXAMINATION: WBC, UA: >30 /hpf — AB (ref 0–5)

## 2022-11-09 LAB — BLADDER SCAN AMB NON-IMAGING

## 2022-11-09 MED ORDER — MIRABEGRON ER 50 MG PO TB24
50.0000 mg | ORAL_TABLET | Freq: Every day | ORAL | 3 refills | Status: DC
Start: 2022-11-09 — End: 2022-12-24

## 2022-11-09 NOTE — Progress Notes (Signed)
09/27/2022 12:36 PM   Shelly Freeman Sep 06, 1950 [id number]  Referring provider: Unice Bailey, MD [address] [place],  [place] [address]  Urological history: 1. Nephrolithiasis -ESWL x several -PCNL > 20 years ago  2. rUTI's -contributing factors of age and GSM -cysto (12/2021) NED -urine cytology (06/2021) negative  -documented urine cultures over the last year  01/02/2022 enterococcus faecalis and E.coli  07/20/2022 E.coli  09/13/2022 E.coli  Chief Complaint  Patient presents with   Follow-up    HPI: Shelly Freeman is a 72 y.o. female who presents today for follow up after hospitalization with interpretive services, Bradly Chris [phone].  Previous records reviewed.   At her visit on 09/27/2022, she was admitted on 09/13/2022 for ESBL E.coli pyelonephritis.  CT w/wo right sided hydroureteronephrosis.   She is a difficult historian and even the interpreter had a difficult time understanding what she was trying to convey.   When she presented to the emergency room on Sep 13, 2022, her urological symptoms consisted of nausea, right-sided back pain that radiated to the right waist, chills, frequency, dysuria and low blood pressure.  Now, she is no longer having the symptoms.  She does have baseline symptoms of nocturia x 4-5 and 4-5 trips to the restroom during the day.  She states this is gone on for several years.  She also states that she had to be hospitalized as a child for infections and she has been plagued with UTIs her whole life.  UA today is yellow clear, specific gravity 1.025, pH 5.0, 0-5 WBCs, 0-2 RBCs, greater than 10 epithelial cells, calcium oxalate crystals present and a few bacteria.  PVR 27 mL.  Urine culture was positive for Enterococcus faecalis.   She was treated with 7 days of culture appropriate antibiotics.  Today, she is currently suffering from the flu.  She is very anxious concerning her bladder.  She states for the last 2 years, her urine just gushes  out.  She is currently on oxybutynin 5 mg twice daily and Myrbetriq 25 mg daily.  She states these have not helped.  She states she loses her urine both while laughing or coughing and also loses urine when she cannot get to the restroom on time.  Patient denies any modifying or aggravating factors.  Patient denies any recent UTI's, gross hematuria, dysuria or suprapubic/flank pain.  Patient denies any fevers, chills, nausea or vomiting.   UA yellow slightly cloudy, specific gravity 1.020, pH 5.5, trace protein, +1 leukocyte, greater than 30 WBCs, 0-2 RBCs, 0-10 epithelial cells, WBC casts present, hyaline cast present granular casts present mucus threads present and moderate bacteria.  PVR 1 mL   PMH: Past Medical History:  Diagnosis Date   Anginal pain (HCC)    Arthritis    Collagen vascular disease (HCC)    COPD (chronic obstructive pulmonary disease) (HCC)    DDD (degenerative disc disease), lumbar 05/14/2016   Depression    Dizziness    Dyspnea    GERD (gastroesophageal reflux disease)    Heart valve problem    per grandaughter need surgery but they told her may not survive;   History of kidney stones    Hypertension    Hypothyroidism    Lupus (HCC)    Neuromuscular disorder (HCC)    Sleep apnea    no CPAP   Thyroid disease    Vitamin D deficiency 05/14/2016    Surgical History: Past Surgical History:  Procedure Laterality Date   APPENDECTOMY  BREAST BIOPSY Left    neg   CATARACT EXTRACTION W/PHACO Left 11/13/2021   Procedure: CATARACT EXTRACTION PHACO AND INTRAOCULAR LENS PLACEMENT (IOC) LEFT 3.63 00:35.9;  Surgeon: Nevada Crane, MD;  Location: [facility];  Service: Ophthalmology;  Laterality: Left;   CATARACT EXTRACTION W/PHACO Right 12/04/2021   Procedure: CATARACT EXTRACTION PHACO AND INTRAOCULAR LENS PLACEMENT (IOC) RIGHT;  Surgeon: Nevada Crane, MD;  Location: [facility];  Service: Ophthalmology;  Laterality: Right;  3.11 00:32.6    CESAREAN SECTION     x 2   COLONOSCOPY N/A 08/24/2021   Procedure: COLONOSCOPY;  Surgeon: Jaynie Collins, DO;  Location: [facility];  Service: Gastroenterology;  Laterality: N/A;  SPANISH INTERPRETER   COLONOSCOPY WITH PROPOFOL N/A 07/01/2017   Procedure: COLONOSCOPY WITH PROPOFOL;  Surgeon: Scot Jun, MD;  Location: [facility] END[facility];  Service: Endoscopy;  Laterality: N/A;   ESOPHAGOGASTRODUODENOSCOPY (EGD) WITH PROPOFOL N/A 07/01/2017   Procedure: ESOPHAGOGASTRODUODENOSCOPY (EGD) WITH PROPOFOL;  Surgeon: Scot Jun, MD;  Location: [facility];  Service: Endoscopy;  Laterality: N/A;   GASTRIC BYPASS     LIPOMA EXCISION Left 06/14/2022   Procedure: EXCISION LIPOMA, left chest wall;  Surgeon: Leafy Ro, MD;  Location: [facility];  Service: General;  Laterality: Left;   LITHOTRIPSY     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   Procedure: TOTAL KNEE ARTHROPLASTY;  Surgeon: Kennedy Bucker, MD;  Location: [facility] ORS;  Service: Orthopedics;  Laterality: Left;   TUBAL LIGATION      Home Medications:  Allergies as of 09/27/2022       Reactions   Cholestyramine Other (See Comments)   severe        Medication List        Accurate as of Sep 27, 2022 12:36 PM. If you have any questions, ask your nurse or doctor.          acetaminophen 650 MG CR tablet Commonly known as: TYLENOL Take 650 mg by mouth 3 (three) times daily as needed for pain.   albuterol 108 (90 Base) MCG/ACT inhaler Commonly known as: VENTOLIN HFA Inhale into the lungs. Inhale 2 puffs into the lungs every 4 (four) hours as needed for wheezing or shortness of breath.   alendronate 70 MG tablet Commonly known as: FOSAMAX Take 70 mg by mouth once a week. Take with a full glass of water on an empty stomach.   busPIRone 15 MG tablet Commonly known as: BUSPAR Take 15 mg by mouth 2 (two) times daily.   calcium carbonate 750 MG chewable tablet Commonly known as: TUMS EX Chew 1 tablet by mouth 4  (four) times daily as needed for heartburn.   celecoxib 100 MG capsule Commonly known as: CELEBREX Take 1 capsule by mouth 2 (two) times daily.   cetirizine 10 MG tablet Commonly known as: ZYRTEC Take 10 mg by mouth daily.   cholecalciferol 25 MCG (1000 UNIT) tablet Commonly known as: VITAMIN D3 Take 1,000 Units by mouth daily.   cyanocobalamin 1000 MCG tablet Commonly known as: VITAMIN B12 Take 1,000 mcg by mouth daily.   diclofenac Sodium 1 % Gel Commonly known as: VOLTAREN Apply 2 g topically 4 (four) times daily.   escitalopram 20 MG tablet Commonly known as: LEXAPRO Take 20 mg by mouth daily.   estradiol 0.1 MG/GM vaginal cream Commonly known as: ESTRACE Place 0.5 g vaginally daily. Insert 1/2 gram into vagina every night as directed for two weeks then use twice weekly on Monday and  Friday nights only   famotidine 20 MG tablet Commonly known as: PEPCID Take 20 mg by mouth 2 (two) times daily.   ferrous sulfate 325 (65 FE) MG tablet Take 325 mg by mouth every Monday, Wednesday, and Friday.   fluticasone 50 MCG/ACT nasal spray Commonly known as: FLONASE Place into both nostrils daily.   folic acid 1 MG tablet Commonly known as: FOLVITE Take 1 mg by mouth daily.   gabapentin 600 MG tablet Commonly known as: NEURONTIN Take 600 mg by mouth 3 (three) times daily.   hydrocortisone cream 1 % Apply 1 application topically 3 (three) times daily as needed for itching.   ipratropium-albuterol 0.5-2.5 (3) MG/3ML Soln Commonly known as: DUONEB Take 3 mLs by nebulization every 6 (six) hours as needed (shortness of breath).   levothyroxine 75 MCG tablet Commonly known as: SYNTHROID Take 75 mcg by mouth daily before breakfast.   lidocaine 5 % Commonly known as: LIDODERM Place 1 patch onto the skin daily. Remove & Discard patch within 12 hours or as directed by MD   liver oil-zinc oxide 40 % ointment Commonly known as: DESITIN Apply 1 Application topically as  needed for irritation.   Metamucil 0.36 g Caps Generic drug: Psyllium Take 0.4 g by mouth daily. Take 4 capsules by mouth daily   methenamine 1 g tablet Commonly known as: HIPREX Take 1 g by mouth daily.   metoprolol succinate 25 MG 24 hr tablet Commonly known as: TOPROL-XL Take 25 mg by mouth daily.   mirabegron ER 25 MG Tb24 tablet Commonly known as: MYRBETRIQ Take 1 tablet by mouth daily.   nystatin powder Generic drug: nystatin Apply 1 Application topically 2 (two) times daily.   oxybutynin 5 MG 24 hr tablet Commonly known as: DITROPAN-XL Take 5 mg by mouth in the morning and at bedtime.   pantoprazole 20 MG tablet Commonly known as: PROTONIX Take 20 mg by mouth daily.   Pataday 0.2 % Soln Generic drug: Olopatadine HCl Apply to eye daily as needed.   QUEtiapine 50 MG tablet Commonly known as: SEROQUEL Take 50 mg by mouth at bedtime.   sulfamethoxazole-trimethoprim 800-160 MG tablet Commonly known as: BACTRIM DS Take 1 tablet by mouth 2 (two) times daily for 9 days.   traMADol 50 MG tablet Commonly known as: Ultram Take 1 tablet (50 mg total) by mouth every 6 (six) hours as needed. What changed:  when to take this reasons to take this   traZODone 100 MG tablet Commonly known as: DESYREL Take 125 mg by mouth at bedtime.   traZODone 50 MG tablet Commonly known as: DESYREL Take 25 mg by mouth at bedtime as needed for sleep.        Allergies:  Allergies  Allergen Reactions   Cholestyramine Other (See Comments)    severe    Family History: Family History  Problem Relation Age of Onset   Breast cancer Maternal Aunt    Breast cancer Cousin     Social History:  reports that she has never smoked. She has never used smokeless tobacco. She reports that she does not drink alcohol and does not use drugs.  ROS: Pertinent ROS in HPI  Physical Exam: BP 110/76   Pulse 77   Ht 5\' 1"  (1.549 m)   Wt 220 lb (99.8 kg)   BMI 41.57 kg/m    Constitutional:  Well nourished. Alert and oriented, No acute distress. HEENT: [place] AT, moist mucus membranes.  Trachea midline Cardiovascular: No clubbing, cyanosis,  or edema. Respiratory: Normal respiratory effort, no increased work of breathing. Neurologic: Grossly intact, no focal deficits, moving all 4 extremities. Psychiatric: Normal mood and affect.    Laboratory Data: Urinalysis Results for orders placed or performed in visit on 11/09/22  Microscopic Examination   Urine  Result Value Ref Range   WBC, UA >30 (A) 0 - 5 /hpf   RBC, Urine 0-2 0 - 2 /hpf   Epithelial Cells (non renal) 0-10 0 - 10 /hpf   Casts Present (A) None seen /lpf   Cast Type White cell casts (A) N/A   Mucus, UA Present (A) Not Estab.   Bacteria, UA Moderate (A) None seen/Few  Urinalysis, Complete  Result Value Ref Range   Specific Gravity, UA 1.020 1.005 - 1.030   pH, UA 5.5 5.0 - 7.5   Color, UA Yellow Yellow   Appearance Ur Hazy (A) Clear   Leukocytes,UA 1+ (A) Negative   Protein,UA Trace (A) Negative/Trace   Glucose, UA Negative Negative   Ketones, UA Negative Negative   RBC, UA Negative Negative   Bilirubin, UA Negative Negative   Urobilinogen, Ur 0.2 0.2 - 1.0 mg/dL   Nitrite, UA Negative Negative   Microscopic Examination See below:   BLADDER SCAN AMB NON-IMAGING  Result Value Ref Range   Scan Result 1ml     I have reviewed the labs.   Pertinent Imaging:  11/09/22 09:07  Scan Result 1ml     Assessment & Plan:    1. Right hydronephrosis -looking over the last several CT's it appears that the right hydronephrosis is waxing and waning  -may be in response to inflammation -repeat BMP pending -RUS pending -UA with leukocytes and WBC casts -return precautions reviewed  2. rUTI's -continue vaginal estrogen cream  -Continue Hiprex 1 g twice daily -UA with leukocytes and casts, it will be sent for culture  3. Mixed incontinence -Having issues with both stress and urge and is  difficult to know which one is more pronounced -I will have her follow-up with Dr. Sherron Monday to see if more testing is indicated to evaluate her situation -In the interim I have increased her Myrbetriq from 25 mg daily to 50 mg daily and she will continue the oxybutynin 5 mg twice daily  No follow-ups on file.  These notes generated with voice recognition software. I apologize for typographical errors.  Cloretta Ned  [facility] [address]  [address] [place], [place] [address] [phone]

## 2022-11-10 LAB — BASIC METABOLIC PANEL
BUN/Creatinine Ratio: 16 (ref 12–28)
BUN: 17 mg/dL (ref 8–27)
CO2: 25 mmol/L (ref 20–29)
Calcium: 9.6 mg/dL (ref 8.7–10.3)
Chloride: 107 mmol/L — ABNORMAL HIGH (ref 96–106)
Creatinine, Ser: 1.04 mg/dL — ABNORMAL HIGH (ref 0.57–1.00)
Glucose: 119 mg/dL — ABNORMAL HIGH (ref 70–99)
Potassium: 4.7 mmol/L (ref 3.5–5.2)
Sodium: 144 mmol/L (ref 134–144)
eGFR: 58 mL/min/{1.73_m2} — ABNORMAL LOW (ref >59)

## 2022-11-14 LAB — CULTURE, URINE COMPREHENSIVE

## 2022-11-22 ENCOUNTER — Ambulatory Visit
Admission: RE | Admit: 2022-11-22 | Discharge: 2022-11-22 | Disposition: A | Discharge status: Home / Self Care | Payer: Medicare (Managed Care) | Source: Ambulatory Visit | Attending: Urology | Admitting: Urology

## 2022-11-22 DIAGNOSIS — N1330 Unspecified hydronephrosis: Secondary | ICD-10-CM | POA: Insufficient documentation

## 2022-12-13 ENCOUNTER — Emergency Department: Payer: Medicare (Managed Care)

## 2022-12-13 ENCOUNTER — Other Ambulatory Visit: Payer: Self-pay

## 2022-12-13 ENCOUNTER — Encounter: Payer: Self-pay | Admitting: Emergency Medicine

## 2022-12-13 ENCOUNTER — Emergency Department
Admission: EM | Admit: 2022-12-13 | Discharge: 2022-12-13 | Disposition: A | Discharge status: Home / Self Care | Payer: Medicare (Managed Care) | Attending: Emergency Medicine | Admitting: Emergency Medicine

## 2022-12-13 DIAGNOSIS — I10 Essential (primary) hypertension: Secondary | ICD-10-CM | POA: Diagnosis not present

## 2022-12-13 DIAGNOSIS — Z20822 Contact with and (suspected) exposure to covid-19: Secondary | ICD-10-CM | POA: Insufficient documentation

## 2022-12-13 DIAGNOSIS — S0990XA Unspecified injury of head, initial encounter: Secondary | ICD-10-CM | POA: Diagnosis present

## 2022-12-13 DIAGNOSIS — W01198A Fall on same level from slipping, tripping and stumbling with subsequent striking against other object, initial encounter: Secondary | ICD-10-CM | POA: Diagnosis not present

## 2022-12-13 DIAGNOSIS — Z8616 Personal history of COVID-19: Secondary | ICD-10-CM | POA: Diagnosis not present

## 2022-12-13 DIAGNOSIS — Z96652 Presence of left artificial knee joint: Secondary | ICD-10-CM | POA: Insufficient documentation

## 2022-12-13 DIAGNOSIS — N3000 Acute cystitis without hematuria: Secondary | ICD-10-CM | POA: Insufficient documentation

## 2022-12-13 DIAGNOSIS — E039 Hypothyroidism, unspecified: Secondary | ICD-10-CM | POA: Diagnosis not present

## 2022-12-13 DIAGNOSIS — W19XXXA Unspecified fall, initial encounter: Secondary | ICD-10-CM

## 2022-12-13 DIAGNOSIS — S8001XA Contusion of right knee, initial encounter: Secondary | ICD-10-CM | POA: Diagnosis not present

## 2022-12-13 DIAGNOSIS — J449 Chronic obstructive pulmonary disease, unspecified: Secondary | ICD-10-CM | POA: Insufficient documentation

## 2022-12-13 LAB — URINALYSIS, ROUTINE W REFLEX MICROSCOPIC
Bilirubin Urine: NEGATIVE
Glucose, UA: NEGATIVE mg/dL
Hgb urine dipstick: NEGATIVE
Ketones, ur: NEGATIVE mg/dL
Nitrite: NEGATIVE
Protein, ur: 30 mg/dL — AB
Specific Gravity, Urine: 1.012 (ref 1.005–1.030)
WBC, UA: >50 WBC/hpf (ref 0–5)
pH: 8 (ref 5.0–8.0)

## 2022-12-13 LAB — CBC WITH DIFFERENTIAL/PLATELET
Abs Immature Granulocytes: 0.12 10*3/uL — ABNORMAL HIGH (ref 0.00–0.07)
Basophils Absolute: 0.1 10*3/uL (ref 0.0–0.1)
Basophils Relative: 0 %
Eosinophils Absolute: 0.1 10*3/uL (ref 0.0–0.5)
Eosinophils Relative: 1 %
HCT: 39.9 % (ref 36.0–46.0)
Hemoglobin: 12.5 g/dL (ref 12.0–15.0)
Immature Granulocytes: 1 %
Lymphocytes Relative: 5 %
Lymphs Abs: 0.8 10*3/uL (ref 0.7–4.0)
MCH: 30 pg (ref 26.0–34.0)
MCHC: 31.3 g/dL (ref 30.0–36.0)
MCV: 95.9 fL (ref 80.0–100.0)
Monocytes Absolute: 0.7 10*3/uL (ref 0.1–1.0)
Monocytes Relative: 5 %
Neutro Abs: 14.2 10*3/uL — ABNORMAL HIGH (ref 1.7–7.7)
Neutrophils Relative %: 88 %
Platelets: 171 10*3/uL (ref 150–400)
RBC: 4.16 MIL/uL (ref 3.87–5.11)
RDW: 15.6 % — ABNORMAL HIGH (ref 11.5–15.5)
WBC: 15.9 10*3/uL — ABNORMAL HIGH (ref 4.0–10.5)
nRBC: 0 % (ref 0.0–0.2)

## 2022-12-13 LAB — SARS CORONAVIRUS 2 BY RT PCR: SARS Coronavirus 2 by RT PCR: NEGATIVE

## 2022-12-13 LAB — BASIC METABOLIC PANEL
Anion gap: 7 (ref 5–15)
BUN: 22 mg/dL (ref 8–23)
CO2: 27 mmol/L (ref 22–32)
Calcium: 9 mg/dL (ref 8.9–10.3)
Chloride: 107 mmol/L (ref 98–111)
Creatinine, Ser: 1.04 mg/dL — ABNORMAL HIGH (ref 0.44–1.00)
GFR, Estimated: 58 mL/min — ABNORMAL LOW (ref >60)
Glucose, Bld: 127 mg/dL — ABNORMAL HIGH (ref 70–99)
Potassium: 5 mmol/L (ref 3.5–5.1)
Sodium: 141 mmol/L (ref 135–145)

## 2022-12-13 MED ORDER — ACETAMINOPHEN 325 MG PO TABS
650.0000 mg | ORAL_TABLET | Freq: Once | ORAL | Status: AC
  Administered 2022-12-13: 650 mg via ORAL
  Filled 2022-12-13: qty 2

## 2022-12-13 MED ORDER — CEFDINIR 300 MG PO CAPS: 300.0000 mg | ORAL_CAPSULE | Freq: Two times a day (BID) | ORAL | 0 refills | Status: AC

## 2022-12-13 MED ORDER — SODIUM CHLORIDE 0.9 % IV SOLN
1.0000 g | INTRAVENOUS | Status: DC
  Administered 2022-12-13: 1 g via INTRAVENOUS
  Filled 2022-12-13: qty 10

## 2022-12-13 NOTE — Discharge Instructions (Signed)
Your CT scans and x-ray do not show any acute injuries.  You have a urinary tract infection.  You are given a dose of IV antibiotics in the emergency department.  Please take the antibiotics as prescribed for the next week.  Your COVID test is negative.  Please return for any new, worsening, or changing symptoms or other concerns.  It was a pleasure caring for you today.

## 2022-12-13 NOTE — ED Provider Notes (Signed)
[facility] Provider Note    Event Date/Time   First MD Initiated Contact with Patient 12/13/22 [phone]     (approximate)   History   Fall   HPI  Shelly Freeman is a 72 y.o. female with a past medical history of hypertension, depression, anxiety, left total knee replacement, COPD who presents today for evaluation after a fall.  Patient reports that she got out of bed this morning to use the bathroom and felt lightheaded which happens to her very often.  She reports that she fell forward onto her hands and knees, and also hit her head.  She denies loss of consciousness.  She called EMS who were able to get her back up on the stretcher.  She reports that she has pain in her right knee, but reports that she has pain in her right knee every day.  No vomiting.  No other injury sustained.  She denies chest pain, shortness of breath, abdominal pain.  She does not currently have any dizziness.  Patient Active Problem List   Diagnosis Date Noted   Cystitis 09/13/2022   History of ESBL E. 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   Bilateral pneumonia 08/29/2020   AKI (acute kidney injury) (HCC) 08/29/2020   COPD (chronic obstructive pulmonary disease) (HCC) 08/29/2020   Fall at home, initial encounter 08/29/2020   CAP (community acquired pneumonia) 08/29/2020   Osteoarthritis of left knee 10/01/2019   Diarrhea 09/17/2019   Dizziness 09/17/2019   Other  fatigue 06/09/2019   Shortness of breath 06/09/2019   Obesity, Class III, BMI 40-49.9 (morbid obesity) (HCC) 05/23/2019   UTI (urinary tract infection) 05/23/2019   Right rib fracture 05/23/2019   SIRS (systemic inflammatory response syndrome) (HCC) 05/23/2019   Bacteremia due to Gram-negative bacteria 05/23/2019   Chest pain at rest 05/19/2019   Pain in rib 05/19/2019   Acute bronchiolitis 01/28/2019   Iron deficiency anemia due to chronic blood loss 10/29/2018   Primary osteoarthritis involving multiple joints 10/29/2018   Right carpal tunnel syndrome 07/31/2017   Cervical radiculopathy 07/16/2017   Left anterior knee pain 10/24/2016   Right upper quadrant pain 10/24/2016   Urination pain 06/26/2016   DDD (degenerative disc disease), cervical 05/14/2016   Lipoma of skin 05/14/2016   Vitamin D deficiency 05/14/2016   Major neurocognitive disorder, due to vascular disease, with behavioral disturbance, mild (HCC) 04/15/2016   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/04/2016   Lupus (HCC) 04/04/2016   GERD (gastroesophageal reflux disease) 04/04/2016   COPD with acute bronchitis (HCC) 04/04/2016   Acute respiratory failure with hypoxia (HCC) 04/03/2016   Encephalopathy, metabolic 04/03/2016   Hypokalemia 04/03/2016   Pain in limb 04/03/2016   Hypothyroidism 04/03/2016   Essential hypertension 04/03/2016   Intentional opiate overdose (HCC) 03/30/2016   Age related osteoporosis 03/19/2016   Right arm pain 03/19/2016   Encounter for long-term (current) use of high-risk medication 03/19/2016   Primary osteoarthritis of both knees 03/19/2016  Physical Exam   Triage Vital Signs: ED Triage Vitals  Encounter Vitals Group     BP      Systolic BP Percentile      Diastolic BP Percentile      Pulse      Resp      Temp      Temp src      SpO2      Weight      Height      Head Circumference      Peak Flow      Pain Score      Pain Loc       Pain Education      Exclude from Growth Chart     Most recent vital signs: Vitals:   12/13/22 0746  BP: (!) 129/55  Pulse: 94  Resp: 20  Temp: 99.4 F (37.4 C)  SpO2: 93%    Physical Exam Vitals and nursing note reviewed.  Constitutional:      General: Awake and alert. No acute distress.    Appearance: Normal appearance. The patient is obese.  HENT:     Head: Normocephalic and atraumatic.     Mouth: Mucous membranes are moist.  Eyes:     General: PERRL. Normal EOMs        Right eye: No discharge.        Left eye: No discharge.     Conjunctiva/sclera: Conjunctivae normal.  Cardiovascular:     Rate and Rhythm: Normal rate and regular rhythm.     Pulses: Normal pulses.  Pulmonary:     Effort: Pulmonary effort is normal. No respiratory distress.     Breath sounds: Normal breath sounds.  Abdominal:     Abdomen is soft. There is no abdominal tenderness. No rebound or guarding. No distention. No CVAT Musculoskeletal:        General: No swelling. Normal range of motion.     Cervical back: Normal range of motion and neck supple. No midline cervical spine tenderness.  Full range of motion of neck.  Negative Spurling test.  Negative Lhermitte sign.  Normal strength and sensation in bilateral upper extremities. Normal grip strength bilaterally.  Normal intrinsic muscle function of the hand bilaterally.  Normal radial pulses bilaterally. Right knee: No deformity or rash. No joint line tenderness. No patellar tenderness, no ballotment Warm and well perfused extremity with 2+ pedal pulses 5/5 strength to dorsiflexion and plantarflexion at the ankle with intact sensation throughout extremity Normal range of motion of the knee, with intact flexion and extension to active and passive range of motion. Extensor mechanism intact. No ligamentous laxity. Negative anterior/posterior drawer/negative lachman, negative mcmurrays No effusion or warmth Intact quadriceps, hamstring function, patellar  tendon function Pelvis stable Full ROM of ankle without pain or swelling Foot warm and well perfused Skin:    General: Skin is warm and dry.     Capillary Refill: Capillary refill takes less than 2 seconds.     Findings: No rash.  Neurological:     Mental Status: The patient is awake and alert.   Neurological: GCS 15 alert and oriented x3 Normal speech, no expressive or receptive aphasia or dysarthria Cranial nerves II through XII intact Normal visual fields 5 out of 5 strength in all 4 extremities with intact sensation throughout No extremity drift Normal finger-to-nose testing, no limb or truncal ataxia   ED Results / Procedures / Treatments   Labs (all labs ordered are listed, but only abnormal results are  displayed) Labs Reviewed  CBC WITH DIFFERENTIAL/PLATELET - Abnormal; Notable for the following components:      Result Value   WBC 15.9 (*)    RDW 15.6 (*)    Neutro Abs 14.2 (*)    Abs Immature Granulocytes 0.12 (*)    All other components within normal limits  BASIC METABOLIC PANEL - Abnormal; Notable for the following components:   Glucose, Bld 127 (*)    Creatinine, Ser 1.04 (*)    GFR, Estimated 58 (*)    All other components within normal limits  URINALYSIS, ROUTINE W REFLEX MICROSCOPIC - Abnormal; Notable for the following components:   Color, Urine YELLOW (*)    APPearance CLOUDY (*)    Protein, ur 30 (*)    Leukocytes,Ua LARGE (*)    Bacteria, UA RARE (*)    All other components within normal limits  SARS CORONAVIRUS 2 BY RT PCR     EKG     RADIOLOGY I independently reviewed and interpreted imaging and agree with radiologists findings.     PROCEDURES:  Critical Care performed:   Procedures   MEDICATIONS ORDERED IN ED: Medications  cefTRIAXone (ROCEPHIN) 1 g in sodium chloride 0.9 % 100 mL IVPB (0 g Intravenous Stopped 12/13/22 1026)  acetaminophen (TYLENOL) tablet 650 mg (650 mg Oral Given 12/13/22 0834)     IMPRESSION / MDM /  ASSESSMENT AND PLAN / ED COURSE  I reviewed the triage vital signs and the nursing notes.   Differential diagnosis includes, but is not limited to, contusion, concussion, intracranial hemorrhage, cervical spine injury, knee contusion, knee fracture, sprain, urinary tract infection, COVID-19, arrhythmia, electrolyte disarray, dehydration.  Patient is awake and alert, hemodynamically stable and afebrile.  She has nontoxic in appearance.  Further workup as indicated.  EKG obtained given her complaint of lightheadedness at the time she called EMS and demonstrated no acute ischemic signs or changes, no arrhythmias, normal intervals.  Labs are revealing for leukocytosis to 15, patient has urinalysis that is highly suggestive of a urinary tract infection which may explain/contribute to her lightheadedness this morning.  She was given a dose of Rocephin in addition to a prescription for oral antibiotics.  She has no fever or CVA tenderness to suggest pyelonephritis.  COVID swab obtained as well as this was another possible etiology of her feeling lightheaded/weak this morning, this was negative.  CT head and neck obtained per [place] criteria and are negative for any acute findings.  X-ray of her knee obtained given her complaint of knee pain that she feels every day reveals degenerative changes only without acute fracture or dislocation.  She is neurovascularly intact.  She has full and normal range of motion with active and passive range of motion of her knee.  She has no hip tenderness, negative logroll the hips bilaterally.  We discussed return precautions and the importance of close outpatient follow-up.  I also discussed with Lynette from [place].  Patient understands and agrees with plan.  She was discharged in stable condition.  Spanish interpreter was used for the entirety of the history, physical exam, discussion of workup, results, recommendations, and return precautions.   Patient's presentation  is most consistent with acute complicated illness / injury requiring diagnostic workup.    FINAL CLINICAL IMPRESSION(S) / ED DIAGNOSES   Final diagnoses:  Fall, initial encounter  Acute cystitis without hematuria  Injury of head, initial encounter  Contusion of right knee, initial encounter     Rx / DC Orders  ED Discharge Orders          Ordered    cefdinir (OMNICEF) 300 MG capsule  2 times daily        12/13/22 4132             Note:  This document was prepared using Dragon voice recognition software and may include unintentional dictation errors.   Jackelyn Hoehn, PA-C 12/13/22 1225    Trinna Post, MD 12/14/22 [phone]

## 2022-12-13 NOTE — ED Triage Notes (Signed)
Presents via EMS from home  States she tried to get up and got dizzy    fell  hitting her head and both knees  Pre EMS her b/p was 90/58 at scene

## 2022-12-24 ENCOUNTER — Encounter: Payer: Self-pay | Admitting: Urology

## 2022-12-24 ENCOUNTER — Ambulatory Visit (INDEPENDENT_AMBULATORY_CARE_PROVIDER_SITE_OTHER): Payer: Medicare (Managed Care) | Admitting: Urology

## 2022-12-24 VITALS — BP 141/76 | HR 68 | Ht 61.0 in | Wt 221.0 lb

## 2022-12-24 DIAGNOSIS — N3946 Mixed incontinence: Secondary | ICD-10-CM | POA: Diagnosis not present

## 2022-12-24 LAB — URINALYSIS, COMPLETE
Bilirubin, UA: NEGATIVE
Glucose, UA: NEGATIVE
Ketones, UA: NEGATIVE
Nitrite, UA: NEGATIVE
Protein,UA: NEGATIVE
RBC, UA: NEGATIVE
Specific Gravity, UA: 1.02 (ref 1.005–1.030)
Urobilinogen, Ur: 0.2 mg/dL (ref 0.2–1.0)
pH, UA: 5.5 (ref 5.0–7.5)

## 2022-12-24 LAB — MICROSCOPIC EXAMINATION: Epithelial Cells (non renal): >10 /hpf — AB (ref 0–10)

## 2022-12-24 MED ORDER — TRIMETHOPRIM 100 MG PO TABS: 100.0000 mg | ORAL_TABLET | Freq: Every day | ORAL | 1 refills | Status: DC

## 2022-12-24 NOTE — Progress Notes (Signed)
12/24/2022 10:27 AM   Shelly Freeman 05-09-1950 [id number]  Referring provider: Unice Bailey, MD [address] [place],  [place] [address]  Chief Complaint  Patient presents with   Follow-up   Urinary Incontinence    HPI: SN: Patient seen by my partner for recurrent urinary tract infections and right-sided hydronephrosis with no stone.  20 years ago had a percutaneous procedure left kidney.  Has had a number lithotripsies saw a nurse practitioner and patient in May 2024 had pyelonephritis with positive culture.  The right sided hydronephrosis apparently waxes and wanes.  CT scan in March 2024 showed prominence of right collecting system with urothelial enhancement and right perinephric stranding and thickening of bladder in keeping with cystitis and ascending urinary tract infection.  She was given vaginal estrogen cream in May 2024.  Had 2 positive urine cultures in 2024.  Difficult historian even with translator.  Postvoid residual 27 mL.  Has milder frequency and nocturia.  Was on oxybutynin and Myrbetriq and the dose the Myrbetriq increased in July 2024.  A renal ultrasound in July 2024 demonstrated no hydronephrosis.  Patient had an abnormality on the CT scan in May 2024 near the vaginal cuff and is being followed by Dr. Dalbert Garnet.  2 with the last 3 urine cultures have been positive  Today Patient orts urge incontinence.  She leaks a modest amount with coughing sneezing bending and lifting.  I asked the translator to have her concentrate on each question carefully but she tends to have a positive functional inquiry.  The first thing she said is that she wanted a bladder lift when I came into the room.  She does have high-volume bedwetting.  She soaks 6 pads a day  She voids every 1 hour and cannot hold it for 2 hours.  She gets up 4-5 times a night.  She is on oxybutynin.  She stopped the Myrbetriq because she said it did not work  I do not think she has been treated for recent  bladder infection in the last few weeks.  She does not think she is infected today.  She uses a walker for her knee.  She has had a hysterectomy.  No neurologic issues   PMH: Past Medical History:  Diagnosis Date   Anginal pain (HCC)    Arthritis    Collagen vascular disease (HCC)    COPD (chronic obstructive pulmonary disease) (HCC)    DDD (degenerative disc disease), lumbar 05/14/2016   Depression    Dizziness    Dyspnea    GERD (gastroesophageal reflux disease)    Heart valve problem    per grandaughter need surgery but they told her may not survive;   History of kidney stones    Hypertension    Hypothyroidism    Lupus (HCC)    Neuromuscular disorder (HCC)    Sleep apnea    no CPAP   Thyroid disease    Vitamin D deficiency 05/14/2016    Surgical History: Past Surgical History:  Procedure Laterality Date   APPENDECTOMY     BREAST BIOPSY Left    neg   CATARACT EXTRACTION W/PHACO Left 11/13/2021   Procedure: CATARACT EXTRACTION PHACO AND INTRAOCULAR LENS PLACEMENT (IOC) LEFT 3.63 00:35.9;  Surgeon: Nevada Crane, MD;  Location: [facility];  Service: Ophthalmology;  Laterality: Left;   CATARACT EXTRACTION W/PHACO Right 12/04/2021   Procedure: CATARACT EXTRACTION PHACO AND INTRAOCULAR LENS PLACEMENT (IOC) RIGHT;  Surgeon: Nevada Crane, MD;  Location: [facility];  Service: Ophthalmology;  Laterality: Right;  3.11 00:32.6   CESAREAN SECTION     x 2   COLONOSCOPY N/A 08/24/2021   Procedure: COLONOSCOPY;  Surgeon: Jaynie Collins, DO;  Location: [facility];  Service: Gastroenterology;  Laterality: N/A;  SPANISH INTERPRETER   COLONOSCOPY WITH PROPOFOL N/A 07/01/2017   Procedure: COLONOSCOPY WITH PROPOFOL;  Surgeon: Scot Jun, MD;  Location: [facility] ENDOSCOPY;  Service: Endoscopy;  Laterality: N/A;   ESOPHAGOGASTRODUODENOSCOPY (EGD) WITH PROPOFOL N/A 07/01/2017   Procedure: ESOPHAGOGASTRODUODENOSCOPY (EGD) WITH PROPOFOL;  Surgeon:  Scot Jun, MD;  Location: [facility] ENDOSCOPY;  Service: Endoscopy;  Laterality: N/A;   GASTRIC BYPASS     LIPOMA EXCISION Left 06/14/2022   Procedure: EXCISION LIPOMA, left chest wall;  Surgeon: Leafy Ro, MD;  Location: [facility] ORS;  Service: General;  Laterality: Left;   LITHOTRIPSY     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   Procedure: TOTAL KNEE ARTHROPLASTY;  Surgeon: Kennedy Bucker, MD;  Location: [facility] ORS;  Service: Orthopedics;  Laterality: Left;   TUBAL LIGATION      Home Medications:  Allergies as of 12/24/2022       Reactions   Cholestyramine Other (See Comments)   severe        Medication List        Accurate as of December 24, 2022 10:27 AM. If you have any questions, ask your nurse or doctor.          acetaminophen 650 MG CR tablet Commonly known as: TYLENOL Take 650 mg by mouth 3 (three) times daily as needed for pain.   albuterol 108 (90 Base) MCG/ACT inhaler Commonly known as: VENTOLIN HFA Inhale into the lungs. Inhale 2 puffs into the lungs every 4 (four) hours as needed for wheezing or shortness of breath.   alendronate 70 MG tablet Commonly known as: FOSAMAX Take 70 mg by mouth once a week. Take with a full glass of water on an empty stomach.   busPIRone 15 MG tablet Commonly known as: BUSPAR Take 15 mg by mouth 2 (two) times daily.   calcium carbonate 750 MG chewable tablet Commonly known as: TUMS EX Chew 1 tablet by mouth 4 (four) times daily as needed for heartburn.   celecoxib 100 MG capsule Commonly known as: CELEBREX Take 1 capsule by mouth 2 (two) times daily.   cetirizine 10 MG tablet Commonly known as: ZYRTEC Take 10 mg by mouth daily.   cholecalciferol 25 MCG (1000 UNIT) tablet Commonly known as: VITAMIN D3 Take 1,000 Units by mouth daily.   cyanocobalamin 1000 MCG tablet Commonly known as: VITAMIN B12 Take 1,000 mcg by mouth daily.   diclofenac Sodium 1 % Gel Commonly known as: VOLTAREN Apply 2 g topically 4 (four)  times daily.   escitalopram 20 MG tablet Commonly known as: LEXAPRO Take 20 mg by mouth daily.   estradiol 0.1 MG/GM vaginal cream Commonly known as: ESTRACE Place 0.5 g vaginally daily. Insert 1/2 gram into vagina every night as directed for two weeks then use twice weekly on Monday and Friday nights only   famotidine 20 MG tablet Commonly known as: PEPCID Take 20 mg by mouth 2 (two) times daily.   ferrous sulfate 325 (65 FE) MG tablet Take 325 mg by mouth every Monday, Wednesday, and Friday.   fluticasone 50 MCG/ACT nasal spray Commonly known as: FLONASE Place into both nostrils daily.   folic acid 1 MG tablet Commonly known as: FOLVITE Take 1 mg by mouth daily.  gabapentin 600 MG tablet Commonly known as: NEURONTIN Take 600 mg by mouth 3 (three) times daily.   hydrocortisone cream 1 % Apply 1 application topically 3 (three) times daily as needed for itching.   ipratropium-albuterol 0.5-2.5 (3) MG/3ML Soln Commonly known as: DUONEB Take 3 mLs by nebulization every 6 (six) hours as needed (shortness of breath).   levothyroxine 75 MCG tablet Commonly known as: SYNTHROID Take 75 mcg by mouth daily before breakfast.   lidocaine 5 % Commonly known as: LIDODERM Place 1 patch onto the skin daily. Remove & Discard patch within 12 hours or as directed by MD   liver oil-zinc oxide 40 % ointment Commonly known as: DESITIN Apply 1 Application topically as needed for irritation.   Metamucil 0.36 g Caps Generic drug: Psyllium Take 0.4 g by mouth daily. Take 4 capsules by mouth daily   methenamine 1 g tablet Commonly known as: HIPREX Take 1 g by mouth daily.   metoprolol succinate 25 MG 24 hr tablet Commonly known as: TOPROL-XL Take 25 mg by mouth daily.   mirabegron ER 50 MG Tb24 tablet Commonly known as: MYRBETRIQ Take 1 tablet (50 mg total) by mouth daily.   nystatin powder Apply 1 Application topically 2 (two) times daily.   oxybutynin 5 MG 24 hr  tablet Commonly known as: DITROPAN-XL Take 5 mg by mouth in the morning and at bedtime.   pantoprazole 20 MG tablet Commonly known as: PROTONIX Take 20 mg by mouth daily.   Pataday 0.2 % Soln Generic drug: Olopatadine HCl Apply to eye daily as needed.   QUEtiapine 50 MG tablet Commonly known as: SEROQUEL Take 50 mg by mouth at bedtime.   traMADol 50 MG tablet Commonly known as: Ultram Take 1 tablet (50 mg total) by mouth every 6 (six) hours as needed. What changed:  when to take this reasons to take this   traZODone 100 MG tablet Commonly known as: DESYREL Take 125 mg by mouth at bedtime.   traZODone 50 MG tablet Commonly known as: DESYREL Take 25 mg by mouth at bedtime as needed for sleep.        Allergies:  Allergies  Allergen Reactions   Cholestyramine Other (See Comments)    severe    Family History: Family History  Problem Relation Age of Onset   Breast cancer Maternal Aunt    Breast cancer Cousin     Social History:  reports that she has never smoked. She has never been exposed to tobacco smoke. She has never used smokeless tobacco. She reports that she does not drink alcohol and does not use drugs.  ROS:                                        Physical Exam: There were no vitals taken for this visit.  Constitutional:  Alert and oriented, No acute distress. HEENT: [place] AT, moist mucus membranes.  Trachea midline, no masses.   Laboratory Data: Lab Results  Component Value Date   WBC 15.9 (H) 12/13/2022   HGB 12.5 12/13/2022   HCT 39.9 12/13/2022   MCV 95.9 12/13/2022   PLT 171 12/13/2022    Lab Results  Component Value Date   CREATININE 1.04 (H) 12/13/2022    No results found for: "PSA"  No results found for: "TESTOSTERONE"  Lab Results  Component Value Date   HGBA1C 5.6 04/04/2016  Urinalysis    Component Value Date/Time   COLORURINE YELLOW (A) 12/13/2022 0833   APPEARANCEUR CLOUDY (A) 12/13/2022 0833    APPEARANCEUR Hazy (A) 11/09/2022 0958   LABSPEC 1.012 12/13/2022 0833   PHURINE 8.0 12/13/2022 0833   GLUCOSEU NEGATIVE 12/13/2022 0833   HGBUR NEGATIVE 12/13/2022 0833   BILIRUBINUR NEGATIVE 12/13/2022 0833   BILIRUBINUR Negative 11/09/2022 0958   KETONESUR NEGATIVE 12/13/2022 0833   PROTEINUR 30 (A) 12/13/2022 0833   NITRITE NEGATIVE 12/13/2022 0833   LEUKOCYTESUR LARGE (A) 12/13/2022 4782    Pertinent Imaging: Urine positive and sent for culture.  Chart reviewed  Assessment & Plan: The patient has mixed incontinence and high-volume bedwetting.  Likely the overactive bladder component is very significant.  It is important to control her urinary tract infections before we can help her incontinence.  She has had a lot of inflammation of her bladder as well as her right kidney radiographically.  She will come back for pelvic examination and cystoscopy.  I nicely told her that we need to control infections before we control incontinence and that we will likely need further testing in the future by did not discuss urodynamics specifically; and that immediately performing a bladder suspension is not her short-term treatment pathway.  Call if culture positive.  Trimethoprim 100 mg 30 x 11 sent   It turned out the patient may also been on Vesicare so I discontinued it and kept her only on oxybutynin.  Is difficult to say if any the medications gave false negatives because she has been infected so much over the last many months.  I again she had to be reminded although she has been incontinent for 2 years we need to go through a stepwise approach  There are no diagnoses linked to this encounter.  No follow-ups on file.  Martina Sinner, MD  [facility] [address] [place], [place] [address] [phone]

## 2022-12-24 NOTE — Patient Instructions (Signed)

## 2022-12-27 LAB — CULTURE, URINE COMPREHENSIVE

## 2023-02-09 ENCOUNTER — Inpatient Hospital Stay
Admission: EM | Admit: 2023-02-09 | Discharge: 2023-02-14 | DRG: 552 | Disposition: A | Discharge status: Skilled Nursing Facility | Payer: Medicare (Managed Care) | Attending: Internal Medicine | Admitting: Internal Medicine

## 2023-02-09 ENCOUNTER — Emergency Department: Payer: Medicare (Managed Care)

## 2023-02-09 DIAGNOSIS — Z6841 Body Mass Index (BMI) 40.0 and over, adult: Secondary | ICD-10-CM

## 2023-02-09 DIAGNOSIS — E875 Hyperkalemia: Secondary | ICD-10-CM | POA: Diagnosis present

## 2023-02-09 DIAGNOSIS — Z79899 Other long term (current) drug therapy: Secondary | ICD-10-CM

## 2023-02-09 DIAGNOSIS — Z96652 Presence of left artificial knee joint: Secondary | ICD-10-CM | POA: Diagnosis present

## 2023-02-09 DIAGNOSIS — Z7951 Long term (current) use of inhaled steroids: Secondary | ICD-10-CM

## 2023-02-09 DIAGNOSIS — E66813 Obesity, class 3: Secondary | ICD-10-CM | POA: Diagnosis present

## 2023-02-09 DIAGNOSIS — Z87442 Personal history of urinary calculi: Secondary | ICD-10-CM | POA: Diagnosis present

## 2023-02-09 DIAGNOSIS — M5416 Radiculopathy, lumbar region: Secondary | ICD-10-CM | POA: Diagnosis not present

## 2023-02-09 DIAGNOSIS — R52 Pain, unspecified: Secondary | ICD-10-CM | POA: Diagnosis present

## 2023-02-09 DIAGNOSIS — Z79890 Hormone replacement therapy (postmenopausal): Secondary | ICD-10-CM

## 2023-02-09 DIAGNOSIS — Z7983 Long term (current) use of bisphosphonates: Secondary | ICD-10-CM

## 2023-02-09 DIAGNOSIS — G894 Chronic pain syndrome: Secondary | ICD-10-CM | POA: Diagnosis present

## 2023-02-09 DIAGNOSIS — I952 Hypotension due to drugs: Secondary | ICD-10-CM | POA: Diagnosis not present

## 2023-02-09 DIAGNOSIS — E039 Hypothyroidism, unspecified: Secondary | ICD-10-CM | POA: Diagnosis present

## 2023-02-09 DIAGNOSIS — G4730 Sleep apnea, unspecified: Secondary | ICD-10-CM | POA: Diagnosis present

## 2023-02-09 DIAGNOSIS — M4807 Spinal stenosis, lumbosacral region: Secondary | ICD-10-CM | POA: Diagnosis present

## 2023-02-09 DIAGNOSIS — Z66 Do not resuscitate: Secondary | ICD-10-CM | POA: Diagnosis not present

## 2023-02-09 DIAGNOSIS — Z9884 Bariatric surgery status: Secondary | ICD-10-CM

## 2023-02-09 DIAGNOSIS — Z888 Allergy status to other drugs, medicaments and biological substances status: Secondary | ICD-10-CM

## 2023-02-09 DIAGNOSIS — Z87440 Personal history of urinary (tract) infections: Secondary | ICD-10-CM

## 2023-02-09 DIAGNOSIS — N179 Acute kidney failure, unspecified: Secondary | ICD-10-CM | POA: Diagnosis present

## 2023-02-09 DIAGNOSIS — Z803 Family history of malignant neoplasm of breast: Secondary | ICD-10-CM

## 2023-02-09 DIAGNOSIS — K219 Gastro-esophageal reflux disease without esophagitis: Secondary | ICD-10-CM | POA: Diagnosis present

## 2023-02-09 DIAGNOSIS — R300 Dysuria: Secondary | ICD-10-CM | POA: Diagnosis present

## 2023-02-09 DIAGNOSIS — F333 Major depressive disorder, recurrent, severe with psychotic symptoms: Secondary | ICD-10-CM | POA: Diagnosis present

## 2023-02-09 DIAGNOSIS — G4733 Obstructive sleep apnea (adult) (pediatric): Secondary | ICD-10-CM | POA: Diagnosis present

## 2023-02-09 DIAGNOSIS — M329 Systemic lupus erythematosus, unspecified: Secondary | ICD-10-CM | POA: Diagnosis present

## 2023-02-09 DIAGNOSIS — K5900 Constipation, unspecified: Secondary | ICD-10-CM | POA: Diagnosis present

## 2023-02-09 DIAGNOSIS — J449 Chronic obstructive pulmonary disease, unspecified: Secondary | ICD-10-CM | POA: Diagnosis present

## 2023-02-09 DIAGNOSIS — I10 Essential (primary) hypertension: Secondary | ICD-10-CM | POA: Diagnosis present

## 2023-02-09 DIAGNOSIS — R109 Unspecified abdominal pain: Principal | ICD-10-CM | POA: Diagnosis present

## 2023-02-09 DIAGNOSIS — E785 Hyperlipidemia, unspecified: Secondary | ICD-10-CM | POA: Diagnosis present

## 2023-02-09 LAB — CBC
HCT: 41.6 % (ref 36.0–46.0)
Hemoglobin: 12.8 g/dL (ref 12.0–15.0)
MCH: 29.3 pg (ref 26.0–34.0)
MCHC: 30.8 g/dL (ref 30.0–36.0)
MCV: 95.2 fL (ref 80.0–100.0)
Platelets: 201 10*3/uL (ref 150–400)
RBC: 4.37 MIL/uL (ref 3.87–5.11)
RDW: 14.2 % (ref 11.5–15.5)
WBC: 7.8 10*3/uL (ref 4.0–10.5)
nRBC: 0 % (ref 0.0–0.2)

## 2023-02-09 LAB — BASIC METABOLIC PANEL
Anion gap: 9 (ref 5–15)
BUN: 22 mg/dL (ref 8–23)
CO2: 24 mmol/L (ref 22–32)
Calcium: 9.7 mg/dL (ref 8.9–10.3)
Chloride: 106 mmol/L (ref 98–111)
Creatinine, Ser: 1.36 mg/dL — ABNORMAL HIGH (ref 0.44–1.00)
GFR, Estimated: 42 mL/min — ABNORMAL LOW (ref >60)
Glucose, Bld: 88 mg/dL (ref 70–99)
Potassium: 5.4 mmol/L — ABNORMAL HIGH (ref 3.5–5.1)
Sodium: 139 mmol/L (ref 135–145)

## 2023-02-09 LAB — URINALYSIS, ROUTINE W REFLEX MICROSCOPIC
Bilirubin Urine: NEGATIVE
Glucose, UA: NEGATIVE mg/dL
Hgb urine dipstick: NEGATIVE
Ketones, ur: NEGATIVE mg/dL
Leukocytes,Ua: NEGATIVE
Nitrite: NEGATIVE
Protein, ur: NEGATIVE mg/dL
Specific Gravity, Urine: 1.017 (ref 1.005–1.030)
pH: 7 (ref 5.0–8.0)

## 2023-02-09 MED ORDER — HYDROMORPHONE HCL 1 MG/ML IJ SOLN
1.0000 mg | Freq: Once | INTRAMUSCULAR | Status: AC
  Administered 2023-02-09: 1 mg via INTRAVENOUS
  Filled 2023-02-09: qty 1

## 2023-02-09 MED ORDER — IOHEXOL 300 MG/ML  SOLN
80.0000 mL | Freq: Once | INTRAMUSCULAR | Status: AC | PRN
  Administered 2023-02-09: 80 mL via INTRAVENOUS

## 2023-02-09 MED ORDER — PREDNISONE 20 MG PO TABS
60.0000 mg | ORAL_TABLET | Freq: Once | ORAL | Status: AC
  Administered 2023-02-10: 60 mg via ORAL
  Filled 2023-02-09: qty 3

## 2023-02-09 MED ORDER — HYDROCODONE-ACETAMINOPHEN 5-325 MG PO TABS
1.0000 | ORAL_TABLET | Freq: Once | ORAL | Status: AC
  Administered 2023-02-09: 1 via ORAL
  Filled 2023-02-09: qty 1

## 2023-02-09 NOTE — ED Notes (Signed)
Pt to MRI

## 2023-02-09 NOTE — ED Triage Notes (Addendum)
WALLEE SPANISH INTERPRETOR used to assist with triage.   Pt to ED via EMS c/o left flank pain X 1 DAY    Recently treated for kidney infected. Completed ABX yesterday. Reports hematuria, painful urination.   Reports hx of kidney stones Last VS : 135/72, 100%RA, HR 71 No medications given by EMS.

## 2023-02-09 NOTE — ED Notes (Signed)
Warm blanket provided,  call  light within reach

## 2023-02-09 NOTE — ED Notes (Signed)
Patient transported to CT 

## 2023-02-09 NOTE — ED Notes (Signed)
This RN to room due to alarm for low oxygen saturation. Pt had fallen asleep and her O2 was maintaining at 85%. When I woke pt up, her O2 improved to 98%.

## 2023-02-09 NOTE — ED Notes (Signed)
Given juice per provider order.

## 2023-02-09 NOTE — ED Notes (Signed)
Pt cleaned up and provided peri care by this NT and [place] NT.

## 2023-02-09 NOTE — ED Provider Notes (Signed)
[facility] Provider Note    Event Date/Time   First MD Initiated Contact with Patient 02/09/23 2029     (approximate)   History   Flank Pain   HPI  Lezly Nordmeyer is a 72 y.o. female with a history of COPD, GERD, hypothyroidism, lupus, chronic pain, and kidney stones who presents with acute onset of left flank pain since yesterday, radiating around from the left flank to her left lower abdomen and groin area.  She does have some associated dysuria and frequency.  She states it feels similar to when she had a kidney infection previously.  She also was recently treated for a UTI and completed antibiotics yesterday.  I reviewed the past medical records.  The patient was admitted to the hospitalist service in May with pyelonephritis.   Physical Exam   Triage Vital Signs: ED Triage Vitals  Encounter Vitals Group     BP 02/09/23 1835 (!) 156/132     Systolic BP Percentile --      Diastolic BP Percentile --      Pulse Rate 02/09/23 1835 72     Resp 02/09/23 1835 (!) 24     Temp 02/09/23 1835 97.7 F (36.5 C)     Temp Source 02/09/23 1835 Oral     SpO2 02/09/23 1835 100 %     Weight 02/09/23 1836 216 lb (98 kg)     Height 02/09/23 1836 5\' 1"  (1.549 m)     Head Circumference --      Peak Flow --      Pain Score 02/09/23 2230 6     Pain Loc --      Pain Education --      Exclude from Growth Chart --     Most recent vital signs: Vitals:   02/10/23 0000 02/10/23 0030  BP: 98/61 (!) 96/54  Pulse: 67 66  Resp:  18  Temp:    SpO2: 92% 91%     General: Alert, uncomfortable appearing, no distress.  CV:  Good peripheral perfusion.  Resp:  Normal effort.  Abd:  Soft with mild left lower quadrant/inguinal tenderness.  No distention.  Other:  Left lumbar paraspinal tenderness.  No midline tenderness.  5/5 motor strength and intact sensation of bilateral lower extremities.  Full range of motion of left hip.   ED Results / Procedures / Treatments    Labs (all labs ordered are listed, but only abnormal results are displayed) Labs Reviewed  URINALYSIS, ROUTINE W REFLEX MICROSCOPIC - Abnormal; Notable for the following components:      Result Value   Color, Urine YELLOW (*)    APPearance CLEAR (*)    All other components within normal limits  BASIC METABOLIC PANEL - Abnormal; Notable for the following components:   Potassium 5.4 (*)    Creatinine, Ser 1.36 (*)    GFR, Estimated 42 (*)    All other components within normal limits  CBC     EKG    RADIOLOGY  CT abdomen/pelvis: I independently viewed and interpreted the images; there are no dilated bowel loops or any free air or free fluid.  Radiology report indicates the  following:  IMPRESSION:  1. No acute abnormality in the abdomen/pelvis. No renal stones or  obstructive uropathy. No CT findings of pyelonephritis.  2. Multifocal renal parenchymal scarring.  3. Mild hepatomegaly.  4. Postsurgical change in the stomach with small hiatal hernia.   MRI lumbar spine:  IMPRESSION:  1. 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  2. Mild narrowing of the left lateral recess at L3-L4 and right  lateral recess at L5-S1. Correlate for left L4 and/or right S1  radiculopathy.  3. No spinal canal stenosis.    PROCEDURES:  Critical Care performed: No  Procedures   MEDICATIONS ORDERED IN ED: Medications  HYDROcodone-acetaminophen (NORCO/VICODIN) 5-325 MG per tablet 1 tablet (1 tablet Oral Given 02/09/23 1847)  iohexol (OMNIPAQUE) 300 MG/ML solution 80 mL (80 mLs Intravenous Contrast Given 02/09/23 1940)  HYDROmorphone (DILAUDID) injection 1 mg (1 mg Intravenous Given 02/09/23 1936)  HYDROmorphone (DILAUDID) injection 1 mg (1 mg Intravenous Given 02/09/23 2224)  predniSONE (DELTASONE) tablet 60 mg (60 mg Oral Given 02/10/23 0012)     IMPRESSION / MDM / ASSESSMENT AND PLAN / ED COURSE  I reviewed  the triage vital signs and the nursing notes.  71 year old female with PMH as noted above presents with left flank and lower abdominal pain since yesterday.  She has been having some urinary symptoms recently and was treated for UTI.  She finished a course of antibiotic yesterday.  Differential diagnosis includes, but is not limited to, UTI/pyelonephritis, ureteral stone, musculoskeletal pain, colitis, diverticulitis, less likely vascular etiology.  Patient's presentation is most consistent with acute presentation with potential threat to life or bodily function.  The patient is on the cardiac monitor to evaluate for evidence of arrhythmia and/or significant heart rate changes.  Lab workup is unremarkable.  BMP shows minimally elevated creatinine.  CBC shows no leukocytosis or anemia.  Urinalysis is negative for acute findings.  CT was obtained and shows no ureteral stone or other acute abnormalities.  I initially considered whether the pain may be due to a vascular etiology although the patient has good distal pulses, no history of vascular disease, and the location of the pain is not consistent with a retroperitoneal hematoma or renal infarct.  Differential also includes radiculopathy or other musculoskeletal pain.  After discussion with the patient, we we will obtain an MRI of the lumbar spine for further evaluation.  ----------------------------------------- 12:57 AM on 02/10/2023 -----------------------------------------  MRI shows foraminal stenosis and findings consistent with acute radiculopathy.  The patient's pain is somewhat improved at this point.  She has no neurodeficits.  There is no indication for emergent neurosurgery consultation.  I have added on prednisone.  I consulted Dr. Para March from the hospitalist service; based on our discussion she agrees to evaluate the patient for admission.   FINAL CLINICAL IMPRESSION(S) / ED DIAGNOSES   Final diagnoses:  Left flank pain  Lumbar  radiculopathy     Rx / DC Orders   ED Discharge Orders     None        Note:  This document was prepared using Dragon voice recognition software and may include unintentional dictation errors.    Dionne Bucy, MD 02/10/23 [phone]

## 2023-02-10 DIAGNOSIS — G4733 Obstructive sleep apnea (adult) (pediatric): Secondary | ICD-10-CM | POA: Diagnosis present

## 2023-02-10 DIAGNOSIS — G4730 Sleep apnea, unspecified: Secondary | ICD-10-CM | POA: Diagnosis present

## 2023-02-10 DIAGNOSIS — N179 Acute kidney failure, unspecified: Secondary | ICD-10-CM | POA: Diagnosis present

## 2023-02-10 DIAGNOSIS — J449 Chronic obstructive pulmonary disease, unspecified: Secondary | ICD-10-CM

## 2023-02-10 DIAGNOSIS — F333 Major depressive disorder, recurrent, severe with psychotic symptoms: Secondary | ICD-10-CM | POA: Diagnosis present

## 2023-02-10 DIAGNOSIS — G894 Chronic pain syndrome: Secondary | ICD-10-CM | POA: Diagnosis present

## 2023-02-10 DIAGNOSIS — Z87442 Personal history of urinary calculi: Secondary | ICD-10-CM | POA: Diagnosis present

## 2023-02-10 DIAGNOSIS — E66813 Obesity, class 3: Secondary | ICD-10-CM | POA: Diagnosis present

## 2023-02-10 DIAGNOSIS — K5900 Constipation, unspecified: Secondary | ICD-10-CM | POA: Diagnosis present

## 2023-02-10 DIAGNOSIS — R52 Pain, unspecified: Secondary | ICD-10-CM | POA: Diagnosis present

## 2023-02-10 DIAGNOSIS — Z7951 Long term (current) use of inhaled steroids: Secondary | ICD-10-CM | POA: Diagnosis not present

## 2023-02-10 DIAGNOSIS — I10 Essential (primary) hypertension: Secondary | ICD-10-CM | POA: Diagnosis present

## 2023-02-10 DIAGNOSIS — M4807 Spinal stenosis, lumbosacral region: Secondary | ICD-10-CM | POA: Diagnosis present

## 2023-02-10 DIAGNOSIS — Z79890 Hormone replacement therapy (postmenopausal): Secondary | ICD-10-CM | POA: Diagnosis not present

## 2023-02-10 DIAGNOSIS — Z6841 Body Mass Index (BMI) 40.0 and over, adult: Secondary | ICD-10-CM | POA: Diagnosis not present

## 2023-02-10 DIAGNOSIS — E039 Hypothyroidism, unspecified: Secondary | ICD-10-CM | POA: Diagnosis present

## 2023-02-10 DIAGNOSIS — Z9884 Bariatric surgery status: Secondary | ICD-10-CM | POA: Diagnosis not present

## 2023-02-10 DIAGNOSIS — I952 Hypotension due to drugs: Secondary | ICD-10-CM

## 2023-02-10 DIAGNOSIS — Z803 Family history of malignant neoplasm of breast: Secondary | ICD-10-CM | POA: Diagnosis not present

## 2023-02-10 DIAGNOSIS — M5416 Radiculopathy, lumbar region: Secondary | ICD-10-CM | POA: Diagnosis present

## 2023-02-10 DIAGNOSIS — M329 Systemic lupus erythematosus, unspecified: Secondary | ICD-10-CM | POA: Diagnosis present

## 2023-02-10 DIAGNOSIS — Z7983 Long term (current) use of bisphosphonates: Secondary | ICD-10-CM | POA: Diagnosis not present

## 2023-02-10 DIAGNOSIS — Z87440 Personal history of urinary (tract) infections: Secondary | ICD-10-CM | POA: Diagnosis not present

## 2023-02-10 DIAGNOSIS — E785 Hyperlipidemia, unspecified: Secondary | ICD-10-CM | POA: Diagnosis present

## 2023-02-10 DIAGNOSIS — E875 Hyperkalemia: Secondary | ICD-10-CM | POA: Diagnosis present

## 2023-02-10 DIAGNOSIS — Z66 Do not resuscitate: Secondary | ICD-10-CM | POA: Diagnosis not present

## 2023-02-10 LAB — BASIC METABOLIC PANEL
Anion gap: 8 (ref 5–15)
BUN: 22 mg/dL (ref 8–23)
CO2: 26 mmol/L (ref 22–32)
Calcium: 9.1 mg/dL (ref 8.9–10.3)
Chloride: 104 mmol/L (ref 98–111)
Creatinine, Ser: 0.93 mg/dL (ref 0.44–1.00)
GFR, Estimated: >60 mL/min (ref >60)
Glucose, Bld: 158 mg/dL — ABNORMAL HIGH (ref 70–99)
Potassium: 5.2 mmol/L — ABNORMAL HIGH (ref 3.5–5.1)
Sodium: 138 mmol/L (ref 135–145)

## 2023-02-10 MED ORDER — QUETIAPINE FUMARATE 25 MG PO TABS
50.0000 mg | ORAL_TABLET | Freq: Every day | ORAL | Status: DC
  Administered 2023-02-10 – 2023-02-13 (×5): 50 mg via ORAL
  Filled 2023-02-10 (×5): qty 2

## 2023-02-10 MED ORDER — ACETAMINOPHEN 650 MG RE SUPP: 650.0000 mg | Freq: Four times a day (QID) | RECTAL | Status: DC | PRN

## 2023-02-10 MED ORDER — IPRATROPIUM-ALBUTEROL 0.5-2.5 (3) MG/3ML IN SOLN: 3.0000 mL | Freq: Four times a day (QID) | RESPIRATORY_TRACT | Status: DC | PRN

## 2023-02-10 MED ORDER — ACETAMINOPHEN 325 MG PO TABS
650.0000 mg | ORAL_TABLET | Freq: Four times a day (QID) | ORAL | Status: DC | PRN
  Filled 2023-02-10: qty 2

## 2023-02-10 MED ORDER — BUSPIRONE HCL 10 MG PO TABS
15.0000 mg | ORAL_TABLET | Freq: Two times a day (BID) | ORAL | Status: DC
  Administered 2023-02-11 – 2023-02-14 (×7): 15 mg via ORAL
  Filled 2023-02-10 (×7): qty 2

## 2023-02-10 MED ORDER — HYDROMORPHONE HCL 1 MG/ML IJ SOLN
0.5000 mg | INTRAMUSCULAR | Status: DC | PRN
  Administered 2023-02-10 (×2): 1 mg via INTRAVENOUS
  Filled 2023-02-10 (×2): qty 1

## 2023-02-10 MED ORDER — FAMOTIDINE 20 MG PO TABS
20.0000 mg | ORAL_TABLET | Freq: Two times a day (BID) | ORAL | Status: DC
  Administered 2023-02-10 – 2023-02-14 (×9): 20 mg via ORAL
  Filled 2023-02-10 (×9): qty 1

## 2023-02-10 MED ORDER — ESCITALOPRAM OXALATE 10 MG PO TABS
20.0000 mg | ORAL_TABLET | Freq: Every day | ORAL | Status: DC
  Administered 2023-02-10 – 2023-02-14 (×5): 20 mg via ORAL
  Filled 2023-02-10 (×5): qty 2

## 2023-02-10 MED ORDER — SODIUM ZIRCONIUM CYCLOSILICATE 5 G PO PACK
5.0000 g | PACK | Freq: Once | ORAL | Status: AC
  Administered 2023-02-10: 5 g via ORAL
  Filled 2023-02-10: qty 1

## 2023-02-10 MED ORDER — METHOCARBAMOL 1000 MG/10ML IJ SOLN
500.0000 mg | Freq: Four times a day (QID) | INTRAVENOUS | Status: DC | PRN
  Administered 2023-02-10 – 2023-02-11 (×2): 500 mg via INTRAVENOUS
  Filled 2023-02-10: qty 500
  Filled 2023-02-10: qty 5

## 2023-02-10 MED ORDER — GABAPENTIN 300 MG PO CAPS
600.0000 mg | ORAL_CAPSULE | Freq: Three times a day (TID) | ORAL | Status: DC
  Administered 2023-02-10 – 2023-02-14 (×13): 600 mg via ORAL
  Filled 2023-02-10 (×13): qty 2

## 2023-02-10 MED ORDER — DEXAMETHASONE SODIUM PHOSPHATE 4 MG/ML IJ SOLN
4.0000 mg | Freq: Three times a day (TID) | INTRAMUSCULAR | Status: DC
  Administered 2023-02-10 – 2023-02-14 (×14): 4 mg via INTRAVENOUS
  Filled 2023-02-10 (×14): qty 1

## 2023-02-10 MED ORDER — ONDANSETRON HCL 4 MG PO TABS: 4.0000 mg | ORAL_TABLET | Freq: Four times a day (QID) | ORAL | Status: DC | PRN

## 2023-02-10 MED ORDER — LEVOTHYROXINE SODIUM 50 MCG PO TABS
100.0000 ug | ORAL_TABLET | Freq: Every day | ORAL | Status: DC
  Administered 2023-02-10 – 2023-02-14 (×5): 100 ug via ORAL
  Filled 2023-02-10 (×3): qty 2
  Filled 2023-02-10: qty 1
  Filled 2023-02-10: qty 2

## 2023-02-10 MED ORDER — TRAZODONE HCL 50 MG PO TABS
125.0000 mg | ORAL_TABLET | Freq: Every day | ORAL | Status: DC
  Administered 2023-02-10 – 2023-02-13 (×5): 125 mg via ORAL
  Filled 2023-02-10 (×5): qty 1

## 2023-02-10 MED ORDER — ONDANSETRON HCL 4 MG/2ML IJ SOLN
4.0000 mg | Freq: Four times a day (QID) | INTRAMUSCULAR | Status: DC | PRN
  Administered 2023-02-10: 4 mg via INTRAVENOUS
  Filled 2023-02-10: qty 2

## 2023-02-10 MED ORDER — TRAZODONE HCL 50 MG PO TABS: 25.0000 mg | ORAL_TABLET | Freq: Every evening | ORAL | Status: DC | PRN

## 2023-02-10 MED ORDER — PANTOPRAZOLE SODIUM 20 MG PO TBEC
20.0000 mg | DELAYED_RELEASE_TABLET | Freq: Every day | ORAL | Status: DC
  Administered 2023-02-10 – 2023-02-14 (×5): 20 mg via ORAL
  Filled 2023-02-10 (×5): qty 1

## 2023-02-10 MED ORDER — OXYCODONE-ACETAMINOPHEN 5-325 MG PO TABS
1.0000 | ORAL_TABLET | ORAL | Status: DC | PRN
  Administered 2023-02-10 – 2023-02-14 (×9): 1 via ORAL
  Filled 2023-02-10 (×10): qty 1

## 2023-02-10 MED ORDER — QUETIAPINE FUMARATE 25 MG PO TABS: 50.0000 mg | ORAL_TABLET | Freq: Every day | ORAL | Status: DC

## 2023-02-10 NOTE — Assessment & Plan Note (Signed)
Continue levothyroxine 

## 2023-02-10 NOTE — Assessment & Plan Note (Signed)
Does not use CPAP. 

## 2023-02-10 NOTE — H&P (Signed)
History and Physical    Patient: Shelly Freeman NGE:[id number] DOB: 02-07-1951 DOA: 02/09/2023 DOS: the patient was seen and examined on 02/10/2023 PCP: Clovis Pu, Arturo Morton, DO  Patient coming from: Home  Chief Complaint:  Chief Complaint  Patient presents with   Flank Pain    HPI: Shelly Freeman is a 72 y.o. female with medical history significant for COPD, GERD, nephrolithiasis hypothyroidism, lupus, depression, chronic pain syndrome hospitalized in May 2024 with right-sided hydroureteronephrosis due to suspected passed calculus who presents to the ED with intractable mid lower back pain, radiating up to the shoulder.  States it started suddenly while she was in bed the night prior.  Since the onset she has been unable to get out of bed on her own or ambulate.  She denies any falls or injuries..  She denies fever or chills.  Denies dysuria.  Denies numbness in the groin area of bladder or bowel incontinence or retention. ED course and data review: On arrival BP 156/132 and tachypneic to 24 with otherwise normal vitals Labs: CBC WNL, BMP notable for creat 0.36 above baseline of 0.86 and potassium 5.4.  Urinalysis unremarkable. CT abdomen and pelvis showed no acute abnormality.  Did show multifocal renal parenchymal scarring Lumbar MRI showed severe spinal stenosis as follows: IMPRESSION: 1. 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 2. Mild narrowing of the left lateral recess at L3-L4 and right lateral recess at L5-S1. Correlate for left L4 and/or right S1 radiculopathy. 3. No spinal canal stenosis.  Patient was treated with several rounds of Dilaudid as well as prednisone but continued to have intractable pain Hospitalist consult for management for pain control.rmal vitals.  BP trended down to 96/54 following treatment with Dilaudid.  Review of Systems: As mentioned in the history of present illness. All  other systems reviewed and are negative.  Past Medical History:  Diagnosis Date   Anginal pain (HCC)    Arthritis    Collagen vascular disease (HCC)    COPD (chronic obstructive pulmonary disease) (HCC)    DDD (degenerative disc disease), lumbar 05/14/2016   Depression    Dizziness    Dyspnea    GERD (gastroesophageal reflux disease)    Heart valve problem    per grandaughter need surgery but they told her may not survive;   History of kidney stones    Hypertension    Hypothyroidism    Lupus    Neuromuscular disorder (HCC)    Sleep apnea    no CPAP   Thyroid disease    Vitamin D deficiency 05/14/2016   Past Surgical History:  Procedure Laterality Date   APPENDECTOMY     BREAST BIOPSY Left    neg   CATARACT EXTRACTION W/PHACO Left 11/13/2021   Procedure: CATARACT EXTRACTION PHACO AND INTRAOCULAR LENS PLACEMENT (IOC) LEFT 3.63 00:35.9;  Surgeon: Nevada Crane, MD;  Location: [facility];  Service: Ophthalmology;  Laterality: Left;   CATARACT EXTRACTION W/PHACO Right 12/04/2021   Procedure: CATARACT EXTRACTION PHACO AND INTRAOCULAR LENS PLACEMENT (IOC) RIGHT;  Surgeon: Nevada Crane, MD;  Location: [facility];  Service: Ophthalmology;  Laterality: Right;  3.11 00:32.6   CESAREAN SECTION     x 2   COLONOSCOPY N/A 08/24/2021   Procedure: COLONOSCOPY;  Surgeon: Jaynie Collins, DO;  Location: [facility] END[facility];  Service: Gastroenterology;  Laterality: N/A;  SPANISH INTERPRETER   COLONOSCOPY WITH PROPOFOL N/A 07/01/2017   Procedure: COLONOSCOPY  WITH PROPOFOL;  Surgeon: Scot Jun, MD;  Location: [facility] END[facility]COPY;  Service: Endoscopy;  Laterality: N/A;   ESOPHAGOGASTRODUODENOSCOPY (EGD) WITH PROPOFOL N/A 07/01/2017   Procedure: ESOPHAGOGASTRODUODENOSCOPY (EGD) WITH PROPOFOL;  Surgeon: Scot Jun, MD;  Location: [facility] ENDOSCOPY;  Service: Endoscopy;  Laterality: N/A;   GASTRIC BYPASS     LIPOMA EXCISION Left 06/14/2022   Procedure: EXCISION  LIPOMA, left chest wall;  Surgeon: Leafy Ro, MD;  Location: [facility] ORS;  Service: General;  Laterality: Left;   LITHOTRIPSY     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   Procedure: TOTAL KNEE ARTHROPLASTY;  Surgeon: Kennedy Bucker, MD;  Location: [facility] ORS;  Service: Orthopedics;  Laterality: Left;   TUBAL LIGATION     Social History:  reports that she has never smoked. She has never been exposed to tobacco smoke. She has never used smokeless tobacco. She reports that she does not drink alcohol and does not use drugs.  Allergies  Allergen Reactions   Cholestyramine Other (See Comments)    severe    Family History  Problem Relation Age of Onset   Breast cancer Maternal Aunt    Breast cancer Cousin     Prior to Admission medications   Medication Sig Start Date End Date Taking? Authorizing Provider  acetaminophen (TYLENOL) 650 MG CR tablet Take 650 mg by mouth 3 (three) times daily as needed for pain. 07/06/22   [provider]  albuterol (VENTOLIN HFA) 108 (90 Base) MCG/ACT inhaler Inhale into the lungs. Inhale 2 puffs into the lungs every 4 (four) hours as needed for wheezing or shortness of breath.    [provider]  alendronate (FOSAMAX) 70 MG tablet Take 70 mg by mouth once a week. Take with a full glass of water on an empty stomach.    [provider]  busPIRone (BUSPAR) 15 MG tablet Take 15 mg by mouth 2 (two) times daily. 12/05/21   [provider]  calcium carbonate (TUMS EX) 750 MG chewable tablet Chew 1 tablet by mouth 4 (four) times daily as needed for heartburn.    [provider]  celecoxib (CELEBREX) 100 MG capsule Take 1 capsule by mouth 2 (two) times daily. 12/14/21   [provider]  cetirizine (ZYRTEC) 10 MG tablet Take 10 mg by mouth daily.    [provider]  cholecalciferol (VITAMIN D3) 25 MCG (1000 UNIT) tablet Take 1,000 Units by mouth daily. 12/05/21   [provider]  cyanocobalamin (VITAMIN B12)  1000 MCG tablet Take 1,000 mcg by mouth daily. 07/06/22   [provider]  diclofenac Sodium (VOLTAREN) 1 % GEL Apply 2 g topically 4 (four) times daily.    [provider]  escitalopram (LEXAPRO) 20 MG tablet Take 20 mg by mouth daily.    [provider]  estradiol (ESTRACE) 0.1 MG/GM vaginal cream Place 0.5 g vaginally daily. Insert 1/2 gram into vagina every night as directed for two weeks then use twice weekly on Monday and Friday nights only 07/19/22   [provider]  famotidine (PEPCID) 20 MG tablet Take 20 mg by mouth 2 (two) times daily.    [provider]  ferrous sulfate 325 (65 FE) MG tablet Take 325 mg by mouth every Monday, Wednesday, and Friday.    [provider]  fluticasone (FLONASE) 50 MCG/ACT nasal spray Place into both nostrils daily.    [provider]  folic acid (FOLVITE) 1 MG tablet Take 1 mg by mouth daily.  [provider]  gabapentin (NEURONTIN) 600 MG tablet Take 600 mg by mouth 3 (three) times daily. 05/07/21   [provider]  hydrocortisone cream 1 % Apply 1 application topically 3 (three) times daily as needed for itching.    [provider]  ipratropium-albuterol (DUONEB) 0.5-2.5 (3) MG/3ML SOLN Take 3 mLs by nebulization every 6 (six) hours as needed (shortness of breath).    [provider]  levothyroxine (SYNTHROID, LEVOTHROID) 75 MCG tablet Take 75 mcg by mouth daily before breakfast.    [provider]  lidocaine (LIDODERM) 5 % Place 1 patch onto the skin daily. Remove & Discard patch within 12 hours or as directed by MD    [provider]  liver oil-zinc oxide (DESITIN) 40 % ointment Apply 1 Application topically as needed for irritation.    [provider]  methenamine (HIPREX) 1 g tablet Take 1 g by mouth daily. 07/19/22   [provider]  metoprolol succinate (TOPROL-XL) 25 MG 24 hr tablet Take 25 mg by mouth daily.     [provider]  nystatin powder Apply 1 Application topically 2 (two) times daily.    [provider]  Olopatadine HCl (PATADAY) 0.2 % SOLN Apply to eye daily as needed.    [provider]  oxybutynin (DITROPAN-XL) 5 MG 24 hr tablet Take 5 mg by mouth in the morning and at bedtime. 12/05/21   [provider]  pantoprazole (PROTONIX) 20 MG tablet Take 20 mg by mouth daily. 08/08/22   [provider]  Psyllium (METAMUCIL) 0.36 g CAPS Take 0.4 g by mouth daily. Take 4 capsules by mouth daily 06/07/22   [provider]  QUEtiapine (SEROQUEL) 50 MG tablet Take 50 mg by mouth at bedtime.    [provider]  traMADol (ULTRAM) 50 MG tablet Take 1 tablet (50 mg total) by mouth every 6 (six) hours as needed. Patient taking differently: Take 50 mg by mouth 3 (three) times daily as needed for moderate pain. 06/14/22 06/14/23  Leafy Ro, MD  traZODone (DESYREL) 100 MG tablet Take 125 mg by mouth at bedtime.    [provider]  traZODone (DESYREL) 50 MG tablet Take 25 mg by mouth at bedtime as needed for sleep. 01/05/21   [provider]  trimethoprim (TRIMPEX) 100 MG tablet Take 1 tablet (100 mg total) by mouth daily. 12/24/22   Alfredo Martinez, MD    Physical Exam: Vitals:   02/09/23 2230 02/09/23 2330 02/10/23 0000 02/10/23 0030  BP: (!) 108/97 (!) 95/43 98/61 (!) 96/54  Pulse: 77 75 67 66  Resp: (!) 22 20  18   Temp:  98 F (36.7 C)    TempSrc:  Oral    SpO2: 92% 98% 92% 91%  Weight:      Height:       Physical Exam Vitals and nursing note reviewed.  Constitutional:      General: She is not in acute distress. HENT:     Head: Normocephalic and atraumatic.  Cardiovascular:     Rate and Rhythm: Normal rate and regular rhythm.     Heart sounds: Normal heart sounds.  Pulmonary:     Effort: Pulmonary effort is normal.     Breath sounds: Normal breath sounds.  Abdominal:     Palpations: Abdomen is soft.     Tenderness: There is  no abdominal tenderness.  Neurological:     Mental Status: Mental status is at baseline.  Labs on Admission: I have personally reviewed following labs and imaging studies  CBC: Recent Labs  Lab 02/09/23 1838  WBC 7.8  HGB 12.8  HCT 41.6  MCV 95.2  PLT 201   Basic Metabolic Panel: Recent Labs  Lab 02/09/23 1838  NA 139  K 5.4*  CL 106  CO2 24  GLUCOSE 88  BUN 22  CREATININE 1.36*  CALCIUM 9.7   GFR: Estimated Creatinine Clearance: 41.3 mL/min (A) (by C-G formula based on SCr of 1.36 mg/dL (H)). Liver Function Tests: No results for input(s): "AST", "ALT", "ALKPHOS", "BILITOT", "PROT", "ALBUMIN" in the last 168 hours. No results for input(s): "LIPASE", "AMYLASE" in the last 168 hours. No results for input(s): "AMMONIA" in the last 168 hours. Coagulation Profile: No results for input(s): "INR", "PROTIME" in the last 168 hours. Cardiac Enzymes: No results for input(s): "CKTOTAL", "CKMB", "CKMBINDEX", "TROPONINI" in the last 168 hours. BNP (last 3 results) No results for input(s): "PROBNP" in the last 8760 hours. HbA1C: No results for input(s): "HGBA1C" in the last 72 hours. CBG: No results for input(s): "GLUCAP" in the last 168 hours. Lipid Profile: No results for input(s): "CHOL", "HDL", "LDLCALC", "TRIG", "CHOLHDL", "LDLDIRECT" in the last 72 hours. Thyroid Function Tests: No results for input(s): "TSH", "T4TOTAL", "FREET4", "T3FREE", "THYROIDAB" in the last 72 hours. Anemia Panel: No results for input(s): "VITAMINB12", "FOLATE", "FERRITIN", "TIBC", "IRON", "RETICCTPCT" in the last 72 hours. Urine analysis:    Component Value Date/Time   COLORURINE YELLOW (A) 02/09/2023 1838   APPEARANCEUR CLEAR (A) 02/09/2023 1838   APPEARANCEUR Clear 12/24/2022 1030   LABSPEC 1.017 02/09/2023 1838   PHURINE 7.0 02/09/2023 1838   GLUCOSEU NEGATIVE 02/09/2023 1838   HGBUR NEGATIVE 02/09/2023 1838   BILIRUBINUR NEGATIVE 02/09/2023 1838   BILIRUBINUR Negative 12/24/2022  1030   KETONESUR NEGATIVE 02/09/2023 1838   PROTEINUR NEGATIVE 02/09/2023 1838   NITRITE NEGATIVE 02/09/2023 1838   LEUKOCYTESUR NEGATIVE 02/09/2023 1838    Radiological Exams on Admission: MR LUMBAR SPINE WO CONTRAST  Result Date: 02/09/2023 CLINICAL DATA:  Lumbar radiculopathy EXAM: MRI LUMBAR SPINE WITHOUT CONTRAST TECHNIQUE: Multiplanar, multisequence MR imaging of the lumbar spine was performed. No intravenous contrast was administered. COMPARISON:  None Available. FINDINGS: Segmentation:  Standard. Alignment:  Physiologic. Vertebrae: No fracture, evidence of discitis, or aggressive bone lesion. There is a hemangioma at T12. Conus medullaris and cauda equina: Conus extends to the L1 level. Conus and cauda equina appear normal. Paraspinal and other soft tissues: Negative. Disc levels: L1-L2: Normal disc space and facet joints. No spinal canal stenosis. No neural foraminal stenosis. L2-L3: Small left subarticular disc protrusion. No spinal canal stenosis. No neural foraminal stenosis. L3-L4: Left asymmetric disc bulge with mild narrowing of the left lateral recess. No spinal canal stenosis. No neural foraminal stenosis. L4-L5: Small central disc protrusion and mild facet arthrosis. No spinal canal stenosis. No neural foraminal stenosis. L5-S1: Right asymmetric disc bulge with narrowing of the right lateral recess. Right-greater-than-left facet arthrosis. No spinal canal stenosis. Severe right and moderate left neural foraminal stenosis. Visualized sacrum: Normal. IMPRESSION: 1. 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 2. Mild narrowing of the left lateral recess at L3-L4 and right lateral recess at L5-S1. Correlate for left L4 and/or right S1 radiculopathy. 3. No spinal canal stenosis. Electronically Signed   By: Deatra Robinson M.D.   On: 02/09/2023 23:28   CT ABDOMEN PELVIS W CONTRAST  Result Date: 02/09/2023 CLINICAL  DATA:  severe left flank pain, recent UTI, concern for pyelonephritis vs kidney stone EXAM: CT ABDOMEN AND PELVIS WITH CONTRAST TECHNIQUE: Multidetector CT imaging of the abdomen and pelvis was performed using the standard protocol following bolus administration of intravenous contrast. RADIATION DOSE REDUCTION: This exam was performed according to the departmental dose-optimization program which includes automated exposure control, adjustment of the mA and/or kV according to patient size and/or use of iterative reconstruction technique. CONTRAST:  80mL OMNIPAQUE IOHEXOL 300 MG/ML  SOLN COMPARISON:  09/13/2022 FINDINGS: Lower chest: No basilar airspace disease or pleural effusion Hepatobiliary: The liver is mildly enlarged spanning 19.5 cm cranial caudal. No focal hepatic abnormality. Gallbladder physiologically distended, no calcified stone. No biliary dilatation. Pancreas: No ductal dilatation or inflammation. Spleen: Normal in size without focal abnormality. Adrenals/Urinary Tract: No adrenal nodule. Multifocal renal parenchymal scarring. No hydronephrosis. No renal calculi. No perinephric inflammation. Homogeneous enhancement with symmetric excretion on delayed phase imaging. Decompressed ureters. Minimally distended urinary bladder, normal for degree of distension. Stomach/Bowel: Postsurgical change in the stomach. Gastric suture line crosses the diaphragmatic hiatus. There is a small hiatal hernia. No bowel obstruction or inflammation. Enteric suture line in the left abdomen. Moderate volume of colonic stool. The appendix is not seen. Vascular/Lymphatic: Normal caliber abdominal aorta. Patent portal vein. No acute vascular findings. No abdominopelvic adenopathy. Reproductive: No mass. Other: No free air or ascites.  No abdominal wall hernia. Musculoskeletal: Lumbar degenerative change, primarily at L5-S1. There are no acute or suspicious osseous abnormalities. IMPRESSION: 1. No acute abnormality in the  abdomen/pelvis. No renal stones or obstructive uropathy. No CT findings of pyelonephritis. 2. Multifocal renal parenchymal scarring. 3. Mild hepatomegaly. 4. Postsurgical change in the stomach with small hiatal hernia. Electronically Signed   By: Narda Rutherford M.D.   On: 02/09/2023 20:53     Data Reviewed: Relevant notes from primary care and specialist visits, past discharge summaries as available in EHR, including Care Everywhere. Prior diagnostic testing as pertinent to current admission diagnoses Updated medications and problem lists for reconciliation ED course, including vitals, labs, imaging, treatment and response to treatment Triage notes, nursing and pharmacy notes and ED provider's notes Notable results as noted in HPI   Assessment and Plan: Lumbar radicular pain, acute on chronic, intractable Chronic pain Multimodal pain control Dexamethasone Consider IR consult in the a.m. for transforaminal steroid injection if persistent pain  History of nephrolithiasis No evidence of nephrolithiasis on imaging  Hypotension due to medication Essential hypertension Patient was hypertensive on admission but had a drop in blood pressure with Dilaudid Monitor blood pressure closely with ongoing pain meds Hold home antihypertensives  COPD (chronic obstructive pulmonary disease) (HCC) Not acutely exacerbated Continue home inhalers with DuoNebs as needed  Hypothyroidism Continue levothyroxine  Sleep apnea Does not use CPAP  Obesity, Class III, BMI 40-49.9 (morbid obesity) (HCC) Complicating factor to overall prognosis and care    DVT prophylaxis: SCD  Consults: none  Advance Care Planning:   Code Status: Prior   Family Communication: none  Disposition Plan: Back to previous home environment  Severity of Illness: The appropriate patient status for this patient is OBSERVATION. Observation status is judged to be reasonable and necessary in order to provide the required  intensity of service to ensure the patient's safety. The patient's presenting symptoms, physical exam findings, and initial radiographic and laboratory data in the context of their medical condition is felt to place them at decreased risk for further clinical deterioration. Furthermore, it is anticipated that the patient  will be medically stable for discharge from the hospital within 2 midnights of admission.   Author: Andris Baumann, MD 02/10/2023 1:01 AM  For on call review [email or web address].

## 2023-02-10 NOTE — Progress Notes (Signed)
OT Cancellation Note  Patient Details Name: Shelly Freeman MRN: [id number] DOB: 1950/08/16   Cancelled Treatment:    Reason Eval/Treat Not Completed: Pain limiting ability to participate. OT orders received, chart reviewed. Discussed with PT - pt limited by severe LBP on eval. Consulted with IR for injection (potentially scheduled on 10/7 for pain relief per MD note). OT will re-attempt when pain is controlled and pt able to participate in functional tasks.   Mansa Willers L. Natavia Sublette, OTR/L  02/10/23, 1:10 PM

## 2023-02-10 NOTE — Assessment & Plan Note (Signed)
Not acutely exacerbated ?Continue home inhalers with DuoNebs as needed ?

## 2023-02-10 NOTE — Assessment & Plan Note (Signed)
No evidence of nephrolithiasis on imaging

## 2023-02-10 NOTE — ED Notes (Signed)
Spanish interpreter used [phone]

## 2023-02-10 NOTE — Assessment & Plan Note (Addendum)
Chronic pain Multimodal pain control Dexamethasone Consider IR consult in the a.m. for transforaminal steroid injection if persistent pain

## 2023-02-10 NOTE — Assessment & Plan Note (Addendum)
Essential hypertension Patient was hypertensive on admission but had a drop in blood pressure with Dilaudid Monitor blood pressure closely with ongoing pain meds Hold home antihypertensives

## 2023-02-10 NOTE — ED Notes (Signed)
RN called lab, and they stated they will send someone to come and collect lab work on patient

## 2023-02-10 NOTE — Assessment & Plan Note (Signed)
Complicating factor to overall prognosis and care 

## 2023-02-10 NOTE — Progress Notes (Addendum)
Progress Note   Patient: Shelly Freeman IOE:[id number] DOB: 1950/05/31 DOA: 02/09/2023     0 DOS: the patient was seen and examined on 02/10/2023   Brief hospital course: Shelly Freeman is a 72 y.o. female with medical history significant for COPD, GERD, nephrolithiasis hypothyroidism, lupus, depression, chronic pain syndrome hospitalized in May 2024 with right-sided hydroureteronephrosis due to suspected passed calculus who presents to the ED with intractable mid lower back pain, radiating up to the shoulder.  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 Patient is treated with pain medicine and steroids.  Scheduled for injection by IR 10/7.   Principal Problem:   Intractable pain Active Problems:   Lumbar radicular pain, acute on chronic, intractable   History of nephrolithiasis   Hypotension due to medication   COPD (chronic obstructive pulmonary disease) (HCC)   Hypothyroidism   Obesity, Class III, BMI 40-49.9 (morbid obesity) (HCC)   Sleep apnea   Assessment and Plan: Lumbar radicular pain, acute on chronic, intractable Chronic pain Patient received IV steroids, pain medicine.  Will continue oral pain medicine today.  Consult IR for steroid injection by tomorrow. PT/OT eval and treat.  History of nephrolithiasis No evidence of nephrolithiasis on imaging  Hypotension due to medication Essential hypertension Hypotension resolved, blood pressure not significant elevated.  Continue hold off BP medicines.  COPD (chronic obstructive pulmonary disease) (HCC) Not acutely exacerbated   Hypothyroidism Continue levothyroxine  Obstructive sleep apnea Obesity, Class III, BMI 40-49.9 (morbid obesity) (HCC) Diet and excise advised.  Continue CPAP while asleep.  Hyperkalemia. Renal function normal, potassium 5.2, give 5 g of Lokelma, recheck BMP tomorrow.     Subjective:  Patient still complaining of lower back pain, some nausea no  vomiting.  Last bowel movement yesterday.  Physical Exam: Vitals:   02/10/23 0645 02/10/23 0800 02/10/23 0845 02/10/23 0900  BP:  (!) 101/52  (!) 114/59  Pulse: 65 61  64  Resp: 17 10  (!) 8  Temp:   98.2 F (36.8 C)   TempSrc:   Oral   SpO2: 96% 91%  93%  Weight:      Height:       General exam: Appears calm and comfortable, morbid obese. Respiratory system: Clear to auscultation. Respiratory effort normal. Cardiovascular system: S1 & S2 heard, RRR. No JVD, murmurs, rubs, gallops or clicks. No pedal edema. Gastrointestinal system: Abdomen is nondistended, soft and nontender. No organomegaly or masses felt. Normal bowel sounds heard. Central nervous system: Alert and oriented. No focal neurological deficits. Extremities: Symmetric 5 x 5 power. Skin: No rashes, lesions or ulcers Psychiatry: Judgement and insight appear normal. Mood & affect appropriate.    Data Reviewed:  CT, MRI results and lab results reviewed.  Family Communication: None  Disposition: Status is: Observation      Time spent: 35 minutes  Author: Marrion Coy, MD 02/10/2023 10:08 AM  For on call review [email or web address].

## 2023-02-10 NOTE — Hospital Course (Signed)
Shelly Freeman is a 72 y.o. female with medical history significant for COPD, GERD, nephrolithiasis hypothyroidism, lupus, depression, chronic pain syndrome hospitalized in May 2024 with right-sided hydroureteronephrosis due to suspected passed calculus who presents to the ED with intractable mid lower back pain, radiating up to the shoulder.  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 Patient is treated with pain medicine and steroids.  Scheduled for injection by IR 10/8. Patient is currently pending nursing home placement.

## 2023-02-10 NOTE — Progress Notes (Signed)
Physical Therapy Evaluation Patient Details Name: Shelly Freeman MRN: [id number] DOB: 1951-01-20 Today's Date: 02/10/2023  History of Present Illness  Pt is a 72 y/o female admitted secondary to intractable back pain. 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 Per medical team, consider IR consult for transforaminal steroid injection. PMH including but not limited to COPD, GERD, nephrolithiasis hypothyroidism, lupus, depression, chronic pain syndrome hospitalized in May 2024 with right-sided hydroureteronephrosis due to suspected passed calculus.   Clinical Impression  Pt presented supine in bed with HOB elevated, awake and willing to participate in therapy session. Prior to admission, pt reported that she ambulated with use of a RW and was independent with ADLs. Pt lives alone in a single level apartment with a level entry. At the time of evaluation, pt significantly limited with functional mobility secondary to severe lower back pain. Plan is for pt to have a consult with IR for an injection tomorrow per MD note on 10/6. Hopeful that pt's mobility will significantly improve with appropriate pain management. However, based on pt's current functional mobility status and lack of family support at home, PT currently recommending short-term rehab at a SNF prior to maximize her safety and independence with functional mobility prior to returning home alone. PT will continue to follow-up with pt acutely to progress mobility as tolerated.       If plan is discharge home, recommend the following: A lot of help with walking and/or transfers;A lot of help with bathing/dressing/bathroom;Assistance with cooking/housework;Assist for transportation;Help with stairs or ramp for entrance    Can travel by private vehicle   No    Equipment Recommendations Other (comment) (defer to next venue of care)  Recommendations for Other Services       Functional Status Assessment Patient has had a recent decline in their functional status and demonstrates the ability to make significant improvements in function in a reasonable and predictable amount of time.     Precautions / Restrictions Precautions Precautions: Fall Restrictions Weight Bearing Restrictions: No      Mobility  Bed Mobility Overal bed mobility: Needs Assistance Bed Mobility: Rolling Rolling: Supervision, Used rails         General bed mobility comments: pt able to roll towards her left side with VC'ing for sequencing and technique with use of bedrails in preparation for sitting EOB; however, once in left sidelying, pt refusing any further mobility at that time. She remained in left sidelying despite PT's attempts for repositioning or further mobility.    Transfers                   General transfer comment: pt refusing further mobility at this time    Ambulation/Gait               General Gait Details: pt refusing further mobility at this time  Stairs            Wheelchair Mobility     Tilt Bed    Modified Rankin (Stroke Patients Only)       Balance  Pertinent Vitals/Pain Pain Assessment Pain Assessment: Faces Faces Pain Scale: Hurts little more Pain Location: lower back Pain Descriptors / Indicators: Grimacing, Guarding, Sore Pain Intervention(s): Monitored during session, Repositioned    Home Living Family/patient expects to be discharged to:: Private residence Living Arrangements: Alone Available Help at Discharge: Family;Available PRN/intermittently Type of Home: Apartment Home Access: Level entry       Home Layout: One level Home Equipment: Agricultural consultant (2 wheels);Rollator (4 wheels)       Prior Function Prior Level of Function : Independent/Modified Independent             Mobility Comments: uses a RW ADLs Comments: ind     Extremity/Trunk Assessment   Upper Extremity Assessment Upper Extremity Assessment: Generalized weakness    Lower Extremity Assessment Lower Extremity Assessment: Generalized weakness    Cervical / Trunk Assessment Cervical / Trunk Assessment: Other exceptions Cervical / Trunk Exceptions: Imaging revealed 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 Correlate for left L4 and/or right S1  radiculopathy  Communication   Communication Communication: No apparent difficulties  Cognition Arousal: Alert Behavior During Therapy: Anxious Overall Cognitive Status: Difficult to assess                                 General Comments: difficult to assess due to pt perseverating about back pain throughout        General Comments      Exercises     Assessment/Plan    PT Assessment Patient needs continued PT services  PT Problem List Decreased strength;Decreased range of motion;Decreased activity tolerance;Decreased balance;Decreased mobility;Decreased coordination;Decreased knowledge of use of DME;Decreased safety awareness;Decreased knowledge of precautions;Pain       PT Treatment Interventions DME instruction;Gait training;Stair training;Functional mobility training;Therapeutic activities;Therapeutic exercise;Balance training;Neuromuscular re-education;Patient/family education    PT Goals (Current goals can be found in the Care Plan section)  Acute Rehab PT Goals Patient Stated Goal: decrease pain PT Goal Formulation: With patient Time For Goal Achievement: 02/24/23 Potential to Achieve Goals: Good    Frequency Min 1X/week     Co-evaluation                AM-PAC PT "6 Clicks" Mobility  Outcome Measure Help needed turning from your back to your side while in a flat bed without using bedrails?: A Little Help needed moving from lying on your back to sitting on the side of a flat bed without using bedrails?: A Little Help needed moving to and from a bed to a chair (including a wheelchair)?: A Lot Help needed standing up from a chair using your arms (e.g., wheelchair or bedside chair)?: A Lot Help needed to walk in hospital room?: A Lot Help needed climbing 3-5 steps with a railing? : Total 6 Click Score: 13    End of Session   Activity Tolerance: Patient limited by pain Patient left: in bed;with call bell/phone within reach Nurse Communication: Mobility status PT Visit Diagnosis: Other abnormalities of gait and mobility (R26.89);Pain Pain - part of body:  (lower back)    Time: 1610-9604 PT Time Calculation (min) (ACUTE ONLY): 13 min   Charges:   PT Evaluation $PT Eval Low Complexity: 1 Low   PT General Charges $$ ACUTE PT VISIT: 1 Visit  Arletta Bale, DPT  Acute Rehabilitation Services Office [phone]   Alessandra Bevels Bekim Werntz 02/10/2023, 12:29 PM

## 2023-02-11 ENCOUNTER — Encounter: Payer: Self-pay | Admitting: Internal Medicine

## 2023-02-11 ENCOUNTER — Other Ambulatory Visit (HOSPITAL_COMMUNITY): Payer: Self-pay

## 2023-02-11 DIAGNOSIS — M5416 Radiculopathy, lumbar region: Secondary | ICD-10-CM | POA: Diagnosis not present

## 2023-02-11 DIAGNOSIS — N179 Acute kidney failure, unspecified: Secondary | ICD-10-CM | POA: Diagnosis not present

## 2023-02-11 LAB — BASIC METABOLIC PANEL
Anion gap: 10 (ref 5–15)
BUN: 24 mg/dL — ABNORMAL HIGH (ref 8–23)
CO2: 26 mmol/L (ref 22–32)
Calcium: 9.3 mg/dL (ref 8.9–10.3)
Chloride: 102 mmol/L (ref 98–111)
Creatinine, Ser: 1.02 mg/dL — ABNORMAL HIGH (ref 0.44–1.00)
GFR, Estimated: 59 mL/min — ABNORMAL LOW (ref >60)
Glucose, Bld: 139 mg/dL — ABNORMAL HIGH (ref 70–99)
Potassium: 4.8 mmol/L (ref 3.5–5.1)
Sodium: 138 mmol/L (ref 135–145)

## 2023-02-11 MED ORDER — SENNOSIDES-DOCUSATE SODIUM 8.6-50 MG PO TABS
2.0000 | ORAL_TABLET | Freq: Two times a day (BID) | ORAL | Status: DC
  Administered 2023-02-11 – 2023-02-14 (×7): 2 via ORAL
  Filled 2023-02-11 (×7): qty 2

## 2023-02-11 MED ORDER — METHOCARBAMOL 500 MG PO TABS
500.0000 mg | ORAL_TABLET | Freq: Four times a day (QID) | ORAL | Status: DC | PRN
  Administered 2023-02-11 – 2023-02-12 (×2): 500 mg via ORAL
  Filled 2023-02-11 (×2): qty 1

## 2023-02-11 NOTE — Progress Notes (Signed)
Progress Note   Patient: Shelly Freeman XBJ:[id number] DOB: 12-07-1950 DOA: 02/09/2023     1 DOS: the patient was seen and examined on 02/11/2023   Brief hospital course: Shelly Freeman is a 72 y.o. female with medical history significant for COPD, GERD, nephrolithiasis hypothyroidism, lupus, depression, chronic pain syndrome hospitalized in May 2024 with right-sided hydroureteronephrosis due to suspected passed calculus who presents to the ED with intractable mid lower back pain, radiating up to the shoulder.  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 Patient is treated with pain medicine and steroids.  Scheduled for injection by IR 10/7.   Principal Problem:   Intractable pain Active Problems:   Lumbar radicular pain, acute on chronic, intractable   History of nephrolithiasis   AKI (acute kidney injury) (HCC)   Hypotension due to medication   COPD (chronic obstructive pulmonary disease) (HCC)   Hypothyroidism   Obesity, Class III, BMI 40-49.9 (morbid obesity) (HCC)   Sleep apnea   Assessment and Plan: Lumbar radicular pain, acute on chronic, intractable Chronic pain Patient received IV steroids, pain medicine.  Will continue oral pain medicine today.  Consult IR for steroid injection by tomorrow. Patient will need nursing home placement.   History of nephrolithiasis No evidence of nephrolithiasis on imaging   Hypotension due to medication Essential hypertension Hypotension resolved, blood pressure not significant elevated.  Continue hold off BP medicines.   COPD (chronic obstructive pulmonary disease) (HCC) Not acutely exacerbated     Hypothyroidism Continue levothyroxine   Obstructive sleep apnea Obesity, Class III, BMI 40-49.9 (morbid obesity) (HCC) Diet and excise advised.  Continue CPAP while asleep.   Hyperkalemia. Acute kidney injury. Potassium had normalized after giving Lokelma.  Renal function stable/better    CODE STATUS. Discussed with patient about CODE STATUS, she wishes to be DO NOT RESUSCITATE.    Subjective: Patient still complaining significant low back pain today.  Constipated.  Physical Exam: Vitals:   02/10/23 1558 02/10/23 1702 02/11/23 0210 02/11/23 0934  BP: 122/60 (!) 105/93 (!) 106/46 104/62  Pulse: 65 67 61 100  Resp: (!) 22 17 17 19   Temp: 98 F (36.7 C) 98 F (36.7 C) 97.7 F (36.5 C) 97.8 F (36.6 C)  TempSrc: Oral     SpO2: 92% 94% 93% (!) 89%  Weight:      Height:       General exam: Appears calm and comfortable, morbid obese. Respiratory system: Clear to auscultation. Respiratory effort normal. Cardiovascular system: S1 & S2 heard, RRR. No JVD, murmurs, rubs, gallops or clicks. No pedal edema. Gastrointestinal system: Abdomen is nondistended, soft and nontender. No organomegaly or masses felt. Normal bowel sounds heard. Central nervous system: Alert and oriented. No focal neurological deficits. Extremities: Symmetric 5 x 5 power. Skin: No rashes, lesions or ulcers Psychiatry: Judgement and insight appear normal. Mood & affect appropriate.    Data Reviewed:  Lab results reviewed.  Family Communication: No family  Disposition: Status is: Inpatient Remains inpatient appropriate because: Severity of disease, inpatient procedure.     Time spent: 50 minutes  Author: Marrion Coy, MD 02/11/2023 2:17 PM  For on call review [email or web address].

## 2023-02-11 NOTE — Plan of Care (Signed)
  Problem: Fluid Volume: Goal: Hemodynamic stability will improve Outcome: Progressing   Problem: Clinical Measurements: Goal: Diagnostic test results will improve Outcome: Progressing Goal: Signs and symptoms of infection will decrease Outcome: Progressing   Problem: Respiratory: Goal: Ability to maintain adequate ventilation will improve Outcome: Progressing   Problem: Education: Goal: Knowledge of General Education information will improve Description: Including pain rating scale, medication(s)/side effects and non-pharmacologic comfort measures Outcome: Progressing   Problem: Health Behavior/Discharge Planning: Goal: Ability to manage health-related needs will improve Outcome: Progressing   Problem: Clinical Measurements: Goal: Ability to maintain clinical measurements within normal limits will improve Outcome: Progressing Goal: Will remain free from infection Outcome: Progressing Goal: Diagnostic test results will improve Outcome: Progressing Goal: Respiratory complications will improve Outcome: Progressing

## 2023-02-11 NOTE — Consult Note (Signed)
Chief Complaint: Patient was seen in consultation today for Epidural Steroid Injection. Her chief complaint is  intractable back pain.  Referring Physician(s): Dr. Haze Rushing  Supervising Physician: Irish Lack  Patient Status: [facility] - In-pt  History of Present Illness:  Shelly Freeman is a 72 y.o. female, with a significant medical history for COPD, GERD, hypothyroidism, lupus,  chronic pain and nephrolithiasis with recent admit in May 2024 for passed calculus. Presents to ED on 10/5 with intractable mid low back pain, radiating up to the shoulder. In spite of treatment with  steroids, robaxin, Neurontin and trazodone, patient is unable to have relief from intractable back pain.  10/5 MRI: IMPRESSION: 1. 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 2. Mild narrowing of the left lateral recess at L3-L4 and right lateral recess at L5-S1. Correlate for left L4 and/or right S1 10/5 CT: Scan IMPRESSION: 1. No acute abnormality in the abdomen/pelvis. No renal stones or obstructive uropathy. No CT findings of pyelonephritis.  Dr Haze Rushing has made a referral to IR for an Epidural Steroid Injection   Patient seen at bedside with use of translator [phone]). Patient endorses back pain, mild chest tightness, and bilateral shoulder pain. Denies any fevers, headache, chest pain, SOB, cough, nausea, vomiting or bleeding.    Return precautions and treatment recommendations and follow-up discussed with the patient  who is agreeable with the plan.   Past Medical History:  Diagnosis Date   Anginal pain (HCC)    Arthritis    Collagen vascular disease (HCC)    COPD (chronic obstructive pulmonary disease) (HCC)    DDD (degenerative disc disease), lumbar 05/14/2016   Depression    Dizziness    Dyspnea    GERD (gastroesophageal reflux disease)    Heart valve problem    per grandaughter need surgery but they told  her may not survive;   History of kidney stones    Hypertension    Hypothyroidism    Lupus    Neuromuscular disorder (HCC)    Sleep apnea    no CPAP   Thyroid disease    Vitamin D deficiency 05/14/2016    Past Surgical History:  Procedure Laterality Date   APPENDECTOMY     BREAST BIOPSY Left    neg   CATARACT EXTRACTION W/PHACO Left 11/13/2021   Procedure: CATARACT EXTRACTION PHACO AND INTRAOCULAR LENS PLACEMENT (IOC) LEFT 3.63 00:35.9;  Surgeon: Nevada Crane, MD;  Location: [facility];  Service: Ophthalmology;  Laterality: Left;   CATARACT EXTRACTION W/PHACO Right 12/04/2021   Procedure: CATARACT EXTRACTION PHACO AND INTRAOCULAR LENS PLACEMENT (IOC) RIGHT;  Surgeon: Nevada Crane, MD;  Location: [facility];  Service: Ophthalmology;  Laterality: Right;  3.11 00:32.6   CESAREAN SECTION     x 2   COLONOSCOPY N/A 08/24/2021   Procedure: COLONOSCOPY;  Surgeon: Jaynie Collins, DO;  Location: [facility] ENDOSCOPY;  Service: Gastroenterology;  Laterality: N/A;  SPANISH INTERPRETER   COLONOSCOPY WITH PROPOFOL N/A 07/01/2017   Procedure: COLONOSCOPY WITH PROPOFOL;  Surgeon: Scot Jun, MD;  Location: [facility] ENDOSCOPY;  Service: Endoscopy;  Laterality: N/A;   ESOPHAGOGASTRODUODENOSCOPY (EGD) WITH PROPOFOL N/A 07/01/2017   Procedure: ESOPHAGOGASTRODUODENOSCOPY (EGD) WITH PROPOFOL;  Surgeon: Scot Jun, MD;  Location: [facility] ENDOSCOPY;  Service: Endoscopy;  Laterality: N/A;   GASTRIC BYPASS     LIPOMA EXCISION Left 06/14/2022   Procedure: EXCISION LIPOMA, left chest wall;  Surgeon: Sterling Big  F, MD;  Location: [facility] ORS;  Service: General;  Laterality: Left;   LITHOTRIPSY     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 Authorizing Provider  acetaminophen (TYLENOL) 650 MG CR tablet Take 650 mg by mouth 3 (three) times daily as needed for pain. 07/06/22  Yes [provider]  ADVAIR HFA 115-21 MCG/ACT inhaler Inhale 2 puffs into the lungs 2 (two) times daily. 01/06/23  Yes [provider]  alendronate (FOSAMAX) 70 MG tablet Take 70 mg by mouth once a week. Take with a full glass of water on an empty stomach.   Yes [provider]  atorvastatin (LIPITOR) 40 MG tablet Take 40 mg by mouth at bedtime. 02/05/23  Yes [provider]  benzonatate (TESSALON) 200 MG capsule Take 200 mg by mouth 3 (three) times daily as needed for cough.   Yes [provider]  buprenorphine (BUTRANS) 7.5 MCG/HR Place 1 patch onto the skin once a week. (Apply one patch to upper back/shoulder, outer arm, or upper chest alternating sites weekly on Wednesday at [facility]. May reuse site every 21 days.) 01/25/23  Yes [provider]  busPIRone (BUSPAR) 15 MG tablet Take 15 mg by mouth 2 (two) times daily. 12/05/21  Yes [provider]  celecoxib (CELEBREX) 100 MG capsule Take 1 capsule by mouth 2 (two) times daily. 12/14/21  Yes [provider]  cetirizine (ZYRTEC) 10 MG tablet Take 10 mg by mouth daily.   Yes [provider]  cholecalciferol (VITAMIN D3) 25 MCG (1000 UNIT) tablet Take 1,000 Units by mouth daily. 12/05/21  Yes [provider]  cyanocobalamin (VITAMIN B12) 1000 MCG tablet Take 1,000 mcg by mouth daily. 07/06/22  Yes [provider]  Dextromethorphan-guaiFENesin 10-100 MG/5ML liquid Take 10 mLs by mouth 3 (three) times daily as needed (for cough and nasal congestion). 10/31/22  Yes [provider]  diclofenac Sodium (VOLTAREN) 1 % GEL Apply 4 g topically 4 (four) times daily. For pain in the right knee. Rub into knee for 1 minute for each application.   Yes [provider]  escitalopram (LEXAPRO) 20 MG tablet  Take 20 mg by mouth daily.   Yes [provider]  estradiol (ESTRACE) 0.1 MG/GM vaginal cream Place 0.5 g vaginally daily. Insert 1/2 gram into vagina every night as directed for two weeks then use twice weekly on Monday and Friday nights only 07/19/22  Yes [provider]  famotidine (PEPCID) 20 MG tablet Take 20 mg by mouth 2 (two) times daily.   Yes [provider]  ferrous sulfate 325 (65 FE) MG tablet Take 325 mg by mouth every Monday, Wednesday, and Friday.   Yes [provider]  fluticasone (FLONASE) 50 MCG/ACT nasal spray Place 1 spray into both nostrils daily.   Yes [provider]  folic acid (FOLVITE) 1 MG tablet Take 1 mg by mouth daily.   Yes [provider]  gabapentin (NEURONTIN) 600 MG tablet Take 600 mg by mouth 3 (three) times daily. 05/07/21  Yes [provider]  levofloxacin (LEVAQUIN) 250 MG tablet Take 250 mg by mouth daily. For 7 days 02/01/23  Yes [provider]  levothyroxine (SYNTHROID) 100 MCG tablet Take 100 mcg by mouth  daily before breakfast.   Yes [provider]  nystatin powder Apply 1 Application topically 2 (two) times daily. Apply moderate amount to groin and under breast twice daily, please keep area dry.   Yes [provider]  OPTIVE 0.5-0.9 % ophthalmic solution Place 2 drops into both eyes 4 (four) times daily as needed (for irritated eyes). 01/10/23  Yes [provider]  pantoprazole (PROTONIX) 20 MG tablet Take 20 mg by mouth daily. 08/08/22  Yes [provider]  phenazopyridine (PYRIDIUM) 100 MG tablet Take 100 mg by mouth 3 (three) times daily as needed (for burning pain during urination). 01/28/23  Yes [provider]  Psyllium (METAMUCIL) 0.36 g CAPS Take 0.4 g by mouth daily. Take 4 capsules by mouth daily 06/07/22  Yes [provider]  psyllium (METAMUCIL) 58.6 % powder Take 1 packet by mouth daily.   Yes [provider]   QUEtiapine (SEROQUEL) 50 MG tablet Take 50 mg by mouth at bedtime.   Yes [provider]  SYMBICORT 80-4.5 MCG/ACT inhaler Inhale 2 puffs into the lungs 2 (two) times daily. 02/05/23  Yes [provider]  traZODone (DESYREL) 100 MG tablet Take 125 mg by mouth at bedtime.   Yes [provider]  traZODone (DESYREL) 50 MG tablet Take 25 mg by mouth at bedtime as needed for sleep. 01/05/21  Yes [provider]  trimethoprim (TRIMPEX) 100 MG tablet Take 1 tablet (100 mg total) by mouth daily. 12/24/22  Yes MacDiarmid, Lorin Picket, MD  Vitamin D, Ergocalciferol, (DRISDOL) 1.25 MG (50000 UNIT) CAPS capsule Take 50,000 Units by mouth once a week. On Mondays 01/06/23  Yes [provider]  albuterol (VENTOLIN HFA) 108 (90 Base) MCG/ACT inhaler Inhale into the lungs. Inhale 2 puffs into the lungs every 4 (four) hours as needed for wheezing or shortness of breath. Patient not taking: Reported on 02/10/2023    [provider]  calcium carbonate (TUMS EX) 750 MG chewable tablet Chew 1 tablet by mouth 4 (four) times daily as needed for heartburn. Patient not taking: Reported on 02/10/2023    [provider]  hydrocortisone cream 1 % Apply 1 application topically 3 (three) times daily as needed for itching. Patient not taking: Reported on 02/10/2023    [provider]  ipratropium-albuterol (DUONEB) 0.5-2.5 (3) MG/3ML SOLN Take 3 mLs by nebulization every 6 (six) hours as needed (shortness of breath). Patient not taking: Reported on 02/10/2023    [provider]  lidocaine (LIDODERM) 5 % Place 1 patch onto the skin daily. Remove & Discard patch within 12 hours or as directed by MD Patient not taking: Reported on 02/10/2023    [provider]  liver oil-zinc oxide (DESITIN) 40 % ointment Apply 1 Application topically as needed for irritation. Patient not taking: Reported on 02/10/2023    [provider]  methenamine (HIPREX) 1 g  tablet Take 1 g by mouth daily. Patient not taking: Reported on 02/10/2023 07/19/22   [provider]  metoprolol succinate (TOPROL-XL) 25 MG 24 hr tablet Take 25 mg by mouth daily.  Patient not taking: Reported on 02/10/2023    [provider]  Olopatadine HCl (PATADAY) 0.2 % SOLN Apply to eye daily as needed. Patient not taking: Reported on 02/10/2023    [provider]  oxybutynin (DITROPAN-XL) 5 MG 24 hr tablet Take 5 mg by mouth in the morning and at bedtime. Patient not taking: Reported on 02/10/2023 12/05/21   [provider]  traMADol (  ULTRAM) 50 MG tablet Take 1 tablet (50 mg total) by mouth every 6 (six) hours as needed. Patient not taking: Reported on 02/10/2023 06/14/22 06/14/23  Leafy Ro, MD     Family History  Problem Relation Age of Onset   Breast cancer Maternal Aunt    Breast cancer Cousin     Social History   Socioeconomic History   Marital status: Divorced    Spouse name: Not on file   Number of children: Not on file   Years of education: Not on file   Highest education level: Not on file  Occupational History   Not on file  Tobacco Use   Smoking status: Never    Passive exposure: Never   Smokeless tobacco: Never  Vaping Use   Vaping status: Never Used  Substance and Sexual Activity   Alcohol use: No   Drug use: No   Sexual activity: Not Currently    Birth control/protection: Abstinence, Post-menopausal  Other Topics Concern   Not on file  Social History Narrative   Lives alone   Social Determinants of Health   Financial Resource Strain: Not on file  Food Insecurity: No Food Insecurity (09/13/2022)   Hunger Vital Sign    Worried About Running Out of Food in the Last Year: Never true    Ran Out of Food in the Last Year: Never true  Transportation Needs: No Transportation Needs (09/13/2022)   PRAPARE - Administrator, Civil Service (Medical): No    Lack of Transportation (Non-Medical): No  Physical Activity:  Not on file  Stress: Not on file  Social Connections: Not on file    Review of Systems: A 12 point ROS discussed and pertinent positives are indicated in the HPI above.  All other systems are negative.  Review of Systems  Constitutional:  Positive for fatigue. Negative for fever.  Respiratory:  Positive for chest tightness. Negative for cough and shortness of breath.   Cardiovascular:  Negative for chest pain and leg swelling.  Gastrointestinal:  Positive for abdominal pain. Negative for nausea.  Genitourinary:  Negative for difficulty urinating.  Musculoskeletal:  Positive for back pain.       Patient complains low mid back pain and  chronic knee pain. She also has bilateral shoulder pain  Skin:  Negative for pallor.  Neurological:  Negative for numbness.  Psychiatric/Behavioral:  Negative for behavioral problems and confusion.     Vital Signs: BP 104/62 (BP Location: Right Arm)   Pulse 100   Temp 97.8 F (36.6 C)   Resp 19   Ht 5\' 1"  (1.549 m)   Wt 216 lb (98 kg)   SpO2 (!) 89%   BMI 40.81 kg/m   Advance Care Plan: The advanced care plan/surrogate decision maker was discussed at the time of visit and the patient did not wish to discuss or was not able to name a surrogate decision maker or provide an advance care plan.    Physical Exam Vitals reviewed.  Constitutional:      General: She is in acute distress.     Comments: Patient alert, silting in chair, audible moans from back pain   HENT:     Mouth/Throat:     Mouth: Mucous membranes are moist.  Cardiovascular:     Rate and Rhythm: Normal rate and regular rhythm.  Pulmonary:     Effort: Pulmonary effort is normal.     Breath sounds: Normal breath sounds. No wheezing.  Abdominal:  General: Bowel sounds are normal. There is no distension.     Palpations: Abdomen is soft.     Tenderness: There is no abdominal tenderness.     Comments: obese  Musculoskeletal:        General: Tenderness present.     Comments:  Paravertebral low back pain to palpation  Skin:    General: Skin is warm and dry.     Coloration: Skin is not jaundiced.  Neurological:     Mental Status: She is alert and oriented to person, place, and time.  Psychiatric:        Mood and Affect: Mood normal.        Behavior: Behavior normal.     Imaging: MR LUMBAR SPINE WO CONTRAST  Result Date: 02/09/2023 CLINICAL DATA:  Lumbar radiculopathy EXAM: MRI LUMBAR SPINE WITHOUT CONTRAST TECHNIQUE: Multiplanar, multisequence MR imaging of the lumbar spine was performed. No intravenous contrast was administered. COMPARISON:  None Available. FINDINGS: Segmentation:  Standard. Alignment:  Physiologic. Vertebrae: No fracture, evidence of discitis, or aggressive bone lesion. There is a hemangioma at T12. Conus medullaris and cauda equina: Conus extends to the L1 level. Conus and cauda equina appear normal. Paraspinal and other soft tissues: Negative. Disc levels: L1-L2: Normal disc space and facet joints. No spinal canal stenosis. No neural foraminal stenosis. L2-L3: Small left subarticular disc protrusion. No spinal canal stenosis. No neural foraminal stenosis. L3-L4: Left asymmetric disc bulge with mild narrowing of the left lateral recess. No spinal canal stenosis. No neural foraminal stenosis. L4-L5: Small central disc protrusion and mild facet arthrosis. No spinal canal stenosis. No neural foraminal stenosis. L5-S1: Right asymmetric disc bulge with narrowing of the right lateral recess. Right-greater-than-left facet arthrosis. No spinal canal stenosis. Severe right and moderate left neural foraminal stenosis. Visualized sacrum: Normal. IMPRESSION: 1. 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 2. Mild narrowing of the left lateral recess at L3-L4 and right lateral recess at L5-S1. Correlate for left L4 and/or right S1 radiculopathy. 3. No spinal canal stenosis. Electronically  Signed   By: Deatra Robinson M.D.   On: 02/09/2023 23:28   CT ABDOMEN PELVIS W CONTRAST  Result Date: 02/09/2023 CLINICAL DATA:  severe left flank pain, recent UTI, concern for pyelonephritis vs kidney stone EXAM: CT ABDOMEN AND PELVIS WITH CONTRAST TECHNIQUE: Multidetector CT imaging of the abdomen and pelvis was performed using the standard protocol following bolus administration of intravenous contrast. RADIATION DOSE REDUCTION: This exam was performed according to the departmental dose-optimization program which includes automated exposure control, adjustment of the mA and/or kV according to patient size and/or use of iterative reconstruction technique. CONTRAST:  80mL OMNIPAQUE IOHEXOL 300 MG/ML  SOLN COMPARISON:  09/13/2022 FINDINGS: Lower chest: No basilar airspace disease or pleural effusion Hepatobiliary: The liver is mildly enlarged spanning 19.5 cm cranial caudal. No focal hepatic abnormality. Gallbladder physiologically distended, no calcified stone. No biliary dilatation. Pancreas: No ductal dilatation or inflammation. Spleen: Normal in size without focal abnormality. Adrenals/Urinary Tract: No adrenal nodule. Multifocal renal parenchymal scarring. No hydronephrosis. No renal calculi. No perinephric inflammation. Homogeneous enhancement with symmetric excretion on delayed phase imaging. Decompressed ureters. Minimally distended urinary bladder, normal for degree of distension. Stomach/Bowel: Postsurgical change in the stomach. Gastric suture line crosses the diaphragmatic hiatus. There is a small hiatal hernia. No bowel obstruction or inflammation. Enteric suture line in the left abdomen. Moderate volume of colonic stool. The appendix is not seen. Vascular/Lymphatic:  Normal caliber abdominal aorta. Patent portal vein. No acute vascular findings. No abdominopelvic adenopathy. Reproductive: No mass. Other: No free air or ascites.  No abdominal wall hernia. Musculoskeletal: Lumbar degenerative change,  primarily at L5-S1. There are no acute or suspicious osseous abnormalities. IMPRESSION: 1. No acute abnormality in the abdomen/pelvis. No renal stones or obstructive uropathy. No CT findings of pyelonephritis. 2. Multifocal renal parenchymal scarring. 3. Mild hepatomegaly. 4. Postsurgical change in the stomach with small hiatal hernia. Electronically Signed   By: Narda Rutherford M.D.   On: 02/09/2023 20:53    Vitals:   02/11/23 0210 02/11/23 0934  BP: (!) 106/46 104/62  Pulse: 61 100  Resp: 17 19  Temp: 97.7 F (36.5 C) 97.8 F (36.6 C)  SpO2: 93% (!) 89%    Labs:  CBC: Recent Labs    09/14/22 0738 09/15/22 0543 12/13/22 0830 02/09/23 1838  WBC 11.7* 8.3 15.9* 7.8  HGB 12.3 12.9 12.5 12.8  HCT 39.3 41.6 39.9 41.6  PLT 174 190 171 201    COAGS: No results for input(s): "INR", "APTT" in the last 8760 hours.  BMP: Recent Labs    12/13/22 0830 02/09/23 1838 02/10/23 0822 02/11/23 0252  NA 141 139 138 138  K 5.0 5.4* 5.2* 4.8  CL 107 106 104 102  CO2 27 24 26 26   GLUCOSE 127* 88 158* 139*  BUN 22 22 22  24*  CALCIUM 9.0 9.7 9.1 9.3  CREATININE 1.04* 1.36* 0.93 1.02*  GFRNONAA 58* 42* >60 59*    LIVER FUNCTION TESTS: Recent Labs    07/20/22 1240 09/13/22 0523  BILITOT 1.0 0.7  AST 35 20  ALT 17 12  ALKPHOS 76 89  PROT 6.5 6.3*  ALBUMIN 2.9* 3.2*     Assessment and Plan:   Joeline Freer is a 72 y.o. female, with a significant medical history for COPD, GERD, hypothyroidism, lupus,  chronic pain and nephrolithiasis with recent admit in May 2024 for passed calculus. Presents to ED on 10/5 with intractable mid low back pain, radiating up to the shoulder. In spite of treatment with  steroids, robaxin, Neurontin and trazodone, patient is unable to have relief from intractable back pain.  10/5 MRI: IMPRESSION: 1. 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 2. Mild narrowing of the  left lateral recess at L3-L4 and right lateral recess at L5-S1. Correlate for left L4 and/or right S1 10/5 CT: Scan IMPRESSION: 1. No acute abnormality in the abdomen/pelvis. No renal stones or obstructive uropathy. No CT findings of pyelonephritis. Risks and benefits discussed with the patient including bleeding, infection, damage to adjacent structures, dural puncture, nerve damage and the fact that the injection does not provide any, or only provides minimal, pain relief. Plan:   Request made by Dr. Haze Rushing.  Images were reviewed by Dr. Jeannie Fend. EL-Abd with plans to proceed with Epidural Steroid Injection tentatively scheduled in IR  All labs and medication are within acceptable parameters. No pertinent allergies.  Team instructed to :  -Npo at midnight -Do not start prophylactic anticoagulation - IR to call for patient  All of the patient's questions were answered, patient is agreeable to proceed. -Consent signed and in chart.  -Spanish Interpreter [phone]) used.    Thank you for this interesting consult.  I greatly enjoyed meeting Kensli Bowley and look forward to participating in their care.  A copy of this report was sent to the requesting provider  on this date.  Electronically Signed: Ardith Dark, NP 02/11/2023, 11:29 AM   I spent a total of 20 Minutes    in face to face in clinical consultation, greater than 50% of which was counseling/coordinating care for Image Guided Epidural Steroid Injection

## 2023-02-11 NOTE — Progress Notes (Signed)
Physical Therapy Treatment Patient Details Name: Shelly Freeman MRN: [id number] DOB: [birth year] Today's Date: 02/11/2023   History of Present Illness Pt is a 72 y/o female admitted secondary to intractable back pain. 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 Per medical team, consider IR consult for transforaminal steroid injection. PMH including but not limited to COPD, GERD, nephrolithiasis hypothyroidism, lupus, depression, chronic pain syndrome hospitalized in May 2024 with right-sided hydroureteronephrosis due to suspected passed calculus.    PT Comments  Pt seen for PT tx with pt agreeable. Pt requires cuing to attempt tasks on her own as pt is quick to state she needs help, prior to attempting mobility task. Pt is able to complete bed mobility with hospital bed features, supervision, extra time. Pt transfers to standing & to recliner with RW & CGA but is limited from further mobility 2/2 pain. Will continue to follow pt acutely to progress mobility as able.  Utilized iPad interpreter: Byrd Hesselbach [phone]    If plan is discharge home, recommend the following: Assistance with cooking/housework;Assist for transportation;Help with stairs or ramp for entrance;A little help with walking and/or transfers;A little help with bathing/dressing/bathroom   Can travel by private vehicle     No  Equipment Recommendations  Other (comment) (defer to next venue)    Recommendations for Other Services       Precautions / Restrictions Precautions Precautions: Fall Restrictions Weight Bearing Restrictions: No     Mobility  Bed Mobility Overal bed mobility: Needs Assistance Bed Mobility: Supine to Sit     Supine to sit: Supervision, Used rails, HOB elevated      General bed mobility comments: cuing to utilize bed rails, HOB elevated, extra time    Transfers Overall transfer level: Needs assistance Equipment used: Rolling walker (2 wheels) Transfers: Sit to/from Stand, Bed to chair/wheelchair/BSC Sit to Stand: Contact guard assist   Step pivot transfers: Contact guard assist       General transfer comment: cuing re: hand placement during STS, cuing to initiate STS    Ambulation/Gait                   Stairs             Wheelchair Mobility     Tilt Bed    Modified Rankin (Stroke Patients Only)       Balance Overall balance assessment: Needs assistance Sitting-balance support: Feet supported Sitting balance-Leahy Scale: Fair Sitting balance - Comments: supervision static sitting   Standing balance support: Bilateral upper extremity supported, During functional activity, Reliant on assistive device for balance Standing balance-Leahy Scale: Fair                              Cognition Arousal: Alert Behavior During Therapy: Anxious                                   General Comments: requires encouragement to attempt activites on her own, as pt will instead state "I need help"        Exercises      General Comments        Pertinent Vitals/Pain Pain Assessment Pain Assessment:  0-10 Pain Score: 6  Pain Location: Pt reports 6/10 pain at rest, appears to increase with movement. Pain Descriptors / Indicators: Grimacing, Guarding Pain Intervention(s): Monitored during session, Utilized relaxation techniques    Home Living                          Prior Function            PT Goals (current goals can now be found in the care plan section) Acute Rehab PT Goals Patient Stated Goal: decrease pain PT Goal Formulation: With patient Time For Goal Achievement: 02/24/23 Potential to Achieve Goals: Good Progress towards PT goals: Progressing toward goals     Frequency    Min 1X/week      PT Plan      Co-evaluation              AM-PAC PT "6 Clicks" Mobility   Outcome Measure  Help needed turning from your back to your side while in a flat bed without using bedrails?: A Little Help needed moving from lying on your back to sitting on the side of a flat bed without using bedrails?: A Little Help needed moving to and from a bed to a chair (including a wheelchair)?: A Little Help needed standing up from a chair using your arms (e.g., wheelchair or bedside chair)?: A Little Help needed to walk in hospital room?: A Little Help needed climbing 3-5 steps with a railing? : A Lot 6 Click Score: 17    End of Session   Activity Tolerance: Patient limited by pain Patient left: in chair;with chair alarm set;with call bell/phone within reach   PT Visit Diagnosis: Pain;Difficulty in walking, not elsewhere classified (R26.2);Other abnormalities of gait and mobility (R26.89) Pain - part of body:  (back)     Time: 9562-1308 PT Time Calculation (min) (ACUTE ONLY): 17 min  Charges:    $Therapeutic Activity: 8-22 mins PT General Charges $$ ACUTE PT VISIT: 1 Visit                     Aleda Grana, PT, DPT 02/11/23, 12:02 PM    Sandi Mariscal 02/11/2023, 12:01 PM

## 2023-02-11 NOTE — Evaluation (Signed)
Occupational Therapy Evaluation Patient Details Name: Shelly Freeman MRN: [id number] DOB: 1950-12-16 Today's Date: 02/11/2023   History of Present Illness Pt is a 72 y/o female admitted secondary to intractable back pain. 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 Per medical team, consider IR consult for transforaminal steroid injection. PMH including but not limited to COPD, GERD, nephrolithiasis hypothyroidism, lupus, depression, chronic pain syndrome hospitalized in May 2024 with right-sided hydroureteronephrosis due to suspected passed calculus.   Clinical Impression   Pt was seen for OT evaluation this date. Prior to hospital admission, pt was living alone in an apartment and IND with ADL performance.   Pt presents to acute OT demonstrating impaired ADL performance and functional mobility 2/2 severe low back pain that increases with movement and severely limits her ability to perform ADLs and mobility (See OT problem list for additional functional deficits). Pt currently requires CGA for STS from recliner to RW with increased time and verb cues for hand placement to push up from recliner. She reports 10/10 pain during OT eval, but wished to get back in bed. She needed CGA for SPT from recliner back to bed and CGA to Min A for sit to supine for BLE management to reduce pain. She demo ability to self feed with set up. Due to her severe pain, pt is likely to need increased assistance with all ADL/IADL tasks and is not safe to return home alone in this state.  Pt would benefit from skilled OT services to address noted impairments and functional limitations (see below for any additional details) in order to maximize safety and independence while minimizing falls risk and  caregiver burden.     If plan is discharge home, recommend the following: A little help with walking and/or transfers;A lot of help with bathing/dressing/bathroom;Assistance with cooking/housework;Assist for transportation;Help with stairs or ramp for entrance    Functional Status Assessment  Patient has had a recent decline in their functional status and demonstrates the ability to make significant improvements in function in a reasonable and predictable amount of time.  Equipment Recommendations  Tub/shower bench    Recommendations for Other Services       Precautions / Restrictions Precautions Precautions: Fall Restrictions Weight Bearing Restrictions: No      Mobility Bed Mobility Overal bed mobility: Needs Assistance Bed Mobility: Sit to Supine       Sit to supine: Contact guard assist, Used rails, Min assist   General bed mobility comments: extra time and Min/CGA for BLE management to limit back pain    Transfers Overall transfer level: Needs assistance Equipment used: Rolling walker (2 wheels) Transfers: Sit to/from Stand, Bed to chair/wheelchair/BSC Sit to Stand: Contact guard assist     Step pivot transfers: Contact guard assist     General transfer comment: hand placement to push up from recliener then reach for RW once in standing      Balance Overall balance assessment: Needs assistance Sitting-balance support: Feet supported Sitting balance-Leahy Scale: Fair Sitting balance - Comments: supervision static sitting   Standing balance support: Bilateral upper extremity supported, During functional activity, Reliant on assistive device for balance Standing balance-Leahy Scale: Fair  ADL either performed or assessed with clinical judgement   ADL Overall ADL's : Needs assistance/impaired Eating/Feeding: Set up                       Toilet Transfer: Contact guard assist Toilet Transfer Details  (indicate cue type and reason): simulated toilet transfer from recliner back to bed with CGA, extra time           General ADL Comments: likely to need AE/AD/DME and assistance with ADLs at this time d/t severe low back pain     Vision         Perception         Praxis         Pertinent Vitals/Pain Pain Assessment Pain Assessment: 0-10 Pain Score: 9  Pain Descriptors / Indicators: Grimacing, Guarding Pain Intervention(s): Monitored during session, Repositioned, Utilized relaxation techniques     Extremity/Trunk Assessment         Cervical / Trunk Assessment Cervical / Trunk Assessment: Other exceptions Cervical / Trunk Exceptions: Imaging revealed 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 Correlate for left L4 and/or right S1  radiculopathy   Communication     Cognition Arousal: Alert Behavior During Therapy: Anxious Overall Cognitive Status: Difficult to assess                                       General Comments  back pain limiting pt's abilities and needs extra time for all tasks    Exercises     Shoulder Instructions      Home Living Family/patient expects to be discharged to:: Private residence Living Arrangements: Alone Available Help at Discharge: Family;Available PRN/intermittently Type of Home: Apartment Home Access: Level entry     Home Layout: One level     Bathroom Shower/Tub: Chief Strategy Officer: Standard (BSC over toilet)     Home Equipment: Rolling Walker (2 wheels);Rollator (4 wheels)          Prior Functioning/Environment Prior Level of Function : Independent/Modified Independent             Mobility Comments: uses a RW ADLs Comments: ind        OT Problem List: Decreased strength;Pain;Decreased activity tolerance       OT Treatment/Interventions: Self-care/ADL training;Therapeutic exercise;Therapeutic activities;DME and/or AE instruction;Energy conservation;Patient/family education    OT Goals(Current goals can be found in the care plan section) Acute Rehab OT Goals Patient Stated Goal: return home with reduced pain/better pain control OT Goal Formulation: With patient Time For Goal Achievement: 02/25/23 Potential to Achieve Goals: Fair ADL Goals Pt Will Perform Lower Body Bathing: with contact guard assist;sit to/from stand Pt Will Transfer to Toilet: with supervision;bedside commode Additional ADL Goal #1: Pt will demo/verbalize appropriate use of DME/AE/AD to avoid bending to decrease pain for improved safety and IND with ADL performance.  OT Frequency: Min 1X/week    Co-evaluation              AM-PAC OT "6 Clicks" Daily Activity     Outcome Measure Help from another person eating meals?: None Help from another person taking care of personal grooming?: None Help from another person toileting, which includes using toliet, bedpan, or urinal?:  A Little Help from another person bathing (including washing, rinsing, drying)?: A Lot Help from another person to put on and taking off regular upper body clothing?: A Little Help from another person to put on and taking off regular lower body clothing?: A Lot 6 Click Score: 18   End of Session Equipment Utilized During Treatment: Rolling walker (2 wheels) Nurse Communication: Mobility status  Activity Tolerance: Patient limited by pain Patient left: in bed;with call bell/phone within reach;with bed alarm set  OT Visit Diagnosis: Other abnormalities of gait and mobility (R26.89);Pain Pain - part of body:  (lower back\)                Time: 6045-4098 OT Time Calculation (min): 19 min Charges:  OT General Charges $OT Visit: 1 Visit OT Evaluation $OT Eval Moderate Complexity: 1 Mod  Carrell Rahmani, OTR/L 02/11/23, 2:43 PM  Stevi Hollinshead E  Marylouise Mallet 02/11/2023, 2:39 PM

## 2023-02-11 NOTE — Progress Notes (Signed)
Nurse attempted to call granddaughter with an update as requested by the patient. Nurse left voicemail for the granddaughter to return the call.

## 2023-02-12 ENCOUNTER — Inpatient Hospital Stay: Payer: Medicare (Managed Care) | Admitting: Radiology

## 2023-02-12 DIAGNOSIS — M5416 Radiculopathy, lumbar region: Secondary | ICD-10-CM | POA: Diagnosis not present

## 2023-02-12 DIAGNOSIS — J449 Chronic obstructive pulmonary disease, unspecified: Secondary | ICD-10-CM | POA: Diagnosis not present

## 2023-02-12 HISTORY — PX: IR INJECT/THERA/INC NEEDLE/CATH/PLC EPI/LUMB/SAC W/IMG: IMG6130

## 2023-02-12 LAB — CBC
HCT: 41.9 % (ref 36.0–46.0)
Hemoglobin: 13.2 g/dL (ref 12.0–15.0)
MCH: 29.3 pg (ref 26.0–34.0)
MCHC: 31.5 g/dL (ref 30.0–36.0)
MCV: 93.1 fL (ref 80.0–100.0)
Platelets: 203 10*3/uL (ref 150–400)
RBC: 4.5 MIL/uL (ref 3.87–5.11)
RDW: 14.1 % (ref 11.5–15.5)
WBC: 10.6 10*3/uL — ABNORMAL HIGH (ref 4.0–10.5)
nRBC: 0 % (ref 0.0–0.2)

## 2023-02-12 LAB — PROTIME-INR
INR: 1 (ref 0.8–1.2)
Prothrombin Time: 13.5 s (ref 11.4–15.2)

## 2023-02-12 MED ORDER — METHYLPREDNISOLONE ACETATE 80 MG/ML IJ SUSP
INTRAMUSCULAR | Status: AC
  Filled 2023-02-12: qty 1

## 2023-02-12 MED ORDER — IOHEXOL 180 MG/ML  SOLN
1.0000 mL | Freq: Once | INTRAMUSCULAR | Status: AC | PRN
  Administered 2023-02-12: 1 mL

## 2023-02-12 MED ORDER — LIDOCAINE HCL (PF) 1 % IJ SOLN
INTRAMUSCULAR | Status: AC
  Filled 2023-02-12: qty 30

## 2023-02-12 MED ORDER — SODIUM CHLORIDE (PF) 0.9 % IJ SOLN
INTRAMUSCULAR | Status: AC
  Filled 2023-02-12: qty 10

## 2023-02-12 MED ORDER — METHYLPREDNISOLONE ACETATE 40 MG/ML INJ SUSP (RADIOLOG
80.0000 mg | Freq: Once | INTRAMUSCULAR | Status: AC
  Administered 2023-02-12: 80 mg via EPIDURAL

## 2023-02-12 MED ORDER — METOPROLOL SUCCINATE ER 25 MG PO TB24
25.0000 mg | ORAL_TABLET | Freq: Every day | ORAL | Status: DC
  Administered 2023-02-12 – 2023-02-14 (×3): 25 mg via ORAL
  Filled 2023-02-12 (×3): qty 1

## 2023-02-12 MED ORDER — HYDROMORPHONE HCL 1 MG/ML IJ SOLN
0.5000 mg | INTRAMUSCULAR | Status: DC | PRN
  Administered 2023-02-12 – 2023-02-14 (×9): 0.5 mg via INTRAVENOUS
  Filled 2023-02-12 (×9): qty 0.5

## 2023-02-12 MED ORDER — LIDOCAINE HCL (PF) 1 % IJ SOLN
10.0000 mL | Freq: Once | INTRAMUSCULAR | Status: AC
  Administered 2023-02-12: 10 mL via INTRADERMAL

## 2023-02-12 NOTE — Progress Notes (Signed)
Progress Note   Patient: Shelly Freeman XBM:[id number] DOB: 1950/05/20 DOA: 02/09/2023     2 DOS: the patient was seen and examined on 02/12/2023   Brief hospital course: Shelly Freeman is a 72 y.o. female with medical history significant for COPD, GERD, nephrolithiasis hypothyroidism, lupus, depression, chronic pain syndrome hospitalized in May 2024 with right-sided hydroureteronephrosis due to suspected passed calculus who presents to the ED with intractable mid lower back pain, radiating up to the shoulder.  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 Patient is treated with pain medicine and steroids.  Scheduled for injection by IR 10/8. Patient is currently pending nursing home placement.   Principal Problem:   Intractable pain Active Problems:   Lumbar radicular pain, acute on chronic, intractable   History of nephrolithiasis   AKI (acute kidney injury) (HCC)   Hypotension due to medication   COPD (chronic obstructive pulmonary disease) (HCC)   Hypothyroidism   Obesity, Class III, BMI 40-49.9 (morbid obesity) (HCC)   Sleep apnea   Assessment and Plan: Lumbar radicular pain, acute on chronic, intractable Chronic pain Patient received IV steroids, pain medicine.  Will continue oral pain medicine today. IR steroid injection by today. Pending nursing home placement.   History of nephrolithiasis No evidence of nephrolithiasis on imaging   Hypotension due to medication Essential hypertension Blood pressure is running higher again, restarted home dose of metoprolol.   COPD (chronic obstructive pulmonary disease) (HCC) Not acutely exacerbated     Hypothyroidism Continue levothyroxine   Obstructive sleep apnea Obesity, Class III, BMI 40-49.9 (morbid obesity) (HCC) Diet and excise advised.  Continue CPAP while asleep.   Hyperkalemia. Acute kidney injury. Potassium had normalized after giving Lokelma.  Renal function  stable/better      Subjective:  Patient still complaining of severe pain in the lower back, had a normal bowel movement today.  No nausea vomiting.  Physical Exam: Vitals:   02/11/23 1617 02/11/23 2310 02/12/23 0739 02/12/23 0823  BP: 135/69 (!) 136/55 105/67 (!) 144/59  Pulse: (!) 57 (!) 58 83 (!) 58  Resp: 16 18 18 17   Temp: 98.4 F (36.9 C) 97.7 F (36.5 C) 98.3 F (36.8 C) 98.1 F (36.7 C)  TempSrc:  Oral Oral   SpO2: 93% 94% 96% 97%  Weight:      Height:       General exam: Appears calm and comfortable, morbidly obese. Respiratory system: Clear to auscultation. Respiratory effort normal. Cardiovascular system: S1 & S2 heard, RRR. No JVD, murmurs, rubs, gallops or clicks. No pedal edema. Gastrointestinal system: Abdomen is nondistended, soft and nontender. No organomegaly or masses felt. Normal bowel sounds heard. Central nervous system: Alert and oriented. No focal neurological deficits. Extremities: Symmetric 5 x 5 power. Skin: No rashes, lesions or ulcers Psychiatry: Judgement and insight appear normal. Mood & affect appropriate.    Data Reviewed:  Lab results reviewed.  Family Communication: No family  Disposition: Status is: Inpatient Remains inpatient appropriate because: Severity of disease, inpatient procedure pending.     Time spent: 35 minutes  Author: Marrion Coy, MD 02/12/2023 10:08 AM  For on call review [email or web address].

## 2023-02-12 NOTE — Progress Notes (Signed)
Occupational Therapy Treatment Patient Details Name: Shelly Freeman MRN: [id number] DOB: [birth year] Today's Date: 02/12/2023   History of present illness Pt is a 72 y/o female admitted secondary to intractable back pain. 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 Per medical team, consider IR consult for transforaminal steroid injection. PMH including but not limited to COPD, GERD, nephrolithiasis hypothyroidism, lupus, depression, chronic pain syndrome hospitalized in May 2024 with right-sided hydroureteronephrosis due to suspected passed calculus.   OT comments  Pt is supine in bed on arrival. Easily arousable and agreeable to OT session. She reports pain at 6/10 at rest and with movement today. She performed bed mobility with SUP and STS from EOB with SBA during session. UB ADLs performed seated at EOB with set up and LB bathing performed in standing with SUP. She remains slightly limited by pain, but seems improved compared to yesterday. Pt returned to bed with all needs in place and will cont to require skilled acute OT services to maximize his safety and IND to return to PLOF.       If plan is discharge home, recommend the following:  A little help with walking and/or transfers;Assistance with cooking/housework;Assist for transportation;Help with stairs or ramp for entrance;A little help with bathing/dressing/bathroom   Equipment Recommendations  Tub/shower bench    Recommendations for Other Services      Precautions / Restrictions Precautions Precautions: Fall Restrictions Weight Bearing Restrictions: No       Mobility Bed Mobility Overal bed mobility: Needs Assistance Bed Mobility: Sit to Supine, Supine to Sit     Supine to sit: Supervision,  Used rails, HOB elevated Sit to supine: Used rails, Supervision        Transfers Overall transfer level: Needs assistance Equipment used: Rolling walker (2 wheels) Transfers: Sit to/from Stand Sit to Stand: Contact guard assist, Supervision           General transfer comment: STS from EOB with SBA     Balance Overall balance assessment: Needs assistance Sitting-balance support: Feet supported Sitting balance-Leahy Scale: Good     Standing balance support: During functional activity, No upper extremity supported Standing balance-Leahy Scale: Good                             ADL either performed or assessed with clinical judgement   ADL Overall ADL's : Needs assistance/impaired     Grooming: Oral care;Set up   Upper Body Bathing: Set up;Sitting   Lower Body Bathing: Supervison/ safety;Sit to/from stand   Upper Body Dressing : Set up                     General ADL Comments: basin bath at EOB, UB ADLs performed in seated position and LB bathing done in standing at EOB with SUP    Extremity/Trunk Assessment Upper Extremity Assessment Upper Extremity Assessment: Generalized weakness   Lower Extremity Assessment Lower Extremity Assessment: Generalized weakness   Cervical / Trunk Assessment Cervical / Trunk Assessment: Other exceptions Cervical / Trunk Exceptions: Imaging revealed 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 Correlate for left L4 and/or right S1  radiculopathy    Vision       Perception     Praxis      Cognition Arousal: Alert Behavior During Therapy: Anxious Overall Cognitive Status: Difficult to assess                                          Exercises      Shoulder Instructions       General Comments continued back pain, better  today than yesterday    Pertinent Vitals/ Pain       Pain Assessment Pain Assessment: 0-10 Pain Score: 6  Pain Location: 6/10 at rest and with movement during session today Pain Descriptors / Indicators: Grimacing Pain Intervention(s): Monitored during session, Repositioned  Home Living                                          Prior Functioning/Environment              Frequency  Min 1X/week        Progress Toward Goals  OT Goals(current goals can now be found in the care plan section)  Progress towards OT goals: Progressing toward goals  Acute Rehab OT Goals Patient Stated Goal: reduce pain and get stronger OT Goal Formulation: With patient Time For Goal Achievement: 02/25/23 Potential to Achieve Goals: Good  Plan      Co-evaluation                 AM-PAC OT "6 Clicks" Daily Activity     Outcome Measure   Help from another person eating meals?: None Help from another person taking care of personal grooming?: None Help from another person toileting, which includes using toliet, bedpan, or urinal?: A Little Help from another person bathing (including washing, rinsing, drying)?: A Little Help from another person to put on and taking off regular upper body clothing?: A Little Help from another person to put on and taking off regular lower body clothing?: A Little 6 Click Score: 20    End of Session Equipment Utilized During Treatment: Rolling walker (2 wheels)  OT Visit Diagnosis: Other abnormalities of gait and mobility (R26.89);Pain   Activity Tolerance Patient limited by pain   Patient Left in bed;with call bell/phone within reach;with bed alarm set   Nurse Communication Mobility status        Time: 1610-9604 OT Time Calculation (min): 21 min  Charges: OT General Charges $OT Visit: 1 Visit OT Treatments $Self Care/Home Management : 8-22 mins  Daelynn Blower, OTR/L 02/12/23, 4:43 PM   Mishayla Sliwinski E Dev Dhondt 02/12/2023, 4:42  PM

## 2023-02-12 NOTE — Plan of Care (Signed)

## 2023-02-12 NOTE — Plan of Care (Signed)

## 2023-02-12 NOTE — Plan of Care (Signed)
  Problem: Education: Goal: Knowledge of General Education information will improve Description: Including pain rating scale, medication(s)/side effects and non-pharmacologic comfort measures 02/12/2023 1503 by Frann Rider D, LPN Outcome: Progressing 02/12/2023 1342 by Frann Rider D, LPN Outcome: Progressing   Problem: Activity: Goal: Risk for activity intolerance will decrease 02/12/2023 1503 by Frann Rider D, LPN Outcome: Progressing 02/12/2023 1342 by Frann Rider D, LPN Outcome: Progressing   Problem: Nutrition: Goal: Adequate nutrition will be maintained 02/12/2023 1503 by Frann Rider D, LPN Outcome: Progressing 02/12/2023 1342 by Frann Rider D, LPN Outcome: Progressing   Problem: Safety: Goal: Ability to remain free from injury will improve 02/12/2023 1503 by Frann Rider D, LPN Outcome: Progressing 02/12/2023 1342 by Frann Rider D, LPN Outcome: Progressing   Problem: Skin Integrity: Goal: Risk for impaired skin integrity will decrease 02/12/2023 1503 by Frann Rider D, LPN Outcome: Progressing 02/12/2023 1342 by Frann Rider D, LPN Outcome: Progressing

## 2023-02-13 DIAGNOSIS — G894 Chronic pain syndrome: Secondary | ICD-10-CM | POA: Diagnosis present

## 2023-02-13 DIAGNOSIS — M5416 Radiculopathy, lumbar region: Secondary | ICD-10-CM | POA: Diagnosis not present

## 2023-02-13 LAB — BASIC METABOLIC PANEL
Anion gap: 12 (ref 5–15)
BUN: 31 mg/dL — ABNORMAL HIGH (ref 8–23)
CO2: 22 mmol/L (ref 22–32)
Calcium: 9.1 mg/dL (ref 8.9–10.3)
Chloride: 102 mmol/L (ref 98–111)
Creatinine, Ser: 0.82 mg/dL (ref 0.44–1.00)
GFR, Estimated: >60 mL/min (ref >60)
Glucose, Bld: 112 mg/dL — ABNORMAL HIGH (ref 70–99)
Potassium: 4.7 mmol/L (ref 3.5–5.1)
Sodium: 136 mmol/L (ref 135–145)

## 2023-02-13 LAB — CBC WITH DIFFERENTIAL/PLATELET
Abs Immature Granulocytes: 0.06 10*3/uL (ref 0.00–0.07)
Basophils Absolute: 0 10*3/uL (ref 0.0–0.1)
Basophils Relative: 0 %
Eosinophils Absolute: 0 10*3/uL (ref 0.0–0.5)
Eosinophils Relative: 0 %
HCT: 41.9 % (ref 36.0–46.0)
Hemoglobin: 13.4 g/dL (ref 12.0–15.0)
Immature Granulocytes: 1 %
Lymphocytes Relative: 11 %
Lymphs Abs: 1.1 10*3/uL (ref 0.7–4.0)
MCH: 29.7 pg (ref 26.0–34.0)
MCHC: 32 g/dL (ref 30.0–36.0)
MCV: 92.9 fL (ref 80.0–100.0)
Monocytes Absolute: 0.3 10*3/uL (ref 0.1–1.0)
Monocytes Relative: 3 %
Neutro Abs: 8.5 10*3/uL — ABNORMAL HIGH (ref 1.7–7.7)
Neutrophils Relative %: 85 %
Platelets: 192 10*3/uL (ref 150–400)
RBC: 4.51 MIL/uL (ref 3.87–5.11)
RDW: 14.3 % (ref 11.5–15.5)
WBC: 10 10*3/uL (ref 4.0–10.5)
nRBC: 0 % (ref 0.0–0.2)

## 2023-02-13 MED ORDER — ATORVASTATIN CALCIUM 20 MG PO TABS
40.0000 mg | ORAL_TABLET | Freq: Every day | ORAL | Status: DC
  Administered 2023-02-13: 40 mg via ORAL
  Filled 2023-02-13: qty 2

## 2023-02-13 MED ORDER — POLYVINYL ALCOHOL 1.4 % OP SOLN: 2.0000 [drp] | OPHTHALMIC | Status: DC | PRN

## 2023-02-13 MED ORDER — LORATADINE 10 MG PO TABS
10.0000 mg | ORAL_TABLET | Freq: Every day | ORAL | Status: DC
  Administered 2023-02-13 – 2023-02-14 (×2): 10 mg via ORAL
  Filled 2023-02-13 (×2): qty 1

## 2023-02-13 MED ORDER — FLUTICASONE PROPIONATE 50 MCG/ACT NA SUSP
1.0000 | Freq: Every day | NASAL | Status: DC
  Administered 2023-02-13: 1 via NASAL
  Filled 2023-02-13: qty 16

## 2023-02-13 MED ORDER — BUPRENORPHINE 7.5 MCG/HR TD PTWK
1.0000 | MEDICATED_PATCH | TRANSDERMAL | Status: DC
  Administered 2023-02-13: 1 via TRANSDERMAL
  Filled 2023-02-13: qty 1

## 2023-02-13 MED ORDER — CARBOXYMETHYLCELLUL-GLYCERIN 0.5-0.9 % OP SOLN: 2.0000 [drp] | Freq: Four times a day (QID) | OPHTHALMIC | Status: DC | PRN

## 2023-02-13 MED ORDER — TRIMETHOPRIM 100 MG PO TABS
100.0000 mg | ORAL_TABLET | Freq: Every day | ORAL | Status: DC
  Administered 2023-02-13 – 2023-02-14 (×2): 100 mg via ORAL
  Filled 2023-02-13 (×2): qty 1

## 2023-02-13 MED ORDER — MOMETASONE FURO-FORMOTEROL FUM 200-5 MCG/ACT IN AERO
2.0000 | INHALATION_SPRAY | Freq: Two times a day (BID) | RESPIRATORY_TRACT | Status: DC
  Administered 2023-02-13 – 2023-02-14 (×2): 2 via RESPIRATORY_TRACT
  Filled 2023-02-13: qty 8.8

## 2023-02-13 MED ORDER — CELECOXIB 100 MG PO CAPS
100.0000 mg | ORAL_CAPSULE | Freq: Two times a day (BID) | ORAL | Status: DC
  Administered 2023-02-13 – 2023-02-14 (×3): 100 mg via ORAL
  Filled 2023-02-13 (×3): qty 1

## 2023-02-13 MED ORDER — ORAL CARE MOUTH RINSE: 15.0000 mL | OROMUCOSAL | Status: DC | PRN

## 2023-02-13 NOTE — Plan of Care (Signed)

## 2023-02-13 NOTE — TOC Progression Note (Signed)
Transition of Care [facility]) - Progression Note    Patient Details  Name: Shelly Freeman MRN: [id number] Date of Birth: 01-23-1951  Transition of Care [facility] ([facility] CM/SW Contact  Garret Reddish, RN Phone Number: 02/13/2023, 4:14 PM  Clinical Narrative:     I have spoken with Shelly Freeman at the bedside today.  She informs me that she would like to go to SNF on discharge.  She would like to go to the any facility that [facility] is contracted with expect for [facility].  I have informed her that I would speak with [facility] RN, Case Manager to find out which facilities are in network with [facility].   She is agreeable.    I have spoken with Shelly Freeman. RN, Case Manager for [facility].  She informs me that they are in network with [facility], [facility], and [facility].  She informs me that she also spoke with Shelly Freeman and she would prefer to go to [facility] or [facility].    I have informed Shelly Freeman that I will fax out to [facility] and [facility].    I will have completed Fl 2 and submitted for [facility].  Patient has flagged for level 2 [facility].    I have faxed out to [facility] and [facility].  TOC will continue to follow for discharge planning.           Expected Discharge Plan and Services                                               Social Determinants of Health (SDOH) Interventions SDOH Screenings   Food Insecurity: No Food Insecurity (09/13/2022)  Housing: Low Risk  (09/13/2022)  Transportation Needs: No Transportation Needs (09/13/2022)  Utilities: Not At Risk (09/13/2022)  Alcohol Screen: Low Risk  (12/09/2017)  Tobacco Use: Low Risk  (02/11/2023)    Readmission Risk Interventions    01/28/2021   12:19 PM  Readmission Risk Prevention Plan  Transportation Screening Complete  PCP or Specialist Appt within 3-5 Days Complete  HRI or Home Care Consult Complete  Social Work Consult for Recovery Care Planning/Counseling Complete  Palliative Care Screening Not Applicable   Medication Review Ocean[facility]) Complete

## 2023-02-13 NOTE — [place] FL2 (Signed)
[place] MEDICAID FL2 LEVEL OF CARE FORM     IDENTIFICATION  Patient Name: Shelly Freeman Birthdate: 1950/11/05 Sex: female Admission Date (Current Location): 02/09/2023  [place] and [place] Number:  Chiropodist and Address:  [facility], [address] [place], [place] [address]      Provider Number: [id number]  Attending Physician Name and Address:  Jonah Blue, MD  Relative Name and Phone Number:  Elliemay Arnoldy [phone]    Current Level of Care: Hospital Recommended Level of Care: Skilled Nursing Facility Prior Approval Number:    Date Approved/Denied:   PASRR Number: Pending  Discharge Plan: SNF    Current Diagnoses: Patient Active Problem List   Diagnosis Date Noted   Chronic pain syndrome 02/13/2023   Lumbar radicular pain, acute on chronic, intractable 02/10/2023   History of nephrolithiasis 02/10/2023   Hypotension due to medication 02/10/2023   Intractable pain 02/10/2023   Sleep apnea    Cystitis 09/13/2022   History of ESBL E. 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   Bilateral pneumonia 08/29/2020   AKI (acute kidney injury) (HCC) 08/29/2020   COPD (chronic obstructive pulmonary disease) (HCC) 08/29/2020   Fall at home, initial encounter 08/29/2020   CAP (community acquired pneumonia) 08/29/2020   Osteoarthritis of left knee 10/01/2019   Diarrhea 09/17/2019   Dizziness 09/17/2019   Other  fatigue 06/09/2019   Shortness of breath 06/09/2019   Obesity, Class III, BMI 40-49.9 (morbid obesity) (HCC) 05/23/2019   UTI (urinary tract infection) 05/23/2019   Right rib fracture 05/23/2019   SIRS (systemic inflammatory response syndrome) (HCC) 05/23/2019   Bacteremia due to Gram-negative bacteria 05/23/2019   Chest pain at rest 05/19/2019   Pain in rib 05/19/2019   Acute bronchiolitis 01/28/2019   Iron deficiency anemia due to chronic blood loss 10/29/2018   Primary osteoarthritis involving multiple joints 10/29/2018   Right carpal tunnel syndrome 07/31/2017   Cervical radiculopathy 07/16/2017   Left anterior knee pain 10/24/2016   Right upper quadrant pain 10/24/2016   Urination pain 06/26/2016   DDD (degenerative disc disease), cervical 05/14/2016   Lipoma of skin 05/14/2016   Vitamin D deficiency 05/14/2016   Major neurocognitive disorder, due to vascular disease, with behavioral disturbance, mild (HCC) 04/15/2016   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/04/2016   Lupus 04/04/2016   GERD (gastroesophageal reflux disease) 04/04/2016   COPD with acute bronchitis (HCC) 04/04/2016   Acute respiratory failure with hypoxia (HCC) 04/03/2016   Encephalopathy, metabolic 04/03/2016   Hypokalemia 04/03/2016   Pain in limb 04/03/2016   Hypothyroidism 04/03/2016   Essential hypertension 04/03/2016   Intentional opiate overdose (HCC) 03/30/2016   Age related osteoporosis 03/19/2016   Right arm pain 03/19/2016   Encounter for long-term (current) use of high-risk medication 03/19/2016   Primary osteoarthritis of both knees 03/19/2016    Orientation RESPIRATION BLADDER Height & Weight     Self, Time, Situation, Place  Normal Incontinent Weight: 98 kg Height:  5\' 1"  (154.9  cm)  BEHAVIORAL SYMPTOMS/MOOD NEUROLOGICAL BOWEL NUTRITION STATUS      Continent  (See Discharge Summary)  AMBULATORY STATUS COMMUNICATION OF NEEDS Skin   Extensive Assist Verbally  Normal                       Personal Care Assistance Level of Assistance  Bathing, Feeding, Dressing Bathing Assistance: Limited assistance Feeding assistance: Limited assistance Dressing Assistance: Limited assistance     Functional Limitations Info  Sight, Hearing, Speech Sight Info: Adequate Hearing Info: Adequate Speech Info: Adequate    SPECIAL CARE FACTORS FREQUENCY  PT (By licensed PT), OT (By licensed OT)     PT Frequency: 5X weekly OT Frequency: 5x Weekly            Contractures Contractures Info: Not present    Additional Factors Info  Code Status, Allergies Code Status Info: DNR-Interven Allergies Info: Cholestyramine           Current Medications (02/13/2023):  This is the current hospital active medication list Current Facility-Administered Medications  Medication Dose Route Frequency Provider Last Rate Last Admin   acetaminophen (TYLENOL) tablet 650 mg  650 mg Oral Q6H PRN Andris Baumann, MD       Or   acetaminophen (TYLENOL) suppository 650 mg  650 mg Rectal Q6H PRN Andris Baumann, MD       atorvastatin (LIPITOR) tablet 40 mg  40 mg Oral Noemi Chapel, MD       buprenorphine (BUTRANS) 7.5 MCG/HR 1 patch  1 patch Transdermal Weekly Jonah Blue, MD       busPIRone (BUSPAR) tablet 15 mg  15 mg Oral BID Lindajo Royal V, MD   15 mg at 02/13/23 1029   celecoxib (CELEBREX) capsule 100 mg  100 mg Oral BID Jonah Blue, MD   100 mg at 02/13/23 1533   dexamethasone (DECADRON) injection 4 mg  4 mg Intravenous Q8H Lindajo Royal V, MD   4 mg at 02/13/23 1437   escitalopram (LEXAPRO) tablet 20 mg  20 mg Oral Daily Lindajo Royal V, MD   20 mg at 02/13/23 1027   famotidine (PEPCID) tablet 20 mg  20 mg Oral BID Andris Baumann, MD   20 mg at 02/13/23 1028   fluticasone (FLONASE) 50 MCG/ACT nasal spray 1 spray  1 spray Each Nare Daily Jonah Blue, MD   1 spray at 02/13/23 1532   gabapentin (NEURONTIN) capsule 600 mg  600 mg Oral TID Andris Baumann, MD   600 mg at 02/13/23 1533   HYDROmorphone (DILAUDID) injection 0.5 mg  0.5 mg Intravenous Q4H PRN Marrion Coy, MD   0.5 mg at 02/13/23 1029   ipratropium-albuterol (DUONEB) 0.5-2.5 (3) MG/3ML nebulizer solution 3 mL  3 mL Nebulization Q6H PRN Andris Baumann, MD       levothyroxine (SYNTHROID) tablet 100 mcg  100 mcg Oral Q0600 Andris Baumann, MD   100 mcg at 02/13/23 0438   loratadine (CLARITIN) tablet 10 mg  10 mg Oral Daily Jonah Blue, MD   10 mg at 02/13/23 1435   methocarbamol (ROBAXIN) tablet 500 mg  500 mg Oral Q6H PRN Marrion Coy, MD   500 mg at 02/12/23 0836   metoprolol succinate (TOPROL-XL) 24 hr tablet 25 mg  25 mg Oral Daily Marrion Coy, MD   25 mg at 02/13/23 1028   mometasone-formoterol (DULERA) 200-5 MCG/ACT inhaler 2 puff  2 puff Inhalation BID Jonah Blue, MD  ondansetron (ZOFRAN) tablet 4 mg  4 mg Oral Q6H PRN Andris Baumann, MD       Or   ondansetron [facility]) injection 4 mg  4 mg Intravenous Q6H PRN Andris Baumann, MD   4 mg at 02/10/23 6606   Oral care mouth rinse  15 mL Mouth Rinse PRN Marrion Coy, MD       oxyCODONE-acetaminophen (PERCOCET/ROXICET) 5-325 MG per tablet 1 tablet  1 tablet Oral Q4H PRN Marrion Coy, MD   1 tablet at 02/13/23 1435   pantoprazole (PROTONIX) EC tablet 20 mg  20 mg Oral Daily Lindajo Royal V, MD   20 mg at 02/13/23 1033   polyvinyl alcohol (LIQUIFILM TEARS) 1.4 % ophthalmic solution 2 drop  2 drop Both Eyes PRN Rockwell Alexandria, RPH       QUEtiapine (SEROQUEL) tablet 50 mg  50 mg Oral QHS Otelia Sergeant, RPH   50 mg at 02/12/23 2043   senna-docusate (Senokot-S) tablet 2 tablet  2 tablet Oral BID Marrion Coy, MD   2 tablet at 02/13/23 1028   traZODone (DESYREL) tablet 125 mg  125 mg Oral QHS Andris Baumann, MD   125 mg at 02/12/23 2043   traZODone (DESYREL) tablet 25 mg  25 mg Oral QHS PRN Andris Baumann, MD       trimethoprim (TRIMPEX) tablet 100 mg  100 mg Oral Daily Jonah Blue, MD   100 mg at 02/13/23  1533     Discharge Medications: Please see discharge summary for a list of discharge medications.  Relevant Imaging Results:  Relevant Lab Results:   Additional Information [id number]  Garret Reddish, RN

## 2023-02-13 NOTE — Progress Notes (Signed)
RE: Shelly Freeman Date of Birth: Jan 15, 1951 Date: 02/13/2023     To Whom It May Concern:   Please be advised that the above-named patient will require a short-term nursing home stay - anticipated 30 days or less for rehabilitation and strengthening.  The plan is for return home

## 2023-02-13 NOTE — Progress Notes (Signed)
Progress Note   Patient: Shelly Freeman ZOX:[id number] DOB: November 25, 1950 DOA: 02/09/2023     3 DOS: the patient was seen and examined on 02/13/2023   Brief hospital course: 72yo with medical history significant for COPD, GERD, nephrolithiasis, hypothyroidism, lupus, depression, and chronic pain syndrome who presented on 10/5 with intractable back pain.  She was last hospitalized in May 2024 with right-sided hydroureteronephrosis due to suspected passed calculus.  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  Patient is treated with pain medicine and steroids, scheduled for injection by IR 10/8.  Patient is currently pending nursing home placement.  Assessment and Plan:  Lumbar radicular pain, acute on chronic, intractable Patient with chronic pain on buprenorphine and Celebrex PTA Presented with intractable back pain, found to have severe R and moderate L stenosis at L5-S1 Patient received IV steroids, pain medicine Reporting no significant pain today Will resume home medications - buprenorphine, gabapentin, and Celebrex Underwent successful epidural steroid injection on 10/7 and is now having significant pain relief Pending SNF rehab through [facility] I have reviewed this patient in the [place] Controlled Substances Reporting System.  She is receiving medications from only one provider and appears to be taking them as prescribed. She is at increased risk of opioid misuse, diversion, or overdose.    History of nephrolithiasis No evidence of nephrolithiasis on imaging   Essential hypertension Hypotensive due to medication after arrival Blood pressure recovered, restarted home dose of metoprolol.   COPD (chronic obstructive pulmonary disease) (HCC) Not acutely exacerbated Continue home Advair (Dulera per formulary)   Hypothyroidism Continue levothyroxine   Obstructive sleep apnea Continue CPAP  Obesity, Class III, BMI 40-49.9 (morbid obesity)  Body mass  index is 40.81 kg/m.[name]  Weight loss should be encouraged Outpatient PCP/bariatric medicine  f/u encouraged   HLD Continue atorvastatin  Depression Continue Lexapro, trazodone   Obesity -Body mass index is 40.81 kg/m..  -Weight loss should be encouraged -Outpatient PCP/bariatric medicine/bariatric surgery f/u encouraged    Consultants: IR PT OT  Procedures: Epidural steroid injection 10/7  Antibiotics: None  30 Day Unplanned Readmission Risk Score    Flowsheet Row ED to Hosp-Admission (Current) from 02/09/2023 in [facility] (1A)  30 Day Unplanned Readmission Risk Score (%) 22.01 Filed at 02/13/2023 0801       This score is the patient's risk of an unplanned readmission within 30 days of being discharged (0 -100%). The score is based on dignosis, age, lab data, medications, orders, and past utilization.   Low:  0-14.9   Medium: 15-21.9   High: 22-29.9   Extreme: 30 and above           Subjective: Reports feeling better, no significant pain.  She lives alone and initially wanted to go home with [facility] but agrees with need for SNF at this time.   Objective: Vitals:   02/13/23 0044 02/13/23 0732  BP: (!) 130/57 (!) 141/60  Pulse: (!) 52 (!) 45  Resp: 16 16  Temp: 98.2 F (36.8 C) 97.8 F (36.6 C)  SpO2: 94% 94%    Intake/Output Summary (Last 24 hours) at 02/13/2023 1415 Last data filed at 02/13/2023 0507 Gross per 24 hour  Intake --  Output 1100 ml  Net -1100 ml   Filed Weights   02/09/23 1836  Weight: 98 kg    Exam:  General:  Appears calm and comfortable and is in NAD Eyes:  EOMI, normal lids, iris ENT:  grossly normal  hearing, lips & tongue, mmm Neck:  no LAD, masses or thyromegaly Cardiovascular:  RRR, no m/r/g. No LE edema.  Respiratory:   CTA bilaterally with no wheezes/rales/rhonchi.  Normal respiratory effort. Abdomen:  soft, NT, ND Skin:  no rash or induration seen on limited exam Musculoskeletal:   grossly normal tone BUE/BLE, good ROM, no bony abnormality Psychiatric:  grossly normal mood and affect, speech fluent and appropriate, AOx3 Neurologic:  CN 2-12 grossly intact, moves all extremities in coordinated fashion  Data Reviewed: I have reviewed the patient's lab results since admission.  Pertinent labs for today include:   Glucose 112 BUN 31 Normal CBC     Family Communication: None present; I left a voice mail message for her granddaughter  Disposition: Status is: Inpatient Remains inpatient appropriate because: unsafe disposition     Time spent: 50 minutes    Author: Jonah Blue, MD 02/13/2023 2:15 PM  For on call review [email or web address].

## 2023-02-13 NOTE — Progress Notes (Signed)
Physical Therapy Treatment Patient Details Name: Shelly Freeman MRN: [id number] DOB: [birth year] Today's Date: 02/13/2023   History of Present Illness Pt is a 72 y/o female admitted secondary to intractable back pain. 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 Per medical team, consider IR consult for transforaminal steroid injection. PMH including but not limited to COPD, GERD, nephrolithiasis hypothyroidism, lupus, depression, chronic pain syndrome hospitalized in May 2024 with right-sided hydroureteronephrosis due to suspected passed calculus.    PT Comments  Patient alert, agreeable to PT, interpretor utilized throughout session. The patient at rest stated her pain was 6/10, but demonstrated much more elevated pain with mobility though patient did not quantify. The patient was able to sit <> stand with RW and supervision, but effortful. She ambulated ~61ft in room with decreased gait velocity and antalgia evident throughout. No LOB. Pt requested to return to bed, minA for BLE assistance, all needs in reach. The patient would benefit from further skilled PT intervention to continue to progress towards goals.     If plan is discharge home, recommend the following: Assistance with cooking/housework;Assist for transportation;Help with stairs or ramp for entrance;A little help with walking and/or transfers;A little help with bathing/dressing/bathroom   Can travel by private vehicle     No  Equipment Recommendations  Other (comment) (TBD)    Recommendations for Other Services       Precautions / Restrictions Precautions Precautions: Fall Restrictions Weight Bearing Restrictions: No     Mobility  Bed Mobility Overal bed mobility: Needs Assistance Bed  Mobility: Sit to Supine       Sit to supine: Min assist   General bed mobility comments: extra time and Min/CGA for BLE management to limit back pain    Transfers Overall transfer level: Needs assistance Equipment used: Rolling walker (2 wheels) Transfers: Sit to/from Stand Sit to Stand: Supervision                Ambulation/Gait Ambulation/Gait assistance: Supervision Gait Distance (Feet): 25 Feet Assistive device: Rolling walker (2 wheels)         General Gait Details: very decreased gait velocity, limited by pain   Stairs             Wheelchair Mobility     Tilt Bed    Modified Rankin (Stroke Patients Only)       Balance Overall balance assessment: Needs assistance Sitting-balance support: Feet supported Sitting balance-Leahy Scale: Good Sitting balance - Comments: supervision static sitting   Standing balance support: During functional activity Standing balance-Leahy Scale: Good                              Cognition Arousal: Alert Behavior During Therapy: Anxious Overall Cognitive Status: Difficult to assess                                          Exercises      General Comments        Pertinent Vitals/Pain Pain Assessment Pain Assessment: 0-10 Pain Score: 6  Pain Location: 6/10 at rest, elevated with all mobility attempts but pt did not quantify Pain  Descriptors / Indicators: Grimacing, Moaning, Guarding Pain Intervention(s): Limited activity within patient's tolerance, Monitored during session, Repositioned, Patient requesting pain meds-RN notified    Home Living                          Prior Function            PT Goals (current goals can now be found in the care plan section) Progress towards PT goals: Progressing toward goals    Frequency    Min 1X/week      PT Plan      Co-evaluation              AM-PAC PT "6 Clicks" Mobility   Outcome Measure  Help  needed turning from your back to your side while in a flat bed without using bedrails?: A Little Help needed moving from lying on your back to sitting on the side of a flat bed without using bedrails?: A Little Help needed moving to and from a bed to a chair (including a wheelchair)?: A Little Help needed standing up from a chair using your arms (e.g., wheelchair or bedside chair)?: A Little Help needed to walk in hospital room?: A Little Help needed climbing 3-5 steps with a railing? : A Lot 6 Click Score: 17    End of Session   Activity Tolerance: Patient limited by pain Patient left: in bed;with call bell/phone within reach;with bed alarm set   PT Visit Diagnosis: Pain;Difficulty in walking, not elsewhere classified (R26.2);Other abnormalities of gait and mobility (R26.89) Pain - part of body:  (back)     Time: 1355-1418 PT Time Calculation (min) (ACUTE ONLY): 23 min  Charges:    $Therapeutic Activity: 8-22 mins PT General Charges $$ ACUTE PT VISIT: 1 Visit                     Olga Coaster PT, DPT 3:08 PM,02/13/23

## 2023-02-13 NOTE — Progress Notes (Signed)
Occupational Therapy Treatment Patient Details Name: Shelly Freeman MRN: [id number] DOB: Dec 27, 1950 Today's Date: 02/13/2023   History of present illness Pt is a 72 y/o female admitted secondary to intractable back pain. 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 Per medical team, consider IR consult for transforaminal steroid injection. PMH including but not limited to COPD, GERD, nephrolithiasis hypothyroidism, lupus, depression, chronic pain syndrome hospitalized in May 2024 with right-sided hydroureteronephrosis due to suspected passed calculus.   OT comments  Pt is supine in bed on arrival. Easily arousable and agreeable to OT session. Pt with initial pain of 9/10 however received pain meds from nurse immediately prior to session. Pt did not verbalize pain with movement, but continued to grimace. Pt performed all bed mobility with SUP, extra time and HOB elevated. Pt required SBA/SUP for STS to RW and mobility using RW to the bathroom. She demo toilet transfer with SBA and all aspects of toileting including hygiene after BM with SBA and extra time d/t pain. Educated pt on relaxation/breathing techniques. Hand hygiene performed in standing with no UE support with SBA. Pt returned to bed with all needs in place and will cont to require skilled acute OT services to maximize his safety and IND to return to PLOF.       If plan is discharge home, recommend the following:  A little help with walking and/or transfers;Assistance with cooking/housework;Assist for transportation;Help with stairs or ramp for entrance;A little help with bathing/dressing/bathroom   Equipment Recommendations  Tub/shower bench    Recommendations for Other Services      Precautions /  Restrictions Precautions Precautions: Fall Restrictions Weight Bearing Restrictions: No       Mobility Bed Mobility Overal bed mobility: Needs Assistance Bed Mobility: Supine to Sit     Supine to sit: Supervision, Used rails, HOB elevated     General bed mobility comments: increased time d/t pain    Transfers Overall transfer level: Needs assistance Equipment used: Rolling walker (2 wheels) Transfers: Sit to/from Stand Sit to Stand: Supervision           General transfer comment: SUP for STS from EOB to RW then SBA for mobility to bathroom using RW     Balance Overall balance assessment: Needs assistance Sitting-balance support: Feet supported Sitting balance-Leahy Scale: Good     Standing balance support: During functional activity Standing balance-Leahy Scale: Good                             ADL either performed or assessed with clinical judgement   ADL Overall ADL's : Needs assistance/impaired                         Toilet Transfer: Supervision/safety   Toileting- Clothing Manipulation and Hygiene: Supervision/safety         General ADL Comments: ambualted to toilet in bathroom and required SBA/SUP for toilet transfer to low toilet, able to manage hygiene and clothing with SBA    Extremity/Trunk Assessment         Cervical / Trunk Assessment Cervical / Trunk Assessment: Other exceptions Cervical / Trunk Exceptions: Imaging revealed 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 Correlate for left L4 and/or right S1  radiculopathy    Vision       Perception     Praxis      Cognition Arousal: Alert Behavior During Therapy: Anxious Overall Cognitive Status: Difficult to assess                                          Exercises       Shoulder Instructions       General Comments      Pertinent Vitals/ Pain       Pain Assessment Pain Assessment: 0-10 Pain Score: 9  Pain Location: back Pain Descriptors / Indicators: Grimacing, Moaning, Guarding Pain Intervention(s): Monitored during session (RN gave meds prior to session)  Home Living                                          Prior Functioning/Environment              Frequency  Min 1X/week        Progress Toward Goals  OT Goals(current goals can now be found in the care plan section)  Progress towards OT goals: Progressing toward goals  Acute Rehab OT Goals Patient Stated Goal: reduce pain and get stronger OT Goal Formulation: With patient Time For Goal Achievement: 02/25/23 Potential to Achieve Goals: Good  Plan      Co-evaluation                 AM-PAC OT "6 Clicks" Daily Activity     Outcome Measure   Help from another person eating meals?: None Help from another person taking care of personal grooming?: None Help from another person toileting, which includes using toliet, bedpan, or urinal?: A Little Help from another person bathing (including washing, rinsing, drying)?: A Little Help from another person to put on and taking off regular upper body clothing?: A Little Help from another person to put on and taking off regular lower body clothing?: A Little 6 Click Score: 20    End of Session Equipment Utilized During Treatment: Rolling walker (2 wheels)  OT Visit Diagnosis: Other abnormalities of gait and mobility (R26.89);Pain   Activity Tolerance Patient limited by pain   Patient Left in bed;with call bell/phone within reach;with bed alarm set   Nurse Communication Mobility status        Time: 1445-1515 OT Time Calculation (min): 30 min  Charges: OT General Charges $OT Visit: 1 Visit OT Treatments $Self Care/Home Management : 23-37 mins  [name], OTR/L  02/13/23, 3:20 PM  [name] E  [name] 02/13/2023, 3:17 PM

## 2023-02-13 NOTE — Plan of Care (Signed)

## 2023-02-14 ENCOUNTER — Other Ambulatory Visit: Payer: Self-pay

## 2023-02-14 DIAGNOSIS — M5416 Radiculopathy, lumbar region: Secondary | ICD-10-CM | POA: Diagnosis not present

## 2023-02-14 MED ORDER — METOPROLOL SUCCINATE ER 25 MG PO TB24: 25.0000 mg | ORAL_TABLET | Freq: Every day | ORAL | Status: DC

## 2023-02-14 MED ORDER — METHOCARBAMOL 500 MG PO TABS: 500.0000 mg | ORAL_TABLET | Freq: Four times a day (QID) | ORAL | Status: DC | PRN

## 2023-02-14 MED ORDER — SENNOSIDES-DOCUSATE SODIUM 8.6-50 MG PO TABS: 2.0000 | ORAL_TABLET | Freq: Two times a day (BID) | ORAL | Status: DC

## 2023-02-14 MED ORDER — BUPRENORPHINE 7.5 MCG/HR TD PTWK: 1.0000 | MEDICATED_PATCH | TRANSDERMAL | 0 refills | Status: DC

## 2023-02-14 NOTE — Discharge Summary (Signed)
Physician Discharge Summary   Patient: Shelly Freeman MRN: [id number] DOB: 06-Jul-1950  Admit date:     02/09/2023  Discharge date: 02/14/23  Discharge Physician: Jonah Blue   PCP: Clovis Pu, Arturo Morton, DO   Recommendations at discharge:   You are being discharged to skilled nursing facility for rehab/strengthening Continue home medications for back pain - buprenorphine, gabapentin, and Celebrex If medications are not working, the next option will be a nerve block through Interventional Radiology - which could be arranged as an outpatient Patient is a [facility] participant  Discharge Diagnoses: Principal Problem:   Lumbar radicular pain, acute on chronic, intractable Active Problems:   History of nephrolithiasis   Severe recurrent major depression with psychotic features (HCC)   COPD (chronic obstructive pulmonary disease) (HCC)   Hypothyroidism   Essential hypertension   Obesity, Class III, BMI 40-49.9 (morbid obesity) (HCC)   Sleep apnea   Chronic pain syndrome    Hospital Course: 72yo with medical history significant for COPD, GERD, nephrolithiasis, hypothyroidism, lupus, depression, and chronic pain syndrome who presented on 10/5 with intractable back pain.  She was last hospitalized in May 2024 with right-sided hydroureteronephrosis due to suspected passed calculus.  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  Patient is treated with pain medicine and steroids, scheduled for injection by IR 10/8.  Patient is currently pending nursing home placement.  Assessment and Plan:  Lumbar radicular pain, acute on chronic, intractable Patient with chronic pain on buprenorphine, gabapentin, and Celebrex PTA Presented with intractable back pain, found to have severe R and moderate L stenosis at L5-S1 Patient received IV steroids, pain medicine Underwent successful epidural steroid injection on 10/7 and with significant pain relief Reporting some  recurrent pain today but she was up in the bedside chair for some time Will resume home medications - buprenorphine, gabapentin, and Celebrex Pending SNF rehab through [facility] - this has been arranged for today and she will be discharged I have reviewed this patient in the [place] Controlled Substances Reporting System.  She is receiving medications from only one provider and appears to be taking them as prescribed. She is at increased risk of opioid misuse, diversion, or overdose.  If she continues to have intractable pain, IR would consider performing a selective nerve root block of the R L5 nerve root   History of nephrolithiasis No evidence of nephrolithiasis on imaging Continue trimethoprim for UTI prophylaxis   Essential hypertension Hypotensive due to medication after arrival Blood pressure recovered, restarted home dose of metoprolol.   COPD (chronic obstructive pulmonary disease) (HCC) Not acutely exacerbated Continue home Advair ([place] per formulary)   Hypothyroidism Continue levothyroxine   Obstructive sleep apnea Continue CPAP   Obesity, Class III, BMI 40-49.9 (morbid obesity)  Body mass index is 40.81 kg/m.[name]  Weight loss should be encouraged Outpatient PCP/bariatric medicine  f/u encouraged    HLD Continue atorvastatin   Depression Continue Lexapro, trazodone, buspirone, Seroquel   Morbid Obesity Body mass index is 40.81 kg/m.[name]  Weight loss should be encouraged Outpatient PCP/bariatric medicine f/u encouraged  This issue will negatively impact her morbidity and mortality associated with back issues as well as in general  DNR Per discussion with Dr. Chipper Herb on 10/7, she is DNR She will need an out of facility DNR form at the time of dc     Consultants: IR PT OT TOC team   Procedures: Epidural steroid injection 10/7   Antibiotics: None   30 Day Unplanned  Readmission Risk Score    Flowsheet Row ED to Hosp-Admission (Current) from 02/09/2023 in [facility] (1A)  30 Day Unplanned Readmission Risk Score (%) 20.73 Filed at 02/14/2023 0400       This score is the patient's risk of an unplanned readmission within 30 days of being discharged (0 -100%). The score is based on dignosis, age, lab data, medications, orders, and past utilization.   Low:  0-14.9   Medium: 15-21.9   High: 22-29.9   Extreme: 30 and above            Pain control - [place] Controlled Substance Reporting System database was reviewed. and patient was instructed, not to drive, operate heavy machinery, perform activities at heights, swimming or participation in water activities or provide baby-sitting services while on Pain, Sleep and Anxiety Medications; until their outpatient Physician has advised to do so again. Also recommended to not to take more than prescribed Pain, Sleep and Anxiety Medications.   Disposition: Skilled nursing facility Diet recommendation:  Regular diet DISCHARGE MEDICATION: Allergies as of 02/14/2023       Reactions   Cholestyramine Other (See Comments)   severe        Medication List     STOP taking these medications    ipratropium-albuterol 0.5-2.5 (3) MG/3ML Soln Commonly known as: DUONEB   Symbicort 80-4.5 MCG/ACT inhaler Generic drug: budesonide-formoterol       TAKE these medications    acetaminophen 650 MG CR tablet Commonly known as: TYLENOL Take 650 mg by mouth 3 (three) times daily as needed for pain.   Advair HFA 115-21 MCG/ACT inhaler Generic drug: fluticasone-salmeterol Inhale 2 puffs into the lungs 2 (two) times daily.   albuterol 108 (90 Base) MCG/ACT inhaler Commonly known as: VENTOLIN HFA Inhale into the lungs. Inhale 2 puffs into the lungs every 4 (four) hours as needed for wheezing or shortness of breath.   alendronate 70 MG tablet Commonly known as: FOSAMAX Take 70 mg by mouth once a week. Take with a full glass of water on an empty stomach.   atorvastatin  40 MG tablet Commonly known as: LIPITOR Take 40 mg by mouth at bedtime.   benzonatate 200 MG capsule Commonly known as: TESSALON Take 200 mg by mouth 3 (three) times daily as needed for cough.   buprenorphine 7.5 MCG/HR Commonly known as: BUTRANS Place 1 patch onto the skin once a week. (Apply one patch to upper back/shoulder, outer arm, or upper chest alternating sites weekly on Wednesday at [facility]. May reuse site every 21 days.)   busPIRone 15 MG tablet Commonly known as: BUSPAR Take 15 mg by mouth 2 (two) times daily.   celecoxib 100 MG capsule Commonly known as: CELEBREX Take 1 capsule by mouth 2 (two) times daily.   cetirizine 10 MG tablet Commonly known as: ZYRTEC Take 10 mg by mouth daily.   cholecalciferol 25 MCG (1000 UNIT) tablet Commonly known as: VITAMIN D3 Take 1,000 Units by mouth daily.   cyanocobalamin 1000 MCG tablet Commonly known as: VITAMIN B12 Take 1,000 mcg by mouth daily.   Dextromethorphan-guaiFENesin 10-100 MG/5ML liquid Take 10 mLs by mouth 3 (three) times daily as needed (for cough and nasal congestion).   diclofenac Sodium 1 % Gel Commonly known as: VOLTAREN Apply 4 g topically 4 (four) times daily. For pain in the right knee. Rub into knee for 1 minute for each application.   escitalopram 20 MG tablet Commonly known as: LEXAPRO Take 20  mg by mouth daily.   estradiol 0.1 MG/GM vaginal cream Commonly known as: ESTRACE Place 0.5 g vaginally daily. Insert 1/2 gram into vagina every night as directed for two weeks then use twice weekly on Monday and Friday nights only   famotidine 20 MG tablet Commonly known as: PEPCID Take 20 mg by mouth 2 (two) times daily.   ferrous sulfate 325 (65 FE) MG tablet Take 325 mg by mouth every Monday, Wednesday, and Friday.   fluticasone 50 MCG/ACT nasal spray Commonly known as: FLONASE Place 1 spray into both nostrils daily.   folic acid 1 MG tablet Commonly known as: FOLVITE Take 1 mg by mouth daily.    gabapentin 600 MG tablet Commonly known as: NEURONTIN Take 600 mg by mouth 3 (three) times daily.   levothyroxine 100 MCG tablet Commonly known as: SYNTHROID Take 100 mcg by mouth daily before breakfast.   psyllium 58.6 % powder Commonly known as: METAMUCIL Take 1 packet by mouth daily.   Metamucil 0.36 g Caps Generic drug: Psyllium Take 0.4 g by mouth daily. Take 4 capsules by mouth daily   methocarbamol 500 MG tablet Commonly known as: ROBAXIN Take 1 tablet (500 mg total) by mouth every 6 (six) hours as needed for muscle spasms.   metoprolol succinate 25 MG 24 hr tablet Commonly known as: TOPROL-XL Take 1 tablet (25 mg total) by mouth daily. Start taking on: February 15, 2023   nystatin powder Apply 1 Application topically 2 (two) times daily. Apply moderate amount to groin and under breast twice daily, please keep area dry.   OPTIVE 0.5-0.9 % ophthalmic solution Generic drug: carboxymethylcellul-glycerin Place 2 drops into both eyes 4 (four) times daily as needed (for irritated eyes).   pantoprazole 20 MG tablet Commonly known as: PROTONIX Take 20 mg by mouth daily.   phenazopyridine 100 MG tablet Commonly known as: PYRIDIUM Take 100 mg by mouth 3 (three) times daily as needed (for burning pain during urination).   QUEtiapine 50 MG tablet Commonly known as: SEROQUEL Take 50 mg by mouth at bedtime.   senna-docusate 8.6-50 MG tablet Commonly known as: Senokot-S Take 2 tablets by mouth 2 (two) times daily.   traZODone 100 MG tablet Commonly known as: DESYREL Take 125 mg by mouth at bedtime.   traZODone 50 MG tablet Commonly known as: DESYREL Take 25 mg by mouth at bedtime as needed for sleep.   trimethoprim 100 MG tablet Commonly known as: TRIMPEX Take 1 tablet (100 mg total) by mouth daily.   Vitamin D (Ergocalciferol) 1.25 MG (50000 UNIT) Caps capsule Commonly known as: DRISDOL Take 50,000 Units by mouth once a week. On Mondays        Discharge  Exam:   Subjective: Sitting up in the bedside chair, asking for her watch and to get back in pain.  Some back pain.   Objective: Vitals:   02/14/23 1010 02/14/23 1055  BP:    Pulse:    Resp: 20 18  Temp:    SpO2:      Intake/Output Summary (Last 24 hours) at 02/14/2023 1241 Last data filed at 02/14/2023 0900 Gross per 24 hour  Intake 360 ml  Output 1100 ml  Net -740 ml   Filed Weights   02/09/23 1836  Weight: 98 kg    Exam:  General:  Appears calm and comfortable and is in NAD Eyes:  EOMI, normal lids, iris ENT:  grossly normal hearing, lips & tongue, mmm Neck:  no LAD, masses or  thyromegaly Cardiovascular:  RRR, no m/r/g. No LE edema.  Respiratory:   CTA bilaterally with no wheezes/rales/rhonchi.  Normal respiratory effort. Abdomen:  soft, NT, ND Skin:  no rash or induration seen on limited exam, bandage in place on low back Musculoskeletal:  grossly normal tone BUE/BLE, no bony abnormality Psychiatric:  grossly normal mood and affect, speech fluent and appropriate, AOx3 Neurologic:  CN 2-12 grossly intact, moves all extremities in coordinated fashion  Data Reviewed: I have reviewed the patient's lab results since admission.  Pertinent labs for today include:  None today     Condition at discharge: improving  The results of significant diagnostics from this hospitalization (including imaging, microbiology, ancillary and laboratory) are listed below for reference.   Imaging Studies: IR INJECT DIAG/THERA/INC NEEDLE/CATH/PLC EPI/LUMB/SAC W/IMG  Result Date: 02/12/2023 CLINICAL DATA:  Lower back pain with right lower extremity radiculopathy FLUOROSCOPY: Radiation Exposure Index (as provided by the fluoroscopic device): 0.8 minutes (25 mGy) PROCEDURE: The procedure, risks, benefits, and alternatives were explained to the patient. Questions regarding the procedure were encouraged and answered. The patient understands and consents to the procedure. LUMBAR EPIDURAL  INJECTION: An interlaminar approach was performed on right at L5-S1. The overlying skin was cleansed and anesthetized. A 20 gauge epidural needle was advanced using loss-of-resistance technique. DIAGNOSTIC EPIDURAL INJECTION: Injection of Isovue-M 200 shows a good epidural pattern with spread above and below the level of needle placement, primarily on the right. No vascular opacification is seen. THERAPEUTIC EPIDURAL INJECTION: 80 Mg of Depo-Medrol mixed with 2 mL 1% lidocaine were instilled. The procedure was well-tolerated, and the patient was discharged thirty minutes following the injection in good condition. COMPLICATIONS: None immediate IMPRESSION: Successful epidural steroid injection at L5-S1 via a right interlaminar approach. If the patient does not experience significant relief from this injection, may consider performing a selective nerve root block of the right L5 nerve root. Electronically Signed   By: Olive Bass M.D.   On: 02/12/2023 10:11   MR LUMBAR SPINE WO CONTRAST  Result Date: 02/09/2023 CLINICAL DATA:  Lumbar radiculopathy EXAM: MRI LUMBAR SPINE WITHOUT CONTRAST TECHNIQUE: Multiplanar, multisequence MR imaging of the lumbar spine was performed. No intravenous contrast was administered. COMPARISON:  None Available. FINDINGS: Segmentation:  Standard. Alignment:  Physiologic. Vertebrae: No fracture, evidence of discitis, or aggressive bone lesion. There is a hemangioma at T12. Conus medullaris and cauda equina: Conus extends to the L1 level. Conus and cauda equina appear normal. Paraspinal and other soft tissues: Negative. Disc levels: L1-L2: Normal disc space and facet joints. No spinal canal stenosis. No neural foraminal stenosis. L2-L3: Small left subarticular disc protrusion. No spinal canal stenosis. No neural foraminal stenosis. L3-L4: Left asymmetric disc bulge with mild narrowing of the left lateral recess. No spinal canal stenosis. No neural foraminal stenosis. L4-L5: Small central  disc protrusion and mild facet arthrosis. No spinal canal stenosis. No neural foraminal stenosis. L5-S1: Right asymmetric disc bulge with narrowing of the right lateral recess. Right-greater-than-left facet arthrosis. No spinal canal stenosis. Severe right and moderate left neural foraminal stenosis. Visualized sacrum: Normal. IMPRESSION: 1. 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 2. Mild narrowing of the left lateral recess at L3-L4 and right lateral recess at L5-S1. Correlate for left L4 and/or right S1 radiculopathy. 3. No spinal canal stenosis. Electronically Signed   By: Deatra Robinson M.D.   On: 02/09/2023 23:28   CT ABDOMEN PELVIS W CONTRAST  Result  Date: 02/09/2023 CLINICAL DATA:  severe left flank pain, recent UTI, concern for pyelonephritis vs kidney stone EXAM: CT ABDOMEN AND PELVIS WITH CONTRAST TECHNIQUE: Multidetector CT imaging of the abdomen and pelvis was performed using the standard protocol following bolus administration of intravenous contrast. RADIATION DOSE REDUCTION: This exam was performed according to the departmental dose-optimization program which includes automated exposure control, adjustment of the mA and/or kV according to patient size and/or use of iterative reconstruction technique. CONTRAST:  80mL OMNIPAQUE IOHEXOL 300 MG/ML  SOLN COMPARISON:  09/13/2022 FINDINGS: Lower chest: No basilar airspace disease or pleural effusion Hepatobiliary: The liver is mildly enlarged spanning 19.5 cm cranial caudal. No focal hepatic abnormality. Gallbladder physiologically distended, no calcified stone. No biliary dilatation. Pancreas: No ductal dilatation or inflammation. Spleen: Normal in size without focal abnormality. Adrenals/Urinary Tract: No adrenal nodule. Multifocal renal parenchymal scarring. No hydronephrosis. No renal calculi. No perinephric inflammation. Homogeneous enhancement with symmetric excretion on  delayed phase imaging. Decompressed ureters. Minimally distended urinary bladder, normal for degree of distension. Stomach/Bowel: Postsurgical change in the stomach. Gastric suture line crosses the diaphragmatic hiatus. There is a small hiatal hernia. No bowel obstruction or inflammation. Enteric suture line in the left abdomen. Moderate volume of colonic stool. The appendix is not seen. Vascular/Lymphatic: Normal caliber abdominal aorta. Patent portal vein. No acute vascular findings. No abdominopelvic adenopathy. Reproductive: No mass. Other: No free air or ascites.  No abdominal wall hernia. Musculoskeletal: Lumbar degenerative change, primarily at L5-S1. There are no acute or suspicious osseous abnormalities. IMPRESSION: 1. No acute abnormality in the abdomen/pelvis. No renal stones or obstructive uropathy. No CT findings of pyelonephritis. 2. Multifocal renal parenchymal scarring. 3. Mild hepatomegaly. 4. Postsurgical change in the stomach with small hiatal hernia. Electronically Signed   By: Narda Rutherford M.D.   On: 02/09/2023 20:53    Microbiology: Results for orders placed or performed in visit on 12/24/22  Microscopic Examination     Status: Abnormal   Collection Time: 12/24/22 10:30 AM   Urine  Result Value Ref Range Status   WBC, UA 11-30 (A) 0 - 5 /hpf Final   RBC, Urine 0-2 0 - 2 /hpf Final   Epithelial Cells (non renal) >10 (A) 0 - 10 /hpf Final   Casts Present (A) None seen /lpf Final   Cast Type Hyaline casts N/A Final   Bacteria, UA Moderate (A) None seen/Few Final  CULTURE, URINE COMPREHENSIVE     Status: None   Collection Time: 12/24/22 11:03 AM   Specimen: Urine   UR  Result Value Ref Range Status   Urine Culture, Comprehensive Final report  Final   Organism ID, Bacteria Comment  Final    Comment: Mixed urogenital flora Less than 10,000 colonies/mL     Labs: CBC: Recent Labs  Lab 02/09/23 1838 02/12/23 0709 02/13/23 0847  WBC 7.8 10.6* 10.0  NEUTROABS  --   --   8.5*  HGB 12.8 13.2 13.4  HCT 41.6 41.9 41.9  MCV 95.2 93.1 92.9  PLT 201 203 192   Basic Metabolic Panel: Recent Labs  Lab 02/09/23 1838 02/10/23 0822 02/11/23 0252 02/13/23 0847  NA 139 138 138 136  K 5.4* 5.2* 4.8 4.7  CL 106 104 102 102  CO2 24 26 26 22   GLUCOSE 88 158* 139* 112*  BUN 22 22 24* 31*  CREATININE 1.36* 0.93 1.02* 0.82  CALCIUM 9.7 9.1 9.3 9.1   Liver Function Tests: No results for input(s): "AST", "ALT", "ALKPHOS", "BILITOT", "PROT", "ALBUMIN"  in the last 168 hours. CBG: No results for input(s): "GLUCAP" in the last 168 hours.  Discharge time spent: greater than 30 minutes.  Signed: Jonah Blue, MD [facility] 02/14/2023

## 2023-02-14 NOTE — TOC Progression Note (Signed)
Transition of Care [facility]) - Progression Note    Patient Details  Name: Shelly Freeman MRN: [id number] Date of Birth: 04-Oct-1950  Transition of Care [facility]) CM/SW Contact  Marlowe Sax, RN Phone Number: 02/14/2023, 10:06 AM  Clinical Narrative:     PASSR number [id number] A I reached out to [facility] and [facility] at [facility] does not have a [facility] bed available, [facility] will review for a bed offer and let me know if they can accept  Expected Discharge Plan: Skilled Nursing Facility Barriers to Discharge: SNF Pending bed offer  Expected Discharge Plan and Services   Discharge Planning Services: CM Consult   Living arrangements for the past 2 months: Single Family Home                 DME Arranged: N/A         HH Arranged: NA           Social Determinants of Health (SDOH) Interventions SDOH Screenings   Food Insecurity: No Food Insecurity (02/14/2023)  Housing: Patient Declined (02/14/2023)  Transportation Needs: No Transportation Needs (02/14/2023)  Utilities: Not At Risk (02/14/2023)  Alcohol Screen: Low Risk  (12/09/2017)  Tobacco Use: Low Risk  (02/11/2023)    Readmission Risk Interventions    01/28/2021   12:19 PM  Readmission Risk Prevention Plan  Transportation Screening Complete  PCP or Specialist Appt within 3-5 Days Complete  HRI or Home Care Consult Complete  Social Work Consult for Recovery Care Planning/Counseling Complete  Palliative Care Screening Not Applicable  Medication Review Oceanographer) Complete

## 2023-02-14 NOTE — TOC Transition Note (Signed)
Transition of Care [facility]) - CM/SW Discharge Note   Patient Details  Name: Shelly Freeman MRN: [id number] Date of Birth: [birth year]  Transition of Care [facility]) CM/SW Contact:  Marlowe Sax, RN Phone Number: 02/14/2023, 12:48 PM   Clinical Narrative:    [facility] will arrange Transport, I called and left a general VM asking for a call back for the patient's grand daughter to let them know about the DC,    Final next level of care: Skilled Nursing Facility Barriers to Discharge: SNF Pending bed offer   Patient Goals and CMS Choice      Discharge Placement                         Discharge Plan and Services Additional resources added to the After Visit Summary for     Discharge Planning Services: CM Consult            DME Arranged: N/A         HH Arranged: NA          Social Determinants of Health (SDOH) Interventions SDOH Screenings   Food Insecurity: No Food Insecurity (02/14/2023)  Housing: Patient Declined (02/14/2023)  Transportation Needs: No Transportation Needs (02/14/2023)  Utilities: Not At Risk (02/14/2023)  Alcohol Screen: Low Risk  (12/09/2017)  Tobacco Use: Low Risk  (02/11/2023)     Readmission Risk Interventions    01/28/2021   12:19 PM  Readmission Risk Prevention Plan  Transportation Screening Complete  PCP or Specialist Appt within 3-5 Days Complete  HRI or Home Care Consult Complete  Social Work Consult for Recovery Care Planning/Counseling Complete  Palliative Care Screening Not Applicable  Medication Review Oceanographer) Complete

## 2023-02-14 NOTE — Plan of Care (Signed)

## 2023-02-14 NOTE — Care Management Important Message (Signed)
Important Message  Patient Details  Name: Shelly Freeman MRN: [id number] Date of Birth: 07-15-1950   Important Message Given:  Yes - Medicare IM     Verita Schneiders Cire Deyarmin 02/14/2023, 1:26 PM

## 2023-02-18 ENCOUNTER — Encounter: Payer: Self-pay | Admitting: Urology

## 2023-02-18 ENCOUNTER — Ambulatory Visit (INDEPENDENT_AMBULATORY_CARE_PROVIDER_SITE_OTHER): Payer: Medicare (Managed Care) | Admitting: Urology

## 2023-02-18 VITALS — BP 128/79 | HR 81 | Ht 61.0 in | Wt 216.0 lb

## 2023-02-18 DIAGNOSIS — N3946 Mixed incontinence: Secondary | ICD-10-CM | POA: Diagnosis not present

## 2023-02-18 DIAGNOSIS — N8110 Cystocele, unspecified: Secondary | ICD-10-CM | POA: Diagnosis not present

## 2023-02-18 LAB — URINALYSIS, COMPLETE
Bilirubin, UA: NEGATIVE
Glucose, UA: NEGATIVE
Ketones, UA: NEGATIVE
Leukocytes,UA: NEGATIVE
Nitrite, UA: NEGATIVE
Specific Gravity, UA: 1.025 (ref 1.005–1.030)
Urobilinogen, Ur: 0.2 mg/dL (ref 0.2–1.0)
pH, UA: 5.5 (ref 5.0–7.5)

## 2023-02-18 LAB — MICROSCOPIC EXAMINATION: Epithelial Cells (non renal): >10 /[HPF] — AB (ref 0–10)

## 2023-02-18 NOTE — Progress Notes (Signed)
02/18/2023 9:59 AM   Shelly Freeman [birth year] [id number]  Referring provider: Clovis Pu, Arturo Morton, DO [address] [place],  [place] [address]  Chief Complaint  Patient presents with   Cysto    HPI: SN: Patient seen by my partner for recurrent urinary tract infections and right-sided hydronephrosis with no stone.  20 years ago had a percutaneous procedure left kidney.  Has had a number lithotripsies saw a nurse practitioner and patient in May 2024 had pyelonephritis with positive culture.  The right sided hydronephrosis apparently waxes and wanes.  CT scan in March 2024 showed prominence of right collecting system with urothelial enhancement and right perinephric stranding and thickening of bladder in keeping with cystitis and ascending urinary tract infection.  She was given vaginal estrogen cream in May 2024.  Had 2 positive urine cultures in 2024.  Difficult historian even with translator.  Postvoid residual 27 mL.  Has milder frequency and nocturia.  Was on oxybutynin and Myrbetriq and the dose the Myrbetriq increased in July 2024.  A renal ultrasound in July 2024 demonstrated no hydronephrosis.  Patient had an abnormality on the CT scan in May 2024 near the vaginal cuff and is being followed by Dr. Dalbert Garnet.  2 with the last 3 urine cultures have been positive.  The inflammation and thickening of the bladder was seen on CT scan in March 2024 associated with the urothelial thickening of the right collecting system and stranding.  These findings were greatly improved on February 09, 2023   Today Patient reports urge incontinence.  She leaks a modest amount with coughing sneezing bending and lifting.  I asked the translator to have her concentrate on each question carefully but she tends to have a positive functional inquiry.  The first thing she said is that she wanted a bladder lift when I came into the room.  She does have high-volume bedwetting.  She soaks 6 pads a day   She voids every 1  hour and cannot hold it for 2 hours.  She gets up 4-5 times a night.   She is on oxybutynin.  She stopped the Myrbetriq because she said it did not work   I do not think she has been treated for recent bladder infection in the last few weeks.  She does not think she is infected today.   She uses a walker for her knee.  She has had a hysterectomy.   The patient has mixed incontinence and high-volume bedwetting.  Likely the overactive bladder component is very significant.  It is important to control her urinary tract infections before we can help her incontinence.  She has had a lot of inflammation of her bladder as well as her right kidney radiographically.  She will come back for pelvic examination and cystoscopy.  I nicely told her that we need to control infections before we control incontinence and that we will likely need further testing in the future by did not discuss urodynamics specifically; and that immediately performing a bladder suspension is not her short-term treatment pathway.  Call if culture positive.  Trimethoprim 100 mg 30 x 11 sent    It turned out the patient may also been on Vesicare so I discontinued it and kept her only on oxybutynin.  Is difficult to say if any the medications gave false negatives because she has been infected so much over the last many months.  I again she had to be reminded although she has been incontinent for 2 years  we need to go through a stepwise approach    Today Frequency stable.  Last culture negative It appears the patient is taking the once a day antibiotic since its on the medical record although since she said she was not certain where she was not.  Still has incontinence both day and night  On pelvic examination she had mild grade 2 hypermobility the bladder neck and no stress incontinence before or after cystoscopy.  Very mild grade 1 cystocele and no rectocele  Cystoscopy: Patient underwent flexible cystoscopy.  There was a lot of white  flecks in the urine.  She had some mild erythema in the floor and posterior wall the bladder in keeping with cystitis.  No carcinoma.  No pain with bladder filling.  Urinalysis looked positive and sent for culture   PMH: Past Medical History:  Diagnosis Date   Anginal pain (HCC)    Arthritis    Collagen vascular disease (HCC)    COPD (chronic obstructive pulmonary disease) (HCC)    DDD (degenerative disc disease), lumbar 05/14/2016   Depression    Dizziness    Dyspnea    GERD (gastroesophageal reflux disease)    Heart valve problem    per grandaughter need surgery but they told her may not survive;   History of kidney stones    Hypertension    Hypothyroidism    Lupus    Neuromuscular disorder (HCC)    Sleep apnea    no CPAP   Thyroid disease    Vitamin D deficiency 05/14/2016    Surgical History: Past Surgical History:  Procedure Laterality Date   APPENDECTOMY     BREAST BIOPSY Left    neg   CATARACT EXTRACTION W/PHACO Left 11/13/2021   Procedure: CATARACT EXTRACTION PHACO AND INTRAOCULAR LENS PLACEMENT (IOC) LEFT 3.63 00:35.9;  Surgeon: Nevada Crane, MD;  Location: [facility];  Service: Ophthalmology;  Laterality: Left;   CATARACT EXTRACTION W/PHACO Right 12/04/2021   Procedure: CATARACT EXTRACTION PHACO AND INTRAOCULAR LENS PLACEMENT (IOC) RIGHT;  Surgeon: Nevada Crane, MD;  Location: [facility];  Service: Ophthalmology;  Laterality: Right;  3.11 00:32.6   CESAREAN SECTION     x 2   COLONOSCOPY N/A 08/24/2021   Procedure: COLONOSCOPY;  Surgeon: Jaynie Collins, DO;  Location: [facility] END[facility];  Service: Gastroenterology;  Laterality: N/A;  SPANISH INTERPRETER   COLONOSCOPY WITH PROPOFOL N/A 07/01/2017   Procedure: COLONOSCOPY WITH PROPOFOL;  Surgeon: Scot Jun, MD;  [facility];  Service: Endoscopy;  Laterality: N/A;   ESOPHAGOGASTRODUODENOSCOPY (EGD) WITH PROPOFOL N/A 07/01/2017   Procedure:  ESOPHAGOGASTRODUODENOSCOPY (EGD) WITH PROPOFOL;  Surgeon: Scot Jun, MD;  Location: [facility];  Service: Endoscopy;  Laterality: N/A;   GASTRIC BYPASS     IR INJECT/THERA/INC NEEDLE/CATH/PLC EPI/LUMB/SAC W/IMG  02/12/2023   LIPOMA EXCISION Left 06/14/2022   Procedure: EXCISION LIPOMA, left chest wall;  Surgeon: Leafy Ro, MD;  Location: [facility] ORS;  Service: General;  Laterality: Left;   LITHOTRIPSY     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   Procedure: TOTAL KNEE ARTHROPLASTY;  Surgeon: Kennedy Bucker, MD;  Location: [facility] ORS;  Service: Orthopedics;  Laterality: Left;   TUBAL LIGATION      Home Medications:  Allergies as of 02/18/2023       Reactions   Cholestyramine Other (See Comments)   severe        Medication List        Accurate as of February 18, 2023  9:59  AM. If you have any questions, ask your nurse or doctor.          acetaminophen 650 MG CR tablet Commonly known as: TYLENOL Take 650 mg by mouth 3 (three) times daily as needed for pain.   Advair HFA 115-21 MCG/ACT inhaler Generic drug: fluticasone-salmeterol Inhale 2 puffs into the lungs 2 (two) times daily.   albuterol 108 (90 Base) MCG/ACT inhaler Commonly known as: VENTOLIN HFA Inhale into the lungs. Inhale 2 puffs into the lungs every 4 (four) hours as needed for wheezing or shortness of breath.   alendronate 70 MG tablet Commonly known as: FOSAMAX Take 70 mg by mouth once a week. Take with a full glass of water on an empty stomach.   atorvastatin 40 MG tablet Commonly known as: LIPITOR Take 40 mg by mouth at bedtime.   benzonatate 200 MG capsule Commonly known as: TESSALON Take 200 mg by mouth 3 (three) times daily as needed for cough.   buprenorphine 7.5 MCG/HR Commonly known as: BUTRANS Place 1 patch onto the skin once a week. (Apply one patch to upper back/shoulder, outer arm, or upper chest alternating sites weekly on Wednesday at [facility] May reuse site every 21 days.)    busPIRone 15 MG tablet Commonly known as: BUSPAR Take 15 mg by mouth 2 (two) times daily.   celecoxib 100 MG capsule Commonly known as: CELEBREX Take 1 capsule by mouth 2 (two) times daily.   cetirizine 10 MG tablet Commonly known as: ZYRTEC Take 10 mg by mouth daily.   cholecalciferol 25 MCG (1000 UNIT) tablet Commonly known as: VITAMIN D3 Take 1,000 Units by mouth daily.   cyanocobalamin 1000 MCG tablet Commonly known as: VITAMIN B12 Take 1,000 mcg by mouth daily.   Dextromethorphan-guaiFENesin 10-100 MG/5ML liquid Take 10 mLs by mouth 3 (three) times daily as needed (for cough and nasal congestion).   diclofenac Sodium 1 % Gel Commonly known as: VOLTAREN Apply 4 g topically 4 (four) times daily. For pain in the right knee. Rub into knee for 1 minute for each application.   escitalopram 20 MG tablet Commonly known as: LEXAPRO Take 20 mg by mouth daily.   estradiol 0.1 MG/GM vaginal cream Commonly known as: ESTRACE Place 0.5 g vaginally daily. Insert 1/2 gram into vagina every night as directed for two weeks then use twice weekly on Monday and Friday nights only   famotidine 20 MG tablet Commonly known as: PEPCID Take 20 mg by mouth 2 (two) times daily.   ferrous sulfate 325 (65 FE) MG tablet Take 325 mg by mouth every Monday, Wednesday, and Friday.   fluticasone 50 MCG/ACT nasal spray Commonly known as: FLONASE Place 1 spray into both nostrils daily.   folic acid 1 MG tablet Commonly known as: FOLVITE Take 1 mg by mouth daily.   gabapentin 600 MG tablet Commonly known as: NEURONTIN Take 600 mg by mouth 3 (three) times daily.   levothyroxine 100 MCG tablet Commonly known as: SYNTHROID Take 100 mcg by mouth daily before breakfast.   psyllium 58.6 % powder Commonly known as: METAMUCIL Take 1 packet by mouth daily.   Metamucil 0.36 g Caps Generic drug: Psyllium Take 0.4 g by mouth daily. Take 4 capsules by mouth daily   methocarbamol 500 MG  tablet Commonly known as: ROBAXIN Take 1 tablet (500 mg total) by mouth every 6 (six) hours as needed for muscle spasms.   metoprolol succinate 25 MG 24 hr tablet Commonly known as: TOPROL-XL Take 1 tablet (  25 mg total) by mouth daily.   nystatin powder Apply 1 Application topically 2 (two) times daily. Apply moderate amount to groin and under breast twice daily, please keep area dry.   OPTIVE 0.5-0.9 % ophthalmic solution Generic drug: carboxymethylcellul-glycerin Place 2 drops into both eyes 4 (four) times daily as needed (for irritated eyes).   pantoprazole 20 MG tablet Commonly known as: PROTONIX Take 20 mg by mouth daily.   phenazopyridine 100 MG tablet Commonly known as: PYRIDIUM Take 100 mg by mouth 3 (three) times daily as needed (for burning pain during urination).   QUEtiapine 50 MG tablet Commonly known as: SEROQUEL Take 50 mg by mouth at bedtime.   senna-docusate 8.6-50 MG tablet Commonly known as: Senokot-S Take 2 tablets by mouth 2 (two) times daily.   traZODone 100 MG tablet Commonly known as: DESYREL Take 125 mg by mouth at bedtime.   traZODone 50 MG tablet Commonly known as: DESYREL Take 25 mg by mouth at bedtime as needed for sleep.   trimethoprim 100 MG tablet Commonly known as: TRIMPEX Take 1 tablet (100 mg total) by mouth daily.   Vitamin D (Ergocalciferol) 1.25 MG (50000 UNIT) Caps capsule Commonly known as: DRISDOL Take 50,000 Units by mouth once a week. On Mondays        Allergies:  Allergies  Allergen Reactions   Cholestyramine Other (See Comments)    severe    Family History: Family History  Problem Relation Age of Onset   Breast cancer Maternal Aunt    Breast cancer Cousin     Social History:  reports that she has never smoked. She has never been exposed to tobacco smoke. She has never used smokeless tobacco. She reports that she does not drink alcohol and does not use drugs.  ROS:                                         Physical Exam: There were no vitals taken for this visit.  Constitutional:  Alert and oriented, No acute distress. HEENT: [place] AT, moist mucus membranes.  Trachea midline, no masses.   Laboratory Data: Lab Results  Component Value Date   WBC 10.0 02/13/2023   HGB 13.4 02/13/2023   HCT 41.9 02/13/2023   MCV 92.9 02/13/2023   PLT 192 02/13/2023    Lab Results  Component Value Date   CREATININE 0.82 02/13/2023    No results found for: "PSA"  No results found for: "TESTOSTERONE"  Lab Results  Component Value Date   HGBA1C 5.6 04/04/2016    Urinalysis    Component Value Date/Time   COLORURINE YELLOW (A) 02/09/2023 1838   APPEARANCEUR CLEAR (A) 02/09/2023 1838   APPEARANCEUR Clear 12/24/2022 1030   LABSPEC 1.017 02/09/2023 1838   PHURINE 7.0 02/09/2023 1838   GLUCOSEU NEGATIVE 02/09/2023 1838   HGBUR NEGATIVE 02/09/2023 1838   BILIRUBINUR NEGATIVE 02/09/2023 1838   BILIRUBINUR Negative 12/24/2022 1030   KETONESUR NEGATIVE 02/09/2023 1838   PROTEINUR NEGATIVE 02/09/2023 1838   NITRITE NEGATIVE 02/09/2023 1838   LEUKOCYTESUR NEGATIVE 02/09/2023 1838    Pertinent Imaging: Urine reviewed and sent for culture  Assessment & Plan: Patient has mixed incontinence and primarily urge incontinence and bedwetting.  Role of urodynamics discussed.  She has tried multiple medications.  If culture is positive I will be switching her to a different once a day antibiotic and this was discussed today.  She tends to be quite upset about her symptoms.  She understands a bladder spasms is not usually treated with a bladder suspension and that we need to control infections before we control her incontinence  1. Mixed stress and urge urinary incontinence  - Urinalysis, Complete   No follow-ups on file.  Martina Sinner, MD  [facility] [address], [address] [place], [place] [address] [phone]

## 2023-02-20 LAB — CULTURE, URINE COMPREHENSIVE

## 2023-02-22 ENCOUNTER — Telehealth: Payer: Self-pay | Admitting: *Deleted

## 2023-02-22 MED ORDER — CIPROFLOXACIN HCL 250 MG PO TABS: 250.0000 mg | ORAL_TABLET | Freq: Two times a day (BID) | ORAL | Status: AC

## 2023-02-22 MED ORDER — NITROFURANTOIN MONOHYD MACRO 100 MG PO CAPS: 100.0000 mg | ORAL_CAPSULE | Freq: Every day | ORAL | Status: DC

## 2023-02-22 NOTE — Telephone Encounter (Signed)
-----   Message from [address] A Macdiarmid sent at 02/22/2023 10:34 AM EDT ----- Please call in ciprofloxacin 250 mg twice a day for 7 days Have her stop the daily trimethoprim When she finishes the Cipro please have her start Macrodantin 100 mg once a day 30 x 11 ----- Message ----- From: Interface, [facility] Lab Results In Sent: 02/18/2023  11:36 AM EDT To: Alfredo Martinez, MD

## 2023-02-22 NOTE — Telephone Encounter (Signed)
[facility] spoke with Dr. Alen Bleacher and they will place the order for rx.

## 2023-03-29 ENCOUNTER — Other Ambulatory Visit: Payer: Self-pay | Admitting: Orthopedic Surgery

## 2023-04-01 ENCOUNTER — Encounter: Payer: Self-pay | Admitting: Urology

## 2023-04-01 ENCOUNTER — Ambulatory Visit: Payer: Medicare (Managed Care) | Admitting: Urology

## 2023-04-01 VITALS — BP 127/70 | HR 73

## 2023-04-01 DIAGNOSIS — N3946 Mixed incontinence: Secondary | ICD-10-CM

## 2023-04-01 LAB — URINALYSIS, COMPLETE
Bilirubin, UA: NEGATIVE
Glucose, UA: NEGATIVE
Ketones, UA: NEGATIVE
Leukocytes,UA: NEGATIVE
Nitrite, UA: NEGATIVE
Protein,UA: NEGATIVE
RBC, UA: NEGATIVE
Specific Gravity, UA: 1.02 (ref 1.005–1.030)
Urobilinogen, Ur: 0.2 mg/dL (ref 0.2–1.0)
pH, UA: 6 (ref 5.0–7.5)

## 2023-04-01 LAB — MICROSCOPIC EXAMINATION

## 2023-04-01 NOTE — Progress Notes (Signed)
04/01/2023 1:48 PM   Shelly Freeman Sep 16, 1950 [id number]  Referring provider: Clovis Pu, Arturo Morton, DO [address] [place],  [place] [address]  Chief Complaint  Patient presents with   Follow-up    HPI: SN: Patient seen by my partner for recurrent urinary tract infections and right-sided hydronephrosis with no stone.  20 years ago had a percutaneous procedure left kidney.  Has had a number lithotripsies saw a nurse practitioner and patient in May 2024 had pyelonephritis with positive culture.  The right sided hydronephrosis apparently waxes and wanes.  CT scan in March 2024 showed prominence of right collecting system with urothelial enhancement and right perinephric stranding and thickening of bladder in keeping with cystitis and ascending urinary tract infection.  She was given vaginal estrogen cream in May 2024.  Had 2 positive urine cultures in 2024.  Difficult historian even with translator.  Postvoid residual 27 mL.  Has milder frequency and nocturia.  Was on oxybutynin and Myrbetriq and the dose the Myrbetriq increased in July 2024.  A renal ultrasound in July 2024 demonstrated no hydronephrosis.  Patient had an abnormality on the CT scan in May 2024 near the vaginal cuff and is being followed by Dr. Dalbert Garnet.  2 with the last 3 urine cultures have been positive.  The inflammation and thickening of the bladder was seen on CT scan in March 2024 associated with the urothelial thickening of the right collecting system and stranding.  These findings were greatly improved on February 09, 2023   Today Patient reports urge incontinence.  She leaks a modest amount with coughing sneezing bending and lifting.  I asked the translator to have her concentrate on each question carefully but she tends to have a positive functional inquiry.  The first thing she said is that she wanted a bladder lift when I came into the room.  She does have high-volume bedwetting.  She soaks 6 pads a day   She voids every  1 hour and cannot hold it for 2 hours.  She gets up 4-5 times a night.   She is on oxybutynin.  She stopped the Myrbetriq because she said it did not work   I do not think she has been treated for recent bladder infection in the last few weeks.  She does not think she is infected today.   She uses a walker for her knee.  She has had a hysterectomy.   The patient has mixed incontinence and high-volume bedwetting.  Likely the overactive bladder component is very significant.  It is important to control her urinary tract infections before we can help her incontinence.  She has had a lot of inflammation of her bladder as well as her right kidney radiographically.  She will come back for pelvic examination and cystoscopy.  I nicely told her that we need to control infections before we control incontinence and that we will likely need further testing in the future by did not discuss urodynamics specifically; and that immediately performing a bladder suspension is not her short-term treatment pathway.  Call if culture positive.  Trimethoprim 100 mg 30 x 11 sent    It turned out the patient may also been on Vesicare so I discontinued it and kept her only on oxybutynin.  Is difficult to say if any the medications gave false negatives because she has been infected so much over the last many months.  I again she had to be reminded although she has been incontinent for 2 years  we need to go through a stepwise approach     Last culture negative It appears the patient is taking the once a day antibiotic since its on the medical record although since she said she was not certain where she was not.  Still has incontinence both day and night   On pelvic examination she had mild grade 2 hypermobility the bladder neck and no stress incontinence before or after cystoscopy.  Very mild grade 1 cystocele and no rectocele   Cystoscopy: Patient underwent flexible cystoscopy.  There was a lot of white flecks in the urine.   She had some mild erythema in the floor and posterior wall the bladder in keeping with cystitis.  No carcinoma.  No pain with bladder filling.   Patient has mixed incontinence and primarily urge incontinence and bedwetting. Role of urodynamics discussed. She has tried multiple medications. If culture is positive I will be switching her to a different once a day antibiotic and this was discussed today. She tends to be quite upset about her symptoms. She understands a bladder spasms is not usually treated with a bladder suspension and that we need to control infections before we control her incontinence    Today Frequency stable.  Last culture positive During urodynamics patient did not void and was catheterized and 50 mL.  Maximum bladder capacity was 148 mL.  Bladder was hypersensitive.  She had multiple unstable detrusor contractions with urgency and she leaked a severe amount.  Pressure reached a size 45 cm of water.  She had no stress incontinence but only to generate a Valsalva pressure of 20 cm of water.  During voluntary voiding she voided 85 mL with a maximal flow 5 mL/s.  Maximum voiding pressure 36 mL of water.  Residual 6 mL.  EMG activity normal.  Bladder neck descent approximately 1 cm.  There were number and reproducibility of the overactivity was impressive.  She leaked each time.    PMH: Past Medical History:  Diagnosis Date   Anginal pain (HCC)    Arthritis    Collagen vascular disease (HCC)    COPD (chronic obstructive pulmonary disease) (HCC)    DDD (degenerative disc disease), lumbar 05/14/2016   Depression    Dizziness    Dyspnea    GERD (gastroesophageal reflux disease)    Heart valve problem    per grandaughter need surgery but they told her may not survive;   History of kidney stones    Hypertension    Hypothyroidism    Lupus    Neuromuscular disorder (HCC)    Sleep apnea    no CPAP   Thyroid disease    Vitamin D deficiency 05/14/2016    Surgical  History: Past Surgical History:  Procedure Laterality Date   APPENDECTOMY     BREAST BIOPSY Left    neg   CATARACT EXTRACTION W/PHACO Left 11/13/2021   Procedure: CATARACT EXTRACTION PHACO AND INTRAOCULAR LENS PLACEMENT (IOC) LEFT 3.63 00:35.9;  Surgeon: Nevada Crane, MD;  Location: [facility];  Service: Ophthalmology;  Laterality: Left;   CATARACT EXTRACTION W/PHACO Right 12/04/2021   Procedure: CATARACT EXTRACTION PHACO AND INTRAOCULAR LENS PLACEMENT (IOC) RIGHT;  Surgeon: Nevada Crane, MD;  Location: [facility];  Service: Ophthalmology;  Laterality: Right;  3.11 00:32.6   CESAREAN SECTION     x 2   COLONOSCOPY N/A 08/24/2021   Procedure: COLONOSCOPY;  Surgeon: Jaynie Collins, DO;  Location: [facility] END[facility];  Service: Gastroenterology;  Laterality: N/A;  SPANISH INTERPRETER   COLONOSCOPY WITH PROPOFOL N/A 07/01/2017   Procedure: COLONOSCOPY WITH PROPOFOL;  Surgeon: Scot Jun, MD;  Location: [facility] END[facility];  Service: Endoscopy;  Laterality: N/A;   ESOPHAGOGASTRODUODENOSCOPY (EGD) WITH PROPOFOL N/A 07/01/2017   Procedure: ESOPHAGOGASTRODUODENOSCOPY (EGD) WITH PROPOFOL;  Surgeon: Scot Jun, MD;  Location: [facility] END[facility];  Service: Endoscopy;  Laterality: N/A;   GASTRIC BYPASS     IR INJECT/THERA/INC NEEDLE/CATH/PLC EPI/LUMB/SAC W/IMG  02/12/2023   LIPOMA EXCISION Left 06/14/2022   Procedure: EXCISION LIPOMA, left chest wall;  Surgeon: Leafy Ro, MD;  Location: [facility];  Service: General;  Laterality: Left;   LITHOTRIPSY     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   Procedure: TOTAL KNEE ARTHROPLASTY;  Surgeon: Kennedy Bucker, MD;  Location: [facility];  Service: Orthopedics;  Laterality: Left;   TUBAL LIGATION      Home Medications:  Allergies as of 04/01/2023       Reactions   Cholestyramine Other (See Comments)   severe        Medication List        Accurate as of April 01, 2023  1:48 PM. If you have any questions, ask  your nurse or doctor.          acetaminophen 650 MG CR tablet Commonly known as: TYLENOL Take 650 mg by mouth 3 (three) times daily as needed for pain.   Advair HFA 115-21 MCG/ACT inhaler Generic drug: fluticasone-salmeterol Inhale 2 puffs into the lungs 2 (two) times daily.   albuterol 108 (90 Base) MCG/ACT inhaler Commonly known as: VENTOLIN HFA Inhale into the lungs. Inhale 2 puffs into the lungs every 4 (four) hours as needed for wheezing or shortness of breath.   alendronate 70 MG tablet Commonly known as: FOSAMAX Take 70 mg by mouth once a week. Take with a full glass of water on an empty stomach.   atorvastatin 40 MG tablet Commonly known as: LIPITOR Take 40 mg by mouth at bedtime.   benzonatate 200 MG capsule Commonly known as: TESSALON Take 200 mg by mouth 3 (three) times daily as needed for cough.   buprenorphine 7.5 MCG/HR Commonly known as: BUTRANS Place 1 patch onto the skin once a week. (Apply one patch to upper back/shoulder, outer arm, or upper chest alternating sites weekly on Wednesday at [facility]. May reuse site every 21 days.)   busPIRone 15 MG tablet Commonly known as: BUSPAR Take 15 mg by mouth 2 (two) times daily.   celecoxib 100 MG capsule Commonly known as: CELEBREX Take 1 capsule by mouth 2 (two) times daily.   cetirizine 10 MG tablet Commonly known as: ZYRTEC Take 10 mg by mouth daily.   cholecalciferol 25 MCG (1000 UNIT) tablet Commonly known as: VITAMIN D3 Take 1,000 Units by mouth daily.   cyanocobalamin 1000 MCG tablet Commonly known as: VITAMIN B12 Take 1,000 mcg by mouth daily.   Dextromethorphan-guaiFENesin 10-100 MG/5ML liquid Take 10 mLs by mouth 3 (three) times daily as needed (for cough and nasal congestion).   diclofenac Sodium 1 % Gel Commonly known as: VOLTAREN Apply 4 g topically 4 (four) times daily. For pain in the right knee. Rub into knee for 1 minute for each application.   escitalopram 20 MG tablet Commonly  known as: LEXAPRO Take 20 mg by mouth daily.   estradiol 0.1 MG/GM vaginal cream Commonly known as: ESTRACE Place 0.5 g vaginally daily. Insert 1/2 gram into vagina every night as directed for two weeks then use twice weekly  on Monday and Friday nights only   famotidine 20 MG tablet Commonly known as: PEPCID Take 20 mg by mouth 2 (two) times daily.   ferrous sulfate 325 (65 FE) MG tablet Take 325 mg by mouth every Monday, Wednesday, and Friday.   fluticasone 50 MCG/ACT nasal spray Commonly known as: FLONASE Place 1 spray into both nostrils daily.   folic acid 1 MG tablet Commonly known as: FOLVITE Take 1 mg by mouth daily.   gabapentin 600 MG tablet Commonly known as: NEURONTIN Take 600 mg by mouth 3 (three) times daily.   levothyroxine 100 MCG tablet Commonly known as: SYNTHROID Take 100 mcg by mouth daily before breakfast.   psyllium 58.6 % powder Commonly known as: METAMUCIL Take 1 packet by mouth daily.   Metamucil 0.36 g Caps Generic drug: Psyllium Take 0.4 g by mouth daily. Take 4 capsules by mouth daily   methocarbamol 500 MG tablet Commonly known as: ROBAXIN Take 1 tablet (500 mg total) by mouth every 6 (six) hours as needed for muscle spasms.   metoprolol succinate 25 MG 24 hr tablet Commonly known as: TOPROL-XL Take 1 tablet (25 mg total) by mouth daily.   nitrofurantoin (macrocrystal-monohydrate) 100 MG capsule Commonly known as: MACROBID Take 1 capsule (100 mg total) by mouth daily.   nystatin powder Apply 1 Application topically 2 (two) times daily. Apply moderate amount to groin and under breast twice daily, please keep area dry.   OPTIVE 0.5-0.9 % ophthalmic solution Generic drug: carboxymethylcellul-glycerin Place 2 drops into both eyes 4 (four) times daily as needed (for irritated eyes).   pantoprazole 20 MG tablet Commonly known as: PROTONIX Take 20 mg by mouth daily.   phenazopyridine 100 MG tablet Commonly known as: PYRIDIUM Take 100  mg by mouth 3 (three) times daily as needed (for burning pain during urination).   QUEtiapine 50 MG tablet Commonly known as: SEROQUEL Take 50 mg by mouth at bedtime.   senna-docusate 8.6-50 MG tablet Commonly known as: Senokot-S Take 2 tablets by mouth 2 (two) times daily.   traZODone 100 MG tablet Commonly known as: DESYREL Take 125 mg by mouth at bedtime.   traZODone 50 MG tablet Commonly known as: DESYREL Take 25 mg by mouth at bedtime as needed for sleep.   Vitamin D (Ergocalciferol) 1.25 MG (50000 UNIT) Caps capsule Commonly known as: DRISDOL Take 50,000 Units by mouth once a week. On Mondays        Allergies:  Allergies  Allergen Reactions   Cholestyramine Other (See Comments)    severe    Family History: Family History  Problem Relation Age of Onset   Breast cancer Maternal Aunt    Breast cancer Cousin     Social History:  reports that she has never smoked. She has never been exposed to tobacco smoke. She has never used smokeless tobacco. She reports that she does not drink alcohol and does not use drugs.  ROS:                                        Physical Exam: There were no vitals taken for this visit.  Constitutional:  Alert and oriented, No acute distress. HEENT: [place] AT, moist mucus membranes.  Trachea midline, no masses.   Laboratory Data: Lab Results  Component Value Date   WBC 10.0 02/13/2023   HGB 13.4 02/13/2023   HCT 41.9 02/13/2023  MCV 92.9 02/13/2023   PLT 192 02/13/2023    Lab Results  Component Value Date   CREATININE 0.82 02/13/2023    No results found for: "PSA"  No results found for: "TESTOSTERONE"  Lab Results  Component Value Date   HGBA1C 5.6 04/04/2016    Urinalysis    Component Value Date/Time   COLORURINE YELLOW (A) 02/09/2023 1838   APPEARANCEUR Cloudy (A) 02/18/2023 1003   LABSPEC 1.017 02/09/2023 1838   PHURINE 7.0 02/09/2023 1838   GLUCOSEU Negative 02/18/2023 1003   HGBUR  NEGATIVE 02/09/2023 1838   BILIRUBINUR Negative 02/18/2023 1003   KETONESUR NEGATIVE 02/09/2023 1838   PROTEINUR 1+ (A) 02/18/2023 1003   PROTEINUR NEGATIVE 02/09/2023 1838   NITRITE Negative 02/18/2023 1003   NITRITE NEGATIVE 02/09/2023 1838   LEUKOCYTESUR Negative 02/18/2023 1003   LEUKOCYTESUR NEGATIVE 02/09/2023 1838    Pertinent Imaging:   Assessment & Plan: The patient has a small capacity overactive bladder.  Likely 80% of bladder dysfunction is refractory overactive bladder.  I do not recommend a sling or bulking agent in this scenario based upon subjective and objective results.  She is very prone to bladder infections and has had impressive CT findings.  This may also complicate a number of treatments  Translator was excellent.  I think the patient understood things well.  Clinically now she is on Macrodantin and I believe isolates this over the phone.  Clinically infection free.  We will try to get a urine before she leaves and sent for culture.  We need to keep the urine sterile.  Come back on Tesa samples and prescription.  I briefly mention 3 refractory treatments but will discuss in detail next visit.  She understands a bladder suspension is not her treatment  1. Mixed stress and urge urinary incontinence  - Urinalysis, Complete   No follow-ups on file.  Martina Sinner, MD  [facility] [address], [address] [place], [place] [address] [phone]

## 2023-04-11 ENCOUNTER — Encounter
Admission: RE | Admit: 2023-04-11 | Discharge: 2023-04-11 | Disposition: A | Discharge status: Home / Self Care | Payer: Medicare (Managed Care) | Source: Ambulatory Visit | Attending: Orthopedic Surgery | Admitting: Orthopedic Surgery

## 2023-04-11 ENCOUNTER — Other Ambulatory Visit: Payer: Self-pay

## 2023-04-11 VITALS — BP 104/59 | HR 70 | Resp 18 | Ht 61.0 in

## 2023-04-11 DIAGNOSIS — Z01818 Encounter for other preprocedural examination: Secondary | ICD-10-CM | POA: Diagnosis present

## 2023-04-11 DIAGNOSIS — Z01812 Encounter for preprocedural laboratory examination: Secondary | ICD-10-CM

## 2023-04-11 HISTORY — DX: Unspecified asthma, uncomplicated: J45.909

## 2023-04-11 LAB — URINALYSIS, ROUTINE W REFLEX MICROSCOPIC
Bilirubin Urine: NEGATIVE
Glucose, UA: NEGATIVE mg/dL
Hgb urine dipstick: NEGATIVE
Ketones, ur: NEGATIVE mg/dL
Leukocytes,Ua: NEGATIVE
Nitrite: NEGATIVE
Protein, ur: NEGATIVE mg/dL
Specific Gravity, Urine: 1.02 (ref 1.005–1.030)
pH: 5 (ref 5.0–8.0)

## 2023-04-11 LAB — SURGICAL PCR SCREEN
MRSA, PCR: NEGATIVE
Staphylococcus aureus: NEGATIVE

## 2023-04-11 NOTE — Pre-Procedure Instructions (Signed)
Patient lives alone she is a [facility] patient, instructions faxed to [place] from [facility].

## 2023-04-11 NOTE — Patient Instructions (Addendum)
Your procedure is scheduled on: 04/25/23 - Thursday Report to the Registration Desk on the 1st floor of the [facility]. To find out your arrival time, please call [phone] between 1PM - 3PM on: 04/24/23 - Wednesday If your arrival time is 6:00 am, do not arrive before that time as the [facility] entrance doors do not open until 6:00 am.  REMEMBER: Instructions that are not followed completely may result in serious medical risk, up to and including death; or upon the discretion of your surgeon and anesthesiologist your surgery may need to be rescheduled.  Do not eat food after midnight the night before surgery.  No gum chewing or hard candies.  You may however, drink CLEAR liquids up to 2 hours before you are scheduled to arrive for your surgery. Do not drink anything within 2 hours of your scheduled arrival time.  Clear liquids include: - water  - apple juice without pulp - gatorade (not RED colors) - black coffee or tea (Do NOT add milk or creamers to the coffee or tea) Do NOT drink anything that is not on this list.  In addition, your doctor has ordered for you to drink the provided:  Ensure Pre-Surgery Clear Carbohydrate Drink  Drinking this carbohydrate drink up to two hours before surgery helps to reduce insulin resistance and improve patient outcomes. Please complete drinking 2 hours before scheduled arrival time.  One week prior to surgery: Stop starting 04/18/23, Anti-inflammatories (NSAIDS) such as diclofenac Sodium (VOLTAREN) ,Advil, Aleve, Ibuprofen, Motrin, Naproxen, Naprosyn and Aspirin based products such as Excedrin, Goody's Powder, BC Powder. You may take Tylenol if needed for pain up until the day of surgery.  Stop starting 04/18/23, ANY OVER THE COUNTER supplements until after surgery : folic acid (FOLVITE) , cholecalciferol (VITAMIN D3) ,    ON THE DAY OF SURGERY ONLY TAKE THESE MEDICATIONS WITH SIPS OF WATER:  busPIRone (BUSPAR)  celecoxib (CELEBREX  ) escitalopram (LEXAPRO)  famotidine (PEPCID)  fluticasone (FLONASE)  gabapentin (NEURONTIN)  levothyroxine (SYNTHROID)  MACROBID  pantoprazole (PROTONIX)  SYMBICORT   Use inhalers albuterol (VENTOLIN HFA) on the day of surgery and bring to the hospital.   No Alcohol for 24 hours before or after surgery.  No Smoking including e-cigarettes for 24 hours before surgery.  No chewable tobacco products for at least 6 hours before surgery.  No nicotine patches on the day of surgery.  Do not use any "recreational" drugs for at least a week (preferably 2 weeks) before your surgery.  Please be advised that the combination of cocaine and anesthesia may have negative outcomes, up to and including death. If you test positive for cocaine, your surgery will be cancelled.  On the morning of surgery brush your teeth with toothpaste and water, you may rinse your mouth with mouthwash if you wish. Do not swallow any toothpaste or mouthwash.  Use CHG Soap or wipes as directed on instruction sheet.  Do not wear jewelry, make-up, hairpins, clips or nail polish.  For welded (permanent) jewelry: bracelets, anklets, waist bands, etc.  Please have this removed prior to surgery.  If it is not removed, there is a chance that hospital personnel will need to cut it off on the day of surgery.  Do not wear lotions, powders, or perfumes.   Contact lenses, hearing aids and dentures may not be worn into surgery.  Do not bring valuables to the hospital. [facility] is not responsible for any missing/lost belongings or valuables.   Notify your  doctor if there is any change in your medical condition (cold, fever, infection).  Wear comfortable clothing (specific to your surgery type) to the hospital.  After surgery, you can help prevent lung complications by doing breathing exercises.  Take deep breaths and cough every 1-2 hours. Your doctor may order a device called an Incentive Spirometer to help you take deep  breaths. When coughing or sneezing, hold a pillow firmly against your incision with both hands. This is called "splinting." Doing this helps protect your incision. It also decreases belly discomfort.  If you are being admitted to the hospital overnight, leave your suitcase in the car. After surgery it may be brought to your room.  In case of increased patient census, it may be necessary for you, the patient, to continue your postoperative care in the Same Day Surgery department.  If you are being discharged the day of surgery, you will not be allowed to drive home. You will need a responsible individual to drive you home and stay with you for 24 hours after surgery.   If you are taking public transportation, you will need to have a responsible individual with you.  Please call the Pre-admissions Testing Dept. at [phone] if you have any questions about these instructions.  Surgery Visitation Policy:  Patients having surgery or a procedure may have two visitors.  Children under the age of 11 must have an adult with them who is not the patient.  Inpatient Visitation:    Visiting hours are 7 a.m. to 8 p.m. Up to four visitors are allowed at one time in a patient room. The visitors may rotate out with other people during the day.  One visitor age 32 or older may stay with the patient overnight and must be in the room by 8 p.m.  Su procedimiento est programado para el: 19/12/24 Peggye Ley Presntese en el mostrador de Chartered certified accountant piso del [facility] [place]. Para saber su hora de llegada, llame al [phone] entre la 1:00 p. m. y las 3:00 p. m. el: 18/12/24 - mircoles Si su hora de llegada es a las 6:00 am, no llegue antes de esa hora ya que las puertas de [place] del [facility] no se abren Teacher, adult education las 6:00 am.  RECORDAR: Las instrucciones que no se siguen completamente pueden provocar riesgos mdicos graves, que pueden llegar hasta la [place]; o, segn el criterio de su  cirujano y Scientific laboratory technician, es posible que sea Aeronautical engineer su Leisure centre manager.  No ingiera alimentos despus de la medianoche del da anterior a la ciruga.  No mascar chicle ni caramelos duros.  Sin embargo, puede beber lquidos [employer] 2 horas antes de la fecha prevista de llegada a la [place]. No beba nada dentro de las 2 horas anteriores a su hora de Nurse, learning disability.  Los lquidos claros incluyen: - agua  - jugo de manzana sin pulpa - gatorade (no colores ROJOS) - caf o t negro (NO agregue leche ni cremas al caf o t) NO beba nada que no est en esta lista.  Adems, su mdico le ha recetado que beba lo siguiente:  Asegrese de beber una bebida clara con carbohidratos antes de la Electrical engineer esta bebida con carbohidratos [employer] horas antes de la ciruga ayuda a reducir la resistencia a la insulina y [employer] de [place]. Termine de beber 2 horas antes de la hora de llegada programada.  Una semana antes de la ciruga: Deje de comenzar el  04/18/23, antiinflamatorios (AINE) como diclofenaco sdico (VOLTAREN), Advil, Aleve, ibuprofeno, Motrin, naproxeno, naprosyn y productos a base de aspirina como Excedrin, Goody's Powder, BC Powder. Puede tomar Tylenol si es necesario para Marketing executive de la [place].  Deje de comenzar el 04/18/23, CUALQUIER suplemento de venta libre hasta despus de la ciruga: cido flico (FOLVITE), colecalciferol (VITAMINA D3),    EL DA DE LA CIRUGA SLO TOMA ESTOS MEDICAMENTOS CON sorbos de agua:  1. busPIRone (BUSPAR)  2. celecoxib (CELEBREX) 3. escitalopram (LEXAPRO)  4. famotidina (PEPCID)  5. fluticasona (FLONASE)  6. gabapentina (NEURONTIN)  7. levotiroxina (SINTROIDE)  8. MACROBIDO  9. pantoprazol (PROTONIX)  10. SIMBICORTE   Utilice inhaladores de albuterol (VENTOLIN HFA) el da de la ciruga y llvelos al hospital.   No consumir alcohol durante las 24 horas anteriores o posteriores a la [place].  No  fumar, incluidos los cigarrillos electrnicos, durante las 24 horas previas a la [place].  No consumir productos de tabaco masticables durante al menos 6 horas antes de la [place].  Sin parches de Optometrist de la [place].  No use ningn medicamento "recreativo" durante al menos una semana (preferiblemente 2 semanas) antes de la ciruga.  Tenga en cuenta que la combinacin de cocana y anestesia puede [employer], que pueden llegar hasta la [place]. Si su prueba de cocana da positivo, su ciruga ser cancelada. La maana de la ciruga cepille sus dientes con pasta dental y agua, puede enjuagarse la boca con enjuague bucal si lo desea. No ingiera pasta de dientes ni enjuague bucal.  Utilice jabn o toallitas CHG como se indica en la hoja de instrucciones.  No use joyas, maquillaje, horquillas, clips ni esmalte de uas.  Para joyera soldada (permanente): pulseras, tobilleras, cinturillas, etc. Retrelos antes de la ciruga.  Si no se extrae, existe la posibilidad de que el personal del hospital deba cortrselo el da de la [place].  No use lociones, polvos ni perfumes.   No se pueden usar lentes de contacto, audfonos ni dentaduras postizas durante la [place].  No lleve objetos de valor al hospital. [facility] no es responsable de ninguna pertenencia u objeto de valor perdido o perdido.   Notifique a su mdico si hay algn cambio en su condicin mdica (resfriado, fiebre, infeccin).  Lleve ropa cmoda (especfica para su tipo de [place]) al hospital. Despus de la ciruga, usted puede ayudar a prevenir complicaciones pulmonares haciendo ejercicios de respiracin.  Respire profundamente y tosa cada 1 o 2 horas. Su mdico puede indicarle un dispositivo llamado espirmetro incentivador para ayudarle a respirar profundamente. Al toser o Engineering geologist, sostenga firmemente una almohada contra la incisin con ambas manos. Esto se llama "ferulizacin". Hacer esto ayuda a proteger  su incisin. Tambin disminuye las molestias abdominales.  Si vas a pasar la noche en el hospital, deja tu maleta en el coche. Despus de la [place], es posible que lo lleven a su habitacin.  En caso de un mayor censo de [place], puede ser necesario que usted, el [place], contine con su atencin posoperatoria en el departamento de [place] el [place].  Si le dan el alta el da de la [place], no se le permitir conducir a casa. Necesitar que una persona responsable lo lleve a su casa y se quede con usted durante 24 horas despus de la [place].   Si viaja en transporte pblico, deber ir acompaado de una persona responsable.  Llame al Departamento de pruebas previas a la admisin al [phone] si  tiene alguna pregunta sobre estas instrucciones.  Poltica de visitas a ciruga:  Los [employer] se someten a [place] o un procedimiento pueden [employer] visitantes.  Los nios menores de 16 aos deben estar acompaados por un adulto que no sea el [place].  Visitas para pacientes hospitalizados:    El horario de visita es de 7 a 20 horas. Se permiten hasta cuatro visitantes a la vez en la habitacin de un paciente. Los visitantes podrn rotar con Garment/textile technologist.  Un visitante de 16 aos o ms puede quedarse con el paciente durante la noche y debe estar en la habitacin antes de las 8 p. m.    Cmo utilizar un espirmetro de incentivo  Un espirmetro de incentivo es un dispositivo que mide qu tan bien puede llenar los pulmones con cada respiracin. Aprender a respirar hondo y profundamente con este dispositivo puede ayudarlo a Pharmacologist los pulmones limpios y [place]. El espirmetro puede ayudar a Dietitian o a Conservator, museum/gallery probabilidad de Risk manager respiratorios (pulmonares), especialmente infecciones. Se le puede pedir que utilice un espirmetro: Despus de [place]. Si tiene un problema pulmonar o antecedentes de tabaquismo. Luego de [employer] por un perodo prolongado sin poder moverse ni Agricultural engineer. Si el espirmetro incluye un indicador para Warehouse manager mayor nmero que ha [place], [place] mdico o terapeuta especializado en terapia respiratoria lo ayudar a establecer una meta. Mantenga un registro del progreso segn se lo haya indicado el mdico. Cules son los riesgos? Respirar demasiado rpido Patent examiner o que se [place]. Tmese su tiempo para no marearse o sentir vahdos. Si siente dolor, puede ser necesario que tome analgsicos antes de usar el espirmetro de incentivo. Es ms difcil respirar hondo si tiene Engineer, mining. Cmo utilizar su espirmetro de Civil engineer, contracting en el borde de la cama o sobre una silla. Sostenga el espirmetro de incentivo, de modo que est en posicin vertical. Antes de usar el espirmetro, exhale con normalidad. Coloque la boquilla en la boca. Asegrese de que sus labios estn cerrados hermticamente alrededor de la boquilla. Inhale por la boca con lentitud y tan profundamente como sea posible, para que el pistn o la bola se eleve hacia la parte superior de la cmara. Mantenga la respiracin durante 3 a 5 segundos, o durante el tiempo que sea posible. Si el espirmetro incluye un indicador, utilcelo para que gue su respiracin. Respire ms lentamente si el indicador sobrepasa las reas marcadas. Qutese la boquilla de la boca y exhale con normalidad. El pistn o la bola volvern a la parte inferior de la cmara. Descanse durante unos segundos y luego repita los pasos 10 veces o ms. Tmese su tiempo y respire con normalidad entre las respiraciones profundas para no marearse o sentir vahdos. Hgalo cada 1 o 2 horas, cuando est despierto. Si el espirmetro Manufacturing engineer de objetivo para mostrar el mayor nmero que ha alcanzado (mejor esfuerzo), use esto como un objetivo para tratar de Film/video editor cada repeticin. Despus de cada sesin de 10 respiraciones profundas, tosa un  par de veces. Esto ayudar a asegurarse de que sus pulmones estn limpios. Si tiene una incisin de [place] en el pecho o el abdomen, coloque una almohada o una toalla enrollada firmemente contra la incisin al toser. Esto puede ayudar a reducir Education officer, community respira profundamente y tose. Consejos generales Cuando pueda levantarse de la cama: [place] con frecuencia. Siga haciendo respiraciones profundas y tosiendo para [employer].  Siga usando el espirmetro de incentivo hasta que el mdico le diga que est bien dejar de usarlo. Si estuvo internado, es posible que le hayan indicado que contine con el uso del espirmetro en su casa. Comunquese con un mdico si: Tiene dificultad para Publishing copy. Tiene dificultad para Publishing copy con la frecuencia que le han indicado. Los analgsicos no Oceanographer lo suficiente como para usar el espirmetro segn lo indicado. Tiene fiebre. Solicite ayuda de inmediato si: Le falta el aire. Escupe mucosidad con sangre de los pulmones al toser. Le sale lquido o sangre de una incisin despus de toser. Resumen El espirmetro de incentivo es un dispositivo que puede ayudarlo a aprender a Industrial/product designer hondo y profundamente para Pharmacologist los pulmones limpios y [place]. Se le puede pedir que use un espirmetro despus de [place], si tiene un problema pulmonar o antecedentes de tabaquismo, o si ha [employer] un largo perodo. Utilice su espirmetro de incentivo segn las instrucciones cada 1 o 2 horas mientras est despierto. Si tiene una incisin en el pecho o el abdomen, coloque una almohada o una toalla enrollada firmemente contra la incisin al toser. Esto ayudar a Glass blower/designer. Obtenga ayuda de inmediato si tiene dificultad para Industrial/product designer, tose con mucosidad sanguinolenta o [employer] de la incisin cuando tose. Esta informacin no tiene Theme park manager el consejo del mdico. Asegrese de hacerle al  mdico cualquier pregunta que tenga. Document Revised: 08/07/2019 Document Reviewed: 08/07/2019 Elsevier Patient Education  2023 Elsevier Inc.     Instrucciones para baarse con el jabn de 5 CHG antes de la ciruga Pre-operative 5 CHG Bath Instructions  Usted puede desempear un papel clave en la reduccin del riesgo de infeccin despus de la ciruga. Su piel debe estar lo ms limpia posible de grmenes. Puede reducir el nmero de grmenes en su piel lavndose con un jabn de CHG (gluconato de clorhexidina) antes de la ciruga. El CHG es un jabn antisptico que [employer] grmenes y sigue matndolos incluso despus de que se bae.  NO LO UTILICE si usted tiene alergia a la clorhexidina/CHG o a los Theatre manager. Si la piel se le enrojece o se irrita, deje de usar el CHG e infrmelo a uno de nuestros enfermeros(as) al [phone].  Por favor, dchese con el jabn CHG comenzando 4 das antes de la ciruga segn el horario siguiente:   Por favor, tenga en cuenta lo siguiente:  NO SE AFEITE, incluyendo las piernas y [place], a Glass blower/designer del da de la primera ducha. Usted puede afeitarse la cara en cualquier momento antes o el mismo da de la [place]. Ponga sbanas limpias en su cama el da que comience a usar el jabn CHG. Use una toallita limpia (que no haya utilizado desde que se lav) para cada ducha. NO duerma con mascotas una vez que empiece a usar el CHG.  Instrucciones para la ducha con el CHG:  Si usted elige lavarse el pelo y las partes ntimas, lvese primero con el champ/jabn habitual.  Despus de usar el champ/jabn, enjuguese completamente el pelo y el cuerpo para eliminar los residuos del champ/jabn.  CIERRE el grifo y aplique unas 3 cucharadas (45 ml) del jabn CHG en una toallita LIMPIA. Aplquese el jabn CHG SOLAMENTE DESDE EL CUELLO HASTA LOS DEDOS DE LOS PIES (lavndose durante 3-5 minutos).  NO USE el jabn CHG en la cara, las zonas ntimas, en heridas  abiertas o en llagas.  Preste Neomia Dear  atencin especial al rea donde le Zenaida Niece a operar.  Si le van a [employer], puede ser conveniente que otra persona le lave la espalda. Espere 2 minutos despus de haberse aplicado el jabn CHG, luego se lo puede enjuagar.  Squese con toquecitos ligeros usando una toalla limpia. Pngase ropa/pijama limpia. Si usted elige ponerse locin o crema, por favor, SOLAMENTE use las lociones compatibles con el CHG que se enumeran al reverso de este papel.    Instrucciones adicionales para Medical laboratory scientific officer de la ciruga: NO SE APLIQUE lociones, desodorantes, colonias ni perfumes.    Pngase ropa limpia y cmoda.   Cepllese los dientes.  Pregunte a su enfermero(a) antes de aplicarse cualquier medicamento recetado a la piel.      Lociones compatibles con el CHG   Aveeno Moisturizing Lotion (locin humectante Aveeno)  Cetaphil Moisturizing Cream (crema humectante Cetaphil)  Cetaphil Moisturizing Lotion (locin humectante Cetaphil)  Clairol Herbal Essence Moisturizing Lotion, Dry Skin (locin Clairol Herbal Essence humectante para piel seca)  Clairol Herbal Essence Moisturizing Lotion, Extra Dry Skin (locin Clairol Herbal Essence humectante para piel muy reseca)  Clairol Herbal Essence Moisturizing Lotion, Normal Skin (locin Clairol Herbal Essence humectante para piel normal)  Curel Age Defying Therapeutic Moisturizing Lotion with Alpha Hydroxy (locin Curel humectante teraputica para el antienvejecimiento con alfahidroxicidos)  Curel Extreme Care Body Lotion (locin corporal Curel para cuidados extremos)  Curel Soothing Hands Moisturizing Hand Lotion (locin Curel calmante para las manos)  Curel Therapeutic Moisturizing Cream, Fragrance-Free (crema Curel humectante teraputica sin fragancias)  Curel Therapeutic Moisturizing Lotion, Fragrance-Free (locin Curel humectante teraputica sin fragancias)  Curel Therapeutic Moisturizing Lotion, Original Formula (locin  Curel humectante teraputica de frmula original)  Eucerin Daily Replenishing Lotion (locin Eucerin rejuvenecedora diaria)  Eucerin Dry Skin Therapy Plus Alpha Hydroxy Crme (terapia Eucerin contra la piel seca con crema de alfahidroxicidos)  Eucerin Dry Skin Therapy Plus Alpha Hydroxy Lotion (locin Eucerin terapia contra la piel seca con alfahidroxicidos)  Eucerin Original Crme (crema Eucerin original)  Eucerin Original Lotion (locin Eucerin original)  Eucerin Plus Crme Eucerin Plus Lotion (locin o crema Eucerin Plus)  Eucerin TriLipid Replenishing Lotion (locin Eucerin rejuvenecedora Trilipid)  Keri Anti-Bacterial Hand Lotion (locin Keri antibacteriana para las manos)  Keri Deep Conditioning Original Lotion Dry Skin Formula Softly Scented (locin Keri original de hidratacin profunda, frmula para piel seca, con fragancia suave)  Keri Deep Conditioning Original Lotion, Fragrance Free Sensitive Skin Formula (locin Keri original de hidratacin profunda, frmula para piel sensible, sin fragancia)  Keri Lotion Fast Absorbing Fragrance Free Sensitive Skin Formula (locin Keri de absorcin rpida, frmula para piel sensible, sin fragancia)  Keri Lotion Fast Absorbing Softly Scented Dry Skin Formula (locin Keri de absorcin rpida, frmula para piel seca, con fragancia suave)  Keri Original Lotion (locin Keri original)  Keri Skin Renewal Lotion Keri Silky Smooth Lotion (lociones Keri de renovacin drmica y sedosamente suave)  Keri Silky Smooth Sensitive Skin Lotion (locin Keri sedosamente suave para piel sensible)  Nivea Body Creamy Conditioning Oil (aceite Nivea cremoso hidratante para el cuerpo)  Company secretary Extra Enriched Lotion (locin Nivea extraenriquecida para el cuerpo)  Company secretary Original Lotion (locin Nivea original para el cuerpo)  Physicist, medical Moisturizing Lotion Nivea Crme (locin Physicist, medical humectante y crema Nivea)  Nivea Skin Firming Lotion (locin Nivea  reafirmante)  NutraDerm 30 Skin Lotion (locin NutraDerm 30 para la piel)  NutraDerm Skin Lotion (locin NutraDerm para la piel)  NutraDerm Therapeutic Skin Cream (crema NutraDerm teraputica  para la piel)  NutraDerm Therapeutic Skin Lotion (locin NutraDerm teraputica para la piel)  ProShield Protective Hand Cream (crema ProShield protectora para las manos)  Provon moisturizing lotion (locin humectante Provon)   INFORMACIN DE CUIDADO POLAR  [email or web address]  Cmo utilizar el sistema de terapia de fro [facility]?  Youtube [email or web address]  INSTRUCCIONES DE FUNCIONAMIENTO  iniciar el producto Con las manos secas, conecte el transformador a la conexin Agricultural engineer en la parte superior del refrigerador. A continuacin, conecte el transformador a una toma de corriente [place]. La unidad comenzar a Lawyer punto.  Para detener la bomba, desconecte la energa elctrica.  Desenchufe para detener el producto cuando no est en uso. Al desconectar la [employer] se apaga. Desenchufe siempre inmediatamente despus de su uso. Nunca lo dejes enchufado sin supervisin. Retire la almohadilla.    PRIMERO AGREGUE AGUA PARA LLENAR LA LNEA, LUEGO HIELO. Reemplace el hielo cuando el hielo existente est casi derretido.  1 Discuta el tratamiento con su profesional de atencin mdica autorizado y [employer] nicamente segn lo prescrito 2 Aplicar barrera aislante y almohadilla de terapia de fro 3 comprobar si hay humedad 4 inspeccionar la piel con regularidad  Consejos y solucin de problemas Consejos de uso 1. Utilice hielo en cubitos o en trozos para un rendimiento ptimo. 2. Se recomienda drenar la almohadilla entre usos. Para drenar la plataforma, sostenga la plataforma en posicin vertical con la manguera apuntando hacia el suelo. Presione el mbolo negro y deje que se escurra el agua. 3. Puede desconectar la  almohadilla de la unidad sin quitarla del rea afectada presionando las lengetas plateadas en el acoplamiento de la manguera y separando suavemente las [place]. La almohadilla y la unidad se sellarn solas y no tendrn fugas. Nota: Es normal que gotee algo durante la liberacin. 4. NO HAGA FUNCIONAR LA BOMBA SIN AGUA! La bomba de esta unidad est diseada para funcionar con agua. Hacer funcionar la unidad sin agua causar daos permanentes a la bomba. 5. Desenchufe la unidad antes de quitar la tapa.  GUA DE SOLUCIN DE PROBLEMAS La bomba no funciona, el agua no fluye hacia la almohadilla, la almohadilla no se enfra 1. Asegrese de que el transformador est enchufado al tomacorriente de pared. 2. Confirme que el hielo y el agua estn llenos [employer] niveles indicados. 3. Asegrese de que la almohadilla no est doblada. 4. Tire suavemente del tubo azul para asegurarse de que la unin del tubo y la almohadilla est recta. 5. Retire la almohadilla del sitio de tratamiento y llnela mientras la almohadilla est colocada; luego vuelva a aplicar. 6. Confirme que los acoplamientos de las almohadillas estn firmemente sujetos a la unidad. Escuche los dobles clics (Figura 1) para confirmar que los acoplamientos de las almohadillas estn bien conectados.  Nota sobre fugas: Es inevitable cierta condensacin en las lneas, el controlador y las almohadillas, especialmente en climas ms clidos. 1. Si utiliza una unidad de [place] de fro [facility] con una almohadilla de terapia de fro desmontable y existe una fuga (que no sea condensacin en las lneas), desconecte los acoplamientos de la almohadilla. Asegrese de que las pestaas plateadas de los acoplamientos estn presionadas antes de volver a Chartered loss adjuster la almohadilla a la manguera de la bomba; luego confirme que ambos lados del acoplamiento estn correctamente encajados. 2. Si el acoplamiento contina teniendo fugas o se detecta una fuga en la  almohadilla, deje de usarlo y llame a Atencin al cliente de Breg al [phone].  Limpieza Despus de su uso, vace y seque la unidad con un pao suave. Ocasionalmente se puede usar agua tibia y un detergente suave para limpiar la bomba y los tubos.  ADVERTENCIA: [facility] puede estar lo suficientemente fro como para provocar lesiones graves, incluida necrosis total de la piel. Siga estas instrucciones de funcionamiento y lea atentamente el prospecto del producto (consulte la bolsa en el costado de la unidad) y las instrucciones de montaje de la almohadilla de terapia de fro (provistas con cada almohadilla de terapia de fro) antes de usar.

## 2023-04-12 NOTE — Pre-Procedure Instructions (Signed)
Shelly Freeman from [facility] called this Clinical research associate for questions about some medications as they pertain to her upcoming surgery, all questions answered, she understands to call for any other issue if they should arise.

## 2023-04-23 ENCOUNTER — Encounter: Payer: Self-pay | Admitting: Orthopedic Surgery

## 2023-04-23 NOTE — Progress Notes (Signed)
Perioperative / Anesthesia Services  Pre-Admission Testing Clinical Review / Pre-Operative Anesthesia Consult  Date: 04/23/23  Patient Demographics:  Name: Shelly Freeman DOB: [birth year] MRN:   [id number]  Planned Surgical Procedure(s):    Case: [id number] Date/Time: 04/25/23 1322   Procedure: TOTAL KNEE ARTHROPLASTY (Right: Knee)   Anesthesia type: Choice   Pre-op diagnosis: Primary osteoarthritis of right knee M17.11   Location: [facility] OR ROOM 01 / [facility]   Surgeons: Reinaldo Berber, MD      NOTE: Available PAT nursing documentation and vital signs have been reviewed. Clinical nursing staff has updated patient's PMH/PSHx, current medication list, and drug allergies/intolerances to ensure comprehensive history available to assist in medical decision making as it pertains to the aforementioned surgical procedure and anticipated anesthetic course. Extensive review of available clinical information personally performed. [place] and PSHx updated with any diagnoses/procedures that  may have been inadvertently omitted during his intake with the pre-admission testing department's nursing staff.  Clinical Discussion:  Shelly Freeman is a 72 y.o. female who is submitted for pre-surgical anesthesia review and clearance prior to her undergoing the above procedure. Patient has never been a smoker in the past. Pertinent PMH includes: CAD, ischemic heart disease, ? valvular heart disease, diastolic dysfunction, angina, cardiac murmur, pulmonary hypertension, chronic cerebral microvascular disease, HTN, HLD, hypothyroidism, COPD, asthma, OSAH (no longer requires nocturnal PAP therapy), GERD (on daily H2 blocker), hiatal hernia (s/p failed Niesen fundoplication with recurrent hernia), IDA, SLE, OA, cervical DDD with radiculopathy, lumbar DDD, chronic pain syndrome, depression, intentional opioid overdose, insomnia.   Patient is followed by cardiology Debroah Baller, MD). She was  last seen in the cardiology clinic on 04/19/2023; notes reviewed. At the time of her clinic visit, patient doing well overall from a cardiovascular perspective. Patient denied any chest pain, shortness of breath, PND, orthopnea, palpitations, significant peripheral edema, weakness, fatigue, vertiginous symptoms, or presyncope/syncope. Patient with a past medical history significant for cardiovascular diagnoses. Documented physical exam was grossly benign, providing no evidence of acute exacerbation and/or decompensation of the patient's known cardiovascular conditions.  Of note, complete records regarding patient's cardiovascular history unavailable for review at time of consult.  Patient has received care in the state of [place] and have been unable to be obtained at this point.  Information gathered from patient report and from notes provided by her local cardiologist.  Patient reported to have undergone diagnostic LEFT heart catheterization prior to moving to  from [facility].  This would have been an approximately 2017 or 2018.  Patient reports that she had a stent (unknown type) placed to an unknown coronary artery.  Myocardial perfusion imaging study was performed on 05/28/2016 revealing normal left ventricular systolic function with an EF of 65%.  There was no evidence of stress-induced myocardial ischemia or arrhythmia; no scintigraphic evidence of scar. TID ratio = 1.46.  Study determined to be normal and low risk.  Most recent TTE was performed on 12/19/2022 revealing a normal left ventricular systolic function with an EF of >55%.  There were no regional wall motion abnormalities.  Right ventricular size and function normal.  There was no significant valvular regurgitation.  TR maximal velocity 2.9 m/s; estimated PASP = 37 mmHg suggestive of mild pulmonary hypertension.  Cardiac PET CT (stress test) was performed on 03/18/2023 revealing normal left ventricular systolic function with an EF of >60%.  There  were minimal coronary artery calcifications noted.  There was homogenous tracer distribution throughout the myocardium with no stress-induced perfusion  abnormalities; no scintigraphic evidence of scar.  Overall, study determined to be normal.  Blood pressure well controlled at 103/58 mmHg on currently prescribed beta-blocker (metoprolol succinate) monotherapy therapies.  Patient is on atorvastatin for her HLD diagnosis and ASCVD prevention. Patient is not diabetes. She does have an OSAH diagnosis, however she no longer requires nocturnal PAP therapy following weight loss from RNY bypass.  Functional capacity somewhat limited by patient's age and arthritides.  With that said, patient is able to complete all of her  ADL/IADLs without cardiovascular limitation.  Per the DASI, patient is able to achieve at least 4 METS of physical activity without experiencing any significant degree of angina/anginal equivalent symptoms.  No changes were made to her medication regimen during her visit with cardiology.  Patient scheduled to follow-up with outpatient cardiology in 3 months or sooner if needed.  Shelly Freeman is scheduled for an elective TOTAL KNEE ARTHROPLASTY (Right: Knee) on 04/25/2023 with Dr. Reinaldo Berber, MD.  Given patient's past medical history significant for cardiovascular diagnoses, presurgical cardiac clearance was sought by the PAT team. Per cardiology, "this patient is optimized for surgery and may proceed with the planned procedural course with a LOW risk of significant perioperative cardiovascular complications".  In review of her medication reconciliation, the patient is not noted to be taking any type of anticoagulation or antiplatelet therapies that would need to be held during her perioperative course.  Patient denies previous perioperative complications with anesthesia in the past. In review his EMR, it is noted that patient underwent a general anesthetic course here at [facility] in 06/2022 without documented complications.      04/11/2023    9:16 AM 04/01/2023    1:49 PM 02/18/2023   10:13 AM  Vitals with BMI  Height 5\' 1"   5\' 1"   Weight   216 lbs  BMI   40.83  Systolic 104 127 696  Diastolic 59 70 79  Pulse 70 73 81   Providers/Specialists:  NOTE: Primary physician provider listed below. Patient may have been seen by APP or partner within same practice.   PROVIDER ROLE / SPECIALTY LAST Wilber Bihari, MD Orthopedics (Surgeon) 03/29/2023  Clovis Pu, Arturo Morton, DO Primary Care Provider 12/19/2022  Cheryll Dessert, MD Cardiology 04/19/2023  Maia Plan, MD Pulmonary Medicine 01/21/2023  Gerrie Nordmann, MD Rheumatology 01/10/2023   Allergies:   Allergies  Allergen Reactions   Cholestyramine Other (See Comments)    severe   Current Home Medications:   No current facility-administered medications for this encounter.    acetaminophen (TYLENOL) 650 MG CR tablet   albuterol (VENTOLIN HFA) 108 (90 Base) MCG/ACT inhaler   alendronate (FOSAMAX) 70 MG tablet   alum & mag hydroxide-simeth (MINTOX MAXIMUM STRENGTH) 400-400-40 MG/5ML suspension   atorvastatin (LIPITOR) 40 MG tablet   benzonatate (TESSALON) 200 MG capsule   buprenorphine (BUTRANS) 7.5 MCG/HR   busPIRone (BUSPAR) 15 MG tablet   celecoxib (CELEBREX) 100 MG capsule   cholecalciferol (VITAMIN D3) 25 MCG (1000 UNIT) tablet   cyanocobalamin (VITAMIN B12) 1000 MCG tablet   Dextromethorphan-guaiFENesin 10-100 MG/5ML liquid   diclofenac Sodium (VOLTAREN) 1 % GEL   escitalopram (LEXAPRO) 20 MG tablet   famotidine (PEPCID) 20 MG tablet   ferrous sulfate 325 (65 FE) MG tablet   fluticasone (FLONASE) 50 MCG/ACT nasal spray   folic acid (FOLVITE) 1 MG tablet   gabapentin (NEURONTIN) 600 MG tablet   levothyroxine (SYNTHROID) 100 MCG tablet  lidocaine (LIDODERM) 5 %   nitrofurantoin, macrocrystal-monohydrate, (MACROBID) 100 MG capsule   nystatin cream  (MYCOSTATIN)   OPTIVE 0.5-0.9 % ophthalmic solution   OXYGEN   pantoprazole (PROTONIX) 20 MG tablet   PSYLLIUM PO   QUEtiapine (SEROQUEL) 50 MG tablet   SYMBICORT 80-4.5 MCG/ACT inhaler   traZODone (DESYREL) 100 MG tablet   traZODone (DESYREL) 50 MG tablet   Vitamin D, Ergocalciferol, (DRISDOL) 1.25 MG (50000 UNIT) CAPS capsule   metoprolol succinate (TOPROL-XL) 25 MG 24 hr tablet   Vibegron (GEMTESA) 75 MG TABS   History:   Past Medical History:  Diagnosis Date   Acute respiratory failure with hypoxia (HCC)    Age related osteoporosis    Angina at rest [facility])    Anginal pain (HCC)    Arthritis    Asthma    CAD (coronary artery disease)    a.) PCI/stent (vessel/type unknown) in approx 2017-2018 [facility]); b.) MV 05/28/2016: no ischemia; c.) cardiac PET CT 03/18/2023: no ischemia   Cerebral microvascular disease    Cervical disc disorder with radiculopathy    Chronic pain syndrome    a.) on buprenorphine   COPD (chronic obstructive pulmonary disease) (HCC)    DDD (degenerative disc disease), lumbar 05/14/2016   Depression    Diastolic dysfunction    a.) TTE 01/22/2018L EF >55%, no RWMAs, G1DD, norm RVSF, triv AR/PR, mild MR/TR; b.) TTE 08/06/2019: EF >55%, no RWMAs, mild LVH, norm RVSF, mild MR/TR, RVSP 30.5; c.) TTE 12/19/2022: EF >55%, no RWMAs, norm RVSF, PASP 37   Dizziness    Dyspnea    GERD (gastroesophageal reflux disease)    Gram-negative bacteremia (E.coli) 01/26/2021   Heart valve problem    per grandaughter need surgery but they told her may not survive;   Hepatomegaly    Hiatal hernia    a.) s/p Nissen fundoplication; slipped (failed) fundoplication with recurrent hiatal hernia   History of 2019 novel coronavirus disease (COVID-19)    History of bilateral cataract extraction 11/2021   History of infection due to ESBL Escherichia coli    History of kidney stones    History of Roux-en-Y gastric bypass    HLD (hyperlipidemia)    Hypertension    Hypothyroidism     IDA (iron deficiency anemia)    Insomnia    a.) taking trazodone PRN   Intentional opioid overdose (HCC)    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)    Murmur    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   Pulmonary HTN (HCC)    a.) TTE 08/06/2019: RVSP 30.5 mmHg; b.) CTA chest 09/13/2022: dilated pulmonary arterial trunk suggestive of pHTN; c.) TTE 12/19/2022: PASP 37 mmHg   SLE (systemic lupus erythematosus) (HCC)    Sleep apnea    a.) does not require/utilize nocturnal PAP therapy   Vitamin D deficiency 05/14/2016   Past Surgical History:  Procedure Laterality Date   APPENDECTOMY     BREAST BIOPSY Left    neg   CATARACT EXTRACTION W/PHACO Left 11/13/2021   Procedure: CATARACT EXTRACTION PHACO AND INTRAOCULAR LENS PLACEMENT (IOC) LEFT 3.63 00:35.9;  Surgeon: Nevada Crane, MD;  Location: [facility];  Service: Ophthalmology;  Laterality: Left;   CATARACT EXTRACTION W/PHACO Right 12/04/2021   Procedure: CATARACT EXTRACTION PHACO AND INTRAOCULAR LENS PLACEMENT (IOC) RIGHT;  Surgeon: Nevada Crane, MD;  Location: [facility];  Service: Ophthalmology;  Laterality: Right;  3.11 00:32.6   CESAREAN SECTION     x 2  COLONOSCOPY N/A 08/24/2021   Procedure: COLONOSCOPY;  Surgeon: Jaynie Collins, DO;  Location: [facility] ENDOSCOPY;  Service: Gastroenterology;  Laterality: N/A;  SPANISH INTERPRETER   COLONOSCOPY WITH PROPOFOL N/A 07/01/2017   Procedure: COLONOSCOPY WITH PROPOFOL;  Surgeon: Scot Jun, MD;  Location: [facility] END[facility]COPY;  Service: Endoscopy;  Laterality: N/A;   CORONARY ANGIOPLASTY WITH STENT PLACEMENT     Location: [place], [place]   ESOPHAGOGASTRODUODENOSCOPY (EGD) WITH PROPOFOL N/A 07/01/2017   Procedure: ESOPHAGOGASTRODUODENOSCOPY (EGD) WITH PROPOFOL;  Surgeon: Scot Jun, MD;  Location: [facility] ENDOSCOPY;  Service: Endoscopy;  Laterality: N/A;   GASTRIC BYPASS     IR INJECT/THERA/INC NEEDLE/CATH/PLC EPI/LUMB/SAC  W/IMG  02/12/2023   LIPOMA EXCISION Left 06/14/2022   Procedure: EXCISION LIPOMA, left chest wall;  Surgeon: Leafy Ro, MD;  Location: [facility] ORS;  Service: General;  Laterality: Left;   LITHOTRIPSY     NISSEN FUNDOPLICATION N/A    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   Procedure: TOTAL KNEE ARTHROPLASTY;  Surgeon: Kennedy Bucker, MD;  Location: [facility] ORS;  Service: Orthopedics;  Laterality: Left;   TUBAL LIGATION     Family History  Problem Relation Age of Onset   Breast cancer Maternal Aunt    Breast cancer Cousin    Social History   Tobacco Use   Smoking status: Never    Passive exposure: Never   Smokeless tobacco: Never  Substance Use Topics   Alcohol use: No   Pertinent Clinical Results:  LABS:  Lab Results  Component Value Date   WBC 10.0 02/13/2023   HGB 13.4 02/13/2023   HCT 41.9 02/13/2023   MCV 92.9 02/13/2023   PLT 192 02/13/2023   Lab Results  Component Value Date   NA 136 02/13/2023   K 4.7 02/13/2023   CO2 22 02/13/2023   GLUCOSE 112 (H) 02/13/2023   BUN 31 (H) 02/13/2023   CREATININE 0.82 02/13/2023   CALCIUM 9.1 02/13/2023   EGFR 58 (L) 11/09/2022   GFRNONAA >60 02/13/2023   Hospital Outpatient Visit on 04/11/2023  Component Date Value Ref Range Status   MRSA, PCR 04/11/2023 NEGATIVE  NEGATIVE Final   Staphylococcus aureus 04/11/2023 NEGATIVE  NEGATIVE Final   Comment: (NOTE) The Xpert SA Assay (FDA approved for NASAL specimens in patients 64 years of age and older), is one component of a comprehensive surveillance program. It is not intended to diagnose infection nor to guide or monitor treatment. Performed at [facility], [address] [place], [place] [address]    Color, Urine 04/11/2023 YELLOW (A)  YELLOW Final   APPearance 04/11/2023 HAZY (A)  CLEAR Final   Specific Gravity, Urine 04/11/2023 1.020  1.005 - 1.030 Final   pH 04/11/2023 5.0  5.0 - 8.0 Final   Glucose, UA 04/11/2023 NEGATIVE  NEGATIVE mg/dL Final   Hgb  urine dipstick 04/11/2023 NEGATIVE  NEGATIVE Final   Bilirubin Urine 04/11/2023 NEGATIVE  NEGATIVE Final   Ketones, ur 04/11/2023 NEGATIVE  NEGATIVE mg/dL Final   Protein, ur 33/29/5188 NEGATIVE  NEGATIVE mg/dL Final   Nitrite 41/66/0630 NEGATIVE  NEGATIVE Final   Leukocytes,Ua 04/11/2023 NEGATIVE  NEGATIVE Final   Performed at [facility], [address] [place], [place] [address]  Office Visit on 04/01/2023  Component Date Value Ref Range Status   Specific Gravity, UA 04/01/2023 1.020  1.005 - 1.030 Final   pH, UA 04/01/2023 6.0  5.0 - 7.5 Final   Color, UA 04/01/2023 Yellow  Yellow Final   Appearance Ur  04/01/2023 Clear  Clear Final   Leukocytes,UA 04/01/2023 Negative  Negative Final   Protein,UA 04/01/2023 Negative  Negative/Trace Final   Glucose, UA 04/01/2023 Negative  Negative Final   Ketones, UA 04/01/2023 Negative  Negative Final   RBC, UA 04/01/2023 Negative  Negative Final   Bilirubin, UA 04/01/2023 Negative  Negative Final   Urobilinogen, Ur 04/01/2023 0.2  0.2 - 1.0 mg/dL Final   Nitrite, UA 86/57/8469 Negative  Negative Final   Microscopic Examination 04/01/2023 See below:   Final   WBC, UA 04/01/2023 0-5  0 - 5 /hpf Final   RBC, Urine 04/01/2023 0-2  0 - 2 /hpf Final   Epithelial Cells (non renal) 04/01/2023 0-10  0 - 10 /hpf Final   Bacteria, UA 04/01/2023 Few  None seen/Few Final    ECG: Date: 02/01/2023  Time ECG obtained: 1229 PM Rate: 78 bpm Rhythm: normal sinus Axis (leads I and aVF): normal Intervals: PR 162 ms. QRS 86 ms. QTc 414 ms. ST segment and T wave changes: No evidence of acute T wave abnormalities or significant ST segment elevation or depression.  Comparison: Similar to previous tracing obtained on 12/13/2022   IMAGING / PROCEDURES: DIAGNOSTIC RADIOGRAPHS OF RIGHT KNEE 4+ VIEWS performed on 03/29/2023 Moderate/severe degenerative changes with medial and patellofemoral joint space narrowing with osteophyte formation, subchondral  cysts and sclerosis.  Kellgren-Lawrence grade 3/4.   AP, sunrise, and flexed PA of the left knee also show status post total knee arthroplasty with components in unchanged position from previous films no evidence of periprosthetic fracture or loosening.   PET/CT MYOCARDIAL PERFUSION MULTIPLE performed on 03/18/2023 Normal myocardial perfusion study  No evidence of any significant ischemia or scar  Left ventricular systolic function is normal. Post stress the ejection fraction is > 60%.  Minimal coronary calcifications are noted   MR LUMBAR SPINE WO CONTRAST performed on 02/09/2023 Severe right and moderate left neural foraminal stenosis at L5-S1 due to combination of disc bulge and facet arthrosis. Mild narrowing of the left lateral recess at L3-L4 and right lateral recess at L5-S1. Correlate for left L4 and/or right S1 radiculopathy. No spinal canal stenosis.  CT ABDOMEN PELVIS W CONTRAST performed on 02/09/2023 No acute abnormality in the abdomen/pelvis.  No renal stones or obstructive uropathy. No CT findings of pyelonephritis. Multifocal renal parenchymal scarring. Mild hepatomegaly. Postsurgical change in the stomach with small hiatal hernia.  TRANSTHORACIC ECHOCARDIOGRAM performed on 12/19/2022 The left ventricle is normal in size with upper normal wall thickness. The left ventricular systolic function is normal, LVEF is visually estimated at > 55%. There is borderline pulmonary hypertension. The right ventricle is normal in size, with normal systolic function.   CT CERVICAL SPINE WO CONTRAST performed on 12/13/2022 No acute fracture or traumatic subluxation. Progressive endplate changes at C6-7, right greater than left. Moderate right and mild left foraminal stenosis at C6-7.  CT HEAD WO CONTRAST performed on 12/13/2022 No acute intracranial abnormality or significant interval change. Stable atrophy and moderate white matter disease. This likely reflects the sequela of  chronic microvascular ischemia.   CT ANGIO CHEST/ABD/PEL FOR DISSECTION W AND/OR WO CONTRAST performed on 09/13/2022 Normal thoracic and abdominal aorta without aneurysm or dissection. Stable enlargement of the pulmonary arterial trunk suggesting pulmonary hypertension. Patchy ground-glass opacity in both lungs could suggest small airways disease such as asthma constrictive bronchiolitis. Asymmetric edema, atypical/viral pneumonia or hypersensitivity pneumonitis. Stable slipped Nissen fundoplication with a moderate-sized recurrent hiatal hernia. 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 Right-sided hydroureteronephrosis without  obvious cause. Possible recently passed calculus. No obvious changes of pyelonephritis. Asymmetric right-sided bladder wall thickening could suggest cystitis. Recommend correlation with urinalysis. Emphysema  MYOCARDIAL PERFUSION IMAGING STUDY (LEXISCAN) performed on 05/28/2016 Normal left ventricular systolic function with a normal LVEF of 65% Normal myocardial thickening and wall motion Left ventricular cavity size normal SPECT images demonstrate homogenous tracer distribution throughout the myocardium No evidence of stress-induced myocardial ischemia or arrhythmia Normal low risk study  Impression and Plan:  Shelly Freeman has been referred for pre-anesthesia review and clearance prior to her undergoing the planned anesthetic and procedural courses. Available labs, pertinent testing, and imaging results were personally reviewed by me in preparation for upcoming operative/procedural course. [facility] medical record has been updated following extensive record review and patient interview with PAT staff.   This patient has been appropriately cleared by cardiology with an overall LOW risk of experiencing significant perioperative cardiovascular complications. Based on clinical review performed today (04/23/23), barring any significant acute changes in the patient's  overall condition, it is anticipated that she will be able to proceed with the planned surgical intervention. Any acute changes in clinical condition may necessitate her procedure being postponed and/or cancelled. Patient will meet with anesthesia team (MD and/or CRNA) on the day of her procedure for preoperative evaluation/assessment. Questions regarding anesthetic course will be fielded at that time.   Pre-surgical instructions were reviewed with the patient during his PAT appointment, and questions were fielded to satisfaction by PAT clinical staff. She has been instructed on which medications that she will need to hold prior to surgery, as well as the ones that have been deemed safe/appropriate to take on the day of his procedure. As part of the general education provided by PAT, patient made aware both verbally and in writing, that she would need to abstain from the use of any illegal substances during his perioperative course. She was advised that failure to follow the provided instructions could necessitate case cancellation or result in serious perioperative complications up to and including death. Patient encouraged to contact PAT and/or her surgeon's office to discuss any questions or concerns that may arise prior to surgery; verbalized understanding.   Quentin Mulling, MSN, APRN, FNP-C, CEN [facility]  Perioperative Services Nurse Practitioner Phone: [phone] Fax: [phone] 04/23/23 4:49 PM  NOTE: This note has been prepared using Dragon dictation software. Despite my best ability to proofread, there is always the potential that unintentional transcriptional errors may still occur from this process.

## 2023-04-24 MED ORDER — CHLORHEXIDINE GLUCONATE 0.12 % MT SOLN
15.0000 mL | Freq: Once | OROMUCOSAL | Status: AC
  Administered 2023-04-25: 15 mL via OROMUCOSAL

## 2023-04-24 MED ORDER — CEFAZOLIN SODIUM-DEXTROSE 2-4 GM/100ML-% IV SOLN
2.0000 g | INTRAVENOUS | Status: AC
  Administered 2023-04-25: 2 g via INTRAVENOUS

## 2023-04-24 MED ORDER — DEXAMETHASONE SODIUM PHOSPHATE 10 MG/ML IJ SOLN
8.0000 mg | Freq: Once | INTRAMUSCULAR | Status: DC
Start: 2023-04-24 — End: 2023-04-25

## 2023-04-24 MED ORDER — TRANEXAMIC ACID-NACL 1000-0.7 MG/100ML-% IV SOLN
1000.0000 mg | INTRAVENOUS | Status: AC
  Administered 2023-04-25 (×2): 1000 mg via INTRAVENOUS

## 2023-04-24 MED ORDER — LACTATED RINGERS IV SOLN: INTRAVENOUS | Status: DC

## 2023-04-24 MED ORDER — ORAL CARE MOUTH RINSE: 15.0000 mL | Freq: Once | OROMUCOSAL | Status: AC

## 2023-04-25 ENCOUNTER — Encounter: Payer: Self-pay | Admitting: Orthopedic Surgery

## 2023-04-25 ENCOUNTER — Observation Stay
Admission: RE | Admit: 2023-04-25 | Discharge: 2023-04-26 | Disposition: A | Discharge status: Skilled Nursing Facility | Payer: Medicare (Managed Care) | Source: Ambulatory Visit | Attending: Orthopedic Surgery | Admitting: Orthopedic Surgery

## 2023-04-25 ENCOUNTER — Other Ambulatory Visit: Payer: Self-pay

## 2023-04-25 ENCOUNTER — Ambulatory Visit: Payer: Medicare (Managed Care) | Admitting: Urgent Care

## 2023-04-25 ENCOUNTER — Encounter
Admission: RE | Disposition: A | Discharge status: Skilled Nursing Facility | Payer: Self-pay | Source: Ambulatory Visit | Attending: Orthopedic Surgery

## 2023-04-25 ENCOUNTER — Ambulatory Visit: Payer: Medicare (Managed Care)

## 2023-04-25 DIAGNOSIS — I1310 Hypertensive heart and chronic kidney disease without heart failure, with stage 1 through stage 4 chronic kidney disease, or unspecified chronic kidney disease: Secondary | ICD-10-CM | POA: Insufficient documentation

## 2023-04-25 DIAGNOSIS — Z79899 Other long term (current) drug therapy: Secondary | ICD-10-CM | POA: Diagnosis not present

## 2023-04-25 DIAGNOSIS — E039 Hypothyroidism, unspecified: Secondary | ICD-10-CM | POA: Insufficient documentation

## 2023-04-25 DIAGNOSIS — M1711 Unilateral primary osteoarthritis, right knee: Secondary | ICD-10-CM | POA: Diagnosis present

## 2023-04-25 DIAGNOSIS — I2510 Atherosclerotic heart disease of native coronary artery without angina pectoris: Secondary | ICD-10-CM | POA: Diagnosis not present

## 2023-04-25 DIAGNOSIS — Z8616 Personal history of COVID-19: Secondary | ICD-10-CM | POA: Diagnosis not present

## 2023-04-25 DIAGNOSIS — Z96652 Presence of left artificial knee joint: Secondary | ICD-10-CM | POA: Insufficient documentation

## 2023-04-25 DIAGNOSIS — F0390 Unspecified dementia without behavioral disturbance: Secondary | ICD-10-CM | POA: Insufficient documentation

## 2023-04-25 DIAGNOSIS — N189 Chronic kidney disease, unspecified: Secondary | ICD-10-CM | POA: Diagnosis not present

## 2023-04-25 DIAGNOSIS — J449 Chronic obstructive pulmonary disease, unspecified: Secondary | ICD-10-CM | POA: Insufficient documentation

## 2023-04-25 DIAGNOSIS — Z96651 Presence of right artificial knee joint: Principal | ICD-10-CM

## 2023-04-25 HISTORY — DX: Cardiac murmur, unspecified: R01.1

## 2023-04-25 HISTORY — DX: Diaphragmatic hernia without obstruction or gangrene: K44.9

## 2023-04-25 HISTORY — DX: Systemic lupus erythematosus, unspecified: M32.9

## 2023-04-25 HISTORY — DX: Chronic ischemic heart disease, unspecified: I25.9

## 2023-04-25 HISTORY — DX: Insomnia, unspecified: G47.00

## 2023-04-25 HISTORY — DX: Personal history of COVID-19: Z86.16

## 2023-04-25 HISTORY — DX: Hepatomegaly, not elsewhere classified: R16.0

## 2023-04-25 HISTORY — DX: Other cerebrovascular disease: I67.89

## 2023-04-25 HISTORY — DX: Acute respiratory failure with hypoxia: J96.01

## 2023-04-25 HISTORY — DX: Bariatric surgery status: Z98.84

## 2023-04-25 HISTORY — DX: Other ill-defined heart diseases: I51.89

## 2023-04-25 HISTORY — PX: TOTAL KNEE ARTHROPLASTY: SHX125

## 2023-04-25 HISTORY — DX: Chronic pain syndrome: G89.4

## 2023-04-25 HISTORY — DX: Poisoning by other opioids, accidental (unintentional), initial encounter: T40.2X1A

## 2023-04-25 HISTORY — DX: Age-related osteoporosis without current pathological fracture: M81.0

## 2023-04-25 HISTORY — DX: Personal history of other infectious and parasitic diseases: Z86.19

## 2023-04-25 HISTORY — DX: Hyperlipidemia, unspecified: E78.5

## 2023-04-25 HISTORY — DX: Reserved for concepts with insufficient information to code with codable children: IMO0002

## 2023-04-25 HISTORY — DX: Atherosclerotic heart disease of native coronary artery without angina pectoris: I25.10

## 2023-04-25 HISTORY — DX: Cervical disc disorder with radiculopathy, unspecified cervical region: M50.10

## 2023-04-25 HISTORY — DX: Iron deficiency anemia, unspecified: D50.9

## 2023-04-25 HISTORY — DX: Other forms of angina pectoris: I20.89

## 2023-04-25 HISTORY — DX: Acute coronary thrombosis not resulting in myocardial infarction: I24.0

## 2023-04-25 HISTORY — DX: Pulmonary hypertension, unspecified: I27.20

## 2023-04-25 SURGERY — ARTHROPLASTY, KNEE, TOTAL
Anesthesia: General | Site: Knee | Laterality: Right

## 2023-04-25 MED ORDER — ROCURONIUM BROMIDE 100 MG/10ML IV SOLN
INTRAVENOUS | Status: DC | PRN
  Administered 2023-04-25: 50 mg via INTRAVENOUS

## 2023-04-25 MED ORDER — METOCLOPRAMIDE HCL 5 MG PO TABS: 5.0000 mg | ORAL_TABLET | Freq: Three times a day (TID) | ORAL | Status: DC | PRN

## 2023-04-25 MED ORDER — ONDANSETRON HCL 4 MG/2ML IJ SOLN: 4.0000 mg | Freq: Four times a day (QID) | INTRAMUSCULAR | Status: DC | PRN

## 2023-04-25 MED ORDER — GABAPENTIN 300 MG PO CAPS
ORAL_CAPSULE | ORAL | Status: AC
  Filled 2023-04-25: qty 2

## 2023-04-25 MED ORDER — DOCUSATE SODIUM 100 MG PO CAPS
ORAL_CAPSULE | ORAL | Status: AC
  Filled 2023-04-25: qty 1

## 2023-04-25 MED ORDER — METOCLOPRAMIDE HCL 5 MG/ML IJ SOLN: 5.0000 mg | Freq: Three times a day (TID) | INTRAMUSCULAR | Status: DC | PRN

## 2023-04-25 MED ORDER — MORPHINE SULFATE (PF) 4 MG/ML IV SOLN
INTRAVENOUS | Status: AC
  Filled 2023-04-25: qty 1

## 2023-04-25 MED ORDER — MIRABEGRON ER 25 MG PO TB24
25.0000 mg | ORAL_TABLET | Freq: Every day | ORAL | Status: DC
Start: 2023-04-25 — End: 2023-04-26
  Administered 2023-04-25 – 2023-04-26 (×2): 25 mg via ORAL
  Filled 2023-04-25 (×2): qty 1

## 2023-04-25 MED ORDER — QUETIAPINE FUMARATE 25 MG PO TABS
50.0000 mg | ORAL_TABLET | Freq: Every day | ORAL | Status: DC
  Administered 2023-04-25: 50 mg via ORAL
  Filled 2023-04-25: qty 2

## 2023-04-25 MED ORDER — TRAMADOL HCL 50 MG PO TABS
50.0000 mg | ORAL_TABLET | Freq: Four times a day (QID) | ORAL | Status: DC | PRN
  Administered 2023-04-26: 50 mg via ORAL

## 2023-04-25 MED ORDER — KETOROLAC TROMETHAMINE 15 MG/ML IJ SOLN
7.5000 mg | Freq: Four times a day (QID) | INTRAMUSCULAR | Status: AC
  Administered 2023-04-25 – 2023-04-26 (×4): 7.5 mg via INTRAVENOUS

## 2023-04-25 MED ORDER — DOCUSATE SODIUM 100 MG PO CAPS
100.0000 mg | ORAL_CAPSULE | Freq: Two times a day (BID) | ORAL | Status: DC
Start: 2023-04-25 — End: 2023-04-26
  Administered 2023-04-25 – 2023-04-26 (×2): 100 mg via ORAL

## 2023-04-25 MED ORDER — MENTHOL 3 MG MT LOZG: 1.0000 | LOZENGE | OROMUCOSAL | Status: DC | PRN

## 2023-04-25 MED ORDER — HYDROCODONE-ACETAMINOPHEN 5-325 MG PO TABS
1.0000 | ORAL_TABLET | ORAL | Status: DC | PRN
  Administered 2023-04-25 – 2023-04-26 (×3): 2 via ORAL

## 2023-04-25 MED ORDER — SODIUM CHLORIDE 0.9 % IR SOLN
Status: DC | PRN
  Administered 2023-04-25: 3000 mL

## 2023-04-25 MED ORDER — HYDROMORPHONE HCL 1 MG/ML IJ SOLN
INTRAMUSCULAR | Status: AC
  Filled 2023-04-25: qty 1

## 2023-04-25 MED ORDER — LABETALOL HCL 5 MG/ML IV SOLN
INTRAVENOUS | Status: DC | PRN
  Administered 2023-04-25 (×2): 5 mg via INTRAVENOUS

## 2023-04-25 MED ORDER — PROPOFOL 10 MG/ML IV BOLUS
INTRAVENOUS | Status: DC | PRN
  Administered 2023-04-25: 150 mg via INTRAVENOUS

## 2023-04-25 MED ORDER — ONDANSETRON HCL 4 MG PO TABS: 4.0000 mg | ORAL_TABLET | Freq: Four times a day (QID) | ORAL | Status: DC | PRN

## 2023-04-25 MED ORDER — SODIUM CHLORIDE 0.9 % IV SOLN: INTRAVENOUS | Status: DC

## 2023-04-25 MED ORDER — TRAZODONE HCL 50 MG PO TABS
25.0000 mg | ORAL_TABLET | Freq: Every day | ORAL | Status: DC
  Administered 2023-04-25: 25 mg via ORAL
  Filled 2023-04-25: qty 0.5

## 2023-04-25 MED ORDER — FENTANYL CITRATE (PF) 100 MCG/2ML IJ SOLN
25.0000 ug | INTRAMUSCULAR | Status: DC | PRN
  Administered 2023-04-25 (×2): 50 ug via INTRAVENOUS

## 2023-04-25 MED ORDER — HYDROCODONE-ACETAMINOPHEN 5-325 MG PO TABS
ORAL_TABLET | ORAL | Status: AC
  Filled 2023-04-25: qty 1

## 2023-04-25 MED ORDER — ESCITALOPRAM OXALATE 20 MG PO TABS
20.0000 mg | ORAL_TABLET | Freq: Every day | ORAL | Status: DC
  Administered 2023-04-25 – 2023-04-26 (×2): 20 mg via ORAL
  Filled 2023-04-25 (×2): qty 1

## 2023-04-25 MED ORDER — LACTATED RINGERS IV SOLN: INTRAVENOUS | Status: DC | PRN

## 2023-04-25 MED ORDER — MORPHINE SULFATE (PF) 4 MG/ML IV SOLN
0.5000 mg | INTRAVENOUS | Status: DC | PRN
  Administered 2023-04-25 – 2023-04-26 (×3): 1 mg via INTRAVENOUS

## 2023-04-25 MED ORDER — SURGIPHOR WOUND IRRIGATION SYSTEM - OPTIME: TOPICAL | Status: DC | PRN

## 2023-04-25 MED ORDER — ORAL CARE MOUTH RINSE: 15.0000 mL | OROMUCOSAL | Status: DC | PRN

## 2023-04-25 MED ORDER — HYDROMORPHONE HCL 1 MG/ML IJ SOLN
INTRAMUSCULAR | Status: DC | PRN
  Administered 2023-04-25 (×2): .5 mg via INTRAVENOUS

## 2023-04-25 MED ORDER — GABAPENTIN 300 MG PO CAPS
ORAL_CAPSULE | ORAL | Status: AC
Start: 2023-04-25 — End: ?
  Filled 2023-04-25: qty 2

## 2023-04-25 MED ORDER — TRANEXAMIC ACID-NACL 1000-0.7 MG/100ML-% IV SOLN
INTRAVENOUS | Status: AC
  Filled 2023-04-25: qty 100

## 2023-04-25 MED ORDER — ACETAMINOPHEN 500 MG PO TABS
1000.0000 mg | ORAL_TABLET | Freq: Three times a day (TID) | ORAL | Status: DC
  Administered 2023-04-26 (×2): 1000 mg via ORAL

## 2023-04-25 MED ORDER — TRAZODONE HCL 100 MG PO TABS
100.0000 mg | ORAL_TABLET | Freq: Every day | ORAL | Status: DC
  Administered 2023-04-25: 100 mg via ORAL
  Filled 2023-04-25: qty 1

## 2023-04-25 MED ORDER — ATORVASTATIN CALCIUM 20 MG PO TABS
40.0000 mg | ORAL_TABLET | Freq: Every day | ORAL | Status: DC
  Administered 2023-04-25: 40 mg via ORAL

## 2023-04-25 MED ORDER — PHENOL 1.4 % MT LIQD: 1.0000 | OROMUCOSAL | Status: DC | PRN

## 2023-04-25 MED ORDER — LEVOTHYROXINE SODIUM 100 MCG PO TABS
100.0000 ug | ORAL_TABLET | Freq: Every day | ORAL | Status: DC
Start: 2023-04-26 — End: 2023-04-26
  Administered 2023-04-26: 100 ug via ORAL
  Filled 2023-04-25: qty 1

## 2023-04-25 MED ORDER — ENOXAPARIN SODIUM 30 MG/0.3ML IJ SOSY
30.0000 mg | PREFILLED_SYRINGE | Freq: Two times a day (BID) | INTRAMUSCULAR | Status: DC
  Administered 2023-04-26: 30 mg via SUBCUTANEOUS

## 2023-04-25 MED ORDER — ALBUTEROL SULFATE (2.5 MG/3ML) 0.083% IN NEBU: 2.5000 mg | INHALATION_SOLUTION | RESPIRATORY_TRACT | Status: DC | PRN

## 2023-04-25 MED ORDER — DEXAMETHASONE SODIUM PHOSPHATE 10 MG/ML IJ SOLN
INTRAMUSCULAR | Status: AC
  Filled 2023-04-25: qty 1

## 2023-04-25 MED ORDER — KETOROLAC TROMETHAMINE 15 MG/ML IJ SOLN
INTRAMUSCULAR | Status: AC
  Filled 2023-04-25: qty 1

## 2023-04-25 MED ORDER — ATORVASTATIN CALCIUM 20 MG PO TABS
ORAL_TABLET | ORAL | Status: AC
  Filled 2023-04-25: qty 2

## 2023-04-25 MED ORDER — LIDOCAINE HCL (CARDIAC) PF 100 MG/5ML IV SOSY
PREFILLED_SYRINGE | INTRAVENOUS | Status: DC | PRN
  Administered 2023-04-25: 60 mg via INTRAVENOUS

## 2023-04-25 MED ORDER — OXYCODONE HCL 5 MG PO TABS
5.0000 mg | ORAL_TABLET | Freq: Once | ORAL | Status: AC | PRN
  Administered 2023-04-25: 5 mg via ORAL

## 2023-04-25 MED ORDER — ONDANSETRON HCL 4 MG/2ML IJ SOLN
INTRAMUSCULAR | Status: AC
  Filled 2023-04-25: qty 2

## 2023-04-25 MED ORDER — ACETAMINOPHEN 325 MG PO TABS: 325.0000 mg | ORAL_TABLET | Freq: Four times a day (QID) | ORAL | Status: DC | PRN

## 2023-04-25 MED ORDER — ONDANSETRON HCL 4 MG/2ML IJ SOLN
INTRAMUSCULAR | Status: DC | PRN
  Administered 2023-04-25: 4 mg via INTRAVENOUS

## 2023-04-25 MED ORDER — SUGAMMADEX SODIUM 200 MG/2ML IV SOLN
INTRAVENOUS | Status: DC | PRN
  Administered 2023-04-25: 200 mg via INTRAVENOUS

## 2023-04-25 MED ORDER — METOPROLOL SUCCINATE ER 25 MG PO TB24
25.0000 mg | ORAL_TABLET | Freq: Every day | ORAL | Status: DC
  Administered 2023-04-25 – 2023-04-26 (×2): 25 mg via ORAL
  Filled 2023-04-25 (×2): qty 1

## 2023-04-25 MED ORDER — CEFAZOLIN SODIUM-DEXTROSE 2-4 GM/100ML-% IV SOLN
INTRAVENOUS | Status: AC
  Filled 2023-04-25: qty 100

## 2023-04-25 MED ORDER — CHLORHEXIDINE GLUCONATE 0.12 % MT SOLN
OROMUCOSAL | Status: AC
  Filled 2023-04-25: qty 15

## 2023-04-25 MED ORDER — FENTANYL CITRATE (PF) 100 MCG/2ML IJ SOLN
INTRAMUSCULAR | Status: AC
  Filled 2023-04-25: qty 2

## 2023-04-25 MED ORDER — BUSPIRONE HCL 15 MG PO TABS
15.0000 mg | ORAL_TABLET | Freq: Two times a day (BID) | ORAL | Status: DC
  Administered 2023-04-25 – 2023-04-26 (×2): 15 mg via ORAL
  Filled 2023-04-25 (×2): qty 1

## 2023-04-25 MED ORDER — CEFAZOLIN SODIUM-DEXTROSE 2-4 GM/100ML-% IV SOLN
2.0000 g | Freq: Four times a day (QID) | INTRAVENOUS | Status: AC
  Administered 2023-04-25 – 2023-04-26 (×2): 2 g via INTRAVENOUS

## 2023-04-25 MED ORDER — OXYCODONE HCL 5 MG/5ML PO SOLN: 5.0000 mg | Freq: Once | ORAL | Status: AC | PRN

## 2023-04-25 MED ORDER — FENTANYL CITRATE (PF) 100 MCG/2ML IJ SOLN
INTRAMUSCULAR | Status: DC | PRN
  Administered 2023-04-25 (×2): 50 ug via INTRAVENOUS

## 2023-04-25 MED ORDER — PANTOPRAZOLE SODIUM 40 MG PO TBEC
DELAYED_RELEASE_TABLET | ORAL | Status: AC
Start: 2023-04-25 — End: ?
  Filled 2023-04-25: qty 1

## 2023-04-25 MED ORDER — KETOROLAC TROMETHAMINE 30 MG/ML IJ SOLN
INTRAMUSCULAR | Status: AC
  Filled 2023-04-25: qty 1

## 2023-04-25 MED ORDER — HYDROMORPHONE HCL 1 MG/ML IJ SOLN
0.5000 mg | INTRAMUSCULAR | Status: AC | PRN
  Administered 2023-04-25 (×4): 0.5 mg via INTRAVENOUS

## 2023-04-25 MED ORDER — GABAPENTIN 300 MG PO CAPS
600.0000 mg | ORAL_CAPSULE | Freq: Three times a day (TID) | ORAL | Status: DC
  Administered 2023-04-25 – 2023-04-26 (×3): 600 mg via ORAL

## 2023-04-25 MED ORDER — PROPOFOL 10 MG/ML IV BOLUS
INTRAVENOUS | Status: AC
  Filled 2023-04-25: qty 20

## 2023-04-25 MED ORDER — PANTOPRAZOLE SODIUM 40 MG PO TBEC
40.0000 mg | DELAYED_RELEASE_TABLET | Freq: Every day | ORAL | Status: DC
  Administered 2023-04-25 – 2023-04-26 (×2): 40 mg via ORAL

## 2023-04-25 MED ORDER — SODIUM CHLORIDE (PF) 0.9 % IJ SOLN
INTRAMUSCULAR | Status: DC | PRN
  Administered 2023-04-25: 71 mL via INTRAMUSCULAR

## 2023-04-25 MED ORDER — OXYCODONE HCL 5 MG PO TABS
ORAL_TABLET | ORAL | Status: AC
  Filled 2023-04-25: qty 1

## 2023-04-25 MED ORDER — DEXAMETHASONE SODIUM PHOSPHATE 10 MG/ML IJ SOLN
INTRAMUSCULAR | Status: DC | PRN
  Administered 2023-04-25: 8 mg via INTRAVENOUS

## 2023-04-25 SURGICAL SUPPLY — 68 items
BLADE PATELLA REAM PILOT HOLE (MISCELLANEOUS) IMPLANT
BLADE SAGITTAL AGGR TOOTH XLG (BLADE) IMPLANT
BLADE SAW SAG 25X90X1.19 (BLADE) ×1 IMPLANT
BLADE SAW SAG 29X58X.64 (BLADE) ×1 IMPLANT
BNDG ELASTIC 6INX 5YD STR LF (GAUZE/BANDAGES/DRESSINGS) ×1 IMPLANT
BOWL CEMENT MIX W/ADAPTER (MISCELLANEOUS) ×1 IMPLANT
CEMENT BONE R 1X40 (Cement) ×2 IMPLANT
CHLORAPREP W/TINT 26 (MISCELLANEOUS) ×2 IMPLANT
COMP FEM CMT CR PERS SZ8 RT (Joint) ×1 IMPLANT
COMPONENT FEM CMT CR PRS SZ8RT (Joint) IMPLANT
COOLER POLAR GLACIER W/PUMP (MISCELLANEOUS) ×1 IMPLANT
CUFF TRNQT CYL 24X4X16.5-23 (TOURNIQUET CUFF) IMPLANT
CUFF TRNQT CYL 30X4X21-28X (TOURNIQUET CUFF) IMPLANT
DERMABOND ADVANCED .7 DNX12 (GAUZE/BANDAGES/DRESSINGS) ×1 IMPLANT
DRAPE INCISE IOBAN 66X60 STRL (DRAPES) IMPLANT
DRAPE SHEET LG 3/4 BI-LAMINATE (DRAPES) ×1 IMPLANT
DRSG MEPILEX SACRM 8.7X9.8 (GAUZE/BANDAGES/DRESSINGS) ×1 IMPLANT
DRSG OPSITE POSTOP 4X10 (GAUZE/BANDAGES/DRESSINGS) IMPLANT
DRSG OPSITE POSTOP 4X8 (GAUZE/BANDAGES/DRESSINGS) IMPLANT
ELECT REM PT RETURN 9FT ADLT (ELECTROSURGICAL) ×1
ELECTRODE REM PT RTRN 9FT ADLT (ELECTROSURGICAL) ×1 IMPLANT
GLOVE BIO SURGEON STRL SZ8 (GLOVE) ×1 IMPLANT
GLOVE BIOGEL PI IND STRL 8 (GLOVE) ×1 IMPLANT
GLOVE PI ORTHO PRO STRL 7.5 (GLOVE) ×2 IMPLANT
GLOVE PI ORTHO PRO STRL SZ8 (GLOVE) ×2 IMPLANT
GLOVE SURG SYN 7.5 E (GLOVE) ×1 IMPLANT
GLOVE SURG SYN 7.5 PF PI (GLOVE) ×1 IMPLANT
GOWN SRG XL LVL 3 NONREINFORCE (GOWNS) ×1 IMPLANT
GOWN STRL REUS W/ TWL LRG LVL3 (GOWN DISPOSABLE) ×1 IMPLANT
GOWN STRL REUS W/ TWL XL LVL3 (GOWN DISPOSABLE) ×1 IMPLANT
HOOD PEEL AWAY T7 (MISCELLANEOUS) ×2 IMPLANT
INSERT TIB ASF PS CD/8-9 11 RT (Insert) IMPLANT
IV NS IRRIG 3000ML ARTHROMATIC (IV SOLUTION) ×1 IMPLANT
KIT TURNOVER KIT A (KITS) ×1 IMPLANT
MANIFOLD NEPTUNE II (INSTRUMENTS) ×1 IMPLANT
MARKER SKIN DUAL TIP RULER LAB (MISCELLANEOUS) ×1 IMPLANT
MAT ABSORB FLUID 56X50 GRAY (MISCELLANEOUS) ×1 IMPLANT
NDL HYPO 21X1.5 SAFETY (NEEDLE) ×1 IMPLANT
NEEDLE HYPO 21X1.5 SAFETY (NEEDLE) ×1 IMPLANT
PACK TOTAL KNEE (MISCELLANEOUS) ×1 IMPLANT
PAD ARMBOARD 7.5X6 YLW CONV (MISCELLANEOUS) ×3 IMPLANT
PAD WRAPON POLAR KNEE (MISCELLANEOUS) ×1 IMPLANT
PENCIL SMOKE EVACUATOR (MISCELLANEOUS) ×1 IMPLANT
PIN DRILL HDLS TROCAR 75 4PK (PIN) IMPLANT
PULSAVAC PLUS IRRIG FAN TIP (DISPOSABLE) ×1
SCREW FEMALE HEX FIX 25X2.5 (ORTHOPEDIC DISPOSABLE SUPPLIES) IMPLANT
SCREW HEX HEADED 3.5X27 DISP (ORTHOPEDIC DISPOSABLE SUPPLIES) IMPLANT
SLEEVE SCD COMPRESS KNEE MED (STOCKING) ×1 IMPLANT
SOLUTION IRRIG SURGIPHOR (IV SOLUTION) ×1 IMPLANT
STEM POLY PAT PLY 32M KNEE (Knees) IMPLANT
STEM TIB ST PERS 14+30 (Stem) IMPLANT
STEM TIBIA 5 DEG SZ C R KNEE (Knees) IMPLANT
SUT STRATA 1 CT-1 DLB (SUTURE) ×1
SUT STRATAFIX 14 PDO 48 VLT (SUTURE) ×1 IMPLANT
SUT STRATAFIX PDO 1 14 VIOLET (SUTURE) ×1
SUT VIC AB 0 CT1 36 (SUTURE) ×1 IMPLANT
SUT VIC AB 2-0 CT2 27 (SUTURE) ×2 IMPLANT
SUT VICRYL 1-0 27IN ABS (SUTURE) ×1
SUTURE STRATA SPIR 4-0 18 (SUTURE) ×1 IMPLANT
SUTURE VICRYL 1-0 27IN ABS (SUTURE) ×1 IMPLANT
SYR 30ML LL (SYRINGE) ×2 IMPLANT
TAPE CLOTH 3X10 WHT NS LF (GAUZE/BANDAGES/DRESSINGS) ×1 IMPLANT
TIBIA STEM 5 DEG SZ C R KNEE (Knees) ×1 IMPLANT
TIP FAN IRRIG PULSAVAC PLUS (DISPOSABLE) ×1 IMPLANT
TOWEL OR 17X26 4PK STRL BLUE (TOWEL DISPOSABLE) IMPLANT
TRAP FLUID SMOKE EVACUATOR (MISCELLANEOUS) ×1 IMPLANT
WATER STERILE IRR 1000ML POUR (IV SOLUTION) ×1 IMPLANT
WRAPON POLAR PAD KNEE (MISCELLANEOUS) ×1

## 2023-04-25 NOTE — Plan of Care (Signed)
  Problem: Pain Management: Goal: General experience of comfort will improve Outcome: Progressing   Problem: Safety: Goal: Ability to remain free from injury will improve Outcome: Progressing

## 2023-04-25 NOTE — Anesthesia Procedure Notes (Signed)
Procedure Name: Intubation Date/Time: 04/25/2023 12:48 PM  Performed by: Monico Hoar, CRNAPre-anesthesia Checklist: Patient identified, Patient being monitored, Timeout performed, Emergency Drugs available and Suction available Patient Re-evaluated:Patient Re-evaluated prior to induction Oxygen Delivery Method: Circle system utilized Preoxygenation: Pre-oxygenation with 100% oxygen Induction Type: IV induction Ventilation: Mask ventilation without difficulty Laryngoscope Size: Mac and 3 Grade View: Grade I Tube type: Oral Tube size: 7.0 mm Number of attempts: 1 Airway Equipment and Method: Stylet Placement Confirmation: ETT inserted through vocal cords under direct vision, positive ETCO2 and breath sounds checked- equal and bilateral Secured at: 21 cm Tube secured with: Tape Dental Injury: Teeth and Oropharynx as per pre-operative assessment

## 2023-04-25 NOTE — H&P (Signed)
History of Present Illness: The patient is an 72 y.o. female seen in clinic today for follow-up evaluation of her right knee. Patient has known arthritis in her right knee and had a last injection on 12/28/2022. The injection before that she reports she got about 2 months of relief and after this injection she only got a few days of relief before the pain came back severely over the anterior medial knee described as a sharp throbbing shooting pain up to an 8 out of 10 which is worse when trying to walk and now requiring use of a rolling walker. She has not had any other trauma or falls. Her right knee function is now getting worse and limiting her activities on a daily basis and preventing her from participating. She reports this is how her left knee felt before her prior knee replacement is interested in discussing surgical fixation of her right knee at this time. The patient denies fevers, chills, numbness, tingling, shortness of breath, chest pain, recent illness, or any trauma. Video translator was used for the visit from [place] to [place].  Patient is a nondiabetic, she is a non-smoker, she reports she is lost some weight and her BMI is now down to 40.4.  Past Medical History: Past Medical History:  Diagnosis Date  Arthritis  Bronchitis 05/2017  Chronic kidney disease  Stones  DDD (degenerative disc disease), lumbar  Dementia (CMS/HHS-HCC)  Depression  GERD (gastroesophageal reflux disease)  Almost a year  Heart disease  Hypertension  Lupus  Neuromuscular disorder (CMS/HHS-HCC)  Sleep apnea  Systemic lupus erythematosus (CMS/HHS-HCC)  Thyroid disease   Past Surgical History: Past Surgical History:  Procedure Laterality Date  COLONOSCOPY 2010  Colon Polyps per pt  COLONOSCOPY 07/01/2017  PH Colon Polyps in past: CBF 06/2022  EGD 07/01/2017  No repeat per RTE  knee surgery Left 2022  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  [facility]  COLONOSCOPY 08/24/2021  Normal colon  biopsies/Repeat 63yrs/SMR  APPENDECTOMY  CESAREAN SECTION  GASTRIC BYPASS OPEN  Left breast surgery   Past Family History: Family History  Problem Relation Age of Onset  Breast cancer Maternal Aunt  Breast cancer Cousin   Medications: Current Outpatient Medications  Medication Sig Dispense Refill  acetaminophen (TYLENOL) 325 MG tablet Take 325 mg by mouth every 6 (six) hours as needed for Pain  albuterol 90 mcg/actuation inhaler Inhale 2 inhalations into the lungs every 6 (six) hours as needed for Wheezing  alendronate (FOSAMAX) 70 MG tablet TAKE 1 TABLET EVERY 7 DAYS TAKE WITH A FULL GLASS OF WATER. DO NOT LIE DOWN FOR THE NEXT 30 MIN. 12 tablet 1  buprenorphine (BUTRANS TD) Place onto the skin  busPIRone (BUSPAR) 10 MG tablet TAKE 1 TABLET BY MOUTH EVERY DAY 90 tablet 3  celecoxib (CELEBREX) 100 MG capsule Take 1 capsule (100 mg total) by mouth 2 (two) times daily 60 capsule 3  cetirizine (ZYRTEC) 10 MG tablet  cholecalciferol (VITAMIN D3) 1000 unit tablet  cholestyramine (QUESTRAN) 4 gram oral powder  cyanocobalamin (VITAMIN B12) 1000 MCG tablet Take by mouth  diclofenac (VOLTAREN) 1 % topical gel Apply 2 g topically 4 (four) times daily 100 g 0  dicyclomine (BENTYL) 10 mg capsule  docusate (COLACE) 100 MG capsule Take 1 capsule by mouth 2 (two) times daily  escitalopram oxalate (LEXAPRO) 10 MG tablet Take 1 tablet (10 mg total) by mouth once daily 90 tablet 3  estradioL (ESTRACE) 0.01 % (0.1 mg/gram) vaginal cream Place vaginally  famotidine (PEPCID) 20 MG  tablet Take 1 tablet by mouth 2 (two) times daily  ferrous sulfate 325 (65 FE) MG tablet TAKE 1 TABLET BY MOUTH EVERY DAY WITH BREAKFAST 90 tablet 1  fluticasone propionate (FLONASE) 50 mcg/actuation nasal spray  folic acid (FOLVITE) 1 MG tablet TAKE 1 TABLET BY MOUTH EVERY DAY 90 tablet 1  gabapentin (NEURONTIN) 800 MG tablet Take 800 mg by mouth 2 (two) times daily.  levothyroxine (SYNTHROID, LEVOTHROID) 75 MCG tablet Take  75 mcg by mouth once daily. Take on an empty stomach with a glass of water at least 30-60 minutes before breakfast.  lidocaine (LIDODERM) 5 % patch  LORazepam (ATIVAN) 0.5 MG tablet Take 0.5 mg by mouth at bedtime  METAMUCIL 0.4 gram Cap  methenamine hippurate (HIPREX) 1 gram tablet Take by mouth  methocarbamoL (ROBAXIN) 500 MG tablet Take 1 tablet by mouth once daily  metoprolol succinate (TOPROL-XL) 25 MG XL tablet Take 25 mg by mouth once daily for Blood Pressure  MINTOX MAXIMUM STRENGTH 400-400-40 mg/5 mL suspension  mirabegron (MYRBETRIQ) 25 mg ER Tablet Take 1 tablet (25 mg total) by mouth once daily 30 tablet 4  miscellaneous medical supply Misc Solid support carpal tunnel wrist splint 1 each 0  nitrofurantoin, macrocrystal-monohydrate, (MACROBID) 100 MG capsule Take 100 mg by mouth 2 (two) times daily  nystatin (MYCOSTATIN) 100,000 unit/gram cream  oxyCODONE-acetaminophen (PERCOCET) 5-325 mg tablet  pantoprazole (PROTONIX) 20 MG DR tablet  QUEtiapine (SEROQUEL) 50 MG tablet  solifenacin (VESICARE) 5 MG tablet Take 1 tablet (5 mg total) by mouth once daily 30 tablet 11  traZODone (DESYREL) 100 MG tablet Take by mouth nightly.  TURMERIC ORAL Take by mouth  URINARY PAIN RELIEF 95 mg tablet Take 95 mg by mouth 3 (three) times daily as needed  ergocalciferol, vitamin D2, 1,250 mcg (50,000 unit) capsule Take 1 capsule (50,000 Units total) by mouth once a week for 12 doses 12 capsule 0   No current facility-administered medications for this visit.   Allergies: Allergies  Allergen Reactions  Cholestyramine Other (See Comments)  severe    Visit Vitals: There were no vitals filed for this visit.   Review of Systems:  A comprehensive 14 point ROS was performed, reviewed, and the pertinent orthopaedic findings are documented in the HPI.  Physical Exam: General/Constitutional: No apparent distress: well-nourished and well developed. Eyes: Pupils equal, round with synchronous  movement. Pulmonary exam: Lungs clear to auscultation bilaterally no wheezing rales or rhonchi Cardiac exam: Regular rate and rhythm no obvious murmurs rubs or gallops. Integumentary: No impressive skin lesions present, except as noted in detailed exam. Neuro/Psych: Normal mood and affect, oriented to person, place and time.  Comprehensive Knee Exam: Gait Antalgic using rolling walker  Alignment Neutral   Inspection Right  Skin Normal appearance with no obvious deformity. No ecchymosis or erythema.  Soft Tissue No focal soft tissue swelling  Quad Atrophy None   Palpation  Right  Tenderness Medial joint line tenderness palpation and parapatellar tenderness  Crepitus + patellofemoral crepitus  Effusion None   Range of Motion Right  Flexion 0-115  Extension Full knee extension without hyperextension   Ligamentous Exam Right  Lachman Normal  Valgus 0 Normal  Valgus 30 Normal  Varus 0 Normal  Varus 30 Normal  Anterior Drawer Normal  Posterior Drawer Normal   Meniscal Exam Right  Hyperflexion Test Positive  Hyperextension Test Positive  McMurray's Positive   Neurovascular Right  Quadriceps Strength 5/5  Hamstring Strength 5/5  Hip Abductor Strength 4/5  Distal  Motor Normal  Distal Sensory Normal light touch sensation  Distal Pulses Normal     Imaging Studies: I have reviewed AP, lateral,sunrise, and flexed PA weight bearing knee X-rays (4 views) of the right knee ordered and taken today in the office show moderate/severe degenerative changes with medial and patellofemoral joint space narrowing with osteophyte formation, subchondral cysts and sclerosis. Kellgren-Lawrence grade 3/4. AP, sunrise, and flexed PA of the left knee also show status post total knee arthroplasty with components in unchanged position from previous films no evidence of periprosthetic fracture or loosening.   Assessment:  ICD-10-CM  1. Primary osteoarthritis of right knee M17.11     Plan: Shelly Freeman is a 72 year old female who presents with right knee bone on bone arthritis. Based upon the patient's continued symptoms and failure to respond to conservative treatment, I have recommended a right total knee replacement for this patient. A long discussion took place with the patient describing what a total joint replacement is and what the procedure would entail. A knee model, similar to the implants that will be used during the operation, was utilized to demonstrate the implants. Choices of implant manufactures were discussed and reviewed. The ability to secure the implant utilizing cement or cementless (press fit) fixation was discussed. The approach and exposure was discussed.   The hospitalization and post-operative care and rehabilitation were also discussed. The use of perioperative antibiotics and DVT prophylaxis were discussed. The risk, benefits and alternatives to a surgical intervention were discussed at length with the patient. The patient was also advised of risks related to the medical comorbidities and elevated body mass index (BMI). A lengthy discussion took place to review the most common complications including but not limited to: stiffness, loss of function, complex regional pain syndrome, deep vein thrombosis, pulmonary embolus, heart attack, stroke, infection, wound breakdown, numbness, intraoperative fracture, damage to nerves, tendon,muscles, arteries or other blood vessels, death and other possible complications from anesthesia. The patient was told that we will take steps to minimize these risks by using sterile technique, antibiotics and DVT prophylaxis when appropriate and follow the patient postoperatively in the office setting to monitor progress. The possibility of recurrent pain, no improvement in pain and actual worsening of pain were also discussed with the patient.   Patient asked about and confirms no history of any reactions to metal or metal allergy in the  past and did well after her left total knee replacement without any issues.  The discharge plan of care focused on the patient going home following surgery. The patient was encouraged to make the necessary arrangements to have someone stay with them when they are discharged home.   The benefits of surgery were discussed with the patient including the potential for improving the patient's current clinical condition through operative intervention. Alternatives to surgical intervention including continued conservative management were also discussed in detail. All questions were answered to the satisfaction of the patient. The patient participated and agreed to the plan of care as well as the use of the recommended implants for their total knee replacement surgery. An information packet was given to the patient to review prior to surgery.   The patient received clearance for surgery. All questions answered patient agrees above plan preparations for right total knee replacement.  Portions of this record have been created using Scientist, clinical (histocompatibility and immunogenetics). Dictation errors have been sought, but may not have been identified and corrected.  Reinaldo Berber MD

## 2023-04-25 NOTE — Interval H&P Note (Signed)
Patient history and physical updated. Consent reviewed including risks, benefits, and alternatives to surgery. Patient agrees with above plan to proceed with right total knee arthroplasty  

## 2023-04-25 NOTE — Op Note (Signed)
Patient Name: Shareeta Sjoblom  UEA:[id number]  Pre-Operative Diagnosis: Right knee Osteoarthritis  Post-Operative Diagnosis: (same)  Procedure: Right Total Knee Arthroplasty  Components/Implants: Femur: Persona Size 8 Narrow CR   Tibia: Persona Size C w/ 14x103mm stem extension  Poly: 11mm MC  Patella: 32x8.74mm symmetric  Femoral Valgus Cut Angle: 5 degrees  Distal Femoral Re-cut: none  Patella Resurfacing: yes   Date of Surgery: 04/25/2023  Surgeon: Reinaldo Berber MD  Assistant: Amador Cunas PA (present and scrubbed throughout the case, critical for assistance with exposure, retraction, instrumentation, and closure)   Anesthesiologist: Lorette Ang  Anesthesia: General   Tourniquet Time: 69 min  EBL: 50cc  IVF: 600cc  Complications: None   Brief history: The patient is a 72 year old female with a history of osteoarthritis of the right knee with pain limiting their range of motion and activities of daily living, which has failed multiple attempts at conservative therapy.  The risks and benefits of total knee arthroplasty as definitive surgical treatment were discussed with the patient, who opted to proceed with the operation.  After outpatient medical clearance and optimization was completed the patient was admitted to [facility] for the procedure.  All preoperative films were reviewed and an appropriate surgical plan was made prior to surgery. Preoperative range of motion was  0 to 115  Description of procedure: The patient was brought to the operating room where laterality was confirmed by all those present to be the right side.   Spinal anesthesia was administered and the patient received an intravenous dose of antibiotics for surgical prophylaxis and a dose of tranexamic acid.  Patient is positioned supine on the operating room table with all bony prominences well-padded.  A well-padded tourniquet was applied to the right thigh.  The knee was then  prepped and draped in usual sterile fashion with multiple layers of adhesive and nonadhesive drapes.  All of those present in the operating room participated in a surgical timeout laterality and patient were confirmed.   An Esmarch was wrapped around the extremity and the leg was elevated and the knee flexed.  The tourniquet was inflated to a pressure of 275 mmHg. The Esmarch was removed and the leg was brought down to full extension.  The patella and tibial tubercle identified and outlined using a marking pen and a midline skin incision was made with a knife carried through the subcutaneous tissue down to the extensor retinaculum.  After exposure of the extensor mechanism the medial parapatellar arthrotomy was performed with a scalpel and electrocautery extending down medial and distal to the tibial tubercle taking care to avoid incising the patellar tendon.   A standard medial release was performed over the proximal tibia.  The knee was brought into extension in order to excise the fat pad taking care not to damage the patella tendon.  The superior soft tissue was removed from the anterior surface of the distal femur to visualize for the procedure.  The knee was then brought into flexion with the patella subluxed laterally and subluxing the tibia anteriorly.  The ACL was transected and removed with electrocautery and additional soft tissue was removed from the proximal surface of the tibia to fully expose. The PCL was found to be intact and was partially preserved.  An extramedullary tibial cutting guide was then applied to the leg with a spring-loaded ankle clamp placed around the distal tibia just above the malleoli the angulation of the guide was adjusted to give some posterior slope in the tibial  resection with an appropriate varus/valgus alignment.  The resection guide was then pinned to the proximal tibia and the proximal tibial surface was resected with an oscillating saw.  Careful attention was paid  to ensure the blade did not disrupt any of the soft tissues including any lateral or medial ligament.  Attention was then turned to the femur, with the knee slightly flexed a opening drill was used to enter the medullary canal of the femur.  After removing the drill marrow was suctioned out to decompress the distal femur.  An intramedullary femoral guide was then inserted into the drill hole and the alignment guide was seated firmly against the distal end of the medial femoral condyle.  The distal femoral cutting guide was then attached and pinned securely to the anterior surface of the femur and the intramedullary rod and alignment guide was removed.  Distal femur resection was then performed with an oscillating saw with retractors protecting medial and laterally.   The distal cutting block was then removed and the extension gap was checked with a spacer.  Extension gap was found to be appropriately sized to accommodate the spacer block.   The femoral sizing guide was then placed securely into the posterior condyles of the femur and the femoral size was measured and determined to be 85.  The size 8; 4-in-1 cutting guide was placed in position and secured with 2 pins.  The anterior posterior and chamfer resections were then performed with an oscillating saw.  Bony fragments and osteophytes were then removed.  Using a lamina spreader the posterior medial and lateral condyles were checked for additional osteophytes and posterior soft tissue remnants.  Any remaining meniscus was removed at this time.  Periarticular injection was performed in the meniscal rims and posterior capsule with aspiration performed to ensure no intravascular injection.   The tibia was then exposed and the tibial trial was pinned onto the plateau after confirming appropriate orientation and rotation.  Using the drill bushing the tibia was prepared to the appropriate drill depth.  Tibial broach impactor was then driven through the punch  guide using a mallet.  The femoral trial component was then inserted onto the femur. A trial tibial polyethylene bearing was then placed and the knee was reduced.  The knee achieved full extension with no hyperextension and was found to be balanced in flexion and extension with the trials in place.  The knee was then brought into full extension the patella was everted and held with 2 Kocher clamps.  The articular surface of the patella was then resected with an patella reamer and saw after careful measurement with a caliper.  The patella was then prepared with the drill guide and a trial patella was placed.  The knee was then taken through range of motion and it was found that the patella articulated appropriately with the trochlea and good patellofemoral motion without subluxation.    The correct final components for implantation were confirmed and opened by the circulator nurse.  The prepared surfaces of the patella femur and tibia were cleaned with pulsatile lavage to remove all blood fat and other material and then the surfaces were dried.  2 bags of cement were mixed under vacuum and the components were cemented into place.  Excess cement was removed with curettes and forceps. A trial polyethylene tibial component was placed and the knee was brought into extension to allow the cement to set.  At this time the periarticular injection cocktail was placed in the  soft tissues surrounding the knee.  After full curing of the cement the balance of the knee was checked again and the final polyethylene size was confirmed. The tibial component was irrigated and locking mechanism checked to ensure it was clear of debris. The real polyethylene tibial component was implanted and the knee was brought through a range of motion.   The knee was then irrigated with copious amount of normal saline via pulsatile lavage to remove all loose bodies and other debris.  The knee was then irrigated with surgiphor betadine based wash  and reirrigated with saline.  The tourniquet was then dropped and all bleeding vessels were identified and coagulated.  The arthrotomy was approximated with #1 Vicryl and closed with #1 Stratafix suture.  The knee was brought into slight flexion and the subcutaneous tissues were closed with 0 Vicryl, 2-0 Vicryl and a running subcuticular 4-0 stratafix barbed suture.  Skin was then glued with Dermabond.  A sterile adhesive dressing was then placed along with a sequential compression device to the calf, a Ted stocking, and a cryotherapy cuff.   Sponge, needle, and Lap counts were all correct at the end of the case.   The patient was transferred off of the operating room table to a hospital bed, good pulses were found distally on the operative side.  The patient was transferred to the recovery room in stable condition.

## 2023-04-25 NOTE — Anesthesia Postprocedure Evaluation (Signed)
Anesthesia Post Note  Patient: Shelly Freeman  Procedure(s) Performed: TOTAL KNEE ARTHROPLASTY (Right: Knee)  Patient location during evaluation: PACU Anesthesia Type: General Level of consciousness: awake and alert Pain management: pain level controlled Vital Signs Assessment: post-procedure vital signs reviewed and stable Respiratory status: spontaneous breathing, nonlabored ventilation, respiratory function stable and patient connected to nasal cannula oxygen Cardiovascular status: blood pressure returned to baseline and stable Postop Assessment: no apparent nausea or vomiting Anesthetic complications: no  No notable events documented.   Last Vitals:  Vitals:   04/25/23 1510 04/25/23 1514  BP:    Pulse: 72 72  Resp: 15 (!) 25  Temp:    SpO2: 99% 95%    Last Pain:  Vitals:   04/25/23 1514  TempSrc:   PainSc: 9                  [address]

## 2023-04-25 NOTE — Transfer of Care (Signed)
Immediate Anesthesia Transfer of Care Note  Patient: Shelly Freeman  Procedure(s) Performed: TOTAL KNEE ARTHROPLASTY (Right: Knee)  Patient Location: PACU  Anesthesia Type:General  Level of Consciousness: awake and alert   Airway & Oxygen Therapy: Patient Spontanous Breathing and Patient connected to nasal cannula oxygen  Post-op Assessment: Report given to RN and Post -op Vital signs reviewed and stable  Post vital signs: Reviewed and stable  Last Vitals:  Vitals Value Taken Time  BP 141/74 04/25/23 1456  Temp 36.3 C 04/25/23 1456  Pulse 71 04/25/23 1500  Resp 17 04/25/23 1500  SpO2 99 % 04/25/23 1500  Vitals shown include unfiled device data.  Last Pain:  Vitals:   04/25/23 1131  TempSrc: Oral         Complications: No notable events documented.

## 2023-04-25 NOTE — Anesthesia Preprocedure Evaluation (Signed)
Anesthesia Evaluation  Patient identified by MRN, date of birth, ID band Patient awake    Reviewed: Allergy & Precautions, H&P , NPO status , Patient's Chart, lab work & pertinent test results, reviewed documented beta blocker date and time   Airway Mallampati: II   Neck ROM: full    Dental  (+) Poor Dentition   Pulmonary shortness of breath and with exertion, sleep apnea , pneumonia, resolved, COPD,  COPD inhaler   Pulmonary exam normal        Cardiovascular Exercise Tolerance: Poor hypertension, On Medications + angina with exertion + CAD  Normal cardiovascular exam+ Valvular Problems/Murmurs  Rhythm:regular Rate:Normal     Neuro/Psych  PSYCHIATRIC DISORDERS Anxiety Depression   Dementia  Neuromuscular disease    GI/Hepatic Neg liver ROS,GERD  Medicated,,  Endo/Other  Hypothyroidism    Renal/GU      Musculoskeletal   Abdominal   Peds  Hematology  (+) Blood dyscrasia, anemia   Anesthesia Other Findings Patient refused regional anesthesia. Discussed risks of general anesthesia. Patient stated she understood and was willing to proceed.   Past Medical History: No date: Anginal pain (HCC) No date: Arthritis No date: Collagen vascular disease (HCC) No date: COPD (chronic obstructive pulmonary disease) (HCC) 05/14/2016: DDD (degenerative disc disease), lumbar No date: Depression No date: Dizziness No date: Dyspnea No date: GERD (gastroesophageal reflux disease) No date: Heart valve problem     Comment:  per grandaughter need surgery but they told her may not               survive; No date: History of kidney stones No date: Hypertension No date: Hypothyroidism No date: Lupus (HCC) No date: Neuromuscular disorder (HCC) No date: Sleep apnea     Comment:  no CPAP No date: Thyroid disease 05/14/2016: Vitamin D deficiency Past Surgical History: No date: APPENDECTOMY No date: BREAST BIOPSY; Left     Comment:   neg 11/13/2021: CATARACT EXTRACTION W/PHACO; Left     Comment:  Procedure: CATARACT EXTRACTION PHACO AND INTRAOCULAR               LENS PLACEMENT (IOC) LEFT 3.63 00:35.9;  Surgeon: Nevada Crane, MD;  Location: [facility];                Service: Ophthalmology;  Laterality: Left; 12/04/2021: CATARACT EXTRACTION W/PHACO; Right     Comment:  Procedure: CATARACT EXTRACTION PHACO AND INTRAOCULAR               LENS PLACEMENT (IOC) RIGHT;  Surgeon: Nevada Crane,              MD;  Location: [facility];  Service:               Ophthalmology;  Laterality: Right;  3.11 00:32.6 No date: CESAREAN SECTION     Comment:  x 2 08/24/2021: COLONOSCOPY; N/A     Comment:  Procedure: COLONOSCOPY;  Surgeon: Jaynie Collins,              DO;  Location: [facility] ENDOSCOPY;  Service:               Gastroenterology;  Laterality: N/A;  SPANISH INTERPRETER 07/01/2017: COLONOSCOPY WITH PROPOFOL; N/A     Comment:  Procedure: COLONOSCOPY WITH PROPOFOL;  Surgeon: Scot Jun, MD;  Location: [facility]  ENDOSCOPY;  Service:               Endoscopy;  Laterality: N/A; 07/01/2017: ESOPHAGOGASTRODUODENOSCOPY (EGD) WITH PROPOFOL; N/A     Comment:  Procedure: ESOPHAGOGASTRODUODENOSCOPY (EGD) WITH               PROPOFOL;  Surgeon: Scot Jun, MD;  Location:               [facility] ENDOSCOPY;  Service: Endoscopy;  Laterality: N/A; No date: GASTRIC BYPASS 06/14/2022: LIPOMA EXCISION; Left     Comment:  Procedure: EXCISION LIPOMA, left chest wall;  Surgeon:               Leafy Ro, MD;  Location: [facility] ORS;  Service:               General;  Laterality: Left; No date: LITHOTRIPSY 10/25/2020: TOTAL KNEE ARTHROPLASTY; Left     Comment:  Procedure: TOTAL KNEE ARTHROPLASTY;  Surgeon: Kennedy Bucker, MD;  Location: [facility] ORS;  Service: Orthopedics;               Laterality: Left; No date: TUBAL LIGATION BMI    Body Mass Index: 41.28 kg/m      Reproductive/Obstetrics negative OB ROS                             Anesthesia Physical Anesthesia Plan  ASA: 3  Anesthesia Plan: General ETT and General   Post-op Pain Management:    Induction: Intravenous  PONV Risk Score and Plan: 3 and Ondansetron, Dexamethasone and Midazolam  Airway Management Planned: Oral ETT  Additional Equipment:   Intra-op Plan:   Post-operative Plan: Extubation in OR  Informed Consent: I have reviewed the patients History and Physical, chart, labs and discussed the procedure including the risks, benefits and alternatives for the proposed anesthesia with the patient or authorized representative who has indicated his/her understanding and acceptance.     Dental Advisory Given  Plan Discussed with: Anesthesiologist, CRNA and Surgeon  Anesthesia Plan Comments: (Patient consented for risks of anesthesia including but not limited to:  - adverse reactions to medications - damage to eyes, teeth, lips or other oral mucosa - nerve damage due to positioning  - sore throat or hoarseness - Damage to heart, brain, nerves, lungs, other parts of body or loss of life  Patient voiced understanding and assent.)       Anesthesia Quick Evaluation

## 2023-04-26 ENCOUNTER — Encounter: Payer: Self-pay | Admitting: Orthopedic Surgery

## 2023-04-26 DIAGNOSIS — M1711 Unilateral primary osteoarthritis, right knee: Secondary | ICD-10-CM | POA: Diagnosis not present

## 2023-04-26 LAB — CBC
HCT: 35.4 % — ABNORMAL LOW (ref 36.0–46.0)
Hemoglobin: 11.1 g/dL — ABNORMAL LOW (ref 12.0–15.0)
MCH: 30.7 pg (ref 26.0–34.0)
MCHC: 31.4 g/dL (ref 30.0–36.0)
MCV: 98.1 fL (ref 80.0–100.0)
Platelets: 156 10*3/uL (ref 150–400)
RBC: 3.61 MIL/uL — ABNORMAL LOW (ref 3.87–5.11)
RDW: 13.7 % (ref 11.5–15.5)
WBC: 7.6 10*3/uL (ref 4.0–10.5)
nRBC: 0 % (ref 0.0–0.2)

## 2023-04-26 LAB — BASIC METABOLIC PANEL
Anion gap: 10 (ref 5–15)
BUN: 22 mg/dL (ref 8–23)
CO2: 24 mmol/L (ref 22–32)
Calcium: 8.5 mg/dL — ABNORMAL LOW (ref 8.9–10.3)
Chloride: 106 mmol/L (ref 98–111)
Creatinine, Ser: 0.92 mg/dL (ref 0.44–1.00)
GFR, Estimated: >60 mL/min (ref >60)
Glucose, Bld: 122 mg/dL — ABNORMAL HIGH (ref 70–99)
Potassium: 4.8 mmol/L (ref 3.5–5.1)
Sodium: 140 mmol/L (ref 135–145)

## 2023-04-26 MED ORDER — ACETAMINOPHEN 500 MG PO TABS: 1000.0000 mg | ORAL_TABLET | Freq: Three times a day (TID) | ORAL | Status: AC

## 2023-04-26 MED ORDER — ENOXAPARIN SODIUM 40 MG/0.4ML IJ SOSY: 40.0000 mg | PREFILLED_SYRINGE | INTRAMUSCULAR | Status: DC

## 2023-04-26 MED ORDER — TRAMADOL HCL 50 MG PO TABS
ORAL_TABLET | ORAL | Status: AC
  Filled 2023-04-26: qty 1

## 2023-04-26 MED ORDER — DOCUSATE SODIUM 100 MG PO CAPS
ORAL_CAPSULE | ORAL | Status: AC
  Filled 2023-04-26: qty 1

## 2023-04-26 MED ORDER — KETOROLAC TROMETHAMINE 15 MG/ML IJ SOLN
INTRAMUSCULAR | Status: AC
  Filled 2023-04-26: qty 1

## 2023-04-26 MED ORDER — ONDANSETRON HCL 4 MG PO TABS: 4.0000 mg | ORAL_TABLET | Freq: Four times a day (QID) | ORAL | Status: DC | PRN

## 2023-04-26 MED ORDER — GABAPENTIN 300 MG PO CAPS
ORAL_CAPSULE | ORAL | Status: AC
  Filled 2023-04-26: qty 1

## 2023-04-26 MED ORDER — MORPHINE SULFATE (PF) 4 MG/ML IV SOLN
INTRAVENOUS | Status: AC
  Filled 2023-04-26: qty 1

## 2023-04-26 MED ORDER — HYDROCODONE-ACETAMINOPHEN 5-325 MG PO TABS
ORAL_TABLET | ORAL | Status: AC
  Filled 2023-04-26: qty 2

## 2023-04-26 MED ORDER — CEFAZOLIN SODIUM-DEXTROSE 2-4 GM/100ML-% IV SOLN
INTRAVENOUS | Status: AC
  Filled 2023-04-26: qty 100

## 2023-04-26 MED ORDER — GABAPENTIN 300 MG PO CAPS
ORAL_CAPSULE | ORAL | Status: AC
Start: 2023-04-26 — End: ?
  Filled 2023-04-26: qty 1

## 2023-04-26 MED ORDER — PANTOPRAZOLE SODIUM 40 MG PO TBEC
DELAYED_RELEASE_TABLET | ORAL | Status: AC
Start: 2023-04-26 — End: ?
  Filled 2023-04-26: qty 1

## 2023-04-26 MED ORDER — ACETAMINOPHEN 500 MG PO TABS
ORAL_TABLET | ORAL | Status: AC
  Filled 2023-04-26: qty 2

## 2023-04-26 MED ORDER — DOCUSATE SODIUM 100 MG PO CAPS: 100.0000 mg | ORAL_CAPSULE | Freq: Two times a day (BID) | ORAL | Status: AC

## 2023-04-26 MED ORDER — ENOXAPARIN SODIUM 30 MG/0.3ML IJ SOSY
PREFILLED_SYRINGE | INTRAMUSCULAR | Status: AC
  Filled 2023-04-26: qty 0.3

## 2023-04-26 MED ORDER — HYDROCODONE-ACETAMINOPHEN 5-325 MG PO TABS: 1.0000 | ORAL_TABLET | Freq: Four times a day (QID) | ORAL | 0 refills | Status: DC | PRN

## 2023-04-26 NOTE — Progress Notes (Signed)
Spoke to [employer] with [facility].  She states she will be coordinating the patient's discharge planning.  Almira Coaster would like to be contacted with any information regarding patient discharge. She said she can help coordinate SNF placement, etc.   Gina [facility] RN  Ph: [phone]

## 2023-04-26 NOTE — Plan of Care (Signed)
Documented

## 2023-04-26 NOTE — Discharge Summary (Addendum)
Physician Discharge Summary  Patient ID: Shelly Freeman MRN: [id number] DOB/AGE: [birth year] 72 y.o.  Admit date: 04/25/2023 Discharge date: 04/26/2023  Admission Diagnoses:  S/P TKR (total knee replacement) using cement, right [Z96.651]   Discharge Diagnoses: Patient Active Problem List   Diagnosis Date Noted   S/P TKR (total knee replacement) using cement, right 04/25/2023   Chronic pain syndrome 02/13/2023   Lumbar radicular pain, acute on chronic, intractable 02/10/2023   History of nephrolithiasis 02/10/2023   Hypotension due to medication 02/10/2023   Intractable pain 02/10/2023   Sleep apnea    Cystitis 09/13/2022   History of ESBL E. 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   Bilateral pneumonia 08/29/2020   AKI (acute kidney injury) (HCC) 08/29/2020   COPD (chronic obstructive pulmonary disease) (HCC) 08/29/2020   Fall at home, initial encounter 08/29/2020   CAP (community acquired pneumonia) 08/29/2020   Osteoarthritis of left knee 10/01/2019   Diarrhea 09/17/2019   Dizziness 09/17/2019   Other fatigue 06/09/2019   Shortness of breath 06/09/2019   Obesity, Class III, BMI 40-49.9 (morbid obesity) (HCC) 05/23/2019   UTI (urinary tract infection) 05/23/2019   Right rib fracture 05/23/2019   SIRS (systemic inflammatory response syndrome) (HCC) 05/23/2019   Bacteremia due to Gram-negative bacteria 05/23/2019   Chest pain at rest 05/19/2019   Pain in rib  05/19/2019   Acute bronchiolitis 01/28/2019   Iron deficiency anemia due to chronic blood loss 10/29/2018   Primary osteoarthritis involving multiple joints 10/29/2018   Right carpal tunnel syndrome 07/31/2017   Cervical radiculopathy 07/16/2017   Left anterior knee pain 10/24/2016   Right upper quadrant pain 10/24/2016   Urination pain 06/26/2016   DDD (degenerative disc disease), cervical 05/14/2016   Lipoma of skin 05/14/2016   Vitamin D deficiency 05/14/2016   Major neurocognitive disorder, due to vascular disease, with behavioral disturbance, mild (HCC) 04/15/2016   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/04/2016   Lupus 04/04/2016   GERD (gastroesophageal reflux disease) 04/04/2016   COPD with acute bronchitis (HCC) 04/04/2016   Acute respiratory failure with hypoxia (HCC) 04/03/2016   Encephalopathy, metabolic 04/03/2016   Hypokalemia 04/03/2016   Pain in limb 04/03/2016   Hypothyroidism 04/03/2016   Essential hypertension 04/03/2016   Intentional opiate overdose (HCC) 03/30/2016   Age related osteoporosis 03/19/2016   Right arm pain 03/19/2016   Encounter for long-term (current) use of high-risk medication 03/19/2016   Primary osteoarthritis of both knees 03/19/2016    Past Medical History:  Diagnosis Date   Acute respiratory failure with hypoxia [facility])    Age related osteoporosis    Angina at rest [facility])    Anginal pain (HCC)    Arthritis    Asthma    CAD (coronary artery disease)    a.) PCI/stent (vessel/type unknown) in approx 2017-2018 [facility]); b.) MV 05/28/2016: no ischemia; c.) cardiac PET CT 03/18/2023: no ischemia   Cerebral microvascular disease    Cervical disc disorder with radiculopathy    Chronic pain syndrome  a.) on buprenorphine   COPD (chronic obstructive pulmonary disease) (HCC)    DDD (degenerative disc disease), lumbar 05/14/2016   Depression    Diastolic dysfunction    a.) TTE 01/22/2018L EF >55%, no RWMAs,  G1DD, norm RVSF, triv AR/PR, mild MR/TR; b.) TTE 08/06/2019: EF >55%, no RWMAs, mild LVH, norm RVSF, mild MR/TR, RVSP 30.5; c.) TTE 12/19/2022: EF >55%, no RWMAs, norm RVSF, PASP 37   Dizziness    Dyspnea    GERD (gastroesophageal reflux disease)    Gram-negative bacteremia (E.coli) 01/26/2021   Heart valve problem    per grandaughter need surgery but they told her may not survive;   Hepatomegaly    Hiatal hernia    a.) s/p Nissen fundoplication; slipped (failed) fundoplication with recurrent hiatal hernia   History of 2019 novel coronavirus disease (COVID-19)    History of bilateral cataract extraction 11/2021   History of infection due to ESBL Escherichia coli    History of kidney stones    History of Roux-en-Y gastric bypass    HLD (hyperlipidemia)    Hypertension    Hypothyroidism    IDA (iron deficiency anemia)    Insomnia    a.) taking trazodone PRN   Intentional opioid overdose (HCC)    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 female who was admitted 04/25/2023 with a diagnosis of S/P TKR (total knee replacement) using cement, right and went to the operating room on 04/25/2023 and underwent the above named procedures.    Surgeries: Procedure(s): TOTAL KNEE ARTHROPLASTY on 04/25/2023 Patient tolerated the surgery well. Taken to PACU where she was stabilized and then transferred to the orthopedic floor.  Started on Lovenox 30 mg 12 hrs. TEDs and SCDs applied  bilaterally. Heels elevated on bed. No evidence of DVT. Negative Homan. Physical therapy started on day #1 for gait training and transfer. OT started day #1 for ADL and assisted devices.  Patient's IV was d/c on day #1. Patient was able to participate in PT but need moderate assistance. Due to no help at home,  PT recommending discharge to SNF.  On post op day #1 patient was stable and ready for discharge to SNF.  Implants: Femur: Persona Size 8 Narrow CR   Tibia: Persona Size C w/ 14x59mm stem extension  Poly: 11mm MC  Patella: 32x8.74mm symmetric   She was given perioperative antibiotics:  Anti-infectives (From admission, onward)    Start     Dose/Rate Route Frequency Ordered Stop   04/25/23 1930  ceFAZolin (ANCEF) IVPB 2g/100 mL premix        2 g 200 mL/hr over 30 Minutes Intravenous Every 6 hours 04/25/23 1722 04/26/23 0341   04/25/23 0600  ceFAZolin (ANCEF) IVPB 2g/100 mL premix        2 g 200 mL/hr over 30 Minutes Intravenous On call to O.R. 04/24/23 2153 04/25/23 1310     .  She was given sequential  compression devices, early ambulation, and Lovenox TEDs for DVT prophylaxis.  She benefited maximally from the hospital stay and there were no complications.    Recent vital signs:  Vitals:   04/26/23 0613 04/26/23 0907  BP: (!) 116/51 107/63  Pulse: 66 72  Resp: 16 17  Temp: 98.4 F (36.9 C) (!) 97 F (36.1 C)  SpO2: 94% 94%    Recent laboratory studies:  Lab Results  Component Value Date   HGB 11.1 (L) 04/26/2023   HGB 13.4 02/13/2023   HGB 13.2 02/12/2023   Lab Results  Component Value Date   WBC 7.6 04/26/2023   PLT 156 04/26/2023   Lab Results  Component Value Date   INR 1.0 02/12/2023   Lab Results  Component Value Date   NA 140 04/26/2023   K 4.8 04/26/2023   CL 106 04/26/2023   CO2 24 04/26/2023   BUN 22 04/26/2023   CREATININE 0.92 04/26/2023   GLUCOSE 122 (H) 04/26/2023    Discharge Medications:   Allergies as of 04/26/2023        Reactions   Cholestyramine Other (See Comments)   severe        Medication List     STOP taking these medications    acetaminophen 650 MG CR tablet Commonly known as: TYLENOL Replaced by: acetaminophen 500 MG tablet       TAKE these medications    acetaminophen 500 MG tablet Commonly known as: TYLENOL Take 2 tablets (1,000 mg total) by mouth every 8 (eight) hours. Replaces: acetaminophen 650 MG CR tablet   albuterol 108 (90 Base) MCG/ACT inhaler Commonly known as: VENTOLIN HFA Inhale into the lungs. Inhale 2 puffs into the lungs every 4 (four) hours as needed for wheezing or shortness of breath.   alendronate 70 MG tablet Commonly known as: FOSAMAX Take 70 mg by mouth every Monday. Take with a full glass of water on an empty stomach.   atorvastatin 40 MG tablet Commonly known as: LIPITOR Take 40 mg by mouth at bedtime.   benzonatate 200 MG capsule Commonly known as: TESSALON Take 200 mg by mouth 3 (three) times daily as needed for cough.   buprenorphine 7.5 MCG/HR Commonly known as: BUTRANS Place 1 patch onto the skin once a week. (Apply one patch to upper back/shoulder, outer arm, or upper chest alternating sites weekly on Wednesday at [facility]. May reuse site every 21 days.)   busPIRone 15 MG tablet Commonly known as: BUSPAR Take 15 mg by mouth 2 (two) times daily.   celecoxib 100 MG capsule Commonly known as: CELEBREX Take 100 mg by mouth 2 (two) times daily.   cholecalciferol 25 MCG (1000 UNIT) tablet Commonly known as: VITAMIN D3 Take 1,000 Units by mouth daily.   cyanocobalamin 1000 MCG tablet Commonly known as: VITAMIN B12 Take 1,000 mcg by mouth daily.   Dextromethorphan-guaiFENesin 10-100 MG/5ML liquid Take 10 mLs by mouth 3 (three) times daily as needed (for cough and nasal congestion).   diclofenac Sodium 1 % Gel Commonly known as: VOLTAREN Apply 4 g topically 4 (four) times daily. For pain in the right knee. Rub into knee for 1 minute for  each application.   docusate sodium 100 MG capsule Commonly known as: COLACE Take 1 capsule (100 mg total) by mouth 2 (two) times daily.   enoxaparin 40 MG/0.4ML injection Commonly known as: LOVENOX Inject 0.4 mLs (40 mg total) into the skin daily for 14 days.   escitalopram 20 MG tablet Commonly known  asJudye Bos Take 20 mg by mouth daily.   famotidine 20 MG tablet Commonly known as: PEPCID Take 20 mg by mouth 2 (two) times daily.   ferrous sulfate 325 (65 FE) MG tablet Take 325 mg by mouth every Monday, Wednesday, and Friday.   fluticasone 50 MCG/ACT nasal spray Commonly known as: FLONASE Place 1 spray into both nostrils daily.   folic acid 1 MG tablet Commonly known as: FOLVITE Take 1 mg by mouth daily.   gabapentin 600 MG tablet Commonly known as: NEURONTIN Take 600 mg by mouth 3 (three) times daily.   Gemtesa 75 MG Tabs Generic drug: Vibegron Take by mouth daily.   HYDROcodone-acetaminophen 5-325 MG tablet Commonly known as: NORCO/VICODIN Take 1-2 tablets by mouth every 6 (six) hours as needed for severe pain (pain score 7-10).   levothyroxine 100 MCG tablet Commonly known as: SYNTHROID Take 100 mcg by mouth daily before breakfast.   lidocaine 5 % Commonly known as: LIDODERM Place 1 patch onto the skin daily. Remove & Discard patch within 12 hours or as directed by MD   metoprolol succinate 25 MG 24 hr tablet Commonly known as: TOPROL-XL Take 1 tablet (25 mg total) by mouth daily.   Mintox Maximum Strength 400-400-40 MG/5ML suspension Generic drug: alum & mag hydroxide-simeth Take 10-20 mLs by mouth 3 (three) times daily as needed (reflux or gas).   nystatin cream Commonly known as: MYCOSTATIN Apply 1 Application topically 2 (two) times daily.   ondansetron 4 MG tablet Commonly known as: ZOFRAN Take 1 tablet (4 mg total) by mouth every 6 (six) hours as needed for nausea.   OPTIVE 0.5-0.9 % ophthalmic solution Generic drug:  carboxymethylcellul-glycerin Place 2 drops into both eyes 4 (four) times daily as needed for dry eyes (for irritated eyes).   pantoprazole 20 MG tablet Commonly known as: PROTONIX Take 20 mg by mouth 2 (two) times daily.   PSYLLIUM PO Take 17 g by mouth daily.   QUEtiapine 50 MG tablet Commonly known as: SEROQUEL Take 50 mg by mouth at bedtime.   Symbicort 80-4.5 MCG/ACT inhaler Generic drug: budesonide-formoterol Inhale 2 puffs into the lungs in the morning and at bedtime.   traZODone 100 MG tablet Commonly known as: DESYREL Take 100 mg by mouth at bedtime.   traZODone 50 MG tablet Commonly known as: DESYREL Take 25 mg by mouth at bedtime.   Vitamin D (Ergocalciferol) 1.25 MG (50000 UNIT) Caps capsule Commonly known as: DRISDOL Take 50,000 Units by mouth every Monday.        Diagnostic Studies: DG Knee 1-2 Views Right Result Date: 04/25/2023 CLINICAL DATA:  Intraoperative/postoperative total knee. EXAM: RIGHT KNEE - 1-2 VIEW COMPARISON:  Right knee x-ray 12/13/2022 FINDINGS: There is a new right knee total arthroplasty in anatomic alignment. There is anterior soft tissue swelling and air compatible with recent surgery. There is no evidence for fracture. Peripheral vascular calcifications are present. IMPRESSION: New right knee total arthroplasty in anatomic alignment. Electronically Signed   By: Darliss Cheney M.D.   On: 04/25/2023 19:34    Disposition:      Contact information for follow-up providers     Evon Slack, PA-C Follow up in 2 week(s).   Specialties: Orthopedic Surgery, Emergency Medicine Contact information: [address] [place] [address] [phone]              Contact information for after-discharge care     Destination     [facility] .  Service: Skilled Nursing Contact information: [address] [place] [address] [phone]                      Signed: Patience Musca 04/26/2023, 12:20 PM

## 2023-04-26 NOTE — TOC Transition Note (Signed)
Transition of Care [facility]) - Discharge Note   Patient Details  Name: Shelly Freeman MRN: [id number] Date of Birth: 1951-01-11  Transition of [facility]) CM/SW Contact:  Hetty Ely, RN Phone Number: 04/26/2023, 3:08 PM   Clinical Narrative:   CM spoke with [facility] RN Ginnie about discharge plans. Ginnie informed CM and RN that patient will discharge to [facility] when medically stable. CM called [facility] and spoke with Gavin Pound, patient will be in room 222-B, Nurse to call report [phone]. [facility] will also transport patient to facility by 5 pm. Lelon Mast RN notified.     Final next level of care: Skilled Nursing Facility Barriers to Discharge: Barriers Resolved   Patient Goals and CMS Choice            Discharge Placement              Patient chooses bed at: [facility] Patient to be transferred to facility by: [facility] [place]   Patient and family notified of of transfer: 04/26/23  Discharge Plan and Services Additional resources added to the After Visit Summary for                  DME Arranged: N/A DME Agency: NA       HH Arranged: NA HH Agency: NA        Social Drivers of Health (SDOH) Interventions SDOH Screenings   Food Insecurity: No Food Insecurity (04/25/2023)  Housing: Low Risk  (04/25/2023)  Transportation Needs: No Transportation Needs (04/25/2023)  Utilities: Not At Risk (04/25/2023)  Alcohol Screen: Low Risk  (12/09/2017)  Tobacco Use: Low Risk  (04/25/2023)     Readmission Risk Interventions    01/28/2021   12:19 PM  Readmission Risk Prevention Plan  Transportation Screening Complete  PCP or Specialist Appt within 3-5 Days Complete  HRI or Home Care Consult Complete  Social Work Consult for Recovery Care Planning/Counseling Complete  Palliative Care Screening Not Applicable  Medication Review Oceanographer) Complete

## 2023-04-26 NOTE — Discharge Instructions (Signed)
 Instructions after Total Knee Replacement   Zachary Aberman M.D.     Dept. of Orthopaedics & Sports Medicine  [facility]  [address]  [place], [place]  [address]  Phone: [phone]   Fax: [phone]    DIET: Drink plenty of non-alcoholic fluids. Resume your normal diet. Include foods high in fiber.  ACTIVITY:  You may use crutches or a walker with weight-bearing as tolerated, unless instructed otherwise. You may be weaned off of the walker or crutches by your Physical Therapist.  Do NOT place pillows under the knee. Anything placed under the knee could limit your ability to straighten the knee.   Continue doing gentle exercises. Exercising will reduce the pain and swelling, increase motion, and prevent muscle weakness.   Please continue to use the TED compression stockings for 2 weeks. You may remove the stockings at night, but should reapply them in the morning. Do not drive or operate any equipment until instructed.  WOUND CARE:  Continue to use the PolarCare or ice packs periodically to reduce pain and swelling. You may begin showering 3 days after surgery with honeycomb dressing. Remove honeycomb dressing 7 days after surgery and continue showering. Allow dermabond to fall off on its own.  MEDICATIONS: You may resume your regular medications. Please take the pain medication as prescribed on the medication. Do not take pain medication on an empty stomach. You have been given a prescription for a blood thinner (Lovenox or Coumadin). Please take the medication as instructed. (NOTE: After completing a 2 week course of Lovenox, take one 81 mg Enteric-coated aspirin twice a day for 3 additional weeks. This along with elevation will help reduce the possibility of phlebitis in your operated leg.) Do not drive or drink alcoholic beverages when taking pain medications.  POSTOPERATIVE CONSTIPATION PROTOCOL Constipation - defined medically as fewer than three stools per  week and severe constipation as less than one stool per week.  One of the most common issues patients have following surgery is constipation.  Even if you have a regular bowel pattern at home, your normal regimen is likely to be disrupted due to multiple reasons following surgery.  Combination of anesthesia, postoperative narcotics, change in appetite and fluid intake all can affect your bowels.  In order to avoid complications following surgery, here are some recommendations in order to help you during your recovery period.  Colace (docusate) - Pick up an over-the-counter form of Colace or another stool softener and take twice a day as long as you are requiring postoperative pain medications.  Take with a full glass of water daily.  If you experience loose stools or diarrhea, hold the colace until you stool forms back up.  If your symptoms do not get better within 1 week or if they get worse, check with your doctor.  Dulcolax (bisacodyl) - Pick up over-the-counter and take as directed by the product packaging as needed to assist with the movement of your bowels.  Take with a full glass of water.  Use this product as needed if not relieved by Colace only.   MiraLax (polyethylene glycol) - Pick up over-the-counter to have on hand.  MiraLax is a solution that will increase the amount of water in your bowels to assist with bowel movements.  Take as directed and can mix with a glass of water, juice, soda, coffee, or tea.  Take if you go more than two days without a movement. Do not use MiraLax more than once per day.   Call your doctor if you are still constipated or irregular after using this medication for 7 days in a row.  If you continue to have problems with postoperative constipation, please contact the office for further assistance and recommendations.  If you experience "the worst abdominal pain ever" or develop nausea or vomiting, please contact the office immediatly for further recommendations for  treatment.   CALL THE OFFICE FOR: Temperature above 101 degrees Excessive bleeding or drainage on the dressing. Excessive swelling, coldness, or paleness of the toes. Persistent nausea and vomiting.  FOLLOW-UP:  You should have an appointment to return to the office in 14 days after surgery. Arrangements have been made for continuation of Physical Therapy (either home therapy or outpatient therapy).  

## 2023-04-26 NOTE — Evaluation (Signed)
Physical Therapy Evaluation Patient Details Name: Shelly Freeman MRN: [id number] DOB: [birth year] Today's Date: 04/26/2023  History of Present Illness  Pt is a 72 yo F s/p elective R TKA.  PMH includes COPD, DOE, HTN, and back pain.  Clinical Impression  Pt was pleasant and motivated to participate during the session and put forth good effort throughout. Pt required significantly increased time and effort with all functional tasks but no physical assistance.  Pt was able to perform sit to/form stands with fair eccentric and concentric control and stability and ambulated a max of around 5 feet near the EOB and then from bed to chair before needing to return to sitting secondary to pain and weakness.  Pt reported no adverse symptoms during the session other than R knee pain and was left with all needs in place.  Pt will benefit from continued PT services upon discharge to safely address deficits listed in patient problem list for decreased caregiver assistance and eventual return to PLOF.            If plan is discharge home, recommend the following: A lot of help with walking and/or transfers;A little help with bathing/dressing/bathroom;Assistance with cooking/housework;Assist for transportation   Can travel by private vehicle   No    Equipment Recommendations None recommended by PT  Recommendations for Other Services       Functional Status Assessment Patient has had a recent decline in their functional status and demonstrates the ability to make significant improvements in function in a reasonable and predictable amount of time.     Precautions / Restrictions Precautions Precautions: Fall Restrictions Weight Bearing Restrictions Per Provider Order: Yes RLE Weight Bearing Per Provider Order: Weight bearing as tolerated      Mobility  Bed Mobility Overal bed mobility: Modified Independent             General bed mobility comments: Mod extra time and effort only     Transfers Overall transfer level: Needs assistance Equipment used: Rolling walker (2 wheels) Transfers: Sit to/from Stand Sit to Stand: Contact guard assist           General transfer comment: Mod verbal and visual cues for sequencing    Ambulation/Gait Ambulation/Gait assistance: Contact guard assist Gait Distance (Feet): 5 Feet Assistive device: Rolling walker (2 wheels) Gait Pattern/deviations: Step-to pattern, Decreased step length - left, Decreased stance time - right, Antalgic Gait velocity: decreased     General Gait Details: Mod verbal cues for sequencing with very slow, effortful steps utilizing step-to pattern for pain control  Stairs            Wheelchair Mobility     Tilt Bed    Modified Rankin (Stroke Patients Only)       Balance Overall balance assessment: Needs assistance, History of Falls   Sitting balance-Leahy Scale: Normal     Standing balance support: Bilateral upper extremity supported, During functional activity, Reliant on assistive device for balance Standing balance-Leahy Scale: Fair                               Pertinent Vitals/Pain Pain Assessment Pain Assessment: 0-10 Pain Score: 6  Pain Location: R knee Pain Descriptors / Indicators: Sore, Aching Pain Intervention(s): Premedicated before session, Repositioned, Monitored during session, Ice applied    Home Living Family/patient expects to be discharged to:: Private residence Living Arrangements: Alone Available Help at Discharge: Family;Available PRN/intermittently Type of Home: Apartment Home  Access: Level entry       Home Layout: One level Home Equipment: Agricultural consultant (2 wheels);Rollator (4 wheels);BSC/3in1 Additional Comments: [facility] M-F from 9-3pm    Prior Function Prior Level of Function : Independent/Modified Independent;History of Falls (last six months)             Mobility Comments: Mod Ind amb with a RW in the home and a rollator in  the community, one fall in the last 6 months secondary to LOB ADLs Comments: Ind with ADLs     Extremity/Trunk Assessment   Upper Extremity Assessment Upper Extremity Assessment: Overall WFL for tasks assessed    Lower Extremity Assessment Lower Extremity Assessment: Generalized weakness;RLE deficits/detail RLE: Unable to fully assess due to pain RLE Sensation: WNL RLE Coordination: WNL       Communication   Communication Communication: No apparent difficulties;Other (comment) (In house interpreter Kandis Cocking utilized during the session) Cueing Techniques: Verbal cues;Tactile cues;Visual cues  Cognition Arousal: Alert Behavior During Therapy: WFL for tasks assessed/performed                                            General Comments      Exercises Total Joint Exercises Quad Sets: AROM, Strengthening, Right, 5 reps, 10 reps Long Arc Quad: AROM, Strengthening, Right, 5 reps, 10 reps Knee Flexion: AROM, Strengthening, Right, 5 reps, 10 reps Goniometric ROM: R knee AROM: 11-89 deg Marching in Standing: AROM, Strengthening, Both, 10 reps, Standing Other Exercises Other Exercises: HEP education per handout Other Exercises: Positioning education to promote R knee ext PROM   Assessment/Plan    PT Assessment Patient needs continued PT services  PT Problem List Decreased strength;Decreased range of motion;Decreased balance;Decreased mobility;Decreased knowledge of use of DME;Pain       PT Treatment Interventions DME instruction;Gait training;Functional mobility training;Therapeutic activities;Therapeutic exercise;Balance training;Patient/family education    PT Goals (Current goals can be found in the Care Plan section)  Acute Rehab PT Goals Patient Stated Goal: To walk without pain PT Goal Formulation: With patient Time For Goal Achievement: 05/09/23 Potential to Achieve Goals: Good    Frequency BID     Co-evaluation               AM-PAC PT  "6 Clicks" Mobility  Outcome Measure Help needed turning from your back to your side while in a flat bed without using bedrails?: A Little Help needed moving from lying on your back to sitting on the side of a flat bed without using bedrails?: A Little Help needed moving to and from a bed to a chair (including a wheelchair)?: A Little Help needed standing up from a chair using your arms (e.g., wheelchair or bedside chair)?: A Little Help needed to walk in hospital room?: A Lot Help needed climbing 3-5 steps with a railing? : Total 6 Click Score: 15    End of Session Equipment Utilized During Treatment: Gait belt Activity Tolerance: Patient tolerated treatment well Patient left: in chair;with call bell/phone within reach;with SCD's reapplied;Other (comment) (polar care donned to R knee) Nurse Communication: Mobility status;Weight bearing status PT Visit Diagnosis: History of falling (Z91.81);Other abnormalities of gait and mobility (R26.89);Muscle weakness (generalized) (M62.81);Pain Pain - Right/Left: Right Pain - part of body: Knee    Time: 0923-0958 PT Time Calculation (min) (ACUTE ONLY): 35 min   Charges:   PT Evaluation $PT Eval Moderate Complexity:  1 Mod PT Treatments $Therapeutic Exercise: 8-22 mins PT General Charges $$ ACUTE PT VISIT: 1 Visit       D. Scott Saba Neuman PT, DPT 04/26/23, 11:20 AM

## 2023-04-26 NOTE — Progress Notes (Signed)
Subjective: 1 Day Post-Op Procedure(s) (LRB): TOTAL KNEE ARTHROPLASTY (Right) Patient reports pain as moderate.   Patient is well, and has had no acute complaints or problems Denies any CP, SOB, ABD pain. We will continue therapy today.  Plan is to go Skilled nursing facility after hospital stay.  Objective: Vital signs in last 24 hours: Temp:  [97.4 F (36.3 C)-98.4 F (36.9 C)] 98.4 F (36.9 C) (12/20 0613) Pulse Rate:  [64-80] 66 (12/20 0613) Resp:  [10-25] 16 (12/20 0613) BP: (116-148)/(48-98) 116/51 (12/20 0613) SpO2:  [89 %-99 %] 94 % (12/20 1610) Weight:  [100.2 kg] 100.2 kg (12/19 1131)  Intake/Output from previous day: 12/19 0701 - 12/20 0700 In: 700 [I.V.:700] Out: 100 [Blood:100] Intake/Output this shift: No intake/output data recorded.  Recent Labs    04/26/23 0629  HGB 11.1*   Recent Labs    04/26/23 0629  WBC 7.6  RBC 3.61*  HCT 35.4*  PLT 156   Recent Labs    04/26/23 0629  NA 140  K 4.8  CL 106  CO2 24  BUN 22  CREATININE 0.92  GLUCOSE 122*  CALCIUM 8.5*   No results for input(s): "LABPT", "INR" in the last 72 hours.  EXAM General - Patient is Alert, Appropriate, and Oriented Extremity - Neurovascular intact Sensation intact distally Intact pulses distally Dorsiflexion/Plantar flexion intact Dressing - dressing C/D/I and no drainage Motor Function - intact, moving foot and toes well on exam.   Past Medical History:  Diagnosis Date   Acute respiratory failure with hypoxia [facility])    Age related osteoporosis    Angina at rest [facility])    Anginal pain (HCC)    Arthritis    Asthma    CAD (coronary artery disease)    a.) PCI/stent (vessel/type unknown) in approx 2017-2018 [facility]); b.) MV 05/28/2016: no ischemia; c.) cardiac PET CT 03/18/2023: no ischemia   Cerebral microvascular disease    Cervical disc disorder with radiculopathy    Chronic pain syndrome    a.) on buprenorphine   COPD (chronic obstructive pulmonary disease)  (HCC)    DDD (degenerative disc disease), lumbar 05/14/2016   Depression    Diastolic dysfunction    a.) TTE 01/22/2018L EF >55%, no RWMAs, G1DD, norm RVSF, triv AR/PR, mild MR/TR; b.) TTE 08/06/2019: EF >55%, no RWMAs, mild LVH, norm RVSF, mild MR/TR, RVSP 30.5; c.) TTE 12/19/2022: EF >55%, no RWMAs, norm RVSF, PASP 37   Dizziness    Dyspnea    GERD (gastroesophageal reflux disease)    Gram-negative bacteremia (E.coli) 01/26/2021   Heart valve problem    per grandaughter need surgery but they told her may not survive;   Hepatomegaly    Hiatal hernia    a.) s/p Nissen fundoplication; slipped (failed) fundoplication with recurrent hiatal hernia   History of 2019 novel coronavirus disease (COVID-19)    History of bilateral cataract extraction 11/2021   History of infection due to ESBL Escherichia coli    History of kidney stones    History of Roux-en-Y gastric bypass    HLD (hyperlipidemia)    Hypertension    Hypothyroidism    IDA (iron deficiency anemia)    Insomnia    a.) taking trazodone PRN   Intentional opioid overdose (HCC)    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)    Murmur    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549 m).   Weight as of this encounter: 100.2 kg. Advance diet Up with therapy Pain controlled VSS Labs are stable CM to assist with  discharge. Patient lives at home alone with no assistance. Will likely need SNF  DVT Prophylaxis - Lovenox, TED hose, and SCDs Weight-Bearing as tolerated to right leg   T. Cranston Neighbor, PA-C [facility] 04/26/2023, 8:17 AM

## 2023-04-26 NOTE — Progress Notes (Signed)
Nsg Discharge Note  Admit Date:  04/25/2023 Discharge date: 04/26/2023   Shelly Freeman to be D/C'd Skilled nursing facility per MD order.  AVS completed.  Report called to [facility]. Patient/caregiver able to verbalize understanding.  Discharge Medication: Allergies as of 04/26/2023       Reactions   Cholestyramine Other (See Comments)   severe        Medication List     STOP taking these medications    acetaminophen 650 MG CR tablet Commonly known as: TYLENOL Replaced by: acetaminophen 500 MG tablet       TAKE these medications    acetaminophen 500 MG tablet Commonly known as: TYLENOL Take 2 tablets (1,000 mg total) by mouth every 8 (eight) hours. Replaces: acetaminophen 650 MG CR tablet   albuterol 108 (90 Base) MCG/ACT inhaler Commonly known as: VENTOLIN HFA Inhale into the lungs. Inhale 2 puffs into the lungs every 4 (four) hours as needed for wheezing or shortness of breath.   alendronate 70 MG tablet Commonly known as: FOSAMAX Take 70 mg by mouth every Monday. Take with a full glass of water on an empty stomach.   atorvastatin 40 MG tablet Commonly known as: LIPITOR Take 40 mg by mouth at bedtime.   benzonatate 200 MG capsule Commonly known as: TESSALON Take 200 mg by mouth 3 (three) times daily as needed for cough.   buprenorphine 7.5 MCG/HR Commonly known as: BUTRANS Place 1 patch onto the skin once a week. (Apply one patch to upper back/shoulder, outer arm, or upper chest alternating sites weekly on Wednesday at [facility]. May reuse site every 21 days.)   busPIRone 15 MG tablet Commonly known as: BUSPAR Take 15 mg by mouth 2 (two) times daily.   celecoxib 100 MG capsule Commonly known as: CELEBREX Take 100 mg by mouth 2 (two) times daily.   cholecalciferol 25 MCG (1000 UNIT) tablet Commonly known as: VITAMIN D3 Take 1,000 Units by mouth daily.   cyanocobalamin 1000 MCG tablet Commonly known as: VITAMIN B12 Take 1,000 mcg by mouth  daily.   Dextromethorphan-guaiFENesin 10-100 MG/5ML liquid Take 10 mLs by mouth 3 (three) times daily as needed (for cough and nasal congestion).   diclofenac Sodium 1 % Gel Commonly known as: VOLTAREN Apply 4 g topically 4 (four) times daily. For pain in the right knee. Rub into knee for 1 minute for each application.   docusate sodium 100 MG capsule Commonly known as: COLACE Take 1 capsule (100 mg total) by mouth 2 (two) times daily.   enoxaparin 40 MG/0.4ML injection Commonly known as: LOVENOX Inject 0.4 mLs (40 mg total) into the skin daily for 14 days.   escitalopram 20 MG tablet Commonly known as: LEXAPRO Take 20 mg by mouth daily.   famotidine 20 MG tablet Commonly known as: PEPCID Take 20 mg by mouth 2 (two) times daily.   ferrous sulfate 325 (65 FE) MG tablet Take 325 mg by mouth every Monday, Wednesday, and Friday.   fluticasone 50 MCG/ACT nasal spray Commonly known as: FLONASE Place 1 spray into both nostrils daily.   folic acid 1 MG tablet Commonly known as: FOLVITE Take 1 mg by mouth daily.   gabapentin 600 MG tablet Commonly known as: NEURONTIN Take 600 mg by mouth 3 (three) times daily.   Gemtesa 75 MG Tabs Generic drug: Vibegron Take by mouth daily.   HYDROcodone-acetaminophen 5-325 MG tablet Commonly known as: NORCO/VICODIN Take 1-2 tablets by mouth every 6 (six) hours as needed for  severe pain (pain score 7-10).   levothyroxine 100 MCG tablet Commonly known as: SYNTHROID Take 100 mcg by mouth daily before breakfast.   lidocaine 5 % Commonly known as: LIDODERM Place 1 patch onto the skin daily. Remove & Discard patch within 12 hours or as directed by MD   metoprolol succinate 25 MG 24 hr tablet Commonly known as: TOPROL-XL Take 1 tablet (25 mg total) by mouth daily.   Mintox Maximum Strength 400-400-40 MG/5ML suspension Generic drug: alum & mag hydroxide-simeth Take 10-20 mLs by mouth 3 (three) times daily as needed (reflux or gas).    nystatin cream Commonly known as: MYCOSTATIN Apply 1 Application topically 2 (two) times daily.   ondansetron 4 MG tablet Commonly known as: ZOFRAN Take 1 tablet (4 mg total) by mouth every 6 (six) hours as needed for nausea.   OPTIVE 0.5-0.9 % ophthalmic solution Generic drug: carboxymethylcellul-glycerin Place 2 drops into both eyes 4 (four) times daily as needed for dry eyes (for irritated eyes).   pantoprazole 20 MG tablet Commonly known as: PROTONIX Take 20 mg by mouth 2 (two) times daily.   PSYLLIUM PO Take 17 g by mouth daily.   QUEtiapine 50 MG tablet Commonly known as: SEROQUEL Take 50 mg by mouth at bedtime.   Symbicort 80-4.5 MCG/ACT inhaler Generic drug: budesonide-formoterol Inhale 2 puffs into the lungs in the morning and at bedtime.   traZODone 100 MG tablet Commonly known as: DESYREL Take 100 mg by mouth at bedtime.   traZODone 50 MG tablet Commonly known as: DESYREL Take 25 mg by mouth at bedtime.   Vitamin D (Ergocalciferol) 1.25 MG (50000 UNIT) Caps capsule Commonly known as: DRISDOL Take 50,000 Units by mouth every Monday.        Discharge Assessment: Vitals:   04/26/23 0907 04/26/23 1438  BP: 107/63 (!) 106/54  Pulse: 72 73  Resp: 17 18  Temp: (!) 97 F (36.1 C) 97.6 F (36.4 C)  SpO2: 94% 97%   Skin clean, dry and intact without evidence of skin break down, no evidence of skin tears noted. IV catheter discontinued intact. Site without signs and symptoms of complications - no redness or edema noted at insertion site, patient denies c/o pain - only slight tenderness at site.  Dressing with slight pressure applied.  D/c Instructions-Education: Discharge instructions given to patient/family with verbalized understanding. D/c education completed with patient/family including follow up instructions, medication list, d/c activities limitations if indicated, with other d/c instructions as indicated by MD - patient able to verbalize  understanding, all questions fully answered. Patient instructed to return to ED, call 911, or call MD for any changes in condition.  Patient escorted via [facility], and transported via [facility] transportation [place].  Belongings and discharge paperwork given to [facility] driver.   Theodore Demark, RN 04/26/2023 3:33 PM

## 2023-04-26 NOTE — [place] FL2 (Signed)
[place] MEDICAID FL2 LEVEL OF CARE FORM     IDENTIFICATION  Patient Name: Shelly Freeman Birthdate: Sep 05, 1950 Sex: female Admission Date (Current Location): 04/25/2023  [facility] and [place] Number:  Chiropodist and Address:  [facility], [address] [place], [place] [address]      Provider Number: [id number]  Attending Physician Name and Address:  Reinaldo Berber, MD  Relative Name and Phone Number:  Evans, Cota (Granddaughter)  [phone]    Current Level of Care: Hospital Recommended Level of Care:   Prior Approval Number:    Date Approved/Denied:   PASRR Number: [id number]  Discharge Plan: SNF    Current Diagnoses: Patient Active Problem List   Diagnosis Date Noted   S/P TKR (total knee replacement) using cement, right 04/25/2023   Chronic pain syndrome 02/13/2023   Lumbar radicular pain, acute on chronic, intractable 02/10/2023   History of nephrolithiasis 02/10/2023   Hypotension due to medication 02/10/2023   Intractable pain 02/10/2023   Sleep apnea    Cystitis 09/13/2022   History of ESBL E. 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   Bilateral pneumonia 08/29/2020   AKI (acute kidney injury) (HCC) 08/29/2020   COPD (chronic obstructive pulmonary disease) (HCC) 08/29/2020   Fall at home, initial encounter 08/29/2020   CAP (community acquired pneumonia) 08/29/2020   Osteoarthritis of left knee  10/01/2019   Diarrhea 09/17/2019   Dizziness 09/17/2019   Other fatigue 06/09/2019   Shortness of breath 06/09/2019   Obesity, Class III, BMI 40-49.9 (morbid obesity) (HCC) 05/23/2019   UTI (urinary tract infection) 05/23/2019   Right rib fracture 05/23/2019   SIRS (systemic inflammatory response syndrome) (HCC) 05/23/2019   Bacteremia due to Gram-negative bacteria 05/23/2019   Chest pain at rest 05/19/2019   Pain in rib 05/19/2019   Acute bronchiolitis 01/28/2019   Iron deficiency anemia due to chronic blood loss 10/29/2018   Primary osteoarthritis involving multiple joints 10/29/2018   Right carpal tunnel syndrome 07/31/2017   Cervical radiculopathy 07/16/2017   Left anterior knee pain 10/24/2016   Right upper quadrant pain 10/24/2016   Urination pain 06/26/2016   DDD (degenerative disc disease), cervical 05/14/2016   Lipoma of skin 05/14/2016   Vitamin D deficiency 05/14/2016   Major neurocognitive disorder, due to vascular disease, with behavioral disturbance, mild (HCC) 04/15/2016   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/04/2016   Lupus 04/04/2016   GERD (gastroesophageal reflux disease) 04/04/2016   COPD with acute bronchitis (HCC) 04/04/2016   Acute respiratory failure with hypoxia (HCC) 04/03/2016   Encephalopathy, metabolic 04/03/2016   Hypokalemia 04/03/2016   Pain in limb 04/03/2016   Hypothyroidism 04/03/2016   Essential hypertension 04/03/2016   Intentional opiate overdose (HCC) 03/30/2016   Age related osteoporosis 03/19/2016   Right arm pain 03/19/2016   Encounter for long-term (current) use of high-risk medication 03/19/2016   Primary osteoarthritis of both knees 03/19/2016    Orientation RESPIRATION BLADDER Height & Weight     Self, Time, Situation  Normal Incontinent Weight: 100.2 kg Height:  5\' 1"  (154.9 cm)  BEHAVIORAL SYMPTOMS/MOOD NEUROLOGICAL BOWEL NUTRITION STATUS        Diet  AMBULATORY STATUS COMMUNICATION OF NEEDS  Skin   Extensive Assist Verbally (needs interpreter) Surgical wounds                       Personal Care Assistance Level of Assistance  Bathing, Feeding, Dressing Bathing Assistance: Maximum assistance Feeding assistance: Limited assistance Dressing Assistance: Maximum assistance     Functional Limitations Info  Sight, Hearing, Speech Sight Info: Adequate Hearing Info: Adequate Speech Info: Adequate    SPECIAL CARE FACTORS FREQUENCY  PT (By licensed PT), OT (By licensed OT)     PT Frequency: 5x week OT Frequency: 5x week            Contractures Contractures Info: Not present    Additional Factors Info                  Current Medications (04/26/2023):  This is the current hospital active medication list Current Facility-Administered Medications  Medication Dose Route Frequency Provider Last Rate Last Admin   0.9 %  sodium chloride infusion   Intravenous Continuous Reinaldo Berber, MD       acetaminophen (TYLENOL) tablet 1,000 mg  1,000 mg Oral Q8H Reinaldo Berber, MD   1,000 mg at 04/26/23 9629   acetaminophen (TYLENOL) tablet 325-650 mg  325-650 mg Oral Q6H PRN Reinaldo Berber, MD       albuterol (PROVENTIL) (2.5 MG/3ML) 0.083% nebulizer solution 2.5 mg  2.5 mg Inhalation Q4H PRN Reinaldo Berber, MD       atorvastatin (LIPITOR) tablet 40 mg  40 mg Oral QHS Reinaldo Berber, MD   40 mg at 04/25/23 2101   busPIRone (BUSPAR) tablet 15 mg  15 mg Oral BID Reinaldo Berber, MD   15 mg at 04/26/23 0905   docusate sodium (COLACE) capsule 100 mg  100 mg Oral BID Reinaldo Berber, MD   100 mg at 04/26/23 0904   enoxaparin (LOVENOX) injection 30 mg  30 mg Subcutaneous Q12H Reinaldo Berber, MD   30 mg at 04/26/23 0743   escitalopram (LEXAPRO) tablet 20 mg  20 mg Oral Daily Reinaldo Berber, MD   20 mg at 04/26/23 5284   gabapentin (NEURONTIN) capsule 600 mg  600 mg Oral TID Reinaldo Berber, MD   600 mg at 04/26/23 1324   HYDROcodone-acetaminophen (NORCO/VICODIN)  5-325 MG per tablet 1-2 tablet  1-2 tablet Oral Q4H PRN Reinaldo Berber, MD   2 tablet at 04/26/23 1425   levothyroxine (SYNTHROID) tablet 100 mcg  100 mcg Oral QAC breakfast Reinaldo Berber, MD   100 mcg at 04/26/23 4010   menthol-cetylpyridinium (CEPACOL) lozenge 3 mg  1 lozenge Oral PRN Reinaldo Berber, MD       Or   phenol (CHLORASEPTIC) mouth spray 1 spray  1 spray Mouth/Throat PRN Reinaldo Berber, MD       metoCLOPramide (REGLAN) tablet 5-10 mg  5-10 mg Oral Q8H PRN Reinaldo Berber, MD       Or   metoCLOPramide (REGLAN) injection 5-10 mg  5-10 mg Intravenous Q8H PRN Reinaldo Berber, MD       metoprolol succinate (TOPROL-XL) 24 hr tablet 25 mg  25 mg Oral Daily Reinaldo Berber, MD   25 mg at 04/26/23 0904   mirabegron ER (MYRBETRIQ) tablet 25 mg  25 mg Oral Daily Reinaldo Berber, MD   25 mg at 04/26/23 [phone]  morphine (PF) 4 MG/ML injection 0.52-1 mg  0.52-1 mg Intravenous Q2H PRN Reinaldo Berber, MD   1 mg at 04/26/23 1019   ondansetron (ZOFRAN) tablet 4 mg  4 mg Oral Q6H PRN Reinaldo Berber, MD       Or   ondansetron (ZOFRAN) injection 4 mg  4 mg Intravenous Q6H PRN Reinaldo Berber, MD       Oral care mouth rinse  15 mL Mouth Rinse PRN Reinaldo Berber, MD       pantoprazole (PROTONIX) EC tablet 40 mg  40 mg Oral Daily Reinaldo Berber, MD   40 mg at 04/26/23 0904   QUEtiapine (SEROQUEL) tablet 50 mg  50 mg Oral QHS Reinaldo Berber, MD   50 mg at 04/25/23 2101   traMADol (ULTRAM) tablet 50 mg  50 mg Oral Q6H PRN Reinaldo Berber, MD   50 mg at 04/26/23 6962   traZODone (DESYREL) tablet 100 mg  100 mg Oral QHS Reinaldo Berber, MD   100 mg at 04/25/23 2100   traZODone (DESYREL) tablet 25 mg  25 mg Oral QHS Reinaldo Berber, MD   25 mg at 04/25/23 2100     Discharge Medications: Please see discharge summary for a list of discharge medications.  Relevant Imaging Results:  Relevant Lab Results:   Additional Information Rt Total Knee Replacement/ SS#  [id number]  Hetty Ely, RN

## 2023-05-09 ENCOUNTER — Emergency Department: Payer: Medicare (Managed Care)

## 2023-05-09 ENCOUNTER — Other Ambulatory Visit: Payer: Self-pay

## 2023-05-09 ENCOUNTER — Encounter: Payer: Self-pay | Admitting: Emergency Medicine

## 2023-05-09 DIAGNOSIS — I2510 Atherosclerotic heart disease of native coronary artery without angina pectoris: Secondary | ICD-10-CM | POA: Diagnosis not present

## 2023-05-09 DIAGNOSIS — R0789 Other chest pain: Secondary | ICD-10-CM | POA: Diagnosis not present

## 2023-05-09 DIAGNOSIS — Z955 Presence of coronary angioplasty implant and graft: Secondary | ICD-10-CM | POA: Diagnosis not present

## 2023-05-09 DIAGNOSIS — M25561 Pain in right knee: Secondary | ICD-10-CM | POA: Diagnosis present

## 2023-05-09 LAB — CBC WITH DIFFERENTIAL/PLATELET
Abs Immature Granulocytes: 0.03 10*3/uL (ref 0.00–0.07)
Basophils Absolute: 0.1 10*3/uL (ref 0.0–0.1)
Basophils Relative: 1 %
Eosinophils Absolute: 0.1 10*3/uL (ref 0.0–0.5)
Eosinophils Relative: 2 %
HCT: 34.6 % — ABNORMAL LOW (ref 36.0–46.0)
Hemoglobin: 10.9 g/dL — ABNORMAL LOW (ref 12.0–15.0)
Immature Granulocytes: 0 %
Lymphocytes Relative: 31 %
Lymphs Abs: 2.6 10*3/uL (ref 0.7–4.0)
MCH: 30.2 pg (ref 26.0–34.0)
MCHC: 31.5 g/dL (ref 30.0–36.0)
MCV: 95.8 fL (ref 80.0–100.0)
Monocytes Absolute: 0.5 10*3/uL (ref 0.1–1.0)
Monocytes Relative: 6 %
Neutro Abs: 5 10*3/uL (ref 1.7–7.7)
Neutrophils Relative %: 60 %
Platelets: 377 10*3/uL (ref 150–400)
RBC: 3.61 MIL/uL — ABNORMAL LOW (ref 3.87–5.11)
RDW: 13.8 % (ref 11.5–15.5)
WBC: 8.4 10*3/uL (ref 4.0–10.5)
nRBC: 0 % (ref 0.0–0.2)

## 2023-05-09 LAB — COMPREHENSIVE METABOLIC PANEL
ALT: 13 U/L (ref 0–44)
AST: 17 U/L (ref 15–41)
Albumin: 2.6 g/dL — ABNORMAL LOW (ref 3.5–5.0)
Alkaline Phosphatase: 67 U/L (ref 38–126)
Anion gap: 10 (ref 5–15)
BUN: 12 mg/dL (ref 8–23)
CO2: 14 mmol/L — ABNORMAL LOW (ref 22–32)
Calcium: 7.4 mg/dL — ABNORMAL LOW (ref 8.9–10.3)
Chloride: 114 mmol/L — ABNORMAL HIGH (ref 98–111)
Creatinine, Ser: 0.64 mg/dL (ref 0.44–1.00)
GFR, Estimated: >60 mL/min (ref >60)
Glucose, Bld: 85 mg/dL (ref 70–99)
Potassium: 3 mmol/L — ABNORMAL LOW (ref 3.5–5.1)
Sodium: 138 mmol/L (ref 135–145)
Total Bilirubin: 0.5 mg/dL (ref 0.0–1.2)
Total Protein: 5 g/dL — ABNORMAL LOW (ref 6.5–8.1)

## 2023-05-09 LAB — TROPONIN I (HIGH SENSITIVITY): Troponin I (High Sensitivity): 3 ng/L

## 2023-05-09 LAB — LACTIC ACID, PLASMA: Lactic Acid, Venous: 1.1 mmol/L (ref 0.5–1.9)

## 2023-05-09 MED ORDER — OXYCODONE-ACETAMINOPHEN 5-325 MG PO TABS
1.0000 | ORAL_TABLET | ORAL | Status: DC | PRN
  Administered 2023-05-09: 1 via ORAL
  Filled 2023-05-09: qty 1

## 2023-05-09 NOTE — ED Triage Notes (Signed)
 Pt BIB [facility] from [facility] d/t concern for acute chest pain and worsening right knee pain.  Pt sts she was laying down in bed when she had sudden centralized chest pain around 4:30 pm that is described as sharp pressure and has been persistent ever since. Chest pain radiated to the left chest, left jaw, and back. Is associated with SOB, nausea, and dizziness. Note recent surgery knee surgery and currently on bed rest. Pt also reports worsening pain to right knee following a total knee arthroplasty 12/19. Knee swollen and red to touch. Sts chills without fevers. Has been getting tylenol  without pain relief.   Pt requires spanish speaking interpreter.

## 2023-05-09 NOTE — ED Triage Notes (Signed)
 EMS brings pt in from [facility] for c/o CP since 430pm

## 2023-05-09 NOTE — ED Notes (Signed)
 Removed buprenorphine transdermal patch 7.5 mcg from right upper chest dated 12/27

## 2023-05-10 ENCOUNTER — Emergency Department
Admission: EM | Admit: 2023-05-10 | Discharge: 2023-05-10 | Disposition: A | Discharge status: Home / Self Care | Payer: Medicare (Managed Care) | Attending: Emergency Medicine | Admitting: Emergency Medicine

## 2023-05-10 ENCOUNTER — Emergency Department: Payer: Medicare (Managed Care)

## 2023-05-10 DIAGNOSIS — R0789 Other chest pain: Secondary | ICD-10-CM

## 2023-05-10 DIAGNOSIS — M25561 Pain in right knee: Secondary | ICD-10-CM

## 2023-05-10 LAB — MAGNESIUM: Magnesium: 2 mg/dL (ref 1.7–2.4)

## 2023-05-10 LAB — TROPONIN I (HIGH SENSITIVITY): Troponin I (High Sensitivity): 4 ng/L (ref <18)

## 2023-05-10 LAB — LACTIC ACID, PLASMA: Lactic Acid, Venous: 0.9 mmol/L (ref 0.5–1.9)

## 2023-05-10 MED ORDER — KETAMINE HCL 50 MG/5ML IJ SOSY
20.0000 mg | PREFILLED_SYRINGE | Freq: Once | INTRAMUSCULAR | Status: AC
  Administered 2023-05-10: 20 mg via INTRAVENOUS
  Filled 2023-05-10: qty 5

## 2023-05-10 MED ORDER — KETOROLAC TROMETHAMINE 30 MG/ML IJ SOLN
15.0000 mg | Freq: Once | INTRAMUSCULAR | Status: AC
  Administered 2023-05-10: 15 mg via INTRAVENOUS
  Filled 2023-05-10: qty 1

## 2023-05-10 MED ORDER — POTASSIUM CHLORIDE CRYS ER 20 MEQ PO TBCR
40.0000 meq | EXTENDED_RELEASE_TABLET | Freq: Once | ORAL | Status: AC
Start: 2023-05-10 — End: 2023-05-10
  Administered 2023-05-10: 40 meq via ORAL
  Filled 2023-05-10: qty 2

## 2023-05-10 MED ORDER — FENTANYL CITRATE PF 50 MCG/ML IJ SOSY
50.0000 ug | PREFILLED_SYRINGE | Freq: Once | INTRAMUSCULAR | Status: AC
  Administered 2023-05-10: 50 ug via INTRAVENOUS
  Filled 2023-05-10: qty 1

## 2023-05-10 MED ORDER — IOHEXOL 350 MG/ML SOLN
100.0000 mL | Freq: Once | INTRAVENOUS | Status: AC | PRN
  Administered 2023-05-10: 100 mL via INTRAVENOUS

## 2023-05-10 NOTE — ED Notes (Signed)
 Pt pushed to hallway restroom near room 16. Pt used a walker to walk to toilet from door. This NT helped pt to toilet and back safely to stretcher. Pt placed back in 12h wheels locked and bed in lowest position.

## 2023-05-10 NOTE — ED Provider Notes (Signed)
 [facility] Provider Note    Event Date/Time   First MD Initiated Contact with Patient 05/10/23 0105     (approximate)   History   Chest Pain and Knee Pain (R)   HPI  Shelly Freeman is a 73 y.o. female who presents to the ED for evaluation of Chest Pain and Knee Pain (R)   I review a cardiology clinic visit from 12/13.  CAD with stent placement 6 years ago in [place] .  HLD.  SLE.  Chronic chest pain. 12/19 outpatient right TKA Chronic pain syndrome on buprenorphine , gabapentin , Celebrex   Patient presents for evaluation of right knee pain since her TKA procedure as well as acute chest pain over the past 12 hours now.  Due to prolonged wait times associated with admission holds, she waits nearly 6 hours prior to my evaluation.  Since about 4 PM yesterday she has had centralized chest pain that has been nonradiating, persistent without shortness of breath, dizziness, trauma.  Reports compliance with her pain regimen at her facility.  No falls onto her right knee.  No fevers.  Physical Exam   Triage Vital Signs: ED Triage Vitals  Encounter Vitals Group     BP 05/09/23 2039 (!) 123/38     Systolic BP Percentile --      Diastolic BP Percentile --      Pulse Rate 05/09/23 2039 67     Resp 05/09/23 2039 17     Temp 05/09/23 2039 97.9 F (36.6 C)     Temp Source 05/10/23 0054 Oral     SpO2 05/09/23 2039 99 %     Weight 05/09/23 2047 221 lb (100.2 kg)     Height 05/09/23 2047 5' 1 (1.549 m)     Head Circumference --      Peak Flow --      Pain Score 05/09/23 2046 7     Pain Loc --      Pain Education --      Exclude from Growth Chart --     Most recent vital signs: Vitals:   05/10/23 0054 05/10/23 0130  BP: (!) 158/44 125/67  Pulse: 71 72  Resp: 20 18  Temp: 98 F (36.7 C) 97.6 F (36.4 C)  SpO2: 100% 99%    General: Awake, no distress.  CV:  Good peripheral perfusion.  Resp:  Normal effort.  Abd:  No distention.  MSK:  No  deformity noted.  Remove a clean bandage from the right knee with intact incision line without dehiscence or superimposed infectious features. Neuro:  No focal deficits appreciated. Other:     ED Results / Procedures / Treatments   Labs (all labs ordered are listed, but only abnormal results are displayed) Labs Reviewed  COMPREHENSIVE METABOLIC PANEL - Abnormal; Notable for the following components:      Result Value   Potassium 3.0 (*)    Chloride 114 (*)    CO2 14 (*)    Calcium  7.4 (*)    Total Protein 5.0 (*)    Albumin 2.6 (*)    All other components within normal limits  CBC WITH DIFFERENTIAL/PLATELET - Abnormal; Notable for the following components:   RBC 3.61 (*)    Hemoglobin 10.9 (*)    HCT 34.6 (*)    All other components within normal limits  LACTIC ACID, PLASMA  LACTIC ACID, PLASMA  MAGNESIUM   URINALYSIS, W/ REFLEX TO CULTURE (INFECTION SUSPECTED)  TROPONIN I (HIGH SENSITIVITY)  TROPONIN I (HIGH  SENSITIVITY)    EKG Poor quality EKG.  Sinus rhythm with a rate of 66 bpm.  Normal axis.  QTc 532.  Nonspecific changes without STEMI biphasic T waves laterally and inferiorly.  RADIOLOGY Plain film of the right knee interpreted by me with TKA CXR interpreted by me without evidence of acute cardiopulmonary pathology. CTA chest interpreted by me without signs of PE  Official radiology report(s): CT Angio Chest PE W and/or Wo Contrast Result Date: 05/10/2023 CLINICAL DATA:  Pulmonary embolism (PE) suspected, high prob. Chest pain EXAM: CT ANGIOGRAPHY CHEST WITH CONTRAST TECHNIQUE: Multidetector CT imaging of the chest was performed using the standard protocol during bolus administration of intravenous contrast. Multiplanar CT image reconstructions and MIPs were obtained to evaluate the vascular anatomy. RADIATION DOSE REDUCTION: This exam was performed according to the departmental dose-optimization program which includes automated exposure control, adjustment of the mA  and/or kV according to patient size and/or use of iterative reconstruction technique. CONTRAST:  OMNIPAQUE  IOHEXOL  350 MG/ML SOLN COMPARISON:  Chest x-ray 05/09/2023, CT angio chest 09/13/2022, CT angio chest 08/29/2020, CT chest 05/21/2019 FINDINGS: Cardiovascular: Satisfactory opacification of the pulmonary arteries to the segmental level. No evidence of pulmonary embolism. The main pulmonary artery is normal in caliber. Normal heart size. No significant pericardial effusion. The thoracic aorta is normal in caliber. No atherosclerotic plaque of the thoracic aorta. No coronary artery calcifications. Mediastinum/Nodes: No enlarged mediastinal, hilar, or axillary lymph nodes. Thyroid gland, trachea, and esophagus demonstrate no significant findings. Small to moderate volume hiatal hernia. Lungs/Pleura: Mild peripheral thickening with slight mosaic attenuation of the lungs. No focal consolidation. Chronic stable left upper lobe pulmonary micronodule (6:79) -no further follow-up indicated. No pulmonary mass. No pleural effusion. No pneumothorax. Upper Abdomen: Right my gastric bypass surgical changes Musculoskeletal: No chest wall abnormality. No suspicious lytic or blastic osseous lesions. No acute displaced fracture. Old healed sternal fracture. Review of the MIP images confirms the above findings. IMPRESSION: 1. No pulmonary embolus. 2. Mild small airway disease. 3. Small to moderate volume hiatal hernia in a patient with Roux-en-Y gastric bypass. Electronically Signed   By: Morgane  Naveau M.D.   On: 05/10/2023 01:36   DG Knee Complete 4 Views Right Result Date: 05/09/2023 CLINICAL DATA:  Worsening right knee pain. Total knee arthroplasty 04/25/2023 EXAM: RIGHT KNEE - COMPLETE 4+ VIEW COMPARISON:  Radiograph 04/25/2023 FINDINGS: Knee arthroplasty in expected alignment. There is no periprosthetic lucency or fracture. One chronic corticated density adjacent to the medial femoral condyle. Patellar resurfacing.  The lateral view is limited due to obliquity. There is a small joint effusion. Postoperative air in the joint has resolved in the interim. Mild generalized soft tissue edema persists. There are vascular calcifications. IMPRESSION: 1. Knee arthroplasty without hardware complication. 2. Small joint effusion. Electronically Signed   By: Andrea Gasman M.D.   On: 05/09/2023 23:25   DG Chest 2 View Result Date: 05/09/2023 CLINICAL DATA:  Chest pain. EXAM: CHEST - 2 VIEW COMPARISON:  09/13/2022 radiograph and CT FINDINGS: Stable heart size and mediastinal contours. Mild scarring in the left lung. No acute airspace disease. No pulmonary edema, large pleural effusion or pneumothorax. Remote right rib fractures. IMPRESSION: Mild scarring in the left lung. No acute findings. Electronically Signed   By: Andrea Gasman M.D.   On: 05/09/2023 23:23    PROCEDURES and INTERVENTIONS:  .1-3 Lead EKG Interpretation  Performed by: Claudene Rover, MD Authorized by: Claudene Rover, MD     Interpretation: normal  ECG rate:  70   ECG rate assessment: normal     Rhythm: sinus rhythm     Ectopy: none     Conduction: normal     Medications  oxyCODONE -acetaminophen  (PERCOCET/ROXICET) 5-325 MG per tablet 1 tablet (1 tablet Oral Given 05/09/23 2130)  iohexol  (OMNIPAQUE ) 350 MG/ML injection 100 mL (100 mLs Intravenous Contrast Given 05/10/23 0105)  fentaNYL  (SUBLIMAZE ) injection 50 mcg (50 mcg Intravenous Given 05/10/23 0050)  potassium chloride  SA (KLOR-CON  M) CR tablet 40 mEq (40 mEq Oral Given 05/10/23 0139)  ketamine  50 mg in normal saline 5 mL (10 mg/mL) syringe (20 mg Intravenous Given 05/10/23 0249)  ketorolac  (TORADOL ) 30 MG/ML injection 15 mg (15 mg Intravenous Given 05/10/23 0408)     IMPRESSION / MDM / ASSESSMENT AND PLAN / ED COURSE  I reviewed the triage vital signs and the nursing notes.  Differential diagnosis includes, but is not limited to, ACS, PTX, PNA, muscle strain/spasm, PE, dissection, anxiety,  pleural effusion  {Patient presents with symptoms of an acute illness or injury that is potentially life-threatening.  Patient with chronic pain syndrome presents with acute atraumatic chest the pain.  Essentially normal vital signs.  Exam is reassuring.  No signs of infection or trauma to the right knee.  No signs of distress.  Workup is generally reassuring.  Hypokalemia is noted with normal magnesium .  Nonischemic EKG with prolonged QTc.  Normal troponins x 2, no leukocytosis.  CTA chest without PE.  We will treat her analgesia with nonopioid analgesics and reassess.  No dizziness or syncope to suggest dysrhythmia associated with her QTc but we will keep her on the monitor.   Pain improved she remains without dysrhythmias.  She has an extensive workup without acute derangements.  She has been observed for 8 hours.  I considered observation admission for this patient but believe a trial of outpatient management would be reasonable.  She is okay with this.  We discussed close ED return precautions     FINAL CLINICAL IMPRESSION(S) / ED DIAGNOSES   Final diagnoses:  Other chest pain  Acute pain of right knee     Rx / DC Orders   ED Discharge Orders     None        Note:  This document was prepared using Dragon voice recognition software and may include unintentional dictation errors.   Claudene Rover, MD 05/10/23 [phone]

## 2023-05-10 NOTE — ED Notes (Signed)
 Patient calling out to RN station. This RN entered room and patient is writhing around in bed stating she is in pain and that she is "nervous". Patient assisted into a more upright position and Dr. Katrinka Blazing notified.

## 2023-06-10 ENCOUNTER — Ambulatory Visit: Payer: Self-pay | Admitting: Urology

## 2023-07-29 ENCOUNTER — Ambulatory Visit: Payer: Medicare (Managed Care) | Admitting: Urology

## 2023-08-26 ENCOUNTER — Ambulatory Visit (INDEPENDENT_AMBULATORY_CARE_PROVIDER_SITE_OTHER): Payer: Medicare (Managed Care) | Admitting: Urology

## 2023-08-26 ENCOUNTER — Encounter: Payer: Self-pay | Admitting: Urology

## 2023-08-26 VITALS — BP 111/65 | HR 80 | Ht 61.0 in | Wt 215.0 lb

## 2023-08-26 DIAGNOSIS — N3946 Mixed incontinence: Secondary | ICD-10-CM | POA: Diagnosis not present

## 2023-08-26 LAB — MICROSCOPIC EXAMINATION: Epithelial Cells (non renal): >10 /HPF — AB (ref 0–10)

## 2023-08-26 LAB — URINALYSIS, COMPLETE
Bilirubin, UA: NEGATIVE
Glucose, UA: NEGATIVE
Ketones, UA: NEGATIVE
Leukocytes,UA: NEGATIVE
Nitrite, UA: NEGATIVE
Protein,UA: NEGATIVE
RBC, UA: NEGATIVE
Specific Gravity, UA: 1.02 (ref 1.005–1.030)
Urobilinogen, Ur: 0.2 mg/dL (ref 0.2–1.0)
pH, UA: 5.5 (ref 5.0–7.5)

## 2023-08-26 NOTE — Progress Notes (Signed)
 08/26/2023 11:06 AM   Shelly Freeman Jan 02, 1951 [id number]  Referring provider: Izella Marshal, Alissa April, DO [address] [place],  [place] [address]  Chief Complaint  Patient presents with   Follow-up    HPI: SN: Patient seen by my partner for recurrent urinary tract infections and right-sided hydronephrosis with no stone.  20 years ago had a percutaneous procedure left kidney.  Has had a number lithotripsies saw a nurse practitioner and patient in May 2024 had pyelonephritis with positive culture.  The right sided hydronephrosis apparently waxes and wanes.  CT scan in March 2024 showed prominence of right collecting system with urothelial enhancement and right perinephric stranding and thickening of bladder in keeping with cystitis and ascending urinary tract infection.  She was given vaginal estrogen cream in May 2024.  Had 2 positive urine cultures in 2024.  Difficult historian even with translator.  Postvoid residual 27 mL.  Has milder frequency and nocturia.  Was on oxybutynin  and Myrbetriq  and the dose the Myrbetriq  increased in July 2024.  A renal ultrasound in July 2024 demonstrated no hydronephrosis.  Patient had an abnormality on the CT scan in May 2024 near the vaginal cuff and is being followed by Dr. Alvia Awkward.  2 with the last 3 urine cultures have been positive.  The inflammation and thickening of the bladder was seen on CT scan in March 2024 associated with the urothelial thickening of the right collecting system and stranding.  These findings were greatly improved on February 09, 2023   Today Patient reports urge incontinence.  She leaks a modest amount with coughing sneezing bending and lifting.  I asked the translator to have her concentrate on each question carefully but she tends to have a positive functional inquiry.  The first thing she said is that she wanted a bladder lift when I came into the room.  She does have high-volume bedwetting.  She soaks 6 pads a day   She voids every  1 hour and cannot hold it for 2 hours.  She gets up 4-5 times a night.   She is on oxybutynin .  She stopped the Myrbetriq  because she said it did not work   I do not think she has been treated for recent bladder infection in the last few weeks.  She does not think she is infected today.   She uses a walker for her knee.  She has had a hysterectomy.   The patient has mixed incontinence and high-volume bedwetting.  Likely the overactive bladder component is very significant.  It is important to control her urinary tract infections before we can help her incontinence.  She has had a lot of inflammation of her bladder as well as her right kidney radiographically.  She will come back for pelvic examination and cystoscopy.  I nicely told her that we need to control infections before we control incontinence and that we will likely need further testing in the future by did not discuss urodynamics specifically; and that immediately performing a bladder suspension is not her short-term treatment pathway.  Call if culture positive.  Trimethoprim  100 mg 30 x 11 sent    It turned out the patient may also been on Vesicare so I discontinued it and kept her only on oxybutynin .  Is difficult to say if any the medications gave false negatives because she has been infected so much over the last many months.  I again she had to be reminded although she has been incontinent for 2 years  we need to go through a stepwise approach     Last culture negative It appears the patient is taking the once a day antibiotic since its on the medical record although since she said she was not certain where she was not.  Still has incontinence both day and night   On pelvic examination she had mild grade 2 hypermobility the bladder neck and no stress incontinence before or after cystoscopy.  Very mild grade 1 cystocele and no rectocele   Cystoscopy: Patient underwent flexible cystoscopy.  There was a lot of white flecks in the urine.   She had some mild erythema in the floor and posterior wall the bladder in keeping with cystitis.  No carcinoma.  No pain with bladder filling.   Patient has mixed incontinence and primarily urge incontinence and bedwetting. Role of urodynamics discussed. She has tried multiple medications. If culture is positive I will be switching her to a different once a day antibiotic and this was discussed today. She tends to be quite upset about her symptoms. She understands a bladder spasms is not usually treated with a bladder suspension and that we need to control infections before we control her incontinence     Today Frequency stable.  Last culture positive During urodynamics patient did not void and was catheterized and 50 mL.  Maximum bladder capacity was 148 mL.  Bladder was hypersensitive.  She had multiple unstable detrusor contractions with urgency and she leaked a severe amount.  Pressure reached a size 45 cm of water.  She had no stress incontinence but only to generate a Valsalva pressure of 20 cm of water.  During voluntary voiding she voided 85 mL with a maximal flow 5 mL/s.  Maximum voiding pressure 36 mL of water.  Residual 6 mL.  EMG activity normal.  Bladder neck descent approximately 1 cm.  There were number and reproducibility of the overactivity was impressive.  She leaked each time.    The patient has a small capacity overactive bladder.  Likely 80% of bladder dysfunction is refractory overactive bladder.  I do not recommend a sling or bulking agent in this scenario based upon subjective and objective results.  She is very prone to bladder infections and has had impressive CT findings.  This may also complicate a number of treatments   Translator was excellent.  I think the patient understood things well.  Clinically now she is on Macrodantin  and I believe isolates this over the phone.  Clinically infection free.  We will try to get a urine before she leaves and sent for culture.  We need to  keep the urine sterile.  Come back on Gemtesa samples and prescription.  I briefly mention 3 refractory treatments but will discuss in detail next visit.  She understands a bladder suspension is not her treatment  Today No urine culture from last visit.  Patient says she is not on daily Macrodantin  and I asked her a few times.  Clinically not infected today.  She says she is completely dry on the [place] very happy.   PMH: Past Medical History:  Diagnosis Date   Acute respiratory failure with hypoxia [facility])    Age related osteoporosis    Angina at rest [facility])    Anginal pain (HCC)    Arthritis    Asthma    CAD (coronary artery disease)    a.) PCI/stent (vessel/type unknown) in approx 2017-2018 ([place] ); b.) MV 05/28/2016: no ischemia; c.) cardiac PET CT 03/18/2023: no ischemia  Cerebral microvascular disease    Cervical disc disorder with radiculopathy    Chronic pain syndrome    a.) on buprenorphine    COPD (chronic obstructive pulmonary disease) (HCC)    DDD (degenerative disc disease), lumbar 05/14/2016   Depression    Diastolic dysfunction    a.) TTE 01/22/2018L EF >55%, no RWMAs, G1DD, norm RVSF, triv AR/PR, mild MR/TR; b.) TTE 08/06/2019: EF >55%, no RWMAs, mild LVH, norm RVSF, mild MR/TR, RVSP 30.5; c.) TTE 12/19/2022: EF >55%, no RWMAs, norm RVSF, PASP 37   Dizziness    Dyspnea    GERD (gastroesophageal reflux disease)    Gram-negative bacteremia (E.coli) 01/26/2021   Heart valve problem    per grandaughter need surgery but they told her may not survive;   Hepatomegaly    Hiatal hernia    a.) s/p Nissen fundoplication; slipped (failed) fundoplication with recurrent hiatal hernia   History of 2019 novel coronavirus disease (COVID-19)    History of bilateral cataract extraction 11/2021   History of infection due to ESBL Escherichia coli    History of kidney stones    History of Roux-en-Y gastric bypass    HLD (hyperlipidemia)    Hypertension    Hypothyroidism    IDA  (iron deficiency anemia)    Insomnia    a.) taking trazodone  PRN   Intentional opioid overdose (HCC)    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)    Murmur    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   Pulmonary HTN (HCC)    a.) TTE 08/06/2019: RVSP 30.5 mmHg; b.) CTA chest 09/13/2022: dilated pulmonary arterial trunk suggestive of pHTN; c.) TTE 12/19/2022: PASP 37 mmHg   SLE (systemic lupus erythematosus) (HCC)    Sleep apnea    a.) does not require/utilize nocturnal PAP therapy   Vitamin D  deficiency 05/14/2016    Surgical History: Past Surgical History:  Procedure Laterality Date   APPENDECTOMY     BREAST BIOPSY Left    neg   CATARACT EXTRACTION W/PHACO Left 11/13/2021   Procedure: CATARACT EXTRACTION PHACO AND INTRAOCULAR LENS PLACEMENT (IOC) LEFT 3.63 00:35.9;  Surgeon: Rosa College, MD;  Location: [facility];  Service: Ophthalmology;  Laterality: Left;   CATARACT EXTRACTION W/PHACO Right 12/04/2021   Procedure: CATARACT EXTRACTION PHACO AND INTRAOCULAR LENS PLACEMENT (IOC) RIGHT;  Surgeon: Rosa College, MD;  Location: [facility];  Service: Ophthalmology;  Laterality: Right;  3.11 00:32.6   CESAREAN SECTION     x 2   COLONOSCOPY N/A 08/24/2021   Procedure: COLONOSCOPY;  Surgeon: Quintin Buckle, DO;  Location: [facility];  Service: Gastroenterology;  Laterality: N/A;  SPANISH INTERPRETER   COLONOSCOPY WITH PROPOFOL  N/A 07/01/2017   Procedure: COLONOSCOPY WITH PROPOFOL ;  Surgeon: Cassie Click, MD;  Location: [facility] ENDOSCOPY;  Service: Endoscopy;  Laterality: N/A;   CORONARY ANGIOPLASTY WITH STENT PLACEMENT     Location: [place], [place]   ESOPHAGOGASTRODUODENOSCOPY (EGD) WITH PROPOFOL  N/A 07/01/2017   Procedure: ESOPHAGOGASTRODUODENOSCOPY (EGD) WITH PROPOFOL ;  Surgeon: Cassie Click, MD;  Location: [facility] ENDOSCOPY;  Service: Endoscopy;  Laterality: N/A;   GASTRIC BYPASS     IR INJECT/THERA/INC NEEDLE/CATH/PLC  EPI/LUMB/SAC W/IMG  02/12/2023   LIPOMA EXCISION Left 06/14/2022   Procedure: EXCISION LIPOMA, left chest wall;  Surgeon: Alben Alma, MD;  Location: [facility] ORS;  Service: General;  Laterality: Left;   LITHOTRIPSY     NISSEN FUNDOPLICATION N/A    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  Procedure: TOTAL KNEE ARTHROPLASTY;  Surgeon: Molli Angelucci, MD;  Location: [facility] ORS;  Service: Orthopedics;  Laterality: Left;   TOTAL KNEE ARTHROPLASTY Right 04/25/2023   Procedure: TOTAL KNEE ARTHROPLASTY;  Surgeon: Venus Ginsberg, MD;  Location: [facility] OR[facility];  Service: Orthopedics;  Laterality: Right;   TUBAL LIGATION      Home Medications:  Allergies as of 08/26/2023       Reactions   Cholestyramine Other (See Comments)   severe        Medication List        Accurate as of August 26, 2023 11:06 AM. If you have any questions, ask your nurse or doctor.          STOP taking these medications    enoxaparin  40 MG/0.4ML injection Commonly known as: LOVENOX  Stopped by: Geralyn Knee A Hoby Kawai   oseltamivir 75 MG capsule Commonly known as: TAMIFLU Stopped by: Geralyn Knee A Sulma Ruffino   Tamiflu 30 MG capsule Generic drug: oseltamivir Stopped by: Geralyn Knee A Kiaraliz Rafuse       TAKE these medications    acetaminophen  500 MG tablet Commonly known as: TYLENOL  Take 2 tablets (1,000 mg total) by mouth every 8 (eight) hours.   albuterol  108 (90 Base) MCG/ACT inhaler Commonly known as: VENTOLIN  HFA Inhale into the lungs. Inhale 2 puffs into the lungs every 4 (four) hours as needed for wheezing or shortness of breath.   alendronate  70 MG tablet Commonly known as: FOSAMAX  Take 70 mg by mouth every Monday. Take with a full glass of water on an empty stomach.   atorvastatin  40 MG tablet Commonly known as: LIPITOR Take 40 mg by mouth at bedtime.   benzonatate 200 MG capsule Commonly known as: TESSALON Take 200 mg by mouth 3 (three) times daily as needed for cough.   buprenorphine  7.5  MCG/HR Commonly known as: BUTRANS  Place 1 patch onto the skin once a week. (Apply one patch to upper back/shoulder, outer arm, or upper chest alternating sites weekly on Wednesday at [facility]. May reuse site every 21 days.)   busPIRone  15 MG tablet Commonly known as: BUSPAR  Take 15 mg by mouth 2 (two) times daily.   celecoxib  100 MG capsule Commonly known as: CELEBREX  Take 100 mg by mouth 2 (two) times daily.   cholecalciferol  25 MCG (1000 UNIT) tablet Commonly known as: VITAMIN D3 Take 1,000 Units by mouth daily.   cyanocobalamin  1000 MCG tablet Commonly known as: VITAMIN B12 Take 1,000 mcg by mouth daily.   Dextromethorphan -guaiFENesin  10-100 MG/5ML liquid Take 10 mLs by mouth 3 (three) times daily as needed (for cough and nasal congestion).   diclofenac  Sodium 1 % Gel Commonly known as: VOLTAREN  Apply 4 g topically 4 (four) times daily. For pain in the right knee. Rub into knee for 1 minute for each application.   docusate sodium  100 MG capsule Commonly known as: COLACE Take 1 capsule (100 mg total) by mouth 2 (two) times daily.   escitalopram  20 MG tablet Commonly known as: LEXAPRO  Take 20 mg by mouth daily.   famotidine  20 MG tablet Commonly known as: PEPCID  Take 20 mg by mouth 2 (two) times daily.   ferrous sulfate  325 (65 FE) MG tablet Take 325 mg by mouth every Monday, Wednesday, and Friday.   fluticasone  50 MCG/ACT nasal spray Commonly known as: FLONASE  Place 1 spray into both nostrils daily.   folic acid  1 MG tablet Commonly known as: FOLVITE  Take 1 mg by mouth daily.   gabapentin  600 MG tablet Commonly known as:  NEURONTIN  Take 600 mg by mouth 3 (three) times daily.   Gemtesa 75 MG Tabs Generic drug: Vibegron Take by mouth daily.   HYDROcodone -acetaminophen  5-325 MG tablet Commonly known as: NORCO/VICODIN Take 1-2 tablets by mouth every 6 (six) hours as needed for severe pain (pain score 7-10).   levothyroxine  100 MCG tablet Commonly known as:  SYNTHROID  Take 100 mcg by mouth daily before breakfast.   lidocaine  5 % Commonly known as: LIDODERM  Place 1 patch onto the skin daily. Remove & Discard patch within 12 hours or as directed by MD   metoprolol  succinate 25 MG 24 hr tablet Commonly known as: TOPROL -XL Take 1 tablet (25 mg total) by mouth daily.   Mintox Maximum Strength 400-400-40 MG/5ML suspension Generic drug: alum & mag hydroxide-simeth Take 10-20 mLs by mouth 3 (three) times daily as needed (reflux or gas).   nitrofurantoin  100 MG capsule Commonly known as: MACRODANTIN  Take by mouth daily.   nystatin cream Commonly known as: MYCOSTATIN Apply 1 Application topically 2 (two) times daily.   Olopatadine HCl 0.2 % Soln Apply to eye.   ondansetron  4 MG tablet Commonly known as: ZOFRAN  Take 1 tablet (4 mg total) by mouth every 6 (six) hours as needed for nausea.   OPTIVE 0.5-0.9 % ophthalmic solution Generic drug: carboxymethylcellul-glycerin  Place 2 drops into both eyes 4 (four) times daily as needed for dry eyes (for irritated eyes).   pantoprazole  20 MG tablet Commonly known as: PROTONIX  Take 20 mg by mouth 2 (two) times daily.   PSYLLIUM PO Take 17 g by mouth daily.   QUEtiapine  50 MG tablet Commonly known as: SEROQUEL  Take 50 mg by mouth at bedtime.   Symbicort 80-4.5 MCG/ACT inhaler Generic drug: budesonide -formoterol  Inhale 2 puffs into the lungs in the morning and at bedtime.   traZODone  100 MG tablet Commonly known as: DESYREL  Take 100 mg by mouth at bedtime.   traZODone  50 MG tablet Commonly known as: DESYREL  Take 25 mg by mouth at bedtime.   Vitamin D  (Ergocalciferol ) 1.25 MG (50000 UNIT) Caps capsule Commonly known as: DRISDOL  Take 50,000 Units by mouth every Monday.        Allergies:  Allergies  Allergen Reactions   Cholestyramine Other (See Comments)    severe    Family History: Family History  Problem Relation Age of Onset   Breast cancer Maternal Aunt    Breast  cancer Cousin     Social History:  reports that she has never smoked. She has never been exposed to tobacco smoke. She has never used smokeless tobacco. She reports that she does not drink alcohol  and does not use drugs.  ROS:                                        Physical Exam: BP 111/65   Pulse 80   Ht 5\' 1"  (1.549 m)   Wt 97.5 kg   BMI 40.62 kg/m    Laboratory Data: Lab Results  Component Value Date   WBC 8.4 05/09/2023   HGB 10.9 (L) 05/09/2023   HCT 34.6 (L) 05/09/2023   MCV 95.8 05/09/2023   PLT 377 05/09/2023    Lab Results  Component Value Date   CREATININE 0.64 05/09/2023    No results found for: "PSA"  No results found for: "TESTOSTERONE"  Lab Results  Component Value Date   HGBA1C 5.6 04/04/2016  Urinalysis    Component Value Date/Time   COLORURINE YELLOW (A) 04/11/2023 0914   APPEARANCEUR HAZY (A) 04/11/2023 0914   APPEARANCEUR Clear 04/01/2023 1422   LABSPEC 1.020 04/11/2023 0914   PHURINE 5.0 04/11/2023 0914   GLUCOSEU NEGATIVE 04/11/2023 0914   HGBUR NEGATIVE 04/11/2023 0914   BILIRUBINUR NEGATIVE 04/11/2023 0914   BILIRUBINUR Negative 04/01/2023 1422   KETONESUR NEGATIVE 04/11/2023 0914   PROTEINUR NEGATIVE 04/11/2023 0914   NITRITE NEGATIVE 04/11/2023 0914   LEUKOCYTESUR NEGATIVE 04/11/2023 0914    Pertinent Imaging: Urine normal  Assessment & Plan: Reassess in 6 months on Gemtesa.  This is very good news.  If she starts getting bladder infections treat the infection and put her back on daily Macrodantin .  Call if urine culture positive  1. Mixed stress and urge urinary incontinence (Primary)  - Urinalysis, Complete   No follow-ups on file.  Devorah Fonder, MD  [facility] [address] [place], [place] [address] [phone]

## 2023-08-29 LAB — CULTURE, URINE COMPREHENSIVE

## 2023-10-12 ENCOUNTER — Other Ambulatory Visit: Payer: Self-pay

## 2023-10-12 ENCOUNTER — Inpatient Hospital Stay
Admission: EM | Admit: 2023-10-12 | Discharge: 2023-10-16 | DRG: 389 | Disposition: A | Discharge status: Home / Self Care | Payer: Medicare (Managed Care) | Attending: Internal Medicine | Admitting: Internal Medicine

## 2023-10-12 ENCOUNTER — Emergency Department: Payer: Medicare (Managed Care)

## 2023-10-12 DIAGNOSIS — Z8614 Personal history of Methicillin resistant Staphylococcus aureus infection: Secondary | ICD-10-CM

## 2023-10-12 DIAGNOSIS — K567 Ileus, unspecified: Principal | ICD-10-CM | POA: Diagnosis present

## 2023-10-12 DIAGNOSIS — G894 Chronic pain syndrome: Secondary | ICD-10-CM | POA: Diagnosis present

## 2023-10-12 DIAGNOSIS — M329 Systemic lupus erythematosus, unspecified: Secondary | ICD-10-CM | POA: Diagnosis present

## 2023-10-12 DIAGNOSIS — J449 Chronic obstructive pulmonary disease, unspecified: Secondary | ICD-10-CM | POA: Diagnosis present

## 2023-10-12 DIAGNOSIS — J4489 Other specified chronic obstructive pulmonary disease: Secondary | ICD-10-CM | POA: Diagnosis present

## 2023-10-12 DIAGNOSIS — I10 Essential (primary) hypertension: Secondary | ICD-10-CM | POA: Diagnosis present

## 2023-10-12 DIAGNOSIS — Z8616 Personal history of COVID-19: Secondary | ICD-10-CM

## 2023-10-12 DIAGNOSIS — E66813 Obesity, class 3: Secondary | ICD-10-CM | POA: Diagnosis present

## 2023-10-12 DIAGNOSIS — R1030 Lower abdominal pain, unspecified: Secondary | ICD-10-CM

## 2023-10-12 DIAGNOSIS — Z6841 Body Mass Index (BMI) 40.0 and over, adult: Secondary | ICD-10-CM

## 2023-10-12 DIAGNOSIS — N390 Urinary tract infection, site not specified: Principal | ICD-10-CM | POA: Diagnosis present

## 2023-10-12 DIAGNOSIS — F419 Anxiety disorder, unspecified: Secondary | ICD-10-CM | POA: Diagnosis present

## 2023-10-12 DIAGNOSIS — N179 Acute kidney failure, unspecified: Secondary | ICD-10-CM | POA: Diagnosis present

## 2023-10-12 DIAGNOSIS — Z9851 Tubal ligation status: Secondary | ICD-10-CM

## 2023-10-12 DIAGNOSIS — Z79890 Hormone replacement therapy (postmenopausal): Secondary | ICD-10-CM

## 2023-10-12 DIAGNOSIS — Z7983 Long term (current) use of bisphosphonates: Secondary | ICD-10-CM

## 2023-10-12 DIAGNOSIS — I2510 Atherosclerotic heart disease of native coronary artery without angina pectoris: Secondary | ICD-10-CM | POA: Diagnosis present

## 2023-10-12 DIAGNOSIS — Z7951 Long term (current) use of inhaled steroids: Secondary | ICD-10-CM

## 2023-10-12 DIAGNOSIS — M810 Age-related osteoporosis without current pathological fracture: Secondary | ICD-10-CM | POA: Diagnosis present

## 2023-10-12 DIAGNOSIS — Z955 Presence of coronary angioplasty implant and graft: Secondary | ICD-10-CM

## 2023-10-12 DIAGNOSIS — G4733 Obstructive sleep apnea (adult) (pediatric): Secondary | ICD-10-CM | POA: Diagnosis present

## 2023-10-12 DIAGNOSIS — M51369 Other intervertebral disc degeneration, lumbar region without mention of lumbar back pain or lower extremity pain: Secondary | ICD-10-CM | POA: Diagnosis present

## 2023-10-12 DIAGNOSIS — Z79899 Other long term (current) drug therapy: Secondary | ICD-10-CM

## 2023-10-12 DIAGNOSIS — I952 Hypotension due to drugs: Secondary | ICD-10-CM | POA: Diagnosis present

## 2023-10-12 DIAGNOSIS — M5010 Cervical disc disorder with radiculopathy, unspecified cervical region: Secondary | ICD-10-CM | POA: Diagnosis present

## 2023-10-12 DIAGNOSIS — E039 Hypothyroidism, unspecified: Secondary | ICD-10-CM | POA: Diagnosis present

## 2023-10-12 DIAGNOSIS — Z9841 Cataract extraction status, right eye: Secondary | ICD-10-CM

## 2023-10-12 DIAGNOSIS — Z87442 Personal history of urinary calculi: Secondary | ICD-10-CM

## 2023-10-12 DIAGNOSIS — Z9049 Acquired absence of other specified parts of digestive tract: Secondary | ICD-10-CM

## 2023-10-12 DIAGNOSIS — E785 Hyperlipidemia, unspecified: Secondary | ICD-10-CM | POA: Diagnosis present

## 2023-10-12 DIAGNOSIS — Z9842 Cataract extraction status, left eye: Secondary | ICD-10-CM

## 2023-10-12 DIAGNOSIS — Z9884 Bariatric surgery status: Secondary | ICD-10-CM

## 2023-10-12 DIAGNOSIS — K219 Gastro-esophageal reflux disease without esophagitis: Secondary | ICD-10-CM | POA: Diagnosis present

## 2023-10-12 DIAGNOSIS — Z9151 Personal history of suicidal behavior: Secondary | ICD-10-CM

## 2023-10-12 DIAGNOSIS — Z66 Do not resuscitate: Secondary | ICD-10-CM | POA: Diagnosis present

## 2023-10-12 DIAGNOSIS — Z96653 Presence of artificial knee joint, bilateral: Secondary | ICD-10-CM | POA: Diagnosis present

## 2023-10-12 DIAGNOSIS — F32A Depression, unspecified: Secondary | ICD-10-CM | POA: Diagnosis present

## 2023-10-12 DIAGNOSIS — F418 Other specified anxiety disorders: Secondary | ICD-10-CM | POA: Diagnosis present

## 2023-10-12 DIAGNOSIS — Z961 Presence of intraocular lens: Secondary | ICD-10-CM | POA: Diagnosis present

## 2023-10-12 DIAGNOSIS — K5641 Fecal impaction: Secondary | ICD-10-CM | POA: Diagnosis present

## 2023-10-12 HISTORY — DX: Disorder of kidney and ureter, unspecified: N28.9

## 2023-10-12 LAB — CBC
HCT: 43.4 % (ref 36.0–46.0)
Hemoglobin: 13.3 g/dL (ref 12.0–15.0)
MCH: 30.2 pg (ref 26.0–34.0)
MCHC: 30.6 g/dL (ref 30.0–36.0)
MCV: 98.4 fL (ref 80.0–100.0)
Platelets: 211 10*3/uL (ref 150–400)
RBC: 4.41 MIL/uL (ref 3.87–5.11)
RDW: 14.9 % (ref 11.5–15.5)
WBC: 9.1 10*3/uL (ref 4.0–10.5)
nRBC: 0 % (ref 0.0–0.2)

## 2023-10-12 LAB — COMPREHENSIVE METABOLIC PANEL WITH GFR
ALT: 31 U/L (ref 0–44)
AST: 33 U/L (ref 15–41)
Albumin: 4 g/dL (ref 3.5–5.0)
Alkaline Phosphatase: 98 U/L (ref 38–126)
Anion gap: 10 (ref 5–15)
BUN: 27 mg/dL — ABNORMAL HIGH (ref 8–23)
CO2: 27 mmol/L (ref 22–32)
Calcium: 10.1 mg/dL (ref 8.9–10.3)
Chloride: 101 mmol/L (ref 98–111)
Creatinine, Ser: 1.06 mg/dL — ABNORMAL HIGH (ref 0.44–1.00)
GFR, Estimated: 56 mL/min — ABNORMAL LOW (ref >60)
Glucose, Bld: 82 mg/dL (ref 70–99)
Potassium: 5 mmol/L (ref 3.5–5.1)
Sodium: 138 mmol/L (ref 135–145)
Total Bilirubin: 1 mg/dL (ref 0.0–1.2)
Total Protein: 7.2 g/dL (ref 6.5–8.1)

## 2023-10-12 LAB — URINALYSIS, ROUTINE W REFLEX MICROSCOPIC
Bacteria, UA: NONE SEEN
Bilirubin Urine: NEGATIVE
Glucose, UA: NEGATIVE mg/dL
Hgb urine dipstick: NEGATIVE
Ketones, ur: NEGATIVE mg/dL
Leukocytes,Ua: NEGATIVE
Nitrite: POSITIVE — AB
Protein, ur: NEGATIVE mg/dL
Specific Gravity, Urine: 1.017 (ref 1.005–1.030)
pH: 8 (ref 5.0–8.0)

## 2023-10-12 LAB — LACTIC ACID, PLASMA: Lactic Acid, Venous: 1.8 mmol/L (ref 0.5–1.9)

## 2023-10-12 LAB — LIPASE, BLOOD: Lipase: 30 U/L (ref 11–51)

## 2023-10-12 MED ORDER — IOHEXOL 300 MG/ML  SOLN
100.0000 mL | Freq: Once | INTRAMUSCULAR | Status: AC | PRN
  Administered 2023-10-12: 100 mL via INTRAVENOUS

## 2023-10-12 MED ORDER — ESCITALOPRAM OXALATE 10 MG PO TABS
20.0000 mg | ORAL_TABLET | Freq: Every day | ORAL | Status: DC
  Administered 2023-10-13 – 2023-10-16 (×4): 20 mg via ORAL
  Filled 2023-10-12 (×4): qty 2

## 2023-10-12 MED ORDER — SODIUM CHLORIDE 0.9 % IV BOLUS
500.0000 mL | Freq: Once | INTRAVENOUS | Status: AC
  Administered 2023-10-12: 500 mL via INTRAVENOUS

## 2023-10-12 MED ORDER — ATORVASTATIN CALCIUM 20 MG PO TABS
40.0000 mg | ORAL_TABLET | Freq: Every day | ORAL | Status: DC
  Administered 2023-10-13 – 2023-10-15 (×4): 40 mg via ORAL
  Filled 2023-10-12 (×4): qty 2

## 2023-10-12 MED ORDER — ALBUTEROL SULFATE (2.5 MG/3ML) 0.083% IN NEBU
2.5000 mg | INHALATION_SOLUTION | Freq: Four times a day (QID) | RESPIRATORY_TRACT | Status: DC
  Administered 2023-10-13: 2.5 mg via RESPIRATORY_TRACT
  Filled 2023-10-12: qty 3

## 2023-10-12 MED ORDER — FERROUS SULFATE 325 (65 FE) MG PO TABS
325.0000 mg | ORAL_TABLET | ORAL | Status: DC
  Administered 2023-10-14 – 2023-10-16 (×2): 325 mg via ORAL
  Filled 2023-10-12 (×2): qty 1

## 2023-10-12 MED ORDER — VITAMIN B-12 1000 MCG PO TABS
1000.0000 ug | ORAL_TABLET | Freq: Every day | ORAL | Status: DC
  Administered 2023-10-13 – 2023-10-16 (×4): 1000 ug via ORAL
  Filled 2023-10-12 (×4): qty 1

## 2023-10-12 MED ORDER — ONDANSETRON HCL 4 MG PO TABS: 4.0000 mg | ORAL_TABLET | Freq: Four times a day (QID) | ORAL | Status: DC | PRN

## 2023-10-12 MED ORDER — BUPRENORPHINE 7.5 MCG/HR TD PTWK
1.0000 | MEDICATED_PATCH | TRANSDERMAL | Status: DC
  Administered 2023-10-13: 1 via TRANSDERMAL
  Filled 2023-10-12: qty 1

## 2023-10-12 MED ORDER — LEVOTHYROXINE SODIUM 100 MCG PO TABS
100.0000 ug | ORAL_TABLET | Freq: Every day | ORAL | Status: DC
  Administered 2023-10-13 – 2023-10-16 (×4): 100 ug via ORAL
  Filled 2023-10-12 (×4): qty 1

## 2023-10-12 MED ORDER — SODIUM CHLORIDE 0.9 % IV SOLN
2.0000 g | Freq: Once | INTRAVENOUS | Status: AC
  Administered 2023-10-12: 2 g via INTRAVENOUS
  Filled 2023-10-12: qty 20

## 2023-10-12 MED ORDER — ONDANSETRON HCL 4 MG/2ML IJ SOLN
4.0000 mg | Freq: Four times a day (QID) | INTRAMUSCULAR | Status: DC | PRN
  Administered 2023-10-13 (×2): 4 mg via INTRAVENOUS
  Filled 2023-10-12 (×2): qty 2

## 2023-10-12 MED ORDER — ENOXAPARIN SODIUM 60 MG/0.6ML IJ SOSY
0.5000 mg/kg | PREFILLED_SYRINGE | INTRAMUSCULAR | Status: DC
  Administered 2023-10-13 – 2023-10-16 (×4): 50 mg via SUBCUTANEOUS
  Filled 2023-10-12 (×4): qty 0.6

## 2023-10-12 MED ORDER — HYDROMORPHONE HCL 1 MG/ML IJ SOLN
1.0000 mg | Freq: Once | INTRAMUSCULAR | Status: AC
  Administered 2023-10-12: 1 mg via INTRAVENOUS
  Filled 2023-10-12: qty 1

## 2023-10-12 MED ORDER — ACETAMINOPHEN 650 MG RE SUPP: 650.0000 mg | Freq: Four times a day (QID) | RECTAL | Status: DC | PRN

## 2023-10-12 MED ORDER — ACETAMINOPHEN 325 MG PO TABS
650.0000 mg | ORAL_TABLET | Freq: Four times a day (QID) | ORAL | Status: DC | PRN
  Administered 2023-10-14 – 2023-10-15 (×2): 650 mg via ORAL
  Filled 2023-10-12 (×2): qty 2

## 2023-10-12 MED ORDER — HYDROCODONE-ACETAMINOPHEN 5-325 MG PO TABS
1.0000 | ORAL_TABLET | ORAL | Status: DC | PRN
  Administered 2023-10-13 (×3): 2 via ORAL
  Filled 2023-10-12 (×4): qty 2

## 2023-10-12 MED ORDER — ONDANSETRON HCL 4 MG/2ML IJ SOLN
4.0000 mg | Freq: Once | INTRAMUSCULAR | Status: AC
Start: 2023-10-12 — End: 2023-10-12
  Administered 2023-10-12: 4 mg via INTRAVENOUS
  Filled 2023-10-12: qty 2

## 2023-10-12 MED ORDER — PANTOPRAZOLE SODIUM 20 MG PO TBEC
20.0000 mg | DELAYED_RELEASE_TABLET | Freq: Two times a day (BID) | ORAL | Status: DC
  Administered 2023-10-13 – 2023-10-16 (×8): 20 mg via ORAL
  Filled 2023-10-12 (×8): qty 1

## 2023-10-12 MED ORDER — BUSPIRONE HCL 10 MG PO TABS
15.0000 mg | ORAL_TABLET | Freq: Two times a day (BID) | ORAL | Status: DC
  Administered 2023-10-13 – 2023-10-16 (×8): 15 mg via ORAL
  Filled 2023-10-12 (×8): qty 2

## 2023-10-12 MED ORDER — MORPHINE SULFATE (PF) 2 MG/ML IV SOLN
2.0000 mg | INTRAVENOUS | Status: DC | PRN
  Administered 2023-10-13: 2 mg via INTRAVENOUS
  Filled 2023-10-12: qty 1

## 2023-10-12 MED ORDER — ALBUTEROL SULFATE HFA 108 (90 BASE) MCG/ACT IN AERS: 1.0000 | INHALATION_SPRAY | RESPIRATORY_TRACT | Status: DC | PRN

## 2023-10-12 NOTE — Plan of Care (Signed)

## 2023-10-12 NOTE — ED Notes (Signed)
 Pt desaturated into mid 80's after giving 2nd dose of dilaudid . Pt placed on supp O2 via [place] at 2lpm

## 2023-10-12 NOTE — Hospital Course (Signed)
 UTI with intractable pain UTI

## 2023-10-12 NOTE — ED Notes (Signed)
 Pt assisted onto bedpan.

## 2023-10-12 NOTE — ED Triage Notes (Signed)
 To ED [facility] for lower abdominal pain, dysuria, chills, oliguria, nausea, and lower back pain since 4 days, worse since 2 days ago. Taking prescribed abx for UTI since Tuesday. States hx renal stones, L back pain worse than R. Pt is moaning hyperventilating. Pt started vomiting in triage.

## 2023-10-12 NOTE — ED Provider Notes (Signed)
 [facility] Provider Note    Event Date/Time   First MD Initiated Contact with Patient 10/12/23 1736     (approximate)   History   Abdominal Pain, Back Pain, Dysuria, and Cystitis   HPI  Shelly Freeman is a 73 y.o. female with a history of CAD, hyperlipidemia, and SLE who presents with lower abdominal and back pain, gradual onset over the last 4 days, acutely worsened today, and bilateral in location.  She reports associated nausea and vomiting but no diarrhea.  She also reports dysuria and frequency.  She has been taking antibiotic for UTI for the last few days.  I reviewed the past medical records.  The patient was admitted in December last year for total knee replacement.   Physical Exam   Triage Vital Signs: ED Triage Vitals  Encounter Vitals Group     BP 10/12/23 1731 (!) 145/67     Systolic BP Percentile --      Diastolic BP Percentile --      Pulse Rate 10/12/23 1731 68     Resp 10/12/23 1731 (!) 26     Temp 10/12/23 1731 98.4 F (36.9 C)     Temp Source 10/12/23 1731 Axillary     SpO2 10/12/23 1731 99 %     Weight 10/12/23 1730 216 lb (98 kg)     Height 10/12/23 1730 5\' 1"  (1.549 m)     Head Circumference --      Peak Flow --      Pain Score 10/12/23 1727 8     Pain Loc --      Pain Education --      Exclude from Growth Chart --     Most recent vital signs: Vitals:   10/12/23 2230 10/12/23 2309  BP: (!) 110/59 (!) 121/50  Pulse: 66 70  Resp:  19  Temp:  97.9 F (36.6 C)  SpO2: 95% 98%     General: Alert, very anxious and uncomfortable appearing. CV:  Good peripheral perfusion.  Resp:  Normal effort.  Abd:  Soft with mild bilateral lower quadrant tenderness.  No distention.  Other:  Bilateral CVA tenderness to even very minimal touch.   ED Results / Procedures / Treatments   Labs (all labs ordered are listed, but only abnormal results are displayed) Labs Reviewed  COMPREHENSIVE METABOLIC PANEL WITH GFR - Abnormal;  Notable for the following components:      Result Value   BUN 27 (*)    Creatinine, Ser 1.06 (*)    GFR, Estimated 56 (*)    All other components within normal limits  URINALYSIS, ROUTINE W REFLEX MICROSCOPIC - Abnormal; Notable for the following components:   Color, Urine AMBER (*)    APPearance CLEAR (*)    Nitrite POSITIVE (*)    All other components within normal limits  URINE CULTURE  LIPASE, BLOOD  CBC  LACTIC ACID, PLASMA     EKG     RADIOLOGY  CT abdomen/pelvis: I independently viewed and interpreted the images; there are no dilated bowel loops or any free air or free fluid.  Radiology report indicates large stool burden with no acute findings.   PROCEDURES:  Critical Care performed: No  Procedures   MEDICATIONS ORDERED IN ED: Medications  morphine  (PF) 2 MG/ML injection 2 mg (has no administration in time range)  buprenorphine  (BUTRANS ) 7.5 MCG/HR 1 patch (has no administration in time range)  atorvastatin  (LIPITOR) tablet 40 mg (has no administration in  time range)  busPIRone  (BUSPAR ) tablet 15 mg (has no administration in time range)  escitalopram  (LEXAPRO ) tablet 20 mg (has no administration in time range)  levothyroxine  (SYNTHROID ) tablet 100 mcg (has no administration in time range)  pantoprazole  (PROTONIX ) EC tablet 20 mg (has no administration in time range)  ferrous sulfate  tablet 325 mg (has no administration in time range)  cyanocobalamin  (VITAMIN B12) tablet 1,000 mcg (has no administration in time range)  enoxaparin  (LOVENOX ) injection 50 mg (has no administration in time range)  acetaminophen  (TYLENOL ) tablet 650 mg (has no administration in time range)    Or  acetaminophen  (TYLENOL ) suppository 650 mg (has no administration in time range)  HYDROcodone -acetaminophen  (NORCO/VICODIN) 5-325 MG per tablet 1-2 tablet (has no administration in time range)  ondansetron  (ZOFRAN ) tablet 4 mg (has no administration in time range)    Or  ondansetron   (ZOFRAN ) injection 4 mg (has no administration in time range)  albuterol  (PROVENTIL ) (2.5 MG/3ML) 0.083% nebulizer solution 2.5 mg (has no administration in time range)  ondansetron  (ZOFRAN ) injection 4 mg (4 mg Intravenous Given 10/12/23 1752)  HYDROmorphone  (DILAUDID ) injection 1 mg (1 mg Intravenous Given 10/12/23 1752)  sodium chloride  0.9 % bolus 500 mL (0 mLs Intravenous Stopped 10/12/23 1912)  iohexol  (OMNIPAQUE ) 300 MG/ML solution 100 mL (100 mLs Intravenous Contrast Given 10/12/23 1857)  HYDROmorphone  (DILAUDID ) injection 1 mg (1 mg Intravenous Given 10/12/23 1939)  cefTRIAXone  (ROCEPHIN ) 2 g in sodium chloride  0.9 % 100 mL IVPB (0 g Intravenous Stopped 10/12/23 2303)     IMPRESSION / MDM / ASSESSMENT AND PLAN / ED COURSE  I reviewed the triage vital signs and the nursing notes.  73 year old female with PMH as noted above presents with bilateral lower abdominal and back pain over the last several days associated with nausea, vomiting, and urinary symptoms.  On exam the patient is extremely anxious and uncomfortable appearing with no peritoneal signs, but pain to minimal palpation of the lower abdomen and back.  Differential diagnosis includes, but is not limited to, UTI//cystitis, pyelonephritis, colitis, diverticulitis, SBO, volvulus less likely mesenteric ischemia.  We will obtain lab workup, CT, and reassess.  Patient's presentation is most consistent with acute presentation with potential threat to life or bodily function.  ----------------------------------------- 10:10 PM on 10/12/2023 -----------------------------------------   Lab workup is reassuring.  CBC shows no leukocytosis.  CMP shows no significant abnormalities.  Lactate is negative.  CT is negative for acute findings.  Urinalysis shows 21-50 WBCs and is nitrite positive.  On further discussion with the patient, she is not sure the name of the antibiotic she is taking but describes a 300 mg tablet which  purple and turquoise in  color.  This is consistent with Omnicef .  Given that she has failed outpatient antibiotics and appears to have an ongoing UTI, as well as the severe pain, she will need admission for further management.  I consulted Dr. Vallarie Gauze from the hospitalist service; based on our discussion she agrees to evaluate the patient for admission.   FINAL CLINICAL IMPRESSION(S) / ED DIAGNOSES   Final diagnoses:  Urinary tract infection without hematuria, site unspecified  Lower abdominal pain     Rx / DC Orders   ED Discharge Orders     None        Note:  This document was prepared using Dragon voice recognition software and may include unintentional dictation errors.    Lind Repine, MD 10/13/23 0020

## 2023-10-12 NOTE — ED Notes (Signed)
 MD at the bedside

## 2023-10-13 DIAGNOSIS — N10 Acute tubulo-interstitial nephritis: Secondary | ICD-10-CM

## 2023-10-13 DIAGNOSIS — N390 Urinary tract infection, site not specified: Secondary | ICD-10-CM | POA: Diagnosis not present

## 2023-10-13 MED ORDER — POLYETHYLENE GLYCOL 3350 17 G PO PACK: 17.0000 g | PACK | Freq: Every day | ORAL | Status: DC | PRN

## 2023-10-13 MED ORDER — PHENAZOPYRIDINE HCL 200 MG PO TABS: 200.0000 mg | ORAL_TABLET | Freq: Three times a day (TID) | ORAL | Status: DC | PRN

## 2023-10-13 MED ORDER — SODIUM CHLORIDE 0.9 % IV SOLN
1.0000 g | INTRAVENOUS | Status: DC
  Administered 2023-10-13 – 2023-10-14 (×2): 1 g via INTRAVENOUS
  Filled 2023-10-13 (×3): qty 10

## 2023-10-13 MED ORDER — SENNOSIDES-DOCUSATE SODIUM 8.6-50 MG PO TABS
2.0000 | ORAL_TABLET | Freq: Every day | ORAL | Status: DC
  Administered 2023-10-13: 2 via ORAL
  Filled 2023-10-13 (×2): qty 2

## 2023-10-13 MED ORDER — ALBUTEROL SULFATE (2.5 MG/3ML) 0.083% IN NEBU
2.5000 mg | INHALATION_SOLUTION | Freq: Four times a day (QID) | RESPIRATORY_TRACT | Status: DC | PRN
Start: 2023-10-13 — End: 2023-10-16

## 2023-10-13 MED ORDER — QUETIAPINE FUMARATE 25 MG PO TABS
50.0000 mg | ORAL_TABLET | ORAL | Status: AC
  Administered 2023-10-13: 50 mg via ORAL
  Filled 2023-10-13: qty 2

## 2023-10-13 MED ORDER — LIDOCAINE 5 % EX PTCH
1.0000 | MEDICATED_PATCH | CUTANEOUS | Status: DC
  Administered 2023-10-13 – 2023-10-14 (×2): 1 via TRANSDERMAL
  Filled 2023-10-13 (×3): qty 1

## 2023-10-13 MED ORDER — ESTROGENS CONJUGATED 0.625 MG/GM VA CREA
1.0000 | TOPICAL_CREAM | VAGINAL | Status: DC
  Administered 2023-10-13: 1 via VAGINAL
  Filled 2023-10-13: qty 30

## 2023-10-13 MED ORDER — CAMPHOR-MENTHOL 0.5-0.5 % EX LOTN
TOPICAL_LOTION | CUTANEOUS | Status: DC | PRN
  Administered 2023-10-15: 1 via TOPICAL
  Filled 2023-10-13: qty 222

## 2023-10-13 MED ORDER — MORPHINE SULFATE (PF) 2 MG/ML IV SOLN
1.0000 mg | INTRAVENOUS | Status: DC | PRN
  Administered 2023-10-13 – 2023-10-15 (×7): 1 mg via INTRAVENOUS
  Filled 2023-10-13 (×7): qty 1

## 2023-10-13 MED ORDER — DIPHENHYDRAMINE HCL 25 MG PO CAPS
50.0000 mg | ORAL_CAPSULE | Freq: Four times a day (QID) | ORAL | Status: AC | PRN
  Administered 2023-10-13 – 2023-10-14 (×2): 50 mg via ORAL
  Filled 2023-10-13 (×2): qty 2

## 2023-10-13 MED ORDER — FLEET ENEMA RE ENEM
1.0000 | ENEMA | Freq: Once | RECTAL | Status: AC
  Administered 2023-10-13: 1 via RECTAL

## 2023-10-13 MED ORDER — POLYETHYLENE GLYCOL 3350 17 G PO PACK
17.0000 g | PACK | Freq: Every day | ORAL | Status: DC
  Administered 2023-10-13: 17 g via ORAL
  Filled 2023-10-13 (×2): qty 1

## 2023-10-13 NOTE — Evaluation (Signed)
 Occupational Therapy Evaluation Patient Details Name: Shelly Freeman MRN: [id number] DOB: [birth year] Today's Date: 10/13/2023   History of Present Illness   Pt is a 73 y/o F admitted on 10/12/23 after presenting with c/o worsening L sided back pain. Pt is being treated for UTI. PMH: SLE, OA, morbid obesity, anginal pain, asthma, CAD, cerebral microvascular disease, chronic pain syndrome, lumbar DDD, depression, hiatal hernia, HLD, HTN     Clinical Impressions Pt was seen for OT evaluation this date. PTA, pt lives at home alone where she is IND with ADLs and uses a RW for mobility. Her granddaughter does her grocery shopping. [facility].   Pt presents to acute OT demonstrating impaired ADL performance and functional mobility 2/2 weakness, pain and low activity tolerance. Pt currently requires Min/Mod A for trunkal elevation to reach EOB. Min A for STS from EOB and toilet transfer using grab bar. CGA for standing hand hygiene and oral care at sink. Supervision for peri-care iva lateral leans. Pt with 7/10 L sided back pain and reports of nausea and mild dizziness during session. Pt would benefit from skilled OT services to address noted impairments and functional limitations to maximize safety and independence while minimizing falls risk and caregiver burden. Do anticipate the need for follow up OT services upon acute hospital DC.      If plan is discharge home, recommend the following:   A little help with walking and/or transfers;A lot of help with bathing/dressing/bathroom;Assist for transportation;Assistance with cooking/housework;Help with stairs or ramp for entrance     Functional Status Assessment   Patient has had a recent decline in their functional status and demonstrates the ability to make significant improvements in function in a reasonable and predictable amount of time.     Equipment Recommendations   None recommended by OT      Recommendations for Other Services         Precautions/Restrictions   Precautions Precautions: Fall Recall of Precautions/Restrictions: Intact Restrictions Weight Bearing Restrictions Per Provider Order: No     Mobility Bed Mobility Overal bed mobility: Needs Assistance Bed Mobility: Supine to Sit, Sit to Supine     Supine to sit: Mod assist, Min assist, Used rails, HOB elevated Sit to supine: Contact guard assist, Used rails   General bed mobility comments: able to manage BLEs, needs Min/Mod A for trunkal elevation    Transfers Overall transfer level: Needs assistance Equipment used: Rolling walker (2 wheels) Transfers: Sit to/from Stand Sit to Stand: Min assist           General transfer comment: Min A for STS from EOB to RW and CGA for in room ambulation to toilet and back      Balance Overall balance assessment: Needs assistance Sitting-balance support: Feet supported Sitting balance-Leahy Scale: Fair     Standing balance support: During functional activity, Bilateral upper extremity supported, Reliant on assistive device for balance Standing balance-Leahy Scale: Fair                             ADL either performed or assessed with clinical judgement   ADL Overall ADL's : Needs assistance/impaired     Grooming: Wash/dry face;Wash/dry hands;Standing;Contact guard assist                   Toilet Transfer: Minimal assistance;Rolling walker (2 wheels);Grab bars   Toileting- Clothing Manipulation and Hygiene: Contact guard assist;Sit to/from  stand;Sitting/lateral lean       Functional mobility during ADLs: Contact guard assist;Rolling walker (2 wheels)       Vision         Perception         Praxis         Pertinent Vitals/Pain Pain Assessment Pain Assessment: 0-10 Pain Score: 7  Pain Location: back, lower abdomen/pelvis Pain Descriptors / Indicators: Discomfort, Grimacing, Guarding Pain Intervention(s):  Limited activity within patient's tolerance, Monitored during session, Repositioned     Extremity/Trunk Assessment Upper Extremity Assessment Upper Extremity Assessment: Generalized weakness   Lower Extremity Assessment Lower Extremity Assessment: Generalized weakness   Cervical / Trunk Assessment Cervical / Trunk Assessment: Kyphotic   Communication Communication Communication:  (utilized ipad interpreter Shelagh Derrick [phone])   Cognition Arousal: Alert Behavior During Therapy: Anxious                                 Following commands: Impaired Following commands impaired: Follows one step commands with increased time, Follows one step commands inconsistently     Cueing  General Comments   Cueing Techniques: Verbal cues;Visual cues;Gestural cues      Exercises Other Exercises Other Exercises: Edu on role of OT in acute setting   Shoulder Instructions      Home Living Family/patient expects to be discharged to:: Private residence Living Arrangements: Alone Available Help at Discharge: Family;Available PRN/intermittently Type of Home: Apartment Home Access: Level entry     Home Layout: One level     Bathroom Shower/Tub: Chief Strategy Officer: Standard     Home Equipment: Agricultural consultant (2 wheels);Rollator (4 wheels);BSC/3in1   Additional Comments: [facility] from 9-3pm      Prior Functioning/Environment Prior Level of Function : Independent/Modified Independent;History of Falls (last six months)             Mobility Comments: Information from chart & from pt. Pt reports she ambulates with walker, lives alone. ADLs Comments: Ind with ADLs, reports granddaughter does her grocery shopping; utilizes bus transportation to [employer]    OT Problem List: Decreased strength;Decreased activity tolerance;Impaired balance (sitting and/or standing);Pain   OT Treatment/Interventions: Self-care/ADL training;Therapeutic exercise;Therapeutic  activities;Energy conservation;Patient/family education;Balance training;DME and/or AE instruction      OT Goals(Current goals can be found in the care plan section)   Acute Rehab OT Goals Patient Stated Goal: feel better OT Goal Formulation: With patient Time For Goal Achievement: 10/26/23 Potential to Achieve Goals: Good ADL Goals Pt Will Perform Lower Body Bathing: with contact guard assist;sit to/from stand;sitting/lateral leans Pt Will Perform Lower Body Dressing: with contact guard assist;sit to/from stand;sitting/lateral leans Pt Will Transfer to Toilet: with supervision;regular height toilet;ambulating Pt Will Perform Toileting - Clothing Manipulation and hygiene: with modified independence;sitting/lateral leans;sit to/from stand   OT Frequency:  Min 2X/week    Co-evaluation              AM-PAC OT "6 Clicks" Daily Activity     Outcome Measure Help from another person eating meals?: None Help from another person taking care of personal grooming?: A Little Help from another person toileting, which includes using toliet, bedpan, or urinal?: A Little Help from another person bathing (including washing, rinsing, drying)?: A Lot Help from another person to put on and taking off regular upper body clothing?: A Little Help from another person to put on and taking off regular lower body clothing?: A Lot 6  Click Score: 17   End of Session Equipment Utilized During Treatment: Rolling walker (2 wheels);Gait belt Nurse Communication: Mobility status  Activity Tolerance: Patient tolerated treatment well;Patient limited by pain Patient left: in bed;with call bell/phone within reach;with bed alarm set  OT Visit Diagnosis: Other abnormalities of gait and mobility (R26.89);Pain;Muscle weakness (generalized) (M62.81)                Time: 1610-9604 OT Time Calculation (min): 34 min Charges:  OT General Charges $OT Visit: 1 Visit OT Evaluation $OT Eval Moderate Complexity: 1  Mod OT Treatments $Self Care/Home Management : 8-22 mins Lincoln Ginley, OTR/L 10/13/23, 1:20 PM  Attie Nawabi E Danecia Underdown 10/13/2023, 1:17 PM

## 2023-10-13 NOTE — Assessment & Plan Note (Signed)
 Rocephin   Follow cultures

## 2023-10-13 NOTE — H&P (Signed)
 History and Physical    Patient: Shelly Freeman ZOX:[id number] DOB: 02/09/1951 DOA: 10/12/2023 DOS: the patient was seen and examined on 10/13/2023 PCP: Izella Marshal, Alissa April, DO  Patient coming from: Home  Chief Complaint:  Chief Complaint  Patient presents with   Abdominal Pain   Back Pain   Dysuria   Cystitis   HPI: Shelly Freeman is a 73 y.o. female with medical history significant of SLE, followed by rheumatology, not on immunosuppressive medication, last seen 5/8, osteoarthritis on buprenorphine , morbid obesity, being admitted for UTI, with persistent symptoms in spite of outpatient antibiotics for the past 3 days.  Her main complaint was worsening of left-sided back pain.  She also she also has nausea and a few episodes of vomiting. ED course and data review: Mildly tachypneic with otherwise normal vitals.  CBC and CMP normal except for mild elevation in creatinine to 1.06 above her baseline of 0.64.  Urine with positive nitrites.  Lipase and LFTs normal.  CT abdomen and pelvis showing large stool burden with no other acute findings.  No evidence of kidney stones. Patient treated with Dilaudid  IV fluids and started on Rocephin  Admission requested   Past Medical History:  Diagnosis Date   Acute respiratory failure with hypoxia [facility])    Age related osteoporosis    Angina at rest [facility])    Anginal pain (HCC)    Arthritis    Asthma    CAD (coronary artery disease)    a.) PCI/stent (vessel/type unknown) in approx 2017-2018 ([place] ); b.) MV 05/28/2016: no ischemia; c.) cardiac PET CT 03/18/2023: no ischemia   Cerebral microvascular disease    Cervical disc disorder with radiculopathy    Chronic pain syndrome    a.) on buprenorphine    COPD (chronic obstructive pulmonary disease) (HCC)    DDD (degenerative disc disease), lumbar 05/14/2016   Depression    Diastolic dysfunction    a.) TTE 01/22/2018L EF >55%, no RWMAs, G1DD, norm RVSF, triv AR/PR, mild MR/TR; b.) TTE 08/06/2019: EF  >55%, no RWMAs, mild LVH, norm RVSF, mild MR/TR, RVSP 30.5; c.) TTE 12/19/2022: EF >55%, no RWMAs, norm RVSF, PASP 37   Dizziness    Dyspnea    GERD (gastroesophageal reflux disease)    Gram-negative bacteremia (E.coli) 01/26/2021   Heart valve problem    per grandaughter need surgery but they told her may not survive;   Hepatomegaly    Hiatal hernia    a.) s/p Nissen fundoplication; slipped (failed) fundoplication with recurrent hiatal hernia   History of 2019 novel coronavirus disease (COVID-19)    History of bilateral cataract extraction 11/2021   History of infection due to ESBL Escherichia coli    History of kidney stones    History of Roux-en-Y gastric bypass    HLD (hyperlipidemia)    Hypertension    Hypothyroidism    IDA (iron deficiency anemia)    Insomnia    a.) taking trazodone  PRN   Intentional opioid overdose (HCC)    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)    Murmur    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   Pulmonary HTN (HCC)    a.) TTE 08/06/2019: RVSP 30.5 mmHg; b.) CTA chest 09/13/2022: dilated pulmonary arterial trunk suggestive of pHTN; c.) TTE 12/19/2022: PASP 37 mmHg   Renal disorder    kidney stones   SLE (systemic lupus erythematosus) (HCC)    Sleep apnea    a.) does not require/utilize nocturnal PAP therapy   Vitamin D  deficiency  05/14/2016   Past Surgical History:  Procedure Laterality Date   APPENDECTOMY     BREAST BIOPSY Left    neg   CATARACT EXTRACTION W/PHACO Left 11/13/2021   Procedure: CATARACT EXTRACTION PHACO AND INTRAOCULAR LENS PLACEMENT (IOC) LEFT 3.63 00:35.9;  Surgeon: Rosa College, MD;  Location: [facility];  Service: Ophthalmology;  Laterality: Left;   CATARACT EXTRACTION W/PHACO Right 12/04/2021   Procedure: CATARACT EXTRACTION PHACO AND INTRAOCULAR LENS PLACEMENT (IOC) RIGHT;  Surgeon: Rosa College, MD;  Location: [facility];  Service: Ophthalmology;  Laterality: Right;   3.11 00:32.6   CESAREAN SECTION     x 2   COLONOSCOPY N/A 08/24/2021   Procedure: COLONOSCOPY;  Surgeon: Quintin Buckle, DO;  Location: [facility] ENDOSCOPY;  Service: Gastroenterology;  Laterality: N/A;  SPANISH INTERPRETER   COLONOSCOPY WITH PROPOFOL  N/A 07/01/2017   Procedure: COLONOSCOPY WITH PROPOFOL ;  Surgeon: Cassie Click, MD;  Location: [facility] ENDOSCOPY;  Service: Endoscopy;  Laterality: N/A;   CORONARY ANGIOPLASTY WITH STENT PLACEMENT     Location: [place], [place]   ESOPHAGOGASTRODUODENOSCOPY (EGD) WITH PROPOFOL  N/A 07/01/2017   Procedure: ESOPHAGOGASTRODUODENOSCOPY (EGD) WITH PROPOFOL ;  Surgeon: Cassie Click, MD;  Location: [facility] ENDOSCOPY;  Service: Endoscopy;  Laterality: N/A;   GASTRIC BYPASS     IR INJECT/THERA/INC NEEDLE/CATH/PLC EPI/LUMB/SAC W/IMG  02/12/2023   LIPOMA EXCISION Left 06/14/2022   Procedure: EXCISION LIPOMA, left chest wall;  Surgeon: Alben Alma, MD;  Location: [facility] ORS;  Service: General;  Laterality: Left;   LITHOTRIPSY     NISSEN FUNDOPLICATION N/A    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   Procedure: TOTAL KNEE ARTHROPLASTY;  Surgeon: Molli Angelucci, MD;  Location: [facility] ORS;  Service: Orthopedics;  Laterality: Left;   TOTAL KNEE ARTHROPLASTY Right 04/25/2023   Procedure: TOTAL KNEE ARTHROPLASTY;  Surgeon: Venus Ginsberg, MD;  Location: [facility] ORS;  Service: Orthopedics;  Laterality: Right;   TUBAL LIGATION     Social History:  reports that she has never smoked. She has never been exposed to tobacco smoke. She has never used smokeless tobacco. She reports that she does not drink alcohol  and does not use drugs.  Allergies  Allergen Reactions   Cholestyramine Other (See Comments)    severe    Family History  Problem Relation Age of Onset   Breast cancer Maternal Aunt    Breast cancer Cousin     Prior to Admission medications   Medication Sig Start Date End Date Taking? Authorizing Provider  acetaminophen  (TYLENOL ) 500 MG tablet  Take 2 tablets (1,000 mg total) by mouth every 8 (eight) hours. 04/26/23   Coralyn Derry, PA-C  albuterol  (VENTOLIN  HFA) 108 (90 Base) MCG/ACT inhaler Inhale into the lungs. Inhale 2 puffs into the lungs every 4 (four) hours as needed for wheezing or shortness of breath.    [provider]  alendronate  (FOSAMAX ) 70 MG tablet Take 70 mg by mouth every Monday. Take with a full glass of water on an empty stomach.    [provider]  alum & mag hydroxide-simeth (MINTOX MAXIMUM STRENGTH) 400-400-40 MG/5ML suspension Take 10-20 mLs by mouth 3 (three) times daily as needed (reflux or gas).    [provider]  atorvastatin  (LIPITOR) 40 MG tablet Take 40 mg by mouth at bedtime. 02/05/23   [provider]  benzonatate (TESSALON) 200 MG capsule Take 200 mg by mouth 3 (three) times daily as needed for cough.    [provider]  buprenorphine  (BUTRANS )  7.5 MCG/HR Place 1 patch onto the skin once a week. (Apply one patch to upper back/shoulder, outer arm, or upper chest alternating sites weekly on Wednesday at [facility]. May reuse site every 21 days.) 02/14/23   Lorita Rosa, MD  busPIRone  (BUSPAR ) 15 MG tablet Take 15 mg by mouth 2 (two) times daily. 12/05/21   [provider]  celecoxib  (CELEBREX ) 100 MG capsule Take 100 mg by mouth 2 (two) times daily. 12/14/21   [provider]  cholecalciferol  (VITAMIN D3) 25 MCG (1000 UNIT) tablet Take 1,000 Units by mouth daily. 12/05/21   [provider]  cyanocobalamin  (VITAMIN B12) 1000 MCG tablet Take 1,000 mcg by mouth daily. 07/06/22   [provider]  Dextromethorphan -guaiFENesin  10-100 MG/5ML liquid Take 10 mLs by mouth 3 (three) times daily as needed (for cough and nasal congestion). 10/31/22   [provider]  diclofenac  Sodium (VOLTAREN ) 1 % GEL Apply 4 g topically 4 (four) times daily. For pain in the right knee. Rub into knee for 1 minute for each application.    [provider]  docusate sodium  (COLACE) 100 MG capsule Take 1 capsule (100 mg total) by mouth 2 (two) times daily. 04/26/23   Coralyn Derry, PA-C  escitalopram  (LEXAPRO ) 20 MG tablet Take 20 mg by mouth daily.    [provider]  famotidine  (PEPCID ) 20 MG tablet Take 20 mg by mouth 2 (two) times daily.    [provider]  ferrous sulfate  325 (65 FE) MG tablet Take 325 mg by mouth every Monday, Wednesday, and Friday.    [provider]  fluticasone  (FLONASE ) 50 MCG/ACT nasal spray Place 1 spray into both nostrils daily.    [provider]  folic acid  (FOLVITE ) 1 MG tablet Take 1 mg by mouth daily.    [provider]  gabapentin  (NEURONTIN ) 600 MG tablet Take 600 mg by mouth 3 (three) times daily. 05/07/21   [provider]  HYDROcodone -acetaminophen  (NORCO/VICODIN) 5-325 MG tablet Take 1-2 tablets by mouth every 6 (six) hours as needed for severe pain (pain score 7-10). 04/26/23   Coralyn Derry, PA-C  levothyroxine  (SYNTHROID ) 100 MCG tablet Take 100 mcg by mouth daily before breakfast.    [provider]  lidocaine  (LIDODERM ) 5 % Place 1 patch onto the skin daily. Remove & Discard patch within 12 hours or as directed by MD    [provider]  metoprolol  succinate (TOPROL -XL) 25 MG 24 hr tablet Take 1 tablet (25 mg total) by mouth daily. 02/15/23   Lorita Rosa, MD  nitrofurantoin  (MACRODANTIN ) 100 MG capsule Take by mouth daily. 08/06/23   [provider]  nystatin cream (MYCOSTATIN) Apply 1 Application topically 2 (two) times daily.    [provider]  Olopatadine HCl 0.2 % SOLN Apply to eye. 08/19/23   [provider]  ondansetron  (ZOFRAN ) 4 MG tablet Take 1 tablet (4 mg total) by mouth every 6 (six) hours as needed for nausea. 04/26/23   Coralyn Derry, PA-C  OPTIVE 0.5-0.9 % ophthalmic solution Place 2 drops into both eyes 4 (four) times daily as needed for dry eyes (for irritated eyes). 01/10/23    [provider]  pantoprazole  (PROTONIX ) 20 MG tablet Take 20 mg by mouth 2 (two) times daily. 08/08/22   [provider]  PSYLLIUM PO Take 17 g by mouth daily.    [provider]  QUEtiapine  (SEROQUEL ) 50 MG tablet Take 50 mg by mouth at bedtime.  [provider]  SYMBICORT 80-4.5 MCG/ACT inhaler Inhale 2 puffs into the lungs in the morning and at bedtime. 04/07/23   [provider]  traZODone  (DESYREL ) 100 MG tablet Take 100 mg by mouth at bedtime.    [provider]  traZODone  (DESYREL ) 50 MG tablet Take 25 mg by mouth at bedtime. 01/05/21   [provider]  Vibegron (GEMTESA) 75 MG TABS Take by mouth daily.    [provider]  Vitamin D , Ergocalciferol , (DRISDOL ) 1.25 MG (50000 UNIT) CAPS capsule Take 50,000 Units by mouth every Monday. 01/06/23   [provider]    Physical Exam: Vitals:   10/12/23 2130 10/12/23 2200 10/12/23 2230 10/12/23 2309  BP: (!) 112/53 97/62 (!) 110/59 (!) 121/50  Pulse: 71 69 66 70  Resp:    19  Temp:    97.9 F (36.6 C)  TempSrc:    Oral  SpO2: 95% 95% 95% 98%  Weight:      Height:       Physical Exam Vitals and nursing note reviewed.  Constitutional:      General: She is not in acute distress.    Comments: Patient moaning out in pain  HENT:     Head: Normocephalic and atraumatic.  Cardiovascular:     Rate and Rhythm: Normal rate and regular rhythm.     Heart sounds: Normal heart sounds.  Pulmonary:     Effort: Pulmonary effort is normal.     Breath sounds: Normal breath sounds.  Abdominal:     Palpations: Abdomen is soft.     Tenderness: There is no abdominal tenderness.  Neurological:     Mental Status: Mental status is at baseline.     Data Reviewed: Relevant notes from primary care and specialist visits, past discharge summaries as available in EHR, including Care Everywhere. Prior diagnostic testing as pertinent to current admission diagnoses Updated  medications and problem lists for reconciliation ED course, including vitals, labs, imaging, treatment and response to treatment Triage notes, nursing and pharmacy notes and ED provider's notes Notable results as noted in HPI   Assessment and Plan: * Urinary tract infection Rocephin  Follow cultures  AKI (acute kidney injury) (HCC) IV hydration, monitor and avoid nephrotoxins   Chronic pain Multimodal pain control Continue home opiates   History of nephrolithiasis No evidence of nephrolithiasis on imaging   Hypotension due to medication Essential hypertension Patient was hypertensive on admission but had a drop in blood pressure with Dilaudid  Monitor blood pressure closely with ongoing pain meds Hold home antihypertensives   COPD (chronic obstructive pulmonary disease) (HCC) Not acutely exacerbated Continue home inhalers with DuoNebs as needed   Hypothyroidism Continue levothyroxine    Sleep apnea Does not use CPAP   Obesity, Class III, BMI 40-49.9 (morbid obesity) (HCC) Complicating factor to overall prognosis and care   Advance Care Planning:   Code Status: Do not attempt resuscitation (DNR) PRE-ARREST INTERVENTIONS DESIRED   Consults: none  Family Communication: none  Severity of Illness: The appropriate patient status for this patient is OBSERVATION. Observation status is judged to be reasonable and necessary in order to provide the required intensity of service to ensure the patient's safety. The patient's presenting symptoms, physical exam findings, and initial radiographic and laboratory data in the context of their medical condition is felt to place them at decreased risk for further clinical deterioration. Furthermore, it is anticipated that the patient will be medically stable for discharge from the hospital within 2 midnights of admission.  Author: Lanetta Pion, MD 10/13/2023 3:12 AM  For on call review [email or web address].

## 2023-10-13 NOTE — Assessment & Plan Note (Signed)
 IV hydration, monitor and avoid nephrotoxins

## 2023-10-13 NOTE — Plan of Care (Signed)
  Problem: Clinical Measurements: Goal: Ability to maintain clinical measurements within normal limits will improve Outcome: Progressing   Problem: Pain Managment: Goal: General experience of comfort will improve and/or be controlled Outcome: Progressing   Problem: Safety: Goal: Ability to remain free from injury will improve Outcome: Progressing   Problem: Elimination: Goal: Will not experience complications related to urinary retention Outcome: Progressing   Problem: Elimination: Goal: Will not experience complications related to bowel motility Outcome: Progressing

## 2023-10-13 NOTE — Progress Notes (Addendum)
 Briefly, patient is a 73 year old female with history of SLE not on any immune suppressive medication who was admitted earlier today with left-sided back pain and suprapubic pain despite having been treated for UTI as an outpatient for 3 days.  CT of abdomen pelvis was notable for a large stool burden, no kidney stones.  This morning patient is hyperventilating and somewhat teary complaining of pain in her epigastric region and notes that she has pain in her back and her belly.  Patient admits to being constipated.  Physical exam is notable for a soft abdomen that is possibly tender in her suprapubic and bilateral lower quadrant areas however this is inconsistent with distraction.  Patient was unable to get up so I was unable to assess for CVA tenderness.  Assessment and plan: Unclear if patient has developed complicated UTI/pyelonephritis with failure of outpatient treatment.  Will continue ceftriaxone  day #1 today pending urine culture and blood cultures.  Unfortunately patient had already gotten antibiotics as an outpatient and there was no bacteria seen on her UA although there was significant WBC and nitrites.  I think some of her abdominal pain is referable to her constipation.  Patient is on opioids.  I will start her on some laxatives and suppository to see if she can evacuate the stool.  If not she would benefit from enemas and may be from [employer] [facility] or similar med.  Of note, patient developed itching after taking Vicodin.  Vicodin was discontinued.  Trial of Benadryl  and Sarna lotion.  Patient is noted to be very anxious by RN.  Of note, patient's medicine reconciliation has not yet been done although she is on multiple psych meds including quetiapine  and trazodone .  I have spoken with pharmacy so they can expedite med rec.  At present, will order quetiapine  50 mg p.o. x 1 and restart her Lexapro  20 mg daily.  Will also order trazodone  100 mg at bedtime but she may be on 150--this needs to be  clarified by the med rec.  Patient also complaining of ongoing dysuria, worsened after PureWick although she does not want the PureWick removed.  I suspect she has atrophic vaginitis, will start estrogen vaginal cream which will help prevent mucosal tearing with a pure wick.  Will also try Pyridium.  She is also on morphine  as needed.  Until her med rec can be done, the morphine  will prevent her from going into withdrawal from her narcotics.

## 2023-10-13 NOTE — Hospital Course (Signed)
   UTI Possible pyelonephritis Being treated as an outpatient now with left flank pain with nausea and vomiting On ceftriaxone  Urine cultures ordered and pending  Large stool burden Patient on narcotics for chronic pain  SLE Not on any immunosuppressants

## 2023-10-13 NOTE — Evaluation (Signed)
 Physical Therapy Evaluation Patient Details Name: Shelly Freeman MRN: [id number] DOB: Apr 16, 1951 Today's Date: 10/13/2023  History of Present Illness  Pt is a 73 y/o F admitted on 10/12/23 after presenting with c/o worsening L sided back pain. Pt is being treated for UTI. PMH: SLE, OA, morbid obesity, anginal pain, asthma, CAD, cerebral microvascular disease, chronic pain syndrome, lumbar DDD, depression, hiatal hernia, HLD, HTN  Clinical Impression  Pt seen for PT evaluation with pt agreeable with encouragement. Some PLOF/home set up information obtained from chart & from pt. Prior to admission pt was living in a level entry apartment, alone, attending [facility] during the day. On this date, pt endorses R knee pain but primarily back pain (apparently worse than her chronic pain). Pt also notes dizziness lying in bed but VSS & nurse in room to assess. Pt requires min<>mod assist for bed mobility, min assist +2 for sit<>Stand from EOB with RW & step pivot bed>recliner with extra time & min assist +1. Pt reports need to void & PT attempted to assist pt to [facility] but pt would not initiate movement despite encouragement. Pt appears anxious 2/2 dizziness & pain. Nurse notified of pt's potential need for purewick. Will continue to follow pt acutely to progress mobility as able. At this time, recommend post acute rehab <3 hours therapy/day upon d/c as pt is currently unsafe to d/c home alone.      If plan is discharge home, recommend the following: A little help with walking and/or transfers;A little help with bathing/dressing/bathroom;Assistance with cooking/housework;Assist for transportation;Help with stairs or ramp for entrance   Can travel by private vehicle   Yes    Equipment Recommendations None recommended by PT  Recommendations for Other Services  Rehab consult;OT consult    Functional Status Assessment Patient has had a recent decline in their functional status and demonstrates the ability to make  significant improvements in function in a reasonable and predictable amount of time.     Precautions / Restrictions Precautions Precautions: Fall Restrictions Weight Bearing Restrictions Per Provider Order: No      Mobility  Bed Mobility Overal bed mobility: Needs Assistance Bed Mobility: Supine to Sit     Supine to sit: Mod assist, Min assist (Pt holds to PT's hand vs bed rails despite cuing/encouragement to use bed rails. Mod assist to upright trunk to sitting EOB.)          Transfers Overall transfer level: Needs assistance Equipment used: Rolling walker (2 wheels) Transfers: Sit to/from Stand, Bed to chair/wheelchair/BSC Sit to Stand: Min assist, +2 physical assistance   Step pivot transfers: Min assist       General transfer comment: sit<>Stand from EOB with BUE on RW vs pushing to standing, extra time to power up. Extra time for step pivot bed>recliner with RW.    Ambulation/Gait                  Stairs            Wheelchair Mobility     Tilt Bed    Modified Rankin (Stroke Patients Only)       Balance Overall balance assessment: Needs assistance Sitting-balance support: Feet supported Sitting balance-Leahy Scale: Fair     Standing balance support: During functional activity, Bilateral upper extremity supported, Reliant on assistive device for balance Standing balance-Leahy Scale: Fair  Pertinent Vitals/Pain Pain Assessment Pain Assessment: Faces Pain Location: back, lower abdomen/pelvis Pain Descriptors / Indicators: Discomfort, Grimacing, Guarding Pain Intervention(s): Monitored during session, Limited activity within patient's tolerance, Repositioned, RN gave pain meds during session, Premedicated before session, Utilized relaxation techniques (pain patch on back)    Home Living Family/patient expects to be discharged to:: Private residence Living Arrangements: Alone Available Help at  Discharge: Family;Available PRN/intermittently Type of Home: Apartment Home Access: Level entry       Home Layout: One level Home Equipment: Agricultural consultant (2 wheels);Rollator (4 wheels);BSC/3in1 Additional Comments: [facility] M-F from 9-3pm    Prior Function Prior Level of Function : Independent/Modified Independent;History of Falls (last six months)             Mobility Comments: Information from chart & from pt. Pt reports she ambulates with walker, lives alone.       Extremity/Trunk Assessment   Upper Extremity Assessment Upper Extremity Assessment: Generalized weakness (assessment limited 2/2 pain)    Lower Extremity Assessment Lower Extremity Assessment: Generalized weakness (assessment limited 2/2 pain)    Cervical / Trunk Assessment Cervical / Trunk Assessment: Kyphotic  Communication   Communication Communication:  (utilized iPad interpreter Jody Mura, [phone]))    Cognition Arousal: Alert Behavior During Therapy: Anxious   PT - Cognitive impairments: Difficult to assess Difficult to assess due to:  (2/2 anxiety & difficulty following commands throughout session)                       Following commands: Impaired Following commands impaired: Follows one step commands with increased time, Follows one step commands inconsistently     Cueing Cueing Techniques: Verbal cues, Visual cues, Gestural cues     General Comments General comments (skin integrity, edema, etc.): Pt received on supplemental O2, weaned to room air, SpO2 >90% on room air & pt left on room air. VSS.    Exercises     Assessment/Plan    PT Assessment Patient needs continued PT services  PT Problem List Decreased strength;Pain;Decreased activity tolerance;Decreased balance;Decreased mobility;Decreased knowledge of use of DME;Decreased safety awareness       PT Treatment Interventions Gait training;Neuromuscular re-education;DME instruction;Balance training;Stair  training;Functional mobility training;Therapeutic activities;Therapeutic exercise;Patient/family education;Modalities    PT Goals (Current goals can be found in the Care Plan section)  Acute Rehab PT Goals Patient Stated Goal: decreased pain PT Goal Formulation: With patient Time For Goal Achievement: 10/27/23 Potential to Achieve Goals: Good    Frequency Min 2X/week     Co-evaluation               AM-PAC PT "6 Clicks" Mobility  Outcome Measure Help needed turning from your back to your side while in a flat bed without using bedrails?: A Lot Help needed moving from lying on your back to sitting on the side of a flat bed without using bedrails?: A Lot Help needed moving to and from a bed to a chair (including a wheelchair)?: A Little Help needed standing up from a chair using your arms (e.g., wheelchair or bedside chair)?: A Little Help needed to walk in hospital room?: A Lot Help needed climbing 3-5 steps with a railing? : Total 6 Click Score: 13    End of Session   Activity Tolerance: Patient limited by pain Patient left: in chair;with call bell/phone within reach;with nursing/sitter in room Nurse Communication: Mobility status PT Visit Diagnosis: Pain;Muscle weakness (generalized) (M62.81);Difficulty in walking, not elsewhere classified (R26.2) Pain - part of body:  (  back)    Time: 4098-1191 PT Time Calculation (min) (ACUTE ONLY): 20 min   Charges:   PT Evaluation $PT Eval Low Complexity: 1 Low   PT General Charges $$ ACUTE PT VISIT: 1 Visit         Emaline Handsome, PT, DPT 10/13/23, 9:11 AM   Venetta Gill 10/13/2023, 9:09 AM

## 2023-10-14 ENCOUNTER — Inpatient Hospital Stay: Payer: Medicare (Managed Care)

## 2023-10-14 DIAGNOSIS — M5010 Cervical disc disorder with radiculopathy, unspecified cervical region: Secondary | ICD-10-CM | POA: Diagnosis present

## 2023-10-14 DIAGNOSIS — Z6841 Body Mass Index (BMI) 40.0 and over, adult: Secondary | ICD-10-CM | POA: Diagnosis not present

## 2023-10-14 DIAGNOSIS — N179 Acute kidney failure, unspecified: Secondary | ICD-10-CM | POA: Diagnosis present

## 2023-10-14 DIAGNOSIS — Z66 Do not resuscitate: Secondary | ICD-10-CM | POA: Diagnosis present

## 2023-10-14 DIAGNOSIS — Z8616 Personal history of COVID-19: Secondary | ICD-10-CM | POA: Diagnosis not present

## 2023-10-14 DIAGNOSIS — G4733 Obstructive sleep apnea (adult) (pediatric): Secondary | ICD-10-CM | POA: Diagnosis present

## 2023-10-14 DIAGNOSIS — K5641 Fecal impaction: Secondary | ICD-10-CM | POA: Diagnosis present

## 2023-10-14 DIAGNOSIS — Z96653 Presence of artificial knee joint, bilateral: Secondary | ICD-10-CM | POA: Diagnosis present

## 2023-10-14 DIAGNOSIS — Z961 Presence of intraocular lens: Secondary | ICD-10-CM | POA: Diagnosis present

## 2023-10-14 DIAGNOSIS — E66813 Obesity, class 3: Secondary | ICD-10-CM | POA: Diagnosis present

## 2023-10-14 DIAGNOSIS — I2510 Atherosclerotic heart disease of native coronary artery without angina pectoris: Secondary | ICD-10-CM | POA: Diagnosis present

## 2023-10-14 DIAGNOSIS — I952 Hypotension due to drugs: Secondary | ICD-10-CM | POA: Diagnosis present

## 2023-10-14 DIAGNOSIS — E039 Hypothyroidism, unspecified: Secondary | ICD-10-CM | POA: Diagnosis present

## 2023-10-14 DIAGNOSIS — M810 Age-related osteoporosis without current pathological fracture: Secondary | ICD-10-CM | POA: Diagnosis present

## 2023-10-14 DIAGNOSIS — G894 Chronic pain syndrome: Secondary | ICD-10-CM | POA: Diagnosis present

## 2023-10-14 DIAGNOSIS — K219 Gastro-esophageal reflux disease without esophagitis: Secondary | ICD-10-CM | POA: Diagnosis present

## 2023-10-14 DIAGNOSIS — F419 Anxiety disorder, unspecified: Secondary | ICD-10-CM | POA: Diagnosis present

## 2023-10-14 DIAGNOSIS — I10 Essential (primary) hypertension: Secondary | ICD-10-CM | POA: Diagnosis present

## 2023-10-14 DIAGNOSIS — F32A Depression, unspecified: Secondary | ICD-10-CM | POA: Diagnosis present

## 2023-10-14 DIAGNOSIS — K567 Ileus, unspecified: Secondary | ICD-10-CM | POA: Diagnosis present

## 2023-10-14 DIAGNOSIS — M329 Systemic lupus erythematosus, unspecified: Secondary | ICD-10-CM | POA: Diagnosis present

## 2023-10-14 DIAGNOSIS — M51369 Other intervertebral disc degeneration, lumbar region without mention of lumbar back pain or lower extremity pain: Secondary | ICD-10-CM | POA: Diagnosis present

## 2023-10-14 DIAGNOSIS — J4489 Other specified chronic obstructive pulmonary disease: Secondary | ICD-10-CM | POA: Diagnosis present

## 2023-10-14 DIAGNOSIS — E785 Hyperlipidemia, unspecified: Secondary | ICD-10-CM | POA: Diagnosis present

## 2023-10-14 DIAGNOSIS — N10 Acute tubulo-interstitial nephritis: Secondary | ICD-10-CM | POA: Diagnosis not present

## 2023-10-14 LAB — CBC
HCT: 42.1 % (ref 36.0–46.0)
Hemoglobin: 13.4 g/dL (ref 12.0–15.0)
MCH: 30.6 pg (ref 26.0–34.0)
MCHC: 31.8 g/dL (ref 30.0–36.0)
MCV: 96.1 fL (ref 80.0–100.0)
Platelets: 178 10*3/uL (ref 150–400)
RBC: 4.38 MIL/uL (ref 3.87–5.11)
RDW: 14.9 % (ref 11.5–15.5)
WBC: 8.7 10*3/uL (ref 4.0–10.5)
nRBC: 0 % (ref 0.0–0.2)

## 2023-10-14 LAB — URINE CULTURE: Culture: NO GROWTH

## 2023-10-14 LAB — BASIC METABOLIC PANEL WITH GFR
Anion gap: 8 (ref 5–15)
BUN: 19 mg/dL (ref 8–23)
CO2: 26 mmol/L (ref 22–32)
Calcium: 9.4 mg/dL (ref 8.9–10.3)
Chloride: 101 mmol/L (ref 98–111)
Creatinine, Ser: 1.06 mg/dL — ABNORMAL HIGH (ref 0.44–1.00)
GFR, Estimated: 56 mL/min — ABNORMAL LOW (ref >60)
Glucose, Bld: 107 mg/dL — ABNORMAL HIGH (ref 70–99)
Potassium: 4.4 mmol/L (ref 3.5–5.1)
Sodium: 135 mmol/L (ref 135–145)

## 2023-10-14 LAB — PHOSPHORUS: Phosphorus: 4.2 mg/dL (ref 2.5–4.6)

## 2023-10-14 LAB — MAGNESIUM: Magnesium: 2.2 mg/dL (ref 1.7–2.4)

## 2023-10-14 MED ORDER — BISACODYL 10 MG RE SUPP: 10.0000 mg | Freq: Every day | RECTAL | Status: DC | PRN

## 2023-10-14 MED ORDER — POLYETHYLENE GLYCOL 3350 17 G PO PACK
17.0000 g | PACK | Freq: Two times a day (BID) | ORAL | Status: DC
  Administered 2023-10-14 – 2023-10-16 (×5): 17 g via ORAL
  Filled 2023-10-14 (×4): qty 1

## 2023-10-14 MED ORDER — OXYCODONE HCL 5 MG PO TABS
5.0000 mg | ORAL_TABLET | Freq: Four times a day (QID) | ORAL | Status: DC | PRN
  Administered 2023-10-14 – 2023-10-16 (×3): 5 mg via ORAL
  Filled 2023-10-14 (×3): qty 1

## 2023-10-14 MED ORDER — MECLIZINE HCL 25 MG PO TABS
25.0000 mg | ORAL_TABLET | Freq: Three times a day (TID) | ORAL | Status: DC | PRN
  Filled 2023-10-14: qty 1

## 2023-10-14 MED ORDER — QUETIAPINE FUMARATE 25 MG PO TABS
50.0000 mg | ORAL_TABLET | Freq: Every day | ORAL | Status: DC
  Administered 2023-10-14 – 2023-10-15 (×2): 50 mg via ORAL
  Filled 2023-10-14 (×2): qty 2

## 2023-10-14 MED ORDER — ALPRAZOLAM 0.5 MG PO TABS
0.5000 mg | ORAL_TABLET | Freq: Three times a day (TID) | ORAL | Status: DC | PRN
  Administered 2023-10-14: 0.5 mg via ORAL
  Filled 2023-10-14: qty 1

## 2023-10-14 MED ORDER — TRAZODONE HCL 50 MG PO TABS
125.0000 mg | ORAL_TABLET | Freq: Every day | ORAL | Status: DC
  Administered 2023-10-14 – 2023-10-15 (×2): 125 mg via ORAL
  Filled 2023-10-14 (×2): qty 1

## 2023-10-14 MED ORDER — BISACODYL 5 MG PO TBEC
10.0000 mg | DELAYED_RELEASE_TABLET | Freq: Every day | ORAL | Status: DC
  Administered 2023-10-14 – 2023-10-15 (×2): 10 mg via ORAL
  Filled 2023-10-14 (×2): qty 2

## 2023-10-14 NOTE — Progress Notes (Signed)
 [facility] Progress Note  Patient: Shelly Freeman    FIE:[id number]  DOA: 10/12/2023     Date of Service: the patient was seen and examined on 10/14/2023  Chief Complaint  Patient presents with   Abdominal Pain   Back Pain   Dysuria   Cystitis   Brief hospital course: Shelly Freeman is a 73 y.o. female with PMH of SLE, following rheumatology, not on immunosuppressive medication, last seen 5/8, osteoarthritis on buprenorphine , morbid obesity, being admitted for UTI, with persistent symptoms in spite of outpatient antibiotics for the past 3 days.  Her main complaint was worsening of left-sided back pain.  She also she also has nausea and a few episodes of vomiting. ED course and data review: Mildly tachypneic with otherwise normal vitals.  CBC and CMP normal except for mild elevation in creatinine to 1.06 above her baseline of 0.64.  Urine with positive nitrites.  Lipase and LFTs normal.  CT abdomen and pelvis showing large stool burden with no other acute findings.  No evidence of kidney stones. Patient treated with Dilaudid  IV fluids and started on Rocephin  Admission requested   Assessment and Plan:  # Abdominal pain could be due to constipation and ileus S/p enema, patient moved bowels 6/9 KUB shows increased colonic gas possible ileus Change diet to full liquid, continue to monitor May need to repeat CT scan if no improvement by tomorrow Will consider general surgery consult if her condition does not improve  Continue laxatives MiraLAX  twice daily, Dulcolax 10 mg nightly and Dulcolax suppository as needed   # Suspected UTI ruled out  UA nitrate positive, bacteria none, WBC 21-50 S/p Rocephin , Urine culture no growth   # AKI (acute kidney injury) Baseline creatinine 0.64, Scr 1.06 IV hydration, monitor and avoid nephrotoxins     # Chronic pain Multimodal pain control Continue home opiates   # History of nephrolithiasis No evidence of nephrolithiasis on imaging    # Hypotension due to medication # Essential hypertension Patient was hypertensive on admission but had a drop in blood pressure with Dilaudid  Monitor blood pressure closely with ongoing pain meds Hold home antihypertensives   # COPD (chronic obstructive pulmonary disease)  Not acutely exacerbated Continue home inhalers with DuoNebs as needed   # Hypothyroidism Continue levothyroxine    # Sleep apnea Does not use CPAP  # Depression and anxiety: Continued home meds BuSpar  15 mg p.o. twice daily, Lexapro  20 mg p.o. daily, Seroquel  50 mg nightly and trazodone  125 mg nightly   Body mass index is 40.81 kg/m.  Interventions:  Diet: Full liquid diet DVT Prophylaxis: Subcutaneous Lovenox    Advance goals of care discussion: DNR-intervene  Family Communication: family was present at bedside, at the time of interview.  The pt provided permission to discuss medical plan with the family. Opportunity was given to ask question and all questions were answered satisfactorily.   Disposition:  Pt is from home, admitted with abdominal pain, still has abdominal pain, which precludes a safe discharge. Discharge to home, when when stable, may need few days to improve.  Subjective: No significant events overnight, patient still has abdominal pain, does not feel much improvement, feels nauseous no vomiting.  Denied any chest pain or palpitations.  Physical Exam: General: NAD, lying comfortably Appear in no distress, affect appropriate Eyes: PERRLA ENT: Oral Mucosa Clear, moist  Neck: no JVD,  Cardiovascular: S1 and S2 Present, no Murmur,  Respiratory: good respiratory effort, Bilateral Air entry equal and Decreased, no Crackles, no wheezes Abdomen:  Bowel Sound present, Soft and mild generalized tenderness,  Skin: no rashes Extremities: no Pedal edema, no calf tenderness Neurologic: without any new focal findings Gait not checked due to patient safety concerns  Vitals:   10/14/23 0349  10/14/23 0830 10/14/23 1438 10/14/23 1611  BP: (!) 102/59 (!) 123/54  131/60  Pulse: 76 68 80 68  Resp: 20 18  18   Temp: 98.3 F (36.8 C) 98 F (36.7 C)  98.4 F (36.9 C)  TempSrc:      SpO2: 90% 95% 99% 94%  Weight:      Height:        Intake/Output Summary (Last 24 hours) at 10/14/2023 1749 Last data filed at 10/14/2023 1608 Gross per 24 hour  Intake 0 ml  Output 1550 ml  Net -1550 ml   Filed Weights   10/12/23 1730  Weight: 98 kg    Data Reviewed: I have personally reviewed and interpreted daily labs, tele strips, imagings as discussed above. I reviewed all nursing notes, pharmacy notes, vitals, pertinent old records I have discussed plan of care as described above with RN and patient/family.  CBC: Recent Labs  Lab 10/12/23 1733 10/14/23 1012  WBC 9.1 8.7  HGB 13.3 13.4  HCT 43.4 42.1  MCV 98.4 96.1  PLT 211 178   Basic Metabolic Panel: Recent Labs  Lab 10/12/23 1733 10/14/23 1012  NA 138 135  K 5.0 4.4  CL 101 101  CO2 27 26  GLUCOSE 82 107*  BUN 27* 19  CREATININE 1.06* 1.06*  CALCIUM  10.1 9.4  MG  --  2.2  PHOS  --  4.2    Studies: DG Abd 1 View Result Date: 10/14/2023 CLINICAL DATA:  Constipation.  Abdominal pain. EXAM: ABDOMEN - 1 VIEW COMPARISON:  CT 2 days ago FINDINGS: Diminished colonic stool burden from recent CT. Small volume of stool persists in the descending colon. There is increased air within the ascending, transverse, and rectosigmoid colon. Few dilated loops of small bowel centrally measure up to 3.4 cm. No evidence of free air in the supine views. Postsurgical change in the upper abdomen. IMPRESSION: 1. Diminished colonic stool burden from recent CT. Small volume of stool persists in the descending colon. 2. Increased air within the ascending, transverse, and rectosigmoid colon. Few dilated loops of small bowel centrally. Findings may represent ileus. Electronically Signed   By: Chadwick Colonel M.D.   On: 10/14/2023 17:20    Scheduled  Meds:  atorvastatin   40 mg Oral QHS   bisacodyl   10 mg Oral QHS   buprenorphine   1 patch Transdermal Weekly   busPIRone   15 mg Oral BID   conjugated estrogens  1 Applicatorful Vaginal Q3 days   cyanocobalamin   1,000 mcg Oral Daily   enoxaparin  (LOVENOX ) injection  0.5 mg/kg Subcutaneous Q24H   escitalopram   20 mg Oral Daily   ferrous sulfate   325 mg Oral Q M,W,F   levothyroxine   100 mcg Oral Q0600   lidocaine   1 patch Transdermal Q24H   pantoprazole   20 mg Oral BID   polyethylene glycol  17 g Oral BID   QUEtiapine   50 mg Oral QHS   traZODone   125 mg Oral QHS   Continuous Infusions:  cefTRIAXone  (ROCEPHIN )  IV 1 g (10/13/23 2247)   PRN Meds: acetaminophen  **OR** acetaminophen , albuterol , ALPRAZolam, bisacodyl , camphor-menthol , meclizine, morphine  injection, ondansetron  **OR** ondansetron  (ZOFRAN ) IV, oxyCODONE , phenazopyridine  Time spent: 55 minutes  Author: Althia Atlas. MD [facility] 10/14/2023 5:49 PM  To  reach On-call, see care teams to locate the attending and reach out to them via [email or web address]. If 7PM-7AM, please contact night-coverage If you still have difficulty reaching the attending provider, please page the [facility] (Director on Call) for [facility] on amion for assistance.

## 2023-10-14 NOTE — Progress Notes (Signed)
 Occupational Therapy Treatment Patient Details Name: Shelly Freeman MRN: [id number] DOB: 01-Aug-1950 Today's Date: 10/14/2023   History of present illness Pt is a 73 y/o F admitted on 10/12/23 after presenting with c/o worsening L sided back pain. Pt is being treated for UTI. PMH: SLE, OA, morbid obesity, anginal pain, asthma, CAD, cerebral microvascular disease, chronic pain syndrome, lumbar DDD, depression, hiatal hernia, HLD, HTN   OT comments  Pt seen for OT tx. Pt endorses significant pain, not controlled with recent morphine . Pt notes that she urinated in the bed. Pt required MOD A for sup>sit EOB and CGA with RW for STS. Set up with CGA for pericare in standing. Clean chux pad placed in bed. Pt endorsing significant pain, headache/neck pain and low back pain radiating down LLE. Returned to bed with MIN A for BLE mgt. Pt educated in PLB during session to support pain mgt and anxiety. Pt appears somewhat confused this date. Pt spoke with interpreter as if she knew her mother. RN notified. Pt continues to benefit from skilled OT services.       If plan is discharge home, recommend the following:  A little help with walking and/or transfers;A lot of help with bathing/dressing/bathroom;Assist for transportation;Assistance with cooking/housework;Help with stairs or ramp for entrance   Equipment Recommendations  None recommended by OT    Recommendations for Other Services      Precautions / Restrictions Precautions Precautions: Fall Recall of Precautions/Restrictions: Intact Restrictions Weight Bearing Restrictions Per Provider Order: No       Mobility Bed Mobility Overal bed mobility: Needs Assistance Bed Mobility: Sit to Supine, Supine to Sit     Supine to sit: Mod assist, HOB elevated, Used rails Sit to supine: Min assist   General bed mobility comments: increased time/effort, pain limited, MIN A for BLE back to bed    Transfers Overall transfer level: Needs  assistance Equipment used: Rolling walker (2 wheels) Transfers: Sit to/from Stand Sit to Stand: Contact guard assist           General transfer comment: CGA for safety     Balance Overall balance assessment: Needs assistance Sitting-balance support: Feet supported, Single extremity supported, Bilateral upper extremity supported Sitting balance-Leahy Scale: Fair     Standing balance support: During functional activity, Bilateral upper extremity supported, Reliant on assistive device for balance Standing balance-Leahy Scale: Fair                             ADL either performed or assessed with clinical judgement   ADL Overall ADL's : Needs assistance/impaired                           Toilet Transfer Details (indicate cue type and reason): Pt knowingly urinated in the bed, cites lack of assist available earlier Toileting- Architect and Hygiene: Sit to/from stand;Contact guard assist;Set up Toileting - Clothing Manipulation Details (indicate cue type and reason): with LUE on RW and CGA for standing balance/safety, pt setup for completing pericare/cleaning. Pt visually appeared to be in pain completing but was able to complete without direct assist            Extremity/Trunk Assessment              Vision       Perception     Praxis     Communication Communication Communication: Impaired;Other (comment) Factors Affecting Communication: Non - [place]  speaking, interpreter not available (in person Spanish interpreter utilized, Systems developer)   Cognition Arousal: Alert Behavior During Therapy: Anxious Cognition: No family/caregiver present to determine baseline             OT - Cognition Comments: pt confused, thought interpreter was someone she knew                 Following commands: Impaired Following commands impaired: Follows one step commands with increased time      Cueing   Cueing Techniques: Verbal cues,  Visual cues  Exercises Other Exercises Other Exercises: Pt instructed in PLB to support anxiety, pain    Shoulder Instructions       General Comments Pt on RA throughout    Pertinent Vitals/ Pain       Pain Assessment Pain Assessment: Faces Faces Pain Scale: Hurts whole lot Pain Location: low back, radiating down LLE and neck, headache Pain Descriptors / Indicators: Aching, Moaning, Grimacing, Guarding, Headache, Sharp, Shooting Pain Intervention(s): Limited activity within patient's tolerance, Monitored during session, Premedicated before session, Repositioned, Patient requesting pain meds-RN notified  Home Living                                          Prior Functioning/Environment              Frequency  Min 2X/week        Progress Toward Goals  OT Goals(current goals can now be found in the care plan section)  Progress towards OT goals: Progressing toward goals  Acute Rehab OT Goals Patient Stated Goal: feel better OT Goal Formulation: With patient Time For Goal Achievement: 10/26/23 Potential to Achieve Goals: Good  Plan      Co-evaluation                 AM-PAC OT "6 Clicks" Daily Activity     Outcome Measure   Help from another person eating meals?: None Help from another person taking care of personal grooming?: A Little Help from another person toileting, which includes using toliet, bedpan, or urinal?: A Little Help from another person bathing (including washing, rinsing, drying)?: A Lot Help from another person to put on and taking off regular upper body clothing?: A Little Help from another person to put on and taking off regular lower body clothing?: A Lot 6 Click Score: 17    End of Session Equipment Utilized During Treatment: Rolling walker (2 wheels)  OT Visit Diagnosis: Other abnormalities of gait and mobility (R26.89);Pain;Muscle weakness (generalized) (M62.81) Pain - Right/Left: Left Pain - part of body:  Hip;Leg   Activity Tolerance Patient tolerated treatment well;Patient limited by pain   Patient Left in bed;with call bell/phone within reach;with bed alarm set   Nurse Communication Mobility status;Patient requests pain meds        Time: 1530-1557 OT Time Calculation (min): 27 min  Charges: OT General Charges $OT Visit: 1 Visit OT Treatments $Self Care/Home Management : 23-37 mins  Berenda Breaker., MPH, MS, OTR/L ascom [phone] 10/14/23, 4:54 PM

## 2023-10-14 NOTE — Progress Notes (Signed)
 Physical Therapy Treatment Patient Details Name: Shelly Freeman MRN: [id number] DOB: [birth year] Today's Date: 10/14/2023   History of Present Illness Pt is a 73 y/o F admitted on 10/12/23 after presenting with c/o worsening L sided back pain. Pt is being treated for UTI. PMH: SLE, OA, morbid obesity, anginal pain, asthma, CAD, cerebral microvascular disease, chronic pain syndrome, lumbar DDD, depression, hiatal hernia, HLD, HTN    PT Comments  Patient is agreeable to PT. She seems anxious throughout session. Short distance ambulation performed with rolling walker with CGA for safety. No loss of balance with mobility. Cues for breathing techniques with carry over demonstrated. Recommend to continue PT to maximize independence and facilitate return to prior level of function. Consider rehabilitation < 3 hours/day after this hospital stay.    If plan is discharge home, recommend the following: A little help with walking and/or transfers;A little help with bathing/dressing/bathroom;Assistance with cooking/housework;Assist for transportation;Help with stairs or ramp for entrance   Can travel by private vehicle     Yes  Equipment Recommendations  None recommended by PT    Recommendations for Other Services       Precautions / Restrictions Precautions Precautions: Fall Restrictions Weight Bearing Restrictions Per Provider Order: No     Mobility  Bed Mobility Overal bed mobility: Needs Assistance Bed Mobility: Sit to Supine, Supine to Sit     Supine to sit: Mod assist Sit to supine: Contact guard assist   General bed mobility comments: cues for sequencing and technique    Transfers Overall transfer level: Needs assistance Equipment used: Rolling walker (2 wheels) Transfers: Sit to/from Stand Sit to Stand: Contact guard assist           General transfer comment: CGA for safety for standing from bed and from toilet. cues for safety provided with reinforcement to use rolling  walker    Ambulation/Gait Ambulation/Gait assistance: Contact guard assist Gait Distance (Feet):  (35ft, 82ft) Assistive device: Rolling walker (2 wheels) Gait Pattern/deviations: Step-through pattern Gait velocity: decreased     General Gait Details: cues for breathing techniques as patient anxious with activity. no loss of balance with mobility   Stairs             Wheelchair Mobility     Tilt Bed    Modified Rankin (Stroke Patients Only)       Balance Overall balance assessment: Needs assistance Sitting-balance support: Feet supported Sitting balance-Leahy Scale: Fair     Standing balance support: During functional activity, Bilateral upper extremity supported, Reliant on assistive device for balance Standing balance-Leahy Scale: Fair                              Hotel manager: No apparent difficulties  Cognition Arousal: Alert Behavior During Therapy: Anxious   PT - Cognitive impairments: Difficult to assess, Safety/Judgement Difficult to assess due to: Impaired communication, Non-English speaking (anxious throughout session)                     PT - Cognition Comments: patient attempting to get out of bed on arrival to room. out of safety concerns, unable to get Ipad for interpreter in time for mobility. patient is able to follow single step commands in English Following commands: Impaired Following commands impaired: Follows one step commands with increased time    Cueing Cueing Techniques: Verbal cues, Visual cues  Exercises      General Comments  Pertinent Vitals/Pain Pain Assessment Pain Assessment: Faces Faces Pain Scale: Hurts little more Pain Location: abdomen Pain Descriptors / Indicators: Discomfort Pain Intervention(s): Limited activity within patient's tolerance    Home Living                          Prior Function            PT Goals (current goals can now  be found in the care plan section) Acute Rehab PT Goals PT Goal Formulation: With patient Time For Goal Achievement: 10/27/23 Potential to Achieve Goals: Good Progress towards PT goals: Progressing toward goals    Frequency    Min 2X/week      PT Plan      Co-evaluation              AM-PAC PT "6 Clicks" Mobility   Outcome Measure  Help needed turning from your back to your side while in a flat bed without using bedrails?: A Lot Help needed moving from lying on your back to sitting on the side of a flat bed without using bedrails?: A Lot Help needed moving to and from a bed to a chair (including a wheelchair)?: A Little Help needed standing up from a chair using your arms (e.g., wheelchair or bedside chair)?: A Little Help needed to walk in hospital room?: A Lot Help needed climbing 3-5 steps with a railing? : Total 6 Click Score: 13    End of Session   Activity Tolerance: Patient tolerated treatment well (limited by anxiety) Patient left: in bed;with call bell/phone within reach;with bed alarm set Nurse Communication: Mobility status PT Visit Diagnosis: Pain;Muscle weakness (generalized) (M62.81);Difficulty in walking, not elsewhere classified (R26.2)     Time: 1610-9604 PT Time Calculation (min) (ACUTE ONLY): 15 min  Charges:    $Therapeutic Activity: 8-22 mins PT General Charges $$ ACUTE PT VISIT: 1 Visit                     [name], PT, MPT    Erlene Hawks 10/14/2023, 3:18 PM

## 2023-10-14 NOTE — TOC Initial Note (Signed)
 Transition of Care [facility]) - Initial/Assessment Note    Patient Details  Name: Shelly Freeman MRN: [id number] Date of Birth: November 19, 1950  Transition of Care [facility]) CM/SW Contact:    Loman Risk, RN Phone Number: 10/14/2023, 4:26 PM  Clinical Narrative:                  Met with patient at bedside using stratus interpreter Patient lives at home alone.  Therapy recommends SNF Patient declines SNF. She states that she already received Therapy 5 days a week at [facility]. [name] with [facility] notified      Patient Goals and CMS Choice            Expected Discharge Plan and Services                                              Prior Living Arrangements/Services                       Activities of Daily Living   ADL Screening (condition at time of admission) Independently performs ADLs?: No Does the patient have a NEW difficulty with bathing/dressing/toileting/self-feeding that is expected to last >3 days?: No Does the patient have a NEW difficulty with getting in/out of bed, walking, or climbing stairs that is expected to last >3 days?: Yes (Initiates electronic notice to provider for possible PT consult) Does the patient have a NEW difficulty with communication that is expected to last >3 days?: No Is the patient deaf or have difficulty hearing?: Yes Does the patient have difficulty seeing, even when wearing glasses/contacts?: No Does the patient have difficulty concentrating, remembering, or making decisions?: No  Permission Sought/Granted                  Emotional Assessment              Admission diagnosis:  Urinary tract infection [N39.0] UTI (urinary tract infection) [N39.0] Patient Active Problem List   Diagnosis Date Noted   Urinary tract infection 10/12/2023   S/P TKR (total knee replacement) using cement, 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   Bilateral pneumonia 08/29/2020   AKI (acute kidney injury) (HCC) 08/29/2020   COPD (chronic obstructive pulmonary disease) (HCC) 08/29/2020   Fall at home, initial encounter 08/29/2020   CAP (community acquired pneumonia) 08/29/2020   Osteoarthritis of left knee 10/01/2019   Diarrhea 09/17/2019   Dizziness 09/17/2019   Other fatigue 06/09/2019   Shortness of breath 06/09/2019   Obesity, Class III, BMI 40-49.9 (morbid obesity) 05/23/2019   UTI (urinary tract infection)  05/23/2019   Right rib fracture 05/23/2019   SIRS (systemic inflammatory response syndrome) (HCC) 05/23/2019   Bacteremia due to Gram-negative bacteria 05/23/2019   Chest pain at rest 05/19/2019   Pain in rib 05/19/2019   Acute bronchiolitis 01/28/2019   Iron deficiency anemia due to chronic blood loss 10/29/2018   Primary osteoarthritis involving multiple joints 10/29/2018   Right carpal tunnel syndrome 07/31/2017   Cervical radiculopathy 07/16/2017   Left anterior knee pain 10/24/2016   Right upper quadrant pain 10/24/2016   Urination pain 06/26/2016   DDD (degenerative disc disease), cervical 05/14/2016   Lipoma of skin 05/14/2016   Vitamin D  deficiency 05/14/2016   Major neurocognitive disorder, due  to vascular disease, with behavioral disturbance, mild (HCC) 04/15/2016   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/04/2016   Lupus 04/04/2016   GERD (gastroesophageal reflux disease) 04/04/2016   COPD with acute bronchitis (HCC) 04/04/2016   Acute respiratory failure with hypoxia (HCC) 04/03/2016   Encephalopathy, metabolic 04/03/2016   Hypokalemia 04/03/2016   Pain in limb 04/03/2016   Hypothyroidism 04/03/2016   Essential hypertension 04/03/2016   Intentional opiate overdose (HCC) 03/30/2016   Age related osteoporosis 03/19/2016   Right arm pain 03/19/2016   Encounter for long-term (current) use of high-risk medication 03/19/2016   Primary osteoarthritis of both knees 03/19/2016   PCP:  Izella Marshal, Alissa April, DO Pharmacy:   [facility] - [place], [place] - [address] [place] [address] Phone: [phone] Fax: [phone]     Social Drivers of Health (SDOH) Social History: SDOH Screenings   Food Insecurity: No Food Insecurity (10/12/2023)  Housing: Low Risk  (10/12/2023)  Transportation Needs: No Transportation Needs (10/12/2023)  Utilities: Not At Risk (10/12/2023)  Alcohol  Screen: Low Risk  (12/09/2017)  Financial Resource Strain: Low Risk  (07/19/2023)   Received from [facility]  Social Connections: Moderately Integrated (10/12/2023)  Tobacco Use: Low Risk  (10/12/2023)   SDOH Interventions:     Readmission Risk Interventions    01/28/2021   12:19 PM  Readmission Risk Prevention Plan  Transportation Screening Complete  PCP or Specialist Appt within 3-5 Days Complete  HRI or Home Care Consult Complete  Social Work Consult for Recovery Care Planning/Counseling Complete  Palliative Care Screening Not Applicable  Medication Review Oceanographer) Complete

## 2023-10-14 NOTE — NC FL2 (Signed)
 [place]  MEDICAID [id number] LEVEL OF CARE FORM     IDENTIFICATION  Patient Name: Shelly Freeman Birthdate: [birth year] Sex: female Admission Date (Current Location): 10/12/2023  [facility] [place] and [place] Number:  Chiropodist and Address:         Provider Number: [id number]  Attending Physician Name and Address:  Althia Atlas, MD  Relative Name and Phone Number:       Current Level of Care: Hospital Recommended Level of Care: Skilled Nursing Facility Prior Approval Number:    Date Approved/Denied:   PASRR Number:    Discharge Plan: Home    Current Diagnoses: Patient Active Problem List   Diagnosis Date Noted   Urinary tract infection 10/12/2023   S/P TKR (total knee replacement) using cement, right 04/25/2023   Chronic pain syndrome 02/13/2023   Lumbar radicular pain, acute on chronic, intractable 02/10/2023   History of nephrolithiasis 02/10/2023   Hypotension due to medication 02/10/2023   Intractable pain 02/10/2023   Sleep apnea    Cystitis 09/13/2022   History of ESBL E. 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   Bilateral pneumonia 08/29/2020   AKI (acute kidney injury) (HCC) 08/29/2020   COPD (chronic obstructive pulmonary disease) (HCC) 08/29/2020   Fall at home, initial encounter 08/29/2020   CAP (community acquired pneumonia) 08/29/2020   Osteoarthritis of left knee 10/01/2019   Diarrhea 09/17/2019   Dizziness 09/17/2019   Other fatigue  06/09/2019   Shortness of breath 06/09/2019   Obesity, Class III, BMI 40-49.9 (morbid obesity) 05/23/2019   UTI (urinary tract infection) 05/23/2019   Right rib fracture 05/23/2019   SIRS (systemic inflammatory response syndrome) (HCC) 05/23/2019   Bacteremia due to Gram-negative bacteria 05/23/2019   Chest pain at rest 05/19/2019   Pain in rib 05/19/2019   Acute bronchiolitis 01/28/2019   Iron deficiency anemia due to chronic blood loss 10/29/2018   Primary osteoarthritis involving multiple joints 10/29/2018   Right carpal tunnel syndrome 07/31/2017   Cervical radiculopathy 07/16/2017   Left anterior knee pain 10/24/2016   Right upper quadrant pain 10/24/2016   Urination pain 06/26/2016   DDD (degenerative disc disease), cervical 05/14/2016   Lipoma of skin 05/14/2016   Vitamin D  deficiency 05/14/2016   Major neurocognitive disorder, due to vascular disease, with behavioral disturbance, mild (HCC) 04/15/2016   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/04/2016   Lupus 04/04/2016   GERD (gastroesophageal reflux disease) 04/04/2016   COPD with acute bronchitis (HCC) 04/04/2016   Acute respiratory failure with hypoxia (HCC) 04/03/2016   Encephalopathy, metabolic 04/03/2016   Hypokalemia 04/03/2016   Pain in limb 04/03/2016   Hypothyroidism 04/03/2016   Essential hypertension 04/03/2016   Intentional opiate overdose (HCC) 03/30/2016   Age related osteoporosis 03/19/2016   Right arm pain 03/19/2016   Encounter for long-term (current) use of high-risk medication 03/19/2016   Primary osteoarthritis of both knees 03/19/2016    Orientation RESPIRATION BLADDER Height & Weight     Self, Time, Situation, Place  Normal Incontinent Weight:  98 kg Height:  5\' 1"  (154.9 cm)  BEHAVIORAL SYMPTOMS/MOOD NEUROLOGICAL BOWEL NUTRITION STATUS      Continent Diet (heart healthy)  AMBULATORY STATUS COMMUNICATION OF NEEDS Skin   Extensive Assist Verbally Normal                        Personal Care Assistance Level of Assistance              Functional Limitations Info             SPECIAL CARE FACTORS FREQUENCY  PT (By licensed PT), OT (By licensed OT)                    Contractures Contractures Info: Not present    Additional Factors Info  Code Status, Isolation Precautions, Allergies Code Status Info: DNR Allergies Info: cholestyramine           Current Medications (10/14/2023):  This is the current hospital active medication list Current Facility-Administered Medications  Medication Dose Route Frequency Provider Last Rate Last Admin   acetaminophen  (TYLENOL ) tablet 650 mg  650 mg Oral Q6H PRN Duncan, Hazel V, MD       Or   acetaminophen  (TYLENOL ) suppository 650 mg  650 mg Rectal Q6H PRN Lanetta Pion, MD       albuterol  (PROVENTIL ) (2.5 MG/3ML) 0.083% nebulizer solution 2.5 mg  2.5 mg Nebulization Q6H PRN Ezzard Holms, MD       ALPRAZolam (XANAX) tablet 0.5 mg  0.5 mg Oral TID PRN Althia Atlas, MD   0.5 mg at 10/14/23 1447   atorvastatin  (LIPITOR) tablet 40 mg  40 mg Oral QHS Duncan, Hazel V, MD   40 mg at 10/13/23 2232   bisacodyl  (DULCOLAX) EC tablet 10 mg  10 mg Oral QHS Althia Atlas, MD       bisacodyl  (DULCOLAX) suppository 10 mg  10 mg Rectal Daily PRN Althia Atlas, MD       buprenorphine  (BUTRANS ) 7.5 MCG/HR 1 patch  1 patch Transdermal Weekly Lanetta Pion, MD   1 patch at 10/13/23 0024   busPIRone  (BUSPAR ) tablet 15 mg  15 mg Oral BID Duncan, Hazel V, MD   15 mg at 10/14/23 0845   camphor-menthol  (SARNA) lotion   Topical PRN Chatterjee, Srobona Tublu, MD       cefTRIAXone  (ROCEPHIN ) 1 g in sodium chloride  0.9 % 100 mL IVPB  1 g Intravenous Q24H Duncan, Hazel V, MD 200 mL/hr at 10/13/23 2247 1 g at 10/13/23 2247   conjugated estrogens (PREMARIN) vaginal cream 1 Applicatorful  1 Applicatorful Vaginal Q3 days Chatterjee, Srobona Tublu, MD   1 Applicatorful at 10/13/23 2232   cyanocobalamin  (VITAMIN B12) tablet  1,000 mcg  1,000 mcg Oral Daily Duncan, Hazel V, MD   1,000 mcg at 10/14/23 0845   enoxaparin  (LOVENOX ) injection 50 mg  0.5 mg/kg Subcutaneous Q24H Duncan, Hazel V, MD   50 mg at 10/14/23 0845   escitalopram  (LEXAPRO ) tablet 20 mg  20 mg Oral Daily Duncan, Hazel V, MD   20 mg at 10/14/23 0845   ferrous sulfate  tablet 325 mg  325 mg Oral Q M,W,F Duncan, Hazel V, MD   325 mg at 10/14/23 0845   levothyroxine  (SYNTHROID ) tablet 100 mcg  100 mcg Oral Q0600 Lanetta Pion, MD   100 mcg at 10/14/23 0513   lidocaine  (LIDODERM ) 5 % 1 patch  1 patch Transdermal Q24H Lanetta Pion, MD  1 patch at 10/13/23 1191   meclizine (ANTIVERT) tablet 25 mg  25 mg Oral TID PRN Althia Atlas, MD       morphine  (PF) 2 MG/ML injection 1 mg  1 mg Intravenous Q4H PRN Chatterjee, Srobona Tublu, MD   1 mg at 10/14/23 1317   ondansetron  (ZOFRAN ) tablet 4 mg  4 mg Oral Q6H PRN Duncan, Hazel V, MD       Or   ondansetron  (ZOFRAN ) injection 4 mg  4 mg Intravenous Q6H PRN Duncan, Hazel V, MD   4 mg at 10/13/23 1734   pantoprazole  (PROTONIX ) EC tablet 20 mg  20 mg Oral BID Duncan, Hazel V, MD   20 mg at 10/14/23 0845   phenazopyridine (PYRIDIUM) tablet 200 mg  200 mg Oral TID WC PRN Chatterjee, Srobona Tublu, MD       polyethylene glycol (MIRALAX  / GLYCOLAX ) packet 17 g  17 g Oral BID Althia Atlas, MD   17 g at 10/14/23 0845   QUEtiapine  (SEROQUEL ) tablet 50 mg  50 mg Oral QHS Althia Atlas, MD       traZODone  (DESYREL ) tablet 125 mg  125 mg Oral QHS Althia Atlas, MD         Discharge Medications: Please see discharge summary for a list of discharge medications.  Relevant Imaging Results:  Relevant Lab Results:   Additional Information SS# [id number]  Loman Risk, RN

## 2023-10-14 NOTE — Care Management Obs Status (Signed)
 MEDICARE OBSERVATION STATUS NOTIFICATION   Patient Details  Name: Shelly Freeman MRN: [id number] Date of Birth: Nov 07, 1950   Medicare Observation Status Notification Given:  Yes    Anise Kerns 10/14/2023, 1:31 PM

## 2023-10-14 NOTE — Plan of Care (Signed)
   Problem: Education: Goal: Knowledge of General Education information will improve Description Including pain rating scale, medication(s)/side effects and non-pharmacologic comfort measures Outcome: Progressing

## 2023-10-15 ENCOUNTER — Inpatient Hospital Stay: Payer: Medicare (Managed Care)

## 2023-10-15 DIAGNOSIS — N10 Acute tubulo-interstitial nephritis: Secondary | ICD-10-CM | POA: Diagnosis not present

## 2023-10-15 LAB — CBC
HCT: 40.8 % (ref 36.0–46.0)
Hemoglobin: 13.2 g/dL (ref 12.0–15.0)
MCH: 31.1 pg (ref 26.0–34.0)
MCHC: 32.4 g/dL (ref 30.0–36.0)
MCV: 96.2 fL (ref 80.0–100.0)
Platelets: 179 10*3/uL (ref 150–400)
RBC: 4.24 MIL/uL (ref 3.87–5.11)
RDW: 14.7 % (ref 11.5–15.5)
WBC: 7.1 10*3/uL (ref 4.0–10.5)
nRBC: 0 % (ref 0.0–0.2)

## 2023-10-15 LAB — BASIC METABOLIC PANEL WITH GFR
Anion gap: 10 (ref 5–15)
BUN: 21 mg/dL (ref 8–23)
CO2: 24 mmol/L (ref 22–32)
Calcium: 9.2 mg/dL (ref 8.9–10.3)
Chloride: 105 mmol/L (ref 98–111)
Creatinine, Ser: 1.04 mg/dL — ABNORMAL HIGH (ref 0.44–1.00)
GFR, Estimated: 57 mL/min — ABNORMAL LOW (ref >60)
Glucose, Bld: 98 mg/dL (ref 70–99)
Potassium: 3.7 mmol/L (ref 3.5–5.1)
Sodium: 139 mmol/L (ref 135–145)

## 2023-10-15 LAB — PHOSPHORUS: Phosphorus: 5 mg/dL — ABNORMAL HIGH (ref 2.5–4.6)

## 2023-10-15 LAB — MAGNESIUM: Magnesium: 2.3 mg/dL (ref 1.7–2.4)

## 2023-10-15 MED ORDER — BISACODYL 10 MG RE SUPP
10.0000 mg | Freq: Once | RECTAL | Status: AC
  Administered 2023-10-15: 10 mg via RECTAL
  Filled 2023-10-15: qty 1

## 2023-10-15 MED ORDER — SODIUM CHLORIDE 0.9 % IV SOLN
1.0000 g | INTRAVENOUS | Status: AC
  Administered 2023-10-15: 1 g via INTRAVENOUS
  Filled 2023-10-15: qty 10

## 2023-10-15 NOTE — Progress Notes (Signed)
 [facility] Progress Note  Patient: Shelly Freeman    ZOX:[id number]  DOA: 10/12/2023     Date of Service: the patient was seen and examined on 10/15/2023  Chief Complaint  Patient presents with   Abdominal Pain   Back Pain   Dysuria   Cystitis   Brief hospital course: Jeannette Castell is a 73 y.o. female with PMH of SLE, following rheumatology, not on immunosuppressive medication, last seen 5/8, osteoarthritis on buprenorphine , morbid obesity, being admitted for UTI, with persistent symptoms in spite of outpatient antibiotics for the past 3 days.  Her main complaint was worsening of left-sided back pain.  She also she also has nausea and a few episodes of vomiting. ED course and data review: Mildly tachypneic with otherwise normal vitals.  CBC and CMP normal except for mild elevation in creatinine to 1.06 above her baseline of 0.64.  Urine with positive nitrites.  Lipase and LFTs normal.  CT abdomen and pelvis showing large stool burden with no other acute findings.  No evidence of kidney stones. Patient treated with Dilaudid  IV fluids and started on Rocephin  Admission requested   Assessment and Plan:  # Abdominal pain could be due to constipation and ileus S/p enema, patient moved bowels 6/9 KUB shows increased colonic gas possible ileus Change diet to full liquid, continue to monitor Diet advanced to soft diet, Dulcolax suppository order placed. Continue laxatives MiraLAX  twice daily, Dulcolax 10 mg nightly and Dulcolax suppository as needed 6/10 KUB shows resolution of ileus and no distention of colon. Patient is still having significant general abdominal pain, unknown cause.  Poor historian, patient is not passing gas.   # Suspected UTI ruled out  UA nitrate positive, bacteria none, WBC 21-50 S/p Rocephin  x 3 days, Urine culture no growth   # AKI (acute kidney injury) Baseline creatinine 0.64, Scr 1.06 IV hydration, monitor and avoid nephrotoxins     # Chronic  pain Multimodal pain control Continue home opiates   # History of nephrolithiasis No evidence of nephrolithiasis on imaging   # Hypotension due to medication # Essential hypertension Patient was hypertensive on admission but had a drop in blood pressure with Dilaudid  Monitor blood pressure closely with ongoing pain meds Hold home antihypertensives   # COPD (chronic obstructive pulmonary disease)  Not acutely exacerbated Continue home inhalers with DuoNebs as needed   # Hypothyroidism Continue levothyroxine    # Sleep apnea Does not use CPAP  # Depression and anxiety: Continued home meds BuSpar  15 mg p.o. twice daily, Lexapro  20 mg p.o. daily, Seroquel  50 mg nightly and trazodone  125 mg nightly   Body mass index is 40.81 kg/m.  Interventions:  Diet: Full liquid diet DVT Prophylaxis: Subcutaneous Lovenox    Advance goals of care discussion: DNR-intervene  Family Communication: family was present at bedside, at the time of interview.  The pt provided permission to discuss medical plan with the family. Opportunity was given to ask question and all questions were answered satisfactorily.   Disposition:  Pt is from home, admitted with abdominal pain, still has abdominal pain, which precludes a safe discharge. Discharge to [facility] with [facility], when stable, most likely in 1 to 2 days  Subjective: No significant events overnight, patient is poor historian, still complaining of generalized abdominal pain, no nausea vomiting.  Patient is not passing gas and no BM yet.  X-ray shows improvement of ileus.  We will advance the diet and give 1 Dulcolax suppository and follow along.  Physical Exam: General:  NAD, lying comfortably Appear in no distress, affect appropriate Eyes: PERRLA ENT: Oral Mucosa Clear, moist  Neck: no JVD,  Cardiovascular: S1 and S2 Present, no Murmur,  Respiratory: good respiratory effort, Bilateral Air entry equal and Decreased, no Crackles, no  wheezes Abdomen: Bowel Sound present, Soft and mild generalized tenderness,  Skin: no rashes Extremities: no Pedal edema, no calf tenderness Neurologic: without any new focal findings Gait not checked due to patient safety concerns  Vitals:   10/14/23 2028 10/15/23 0421 10/15/23 0756 10/15/23 1616  BP: 115/66 111/64 (!) 106/56 110/61  Pulse: 76 77 78 78  Resp: 16 16 16  (!) 22  Temp: 98.3 F (36.8 C) 97.9 F (36.6 C) 98.3 F (36.8 C) 98.3 F (36.8 C)  TempSrc: Oral Oral Oral   SpO2: 91% (!) 89% 91% 90%  Weight:      Height:        Intake/Output Summary (Last 24 hours) at 10/15/2023 1703 Last data filed at 10/15/2023 1300 Gross per 24 hour  Intake 0 ml  Output --  Net 0 ml   Filed Weights   10/12/23 1730  Weight: 98 kg    Data Reviewed: I have personally reviewed and interpreted daily labs, tele strips, imagings as discussed above. I reviewed all nursing notes, pharmacy notes, vitals, pertinent old records I have discussed plan of care as described above with RN and patient/family.  CBC: Recent Labs  Lab 10/12/23 1733 10/14/23 1012 10/15/23 0444  WBC 9.1 8.7 7.1  HGB 13.3 13.4 13.2  HCT 43.4 42.1 40.8  MCV 98.4 96.1 96.2  PLT 211 178 179   Basic Metabolic Panel: Recent Labs  Lab 10/12/23 1733 10/14/23 1012 10/15/23 0444  NA 138 135 139  K 5.0 4.4 3.7  CL 101 101 105  CO2 27 26 24   GLUCOSE 82 107* 98  BUN 27* 19 21  CREATININE 1.06* 1.06* 1.04*  CALCIUM  10.1 9.4 9.2  MG  --  2.2 2.3  PHOS  --  4.2 5.0*    Studies: DG Abd 1 View Result Date: 10/15/2023 CLINICAL DATA:  Ileus. EXAM: ABDOMEN - 1 VIEW COMPARISON:  October 14, 2023. FINDINGS: The bowel gas pattern is normal. No radio-opaque calculi or other significant radiographic abnormality are seen. IMPRESSION: No significant abnormal bowel dilatation. Electronically Signed   By: Rosalene Colon M.D.   On: 10/15/2023 10:26    Scheduled Meds:  atorvastatin   40 mg Oral QHS   bisacodyl   10 mg Oral QHS    buprenorphine   1 patch Transdermal Weekly   busPIRone   15 mg Oral BID   conjugated estrogens  1 Applicatorful Vaginal Q3 days   cyanocobalamin   1,000 mcg Oral Daily   enoxaparin  (LOVENOX ) injection  0.5 mg/kg Subcutaneous Q24H   escitalopram   20 mg Oral Daily   ferrous sulfate   325 mg Oral Q M,W,F   levothyroxine   100 mcg Oral Q0600   lidocaine   1 patch Transdermal Q24H   pantoprazole   20 mg Oral BID   polyethylene glycol  17 g Oral BID   QUEtiapine   50 mg Oral QHS   traZODone   125 mg Oral QHS   Continuous Infusions:  cefTRIAXone  (ROCEPHIN )  IV 1 g (10/14/23 2143)   PRN Meds: acetaminophen  **OR** acetaminophen , albuterol , ALPRAZolam, bisacodyl , camphor-menthol , meclizine, morphine  injection, ondansetron  **OR** ondansetron  (ZOFRAN ) IV, oxyCODONE , phenazopyridine  Time spent: 40 minutes  Author: Althia Atlas. MD [facility] 10/15/2023 5:03 PM  To reach On-call, see care teams to locate the  attending and reach out to them via [email or web address]. If 7PM-7AM, please contact night-coverage If you still have difficulty reaching the attending provider, please page the [facility] (Director on Call) for [facility] on amion for assistance.

## 2023-10-15 NOTE — Plan of Care (Signed)

## 2023-10-16 DIAGNOSIS — K5641 Fecal impaction: Secondary | ICD-10-CM | POA: Diagnosis not present

## 2023-10-16 DIAGNOSIS — E66813 Obesity, class 3: Secondary | ICD-10-CM

## 2023-10-16 DIAGNOSIS — N179 Acute kidney failure, unspecified: Secondary | ICD-10-CM | POA: Diagnosis not present

## 2023-10-16 MED ORDER — LACTULOSE 10 GM/15ML PO SOLN: 20.0000 g | Freq: Two times a day (BID) | ORAL | 0 refills | Status: AC | PRN

## 2023-10-16 MED ORDER — LACTULOSE 10 GM/15ML PO SOLN
20.0000 g | Freq: Once | ORAL | Status: AC
  Administered 2023-10-16: 20 g via ORAL
  Filled 2023-10-16: qty 30

## 2023-10-16 NOTE — Discharge Summary (Signed)
 Physician Discharge Summary   Patient: Shelly Freeman MRN: [id number] DOB: [birth year]  Admit date:     10/12/2023  Discharge date: 10/16/23  Discharge Physician: Donaciano Frizzle   PCP: Izella Marshal, Jill E, DO   Recommendations at discharge:    Follow up with PCP in 1 week. Has appointment today  Discharge Diagnoses: Principal Problem:   Urinary tract infection Active Problems:   AKI (acute kidney injury) (HCC)   COPD (chronic obstructive pulmonary disease) (HCC)   Hypothyroidism   Obesity, Class III, BMI 40-49.9 (morbid obesity)   UTI (urinary tract infection)   Depression with anxiety  Resolved Problems:   * No resolved hospital problems. *  Hospital Course: Shelly Freeman is a 73 y.o. female with PMH of SLE, following rheumatology, not on immunosuppressive medication, last seen 5/8, osteoarthritis on buprenorphine , morbid obesity, being admitted for UTI, with persistent symptoms in spite of outpatient antibiotics for the past 3 days.  Her main complaint was worsening of left-sided back pain.  She also she also has nausea and a few episodes of vomiting. ED course and data review: Mildly tachypneic with otherwise normal vitals.  CBC and CMP normal except for mild elevation in creatinine to 1.06 above her baseline of 0.64.  Urine with positive nitrites.  Lipase and LFTs normal.  CT abdomen and pelvis showing large stool burden with no other acute findings.  No evidence of kidney stones. Patient treated with Dilaudid  IV fluids and started on Rocephin  Urine culture came back no growth, UTI ruled out.  Patient also had a secondary constipation, fecal impaction on CT scan, had a large bowel movements after giving stool softener.  Repeat KUB has resolved.  Currently patient is medically stable for discharge.  Assessment and Plan: # Abdominal pain due to constipation and ileus Fecal impaction. S/p enema, patient moved bowels 6/9 KUB shows increased colonic gas possible ileus Repeat KUB on  6/10 with no bowel obstruction or dilation. Condition has improved, abdominal pain improved.  We will continue as needed stool softener at home.     # Suspected UTI ruled out  UA nitrate positive, bacteria none, WBC 21-50 S/p Rocephin  x 3 days, Urine culture no growth   # AKI (acute kidney injury) Baseline creatinine 0.64, Scr 1.06 Creatinine still 1.04 after fluids, this may be patient's new baseline.  Follow-up with PCP as outpatient.     # Chronic pain Multimodal pain control Continue home opiates   # History of nephrolithiasis No evidence of nephrolithiasis on imaging   # Hypotension due to medication # Essential hypertension Patient was hypertensive on admission but had a drop in blood pressure with Dilaudid  Looks like the patient was not taking any blood pressure medicine at home, blood pressure is better.  Not restarting any blood pressure medicines.   # COPD (chronic obstructive pulmonary disease)  Not acutely exacerbated Continue home inhalers with DuoNebs as needed   # Hypothyroidism Continue levothyroxine    # Obstructive sleep apnea Does not use CPAP   # Depression and anxiety: Continued home meds BuSpar  15 mg p.o. twice daily, Lexapro  20 mg p.o. daily, Seroquel  50 mg nightly and trazodone  125 mg nightly       Consultants: None Procedures performed: None  Disposition: Home Diet recommendation:  Discharge Diet Orders (From admission, onward)     Start     Ordered   10/16/23 0000  Diet - low sodium heart healthy        10/16/23 0951  Cardiac diet DISCHARGE MEDICATION: Allergies as of 10/16/2023       Reactions   Cholestyramine Other (See Comments)   severe        Medication List     STOP taking these medications    cefdinir  300 MG capsule Commonly known as: OMNICEF    HYDROcodone -acetaminophen  5-325 MG tablet Commonly known as: NORCO/VICODIN   metoprolol  succinate 25 MG 24 hr tablet Commonly known as: TOPROL -XL    nitrofurantoin  100 MG capsule Commonly known as: MACRODANTIN    ondansetron  4 MG tablet Commonly known as: ZOFRAN    Vitamin D  (Ergocalciferol ) 1.25 MG (50000 UNIT) Caps capsule Commonly known as: DRISDOL        TAKE these medications    acetaminophen  500 MG tablet Commonly known as: TYLENOL  Take 2 tablets (1,000 mg total) by mouth every 8 (eight) hours.   albuterol  108 (90 Base) MCG/ACT inhaler Commonly known as: VENTOLIN  HFA Inhale into the lungs. Inhale 2 puffs into the lungs every 4 (four) hours as needed for wheezing or shortness of breath.   alendronate  70 MG tablet Commonly known as: FOSAMAX  Take 70 mg by mouth every Monday. Take with a full glass of water on an empty stomach.   atorvastatin  40 MG tablet Commonly known as: LIPITOR Take 40 mg by mouth at bedtime.   benzonatate 200 MG capsule Commonly known as: TESSALON Take 200 mg by mouth 3 (three) times daily as needed for cough.   buprenorphine  7.5 MCG/HR Commonly known as: BUTRANS  Place 1 patch onto the skin once a week. (Apply one patch to upper back/shoulder, outer arm, or upper chest alternating sites weekly on Wednesday at [facility]. May reuse site every 21 days.)   busPIRone  15 MG tablet Commonly known as: BUSPAR  Take 15 mg by mouth 2 (two) times daily.   celecoxib  100 MG capsule Commonly known as: CELEBREX  Take 100 mg by mouth 2 (two) times daily.   cholecalciferol  25 MCG (1000 UNIT) tablet Commonly known as: VITAMIN D3 Take 2,000 Units by mouth daily.   cyanocobalamin  1000 MCG tablet Commonly known as: VITAMIN B12 Take 1,000 mcg by mouth daily.   Dextromethorphan -guaiFENesin  10-100 MG/5ML liquid Take 10 mLs by mouth 3 (three) times daily as needed (for cough and nasal congestion).   diclofenac  Sodium 1 % Gel Commonly known as: VOLTAREN  Apply 4 g topically 4 (four) times daily. For pain in the right knee. Rub into knee for 1 minute for each application.   docusate sodium  100 MG capsule Commonly  known as: COLACE Take 1 capsule (100 mg total) by mouth 2 (two) times daily.   escitalopram  20 MG tablet Commonly known as: LEXAPRO  Take 20 mg by mouth daily.   famotidine  20 MG tablet Commonly known as: PEPCID  Take 20 mg by mouth 2 (two) times daily.   ferrous sulfate  325 (65 FE) MG tablet Take 325 mg by mouth every Monday, Wednesday, and Friday.   fluticasone  50 MCG/ACT nasal spray Commonly known as: FLONASE  Place 1 spray into both nostrils daily.   folic acid  1 MG tablet Commonly known as: FOLVITE  Take 1 mg by mouth daily.   gabapentin  300 MG capsule Commonly known as: NEURONTIN  Take 300 mg by mouth 3 (three) times daily. Take along with one 600 mg capsule for total 900 mg three times daily   gabapentin  600 MG tablet Commonly known as: NEURONTIN  Take 600 mg by mouth 3 (three) times daily. Take along with one 300 mg capsule for total 900 mg three times daily   [place]  75 MG Tabs Generic drug: Vibegron Take by mouth daily.   lactulose 10 GM/15ML solution Commonly known as: CHRONULAC Take 30 mLs (20 g total) by mouth 2 (two) times daily as needed for mild constipation (until constipation has resolved). What changed: when to take this   levothyroxine  100 MCG tablet Commonly known as: SYNTHROID  Take 100 mcg by mouth daily before breakfast.   lidocaine  5 % Commonly known as: LIDODERM  Place 1 patch onto the skin daily. Remove & Discard patch within 12 hours or as directed by MD   meclizine 12.5 MG tablet Commonly known as: ANTIVERT Take 12.5 mg by mouth every 8 (eight) hours as needed.   Mintox Maximum Strength 400-400-40 MG/5ML suspension Generic drug: alum & mag hydroxide-simeth Take 10-20 mLs by mouth 3 (three) times daily as needed (reflux or gas).   nystatin cream Commonly known as: MYCOSTATIN Apply 1 Application topically 2 (two) times daily.   nystatin powder Commonly known as: MYCOSTATIN/NYSTOP 1 Application 2 (two) times daily. Apply moderate amount  to groin and under breast twice daily, please keep area dry.   Olopatadine HCl 0.2 % Soln Apply to eye.   omeprazole 20 MG capsule Commonly known as: PRILOSEC Take 20 mg by mouth every evening.   OPTIVE 0.5-0.9 % ophthalmic solution Generic drug: carboxymethylcellul-glycerin  Place 2 drops into both eyes 4 (four) times daily as needed for dry eyes (for irritated eyes).   pantoprazole  20 MG tablet Commonly known as: PROTONIX  Take 20 mg by mouth 2 (two) times daily.   phenazopyridine 100 MG tablet Commonly known as: PYRIDIUM Take 100 mg by mouth 3 (three) times daily as needed for pain (burning pain with urination).   PSYLLIUM PO Take 17 g by mouth daily.   QUEtiapine  50 MG tablet Commonly known as: SEROQUEL  Take 50 mg by mouth at bedtime.   Symbicort 80-4.5 MCG/ACT inhaler Generic drug: budesonide -formoterol  Inhale 2 puffs into the lungs in the morning and at bedtime.   traZODone  100 MG tablet Commonly known as: DESYREL  Take 100 mg by mouth at bedtime.   traZODone  50 MG tablet Commonly known as: DESYREL  Take 25 mg by mouth at bedtime.   VICKS VAPORUB Oint SMARTSIG:sparingly Topical Every Night        Follow-up Information     Izella Marshal, Jill E, DO Follow up in 1 week(s).   Specialty: Osteopathic Medicine Contact information: [address] [place] [address] [phone]                Discharge Exam: Shelly Freeman Weights   10/12/23 1730  Weight: 98 kg   General exam: Appears calm and comfortable, morbid obese  Respiratory system: Clear to auscultation. Respiratory effort normal. Cardiovascular system: S1 & S2 heard, RRR. No JVD, murmurs, rubs, gallops or clicks. No pedal edema. Gastrointestinal system: Abdomen is nondistended, soft and nontender. No organomegaly or masses felt. Normal bowel sounds heard. Central nervous system: Alert and oriented. No focal neurological deficits. Extremities: Symmetric 5 x 5 power. Skin: No rashes, lesions or  ulcers Psychiatry: Judgement and insight appear normal. Mood & affect appropriate.    Condition at discharge: good  The results of significant diagnostics from this hospitalization (including imaging, microbiology, ancillary and laboratory) are listed below for reference.   Imaging Studies: DG Abd 1 View Result Date: 10/15/2023 CLINICAL DATA:  Ileus. EXAM: ABDOMEN - 1 VIEW COMPARISON:  October 14, 2023. FINDINGS: The bowel gas pattern is normal. No radio-opaque calculi or other significant radiographic abnormality are seen. IMPRESSION: No  significant abnormal bowel dilatation. Electronically Signed   By: Rosalene Colon M.D.   On: 10/15/2023 10:26   DG Abd 1 View Result Date: 10/14/2023 CLINICAL DATA:  Constipation.  Abdominal pain. EXAM: ABDOMEN - 1 VIEW COMPARISON:  CT 2 days ago FINDINGS: Diminished colonic stool burden from recent CT. Small volume of stool persists in the descending colon. There is increased air within the ascending, transverse, and rectosigmoid colon. Few dilated loops of small bowel centrally measure up to 3.4 cm. No evidence of free air in the supine views. Postsurgical change in the upper abdomen. IMPRESSION: 1. Diminished colonic stool burden from recent CT. Small volume of stool persists in the descending colon. 2. Increased air within the ascending, transverse, and rectosigmoid colon. Few dilated loops of small bowel centrally. Findings may represent ileus. Electronically Signed   By: Chadwick Colonel M.D.   On: 10/14/2023 17:20   CT ABDOMEN PELVIS W CONTRAST Result Date: 10/12/2023 CLINICAL DATA:  Left lower quadrant pain EXAM: CT ABDOMEN AND PELVIS WITH CONTRAST TECHNIQUE: Multidetector CT imaging of the abdomen and pelvis was performed using the standard protocol following bolus administration of intravenous contrast. RADIATION DOSE REDUCTION: This exam was performed according to the departmental dose-optimization program which includes automated exposure control, adjustment  of the mA and/or kV according to patient size and/or use of iterative reconstruction technique. CONTRAST:  OMNIPAQUE  IOHEXOL  300 MG/ML  SOLN COMPARISON:  02/09/2023 FINDINGS: Lower chest: No acute abnormality.  Small hiatal hernia. Hepatobiliary: No focal hepatic abnormality. Gallbladder unremarkable. Pancreas: No focal abnormality or ductal dilatation. Spleen: No focal abnormality.  Normal size. Adrenals/Urinary Tract: No adrenal abnormality. No focal renal abnormality. No stones or hydronephrosis. Urinary bladder is unremarkable. Stomach/Bowel: Large stool burden throughout the colon. Postoperative changes in the stomach. No bowel obstruction or inflammatory process. Vascular/Lymphatic: No evidence of aneurysm or adenopathy. Reproductive: Uterus and adnexa unremarkable.  No mass. Other: No free fluid or free air. Musculoskeletal: No acute bony abnormality. IMPRESSION: Large stool burden throughout the colon. Small hiatal hernia. No acute findings. Electronically Signed   By: Janeece Mechanic M.D.   On: 10/12/2023 19:04    Microbiology: Results for orders placed or performed during the hospital encounter of 10/12/23  Urine Culture     Status: None   Collection Time: 10/12/23  5:33 PM   Specimen: Urine, Clean Catch  Result Value Ref Range Status   Specimen Description   Final    URINE, CLEAN CATCH Performed at [facility], [address] [place], [place] [address]    Special Requests   Final    NONE Performed at [facility], [address] [place], [place] [address]    Culture   Final    NO GROWTH Performed at [facility], [address] [place], [place] [address]    Report Status 10/14/2023 FINAL  Final    Labs: CBC: Recent Labs  Lab 10/12/23 1733 10/14/23 1012 10/15/23 0444  WBC 9.1 8.7 7.1  HGB 13.3 13.4 13.2  HCT 43.4 42.1 40.8  MCV 98.4 96.1 96.2  PLT 211 178 179   Basic Metabolic Panel: Recent Labs  Lab 10/12/23 1733 10/14/23 1012  10/15/23 0444  NA 138 135 139  K 5.0 4.4 3.7  CL 101 101 105  CO2 27 26 24   GLUCOSE 82 107* 98  BUN 27* 19 21  CREATININE 1.06* 1.06* 1.04*  CALCIUM  10.1 9.4 9.2  MG  --  2.2 2.3  PHOS  --  4.2 5.0*   Liver Function Tests: Recent Labs  Lab 10/12/23 1733  AST 33  ALT 31  ALKPHOS 98  BILITOT 1.0  PROT 7.2  ALBUMIN 4.0   CBG: No results for input(s): GLUCAP in the last 168 hours.  Discharge time spent: greater than 30 minutes.  Signed: Donaciano Frizzle, MD [facility] 10/16/2023

## 2023-10-16 NOTE — Care Management Obs Status (Signed)
 MEDICARE OBSERVATION STATUS NOTIFICATION   Patient Details  Name: Shelly Freeman MRN: [id number] Date of Birth: 05/01/1951   Medicare Observation Status Notification Given:  Yes    Anise Kerns 10/16/2023, 11:16 AM

## 2023-11-05 ENCOUNTER — Other Ambulatory Visit: Payer: Self-pay | Admitting: Family Medicine

## 2023-11-05 DIAGNOSIS — M5416 Radiculopathy, lumbar region: Secondary | ICD-10-CM

## 2023-11-21 ENCOUNTER — Ambulatory Visit
Admission: RE | Admit: 2023-11-21 | Discharge: 2023-11-21 | Disposition: A | Discharge status: Home / Self Care | Payer: Medicare (Managed Care) | Source: Ambulatory Visit | Attending: Family Medicine | Admitting: Family Medicine

## 2023-11-21 DIAGNOSIS — M5416 Radiculopathy, lumbar region: Secondary | ICD-10-CM

## 2023-12-05 ENCOUNTER — Encounter: Payer: Self-pay | Admitting: Student in an Organized Health Care Education/Training Program

## 2023-12-05 ENCOUNTER — Ambulatory Visit
Attending: Student in an Organized Health Care Education/Training Program | Admitting: Student in an Organized Health Care Education/Training Program

## 2023-12-05 VITALS — BP 128/64 | HR 61 | Temp 98.0°F | Resp 16 | Ht 61.0 in | Wt 216.0 lb

## 2023-12-05 DIAGNOSIS — M5412 Radiculopathy, cervical region: Secondary | ICD-10-CM | POA: Diagnosis present

## 2023-12-05 DIAGNOSIS — M5416 Radiculopathy, lumbar region: Secondary | ICD-10-CM | POA: Diagnosis present

## 2023-12-05 DIAGNOSIS — M47812 Spondylosis without myelopathy or radiculopathy, cervical region: Secondary | ICD-10-CM | POA: Insufficient documentation

## 2023-12-05 DIAGNOSIS — M4722 Other spondylosis with radiculopathy, cervical region: Secondary | ICD-10-CM | POA: Diagnosis not present

## 2023-12-05 DIAGNOSIS — G894 Chronic pain syndrome: Secondary | ICD-10-CM | POA: Diagnosis present

## 2023-12-05 DIAGNOSIS — G8929 Other chronic pain: Secondary | ICD-10-CM | POA: Diagnosis present

## 2023-12-05 MED ORDER — METHOCARBAMOL 1000 MG/10ML IJ SOLN
200.0000 mg | Freq: Once | INTRAMUSCULAR | Status: AC
  Administered 2023-12-05: 200 mg via INTRAMUSCULAR

## 2023-12-05 MED ORDER — KETOROLAC TROMETHAMINE 30 MG/ML IJ SOLN
30.0000 mg | Freq: Once | INTRAMUSCULAR | Status: AC
  Administered 2023-12-05: 30 mg via INTRAMUSCULAR

## 2023-12-05 MED ORDER — KETOROLAC TROMETHAMINE 30 MG/ML IJ SOLN
INTRAMUSCULAR | Status: AC
  Filled 2023-12-05: qty 1

## 2023-12-05 MED ORDER — METHOCARBAMOL 1000 MG/10ML IJ SOLN
INTRAMUSCULAR | Status: AC
Start: 2023-12-05 — End: 2023-12-05
  Filled 2023-12-05: qty 10

## 2023-12-05 MED ORDER — BUPRENORPHINE 10 MCG/HR TD PTWK: 1.0000 | MEDICATED_PATCH | TRANSDERMAL | 0 refills | Status: AC

## 2023-12-05 NOTE — Progress Notes (Signed)
 Safety precautions to be maintained throughout the outpatient stay will include: orient to surroundings, keep bed in low position, maintain call bell within reach at all times, provide assistance with transfer out of bed and ambulation.

## 2023-12-05 NOTE — Progress Notes (Signed)
 PROVIDER NOTE: Interpretation of information contained herein should be left to medically-trained personnel. Specific patient instructions are provided elsewhere under Patient Instructions section of medical record. This document was created in part using AI and STT-dictation technology, any transcriptional errors that may result from this process are unintentional.  Patient: Shelly Freeman  Service: E/M Encounter  Provider: Wallie Sherry, MD  DOB: 1950/08/16  Delivery: Face-to-face  Specialty: Interventional Pain Management  MRN: [id number]  Setting: Ambulatory outpatient facility  Specialty designation: 09  Type: New Patient  Location: Outpatient office facility  PCP: Fleeta Pedro, Kate BRAVO, DO  DOS: 12/05/2023    Referring Prov.: Mouw, Ronal RAMAN, MD   Primary Reason(s) for Visit: Encounter for initial evaluation of one or more chronic problems (new to examiner) potentially causing chronic pain, and posing a threat to normal musculoskeletal function. (Level of risk: High) CC: Hip Pain (left) and Neck Pain (Radiates down shoulders bilateral to hands, hard to move hold things. Decreased ADL's)  HPI  Shelly Freeman is a 73 y.o. year old, female patient, who comes for the first time to our practice referred by Eleanore Ronal RAMAN, MD for our initial evaluation of her chronic pain. She has Intentional opiate overdose (HCC); Acute respiratory failure with hypoxia (HCC); Encephalopathy, metabolic; Hypokalemia; Pain in limb; Hypothyroidism; Essential hypertension; Severe recurrent major depression with psychotic features (HCC); Lupus; GERD (gastroesophageal reflux disease); COPD with acute bronchitis (HCC); Major neurocognitive disorder, due to vascular disease, with behavioral disturbance, mild (HCC); Age related osteoporosis; Right arm pain; DDD (degenerative disc disease), cervical; Encounter for long-term (current) use of high-risk medication; Left anterior knee pain; Lipoma of skin; Primary osteoarthritis of both knees;  Right upper quadrant pain; Urination pain; Vitamin D  deficiency; Acute bronchiolitis; Obesity, Class III, BMI 40-49.9 (morbid obesity); UTI (urinary tract infection); Right rib fracture; SIRS (systemic inflammatory response syndrome) (HCC); Bacteremia due to Gram-negative bacteria; Bilateral pneumonia; AKI (acute kidney injury) (HCC); COPD (chronic obstructive pulmonary disease) (HCC); Fall at home, initial encounter; CAP (community acquired pneumonia); S/P TKR (total knee replacement) using cement, left; Sepsis due to gram-negative UTI (HCC); HTN (hypertension); Depression with anxiety; Severe sepsis with septic shock (HCC); Acute metabolic encephalopathy; COVID-19 virus infection; Cervical radicular pain; Chest pain at rest; Diarrhea; Dizziness; Iron deficiency anemia due to chronic blood loss; Osteoarthritis of left knee; Other fatigue; Pain in rib; Primary osteoarthritis involving multiple joints; Right carpal tunnel syndrome; Shortness of breath; Heart valve problem; Sepsis (HCC); Vomiting and diarrhea; Pyelonephritis; Hematemesis; Infection due to ESBL-producing Escherichia coli; Cystitis; History of ESBL E. coli infection; Lumbar radiculopathy; History of nephrolithiasis; Sleep apnea; Hypotension due to medication; Intractable pain; Chronic pain syndrome; S/P TKR (total knee replacement) using cement, right; Urinary tract infection; and Cervical facet joint syndrome on their problem list. Today she comes in for evaluation of her Hip Pain (left) and Neck Pain (Radiates down shoulders bilateral to hands, hard to move hold things. Decreased ADL's)  Pain Assessment: Location: Left Hip Radiating: down side of left leg to ankles Onset: More than a month ago Duration:   Quality: Aching, Constant, Discomfort, Grimacing, Guarding, Jabbing, Moaning, Nagging, Sore, Shooting Severity: 9 /10 (subjective, self-reported pain score)  Effect on ADL: sleep disturbance, prolonged walking and standing, Adl's Timing:    Modifying factors: rest BP: 128/64  HR: 61  Onset and Duration: Gradual Cause of pain: Unknown Severity: Getting worse, No change since onset, NAS-11 at its worse: 10/10, NAS-11 at its best: 10/10, NAS-11 now: 9/10, and NAS-11 on the average: 10/10 Timing: Not  influenced by the time of the day Aggravating Factors: Climbing, Lifiting, Motion, Prolonged sitting, Prolonged standing, Squatting, Stooping , and Walking Alleviating Factors: Stretching and Resting Associated Problems: Inability to control bowel Quality of Pain: Aching, Burning, Constant, Deep, Disabling, Exhausting, Fearful, Sharp, Stabbing, and Tender Previous Examinations or Tests: MRI scan and X-rays Previous Treatments: The patient denies treatments but some noted in Epic  Shelly Freeman is being evaluated for possible interventional pain management therapies for the treatment of her chronic pain.   Discussed the use of AI scribe software for clinical note transcription with the patient, who gave verbal consent to proceed.  History of Present Illness   Shelly Freeman is a 73 year old female with osteoarthritis and lupus who presents with chronic pain management.  She experiences chronic pain primarily in her left leg, radiating from her lower back down to her ankle, which is more bothersome than her low back pain. She has been using a lidocaine  patch without effective relief for this type of pain.  She also has pain in her neck, shoulders, and both arms, and reports having a pinched nerve in her neck. X-rays revealed stenosis at C6, C7. She reports pain that radiates into her shoulders and down into her fingers.  She has a history of knee pain and underwent knee replacement surgery, after which her knee pain resolved. She received an epidural injection in October 2024, which was not effective in alleviating her pain.  She is currently on a Butrans  patch at a dose of 7.5 mcg/hour, which she has been using for about eight  months, with the dose increased from a lower dose approximately three months ago. She also takes Tylenol  and gabapentin  for pain management.  She reports issues with incontinence, for which she saw a neurologist and a bladder specialist. She was prescribed a medication that has helped control the incontinence.  She denies current shoulder pain and reports that her knees do not hurt after the surgery. No breathing issues.      Historic Controlled Substance Pharmacotherapy Review PMP and historical list of controlled substances: Butrans  patch 7.5 mcg/hr Historical Monitoring: The patient  reports no history of drug use. List of prior UDS Testing: Lab Results  Component Value Date   MDMA NONE DETECTED 08/29/2020   MDMA NONE DETECTED 03/30/2016   MDMA NONE DETECTED 01/28/2016   COCAINSCRNUR NONE DETECTED 08/29/2020   COCAINSCRNUR NONE DETECTED 03/30/2016   COCAINSCRNUR NONE DETECTED 01/28/2016   PCPSCRNUR NONE DETECTED 08/29/2020   PCPSCRNUR NONE DETECTED 03/30/2016   PCPSCRNUR NONE DETECTED 01/28/2016   THCU NONE DETECTED 08/29/2020   THCU NONE DETECTED 03/30/2016   THCU NONE DETECTED 01/28/2016   ETH <5 03/30/2016   ETH <5 01/27/2016   Historical Background Evaluation: [place] PMP: PDMP not reviewed this encounter. Review of the past 57-months conducted.              Department of public safety, offender search: Engineer, mining Information) Non-contributory Risk Assessment Profile: Aberrant behavior: None observed or detected today Risk factors for fatal opioid overdose: None identified today Fatal overdose hazard ratio (HR): Calculation deferred Non-fatal overdose hazard ratio (HR): Calculation deferred Risk of opioid abuse or dependence: 0.7-3.0% with doses <= 36 MME/day and 6.1-26% with doses >= 120 MME/day. Substance use disorder (SUD) risk level: See below Personal History of Substance Abuse (SUD-Substance use disorder):  Alcohol : Negative  Illegal Drugs: Negative  Rx Drugs:  Negative  ORT Risk Level calculation: Low Risk  Opioid Risk Tool - 12/05/23 1201  Personal History of Substance Abuse   Alcohol  Negative    Illegal Drugs Negative    Rx Drugs Negative      Psychological Disease   Psychological Disease Positive    ADD Negative    OCD Negative    Bipolar Positive    Schizophrenia Negative    Depression Positive      Total Score   Opioid Risk Tool Scoring 3    Opioid Risk Interpretation Low Risk         ORT Scoring interpretation table:  Score <3 = Low Risk for SUD  Score between 4-7 = Moderate Risk for SUD  Score >8 = High Risk for Opioid Abuse   PHQ-2 Depression Scale:  Total score: 0  PHQ-2 Scoring interpretation table: (Score and probability of major depressive disorder)  Score 0 = No depression  Score 1 = 15.4% Probability  Score 2 = 21.1% Probability  Score 3 = 38.4% Probability  Score 4 = 45.5% Probability  Score 5 = 56.4% Probability  Score 6 = 78.6% Probability   PHQ-9 Depression Scale:  Total score: 7  PHQ-9 Scoring interpretation table:  Score 0-4 = No depression  Score 5-9 = Mild depression  Score 10-14 = Moderate depression  Score 15-19 = Moderately severe depression  Score 20-27 = Severe depression (2.4 times higher risk of SUD and 2.89 times higher risk of overuse)   Pharmacologic Plan: Butrans  patch              Meds   Current Outpatient Medications:    acetaminophen  (TYLENOL ) 500 MG tablet, Take 2 tablets (1,000 mg total) by mouth every 8 (eight) hours., Disp: , Rfl:    albuterol  (VENTOLIN  HFA) 108 (90 Base) MCG/ACT inhaler, Inhale into the lungs. Inhale 2 puffs into the lungs every 4 (four) hours as needed for wheezing or shortness of breath., Disp: , Rfl:    alendronate  (FOSAMAX ) 70 MG tablet, Take 70 mg by mouth every Monday. Take with a full glass of water on an empty stomach., Disp: , Rfl:    alum & mag hydroxide-simeth (MINTOX MAXIMUM STRENGTH) 400-400-40 MG/5ML suspension, Take 10-20 mLs by mouth 3  (three) times daily as needed (reflux or gas)., Disp: , Rfl:    atorvastatin  (LIPITOR) 40 MG tablet, Take 40 mg by mouth at bedtime., Disp: , Rfl:    benzonatate (TESSALON) 200 MG capsule, Take 200 mg by mouth 3 (three) times daily as needed for cough., Disp: , Rfl:    buprenorphine  (BUTRANS ) 10 MCG/HR PTWK, Place 1 patch onto the skin once a week for 28 days., Disp: 4 patch, Rfl: 0   busPIRone  (BUSPAR ) 15 MG tablet, Take 15 mg by mouth 2 (two) times daily., Disp: , Rfl:    Camph-Eucalypt-Men-Turp-Pet (VICKS VAPORUB) OINT, SMARTSIG:sparingly Topical Every Night, Disp: , Rfl:    cholecalciferol  (VITAMIN D3) 25 MCG (1000 UNIT) tablet, Take 2,000 Units by mouth daily., Disp: , Rfl:    cyanocobalamin  (VITAMIN B12) 1000 MCG tablet, Take 1,000 mcg by mouth daily., Disp: , Rfl:    Dextromethorphan -guaiFENesin  10-100 MG/5ML liquid, Take 10 mLs by mouth 3 (three) times daily as needed (for cough and nasal congestion)., Disp: , Rfl:    diclofenac  Sodium (VOLTAREN ) 1 % GEL, Apply 4 g topically 4 (four) times daily. For pain in the right knee. Rub into knee for 1 minute for each application., Disp: , Rfl:    docusate sodium  (COLACE) 100 MG capsule, Take 1 capsule (100 mg total) by mouth 2 (  two) times daily., Disp: , Rfl:    escitalopram  (LEXAPRO ) 20 MG tablet, Take 20 mg by mouth daily., Disp: , Rfl:    famotidine  (PEPCID ) 20 MG tablet, Take 20 mg by mouth 2 (two) times daily., Disp: , Rfl:    ferrous sulfate  325 (65 FE) MG tablet, Take 325 mg by mouth every Monday, Wednesday, and Friday., Disp: , Rfl:    fluticasone  (FLONASE ) 50 MCG/ACT nasal spray, Place 1 spray into both nostrils daily., Disp: , Rfl:    folic acid  (FOLVITE ) 1 MG tablet, Take 1 mg by mouth daily., Disp: , Rfl:    gabapentin  (NEURONTIN ) 300 MG capsule, Take 300 mg by mouth 3 (three) times daily. Take along with one 600 mg capsule for total 900 mg three times daily, Disp: , Rfl:    gabapentin  (NEURONTIN ) 600 MG tablet, Take 600 mg by mouth 3  (three) times daily. Take along with one 300 mg capsule for total 900 mg three times daily, Disp: , Rfl:    lactulose  (CHRONULAC ) 10 GM/15ML solution, Take 30 mLs (20 g total) by mouth 2 (two) times daily as needed for mild constipation (until constipation has resolved)., Disp: 236 mL, Rfl: 0   levothyroxine  (SYNTHROID ) 100 MCG tablet, Take 100 mcg by mouth daily before breakfast., Disp: , Rfl:    lidocaine  (LIDODERM ) 5 %, Place 1 patch onto the skin daily. Remove & Discard patch within 12 hours or as directed by MD, Disp: , Rfl:    meclizine  (ANTIVERT ) 12.5 MG tablet, Take 12.5 mg by mouth every 8 (eight) hours as needed., Disp: , Rfl:    nystatin (MYCOSTATIN/NYSTOP) powder, 1 Application 2 (two) times daily. Apply moderate amount to groin and under breast twice daily, please keep area dry., Disp: , Rfl:    nystatin cream (MYCOSTATIN), Apply 1 Application topically 2 (two) times daily., Disp: , Rfl:    Olopatadine HCl 0.2 % SOLN, Apply to eye., Disp: , Rfl:    omeprazole (PRILOSEC) 20 MG capsule, Take 20 mg by mouth every evening., Disp: , Rfl:    OPTIVE 0.5-0.9 % ophthalmic solution, Place 2 drops into both eyes 4 (four) times daily as needed for dry eyes (for irritated eyes)., Disp: , Rfl:    pantoprazole  (PROTONIX ) 20 MG tablet, Take 20 mg by mouth 2 (two) times daily., Disp: , Rfl:    phenazopyridine  (PYRIDIUM ) 100 MG tablet, Take 100 mg by mouth 3 (three) times daily as needed for pain (burning pain with urination)., Disp: , Rfl:    PSYLLIUM PO, Take 17 g by mouth daily., Disp: , Rfl:    QUEtiapine  (SEROQUEL ) 50 MG tablet, Take 50 mg by mouth at bedtime., Disp: , Rfl:    SYMBICORT 80-4.5 MCG/ACT inhaler, Inhale 2 puffs into the lungs in the morning and at bedtime., Disp: , Rfl:    traZODone  (DESYREL ) 100 MG tablet, Take 100 mg by mouth at bedtime., Disp: , Rfl:    traZODone  (DESYREL ) 50 MG tablet, Take 25 mg by mouth at bedtime., Disp: , Rfl:    Vibegron (GEMTESA) 75 MG TABS, Take by mouth  daily., Disp: , Rfl:   Imaging Review    Narrative CLINICAL DATA:  Neck trauma.  EXAM: CT CERVICAL SPINE WITHOUT CONTRAST  TECHNIQUE: Multidetector CT imaging of the cervical spine was performed without intravenous contrast. Multiplanar CT image reconstructions were also generated.  RADIATION DOSE REDUCTION: This exam was performed according to the departmental dose-optimization program which includes automated exposure control, adjustment of the mA  and/or kV according to patient size and/or use of iterative reconstruction technique.  COMPARISON:  CT of the cervical spine 05/17/2021  FINDINGS: Alignment: Vertebral body heights alignment are normal. Straightening of the normal cervical lordosis is stable.  Skull base and vertebrae: The craniocervical junction is normal. Vertebral body heights are normal. No acute fractures are present.  Soft tissues and spinal canal: No prevertebral fluid or swelling. No visible canal hematoma.  Disc levels: Progressive endplate changes are present at C6-7, right greater than left. Moderate right and mild left foraminal stenosis is present at C6-7.  Upper chest: The lung apices are clear. The thoracic inlet is within normal limits.  IMPRESSION: 1. No acute fracture or traumatic subluxation. 2. Progressive endplate changes at C6-7, right greater than left. 3. Moderate right and mild left foraminal stenosis at C6-7.   Electronically Signed By: Lonni Necessary M.D. On: 12/13/2022 08:24   MR LUMBAR SPINE WO CONTRAST  Narrative CLINICAL DATA:  Low back pain  EXAM: MRI LUMBAR SPINE WITHOUT CONTRAST  TECHNIQUE: Multiplanar, multisequence MR imaging of the lumbar spine was performed. No intravenous contrast was administered.  COMPARISON:  MRI of the lumbar spine dated 02/09/2023  FINDINGS: Segmentation: Standard.  Alignment:  Physiologic lumbar alignment is maintained.  Vertebrae: Degenerative endplate marrow changes  at a few levels. No compression fractures.  Conus medullaris and cauda equina: The conus medullaris terminates at the level of L1-L2. The distal spinal cord signal intensity is normal.  Paraspinal and other soft tissues: The visualized abdomen and pelvis show no soft tissue abnormality. The visualized aorta is normal.  Disc levels:  L1-L2: Mild disc bulge. No facet arthropathy. No neuroforaminal stenosis. No spinal canal stenosis.  L2-L3: Disc bulge. Mild bilateral facet arthropathy. No neuroforaminal stenosis. No spinal canal stenosis.  L3-L4: Disc bulge. Mild facet arthropathy. Mild left neuroforaminal stenosis. No spinal canal stenosis.  L4-L5: Disc bulge. Mild bilateral facet arthropathy. Mild bilateral neuroforaminal stenosis. Mild spinal canal stenosis.  L5-S1: Disc bulge. Mild bilateral facet arthropathy. Severe right and mild left neuroforaminal stenosis. No spinal canal stenosis.  IMPRESSION: 1. Severe right foraminal stenosis at L5-S1 secondary to disc bulging and facet arthropathy. 2. Mild canal stenosis at L4-L5 secondary to disc bulging and facet arthropathy.   Electronically Signed By: Clem Savory M.D. On: 11/22/2023 17:08  DG Lumbar Spine 2-3 Views  Narrative CLINICAL DATA:  Back pain, lumbar radiculopathy  EXAM: LUMBAR SPINE - 2-3 VIEW  COMPARISON:  12/21/2020  FINDINGS: Similar degenerative changes and associated dextroscoliosis of the lumbar spine. Multilevel lumbar degenerative disc disease spanning all levels with disc space narrowing, sclerosis and endplate osteophytes. L5-S1 is most affected. Facet arthropathy posteriorly at L4-5 and L5-S1. No visualized pars defects. SI joints are maintained. Included pelvis unremarkable. Nonobstructive bowel gas pattern.  IMPRESSION: Lumbar degenerative changes as above. No acute finding by plain radiography.   Electronically Signed By: CHRISTELLA.  Shick M.D. On: 11/28/2023 10:42   CT KNEE LEFT WO  CONTRAST  Narrative CLINICAL DATA:  Chronic left knee pain.  EXAM: CT OF THE LEFT KNEE WITHOUT CONTRAST  TECHNIQUE: Multidetector CT imaging of the left knee was performed according to the standard protocol. Multiplanar CT image reconstructions were also generated.  COMPARISON:  Left femur x-rays dated April 15, 2017.  FINDINGS: Bones/Joint/Cartilage  The hip demonstrates no fracture or dislocation. There is no lytic or blastic lesion.  The knee demonstrates no fracture or dislocation. There is no lytic or blastic lesion. Tricompartmental joint space narrowing with bulky  marginal osteophytes, moderate in the lateral and patellofemoral compartments. Prominent subchondral sclerosis in the lateral compartment. Small joint effusion and Baker cyst. Osteopenia.  The ankle demonstrates no fracture or dislocation. There is no lytic or blastic lesion.  Ligaments  Suboptimally assessed by CT.  Muscles and Tendons  Grossly intact.  No muscle atrophy.  Soft tissues  No fluid collection or hematoma.  No soft tissue mass.  IMPRESSION: 1. Moderate tricompartmental osteoarthritis.   Electronically Signed By: Elsie ONEIDA Shoulder M.D. On: 09/17/2020 10:52   Narrative CLINICAL DATA:  Intraoperative/postoperative total knee.  EXAM: RIGHT KNEE - 1-2 VIEW  COMPARISON:  Right knee x-ray 12/13/2022  FINDINGS: There is a new right knee total arthroplasty in anatomic alignment. There is anterior soft tissue swelling and air compatible with recent surgery. There is no evidence for fracture. Peripheral vascular calcifications are present.  IMPRESSION: New right knee total arthroplasty in anatomic alignment.   Electronically Signed By: Greig Pique M.D. On: 04/25/2023 19:34  Knee-L DG 1-2 views: Results for orders placed during the hospital encounter of 10/25/20  DG Knee 1-2 Views Left  Narrative CLINICAL DATA:  Postop left knee.  EXAM: LEFT KNEE - 1-2  VIEW  FINDINGS: Total left knee replacement. Hardware intact. Anatomic alignment. No acute abnormality.  IMPRESSION: Total left knee replacement.  Hardware intact.  Anatomic alignment.   Electronically Signed By: Debby  Register On: 10/25/2020 13:24   Narrative CLINICAL DATA:  Worsening right knee pain. Total knee arthroplasty 04/25/2023  EXAM: RIGHT KNEE - COMPLETE 4+ VIEW  COMPARISON:  Radiograph 04/25/2023  FINDINGS: Knee arthroplasty in expected alignment. There is no periprosthetic lucency or fracture. One chronic corticated density adjacent to the medial femoral condyle. Patellar resurfacing. The lateral view is limited due to obliquity. There is a small joint effusion. Postoperative air in the joint has resolved in the interim. Mild generalized soft tissue edema persists. There are vascular calcifications.  IMPRESSION: 1. Knee arthroplasty without hardware complication. 2. Small joint effusion.   Electronically Signed By: Andrea Gasman M.D. On: 05/09/2023 23:25   Narrative CLINICAL DATA:  Left knee pain.  EXAM: LEFT KNEE - COMPLETE 4+ VIEW  COMPARISON:  December 21, 2020  FINDINGS: A left knee replacement is seen. There is no evidence of surrounding lucency to suggest the presence of hardware loosening or infection. No evidence of acute fracture or dislocation. A small joint effusion is noted.  IMPRESSION: 1. Left knee replacement without evidence of hardware loosening or infection. 2. Small joint effusion.   Electronically Signed By: Suzen Dials M.D. On: 01/25/2021 22:55   Complexity Note: Imaging results reviewed.                         ROS  Cardiovascular: Abnormal heart rhythm and High blood pressure Pulmonary or Respiratory: Snoring  Neurological: Incontinence:  Urinary Psychological-Psychiatric: Psychiatric disorder, Depressed, and Difficulty sleeping and or falling asleep Gastrointestinal: No reported gastrointestinal  signs or symptoms such as vomiting or evacuating blood, reflux, heartburn, alternating episodes of diarrhea and constipation, inflamed or scarred liver, or pancreas or irrregular and/or infrequent bowel movements Genitourinary: No reported renal or genitourinary signs or symptoms such as difficulty voiding or producing urine, peeing blood, non-functioning kidney, kidney stones, difficulty emptying the bladder, difficulty controlling the flow of urine, or chronic kidney disease Hematological: No reported hematological signs or symptoms such as prolonged bleeding, low or poor functioning platelets, bruising or bleeding easily, hereditary bleeding problems, low energy levels due  to low hemoglobin or being anemic Endocrine: No reported endocrine signs or symptoms such as high or low blood sugar, rapid heart rate due to high thyroid levels, obesity or weight gain due to slow thyroid or thyroid disease Rheumatologic: Butterfly-like facial rash (Lupus) and Rheumatoid arthritis Musculoskeletal: Negative for myasthenia gravis, muscular dystrophy, multiple sclerosis or malignant hyperthermia Work History: Unemployed  Allergies  Shelly Freeman is allergic to cholestyramine.  Laboratory Chemistry Profile   Renal Lab Results  Component Value Date   BUN 21 10/15/2023   CREATININE 1.04 (H) 10/15/2023   BCR 16 11/09/2022   GFRAA >60 11/15/2019   GFRNONAA 57 (L) 10/15/2023   SPECGRAV 1.020 08/26/2023   PHUR 5.5 08/26/2023   PROTEINUR NEGATIVE 10/12/2023     Electrolytes Lab Results  Component Value Date   NA 139 10/15/2023   K 3.7 10/15/2023   CL 105 10/15/2023   CALCIUM  9.2 10/15/2023   MG 2.3 10/15/2023   PHOS 5.0 (H) 10/15/2023     Hepatic Lab Results  Component Value Date   AST 33 10/12/2023   ALT 31 10/12/2023   ALBUMIN 4.0 10/12/2023   ALKPHOS 98 10/12/2023   LIPASE 30 10/12/2023   AMMONIA 17 04/10/2016     ID Lab Results  Component Value Date   HIV Non Reactive 07/21/2022    SARSCOV2NAA NEGATIVE 12/13/2022   STAPHAUREUS NEGATIVE 04/11/2023   MRSAPCR NEGATIVE 04/11/2023     Bone No results found for: VD25OH, [id number], CI6874NY7, CI7874NY7, 25OHVITD1, 25OHVITD2, 25OHVITD3, TESTOFREE, TESTOSTERONE   Endocrine Lab Results  Component Value Date   GLUCOSE 98 10/15/2023   GLUCOSEU NEGATIVE 10/12/2023   HGBA1C 5.6 04/04/2016   TSH 1.092 01/27/2021   FREET4 0.65 04/02/2016   CRTSLPL >100.0 01/26/2021     Neuropathy Lab Results  Component Value Date   VITAMINB12 489 04/10/2016   HGBA1C 5.6 04/04/2016   HIV Non Reactive 07/21/2022     CNS No results found for: COLORCSF, APPEARCSF, RBCCOUNTCSF, WBCCSF, POLYSCSF, LYMPHSCSF, EOSCSF, PROTEINCSF, GLUCCSF, JCVIRUS, CSFOLI, IGGCSF, LABACHR, ACETBL   Inflammation (CRP: Acute  ESR: Chronic) Lab Results  Component Value Date   CRP 13.8 (H) 01/29/2019   ESRSEDRATE 28 01/29/2019   LATICACIDVEN 1.8 10/12/2023     Rheumatology No results found for: RF, ANA, LABURIC, URICUR, LYMEIGGIGMAB, LYMEABIGMQN, HLAB27   Coagulation Lab Results  Component Value Date   INR 1.0 02/12/2023   LABPROT 13.5 02/12/2023   APTT 30 05/21/2019   PLT 179 10/15/2023     Cardiovascular Lab Results  Component Value Date   BNP 1,248.7 (H) 07/20/2022   CKTOTAL 146 01/25/2021   TROPONINI <0.03 04/15/2017   HGB 13.2 10/15/2023   HCT 40.8 10/15/2023     Screening Lab Results  Component Value Date   SARSCOV2NAA NEGATIVE 12/13/2022   STAPHAUREUS NEGATIVE 04/11/2023   MRSAPCR NEGATIVE 04/11/2023   HIV Non Reactive 07/21/2022     Cancer No results found for: CEA, CA125, LABCA2   Allergens No results found for: ALMOND, APPLE, ASPARAGUS, AVOCADO, BANANA, BARLEY, BASIL, BAYLEAF, GREENBEAN, LIMABEAN, WHITEBEAN, BEEFIGE, REDBEET, BLUEBERRY, BROCCOLI, CABBAGE, MELON, CARROT, CASEIN, CASHEWNUT, CAULIFLOWER, CELERY      Note: Lab results reviewed.  PFSH  Drug: Shelly Freeman  reports no history of drug use. Alcohol :  reports no history of alcohol  use. Tobacco:  reports that she has never smoked. She has never been exposed to tobacco smoke. She has never used smokeless tobacco. Medical:  has a past medical history of Acute respiratory failure with hypoxia (HCC),  Age related osteoporosis, Angina at rest [facility]), Anginal pain (HCC), Arthritis, Asthma, CAD (coronary artery disease), Cerebral microvascular disease, Cervical disc disorder with radiculopathy, Chronic pain syndrome, COPD (chronic obstructive pulmonary disease) (HCC), DDD (degenerative disc disease), lumbar (05/14/2016), Depression, Diastolic dysfunction, Dizziness, Dyspnea, GERD (gastroesophageal reflux disease), Gram-negative bacteremia (E.coli) (01/26/2021), Heart valve problem, Hepatomegaly, Hiatal hernia, History of 2019 novel coronavirus disease (COVID-19), History of bilateral cataract extraction (11/2021), History of infection due to ESBL Escherichia coli, History of kidney stones, History of Roux-en-Y gastric bypass, HLD (hyperlipidemia), Hypertension, Hypothyroidism, IDA (iron deficiency anemia), Insomnia, Intentional opioid overdose (HCC), 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 Family: family history includes Breast cancer in her cousin and maternal aunt.  Past Surgical History:  Procedure Laterality Date   APPENDECTOMY     BREAST BIOPSY Left    neg   CATARACT EXTRACTION W/PHACO Left 11/13/2021   Procedure: CATARACT EXTRACTION PHACO AND INTRAOCULAR LENS PLACEMENT (IOC) LEFT 3.63 00:35.9;  Surgeon: Myrna Adine Anes, MD;  Location: [facility];  Service: Ophthalmology;  Laterality: Left;   CATARACT EXTRACTION W/PHACO Right 12/04/2021    Procedure: CATARACT EXTRACTION PHACO AND INTRAOCULAR LENS PLACEMENT (IOC) RIGHT;  Surgeon: Myrna Adine Anes, MD;  Location: [facility];  Service: Ophthalmology;  Laterality: Right;  3.11 00:32.6   CESAREAN SECTION     x 2   COLONOSCOPY N/A 08/24/2021   Procedure: COLONOSCOPY;  Surgeon: Onita Elspeth Sharper, DO;  Location: [facility] END[facility]COPY;  Service: Gastroenterology;  Laterality: N/A;  SPANISH INTERPRETER   COLONOSCOPY WITH PROPOFOL  N/A 07/01/2017   Procedure: COLONOSCOPY WITH PROPOFOL ;  Surgeon: Viktoria Lamar DASEN, MD;  Location: [facility] ENDOSCOPY;  Service: Endoscopy;  Laterality: N/A;   CORONARY ANGIOPLASTY WITH STENT PLACEMENT     Location: [place], [place]   ESOPHAGOGASTRODUODENOSCOPY (EGD) WITH PROPOFOL  N/A 07/01/2017   Procedure: ESOPHAGOGASTRODUODENOSCOPY (EGD) WITH PROPOFOL ;  Surgeon: Viktoria Lamar DASEN, MD;  Location: [facility] ENDOSCOPY;  Service: Endoscopy;  Laterality: N/A;   GASTRIC BYPASS     IR INJECT/THERA/INC NEEDLE/CATH/PLC EPI/LUMB/SAC W/IMG  02/12/2023   LIPOMA EXCISION Left 06/14/2022   Procedure: EXCISION LIPOMA, left chest wall;  Surgeon: Jordis Laneta FALCON, MD;  Location: [facility] ORS;  Service: General;  Laterality: Left;   LITHOTRIPSY     NISSEN FUNDOPLICATION N/A    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   Procedure: TOTAL KNEE ARTHROPLASTY;  Surgeon: Kathlynn Sharper, MD;  Location: [facility];  Service: Orthopedics;  Laterality: Left;   TOTAL KNEE ARTHROPLASTY Right 04/25/2023   Procedure: TOTAL KNEE ARTHROPLASTY;  Surgeon: Lorelle Hussar, MD;  Location: [facility];  Service: Orthopedics;  Laterality: Right;   TUBAL LIGATION     Active Ambulatory Problems    Diagnosis Date Noted   Intentional opiate overdose (HCC) 03/30/2016   Acute respiratory failure with hypoxia (HCC) 04/03/2016   Encephalopathy, metabolic 04/03/2016   Hypokalemia 04/03/2016   Pain in limb 04/03/2016   Hypothyroidism 04/03/2016   Essential hypertension 04/03/2016   Severe recurrent major  depression with psychotic features (HCC) 04/04/2016   Lupus 04/04/2016   GERD (gastroesophageal reflux disease) 04/04/2016   COPD with acute bronchitis (HCC) 04/04/2016   Major neurocognitive disorder, due to vascular disease, with behavioral disturbance, mild (HCC) 04/15/2016   Age related osteoporosis 03/19/2016   Right arm pain 03/19/2016   DDD (degenerative disc disease), cervical 05/14/2016   Encounter for long-term (current)  use of high-risk medication 03/19/2016   Left anterior knee pain 10/24/2016   Lipoma of skin 05/14/2016   Primary osteoarthritis of both knees 03/19/2016   Right upper quadrant pain 10/24/2016   Urination pain 06/26/2016   Vitamin D  deficiency 05/14/2016   Acute bronchiolitis 01/28/2019   Obesity, Class III, BMI 40-49.9 (morbid obesity) 05/23/2019   UTI (urinary tract infection) 05/23/2019   Right rib fracture 05/23/2019   SIRS (systemic inflammatory response syndrome) (HCC) 05/23/2019   Bacteremia due to Gram-negative bacteria 05/23/2019   Bilateral pneumonia 08/29/2020   AKI (acute kidney injury) (HCC) 08/29/2020   COPD (chronic obstructive pulmonary disease) (HCC) 08/29/2020   Fall at home, initial encounter 08/29/2020   CAP (community acquired pneumonia) 08/29/2020   S/P TKR (total knee replacement) using cement, 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   Sepsis due to gram-negative UTI (HCC) 01/26/2021   HTN (hypertension) 01/26/2021   Depression with anxiety 01/26/2021   Severe sepsis with septic shock (HCC) 01/26/2021   Acute metabolic encephalopathy 01/26/2021   COVID-19 virus infection 01/26/2021   Cervical radicular pain 07/16/2017   Chest pain at rest 05/19/2019   Diarrhea 09/17/2019   Dizziness 09/17/2019   Iron deficiency anemia due to chronic blood loss 10/29/2018   Osteoarthritis of left knee 10/01/2019   Other fatigue 06/09/2019   Pain in rib 05/19/2019   Primary osteoarthritis involving multiple joints 10/29/2018   Right carpal tunnel syndrome 07/31/2017    Shortness of breath 06/09/2019   Heart valve problem 05/29/2022   Sepsis (HCC) 07/20/2022   Vomiting and diarrhea 07/20/2022   Pyelonephritis 07/20/2022   Hematemesis 07/20/2022   Infection due to ESBL-producing Escherichia coli 07/26/2022   Cystitis 09/13/2022   History of ESBL E. coli infection 09/13/2022   Lumbar radiculopathy 02/10/2023   History of nephrolithiasis 02/10/2023   Sleep apnea    Hypotension due to medication 02/10/2023   Intractable pain 02/10/2023   Chronic pain syndrome 02/13/2023   S/P TKR (total knee replacement) using cement, right 04/25/2023   Urinary tract infection 10/12/2023   Cervical facet joint syndrome 12/05/2023   Resolved Ambulatory Problems    Diagnosis Date Noted   No Resolved Ambulatory Problems   Past Medical History:  Diagnosis Date   Angina at rest [facility])    Anginal pain (HCC)    Arthritis    Asthma    CAD (coronary artery disease)    Cerebral microvascular disease    Cervical disc disorder with radiculopathy    DDD (degenerative disc disease), lumbar 05/14/2016   Depression    Diastolic dysfunction    Dyspnea    Gram-negative bacteremia (E.coli) 01/26/2021   Hepatomegaly    Hiatal hernia    History of 2019 novel coronavirus disease (COVID-19)    History of bilateral cataract extraction 11/2021   History of infection due to ESBL Escherichia coli    History of kidney stones    History of Roux-en-Y gastric bypass    HLD (hyperlipidemia)    Hypertension    IDA (iron deficiency anemia)    Insomnia    Intentional opioid overdose (HCC)    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 In no apparent acute distress Vitals:   12/05/23 1056 12/05/23 1153  BP: 128/64  128/64  Pulse: 61  61  Resp: 16 16  Temp: 98 F (36.7 C) 98 F (36.7 C)  SpO2: 98% 99%  Weight: 216 lb (98 kg) 216 lb (98 kg)  Height: 5' 1 (1.549 m) 5' 1 (1.549 m)   BMI Assessment: Estimated body mass index is 40.81 kg/m as calculated from the following:   Height as of this encounter: 5' 1 (1.549 m).   Weight as of this encounter: 216 lb (98 kg).  BMI interpretation table: BMI level Category Range association with higher incidence of chronic pain  <18 kg/m2 Underweight   18.5-24.9 kg/m2 Ideal body weight   25-29.9 kg/m2 Overweight Increased incidence by 20%  30-34.9 kg/m2 Obese (Class I) Increased incidence by 68%  35-39.9 kg/m2 Severe obesity (Class II) Increased incidence by 136%  >40 kg/m2 Extreme obesity (Class III) Increased incidence by 254%   Patient's current BMI Ideal Body weight  Body mass index is 40.81 kg/m. Ideal body weight: 47.8 kg (105 lb 6.1 oz) Adjusted ideal body weight: 67.9 kg (149 lb 10 oz)   BMI Readings from Last 4 Encounters:  12/05/23 40.81 kg/m  10/12/23 40.81 kg/m  08/26/23 40.62 kg/m  05/09/23 41.76 kg/m   Wt Readings from Last 4 Encounters:  12/05/23 216 lb (98 kg)  10/12/23 216 lb (98 kg)  08/26/23 215 lb (97.5 kg)  05/09/23 221 lb (100.2 kg)    Psych/Mental status: Alert, oriented x 3 (person, place, & time)       Eyes: PERLA Respiratory: No evidence of acute respiratory distress Cervical Spine Area Exam  Skin & Axial Inspection: No masses, redness, edema, swelling, or associated skin lesions Alignment: Symmetrical Functional ROM: Pain restricted ROM      Stability: No instability detected Muscle Tone/Strength: Functionally intact. No obvious neuro-muscular anomalies detected. Sensory (Neurological): Dermatomal pain pattern LEFT Palpation: No palpable anomalies             Upper Extremity (UE) Exam    Side: Right upper extremity  Side: Left upper extremity  Skin & Extremity Inspection: Skin color, temperature, and hair growth are WNL. No  peripheral edema or cyanosis. No masses, redness, swelling, asymmetry, or associated skin lesions. No contractures.  Skin & Extremity Inspection: Skin color, temperature, and hair growth are WNL. No peripheral edema or cyanosis. No masses, redness, swelling, asymmetry, or associated skin lesions. No contractures.  Functional ROM: Unrestricted ROM          Functional ROM: Pain restricted ROM for all joints of upper extremity  Muscle Tone/Strength: Functionally intact. No obvious neuro-muscular anomalies detected.  Muscle Tone/Strength: Functionally intact. No obvious neuro-muscular anomalies detected.  Sensory (Neurological): Unimpaired          Sensory (Neurological): Dermatomal pain pattern          Palpation: No palpable anomalies              Palpation: No palpable anomalies              Provocative Test(s):  Phalen's test: deferred Tinel's test: deferred Apley's scratch test (touch opposite shoulder):  Action 1 (Across chest): deferred Action 2 (Overhead): deferred Action 3 (LB reach): deferred   Provocative Test(s):  Phalen's test: deferred Tinel's test: deferred Apley's scratch test (touch opposite shoulder):  Action 1 (Across chest): Decreased ROM Action 2 (Overhead): Decreased ROM Action 3 (LB reach): Decreased ROM    Lumbar Spine Area Exam  Skin & Axial Inspection: No masses, redness, or swelling Alignment: Symmetrical Functional ROM: Pain  restricted ROM affecting primarily the left Stability: No instability detected Muscle Tone/Strength: Functionally intact. No obvious neuro-muscular anomalies detected. Sensory (Neurological): Dermatomal pain pattern LEFT Palpation: No palpable anomalies       Provocative Tests: Hyperextension/rotation test: (+) due to pain. Lumbar quadrant test (Kemp's test): (+) on the left for foraminal stenosis   Gait & Posture Assessment  Ambulation: Unassisted Gait: Relatively normal for age and body habitus Posture: WNL  Lower Extremity Exam     Side: Right lower extremity  Side: Left lower extremity  Stability: No instability observed          Stability: No instability observed          Skin & Extremity Inspection: Skin color, temperature, and hair growth are WNL. No peripheral edema or cyanosis. No masses, redness, swelling, asymmetry, or associated skin lesions. No contractures.  Skin & Extremity Inspection: Skin color, temperature, and hair growth are WNL. No peripheral edema or cyanosis. No masses, redness, swelling, asymmetry, or associated skin lesions. No contractures.  Functional ROM: Unrestricted ROM                  Functional ROM: Pain restricted ROM for hip and knee joints          Muscle Tone/Strength: Functionally intact. No obvious neuro-muscular anomalies detected.  Muscle Tone/Strength: Functionally intact. No obvious neuro-muscular anomalies detected.  Sensory (Neurological): Unimpaired        Sensory (Neurological): Dermatomal pain pattern        DTR: Patellar: deferred today Achilles: deferred today Plantar: deferred today  DTR: Patellar: deferred today Achilles: deferred today Plantar: deferred today  Palpation: No palpable anomalies  Palpation: No palpable anomalies    Assessment  Primary Diagnosis & Pertinent Problem List: The primary encounter diagnosis was Cervical radicular pain. Diagnoses of Cervical facet joint syndrome, Chronic radicular lumbar pain, Lumbar radiculopathy, and Chronic pain syndrome were also pertinent to this visit.  Visit Diagnosis (New problems to examiner): 1. Cervical radicular pain   2. Cervical facet joint syndrome   3. Chronic radicular lumbar pain   4. Lumbar radiculopathy   5. Chronic pain syndrome    Plan of Care (Initial workup plan)  I had extensive discussion with the patient about the goals of pain management.  We discussed nonpharmacological approaches to pain management that include physical therapy, dieting, sleep hygiene, psychotherapy, interventional therapy.   We discussed the importance of understanding the type of pain including neuropathic, nociceptive, centralized.  I also stressed the importance of multimodal analgesia with an emphasis on nondrug modalities including self management, behavioral health support and physical therapy.  We discussed the importance of physical therapy and how a individualized physical therapy and occupational therapy program tailored to patient limitations can be helpful at improving physical function. We also discussed the importance of insomnia and disrupted sleep and how improved sleep hygiene and cognitive therapy could be helpful.  Psychotherapy including CBT, mind-body therapies, pain coping strategies can be helpful for patients whose pain impacts mood, sleep, quality of life, relationships with others.  We discussed avoiding benzodiazepines.  I also had an extensive discussion with the patient about interventional therapies which is my expertise and how these could be incorporated into an effective multimodal pain management plan.  Assessment and Plan    Left lower extremity radicular pain   Chronic left lower extremity radicular pain likely stems from lumbar spine pathology, with pain radiating from the lower back to the left leg and ankle. A previous right-sided epidural  injection was ineffective as the pain is on the left side. Schedule a left-sided lumbar epidural steroid injection. Administer an anti-inflammatory injection and a muscle relaxer today.  Cervical radiculopathy with neck and left shoulder pain   Cervical radiculopathy with neck and left shoulder pain is likely due to stenosis at C6-C7, with potential radiation into the shoulder and fingers. Previous imaging cervical CT indicates cervical foraminal stenosis.  Schedule a left-sided cervical epidural steroid injection. Increase the Butrans  patch from 7.5 to 10 mcg/hour. Administer an anti-inflammatory injection and a muscle relaxer today.  Chronic pain  syndrome   Chronic pain syndrome involves multiple areas of pain, including the neck, shoulder, and lower extremity, affecting sleep and daily activities. Current management includes Tylenol , gabapentin , and a Butrans  patch. Increase the Butrans  patch from 7.5 to 10 mcg/hour. Administer an anti-inflammatory injection and a muscle relaxer today.         Procedure Orders         Cervical Epidural Injection         Lumbar Transforaminal Epidural     Pharmacotherapy (current): Medications ordered:  Meds ordered this encounter  Medications   buprenorphine  (BUTRANS ) 10 MCG/HR PTWK    Sig: Place 1 patch onto the skin once a week for 28 days.    Dispense:  4 patch    Refill:  0    Chronic Pain: STOP Act (Not applicable) Fill 1 day early if closed on refill date. Avoid benzodiazepines within 8 hours of opioids   methocarbamol  (ROBAXIN ) injection 200 mg   ketorolac  (TORADOL ) 30 MG/ML injection 30 mg   Medications administered during this visit: We administered methocarbamol  and ketorolac .    Interventional management options: Shelly Freeman was informed that there is no guarantee that she would be a candidate for interventional therapies. The decision will be based on the results of diagnostic studies, as well as Shelly Freeman's risk profile.  Procedure(s) under consideration:  Cervical ESI Lumbar ESI    Provider-requested follow-up: Return in about 2 weeks (around 12/19/2023) for Left C-ESI, and Left L4 and L5 TF ESI, in clinic IV Versed .  Future Appointments  Date Time Provider Department Center  03/02/2024 10:30 AM MacDiarmid, Glendia, MD [facility] None   I discussed the assessment and treatment plan with the patient. The patient was provided an opportunity to ask questions and all were answered. The patient agreed with the plan and demonstrated an understanding of the instructions.  Patient advised to call back or seek an in-person evaluation if the symptoms or condition  worsens.  Duration of encounter: .  Total time on encounter, as per AMA guidelines included both the face-to-face and non-face-to-face time personally spent by the physician and/or other qualified health care professional(s) on the day of the encounter (includes time in activities that require the physician or other qualified health care professional and does not include time in activities normally performed by clinical staff). Physician's time may include the following activities when performed: Preparing to see the patient (e.g., pre-charting review of records, searching for previously ordered imaging, lab work, and nerve conduction tests) Review of prior analgesic pharmacotherapies. Reviewing PMP Interpreting ordered tests (e.g., lab work, imaging, nerve conduction tests) Performing post-procedure evaluations, including interpretation of diagnostic procedures Obtaining and/or reviewing separately obtained history Performing a medically appropriate examination and/or evaluation Counseling and educating the patient/family/caregiver Ordering medications, tests, or procedures Referring and communicating with other health care professionals (when not separately reported) Documenting clinical information in the electronic or other health record  Independently interpreting results (not separately reported) and communicating results to the patient/ family/caregiver Care coordination (not separately reported)  Note by: Wallie Sherry, MD (TTS and AI technology used. I apologize for any typographical errors that were not detected and corrected.) Date: 12/05/2023; Time: 12:08 PM

## 2023-12-05 NOTE — Patient Instructions (Signed)
 ______________________________________________________________________    Preparing for your procedure  Appointments: If you think you may not be able to keep your appointment, call 24-48 hours in advance to cancel. We need time to make it available to others.  Procedure visits are for procedures only. During your procedure appointment there will be: NO Prescription Refills*. NO medication changes or discussions*. NO discussion of disability issues*. NO unrelated pain problem evaluations*. NO evaluations to order other pain procedures*. *These will be addressed at a separate and distinct evaluation encounter on the provider's evaluation schedule and not during procedure days.  Instructions: Food intake: Avoid eating anything solid for at least 8 hours prior to your procedure. Clear liquid intake: You may take clear liquids such as water up to 2 hours prior to your procedure. (No carbonated drinks. No soda.) Transportation: Unless otherwise stated by your physician, bring a driver. (Driver cannot be a Market researcher, Pharmacist, community, or any other form of public transportation.) Morning Medicines: Except for blood thinners, take all of your other morning medications with a sip of water. Make sure to take your heart and blood pressure medicines. If your blood pressure's lower number is above 100, the case will be rescheduled. Blood thinners: Make sure to stop your blood thinners as instructed.  If you take a blood thinner, but were not instructed to stop it, call our office [phone] and ask to talk to a nurse. Not stopping a blood thinner prior to certain procedures could lead to serious complications. Diabetics on insulin: Notify the staff so that you can be scheduled 1st case in the morning. If your diabetes requires high dose insulin, take only  of your normal insulin dose the morning of the procedure and notify the staff that you have done so. Preventing infections: Shower with an antibacterial soap the  morning of your procedure.  Build-up your immune system: Take 1000 mg of Vitamin C with every meal (3 times a day) the day prior to your procedure. Antibiotics: Inform the nursing staff if you are taking any antibiotics or if you have any conditions that may require antibiotics prior to procedures. (Example: recent joint implants)   Pregnancy: If you are pregnant make sure to notify the nursing staff. Not doing so may result in injury to the fetus, including death.  Sickness: If you have a cold, fever, or any active infections, call and cancel or reschedule your procedure. Receiving steroids while having an infection may result in complications. Arrival: You must be in the facility at least 30 minutes prior to your scheduled procedure. Tardiness: Your scheduled time is also the cutoff time. If you do not arrive at least 15 minutes prior to your procedure, you will be rescheduled.  Children: Do not bring any children with you. Make arrangements to keep them home. Dress appropriately: There is always a possibility that your clothing may get soiled. Avoid long dresses. Valuables: Do not bring any jewelry or valuables.  Reasons to call and reschedule or cancel your procedure: (Following these recommendations will minimize the risk of a serious complication.) Surgeries: Avoid having procedures within 2 weeks of any surgery. (Avoid for 2 weeks before or after any surgery). Flu Shots: Avoid having procedures within 2 weeks of a flu shots or . (Avoid for 2 weeks before or after immunizations). Barium: Avoid having a procedure within 7-10 days after having had a radiological study involving the use of radiological contrast. (Myelograms, Barium swallow or enema study). Heart attacks: Avoid any elective procedures or surgeries for the  initial 6 months after a Myocardial Infarction (Heart Attack). Blood thinners: It is imperative that you stop these medications before procedures. Let us  know if you if you take  any blood thinner.  Infection: Avoid procedures during or within two weeks of an infection (including chest colds or gastrointestinal problems). Symptoms associated with infections include: Localized redness, fever, chills, night sweats or profuse sweating, burning sensation when voiding, cough, congestion, stuffiness, runny nose, sore throat, diarrhea, nausea, vomiting, cold or Flu symptoms, recent or current infections. It is specially important if the infection is over the area that we intend to treat. Heart and lung problems: Symptoms that may suggest an active cardiopulmonary problem include: cough, chest pain, breathing difficulties or shortness of breath, dizziness, ankle swelling, uncontrolled high or unusually low blood pressure, and/or palpitations. If you are experiencing any of these symptoms, cancel your procedure and contact your primary care physician for an evaluation.  Remember:  Regular Business hours are:  Monday to Thursday 8:00 AM to 4:00 PM  Provider's Schedule: Eric Como, MD:  Procedure days: Tuesday and Thursday 7:30 AM to 4:00 PM  Wallie Sherry, MD:  Procedure days: Monday and Wednesday 7:30 AM to 4:00 PM Last  Updated: 04/16/2023 ______________________________________________________________________    Inyeccin epidural de corticoesteroides Epidural Steroid Injection  Una inyeccin epidural de corticoesteroides es una inyeccin de un medicamento con corticoesteroides, tambin llamado cortisona, y un medicamento anestsico que se aplica en el espacio epidural. Este espacio se encuentra entre la mdula espinal y los huesos de la espalda. Esta inyeccin ayuda a Engineer, materials causado por la irritacin o la inflamacin de una raz nerviosa. El alivio del dolor que recibe de la inyeccin depende de la causa de la afeccin y de cunto tiempo dure Chief Technology Officer. Es posible que tenga un perodo de Sales promotion account executive mayor despus de la inyeccin, antes de que el medicamento  con corticoesteroides Education officer, environmental. Generalmente, este medicamento comienza a Retail banker trmino de 1 a 3 das. En algunos casos, podra necesitar de 7 a 10 das para sentir [employer]. Informe al mdico acerca de lo siguiente: Cualquier alergia que tenga. Todos los [employer] usa , incluidos vitaminas, hierbas, gotas oftlmicas, cremas y [address] de [address]. Cualquier problema que usted o los [employer] de su familia hayan tenido con el uso de anestesia. Cualquier problema de sangrado que tenga. Cirugas a las que se haya sometido. Cualquier afeccin mdica que tenga. Si est embarazada o podra estarlo. Cules son los riesgos? El mdico hablar con usted [employer]. Pueden incluir: Dolor de cabeza. Sangrado. Infeccin. Reaccin alrgica a los medicamentos o a los tintes de [place]. Dao nervioso. Incapacidad de moverse (parlisis). Esto es poco frecuente. Qu ocurre antes del procedimiento? Medicamentos Consulte a su mdico si debe o no hacer lo siguiente: Cambiar o suspender los medicamentos que usa  habitualmente. Estos incluyen cualquier medicamento para la diabetes o anticoagulantes que use. Tomar medicamentos como aspirina e ibuprofeno. Estos medicamentos pueden tener un efecto anticoagulante en la [place]. No los tome, a menos que se lo indique el mdico. Usar medicamentos de venta libre, vitaminas, hierbas y suplementos. Instrucciones generales Siga las instrucciones del mdico respecto de lo que puede comer y beber. Pregntele al mdico qu medidas se tomarn para ayudar a prevenir una infeccin. Si va a marcharse a su casa inmediatamente despus del procedimiento, pdale a un adulto responsable que: Lo lleve a su casa desde el hospital o la clnica. No se le permitir conducir. Lo cuide durante  el tiempo que le indiquen. Si tiene diabetes o prediabetes, hable con el mdico para Psychologist, clinical (glucosa). Los medicamentos con  corticoesteroides TEFL teacher que su nivel de azcar en la sangre aumente y permanezca alto durante algunos das. El mdico puede elaborar un plan para asegurarse de que el nivel de azcar en la sangre permanezca bajo control. Qu ocurre durante el procedimiento? Le colocarn una va intravenosa (i.v.) en una vena. Es posible que le administren un sedante para ayudarlo a Lexicographer. Le pedirn que se siente o que se recueste de lado. Higienizarn el sitio de la inyeccin. Se usar un equipo de rayos  X para guiar la aguja cerca del nervio que causa dolor. Le introducirn una aguja a travs de la piel hasta el espacio epidural. Esto puede causarle algunas molestias. Es posible que se inyecte un tinte de Social research officer, government para asegurarse de que el medicamento con corticoesteroides se enve al lugar exacto al que debe ir. Se inyectar el medicamento con corticoesteroides y un anestsico en el espacio epidural para Engineer, materials. Se retirar la aguja. Le colocarn una venda (vendaje) sobre el sitio de la inyeccin. El procedimiento puede variar segn el mdico y el hospital. Michelina ocurre despus del procedimiento? Le controlarn la presin arterial, la frecuencia cardaca, la frecuencia respiratoria y Air cabin crew de oxgeno en la sangre hasta que le den el alta del hospital o la clnica. Se extraer la va intravenosa. Puede sentir debilidad o adormecimiento en el brazo o la pierna durante algunas horas. Esta informacin no tiene Theme park manager el consejo del mdico. Asegrese de hacerle al mdico cualquier pregunta que tenga. Document Revised: 09/26/2022 Document Reviewed: 09/26/2022 Elsevier Patient Education  2024 [employer].

## 2023-12-23 ENCOUNTER — Encounter: Payer: Self-pay | Admitting: Student in an Organized Health Care Education/Training Program

## 2023-12-23 ENCOUNTER — Ambulatory Visit
Admission: RE | Admit: 2023-12-23 | Discharge: 2023-12-23 | Disposition: A | Discharge status: Home / Self Care | Source: Ambulatory Visit | Attending: Student in an Organized Health Care Education/Training Program | Admitting: Student in an Organized Health Care Education/Training Program

## 2023-12-23 ENCOUNTER — Ambulatory Visit (HOSPITAL_BASED_OUTPATIENT_CLINIC_OR_DEPARTMENT_OTHER): Admitting: Student in an Organized Health Care Education/Training Program

## 2023-12-23 VITALS — BP 134/59 | HR 59 | Temp 97.0°F | Resp 16 | Ht 61.0 in | Wt 215.0 lb

## 2023-12-23 DIAGNOSIS — M5412 Radiculopathy, cervical region: Secondary | ICD-10-CM | POA: Diagnosis present

## 2023-12-23 DIAGNOSIS — G8929 Other chronic pain: Secondary | ICD-10-CM | POA: Diagnosis present

## 2023-12-23 DIAGNOSIS — M5416 Radiculopathy, lumbar region: Secondary | ICD-10-CM

## 2023-12-23 MED ORDER — DEXAMETHASONE SODIUM PHOSPHATE 10 MG/ML IJ SOLN
20.0000 mg | Freq: Once | INTRAMUSCULAR | Status: AC
  Administered 2023-12-23: 20 mg
  Filled 2023-12-23: qty 2

## 2023-12-23 MED ORDER — IOHEXOL 180 MG/ML  SOLN
10.0000 mL | Freq: Once | INTRAMUSCULAR | Status: AC
  Administered 2023-12-23: 10 mL via EPIDURAL
  Filled 2023-12-23: qty 20

## 2023-12-23 MED ORDER — MIDAZOLAM HCL 2 MG/2ML IJ SOLN
0.5000 mg | Freq: Once | INTRAMUSCULAR | Status: AC
  Administered 2023-12-23: 2 mg via INTRAVENOUS
  Filled 2023-12-23: qty 2

## 2023-12-23 MED ORDER — LACTATED RINGERS IV SOLN: Freq: Once | INTRAVENOUS | Status: AC

## 2023-12-23 MED ORDER — SODIUM CHLORIDE 0.9% FLUSH
2.0000 mL | Freq: Once | INTRAVENOUS | Status: AC
  Administered 2023-12-23: 2 mL

## 2023-12-23 MED ORDER — DEXAMETHASONE SODIUM PHOSPHATE 10 MG/ML IJ SOLN
10.0000 mg | Freq: Once | INTRAMUSCULAR | Status: AC
  Administered 2023-12-23: 10 mg
  Filled 2023-12-23: qty 1

## 2023-12-23 MED ORDER — ROPIVACAINE HCL 2 MG/ML IJ SOLN
2.0000 mL | Freq: Once | INTRAMUSCULAR | Status: AC
  Administered 2023-12-23: 2 mL via EPIDURAL
  Filled 2023-12-23: qty 20

## 2023-12-23 MED ORDER — LIDOCAINE HCL 2 % IJ SOLN
20.0000 mL | Freq: Once | INTRAMUSCULAR | Status: AC
  Administered 2023-12-23: 400 mg
  Filled 2023-12-23: qty 20

## 2023-12-23 NOTE — Progress Notes (Signed)
 PROVIDER NOTE: Interpretation of information contained herein should be left to medically-trained personnel. Specific patient instructions are provided elsewhere under Patient Instructions section of medical record. This document was created in part using STT-dictation technology, any transcriptional errors that may result from this process are unintentional.  Patient: Shelly Freeman Type: Established DOB: 07/17/1950 MRN: [id number] PCP: Fleeta Pedro, Kate BRAVO, DO  Service: Procedure DOS: 12/23/2023 Setting: Ambulatory Location: Ambulatory outpatient facility Delivery: Face-to-face Provider: Wallie Sherry, MD Specialty: Interventional Pain Management Specialty designation: 09 Location: Outpatient facility Ref. Prov.: Sherry Wallie, MD       Interventional Therapy   Type: Cervical Epidural Steroid injection (CESI) (Interlaminar) #1  Laterality: Left (-LT)  Level: C7-T1 DOS: 12/23/2023  Provider: Wallie Sherry, MD Imaging: Fluoroscopy-guided Spinal ([id number]) Anesthesia: Local anesthesia (1-2% Lidocaine ) Sedation: Minimal Sedation                        Medical Necessity Purpose: Diagnostic/Therapeutic Rationale (medical necessity): procedure needed and proper for the diagnosis and/or treatment of Ms. Detert's medical symptoms and needs. Indications: Cervicalgia, cervical radicular pain, degenerative disc disease, severe enough to impact quality of life or function.  NAS-11 Pain score:   Pre-procedure: 6 /10   Post-procedure: 8 /10     Position  Prep  Materials:  Location setting: Procedure suite Position: Prone, on modified reverse trendelenburg to facilitate breathing, with head in head-cradle. Pillows positioned under chest (below chin-level) with cervical spine flexed. Safety Precautions: Patient was assessed for positional comfort and pressure points before starting the procedure. Prepping solution: DuraPrep (Iodine Povacrylex [0.7% available iodine] and Isopropyl Alcohol ,  74% w/w) Prep Area: Entire  cervicothoracic region Approach: percutaneous, paramedial Intended target: Posterior cervical epidural space Materials Procedure:  Tray: Epidural Needle(s): Epidural (Tuohy) Qty: 1 Length: (90mm) 3.5-inch Gauge: 22G  H&P (Pre-op Assessment):  Ms. Shelly Freeman is a 73 y.o. (year old), female patient, seen today for interventional treatment. 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 [name] has a current medication list which includes the following prescription(s): acetaminophen , albuterol , alendronate , mintox maximum strength, atorvastatin , benzonatate, buprenorphine , buspirone , vicks vaporub, cholecalciferol , cyanocobalamin , dextromethorphan -guaifenesin , diclofenac  sodium, docusate sodium , escitalopram , famotidine , ferrous sulfate , fluticasone , folic acid , gabapentin , gabapentin , lactulose , levothyroxine , lidocaine , meclizine , nystatin, nystatin cream, olopatadine hcl, omeprazole, optive, pantoprazole , phenazopyridine , psyllium, quetiapine , symbicort, trazodone , trazodone , and gemtesa. Her primarily concern today is the Neck Pain and Back Pain (lower)  Initial Vital Signs:  Pulse/HCG Rate: (!) 59ECG Heart Rate: 62 Temp: (!) 97 F (36.1 C) Resp: 16 BP: (!) 118/59 SpO2: 98 %  BMI: Estimated body mass index is 40.62 kg/m as calculated from the following:   Height as  of this encounter: 5' 1 (1.549 m).   Weight as of this encounter: 215 lb (97.5 kg).  Risk Assessment: Allergies: Reviewed. She has no known allergies.  Allergy Precautions: None required Coagulopathies: Reviewed. None identified.  Blood-thinner therapy: None at this time Active Infection(s): Reviewed. None identified. Shelly Freeman is afebrile  Site Confirmation: [name] was asked to confirm the procedure and laterality before marking the site Procedure checklist: Completed Consent: Before the procedure and under  the influence of no sedative(s), amnesic(s), or anxiolytics, the patient was informed of the treatment options, risks and possible complications. To fulfill our ethical and legal obligations, as recommended by the American Medical Association's Code of Ethics, I have informed the patient of my clinical impression; the nature and purpose of the treatment or procedure; the risks, benefits, and possible complications of the intervention; the alternatives, including doing nothing; the risk(s) and benefit(s) of the alternative treatment(s) or procedure(s); and the risk(s) and benefit(s) of doing nothing. The patient was provided information about the general risks and possible complications associated with the procedure. These may include, but are not limited to: failure to achieve desired goals, infection, bleeding, organ or nerve damage, allergic reactions, paralysis, and death. In addition, the patient was informed of those risks and complications associated to Spine-related procedures, such as failure to decrease pain; infection (i.e.: Meningitis, epidural or intraspinal abscess); bleeding (i.e.: epidural hematoma, subarachnoid hemorrhage, or any other type of intraspinal or peri-dural bleeding); organ or nerve damage (i.e.: Any type of peripheral nerve, nerve root, or spinal cord injury) with subsequent damage to sensory, motor, and/or autonomic systems, resulting in permanent pain,  numbness, and/or weakness of one or several areas of the body; allergic reactions; (i.e.: anaphylactic reaction); and/or death. Furthermore, the patient was informed of those risks and complications associated with the medications. These include, but are not limited to: allergic reactions (i.e.: anaphylactic or anaphylactoid reaction(s)); adrenal axis suppression; blood sugar elevation that in diabetics may result in ketoacidosis or comma; water retention that in patients with history of congestive heart failure may result in shortness of breath, pulmonary edema, and decompensation with resultant heart failure; weight gain; swelling or edema; medication-induced neural toxicity; particulate matter embolism and blood vessel occlusion with resultant organ, and/or nervous system infarction; and/or aseptic necrosis of one or more joints. Finally, the patient was informed that Medicine is not an exact science; therefore, there is also the possibility of unforeseen or unpredictable risks and/or possible complications that may result in a catastrophic outcome. The patient indicated having understood very clearly. We have given the patient no guarantees and we have made no promises. Enough time was given to the patient to ask questions, all of which were answered to the patient's satisfaction. Ms. Ravert has indicated that she wanted to continue with the procedure. Attestation: I, the ordering provider, attest that I have discussed with the patient the benefits, risks, side-effects, alternatives, likelihood of achieving goals, and potential problems during recovery for the procedure that I have provided informed consent. Date  Time: 12/23/2023 11:17 AM  Pre-Procedure Preparation:  Monitoring: As per clinic protocol. Respiration, ETCO2, SpO2, BP, heart rate and rhythm monitor placed and checked for adequate function Safety Precautions: Patient was assessed for positional comfort and pressure points before starting  the procedure. Time-out: I initiated and conducted the Time-out before starting the procedure, as per protocol. The patient was asked to participate by confirming the accuracy of the Time Out information. Verification of the correct person, site, and procedure were performed and confirmed by me, the nursing staff, and the patient. Time-out conducted as per Joint Commission's Universal Protocol (UP.01.01.01). Time: 1212 Start Time: 1212 hrs.  Description  Narrative of Procedure:          Rationale (medical necessity): procedure needed and proper for the diagnosis and/or treatment of the patient's medical symptoms and needs. Start Time: 1212 hrs. Safety Precautions: Aspiration looking for blood return was conducted prior to all injections. At no point  did we inject any substances, as a needle was being advanced. No attempts were made at seeking any paresthesias. Safe injection practices and needle disposal techniques used. Medications properly checked for expiration dates. SDV (single dose vial) medications used. Description of procedure: Protocol guidelines were followed. The patient was assisted into a comfortable position. The target area was identified and the area prepped in the usual manner. Skin & deeper tissues infiltrated with local anesthetic. Appropriate amount of time allowed to pass for local anesthetics to take effect. Using fluoroscopic guidance, the epidural needle was introduced through the skin, ipsilateral to the reported pain, and advanced to the target area. Posterior laminar os was contacted and the needle walked caudad, until the lamina was cleared. The ligamentum flavum was engaged and the epidural space identified using "loss-of-resistance technique" with 2-3 ml of PF-NaCl (0.9% NSS), in a 5cc dedicated LOR syringe. (See Imaging guidance below for use of contrast details.) Once proper needle placement was secured, and negative aspiration confirmed, the solution was injected  in intermittent fashion, asking for systemic symptoms every 0.5cc. The needles were then removed and the area cleansed, making sure to leave some of the prepping solution back to take advantage of its long term bactericidal properties.  3 cc solution made of 1 cc of preservative-free saline,1 cc of 0.2% ropivacaine , 1 cc of Decadron  10 mg/cc.   Vitals:   12/23/23 1141 12/23/23 1210 12/23/23 1215 12/23/23 1220  BP: (!) 118/59 135/72 135/72 (!) 134/59  Pulse: (!) 59     Resp: 16 16 15 16   Temp: (!) 97 F (36.1 C)     TempSrc: Temporal     SpO2: 98% 96% 98% 99%  Weight: 215 lb (97.5 kg)     Height: 5' 1 (1.549 m)        End Time: 1223 hrs.  Imaging Guidance (Spinal):          Type of Imaging Technique: Fluoroscopy Guidance (Spinal) Indication(s): Fluoroscopy guidance for needle placement to enhance accuracy in procedures requiring precise needle localization for targeted delivery of medication in or near specific anatomical locations not easily accessible without such real-time imaging assistance. Exposure Time: Please see nurses notes. Contrast: Before injecting any contrast, we confirmed that the patient did not have an allergy to iodine, shellfish, or radiological contrast. Once satisfactory needle placement was completed at the desired level, radiological contrast was injected. Contrast injected under live fluoroscopy. No contrast complications. See chart for type and volume of contrast used. Fluoroscopic Guidance: I was personally present during the use of fluoroscopy. Tunnel Vision Technique used to obtain the best possible view of the target area. Parallax error corrected before commencing the procedure. Direction-depth-direction technique used to introduce the needle under continuous pulsed fluoroscopy. Once target was reached, antero-posterior, oblique, and lateral fluoroscopic projection used confirm needle placement in all planes. Images permanently stored in  EMR. Interpretation: I personally interpreted the imaging intraoperatively. Adequate needle placement confirmed in multiple planes. Appropriate spread of contrast into desired area was observed. No evidence of afferent or efferent intravascular uptake. No intrathecal or subarachnoid spread observed. Permanent images saved into the patient's record.  Post-operative Assessment:  Post-procedure Vital Signs:  Pulse/HCG Rate: (!) 5960 Temp: (!) 97 F (36.1 C) Resp: 16 BP: (!) 134/59 SpO2: 99 %  EBL: None  Complications: No immediate post-treatment complications observed by team, or reported by patient.  Note: The patient tolerated the entire procedure well. A repeat set of vitals were taken after the procedure and the  patient was kept under observation following institutional policy, for this type of procedure. Post-procedural neurological assessment was performed, showing return to baseline, prior to discharge. The patient was provided with post-procedure discharge instructions, including a section on how to identify potential problems. Should any problems arise concerning this procedure, the patient was given instructions to immediately contact us , at any time, without hesitation. In any case, we plan to contact the patient by telephone for a follow-up status report regarding this interventional procedure.  Comments:  No additional relevant information.  Plan of Care (POC)  Orders:  Orders Placed This Encounter  Procedures   DG PAIN CLINIC C-ARM 1-60 MIN NO REPORT    Intraoperative interpretation by procedural physician at [facility].    Standing Status:   Standing    Number of Occurrences:   1    Reason for exam::   Assistance in needle guidance and placement for procedures requiring needle placement in or near specific anatomical locations not easily accessible without such assistance.     Medications ordered for procedure: Meds ordered this encounter  Medications    iohexol  (OMNIPAQUE ) 180 MG/ML injection 10 mL    Must be Myelogram-compatible. If not available, you may substitute with a water-soluble, non-ionic, hypoallergenic, myelogram-compatible radiological contrast medium.   lidocaine  (XYLOCAINE ) 2 % (with pres) injection 400 mg   sodium chloride  flush (NS) 0.9 % injection 2 mL    This is for a two (2) level block. Use two (2) syringes and divide content in half.   ropivacaine  (PF) 2 mg/mL (0.2%) (NAROPIN ) injection 2 mL    This is for a two (2) level block. Use two (2) syringes and divide content in half.   dexamethasone  (DECADRON ) injection 20 mg    This is for a two (2) level block. Use two (2) syringes and divide content in half.   dexamethasone  (DECADRON ) injection 10 mg   lactated ringers  infusion   midazolam  (VERSED ) injection 0.5-2 mg    Make sure Flumazenil is available in the pyxis when using this medication. If oversedation occurs, administer 0.2 mg IV over 15 sec. If after 45 sec no response, administer 0.2 mg again over 1 min; may repeat at 1 min intervals; not to exceed 4 doses (1 mg)   Medications administered: We administered iohexol , lidocaine , sodium chloride  flush, ropivacaine  (PF) 2 mg/mL (0.2%), dexamethasone , dexamethasone , lactated ringers , and midazolam .  See the medical record for exact dosing, route, and time of administration.    Left cervical ESI, left L4, L5 transforaminal ESI 12/23/2023    Follow-up plan:   Return in about 3 weeks (around 01/13/2024) for PPE, F2F.     Recent Visits Date Type Provider Dept  12/05/23 Office Visit Marcelino Nurse, MD [facility]  Showing recent visits within past 90 days and meeting all other requirements Today's Visits Date Type Provider Dept  12/23/23 Procedure visit Marcelino Nurse, MD [facility]  Showing today's visits and meeting all other requirements Future Appointments Date Type Provider Dept  01/16/24 Appointment Marcelino Nurse, MD [facility]   Showing future appointments within next 90 days and meeting all other requirements   Disposition: Discharge home  Discharge (Date  Time): 12/23/2023;   hrs.   Primary Care Physician: Fleeta Pedro, Jill E, DO Location: [facility] Note by: Nurse Marcelino, MD (TTS technology used. I apologize for any typographical errors that were not detected and corrected.) Date: 12/23/2023; Time: 12:24 PM  Disclaimer:  Medicine is  not an Visual merchandiser. The only guarantee in medicine is that nothing is guaranteed. It is important to note that the decision to proceed with this intervention was based on the information collected from the patient. The Data and conclusions were drawn from the patient's questionnaire, the interview, and the physical examination. Because the information was provided in large part by the patient, it cannot be guaranteed that it has not been purposely or unconsciously manipulated. Every effort has been made to obtain as much relevant data as possible for this evaluation. It is important to note that the conclusions that lead to this procedure are derived in large part from the available data. Always take into account that the treatment will also be dependent on availability of resources and existing treatment guidelines, considered by other Pain Management Practitioners as being common knowledge and practice, at the time of the intervention. For Medico-Legal purposes, it is also important to point out that variation in procedural techniques and pharmacological choices are the acceptable norm. The indications, contraindications, technique, and results of the above procedure should only be interpreted and judged by a Board-Certified Interventional Pain Specialist with extensive familiarity and expertise in the same exact procedure and technique.

## 2023-12-23 NOTE — Progress Notes (Signed)
 Safety precautions to be maintained throughout the outpatient stay will include: orient to surroundings, keep bed in low position, maintain call bell within reach at all times, provide assistance with transfer out of bed and ambulation.   Patient stating she is having headache 6/10 . No food or drink for a long time. 0/10 lower TFESI

## 2023-12-23 NOTE — Patient Instructions (Signed)

## 2023-12-23 NOTE — Progress Notes (Signed)
 PROVIDER NOTE: Interpretation of information contained herein should be left to medically-trained personnel. Specific patient instructions are provided elsewhere under Patient Instructions section of medical record. This document was created in part using STT-dictation technology, any transcriptional errors that may result from this process are unintentional.  Patient: Shelly Freeman Type: Established DOB: 05-13-1950 MRN: [id number] PCP: Fleeta Pedro, Kate BRAVO, DO  Service: Procedure DOS: 12/23/2023 Setting: Ambulatory Location: Ambulatory outpatient facility Delivery: Face-to-face Provider: Wallie Sherry, MD Specialty: Interventional Pain Management Specialty designation: 09 Location: Outpatient facility Ref. Prov.: Sherry Wallie, MD       Interventional Therapy   Procedure: Lumbar trans-foraminal epidural steroid injection (L-TFESI) #1  Laterality: Left (-LT)  Level: L4 and L5 nerve root(s) Imaging: Fluoroscopy-guided         Anesthesia: Local anesthesia (1-2% Lidocaine ) Sedation: Minimal Sedation                       DOS: 12/23/2023  Performed by: Wallie Sherry, MD  Purpose: Diagnostic/Therapeutic Indications: Lumbar radicular pain severe enough to impact quality of life or function. 1. Cervical radicular pain   2. Chronic radicular lumbar pain   3. Lumbar radiculopathy    NAS-11 Pain score:   Pre-procedure: 6 /10   Post-procedure: 6  (6/10 headache blurred , 0/10 lower , cola given long time since she had ate or drunk, patient had versed  2 mg before procedure)/10     Position / Prep / Materials:  Position: Prone  Prep solution: ChloraPrep (2% chlorhexidine  gluconate and 70% isopropyl alcohol ) Prep Area: Entire Posterior Lumbosacral Area.  From the lower tip of the scapula down to the tailbone and from flank to flank. Materials:  Tray: Block Needle(s):  Type: Spinal  Gauge (G): 22  Length: 5-in  Qty: 2     H&P (Pre-op Assessment):  Shelly Freeman is a 73 y.o. (year old),  female patient, seen today for interventional treatment. 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 Shelly Freeman has a current medication list which includes the following prescription(s): acetaminophen , albuterol , alendronate , mintox maximum strength, atorvastatin , benzonatate, buprenorphine , buspirone , vicks vaporub, cholecalciferol , cyanocobalamin , dextromethorphan -guaifenesin , diclofenac  sodium, docusate sodium , escitalopram , famotidine , ferrous sulfate , fluticasone , folic acid , gabapentin , gabapentin , lactulose , levothyroxine , lidocaine , meclizine , nystatin, nystatin cream, olopatadine hcl, omeprazole, optive, pantoprazole , phenazopyridine , psyllium, quetiapine , symbicort, trazodone , trazodone , and gemtesa. Her primarily concern today is the Neck Pain and Back Pain (lower)  Initial Vital Signs:  Pulse/HCG Rate: (!) 59ECG Heart Rate: 62 Temp: (!) 97 F (36.1 C) Resp: 16 BP: (!) 118/59 SpO2: 98 %  BMI: Estimated body mass index is 40.62 kg/m as calculated from the following:   Height as of this encounter: 5' 1 (1.549 m).   Weight as of this encounter: 215 lb (97.5 kg).  Risk Assessment: Allergies: Reviewed. She has no known allergies.  Allergy Precautions: None required Coagulopathies: Reviewed. None identified.  Blood-thinner therapy: None at this time Active Infection(s): Reviewed. None  identified. Shelly Freeman is afebrile  Site Confirmation: Shelly Freeman was asked to confirm the procedure and laterality before marking the site Procedure checklist: Completed Consent: Before the procedure and under  the influence of no sedative(s), amnesic(s), or anxiolytics, the patient was informed of the treatment options, risks and possible complications. To fulfill our ethical and legal obligations, as recommended by the American Medical Association's Code of Ethics, I have informed the patient of my clinical impression; the nature and purpose of the treatment or procedure; the risks, benefits, and possible complications of the intervention; the alternatives, including doing nothing; the risk(s) and benefit(s) of the alternative treatment(s) or procedure(s); and the risk(s) and benefit(s) of doing nothing. The patient was provided information about the general risks and possible complications associated with the procedure. These may include, but are not limited to: failure to achieve desired goals, infection, bleeding, organ or nerve damage, allergic reactions, paralysis, and death. In addition, the patient was informed of those risks and complications associated to Spine-related procedures, such as failure to decrease pain; infection (i.e.: Meningitis, epidural or intraspinal abscess); bleeding (i.e.: epidural hematoma, subarachnoid hemorrhage, or any other type of intraspinal or peri-dural bleeding); organ or nerve damage (i.e.: Any type of peripheral nerve, nerve root, or spinal cord injury) with subsequent damage to sensory, motor, and/or autonomic systems, resulting in permanent pain, numbness, and/or weakness of one or several areas of the body; allergic reactions; (i.e.: anaphylactic reaction); and/or death. Furthermore, the patient was informed of those risks and complications associated with the medications. These include, but are not limited to: allergic reactions (i.e.: anaphylactic or  anaphylactoid reaction(s)); adrenal axis suppression; blood sugar elevation that in diabetics may result in ketoacidosis or comma; water retention that in patients with history of congestive heart failure may result in shortness of breath, pulmonary edema, and decompensation with resultant heart failure; weight gain; swelling or edema; medication-induced neural toxicity; particulate matter embolism and blood vessel occlusion with resultant organ, and/or nervous system infarction; and/or aseptic necrosis of one or more joints. Finally, the patient was informed that Medicine is not an exact science; therefore, there is also the possibility of unforeseen or unpredictable risks and/or possible complications that may result in a catastrophic outcome. The patient indicated having understood very clearly. We have given the patient no guarantees and we have made no promises. Enough time was given to the patient to ask questions, all of which were answered to the patient's satisfaction. Ms. Limehouse has indicated that she wanted to continue with the procedure. Attestation: I, the ordering provider, attest that I have discussed with the patient the benefits, risks, side-effects, alternatives, likelihood of achieving goals, and potential problems during recovery for the procedure that I have provided informed consent. Date  Time: 12/23/2023 11:17 AM  Pre-Procedure Preparation:  Monitoring: As per clinic protocol. Respiration, ETCO2, SpO2, BP, heart rate and rhythm monitor placed and checked for adequate function Safety Precautions: Patient was assessed for positional comfort and pressure points before starting the procedure. Time-out: I initiated and conducted the Time-out before starting the procedure, as per protocol. The patient was asked to participate by confirming the accuracy of the Time Out information. Verification of the correct person, site, and procedure were performed and confirmed by me, the nursing  staff, and the patient. Time-out conducted as per Joint Commission's Universal Protocol (UP.01.01.01). Time: 1212 Start Time: 1212 hrs.  Description/Narrative of Procedure:          Target: The 6 o'clock position under the pedicle, on the affected side. Region: Posterolateral Lumbosacral Approach: Posterior Percutaneous Paravertebral approach.  Rationale (medical necessity): procedure needed and proper for the diagnosis and/or treatment of the patient's medical symptoms and needs. Procedural  Technique Safety Precautions: Aspiration looking for blood return was conducted prior to all injections. At no point did we inject any substances, as a needle was being advanced. No attempts were made at seeking any paresthesias. Safe injection practices and needle disposal techniques used. Medications properly checked for expiration dates. SDV (single dose vial) medications used. Description of the Procedure: Protocol guidelines were followed. The patient was placed in position over the procedure table. The target area was identified and the area prepped in the usual manner. Skin & deeper tissues infiltrated with local anesthetic. Appropriate amount of time allowed to pass for local anesthetics to take effect. The procedure needles were then advanced to the target area. Proper needle placement secured. Negative aspiration confirmed. Solution injected in intermittent fashion, asking for systemic symptoms every 0.5cc of injectate. The needles were then removed and the area cleansed, making sure to leave some of the prepping solution back to take advantage of its long term bactericidal properties.  5 cc solution made of 2 cc of preservative-free saline, 2 cc of 0.2% ropivacaine , 2 cc of Decadron  10 mg/cc. 3 cc injected for the left L4 nerve, 3 cc injected for the left L5 nerve   Vitals:   12/23/23 1141 12/23/23 1210 12/23/23 1215 12/23/23 1220  BP: (!) 118/59 135/72 135/72 (!) 134/59  Pulse: (!) 59     Resp:  16 16 15 16   Temp: (!) 97 F (36.1 C)     TempSrc: Temporal     SpO2: 98% 96% 98% 99%  Weight: 215 lb (97.5 kg)     Height: 5' 1 (1.549 m)       Start Time: 1212 hrs. End Time: 1223 hrs.  Imaging Guidance (Spinal):          Type of Imaging Technique: Fluoroscopy Guidance (Spinal) Indication(s): Fluoroscopy guidance for needle placement to enhance accuracy in procedures requiring precise needle localization for targeted delivery of medication in or near specific anatomical locations not easily accessible without such real-time imaging assistance. Exposure Time: Please see nurses notes. Contrast: Before injecting any contrast, we confirmed that the patient did not have an allergy to iodine, shellfish, or radiological contrast. Once satisfactory needle placement was completed at the desired level, radiological contrast was injected. Contrast injected under live fluoroscopy. No contrast complications. See chart for type and volume of contrast used. Fluoroscopic Guidance: I was personally present during the use of fluoroscopy. Tunnel Vision Technique used to obtain the best possible view of the target area. Parallax error corrected before commencing the procedure. Direction-depth-direction technique used to introduce the needle under continuous pulsed fluoroscopy. Once target was reached, antero-posterior, oblique, and lateral fluoroscopic projection used confirm needle placement in all planes. Images permanently stored in EMR. Interpretation: I personally interpreted the imaging intraoperatively. Adequate needle placement confirmed in multiple planes. Appropriate spread of contrast into desired area was observed. No evidence of afferent or efferent intravascular uptake. No intrathecal or subarachnoid spread observed. Permanent images saved into the patient's record.  Post-operative Assessment:  Post-procedure Vital Signs:  Pulse/HCG Rate: (!) 5960 Temp: (!) 97 F (36.1 C) Resp: 16 BP: (!)  134/59 SpO2: 99 %  EBL: None  Complications: No immediate post-treatment complications observed by team, or reported by patient.  Note: The patient tolerated the entire procedure well. A repeat set of vitals were taken after the procedure and the patient was kept under observation following institutional policy, for this type of procedure. Post-procedural neurological assessment was performed, showing return to baseline, prior to discharge.  The patient was provided with post-procedure discharge instructions, including a section on how to identify potential problems. Should any problems arise concerning this procedure, the patient was given instructions to immediately contact us , at any time, without hesitation. In any case, we plan to contact the patient by telephone for a follow-up status report regarding this interventional procedure.  Comments:  No additional relevant information.  Plan of Care (POC)  Orders:  Orders Placed This Encounter  Procedures   DG PAIN CLINIC C-ARM 1-60 MIN NO REPORT    Intraoperative interpretation by procedural physician at [facility].    Standing Status:   Standing    Number of Occurrences:   1    Reason for exam::   Assistance in needle guidance and placement for procedures requiring needle placement in or near specific anatomical locations not easily accessible without such assistance.     Medications ordered for procedure: Meds ordered this encounter  Medications   iohexol  (OMNIPAQUE ) 180 MG/ML injection 10 mL    Must be Myelogram-compatible. If not available, you may substitute with a water-soluble, non-ionic, hypoallergenic, myelogram-compatible radiological contrast medium.   lidocaine  (XYLOCAINE ) 2 % (with pres) injection 400 mg   sodium chloride  flush (NS) 0.9 % injection 2 mL    This is for a two (2) level block. Use two (2) syringes and divide content in half.   ropivacaine  (PF) 2 mg/mL (0.2%) (NAROPIN ) injection 2 mL    This is for  a two (2) level block. Use two (2) syringes and divide content in half.   dexamethasone  (DECADRON ) injection 20 mg    This is for a two (2) level block. Use two (2) syringes and divide content in half.   dexamethasone  (DECADRON ) injection 10 mg   lactated ringers  infusion   midazolam  (VERSED ) injection 0.5-2 mg    Make sure Flumazenil is available in the pyxis when using this medication. If oversedation occurs, administer 0.2 mg IV over 15 sec. If after 45 sec no response, administer 0.2 mg again over 1 min; may repeat at 1 min intervals; not to exceed 4 doses (1 mg)   Medications administered: We administered iohexol , lidocaine , sodium chloride  flush, ropivacaine  (PF) 2 mg/mL (0.2%), dexamethasone , dexamethasone , lactated ringers , and midazolam .  See the medical record for exact dosing, route, and time of administration.    Left cervical ESI, left L4, L5 transforaminal ESI 12/23/2023    Follow-up plan:   Return in about 3 weeks (around 01/13/2024) for PPE, F2F.     Recent Visits Date Type Provider Dept  12/05/23 Office Visit Marcelino Nurse, MD [facility]  Showing recent visits within past 90 days and meeting all other requirements Today's Visits Date Type Provider Dept  12/23/23 Procedure visit Marcelino Nurse, MD [facility]  Showing today's visits and meeting all other requirements Future Appointments Date Type Provider Dept  01/16/24 Appointment Marcelino Nurse, MD [facility]  Showing future appointments within next 90 days and meeting all other requirements   Disposition: Discharge home  Discharge (Date  Time): 12/23/2023; 1240 hrs.   Primary Care Physician: Fleeta Pedro, Jill E, DO Location: [facility] Note by: Nurse Marcelino, MD (TTS technology used. I apologize for any typographical errors that were not detected and corrected.) Date: 12/23/2023; Time: 1:22 PM  Disclaimer:  Medicine is not an Visual merchandiser. The only  guarantee in medicine is that nothing is guaranteed. It is important to note that the decision to proceed with this  intervention was based on the information collected from the patient. The Data and conclusions were drawn from the patient's questionnaire, the interview, and the physical examination. Because the information was provided in large part by the patient, it cannot be guaranteed that it has not been purposely or unconsciously manipulated. Every effort has been made to obtain as much relevant data as possible for this evaluation. It is important to note that the conclusions that lead to this procedure are derived in large part from the available data. Always take into account that the treatment will also be dependent on availability of resources and existing treatment guidelines, considered by other Pain Management Practitioners as being common knowledge and practice, at the time of the intervention. For Medico-Legal purposes, it is also important to point out that variation in procedural techniques and pharmacological choices are the acceptable norm. The indications, contraindications, technique, and results of the above procedure should only be interpreted and judged by a Board-Certified Interventional Pain Specialist with extensive familiarity and expertise in the same exact procedure and technique.

## 2023-12-24 ENCOUNTER — Telehealth: Payer: Self-pay

## 2023-12-24 NOTE — Telephone Encounter (Signed)
 Post procedure follow up. Facility states she is doing good.

## 2024-01-16 ENCOUNTER — Ambulatory Visit
Attending: Student in an Organized Health Care Education/Training Program | Admitting: Student in an Organized Health Care Education/Training Program

## 2024-01-16 ENCOUNTER — Encounter: Payer: Self-pay | Admitting: Student in an Organized Health Care Education/Training Program

## 2024-01-16 VITALS — BP 113/76 | HR 60 | Temp 97.9°F | Resp 16 | Ht 61.0 in | Wt 218.0 lb

## 2024-01-16 DIAGNOSIS — G894 Chronic pain syndrome: Secondary | ICD-10-CM | POA: Insufficient documentation

## 2024-01-16 DIAGNOSIS — M5412 Radiculopathy, cervical region: Secondary | ICD-10-CM | POA: Diagnosis present

## 2024-01-16 NOTE — Progress Notes (Signed)
 Safety precautions to be maintained throughout the outpatient stay will include: orient to surroundings, keep bed in low position, maintain call bell within reach at all times, provide assistance with transfer out of bed and ambulation.

## 2024-01-16 NOTE — Progress Notes (Signed)
 PROVIDER NOTE: Interpretation of information contained herein should be left to medically-trained personnel. Specific patient instructions are provided elsewhere under Patient Instructions section of medical record. This document was created in part using AI and STT-dictation technology, any transcriptional errors that may result from this process are unintentional.  Patient: Shelly Freeman  Service: E/M   PCP: Fleeta Pedro, Kate BRAVO, DO  DOB: April 02, 1951  DOS: 01/16/2024  Provider: Wallie Sherry, MD  MRN: [id number]  Delivery: Face-to-face  Specialty: Interventional Pain Management  Type: Established Patient  Setting: Ambulatory outpatient facility  Specialty designation: 09  Referring Prov.: Fleeta Pedro, Jill E, DO  Location: Outpatient office facility       History of present illness (HPI) Ms. Shelly Freeman, a 73 y.o. year old female, is here today because of her Cervical radicular pain [M54.12]. Ms. Shelly Freeman primary complain today is Shoulder Pain (left)  Pertinent problems: Ms. Shelly Freeman does not have any pertinent problems on file.  Pain Assessment: Severity of Chronic pain is reported as a 4 /10. Location: Shoulder Left/radiates into left upper arm. Onset: More than a month ago. Quality: Aching, Sharp. Timing: Constant. Modifying factor(s): meds, heat. Vitals:  height is 5' 1 (1.549 m) and weight is 218 lb (98.9 kg). Her temperature is 97.9 F (36.6 C). Her blood pressure is 113/76 and her pulse is 60. Her respiration is 16 and oxygen saturation is 95%.  BMI: Estimated body mass index is 41.19 kg/m as calculated from the following:   Height as of this encounter: 5' 1 (1.549 m).   Weight as of this encounter: 218 lb (98.9 kg).  Last encounter: 12/05/2023. Last procedure: 12/23/2023.  Reason for encounter: PPE  Post-Procedure Evaluation   Procedure: #1 Lumbar trans-foraminal epidural steroid injection (L-TFESI) #1  Laterality: Left (-LT)  Level: L4 and L5 nerve root(s) Imaging:  Fluoroscopy-guided         Anesthesia: Local anesthesia (1-2% Lidocaine ) Sedation: Minimal Sedation                       DOS: 12/23/2023  Performed by: Wallie Sherry, MD  Procedure #2 Type: Cervical Epidural Steroid injection (CESI) (Interlaminar) #1  Laterality: Left (-LT)  Level: C7-T1 DOS: 12/23/2023  Provider: Wallie Sherry, MD Imaging: Fluoroscopy-guided Spinal ([id number]) Anesthesia: Local anesthesia (1-2% Lidocaine ) Sedation: Minimal Sedation                        Purpose: Diagnostic/Therapeutic Indications: Lumbar radicular pain severe enough to impact quality of life or function. 1. Cervical radicular pain   2. Chronic radicular lumbar pain   3. Lumbar radiculopathy    NAS-11 Pain score:   Pre-procedure: 6 /10   Post-procedure: 6  (6/10 headache blurred , 0/10 lower , cola given long time since she had ate or drunk, patient had versed  2 mg before procedure)/10     Effectiveness:  Initial hour after procedure: 100 %  Subsequent 4-6 hours post-procedure: 100 %  Analgesia past initial 6 hours: 75 % (ongoing)  Ongoing improvement:  Analgesic:  75% Function: Somewhat improved ROM: Somewhat improved   No results found for: D9THCCBX  ROS  Constitutional: Denies any fever or chills Gastrointestinal: No reported hemesis, hematochezia, vomiting, or acute GI distress Musculoskeletal: Denies any acute onset joint swelling, redness, loss of ROM, or weakness Neurological: Left neck pain  Medication Review  Dextromethorphan -guaiFENesin , Olopatadine HCl, Psyllium, QUEtiapine , VICKS VAPORUB, Vibegron, acetaminophen , albuterol , alendronate , alum & mag hydroxide-simeth, atorvastatin , benzonatate, budesonide -formoterol , buprenorphine , busPIRone ,  carboxymethylcellul-glycerin , cholecalciferol , cyanocobalamin , diclofenac  Sodium, docusate sodium , escitalopram , famotidine , ferrous sulfate , fluticasone , folic acid , gabapentin , lactulose , levothyroxine , lidocaine , meclizine , nystatin,  nystatin cream, omeprazole, pantoprazole , phenazopyridine , and traZODone   History Review  Allergy: Ms. Shelly Freeman has no known allergies. Drug: Ms. Shelly Freeman  reports no history of drug use. Alcohol :  reports no history of alcohol  use. Tobacco:  reports that she has never smoked. She has never been exposed to tobacco smoke. She has never used smokeless tobacco. Social: Ms. Shelly Freeman  reports that she has never smoked. She has never been exposed to tobacco smoke. She has never used smokeless tobacco. She reports that she does not drink alcohol  and does not use drugs. Medical:  has a past medical history of Acute respiratory failure with hypoxia [facility] Age related osteoporosis, Angina at rest [facility]), Anginal pain (HCC), Arthritis, Asthma, CAD (coronary artery disease), Cerebral microvascular disease, Cervical disc disorder with radiculopathy, Chronic pain syndrome, COPD (chronic obstructive pulmonary disease) (HCC), DDD (degenerative disc disease), lumbar (05/14/2016), Depression, Diastolic dysfunction, Dizziness, Dyspnea, GERD (gastroesophageal reflux disease), Gram-negative bacteremia (E.coli) (01/26/2021), Heart valve problem, Hepatomegaly, Hiatal hernia, History of 2019 novel coronavirus disease (COVID-19), History of bilateral cataract extraction (11/2021), History of infection due to ESBL Escherichia coli, History of kidney stones, History of Roux-en-Y gastric bypass, HLD (hyperlipidemia), Hypertension, Hypothyroidism, IDA (iron deficiency anemia), Insomnia, Intentional opioid overdose (HCC), 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 Surgical: Ms. Shelly Freeman  has a past surgical history that includes Gastric bypass; Appendectomy; Cesarean section; Tubal ligation; Colonoscopy with propofol  (N/A,  07/01/2017); Esophagogastroduodenoscopy (egd) with propofol  (N/A, 07/01/2017); Lithotripsy; Breast biopsy (Left); Total knee arthroplasty (Left, 10/25/2020); Colonoscopy (N/A, 08/24/2021); Cataract extraction w/PHACO (Left, 11/13/2021); Cataract extraction w/PHACO (Right, 12/04/2021); Lipoma excision (Left, 06/14/2022); IR INJECT DIAG/THERA/INC NEEDLE/CATH/PLC EPI/LUMB/SAC W/IMG (02/12/2023); Nissen fundoplication (N/A); Coronary angioplasty with stent; and Total knee arthroplasty (Right, 04/25/2023). Family: family history includes Breast cancer in her cousin and maternal aunt.  Laboratory Chemistry Profile   Renal Lab Results  Component Value Date   BUN 21 10/15/2023   CREATININE 1.04 (H) 10/15/2023   BCR 16 11/09/2022   GFRAA >60 11/15/2019   GFRNONAA 57 (L) 10/15/2023    Hepatic Lab Results  Component Value Date   AST 33 10/12/2023   ALT 31 10/12/2023   ALBUMIN 4.0 10/12/2023   ALKPHOS 98 10/12/2023   LIPASE 30 10/12/2023   AMMONIA 17 04/10/2016    Electrolytes Lab Results  Component Value Date   NA 139 10/15/2023   K 3.7 10/15/2023   CL 105 10/15/2023   CALCIUM  9.2 10/15/2023   MG 2.3 10/15/2023   PHOS 5.0 (H) 10/15/2023    Bone No results found for: VD25OH, VD125OH2TOT, CI6874NY7, CI7874NY7, 25OHVITD1, 25OHVITD2, 25OHVITD3, TESTOFREE, TESTOSTERONE  Inflammation (CRP: Acute Phase) (ESR: Chronic Phase) Lab Results  Component Value Date   CRP 13.8 (H) 01/29/2019   ESRSEDRATE 28 01/29/2019   LATICACIDVEN 1.8 10/12/2023         Note: Above Lab results reviewed.  Recent Imaging Review  arrative CLINICAL DATA:  Neck trauma.   EXAM: CT CERVICAL SPINE WITHOUT CONTRAST   TECHNIQUE: Multidetector CT imaging of the cervical spine was performed without intravenous contrast. Multiplanar CT image reconstructions were also generated.   RADIATION DOSE REDUCTION: This exam was performed according to the departmental dose-optimization program which  includes automated exposure control, adjustment of the mA and/or kV  according to patient size and/or use of iterative reconstruction technique.   COMPARISON:  CT of the cervical spine 05/17/2021   FINDINGS: Alignment: Vertebral body heights alignment are normal. Straightening of the normal cervical lordosis is stable.   Skull base and vertebrae: The craniocervical junction is normal. Vertebral body heights are normal. No acute fractures are present.   Soft tissues and spinal canal: No prevertebral fluid or swelling. No visible canal hematoma.   Disc levels: Progressive endplate changes are present at C6-7, right greater than left. Moderate right and mild left foraminal stenosis is present at C6-7.   Upper chest: The lung apices are clear. The thoracic inlet is within normal limits.   IMPRESSION: 1. No acute fracture or traumatic subluxation. 2. Progressive endplate changes at C6-7, right greater than left. 3. Moderate right and mild left foraminal stenosis at C6-7.     Electronically Signed By: Lonni Necessary M.D. On: 12/13/2022 08:24 Note: Reviewed        Physical Exam  Vitals: BP 113/76   Pulse 60   Temp 97.9 F (36.6 C)   Resp 16   Ht 5' 1 (1.549 m)   Wt 218 lb (98.9 kg)   SpO2 95%   BMI 41.19 kg/m  BMI: Estimated body mass index is 41.19 kg/m as calculated from the following:   Height as of this encounter: 5' 1 (1.549 m).   Weight as of this encounter: 218 lb (98.9 kg). Ideal: Ideal body weight: 47.8 kg (105 lb 6.1 oz) Adjusted ideal body weight: 68.2 kg (150 lb 6.8 oz) General appearance: Well nourished, well developed, and well hydrated. In no apparent acute distress Mental status: Alert, oriented x 3 (person, place, & time)       Respiratory: No evidence of acute respiratory distress Eyes: PERLA  Cervical Spine Area Exam  Skin & Axial Inspection: No masses, redness, edema, swelling, or associated skin lesions Alignment: Symmetrical Functional  ROM: Pain restricted ROM      Stability: No instability detected Muscle Tone/Strength: Functionally intact. No obvious neuro-muscular anomalies detected. Sensory (Neurological): Dermatomal pain pattern LEFT improved from before Palpation: No palpable anomalies             Upper Extremity (UE) Exam      Side: Right upper extremity   Side: Left upper extremity  Skin & Extremity Inspection: Skin color, temperature, and hair growth are WNL. No peripheral edema or cyanosis. No masses, redness, swelling, asymmetry, or associated skin lesions. No contractures.   Skin & Extremity Inspection: Skin color, temperature, and hair growth are WNL. No peripheral edema or cyanosis. No masses, redness, swelling, asymmetry, or associated skin lesions. No contractures.  Functional ROM: Unrestricted ROM           Functional ROM: Pain restricted ROM for all joints of upper extremity, improved from before  Muscle Tone/Strength: Functionally intact. No obvious neuro-muscular anomalies detected.   Muscle Tone/Strength: Functionally intact. No obvious neuro-muscular anomalies detected.  Sensory (Neurological): Unimpaired           Sensory (Neurological): Dermatomal pain pattern          Palpation: No palpable anomalies               Palpation: No palpable anomalies              Provocative Test(s):  Phalen's test: deferred Tinel's test: deferred Apley's scratch test (touch opposite shoulder):  Action 1 (Across chest): deferred Action 2 (Overhead): deferred Action 3 (LB reach): deferred  Provocative Test(s):  Phalen's test: deferred Tinel's test: deferred Apley's scratch test (touch opposite shoulder):  Action 1 (Across chest): Decreased ROM Action 2 (Overhead): Decreased ROM Action 3 (LB reach): Decreased ROM      Assessment   Diagnosis  1. Cervical radicular pain   2. Chronic pain syndrome      Updated Problems: Positive response to cervical epidural steroid injection and lumbar transforaminal ESI.   Patient states that her radiating leg pain is much better.  She states that her radiating left cervical spine left shoulder and left arm pain are reduced but still present.  She would like to repeat the injection and I told her that we can repeat it every 3 months.  Next injection to be performed tentatively around November 12 should be 3 months from her last injection.      Orders:  Orders Placed This Encounter  Procedures   Cervical Epidural Injection    Sedation: Patient's choice. Purpose: Diagnostic/Therapeutic Indication(s): Radiculitis and cervicalgia associater with cervical degenerative disc disease.    Standing Status:   Future    Expected Date:   03/18/2024    Expiration Date:   01/15/2025    Scheduling Instructions:     Procedure: Cervical Epidural Steroid Injection/Block     Level(s): C7-T1     Laterality: LEFT     Timeframe: As soon as schedule allows.    Where will this procedure be performed?:   [facility]             Shelly Freeman     Left cervical ESI, left L4, L5 transforaminal ESI 12/23/2023    Return in about 2 months (around 03/16/2024) for Left C-ESI, in clinic (PO Valium 5mg ).    Recent Visits Date Type Provider Dept  12/23/23 Procedure visit Marcelino Nurse, MD [facility]  12/05/23 Office Visit Marcelino Nurse, MD [facility]  Showing recent visits within past 90 days and meeting all other requirements Today's Visits Date Type Provider Dept  01/16/24 Office Visit Marcelino Nurse, MD [facility]  Showing today's visits and meeting all other requirements Future Appointments No visits were found meeting these conditions. Showing future appointments within next 90 days and meeting all other requirements  I discussed the assessment and treatment plan with the patient. The patient was provided an opportunity to ask questions and all were answered. The patient agreed with the plan and demonstrated an understanding of the  instructions.  Patient advised to call back or seek an in-person evaluation if the symptoms or condition worsens.  Duration of encounter: 20 minutes.  Total time on encounter, as per AMA guidelines included both the face-to-face and non-face-to-face time personally spent by the physician and/or other qualified health care professional(s) on the day of the encounter (includes time in activities that require the physician or other qualified health care professional and does not include time in activities normally performed by clinical staff). Physician's time may include the following activities when performed: Preparing to see the patient (e.g., pre-charting review of records, searching for previously ordered imaging, lab work, and nerve conduction tests) Review of prior analgesic pharmacotherapies. Reviewing PMP Interpreting ordered tests (e.g., lab work, imaging, nerve conduction tests) Performing post-procedure evaluations, including interpretation of diagnostic procedures Obtaining and/or reviewing separately obtained history Performing a medically appropriate examination and/or evaluation Counseling and educating the patient/family/caregiver Ordering medications, tests, or procedures Referring and communicating with other health care professionals (when not separately reported) Documenting clinical information in the electronic or  other health record Independently interpreting results (not separately reported) and communicating results to the patient/ family/caregiver Care coordination (not separately reported)  Note by: Wallie Sherry, MD (TTS and AI technology used. I apologize for any typographical errors that were not detected and corrected.) Date: 01/16/2024; Time: 11:06 AM

## 2024-01-16 NOTE — Patient Instructions (Signed)

## 2024-02-13 ENCOUNTER — Other Ambulatory Visit: Payer: Self-pay | Admitting: Family Medicine

## 2024-02-13 DIAGNOSIS — Z13820 Encounter for screening for osteoporosis: Secondary | ICD-10-CM

## 2024-02-24 ENCOUNTER — Ambulatory Visit
Admission: RE | Admit: 2024-02-24 | Discharge: 2024-02-24 | Disposition: A | Discharge status: Home / Self Care | Source: Ambulatory Visit | Attending: Student in an Organized Health Care Education/Training Program | Admitting: Student in an Organized Health Care Education/Training Program

## 2024-02-24 ENCOUNTER — Ambulatory Visit (HOSPITAL_BASED_OUTPATIENT_CLINIC_OR_DEPARTMENT_OTHER): Admitting: Student in an Organized Health Care Education/Training Program

## 2024-02-24 DIAGNOSIS — M5412 Radiculopathy, cervical region: Secondary | ICD-10-CM | POA: Insufficient documentation

## 2024-02-24 DIAGNOSIS — G894 Chronic pain syndrome: Secondary | ICD-10-CM | POA: Diagnosis present

## 2024-02-24 MED ORDER — DIAZEPAM 5 MG PO TABS
ORAL_TABLET | ORAL | Status: AC
Start: 2024-02-24 — End: 2024-02-24
  Filled 2024-02-24: qty 1

## 2024-02-24 MED ORDER — SODIUM CHLORIDE (PF) 0.9 % IJ SOLN
INTRAMUSCULAR | Status: AC
  Filled 2024-02-24: qty 10

## 2024-02-24 MED ORDER — DIAZEPAM 5 MG PO TABS
5.0000 mg | ORAL_TABLET | ORAL | Status: AC
  Administered 2024-02-24: 5 mg via ORAL

## 2024-02-24 MED ORDER — DEXAMETHASONE SOD PHOSPHATE PF 10 MG/ML IJ SOLN
10.0000 mg | Freq: Once | INTRAMUSCULAR | Status: AC
  Administered 2024-02-24: 10 mg

## 2024-02-24 MED ORDER — ROPIVACAINE HCL 2 MG/ML IJ SOLN
1.0000 mL | Freq: Once | INTRAMUSCULAR | Status: AC
  Administered 2024-02-24: 20 mL via EPIDURAL
  Filled 2024-02-24: qty 20

## 2024-02-24 MED ORDER — IOHEXOL 180 MG/ML  SOLN
10.0000 mL | Freq: Once | INTRAMUSCULAR | Status: AC
  Administered 2024-02-24: 10 mL via EPIDURAL
  Filled 2024-02-24: qty 20

## 2024-02-24 MED ORDER — LIDOCAINE HCL 2 % IJ SOLN
20.0000 mL | Freq: Once | INTRAMUSCULAR | Status: AC
  Administered 2024-02-24: 400 mg
  Filled 2024-02-24: qty 20

## 2024-02-24 MED ORDER — SODIUM CHLORIDE 0.9% FLUSH
1.0000 mL | Freq: Once | INTRAVENOUS | Status: AC
  Administered 2024-02-24: 10 mL

## 2024-02-24 NOTE — Progress Notes (Signed)
 PROVIDER NOTE: Interpretation of information contained herein should be left to medically-trained personnel. Specific patient instructions are provided elsewhere under Patient Instructions section of medical record. This document was created in part using STT-dictation technology, any transcriptional errors that may result from this process are unintentional.  Patient: Shelly Freeman Type: Established DOB: 1950/07/26 MRN: [id number] PCP: Fleeta Pedro, Kate BRAVO, DO  Service: Procedure DOS: 02/24/2024 Setting: Ambulatory Location: Ambulatory outpatient facility Delivery: Face-to-face Provider: Wallie Sherry, MD Specialty: Interventional Pain Management Specialty designation: 09 Location: Outpatient facility Ref. Prov.: Sherry Wallie, MD       Interventional Therapy   Type: Cervical Epidural Steroid injection (CESI) (Interlaminar) #2  Laterality: Left (-LT)  Level: C7-T1 DOS: 02/24/2024  Provider: Wallie Sherry, MD Imaging: Fluoroscopy-guided Spinal ([id number]) Anesthesia: Local anesthesia (1-2% Lidocaine ) Sedation: Valium 5 mg PO  Medical Necessity: Cervical Radicular pain Purpose: Diagnostic/Therapeutic Rationale (medical necessity): procedure needed and proper for the diagnosis and/or treatment of Shelly Freeman's medical symptoms and needs. Indications: Cervicalgia, cervical radicular pain, degenerative disc disease, severe enough to impact quality of life or function.  NAS-11 Pain score:   Pre-procedure: 5 /10   Post-procedure: 0-No pain/10     Position  Prep  Materials:  Location setting: Procedure suite Position: Prone, on modified reverse trendelenburg to facilitate breathing, with head in head-cradle. Pillows positioned under chest (below chin-level) with cervical spine flexed. Safety Precautions: Patient was assessed for positional comfort and pressure points before starting the procedure. Prepping solution: DuraPrep (Iodine Povacrylex [0.7% available iodine] and Isopropyl  Alcohol , 74% w/w) Prep Area: Entire  cervicothoracic region Approach: percutaneous, paramedial Intended target: Posterior cervical epidural space Materials Procedure:  Tray: Epidural Needle(s): Epidural (Tuohy) Qty: 1 Length: (90mm) 3.5-inch Gauge: 22G  H&P (Pre-op Assessment):  Shelly Freeman is a 73 y.o. (year old), female patient, seen today for interventional treatment. 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 Shelly Freeman has a current medication list which includes the following prescription(s): acetaminophen , albuterol , alendronate , mintox maximum strength, atorvastatin , benzonatate, buspirone , vicks vaporub, cholecalciferol , cyanocobalamin , dextromethorphan -guaifenesin , diclofenac  sodium, docusate sodium , escitalopram , famotidine , ferrous sulfate , fluticasone , folic acid , gabapentin , gabapentin , lactulose , levothyroxine , lidocaine , meclizine , nystatin, nystatin cream, olopatadine hcl, omeprazole, optive, pantoprazole , phenazopyridine , psyllium, quetiapine , symbicort, trazodone , trazodone , and gemtesa. Her primarily concern today is the Neck Pain  Initial Vital Signs:  Pulse/HCG Rate: 67ECG Heart Rate: 62 Temp: 97.8 F (36.6 C) Resp: 16 BP: (!) 129/106 SpO2: 98 %  BMI: Estimated body mass index is 41.19 kg/m as calculated from the following:   Height as of this encounter: 5' 1 (1.549  m).   Weight as of 01/16/24: 218 lb (98.9 kg).  Risk Assessment: Allergies: Reviewed. She has no known allergies.  Allergy Precautions: None required Coagulopathies: Reviewed. None identified.  Blood-thinner therapy: None at this time Active Infection(s): Reviewed. None identified. Shelly Freeman is afebrile  Site Confirmation: Shelly Freeman was asked to confirm the procedure and laterality before marking the site Procedure checklist: Completed Consent: Before the procedure and under the influence of no sedative(s), amnesic(s), or anxiolytics, the patient was informed of the treatment options, risks and possible complications. To fulfill our ethical and  legal obligations, as recommended by the American Medical Association's Code of Ethics, I have informed the patient of my clinical impression; the nature and purpose of the treatment or procedure; the risks, benefits, and possible complications of the intervention; the alternatives, including doing nothing; the risk(s) and benefit(s) of the alternative treatment(s) or procedure(s); and the risk(s) and benefit(s) of doing nothing. The patient was provided information about the general risks and possible complications associated with the procedure. These may include, but are not limited to: failure to achieve desired goals, infection, bleeding, organ or nerve damage, allergic reactions, paralysis, and death. In addition, the patient was informed of those risks and complications associated to Spine-related procedures, such as failure to decrease pain; infection (i.e.: Meningitis, epidural or intraspinal abscess); bleeding (i.e.: epidural hematoma, subarachnoid hemorrhage, or any other type of intraspinal or peri-dural bleeding); organ or nerve damage (i.e.: Any type of peripheral nerve, nerve root, or spinal cord injury) with subsequent damage to sensory, motor, and/or autonomic systems, resulting in permanent pain, numbness, and/or weakness of one or  several areas of the body; allergic reactions; (i.e.: anaphylactic reaction); and/or death. Furthermore, the patient was informed of those risks and complications associated with the medications. These include, but are not limited to: allergic reactions (i.e.: anaphylactic or anaphylactoid reaction(s)); adrenal axis suppression; blood sugar elevation that in diabetics may result in ketoacidosis or comma; water retention that in patients with history of congestive heart failure may result in shortness of breath, pulmonary edema, and decompensation with resultant heart failure; weight gain; swelling or edema; medication-induced neural toxicity; particulate matter embolism and blood vessel occlusion with resultant organ, and/or nervous system infarction; and/or aseptic necrosis of one or more joints. Finally, the patient was informed that Medicine is not an exact science; therefore, there is also the possibility of unforeseen or unpredictable risks and/or possible complications that may result in a catastrophic outcome. The patient indicated having understood very clearly. We have given the patient no guarantees and we have made no promises. Enough time was given to the patient to ask questions, all of which were answered to the patient's satisfaction. Ms. Winward has indicated that she wanted to continue with the procedure. Attestation: I, the ordering provider, attest that I have discussed with the patient the benefits, risks, side-effects, alternatives, likelihood of achieving goals, and potential problems during recovery for the procedure that I have provided informed consent. Date  Time: 02/24/2024 10:47 AM  Pre-Procedure Preparation:  Monitoring: As per clinic protocol. Respiration, ETCO2, SpO2, BP, heart rate and rhythm monitor placed and checked for adequate function Safety Precautions: Patient was assessed for positional comfort and pressure points before starting the procedure. Time-out: I  initiated and conducted the Time-out before starting the procedure, as per protocol. The patient was asked to participate by confirming the accuracy of the Time Out information. Verification of the correct person, site, and procedure were performed and confirmed by me, the nursing staff, and the patient. Time-out conducted as per Joint Commission's Universal Protocol (UP.01.01.01). Time: 1127 Start Time: 1127 hrs.  Description  Narrative of Procedure:          Rationale (medical necessity): procedure needed and proper for the diagnosis and/or treatment of the patient's medical symptoms and needs. Start Time: 1127 hrs. Safety Precautions: Aspiration looking for blood return was conducted prior to all injections. At no point did we inject any substances, as a needle was being advanced. No attempts were made at seeking any paresthesias. Safe injection practices and needle disposal  techniques used. Medications properly checked for expiration dates. SDV (single dose vial) medications used. Description of procedure: Protocol guidelines were followed. The patient was assisted into a comfortable position. The target area was identified and the area prepped in the usual manner. Skin & deeper tissues infiltrated with local anesthetic. Appropriate amount of time allowed to pass for local anesthetics to take effect. Using fluoroscopic guidance, the epidural needle was introduced through the skin, ipsilateral to the reported pain, and advanced to the target area. Posterior laminar os was contacted and the needle walked caudad, until the lamina was cleared. The ligamentum flavum was engaged and the epidural space identified using "loss-of-resistance technique" with 2-3 ml of PF-NaCl (0.9% NSS), in a 5cc dedicated LOR syringe. (See Imaging guidance below for use of contrast details.) Once proper needle placement was secured, and negative aspiration confirmed, the solution was injected in intermittent fashion,  asking for systemic symptoms every 0.5cc. The needles were then removed and the area cleansed, making sure to leave some of the prepping solution back to take advantage of its long term bactericidal properties.  3 cc solution made of 1 cc of preservative-free saline,1 cc of 0.2% ropivacaine , 1 cc of Decadron  10 mg/cc.   Vitals:   02/24/24 1055 02/24/24 1130 02/24/24 1136  BP:  (!) 129/106 (!) 124/52  Pulse: 67    Resp: 16 15   Temp: 97.8 F (36.6 C)    SpO2: 98% 93%   Height: 5' 1 (1.549 m)       End Time: 1131 hrs.  Imaging Guidance (Spinal):          Type of Imaging Technique: Fluoroscopy Guidance (Spinal) Indication(s): Fluoroscopy guidance for needle placement to enhance accuracy in procedures requiring precise needle localization for targeted delivery of medication in or near specific anatomical locations not easily accessible without such real-time imaging assistance. Exposure Time: Please see nurses notes. Contrast: Before injecting any contrast, we confirmed that the patient did not have an allergy to iodine, shellfish, or radiological contrast. Once satisfactory needle placement was completed at the desired level, radiological contrast was injected. Contrast injected under live fluoroscopy. No contrast complications. See chart for type and volume of contrast used. Fluoroscopic Guidance: I was personally present during the use of fluoroscopy. Tunnel Vision Technique used to obtain the best possible view of the target area. Parallax error corrected before commencing the procedure. Direction-depth-direction technique used to introduce the needle under continuous pulsed fluoroscopy. Once target was reached, antero-posterior, oblique, and lateral fluoroscopic projection used confirm needle placement in all planes. Images permanently stored in EMR. Interpretation: I personally interpreted the imaging intraoperatively. Adequate needle placement confirmed in multiple planes. Appropriate  spread of contrast into desired area was observed. No evidence of afferent or efferent intravascular uptake. No intrathecal or subarachnoid spread observed. Permanent images saved into the patient's record.  Post-operative Assessment:  Post-procedure Vital Signs:  Pulse/HCG Rate: 6762 Temp: 97.8 F (36.6 C) Resp: 15 BP: (!) 124/52 SpO2: 93 %  EBL: None  Complications: No immediate post-treatment complications observed by team, or reported by patient.  Note: The patient tolerated the entire procedure well. A repeat set of vitals were taken after the procedure and the patient was kept under observation following institutional policy, for this type of procedure. Post-procedural neurological assessment was performed, showing return to baseline, prior to discharge. The patient was provided with post-procedure discharge instructions, including a section on how to identify potential problems. Should any problems arise concerning this procedure, the patient was  given instructions to immediately contact us , at any time, without hesitation. In any case, we plan to contact the patient by telephone for a follow-up status report regarding this interventional procedure.  Comments:  No additional relevant information.  Plan of Care (POC)  Orders:  Orders Placed This Encounter  Procedures   DG PAIN CLINIC C-ARM 1-60 MIN NO REPORT    Intraoperative interpretation by procedural physician at [facility].    Standing Status:   Standing    Number of Occurrences:   1    Reason for exam::   Assistance in needle guidance and placement for procedures requiring needle placement in or near specific anatomical locations not easily accessible without such assistance.     Medications ordered for procedure: Meds ordered this encounter  Medications   iohexol  (OMNIPAQUE ) 180 MG/ML injection 10 mL    Must be Myelogram-compatible. If not available, you may substitute with a water-soluble, non-ionic,  hypoallergenic, myelogram-compatible radiological contrast medium.   lidocaine  (XYLOCAINE ) 2 % (with pres) injection 400 mg   diazepam (VALIUM) tablet 5 mg    Make sure Flumazenil is available in the pyxis when using this medication. If oversedation occurs, administer 0.2 mg IV over 15 sec. If after 45 sec no response, administer 0.2 mg again over 1 min; may repeat at 1 min intervals; not to exceed 4 doses (1 mg)   ropivacaine  (PF) 2 mg/mL (0.2%) (NAROPIN ) injection 1 mL   sodium chloride  flush (NS) 0.9 % injection 1 mL   dexamethasone  (DECADRON ) injection 10 mg   Medications administered: We administered iohexol , lidocaine , diazepam, ropivacaine  (PF) 2 mg/mL (0.2%), sodium chloride  flush, and dexamethasone .  See the medical record for exact dosing, route, and time of administration.    Left cervical ESI, left L4, L5 transforaminal ESI 12/23/2023 Left cervical ESI #2: 02/24/24    Follow-up plan:   Return in about 7 weeks (around 04/13/2024) for PPE, VV, Seema.     Recent Visits Date Type Provider Dept  01/16/24 Office Visit Marcelino Nurse, MD [facility]  12/23/23 Procedure visit Marcelino Nurse, MD [facility]  12/05/23 Office Visit Marcelino Nurse, MD [facility]  Showing recent visits within past 90 days and meeting all other requirements Today's Visits Date Type Provider Dept  02/24/24 Procedure visit Marcelino Nurse, MD [facility]  Showing today's visits and meeting all other requirements Future Appointments No visits were found meeting these conditions. Showing future appointments within next 90 days and meeting all other requirements   Disposition: Discharge home  Discharge (Date  Time): 02/24/2024; 1140 hrs.   Primary Care Physician: Fleeta Pedro, Jill E, DO Location: [facility] Note by: Nurse Marcelino, MD (TTS technology used. I apologize for any typographical errors that were not detected and corrected.) Date:  02/24/2024; Time: 11:38 AM  Disclaimer:  Medicine is not an Visual merchandiser. The only guarantee in medicine is that nothing is guaranteed. It is important to note that the decision to proceed with this intervention was based on the information collected from the patient. The Data and conclusions were drawn from the patient's questionnaire, the interview, and the physical examination. Because the information was provided in large part by the patient, it cannot be guaranteed that it has not been purposely or unconsciously manipulated. Every effort has been made to obtain as much relevant data as possible for this evaluation. It is important to note that the conclusions that lead to this procedure are derived in large  part from the available data. Always take into account that the treatment will also be dependent on availability of resources and existing treatment guidelines, considered by other Pain Management Practitioners as being common knowledge and practice, at the time of the intervention. For Medico-Legal purposes, it is also important to point out that variation in procedural techniques and pharmacological choices are the acceptable norm. The indications, contraindications, technique, and results of the above procedure should only be interpreted and judged by a Board-Certified Interventional Pain Specialist with extensive familiarity and expertise in the same exact procedure and technique.

## 2024-02-24 NOTE — Progress Notes (Signed)
 Safety precautions to be maintained throughout the outpatient stay will include: orient to surroundings, keep bed in low position, maintain call bell within reach at all times, provide assistance with transfer out of bed and ambulation.

## 2024-02-24 NOTE — Patient Instructions (Signed)

## 2024-02-25 ENCOUNTER — Telehealth: Payer: Self-pay | Admitting: *Deleted

## 2024-02-25 NOTE — Telephone Encounter (Signed)
 Called for post procedure follow-up. Spoke with a Production designer, theatre/television/film at the facility in which she resides, will find out and call us  back.

## 2024-03-02 ENCOUNTER — Ambulatory Visit: Payer: Medicare (Managed Care) | Admitting: Urology

## 2024-04-10 ENCOUNTER — Telehealth: Payer: Self-pay

## 2024-04-10 DIAGNOSIS — G8929 Other chronic pain: Secondary | ICD-10-CM | POA: Insufficient documentation

## 2024-04-10 NOTE — Progress Notes (Unsigned)
 PROVIDER NOTE: Interpretation of information contained herein should be left to medically-trained personnel. Specific patient instructions are provided elsewhere under Patient Instructions section of medical record. This document was created in part using AI and STT-dictation technology, any transcriptional errors that may result from this process are unintentional.  Patient: Shelly Freeman  Service: E/M   PCP: Fleeta Pedro, Kate BRAVO, DO  DOB: 05-24-1950  DOS: 04/13/2024  Provider: Emmy MARLA Blanch, NP  MRN: [id number]  Delivery: Virtual Visit  Specialty: Interventional Pain Management  Type: Established Patient  Setting: Ambulatory outpatient facility  Specialty designation: 09  Referring Prov.: Fleeta Pedro, Kate BRAVO, DO  Location: Remote location       Virtual Encounter - Pain Management PROVIDER NOTE: Information contained herein reflects review and annotations entered in association with encounter. Interpretation of such information and data should be left to medically-trained personnel. Information provided to patient can be located elsewhere in the medical record under Patient Instructions. Document created using STT-dictation technology, any transcriptional errors that may result from process are unintentional.    Contact & Pharmacy Preferred: [phone] Home: [phone] (home) Mobile: [phone] (mobile) E-mail: No e-mail address on record  [facility] - [place], [place] - [address] [place] [address] Phone: [phone] Fax: [phone]   Pre-screening  Shelly Freeman offered in-person vs virtual encounter. She indicated preferring virtual for this encounter.   Reason COVID-19*  Social distancing based on CDC and AMA recommendations.   I contacted Shelly Freeman on 04/13/2024 via telephone.      I clearly identified myself as Emmy MARLA Blanch, NP. I verified that I was speaking with the correct person using two identifiers (Name: Shelly Freeman, and date of birth: March 20, 1951).  Consent I sought verbal advanced consent from Shelly Freeman for virtual visit interactions. I informed Shelly Freeman of possible security and privacy concerns, risks, and limitations associated with providing not-in-person medical evaluation and management services. I also informed Shelly Freeman of the availability of in-person appointments. Finally, I informed her that there would be a charge for the virtual visit and that she could be  personally, fully or partially, financially responsible for it. Shelly Freeman expressed understanding and agreed to proceed.   Historic Elements   Shelly Freeman is a 73 y.o. year old, female patient evaluated today after our last contact on Visit date not found. Shelly Freeman  has a past medical history of Acute respiratory failure with hypoxia [facility]), Age related osteoporosis, Angina at rest, Anginal pain, Arthritis, Asthma, CAD (coronary artery disease), Cerebral microvascular disease, Cervical disc disorder with radiculopathy, Chronic pain syndrome, COPD (chronic obstructive pulmonary disease) (HCC), DDD (degenerative disc disease), lumbar (05/14/2016), Depression, Diastolic dysfunction, Dizziness, Dyspnea, GERD (gastroesophageal reflux disease), Gram-negative bacteremia (E.coli) (01/26/2021), Heart valve problem, Hepatomegaly, Hiatal hernia, History of 2019 novel coronavirus disease (COVID-19), History of bilateral cataract extraction (11/2021), History of infection due to ESBL Escherichia coli, History of kidney stones, History of Roux-en-Y gastric bypass, HLD (hyperlipidemia), Hypertension, Hypothyroidism, IDA (iron deficiency anemia), Insomnia, Intentional opioid overdose (HCC), Ischemic heart disease due to coronary artery obstruction, Murmur, 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 She also  has a past surgical history that includes Gastric bypass; Appendectomy; Cesarean section; Tubal ligation; Colonoscopy with propofol  (N/A, 07/01/2017); Esophagogastroduodenoscopy (egd) with propofol  (N/A, 07/01/2017); Lithotripsy; Breast biopsy (Left); Total knee arthroplasty (Left, 10/25/2020); Colonoscopy (N/A, 08/24/2021); Cataract extraction w/PHACO (Left,  11/13/2021); Cataract extraction w/PHACO (Right, 12/04/2021); Lipoma excision (Left, 06/14/2022); IR INJECT DIAG/THERA/INC NEEDLE/CATH/PLC EPI/LUMB/SAC W/IMG (02/12/2023); Nissen fundoplication (N/A); Coronary angioplasty with stent; and Total knee arthroplasty (Right, 04/25/2023). Shelly Freeman has a current medication list which includes the following prescription(s): acetaminophen , albuterol , alendronate , mintox maximum strength, atorvastatin , benzonatate, buspirone , vicks vaporub, cholecalciferol , cyanocobalamin , dextromethorphan -guaifenesin , diclofenac  sodium, docusate sodium , escitalopram , famotidine , ferrous sulfate , fluticasone , folic acid , gabapentin , gabapentin , lactulose , levothyroxine , lidocaine , meclizine , nystatin, nystatin cream, olopatadine hcl, omeprazole, optive, pantoprazole , phenazopyridine , psyllium, quetiapine , symbicort, trazodone , trazodone , and gemtesa. She  reports that she has never smoked. She has never been exposed to tobacco smoke. She has never used smokeless tobacco. She reports that she does not drink alcohol  and does not use drugs. Shelly Freeman has no known allergies.  BMI: Estimated body mass index is 41.19 kg/m as calculated from the following:   Height as of 02/24/24: 5' 1 (1.549 m).   Weight as of 01/16/24: 218 lb (98.9 kg). Last encounter: Visit date not found. Last procedure: Visit date not found.  HPI  Today, she is being contacted for a post-procedure assessment.   Procedure Type: Cervical Epidural Steroid injection (CESI) (Interlaminar) #2  Laterality: Left (-LT)  Level:  C7-T1 DOS: 02/24/2024  Provider: Wallie Sherry, MD Imaging: Fluoroscopy-guided Spinal ([id number]) Anesthesia: Local anesthesia (1-2% Lidocaine ) Sedation: Valium  5 mg PO   Medical Necessity: Cervical Radicular pain Purpose: Diagnostic/Therapeutic Rationale (medical necessity): procedure needed and proper for the diagnosis and/or treatment of Shelly Freeman's medical symptoms and needs. Indications: Cervicalgia, cervical radicular pain, degenerative disc disease, severe enough to impact quality of life or function.   NAS-11 Pain score:        Pre-procedure: 5 /10        Post-procedure: 0-No pain/10   Post-Procedure Evaluation   Effectiveness:  Initial hour after procedure:   ***. Subsequent 4-6 hours post-procedure:   ***. Analgesia past initial 6 hours:   ***. Ongoing improvement:  Analgesic:  *** Function:    ***    ROM:    ***    Interpretation: ***   Pharmacotherapy Assessment  Monitoring: [place] PMP: PDMP not reviewed this encounter.       Pharmacotherapy: No side-effects or adverse reactions reported. Compliance: No problems identified. Effectiveness: Clinically acceptable. Plan: Refer to POC.  UDS: No results found for: SUMMARY No results found for: CBDTHCR, KATHLYNE, D9THCCBX  Laboratory Chemistry Profile   Renal Lab Results  Component Value Date   BUN 21 10/15/2023   CREATININE 1.04 (H) 10/15/2023   BCR 16 11/09/2022   GFRAA >60 11/15/2019   GFRNONAA 57 (L) 10/15/2023    Hepatic Lab Results  Component Value Date   AST 33 10/12/2023   ALT 31 10/12/2023   ALBUMIN 4.0 10/12/2023   ALKPHOS 98 10/12/2023   LIPASE 30 10/12/2023   AMMONIA 17 04/10/2016    Electrolytes Lab Results  Component Value Date   NA 139 10/15/2023   K 3.7 10/15/2023   CL 105 10/15/2023   CALCIUM  9.2 10/15/2023   MG 2.3 10/15/2023   PHOS 5.0 (H) 10/15/2023    Bone No results found for: VD25OH, VD125OH2TOT, CI6874NY7, CI7874NY7, 25OHVITD1, 25OHVITD2,  25OHVITD3, TESTOFREE, TESTOSTERONE  Inflammation (CRP: Acute Phase) (ESR: Chronic Phase) Lab Results  Component Value Date   CRP 13.8 (H) 01/29/2019   ESRSEDRATE 28 01/29/2019   LATICACIDVEN 1.8 10/12/2023         Note: Above Lab results reviewed.  Imaging  DG PAIN CLINIC C-ARM 1-60 MIN NO REPORT Fluoro was used, but  no Radiologist interpretation will be provided.  Please refer to NOTES tab for provider progress note.  Assessment  The primary encounter diagnosis was Cervical radicular pain. Diagnoses of Chronic pain syndrome, Chronic radicular lumbar pain, Lumbar radiculopathy, and Cervical facet joint syndrome were also pertinent to this visit.  Plan of Care  Problem-specific:  No problem-specific Assessment & Plan notes found for this encounter.  Shelly Freeman has a current medication list which includes the following long-term medication(s): atorvastatin , escitalopram , ferrous sulfate , fluticasone , gabapentin , gabapentin , levothyroxine , and omeprazole.  Pharmacotherapy (Medications Ordered): No orders of the defined types were placed in this encounter.  Orders:  No orders of the defined types were placed in this encounter.  Follow-up plan:   No follow-ups on file.          Recent Visits Date Type Provider Dept  02/24/24 Procedure visit Marcelino Nurse, MD [facility]  01/16/24 Office Visit Marcelino Nurse, MD [facility]  Showing recent visits within past 90 days and meeting all other requirements Future Appointments Date Type Provider Dept  04/13/24 Office Visit Wah Sabic K, NP [facility]  Showing future appointments within next 90 days and meeting all other requirements  I discussed the assessment and treatment plan with the patient. The patient was provided an opportunity to ask questions and all were answered. The patient agreed with the plan and demonstrated an understanding of the instructions.  Patient advised  to call back or seek an in-person evaluation if the symptoms or condition worsens.  Duration of encounter: *** minutes.  Note by: Emmy MARLA Blanch, NP Date: 04/13/2024; Time: 4:24 PM

## 2024-04-10 NOTE — Telephone Encounter (Signed)
 Attempted to call for pre virtual app questions.  No answer.

## 2024-04-13 ENCOUNTER — Ambulatory Visit: Attending: Nurse Practitioner | Admitting: Nurse Practitioner

## 2024-04-13 ENCOUNTER — Telehealth: Payer: Self-pay | Admitting: *Deleted

## 2024-04-13 DIAGNOSIS — M5416 Radiculopathy, lumbar region: Secondary | ICD-10-CM

## 2024-04-13 DIAGNOSIS — M5412 Radiculopathy, cervical region: Secondary | ICD-10-CM

## 2024-04-13 DIAGNOSIS — M47812 Spondylosis without myelopathy or radiculopathy, cervical region: Secondary | ICD-10-CM

## 2024-04-13 DIAGNOSIS — Z91199 Patient's noncompliance with other medical treatment and regimen due to unspecified reason: Secondary | ICD-10-CM

## 2024-04-13 DIAGNOSIS — G894 Chronic pain syndrome: Secondary | ICD-10-CM

## 2024-04-13 DIAGNOSIS — G8929 Other chronic pain: Secondary | ICD-10-CM

## 2024-04-24 NOTE — Progress Notes (Signed)
 [facility] ULTRASOUND GUIDED PROCEDURE VISIT  The patient presents for ultrasound-guided right distal IT band steroid injection at the referral of Arthea Sheer, MD.  She was last evaluated by Dr. Sheer on 04/24/2024 (today).  She is coming in for follow-up 1 year status post right total knee arthroplasty.  She gets some pain around her anterior/lateral proximal tibia still.  On evaluation, she did not have any evidence of infection and good range of motion of the knee without laxity.  There was some tenderness to palpation over the distal IT band and Gertie's tubercle.  X-ray of the knee showed appropriate alignment of total knee arthroplasty without evidence of loosening.  Based on her history, physical, imaging, she was suspected to have distal IT band tendinitis and recommended diagnostic/therapeutic steroid injection as well as physical therapy.  She can continue with walker, ice/compression/elevation, topical pain creams as well.  RIGHT Ultrasound guided Distal IT Band Tendon Sheath Injection   Consent After discussing the various treatment options for the condition,  It was agreed that a corticosteroid injection would be the next step in treatment.  The nature of and the indications for a corticosteroid and / or local anaesthetic injection were reviewed in detail with the patient today.  The inherent risks of injection including infection, allergic reaction, increased pain, incomplete relief or temporary relief of symptoms, alterations of blood glucose levels requiring careful monitoring and treatment as indicated, tendon, ligament or articular cartilage rupture or degeneration, nerve injury, skin depigmentation, and/or fatty atrophy were discussed.    Indication for Ultrasound Diagnostic injection where accurate injectate placement is critical for diagnosis. Inability to precisely localize the target using palpation or surface landmarks due to body  habitus.   Procedure After the risks and benefits of the procedure were explained, consent was given, and time-out was performed.  The right lateral knee and surrounding structures were visualized with ultrasound. There was mild enlargement to the distal IT band attachment.  The site for the injection was properly marked and prepped with Chlorhexadine/Isopropyl alcohol  solution.      The injection site was anesthetized with ethyl chloride. Using ultrasound guidance, the right distal IT band tendon was visualized and injected with 3 milligrams of Betamethasone, 1 milliliters of 0.25% Bupivacaine , and 1 milliliters of 1% Lidocaine  using a sterile technique and a 22 gauge 1.5 inch needle. During injection, there was unrestricted flow and care was taken not to inject corticosteroid into the skin or subcutaneous tissues.    A sterile band-aide was applied.  Post-injection instructions were given regarding post-procedure care, when to follow up in clinic and what to expect from the procedure.  The patient tolerated the injection well and was discharged without complication.       Contact our office with any questions or concerns.  Follow up as indicated, or sooner should any new problems arise, if conditions worsen, or if they are otherwise concerned.     Prentice Reges, DO [facility] [address] [place], [place] [address] Phone: [phone]   This note was generated in part with voice recognition software and I apologize for any typographical errors that were not detected and corrected.  Knee / Lower Extremity Injection/Arthrocentesis: Distal IT Band  Date/Time: 04/24/2024 11:30 AM  Performed by: Reges Prentice, DO Authorized by: Reges Prentice, DO   Procedure discussed: discussed risks, benefits, and alternatives   Consent Given by:  Patient Timeout: timeout called immediately prior to procedure  Prep: patient was prepped and draped in  usual sterile fashion   Indications:  Pain Needle Size:  22 G Guidance: ultrasound   Medications:  1 mL BUPivacaine  HCl 0.25 %; 1 mL lidocaine  1 %; 3 mg betamethasone acetate-betamethasone sodium phosphate  6 mg/mL Outcome:  Tolerated well, no immediate complications

## 2024-05-17 NOTE — Progress Notes (Unsigned)
 PROVIDER NOTE: Interpretation of information contained herein should be left to medically-trained personnel. Specific patient instructions are provided elsewhere under Patient Instructions section of medical record. This document was created in part using AI and STT-dictation technology, any transcriptional errors that may result from this process are unintentional.  Patient: Shelly Freeman  Service: E/M   PCP: Shelly Freeman  DOB: Apr 01, 1951  DOS: 05/18/2024  Provider: Emmy MARLA Blanch, Freeman  MRN: [id number]  Delivery: Virtual Visit  Specialty: Interventional Pain Management  Type: Established Patient  Setting: Ambulatory outpatient facility  Specialty designation: 09  Referring Prov.: Shelly Freeman  Location: Remote location       Virtual Encounter - Pain Management PROVIDER NOTE: Information contained herein reflects review and annotations entered in association with encounter. Interpretation of such information and data should be left to medically-trained personnel. Information provided to patient can be located elsewhere in the medical record under Patient Instructions. Document created using STT-dictation technology, any transcriptional errors that may result from process are unintentional.    Contact & Pharmacy Preferred: [phone] Home: [phone] (home) Mobile: [phone] (mobile) E-mail: No e-mail address on record  [facility] - [place], [place] - [address] [place] [address] Phone: [phone] Fax: [phone]   Pre-screening  Shelly Freeman offered in-person vs virtual encounter. She indicated preferring virtual for this encounter.   Reason COVID-19*  Social distancing based on CDC and AMA recommendations.   I contacted Shelly Freeman on 05/18/2024 via telephone.      I clearly identified myself as Shelly Freeman. I verified that I was speaking with the correct person using two identifiers (Name: Shelly Freeman, and date of birth: April 09, 1951).  Consent I sought verbal advanced consent from Shelly Freeman for virtual visit interactions. I informed Shelly Freeman of possible security and privacy concerns, risks, and limitations associated with providing not-in-person medical evaluation and management services. I also informed Shelly Freeman of the availability of in-person appointments. Finally, I informed her that there would be a charge for the virtual visit and that she could be  personally, fully or partially, financially responsible for it. Shelly Freeman expressed understanding and agreed to proceed.   Historic Elements   Shelly Freeman is a 74 y.o. year old, female patient evaluated today after our last contact on Visit date not found. Shelly Freeman  has a past medical history of Acute respiratory failure with hypoxia [facility]), Age related osteoporosis, Angina at rest, Anginal pain, Arthritis, Asthma, CAD (coronary artery disease), Cerebral microvascular disease, Cervical disc disorder with radiculopathy, Chronic pain syndrome, COPD (chronic obstructive pulmonary disease) (HCC), DDD (degenerative disc disease), lumbar (05/14/2016), Depression, Diastolic dysfunction, Dizziness, Dyspnea, GERD (gastroesophageal reflux disease), Gram-negative bacteremia (E.coli) (01/26/2021), Heart valve problem, Hepatomegaly, Hiatal hernia, History of 2019 novel coronavirus disease (COVID-19), History of bilateral cataract extraction (11/2021), History of infection due to ESBL Escherichia coli, History of kidney stones, History of Roux-en-Y gastric bypass, HLD (hyperlipidemia), Hypertension, Hypothyroidism, IDA (iron deficiency anemia), Insomnia, Intentional opioid overdose (HCC), Ischemic heart disease due to coronary artery obstruction, Murmur, 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 She also  has a past surgical history that includes Gastric bypass; Appendectomy; Cesarean section; Tubal ligation; Colonoscopy with propofol  (N/A, 07/01/2017); Esophagogastroduodenoscopy (egd) with propofol  (N/A, 07/01/2017); Lithotripsy; Breast biopsy (Left); Total knee arthroplasty (Left, 10/25/2020); Colonoscopy (N/A, 08/24/2021); Cataract extraction w/PHACO (Left,  11/13/2021); Cataract extraction w/PHACO (Right, 12/04/2021); Lipoma excision (Left, 06/14/2022); IR INJECT DIAG/THERA/INC NEEDLE/CATH/PLC EPI/LUMB/SAC W/IMG (02/12/2023); Nissen fundoplication (N/A); Coronary angioplasty with stent; and Total knee arthroplasty (Right, 04/25/2023). Shelly Freeman has a current medication list which includes the following prescription(s): acetaminophen , albuterol , alendronate , mintox maximum strength, atorvastatin , benzonatate, buspirone , vicks vaporub, cholecalciferol , cyanocobalamin , dextromethorphan -guaifenesin , diclofenac  sodium, docusate sodium , escitalopram , famotidine , ferrous sulfate , fluticasone , folic acid , gabapentin , gabapentin , lactulose , levothyroxine , lidocaine , meclizine , nystatin, nystatin cream, olopatadine hcl, omeprazole, optive, pantoprazole , phenazopyridine , psyllium, quetiapine , symbicort, trazodone , trazodone , and gemtesa. She  reports that she has never smoked. She has never been exposed to tobacco smoke. She has never used smokeless tobacco. She reports that she does not drink alcohol  and does not use drugs. Shelly Freeman has no known allergies.  BMI: Estimated body mass index is 41.19 kg/m as calculated from the following:   Height as of 02/24/24: 5' 1 (1.549 m).   Weight as of 01/16/24: 218 lb (98.9 kg). Last encounter: Visit date not found. Last procedure: Visit date not found.  HPI  Today, she is being contacted for  ***     Procedure Type: Cervical Epidural Steroid injection (CESI) (Interlaminar) #2  Laterality: Left (-LT)  Level: C7-T1 DOS: 02/24/2024   Provider: Wallie Sherry, MD Imaging: Fluoroscopy-guided Spinal ([id number]) Anesthesia: Local anesthesia (1-2% Lidocaine ) Sedation: Valium  5 mg PO   Medical Necessity: Cervical Radicular pain Purpose: Diagnostic/Therapeutic Rationale (medical necessity): procedure needed and proper for the diagnosis and/or treatment of Ms. Habig's medical symptoms and needs. Indications: Cervicalgia, cervical radicular pain, degenerative disc disease, severe enough to impact quality of life or function.   NAS-11 Pain score:        Pre-procedure: 5 /10        Post-procedure: 0-No pain/10  Post-Procedure Evaluation  {There is no content from the last Procedure section.}   Effectiveness:  Initial hour after procedure:   ***. Subsequent 4-6 hours post-procedure:   ***. Analgesia past initial 6 hours:   ***. Ongoing improvement:  Analgesic:  *** Function: {Blank single:19197::No benefit,No improvement,Back to baseline,Transient improvement,Ms. Flight reports improvement in function,Somewhat improved,Minimal improvement,   ***   } ROM: {Blank single:19197::No benefit,No improvement,Back to baseline,Transient improvement,Ms. Cruzen reports improvement in ROM,Somewhat improved,Minimal improvement,   ***   } Interpretation: ***    Pharmacotherapy Assessment {There is no content from the last Subjective section.}  Monitoring: [place] PMP: PDMP not reviewed this encounter.       Pharmacotherapy: No side-effects or adverse reactions reported. Compliance: No problems identified. Effectiveness: Clinically acceptable. Plan: Refer to POC.  UDS: No results found for: SUMMARY No results found for: CBDTHCR, KATHLYNE, [id number]  Laboratory Chemistry Profile   Renal Lab Results  Component Value Date   BUN 21 10/15/2023   CREATININE 1.04 (H) 10/15/2023   BCR 16 11/09/2022   GFRAA >60 11/15/2019   GFRNONAA 57 (L) 10/15/2023    Hepatic Lab Results  Component  Value Date   AST 33 10/12/2023   ALT 31 10/12/2023   ALBUMIN 4.0 10/12/2023   ALKPHOS 98 10/12/2023   LIPASE 30 10/12/2023   AMMONIA 17 04/10/2016    Electrolytes Lab Results  Component Value Date   NA 139 10/15/2023   K 3.7 10/15/2023   CL 105 10/15/2023   CALCIUM  9.2 10/15/2023   MG 2.3 10/15/2023   PHOS 5.0 (H) 10/15/2023    Bone No results found for: VD25OH, VD125OH2TOT, CI6874NY7, CI7874NY7, 25OHVITD1, 25OHVITD2, 25OHVITD3, TESTOFREE, TESTOSTERONE  Inflammation (CRP: Acute Phase) (ESR: Chronic Phase) Lab Results  Component  Value Date   CRP 13.8 (H) 01/29/2019   ESRSEDRATE 28 01/29/2019   LATICACIDVEN 1.8 10/12/2023         Note: Above Lab results reviewed.  Imaging  DG PAIN CLINIC C-ARM 1-60 MIN NO REPORT Fluoro was used, but no Radiologist interpretation will be provided.  Please refer to NOTES tab for provider progress note.  Assessment  There were no encounter diagnoses.  Plan of Care  Problem-specific:  No problem-specific Assessment & Plan notes found for this encounter.  Ms. Chelbi Hadley has a current medication list which includes the following long-term medication(s): atorvastatin , escitalopram , ferrous sulfate , fluticasone , gabapentin , gabapentin , levothyroxine , and omeprazole.  Pharmacotherapy (Medications Ordered): No orders of the defined types were placed in this encounter.  Orders:  No orders of the defined types were placed in this encounter.  Follow-up plan:   No follow-ups on file.      {There is no content from the last Plan section.}    Recent Visits Date Type Provider Dept  02/24/24 Procedure visit Marcelino Nurse, MD [facility]  Showing recent visits within past 90 days and meeting all other requirements Future Appointments Date Type Provider Dept  05/18/24 Appointment Aidyn Sportsman K, Freeman [facility]  Showing future appointments within next 90 days and meeting all other  requirements  I discussed the assessment and treatment plan with the patient. The patient was provided an opportunity to ask questions and all were answered. The patient agreed with the plan and demonstrated an understanding of the instructions.  Patient advised to call back or seek an in-person evaluation if the symptoms or condition worsens.  Duration of encounter: *** minutes.  Note by: Shelly Freeman Date: 05/18/2024; Time: 7:55 PM

## 2024-05-18 ENCOUNTER — Ambulatory Visit: Admitting: Nurse Practitioner

## 2024-05-18 DIAGNOSIS — Z91199 Patient's noncompliance with other medical treatment and regimen due to unspecified reason: Secondary | ICD-10-CM

## 2024-05-18 DIAGNOSIS — G894 Chronic pain syndrome: Secondary | ICD-10-CM

## 2024-06-02 ENCOUNTER — Ambulatory Visit: Admitting: Nurse Practitioner

## 2024-06-18 ENCOUNTER — Ambulatory Visit: Admitting: Nurse Practitioner
